# Patient Record
Sex: Male | Born: 1960 | Race: White | Hispanic: No | Marital: Married | Smoking: Never smoker
Health system: Southern US, Community
[De-identification: ages and names within clinical notes are randomized; demographics above are authoritative.]

## PROBLEM LIST (undated history)

## (undated) DIAGNOSIS — I2119 ST elevation (STEMI) myocardial infarction involving other coronary artery of inferior wall: Secondary | ICD-10-CM

## (undated) DIAGNOSIS — I2510 Atherosclerotic heart disease of native coronary artery without angina pectoris: Secondary | ICD-10-CM

## (undated) DIAGNOSIS — I10 Essential (primary) hypertension: Secondary | ICD-10-CM

## (undated) DIAGNOSIS — E785 Hyperlipidemia, unspecified: Secondary | ICD-10-CM

## (undated) DIAGNOSIS — E781 Pure hyperglyceridemia: Secondary | ICD-10-CM

## (undated) DIAGNOSIS — G510 Bell's palsy: Secondary | ICD-10-CM

## (undated) DIAGNOSIS — E118 Type 2 diabetes mellitus with unspecified complications: Secondary | ICD-10-CM

## (undated) DIAGNOSIS — Z9861 Coronary angioplasty status: Secondary | ICD-10-CM

## (undated) DIAGNOSIS — Q230 Congenital stenosis of aortic valve: Secondary | ICD-10-CM

## (undated) DIAGNOSIS — Q231 Congenital insufficiency of aortic valve: Secondary | ICD-10-CM

## (undated) HISTORY — DX: Type 2 diabetes mellitus with unspecified complications: E11.8

## (undated) HISTORY — PX: TRANSTHORACIC ECHOCARDIOGRAM: SHX275

## (undated) HISTORY — DX: Coronary angioplasty status: Z98.61

## (undated) HISTORY — DX: Atherosclerotic heart disease of native coronary artery without angina pectoris: I25.10

## (undated) HISTORY — DX: ST elevation (STEMI) myocardial infarction involving other coronary artery of inferior wall: I21.19

## (undated) HISTORY — PX: KNEE ARTHROSCOPY W/ ACL RECONSTRUCTION: SHX1858

---

## 2009-11-25 ENCOUNTER — Emergency Department (HOSPITAL_COMMUNITY): Admission: EM | Admit: 2009-11-25 | Discharge: 2009-11-25 | Payer: Self-pay | Admitting: Emergency Medicine

## 2014-04-05 ENCOUNTER — Ambulatory Visit (INDEPENDENT_AMBULATORY_CARE_PROVIDER_SITE_OTHER): Payer: Self-pay | Admitting: Internal Medicine

## 2014-04-05 DIAGNOSIS — Z9189 Other specified personal risk factors, not elsewhere classified: Secondary | ICD-10-CM

## 2014-04-05 DIAGNOSIS — A09 Infectious gastroenteritis and colitis, unspecified: Secondary | ICD-10-CM

## 2014-04-05 DIAGNOSIS — Z23 Encounter for immunization: Secondary | ICD-10-CM

## 2014-04-05 MED ORDER — ATOVAQUONE-PROGUANIL HCL 250-100 MG PO TABS: 1.0000 | ORAL_TABLET | Freq: Every day | ORAL | Status: DC

## 2014-04-05 MED ORDER — AZITHROMYCIN 500 MG PO TABS: 500.0000 mg | ORAL_TABLET | Freq: Every day | ORAL | Status: DC

## 2014-04-05 NOTE — Progress Notes (Signed)
   Subjective:    Patient ID: Dennis Bates, male    DOB: 11-Nov-1960, 53 y.o.   MRN: [id number]  HPI 53yo M who has recent diagnosis of diabetes and hypertriglyceridemia is going on a mission trip from July 31 to aug 11th to [place], in [place] building chapel and providing ? Medical care in village. Sleeping on house boat in hammock with mosquito netting.  Previous travel [place] in [place], has had hep a and hep b in the past, but no flu vaccine in the last year. Roughly 6250yrs since tetanus  Meds: metformin Pmhx:  Dm, hyper TG, heart murmur, and knee surgeries   Review of Systems     Objective:   Physical Exam Not needed for visit     Assessment & Plan:  Pre-travel vaccine =  Will give yellow fever, tetanus and typhoid vaccine  Malaria proph = gave rx for malarone #21 and instructions for mosquito repellant  Traveler's diarrhea = gave precautions and rx for azithromycin if needed

## 2014-09-13 DIAGNOSIS — Q230 Congenital stenosis of aortic valve: Secondary | ICD-10-CM

## 2014-09-13 DIAGNOSIS — I350 Nonrheumatic aortic (valve) stenosis: Secondary | ICD-10-CM

## 2014-09-13 HISTORY — DX: Congenital stenosis of aortic valve: Q23.0

## 2014-09-13 HISTORY — DX: Nonrheumatic aortic (valve) stenosis: I35.0

## 2014-09-20 ENCOUNTER — Other Ambulatory Visit: Payer: Self-pay

## 2014-09-20 ENCOUNTER — Inpatient Hospital Stay (HOSPITAL_COMMUNITY)
Admission: EM | Admit: 2014-09-20 | Discharge: 2014-09-23 | DRG: 247 | Disposition: A | Discharge status: Home / Self Care | Payer: Self-pay | Source: Ambulatory Visit | Attending: Cardiology | Admitting: Cardiology

## 2014-09-20 ENCOUNTER — Encounter (HOSPITAL_COMMUNITY): Payer: Self-pay | Admitting: Physician Assistant

## 2014-09-20 ENCOUNTER — Encounter (HOSPITAL_COMMUNITY)
Admission: EM | Disposition: A | Discharge status: Home / Self Care | Payer: MEDICAID | Source: Ambulatory Visit | Attending: Cardiology

## 2014-09-20 DIAGNOSIS — I2119 ST elevation (STEMI) myocardial infarction involving other coronary artery of inferior wall: Principal | ICD-10-CM | POA: Diagnosis present

## 2014-09-20 DIAGNOSIS — I350 Nonrheumatic aortic (valve) stenosis: Secondary | ICD-10-CM | POA: Diagnosis present

## 2014-09-20 DIAGNOSIS — E785 Hyperlipidemia, unspecified: Secondary | ICD-10-CM | POA: Diagnosis present

## 2014-09-20 DIAGNOSIS — E1165 Type 2 diabetes mellitus with hyperglycemia: Secondary | ICD-10-CM | POA: Diagnosis present

## 2014-09-20 DIAGNOSIS — E118 Type 2 diabetes mellitus with unspecified complications: Secondary | ICD-10-CM

## 2014-09-20 DIAGNOSIS — I2510 Atherosclerotic heart disease of native coronary artery without angina pectoris: Secondary | ICD-10-CM | POA: Diagnosis present

## 2014-09-20 DIAGNOSIS — I213 ST elevation (STEMI) myocardial infarction of unspecified site: Secondary | ICD-10-CM

## 2014-09-20 DIAGNOSIS — E119 Type 2 diabetes mellitus without complications: Secondary | ICD-10-CM | POA: Diagnosis present

## 2014-09-20 DIAGNOSIS — E1169 Type 2 diabetes mellitus with other specified complication: Secondary | ICD-10-CM | POA: Diagnosis present

## 2014-09-20 DIAGNOSIS — Q230 Congenital stenosis of aortic valve: Secondary | ICD-10-CM

## 2014-09-20 DIAGNOSIS — Z8679 Personal history of other diseases of the circulatory system: Secondary | ICD-10-CM

## 2014-09-20 DIAGNOSIS — I2121 ST elevation (STEMI) myocardial infarction involving left circumflex coronary artery: Secondary | ICD-10-CM

## 2014-09-20 DIAGNOSIS — I359 Nonrheumatic aortic valve disorder, unspecified: Secondary | ICD-10-CM

## 2014-09-20 DIAGNOSIS — Z9861 Coronary angioplasty status: Secondary | ICD-10-CM

## 2014-09-20 DIAGNOSIS — Q231 Congenital insufficiency of aortic valve: Secondary | ICD-10-CM

## 2014-09-20 DIAGNOSIS — E781 Pure hyperglyceridemia: Secondary | ICD-10-CM | POA: Diagnosis present

## 2014-09-20 DIAGNOSIS — Q2381 Bicuspid aortic valve: Secondary | ICD-10-CM

## 2014-09-20 HISTORY — DX: Hyperlipidemia, unspecified: E78.5

## 2014-09-20 HISTORY — PX: PERCUTANEOUS CORONARY STENT INTERVENTION (PCI-S): SHX6016

## 2014-09-20 HISTORY — PX: LEFT HEART CATHETERIZATION WITH CORONARY ANGIOGRAM: SHX5451

## 2014-09-20 HISTORY — DX: Coronary angioplasty status: Z98.61

## 2014-09-20 HISTORY — DX: Bell's palsy: G51.0

## 2014-09-20 HISTORY — DX: Atherosclerotic heart disease of native coronary artery without angina pectoris: I25.10

## 2014-09-20 HISTORY — DX: Congenital stenosis of aortic valve: Q23.0

## 2014-09-20 HISTORY — DX: Pure hyperglyceridemia: E78.1

## 2014-09-20 HISTORY — DX: ST elevation (STEMI) myocardial infarction involving other coronary artery of inferior wall: I21.19

## 2014-09-20 HISTORY — DX: Congenital insufficiency of aortic valve: Q23.1

## 2014-09-20 LAB — COMPREHENSIVE METABOLIC PANEL
ALT: 35 U/L (ref 0–53)
AST: 42 U/L — ABNORMAL HIGH (ref 0–37)
Albumin: 4.1 g/dL (ref 3.5–5.2)
Alkaline Phosphatase: 35 U/L — ABNORMAL LOW (ref 39–117)
Anion gap: 12 (ref 5–15)
BUN: 7 mg/dL (ref 6–23)
CO2: 19 mmol/L (ref 19–32)
Calcium: 9 mg/dL (ref 8.4–10.5)
Chloride: 100 mEq/L (ref 96–112)
Creatinine, Ser: 0.85 mg/dL (ref 0.50–1.35)
GFR calc Af Amer: >90 mL/min (ref >90)
GFR calc non Af Amer: >90 mL/min (ref >90)
Glucose, Bld: 258 mg/dL — ABNORMAL HIGH (ref 70–99)
Potassium: 3.3 mmol/L — ABNORMAL LOW (ref 3.5–5.1)
Sodium: 131 mmol/L — ABNORMAL LOW (ref 135–145)
Total Bilirubin: 1.4 mg/dL — ABNORMAL HIGH (ref 0.3–1.2)
Total Protein: 6.7 g/dL (ref 6.0–8.3)

## 2014-09-20 LAB — HEMOGLOBIN A1C
Hgb A1c MFr Bld: 9.1 % — ABNORMAL HIGH (ref <5.7)
Mean Plasma Glucose: 214 mg/dL — ABNORMAL HIGH (ref <117)

## 2014-09-20 LAB — LIPID PANEL
Cholesterol: 214 mg/dL — ABNORMAL HIGH (ref 0–200)
HDL: 28 mg/dL — ABNORMAL LOW (ref >39)
LDL Cholesterol: UNDETERMINED mg/dL (ref 0–99)
Total CHOL/HDL Ratio: 7.6 RATIO
Triglycerides: 879 mg/dL — ABNORMAL HIGH (ref <150)
VLDL: UNDETERMINED mg/dL (ref 0–40)

## 2014-09-20 LAB — PROTIME-INR
INR: 1.06 (ref 0.00–1.49)
Prothrombin Time: 13.9 seconds (ref 11.6–15.2)

## 2014-09-20 LAB — CBC
HCT: 39.4 % (ref 39.0–52.0)
Hemoglobin: 14.4 g/dL (ref 13.0–17.0)
MCH: 29.9 pg (ref 26.0–34.0)
MCHC: 36.5 g/dL — ABNORMAL HIGH (ref 30.0–36.0)
MCV: 81.7 fL (ref 78.0–100.0)
Platelets: 239 10*3/uL (ref 150–400)
RBC: 4.82 MIL/uL (ref 4.22–5.81)
RDW: 12.5 % (ref 11.5–15.5)
WBC: 9.7 10*3/uL (ref 4.0–10.5)

## 2014-09-20 LAB — POCT I-STAT, CHEM 8
BUN: 7 mg/dL (ref 6–23)
Calcium, Ion: 1.13 mmol/L (ref 1.12–1.23)
Chloride: 110 mEq/L (ref 96–112)
Creatinine, Ser: 0.6 mg/dL (ref 0.50–1.35)
Glucose, Bld: 279 mg/dL — ABNORMAL HIGH (ref 70–99)
HCT: 41 % (ref 39.0–52.0)
Hemoglobin: 13.9 g/dL (ref 13.0–17.0)
Potassium: 3.4 mmol/L — ABNORMAL LOW (ref 3.5–5.1)
Sodium: 130 mmol/L — ABNORMAL LOW (ref 135–145)
TCO2: 18 mmol/L (ref 0–100)

## 2014-09-20 LAB — CK TOTAL AND CKMB (NOT AT ARMC)
CK, MB: 5.1 ng/mL — ABNORMAL HIGH (ref 0.3–4.0)
Relative Index: INVALID (ref 0.0–2.5)
Total CK: 83 U/L (ref 7–232)

## 2014-09-20 LAB — GLUCOSE, CAPILLARY
Glucose-Capillary: 199 mg/dL — ABNORMAL HIGH (ref 70–99)
Glucose-Capillary: 176 mg/dL — ABNORMAL HIGH (ref 70–99)

## 2014-09-20 LAB — TROPONIN I
Troponin I: 0.08 ng/mL — ABNORMAL HIGH (ref <0.031)
Troponin I: 36.92 ng/mL (ref <0.031)
Troponin I: 29.62 ng/mL (ref <0.031)

## 2014-09-20 LAB — APTT: aPTT: 30 seconds (ref 24–37)

## 2014-09-20 LAB — MRSA PCR SCREENING: MRSA by PCR: NEGATIVE

## 2014-09-20 LAB — TSH: TSH: 3.113 u[IU]/mL (ref 0.350–4.500)

## 2014-09-20 LAB — POCT ACTIVATED CLOTTING TIME: Activated Clotting Time: 454 seconds

## 2014-09-20 SURGERY — LEFT HEART CATHETERIZATION WITH CORONARY ANGIOGRAM
Anesthesia: LOCAL

## 2014-09-20 MED ORDER — MIDAZOLAM HCL 2 MG/2ML IJ SOLN
INTRAMUSCULAR | Status: AC
  Filled 2014-09-20: qty 2

## 2014-09-20 MED ORDER — CARVEDILOL 3.125 MG PO TABS
3.1250 mg | ORAL_TABLET | Freq: Two times a day (BID) | ORAL | Status: DC
  Administered 2014-09-20 – 2014-09-23 (×6): 3.125 mg via ORAL
  Filled 2014-09-20 (×8): qty 1

## 2014-09-20 MED ORDER — PRASUGREL HCL 10 MG PO TABS
ORAL_TABLET | ORAL | Status: AC
  Filled 2014-09-20: qty 6

## 2014-09-20 MED ORDER — HEPARIN (PORCINE) IN NACL 2-0.9 UNIT/ML-% IJ SOLN
INTRAMUSCULAR | Status: AC
  Filled 2014-09-20: qty 1500

## 2014-09-20 MED ORDER — FENTANYL CITRATE 0.05 MG/ML IJ SOLN
INTRAMUSCULAR | Status: AC
  Filled 2014-09-20: qty 2

## 2014-09-20 MED ORDER — PRASUGREL HCL 10 MG PO TABS
10.0000 mg | ORAL_TABLET | Freq: Every day | ORAL | Status: DC
  Administered 2014-09-21 – 2014-09-23 (×3): 10 mg via ORAL
  Filled 2014-09-20 (×3): qty 1

## 2014-09-20 MED ORDER — INSULIN ASPART 100 UNIT/ML ~~LOC~~ SOLN: 0.0000 [IU] | Freq: Three times a day (TID) | SUBCUTANEOUS | Status: DC

## 2014-09-20 MED ORDER — HYDRALAZINE HCL 20 MG/ML IJ SOLN
10.0000 mg | INTRAMUSCULAR | Status: DC | PRN
  Administered 2014-09-20: 10 mg via INTRAVENOUS
  Filled 2014-09-20: qty 1

## 2014-09-20 MED ORDER — NITROGLYCERIN 1 MG/10 ML FOR IR/CATH LAB
INTRA_ARTERIAL | Status: AC
  Filled 2014-09-20: qty 10

## 2014-09-20 MED ORDER — ONDANSETRON HCL 4 MG/2ML IJ SOLN: 4.0000 mg | Freq: Four times a day (QID) | INTRAMUSCULAR | Status: DC | PRN

## 2014-09-20 MED ORDER — FENOFIBRATE 160 MG PO TABS
160.0000 mg | ORAL_TABLET | Freq: Every day | ORAL | Status: DC
  Filled 2014-09-20: qty 1

## 2014-09-20 MED ORDER — ASPIRIN 300 MG RE SUPP
300.0000 mg | RECTAL | Status: AC
  Filled 2014-09-20: qty 1

## 2014-09-20 MED ORDER — NITROGLYCERIN 0.4 MG SL SUBL: 0.4000 mg | SUBLINGUAL_TABLET | SUBLINGUAL | Status: DC | PRN

## 2014-09-20 MED ORDER — ASPIRIN EC 81 MG PO TBEC
81.0000 mg | DELAYED_RELEASE_TABLET | Freq: Every day | ORAL | Status: DC
  Administered 2014-09-21 – 2014-09-23 (×3): 81 mg via ORAL
  Filled 2014-09-20 (×3): qty 1

## 2014-09-20 MED ORDER — SODIUM CHLORIDE 0.9 % IV SOLN: 250.0000 mL | INTRAVENOUS | Status: DC | PRN

## 2014-09-20 MED ORDER — BIVALIRUDIN 250 MG IV SOLR
INTRAVENOUS | Status: AC
  Filled 2014-09-20: qty 250

## 2014-09-20 MED ORDER — LIDOCAINE HCL (PF) 1 % IJ SOLN
INTRAMUSCULAR | Status: AC
  Filled 2014-09-20: qty 30

## 2014-09-20 MED ORDER — INSULIN ASPART 100 UNIT/ML ~~LOC~~ SOLN
0.0000 [IU] | Freq: Three times a day (TID) | SUBCUTANEOUS | Status: DC
  Administered 2014-09-21 (×2): 5 [IU] via SUBCUTANEOUS
  Administered 2014-09-22: 3 [IU] via SUBCUTANEOUS
  Administered 2014-09-22: 4 [IU] via SUBCUTANEOUS
  Administered 2014-09-22: 5 [IU] via SUBCUTANEOUS
  Administered 2014-09-23: 3 [IU] via SUBCUTANEOUS
  Administered 2014-09-23: 5 [IU] via SUBCUTANEOUS

## 2014-09-20 MED ORDER — MORPHINE SULFATE 2 MG/ML IJ SOLN: 2.0000 mg | INTRAMUSCULAR | Status: DC | PRN

## 2014-09-20 MED ORDER — ASPIRIN 81 MG PO CHEW: 324.0000 mg | CHEWABLE_TABLET | ORAL | Status: AC

## 2014-09-20 MED ORDER — SODIUM CHLORIDE 0.9 % IJ SOLN: 3.0000 mL | INTRAMUSCULAR | Status: DC | PRN

## 2014-09-20 MED ORDER — SODIUM CHLORIDE 0.9 % IJ SOLN
3.0000 mL | Freq: Two times a day (BID) | INTRAMUSCULAR | Status: DC
  Administered 2014-09-20 – 2014-09-23 (×6): 3 mL via INTRAVENOUS

## 2014-09-20 MED ORDER — ACETAMINOPHEN 325 MG PO TABS: 650.0000 mg | ORAL_TABLET | ORAL | Status: DC | PRN

## 2014-09-20 MED ORDER — SODIUM CHLORIDE 0.9 % IV SOLN: INTRAVENOUS | Status: DC

## 2014-09-20 MED ORDER — FENOFIBRATE 160 MG PO TABS
160.0000 mg | ORAL_TABLET | Freq: Every day | ORAL | Status: DC
  Administered 2014-09-20 – 2014-09-23 (×4): 160 mg via ORAL
  Filled 2014-09-20 (×4): qty 1

## 2014-09-20 MED ORDER — ROSUVASTATIN CALCIUM 40 MG PO TABS
40.0000 mg | ORAL_TABLET | Freq: Every day | ORAL | Status: DC
  Administered 2014-09-20 – 2014-09-22 (×3): 40 mg via ORAL
  Filled 2014-09-20 (×4): qty 1

## 2014-09-20 NOTE — Brief Op Note (Addendum)
   09/20/2014  12:49 PM  PATIENT:  Dennis Bates  54 y.o. male with DM-2 & Hypertriglyceridemia & AoValve murmur with several days of feeling poorly.  This AM noted SSCP, dyspnea, & L arm numbness --> @ PCP (Dr. Ula Lingo) office this AM. --> EKG with ~2 mm STE in Inferior & Lateral Leads.  EMS EKG 1030 AM.  CODE STEMI CALLED.  1114 arrival to CATH LAB  PRE-OPERATIVE DIAGNOSIS:  Inferolateral STEMI  POST-OPERATIVE DIAGNOSIS:  Mid Cx-OM2 99% subtotatl thrombotic occlusion - s/p DES PCI after thrombectomy aspiration. - Promus DES 3.0 mm x 16 mm (3.5 mm)  PROCEDURE:  Procedure(s): LEFT HEART CATHETERIZATION WITH CORONARY ANGIOGRAM (N/A) (CATHETER PLACEMENT FOR CORONARY ANGIOGRAPHY - UNABLE TO CROSS Ao VALVE.  R RADIAL - 6 FR --> TIG 4.O (LCA), JR4 (RCA);  POST PCI - EZ RAD R RCA  Unable to cross Ao Valve with multiple catheters - Angled Pig, JR4, TIG 4.0, AL1 PCI OF midOM2 - Promus DES 3.0 mm x 16 mm (3.5 mm)  6 Fr L CLS3.5 GUIDE --> BMW wire  Pronto Aspiration Thrombecromy - 1 run, 2 tubular red clots (1149)  Pre-dil: Emerge 2.5 mm x 12 mm --> 10 Atm x 30 Sec  Stent: Promus Premier DES 3.0 mm x 16 mm --> 20 Atm x 30 Sec  Post-dil: [place] Euphora 3.5 mm x 12 mm -- 16 Atm x 45 Sec (3.5 mm)  SURGEON:  Surgeon(s) and Role:    * Leonie Man, MD - Primary  ANESTHESIA:   local and IV sedation 69m Versed, 50 mcg Fentanyl  EBL:    ~20 mL including blood sample for labs.  MEDICATIONS:   ANGIOMAX BOLUS & GTT  EFFIENT 60 MG  IC NTG 200 MCG Radial Cocktail: 5 mg Verapamil, 400 mcg NTG, 2 ml 2% Lidocaine in 10 ml NS Omnipague Contrast 235 ml  SPECIMEN:  Source of Specimen:  Arterial Access - blood for Labs & ACT  DISPOSITION OF SPECIMEN:  Lab  TOURNIQUET:  TR Band 13 mL air, 1250 hrs  DICTATION: .Note written in EPIC  PLAN OF CARE: Admit to inpatient  - fast track for d/s  DAPT  2D ECHO TO ASSESS EF & AORTIC VALVE  Needs FLP - reportedly has high TG - fenofibrate ordered  instead of Statin pending results, but would add crestor as well if possible pre-d/c/  PATIENT DISPOSITION:  ICU - extubated and stable.    HLeonie Man M.D., M.S. Interventional Cardiologist   Pager # [phone]

## 2014-09-20 NOTE — Progress Notes (Signed)
CRITICAL VALUE ALERT  Critical value received:  Troponin 36.92  Date of notification:  09/20/14  Time of notification:  1730   Critical value read back:Yes.    Nurse who received alert:  Shanti Agresti  Pt. Was code Stemi. Expected lab value.

## 2014-09-20 NOTE — CV Procedure (Signed)
CARDIAC CATHETERIZATION AND PERCUTANEOUS CORONARY INTERVENTION REPORT  NAME:  Dennis Bates   MRN: [id number] DOB:  1961/02/25   ADMIT DATE: 09/20/2014 Procedure Date: 09/20/2014  INTERVENTIONAL CARDIOLOGIST: Leonie Man, M.D., MS PRIMARY CARE PROVIDER: No primary care provider on file. PRIMARY CARDIOLOGIST: He Is New to [place].  PATIENT:  Dennis Bates is a 54 y.o. male with DM-2 & Hypertriglyceridemia & AoValve murmur with several days of feeling poorly. This AM noted SSCP, dyspnea, & L arm numbness --> @ PCP (Dr. Ula Lingo) office this AM. --> EKG with ~2 mm STE in Inferior & Lateral Leads. EMS EKG 1030 AM. CODE STEMI CALLED.  1114 arrival to CATH LAB  PRE-OPERATIVE DIAGNOSIS:    Acute Inferolateral ST Elevation MI  PROCEDURES PERFORMED:    Attempted, but but unsuccessful Left Heart Catheterization with catheter placement for Native Coronary  Angiography  via Right Radial Artery   Aspiration Thrombectomy of Mid Circumflex-OM 2  Percutaneous Coronary Intervention of Mid Circumflex-OM 2 subtotal thrombotic occlusion with placement of a 3.0 mm x 16 mm progress premier DES stent postdilated to 3.5 mm.  PROCEDURE: The patient was brought to the [facility] [place] Emergently via EMS. He was prepped and draped in the usual sterile fashion for RIGHT RADIAL artery access. A modified Allen's test was performed on the right wrist demonstrating excellent collateral flow for radial access.   Sterile technique was used including antiseptics, cap, gloves, gown, hand hygiene, mask and sheet. Skin prep: Chlorhexidine.   Consent: Risks of procedure as well as the alternatives and risks of each were explained to the (patient/caregiver). Emergency vertebral Consent for procedure obtained.   Time Out: Verified patient identification, verified procedure, site/side was marked, verified correct patient position, special equipment/implants available,  medications/allergies/relevent history reviewed, required imaging and test results available. Performed.  Access:   RIGHT RADIAL Artery: 6 Fr Sheath -  Seldinger Technique (Angiocath Micropuncture Kit)  Radial Cocktail - 10 mL;  20 ml blood sample for POC labs & sent to Lab for formal lab draw upon obtaining access.  Left Heart Catheterization: 5 Fr Catheters advanced or exchanged over a long exchange safety J-wire; TIG 4.0 catheter advanced first.  Left Coronary Artery Cineangiography: TIG 4.0 Catheter  Right Coronary Artery, SVG-RCA & SVG-OM Cineangiography: TIG 4.0 Catheter  (post PCI, an EZ Rad R guide catheter was used to better engage the RCA)  LV Hemodynamics (LV Gram): Deferred; after multiple attempts to cross the Aortic Valve using several catheters (JR4, EZ Rad R, Angled Pigtail, & AL1) , decision was made to abort this portion of the procedure and simply obtain an echocardiogram to better assess the aortic valve.  Sheath removed in the cardiac catheterization lab with TR Band placement for hemostasis.  TR Band: 1250 hours; 13 mL air  FINDINGS:  Hemodynamics:   Central Aortic Pressure: 139/90 mmHg  Left Ventriculography: not assessed  Coronary Anatomy:  Dominance: right  Left Main: large caliber vessel it bifurcates into the LAD and circumflex. Angiographically normal. LAD: moderate caliber vessel with proximal/ostial 20% stenosis. Gives rise to a very proximal septal perforator. The vessel itself was relatively free of disease as it coursed around the apex.  D1: small caliber vessel with a mid LAD. Angiographically normal.  Left Circumflex: our chart, nondominant vessel it gives rise to a proximal OM1 that is moderate in caliber. Vessel terminates as a large lateral OM2 that has a focal 99% subtotal thrombotic occlusion by a hazy filling defects  just distal to this lesion suggestive of thrombus. There was TIMI 1 flow distally beyond the thrombus..   RCA: normal  caliber, dominant vessel with mild irregularities.It Bifurcates Distally into the Right Posterior Descending Artery (RPDA) and the  Right Posterior AV Groove Branch (RPAV).  RPDA: moderate caliber vessel that reaches the apex. Mild luminal irregularities.  RPL Sysytem:The RPAV because of moderate caliber vessel that terminates as one major posterolateral branch and several smaller branches occluded AV nodal artery. Angiographically normal.  After reviewing the initial angiography, the culprit lesion was thought to be subtotal occlusion of the Mid Circumflex-OM 2.  Preparation were made to proceed with PCI on this lesion. Due to the distal thrombus burden, I decided to initially perform thrombectomy as the initial invasive procedure.  Percutaneous Coronary Intervention:   Guide: 6 Fr   CLS 3.5  Guidewire: BMW Predilation Balloon:  Emerge 2.5  mm x 12 mm;   10 Atm x 30 Sec Stent: Promus Premier DES 3.0 mm x 16 mm;   14 Atm x 30 Sec, 16 Atm x 30 Sec Post-dilation Balloon: [place] Euphora 3.5 mm x 12 mm;   16 Atm x 45 Sec, Final Diameter: 3.5 mm  Post deployment angiography in multiple views, with and without guidewire in place revealed excellent stent deployment and lesion coverage.  There was no evidence of dissection or perforation.  MEDICATIONS:  Anesthesia:  Local Lidocaine 2 ml  Sedation:  1 mg IV Versed, 50 mcg IV fentanyl ;  Omnipaque Contrast: 235 ml  Anticoagulation:  Angiomax Bolus & drip  Anti-Platelet Agent:  Efient 60 mg  Radial Cocktail: 5 mg Verapamil, 400 mcg NTG, 2 ml 2% Lidocaine in 10 ml NS  IC NTG 200 mcg  PATIENT DISPOSITION:    The patient was transferred to the PACU holding area in a hemodynamicaly stable, chest pain free condition.  The patient tolerated the procedure well, and there were no complications.  EBL:   < 20 ml  The patient was stable before, during, and after the procedure.  POST-OPERATIVE DIAGNOSIS:    Severe single-vessel disease with Mid  Circumflex-OM 2 with 95% subtotal thrombotic occlusion successfully treated with a single posterior DES stent.  Presumptive diagnosis of aortic valve disease based on the patient reporting history of murmur involving aortic valve. Unsure of the true etiology.  Patient also reports having hypertriglyceridemia with very high levels of triglycerides.  PLAN OF CARE:  Admit to PCU for step down care post uncomplicated PCI  For inferior STEMI  DAPT x minimum of 1 year  2 D Echo  Start Fenofibrate + Crestor & check FLP -   Add BB - Coreg 3.125 mg bid  Anticipate FastTrack dischage in 2-3 days post PCI.    Leonie Man, M.D., M.S. Interventional Cardiologist   Pager # [phone]

## 2014-09-20 NOTE — Care Management Note (Addendum)
    Page 1 of 1   09/23/2014     2:11:29 PM CARE MANAGEMENT NOTE 09/23/2014  Patient:  Dennis Bates   Account Number:  [id number]  Date Initiated:  09/20/2014  Documentation initiated by:  Junius CreamerWELL,DEBBIE  Subjective/Objective Assessment:   adm w mi     Action/Plan:   lives w wife, pt does have pcp he pays privately for. no ins at present   Anticipated DC Date:  09/23/2014   Anticipated DC Plan:  HOME/SELF CARE      DC Planning Services  CM consult  Medication Assistance      Choice offered to / List presented to:             Status of service:  Completed, signed off Medicare Important Message given?   (If response is "NO", the following Medicare IM given date fields will be blank) Date Medicare IM given:   Medicare IM given by:   Date Additional Medicare IM given:   Additional Medicare IM given by:    Discharge Disposition:  HOME/SELF CARE  Per UR Regulation:  Reviewed for med. necessity/level of care/duration of stay  If discussed at Long Length of Stay Meetings, dates discussed:    Comments:  09/23/14- 1230- Donn PieriniKristi Roneshia Drew RN, BSN [phone] Pt for d/c today- has [employer] card for free 30 day- pt assistance paperwork signed by PA for [employer]- and given to pt to f/u and fax when he has his tax info- also gave free 30 day voucher for [place]- pt states that he has been on this before but it was when he had insurance- advised him to f/u with pharmacy to see what out of pocket cost this might be for him and then to speak with his PCP about any pt assist that might be offered with drug company if needed. Other meds should be low cost and pt states that he should be able to afford.  1/8 1621 debbie dowell rn,bsn spoke w pt. no ins at present. gave pt 30day free effient card. wife not sure they will qualify for pt assist program but enc them to try. left pt assist form on shadow chart.

## 2014-09-20 NOTE — Progress Notes (Signed)
Angiomax completed and turned off. 

## 2014-09-20 NOTE — Progress Notes (Signed)
  Echocardiogram 2D Echocardiogram has been performed.  Genova Kiner 09/20/2014, 4:00 PM

## 2014-09-20 NOTE — H&P (Addendum)
Patient ID: Dennis Bates MRN: [id number], DOB/AGE: [id number]   Admit date: 09/20/2014   Primary Physician: No primary care provider on file. Primary Cardiologist: New  Pt. Profile:  54 yo male with h/o hyperlipidemia / hypertriglyceridemia and DM-2 came in with chest pain and overall feeling poorly for several days worsened this morning -- transfer from PCPs office for inferolateral STEMI  Problem List  Past Medical History  Diagnosis Date  . Hyperlipidemia   . Diabetes   . Aortic valve disorder   . Cardiac murmur   . Bell's palsy     Past Surgical History  Procedure Laterality Date  . Cardiac catheterization      remote h/o cath >15 yrs ago, unkown  . Doppler echocardiography       Allergies  Allergies no known allergies  HPI  Mr Dennis Bates is 54 yo male with h/o hyperlipidemia, aortic valve disorder, Bell's Palsy and DM who has noticed several days of intermittent chest pain. He never smoked and per family, he has not had any illicit drug use history. The chest pain returned this morning around 8:30am prompting the patient to seek medical attention at his PCP's office. EKG obtained in his PCP's office was concerning for ST elevation in inferolateral leads. EMS was called and [facility] emergent cardiac catheter[facility].   Although previous undiagnosed, he likely has HTN as well. Due to emergent nature of the case, FHx, surgical hx and social hx was obtained from family.  Home Medications  Prior to Admission medications   Medication Sig Start Date End Date Taking? Authorizing Provider  atovaquone-proguanil (MALARONE) 250-100 MG TABS Take 1 tablet by mouth daily. Start taking on July 29th, take daily until complete 04/05/14   Judyann Munsonynthia Snider, MD  azithromycin (ZITHROMAX) 500 MG tablet Take 1 tablet (500 mg total) by mouth daily. If needed for 3 loose stools or more,  Can stop taking if diarrhea resolves 04/05/14   Judyann Munsonynthia Snider, MD  metFORMIN  (GLUCOPHAGE) 1000 MG tablet  03/27/14   Historical Provider, MD  TRADJENTA 5 MG TABS tablet  01/24/14   Historical Provider, MD    Family History  Family History  Problem Relation Age of Onset  . CAD Neg Hx     per wife    Social History  History   Social History  . Marital Status: Married    Spouse Name: N/A    Number of Children: N/A  . Years of Education: N/A   Occupational History  . Not on file.   Social History Main Topics  . Smoking status: Never Smoker   . Smokeless tobacco: Not on file  . Alcohol Use: No  . Drug Use: No  . Sexual Activity: Not on file   Other Topics Concern  . Not on file   Social History Narrative  . No narrative on file     Review of Systems General:  No chills, fever, night sweats or weight changes.  Cardiovascular:  No dyspnea on exertion, edema, orthopnea, palpitations, paroxysmal nocturnal dyspnea. + chest pain Dermatological: No rash, lesions/masses Respiratory: No cough, dyspnea Urologic: No hematuria, dysuria Abdominal:   No nausea, vomiting, diarrhea, bright red blood per rectum, melena, or hematemesis Neurologic:  No visual changes, wkns, changes in mental status. All other systems reviewed and are otherwise negative except as noted above.  Physical Exam  There were no vitals taken for this visit.  General: Pleasant, NAD Psych: Normal affect. Neuro: Alert and oriented X  3. Moves all extremities spontaneously. HEENT: Normal  Neck: Supple without bruits or JVD. Lungs:  Resp regular and unlabored, CTA. Heart: RRR no s3, s4, 2/6 SEM at RUSB radiating to bilateral carotids. Carotid upstroke is normal bilaterally.. Abdomen: Soft, non-tender, non-distended, BS + x 4.  Extremities: No clubbing, cyanosis or edema. DP/PT/Radials 2+ and equal bilaterally.  Labs  Troponin [facility] of Care Test)  Lab Results  Component Value Date   WBC 9.7 09/20/2014   HGB 14.4 09/20/2014   HCT 39.4 09/20/2014   MCV 81.7 09/20/2014   PLT 239  09/20/2014     Radiology/Studies  No results found.  ECG  Inferolateral STEMI - 1-2 mm elevations in leads 23 aVF as well as V4 and V5   ASSESSMENT AND PLAN  1. Inferolateral MI - currently at 2 out of 10 chest pain upon arrival to the Cath Lab at 1114  - per wife, remote cath > 15 yrs ago, unkown anatomy  - for emergent cath  2. Aortic valve disorder  - obtain echocardiogram - unable to cross valve on cardiac catheterization. Murmur does not sound like valve stenosis is significant.  3. HLD - check fasting lipid panel. With history of hyperglyceridemia, will start fenofibrate. Will additionally use Crestor for better reaction. 4. DM -- we'll need to cover with sliding scale and then convert back to oral medications in 48 hours 5. H/o Bell's palsy   Further plans post PCI for dual antiplatelet therapy.  Ramond Dial, PA-C 09/20/2014, 12:38 PM

## 2014-09-21 DIAGNOSIS — I2119 ST elevation (STEMI) myocardial infarction involving other coronary artery of inferior wall: Principal | ICD-10-CM

## 2014-09-21 DIAGNOSIS — E1169 Type 2 diabetes mellitus with other specified complication: Secondary | ICD-10-CM | POA: Diagnosis present

## 2014-09-21 DIAGNOSIS — E1165 Type 2 diabetes mellitus with hyperglycemia: Secondary | ICD-10-CM

## 2014-09-21 DIAGNOSIS — E119 Type 2 diabetes mellitus without complications: Secondary | ICD-10-CM | POA: Diagnosis present

## 2014-09-21 DIAGNOSIS — E785 Hyperlipidemia, unspecified: Secondary | ICD-10-CM

## 2014-09-21 DIAGNOSIS — Q231 Congenital insufficiency of aortic valve: Secondary | ICD-10-CM

## 2014-09-21 DIAGNOSIS — I350 Nonrheumatic aortic (valve) stenosis: Secondary | ICD-10-CM

## 2014-09-21 DIAGNOSIS — Q230 Congenital stenosis of aortic valve: Secondary | ICD-10-CM

## 2014-09-21 LAB — LIPID PANEL
Cholesterol: 194 mg/dL (ref 0–200)
HDL: 27 mg/dL — ABNORMAL LOW (ref >39)
LDL Cholesterol: UNDETERMINED mg/dL (ref 0–99)
Total CHOL/HDL Ratio: 7.2 RATIO
Triglycerides: 924 mg/dL — ABNORMAL HIGH (ref <150)
VLDL: UNDETERMINED mg/dL (ref 0–40)

## 2014-09-21 LAB — GLUCOSE, CAPILLARY
Glucose-Capillary: 225 mg/dL — ABNORMAL HIGH (ref 70–99)
Glucose-Capillary: 212 mg/dL — ABNORMAL HIGH (ref 70–99)
Glucose-Capillary: 217 mg/dL — ABNORMAL HIGH (ref 70–99)
Glucose-Capillary: 225 mg/dL — ABNORMAL HIGH (ref 70–99)
Glucose-Capillary: 198 mg/dL — ABNORMAL HIGH (ref 70–99)

## 2014-09-21 LAB — BASIC METABOLIC PANEL
Anion gap: 12 (ref 5–15)
BUN: 8 mg/dL (ref 6–23)
CO2: 20 mmol/L (ref 19–32)
Calcium: 9.2 mg/dL (ref 8.4–10.5)
Chloride: 100 mEq/L (ref 96–112)
Creatinine, Ser: 0.84 mg/dL (ref 0.50–1.35)
GFR calc Af Amer: >90 mL/min (ref >90)
GFR calc non Af Amer: >90 mL/min (ref >90)
Glucose, Bld: 218 mg/dL — ABNORMAL HIGH (ref 70–99)
Potassium: 3.5 mmol/L (ref 3.5–5.1)
Sodium: 132 mmol/L — ABNORMAL LOW (ref 135–145)

## 2014-09-21 LAB — CBC
HCT: 39.3 % (ref 39.0–52.0)
Hemoglobin: 13.8 g/dL (ref 13.0–17.0)
MCH: 29.3 pg (ref 26.0–34.0)
MCHC: 35.1 g/dL (ref 30.0–36.0)
MCV: 83.4 fL (ref 78.0–100.0)
Platelets: 259 10*3/uL (ref 150–400)
RBC: 4.71 MIL/uL (ref 4.22–5.81)
RDW: 12.8 % (ref 11.5–15.5)
WBC: 9.7 10*3/uL (ref 4.0–10.5)

## 2014-09-21 LAB — LDL CHOLESTEROL, DIRECT: Direct LDL: 43 mg/dL

## 2014-09-21 LAB — TROPONIN I: Troponin I: 20.45 ng/mL (ref <0.031)

## 2014-09-21 MED ORDER — GLIPIZIDE ER 5 MG PO TB24
5.0000 mg | ORAL_TABLET | Freq: Every day | ORAL | Status: DC
  Administered 2014-09-21 – 2014-09-23 (×3): 5 mg via ORAL
  Filled 2014-09-21 (×4): qty 1

## 2014-09-21 MED ORDER — LINAGLIPTIN 5 MG PO TABS
5.0000 mg | ORAL_TABLET | Freq: Every day | ORAL | Status: DC
  Administered 2014-09-21 – 2014-09-23 (×3): 5 mg via ORAL
  Filled 2014-09-21 (×4): qty 1

## 2014-09-21 NOTE — Progress Notes (Signed)
Blood sugar 198 at 2100 after eating entire pack of peanut butter crackers. Will recheck in am.

## 2014-09-21 NOTE — Progress Notes (Signed)
Inpatient Diabetes Program Recommendations  AACE/ADA: New Consensus Statement on Inpatient Glycemic Control (2013)  Target Ranges:  Prepandial:   less than 140 mg/dL      Peak postprandial:   less than 180 mg/dL (1-2 hours)      Critically ill patients:  140 - 180 mg/dL   Reason for Assessment:  Referral received. Results for Dennis Bates, Dennis Bates (MRN [id number]) as of 09/21/2014 21:56  Ref. Range 09/21/2014 08:40 09/21/2014 12:00 09/21/2014 17:04 09/21/2014 21:09  Glucose-Capillary Latest Range: 70-99 mg/dL 782212 (H) 956217 (H) 213225 (H) 198 (H)  Results for Dennis Bates, Dennis Bates (MRN [id number]) as of 09/21/2014 21:56  Ref. Range 09/20/2014 11:30  Hgb A1c MFr Bld Latest Range: <5.7 % 9.1 (H)   Diabetes history: Type 2.  Note per MD note that patient has had an recent increase in A1C Outpatient Diabetes medications:  Metformin 500 mg bid, Januvia 100 mg daily Current orders for Inpatient glycemic control:  Novolog moderate tid with meals, Tradjenta 5 mg daily, Glipizide 5 mg daily  Note that CBG's are greater than goal.  Consider adding Lantus 15 units daily while in the hospital.  May need basal insulin at discharge also?  Needs follow-up with PCP.   Thanks, Beryl MeagerJenny Darvell Monteforte, RN, BC-ADM Inpatient Diabetes Coordinator Pager [phone]

## 2014-09-21 NOTE — Progress Notes (Signed)
CARDIAC REHAB PHASE I   PRE:  Rate/Rhythm: 91 SR  BP:  Supine:   Sitting: 126/89  Standing:    SaO2: 98% RA  MODE:  Ambulation: 700 ft   POST:  Rate/Rhythm: 94 SR  BP:  Supine:   Sitting: 108/89  Standing:    SaO2: 98% RA  [id number] Patient tolerated ambulation well without c/o, gait steady, VSS. To chair after walk. Discussed activity progression, endpoints for exercise, CP, NTG use and calling 911 with patient and patient's wife, stent card reviewed. Pt verbalizes understanding of instructions given.  Artist Paislinty M Vianca Bracher, MS, ACSM CCEP

## 2014-09-21 NOTE — Progress Notes (Signed)
DAILY PROGRESS NOTE  Subjective:  S/p LCx territory STEMI yesterday. PCI to the mid-OM2 with a promus premier DES 3.0 x 16 mm, post-dilated to 3.5 mm. Troponin elevated to 36.9 overnight, now trending down to 20.45. During cath, Dr. Ellyn Hack could not cross the aortic valve. Noted to be calcified. Echo yesterday shows likely severe aortic stenosis - mean gradient of 34 mmHg - there is concern for possible bicuspid valve.  There is marked dyslipidemia - Triglycerides are 924, HDL 27, LDL not calculated due to elevated triglycerides. HBa1c is also elevated at 9.1.   Objective:  Temp:  [98.2 F (36.8 C)-98.6 F (37 C)] 98.2 F (36.8 C) (01/09 0842) Pulse Rate:  [70-99] 99 (01/09 0842) Resp:  [10-24] 15 (01/09 0842) BP: (112-153)/(73-111) 112/73 mmHg (01/09 0842) SpO2:  [95 %-99 %] 95 % (01/09 0842) Weight:  [188 lb 7.9 oz (85.5 kg)] 188 lb 7.9 oz (85.5 kg) (01/08 1630) Weight change:   Intake/Output from previous day: 01/08 0701 - 01/09 0700 In: 318.8 [I.V.:318.8] Out: 1940 [Urine:1940]  Intake/Output from this shift:    Medications: Current Facility-Administered Medications  Medication Dose Route Frequency Provider Last Rate Last Dose  . 0.9 %  sodium chloride infusion  250 mL Intravenous PRN Leonie Man, MD 10 mL/hr at 09/20/14 1730 250 mL at 09/20/14 1730  . acetaminophen (TYLENOL) tablet 650 mg  650 mg Oral Q4H PRN Almyra Deforest, PA      . aspirin chewable tablet 324 mg  324 mg Oral NOW Almyra Deforest, PA   324 mg at 09/20/14 1530   Or  . aspirin suppository 300 mg  300 mg Rectal NOW Almyra Deforest, PA      . aspirin EC tablet 81 mg  81 mg Oral Daily Almyra Deforest, [place]      . carvedilol (COREG) tablet 3.125 mg  3.125 mg Oral BID WC Almyra Deforest, PA   3.125 mg at 09/21/14 0816  . fenofibrate tablet 160 mg  160 mg Oral Daily Leonie Man, MD   160 mg at 09/20/14 2107  . hydrALAZINE (APRESOLINE) injection 10 mg  10 mg Intravenous PRN Eileen Stanford, PA-C   10 mg at 09/20/14 2107  . insulin  aspart (novoLOG) injection 0-15 Units  0-15 Units Subcutaneous TID WC Eileen Stanford, PA-C      . morphine 2 MG/ML injection 2 mg  2 mg Intravenous Q1H PRN Leonie Man, MD      . nitroGLYCERIN (NITROSTAT) SL tablet 0.4 mg  0.4 mg Sublingual Q5 Min x 3 PRN Almyra Deforest, PA      . ondansetron [facility]) injection 4 mg  4 mg Intravenous Q6H PRN Almyra Deforest, PA      . prasugrel (EFFIENT) tablet 10 mg  10 mg Oral Daily Leonie Man, MD      . rosuvastatin (CRESTOR) tablet 40 mg  40 mg Oral q1800 Almyra Deforest, [place]   40 mg at 09/20/14 1727  . sodium chloride 0.9 % injection 3 mL  3 mL Intravenous Q12H Leonie Man, MD   3 mL at 09/20/14 2107  . sodium chloride 0.9 % injection 3 mL  3 mL Intravenous PRN Leonie Man, MD        Physical Exam: General appearance: alert and no distress Neck: no carotid bruit and no JVD Lungs: clear to auscultation bilaterally Heart: impulse: apical impulse normal, S1: normal, S2: decreased intensity and systolic murmur: mid to late systolic 3/6, crescendo  at 2nd right intercostal space Abdomen: soft, non-tender; bowel sounds normal; no masses,  no organomegaly Extremities: extremities normal, atraumatic, no cyanosis or edema Pulses: 2+ and symmetric Skin: Skin color, texture, turgor normal. No rashes or lesions Neurologic: Grossly normal Cath site - without bruit or ecchymosis  Lab Results: Results for orders placed or performed during the hospital encounter of 09/20/14 (from the past 48 hour(s))  CBC     Status: Abnormal   Collection Time: 09/20/14 11:30 AM  Result Value Ref Range   WBC 9.7 4.0 - 10.5 K/uL   RBC 4.82 4.22 - 5.81 MIL/uL   Hemoglobin 14.4 13.0 - 17.0 g/dL   HCT 39.4 39.0 - 52.0 %   MCV 81.7 78.0 - 100.0 fL   MCH 29.9 26.0 - 34.0 pg   MCHC 36.5 (H) 30.0 - 36.0 g/dL   RDW 12.5 11.5 - 15.5 %   Platelets 239 150 - 400 K/uL  CK total and CKMB (cardiac)     Status: Abnormal   Collection Time: 09/20/14 11:30 AM  Result Value Ref Range   Total  CK 83 7 - 232 U/L   CK, MB 5.1 (H) 0.3 - 4.0 ng/mL   Relative Index RELATIVE INDEX IS INVALID 0.0 - 2.5    Comment: WHEN CK < 100 U/L          Comprehensive metabolic panel     Status: Abnormal   Collection Time: 09/20/14 11:30 AM  Result Value Ref Range   Sodium 131 (L) 135 - 145 mmol/L    Comment: Please note change in reference range.   Potassium 3.3 (L) 3.5 - 5.1 mmol/L    Comment: Please note change in reference range.   Chloride 100 96 - 112 mEq/L   CO2 19 19 - 32 mmol/L   Glucose, Bld 258 (H) 70 - 99 mg/dL   BUN 7 6 - 23 mg/dL   Creatinine, Ser 0.85 0.50 - 1.35 mg/dL   Calcium 9.0 8.4 - 10.5 mg/dL   Total Protein 6.7 6.0 - 8.3 g/dL   Albumin 4.1 3.5 - 5.2 g/dL   AST 42 (H) 0 - 37 U/L   ALT 35 0 - 53 U/L   Alkaline Phosphatase 35 (L) 39 - 117 U/L   Total Bilirubin 1.4 (H) 0.3 - 1.2 mg/dL   GFR calc non Af Amer >90 >90 mL/min   GFR calc Af Amer >90 >90 mL/min    Comment: (NOTE) The eGFR has been calculated using the CKD EPI equation. This calculation has not been validated in all clinical situations. eGFR's persistently <90 mL/min signify possible Chronic Kidney Disease.    Anion gap 12 5 - 15  Hemoglobin A1c     Status: Abnormal   Collection Time: 09/20/14 11:30 AM  Result Value Ref Range   Hgb A1c MFr Bld 9.1 (H) <5.7 %    Comment: (NOTE)                                                                       According to the ADA Clinical Practice Recommendations for 2011, when HbA1c is used as a screening test:  >=6.5%   Diagnostic of Diabetes Mellitus           (  if abnormal result is confirmed) 5.7-6.4%   Increased risk of developing Diabetes Mellitus References:Diagnosis and Classification of Diabetes Mellitus,Diabetes QBHA,1937,90(WIOXB 1):S62-S69 and Standards of Medical Care in         Diabetes - 2011,Diabetes DZHG,9924,26 (Suppl 1):S11-S61.    Mean Plasma Glucose 214 (H) <117 mg/dL    Comment: Performed at [facility]  Lipid panel     Status:  Abnormal   Collection Time: 09/20/14 11:30 AM  Result Value Ref Range   Cholesterol 214 (H) 0 - 200 mg/dL   Triglycerides 879 (H) <150 mg/dL   HDL 28 (L) >39 mg/dL   Total CHOL/HDL Ratio 7.6 RATIO   VLDL UNABLE TO CALCULATE IF TRIGLYCERIDE OVER 400 mg/dL 0 - 40 mg/dL   LDL Cholesterol UNABLE TO CALCULATE IF TRIGLYCERIDE OVER 400 mg/dL 0 - 99 mg/dL    Comment:        Total Cholesterol/HDL:CHD Risk Coronary Heart Disease Risk Table                     Men   Women  1/2 Average Risk   3.4   3.3  Average Risk       5.0   4.4  2 X Average Risk   9.6   7.1  3 X Average Risk  23.4   11.0        Use the calculated Patient Ratio above and the CHD Risk Table to determine the patient's CHD Risk.        ATP III CLASSIFICATION (LDL):  <100     mg/dL   Optimal  100-129  mg/dL   Near or Above                    Optimal  130-159  mg/dL   Borderline  160-189  mg/dL   High  >190     mg/dL   Very High   Protime-INR     Status: None   Collection Time: 09/20/14 11:30 AM  Result Value Ref Range   Prothrombin Time 13.9 11.6 - 15.2 seconds   INR 1.06 0.00 - 1.49  APTT     Status: None   Collection Time: 09/20/14 11:30 AM  Result Value Ref Range   aPTT 30 24 - 37 seconds  Troponin I     Status: Abnormal   Collection Time: 09/20/14 11:30 AM  Result Value Ref Range   Troponin I 0.08 (H) <0.031 ng/mL    Comment:        PERSISTENTLY INCREASED TROPONIN VALUES IN THE RANGE OF 0.04-0.49 ng/mL CAN BE SEEN IN:       -UNSTABLE ANGINA       -CONGESTIVE HEART FAILURE       -MYOCARDITIS       -CHEST TRAUMA       -ARRYHTHMIAS       -LATE PRESENTING MYOCARDIAL INFARCTION       -COPD   CLINICAL FOLLOW-UP RECOMMENDED. Please note change in reference range.   I-STAT, chem 8     Status: Abnormal   Collection Time: 09/20/14 11:31 AM  Result Value Ref Range   Sodium 130 (L) 135 - 145 mmol/L   Potassium 3.4 (L) 3.5 - 5.1 mmol/L   Chloride 110 96 - 112 mEq/L   BUN 7 6 - 23 mg/dL   Creatinine, Ser 0.60  0.50 - 1.35 mg/dL   Glucose, Bld 279 (H) 70 - 99 mg/dL   Calcium, Ion 1.13 1.12 -  1.23 mmol/L   TCO2 18 0 - 100 mmol/L   Hemoglobin 13.9 13.0 - 17.0 g/dL   HCT 41.0 39.0 - 52.0 %  POCT Activated clotting time     Status: None   Collection Time: 09/20/14 11:47 AM  Result Value Ref Range   Activated Clotting Time 454 seconds  Glucose, capillary     Status: Abnormal   Collection Time: 09/20/14  1:23 PM  Result Value Ref Range   Glucose-Capillary 199 (H) 70 - 99 mg/dL  MRSA PCR Screening     Status: None   Collection Time: 09/20/14  3:28 PM  Result Value Ref Range   MRSA by PCR NEGATIVE NEGATIVE    Comment:        The GeneXpert MRSA Assay (FDA approved for NASAL specimens only), is one component of a comprehensive MRSA colonization surveillance program. It is not intended to diagnose MRSA infection nor to guide or monitor treatment for MRSA infections.   Glucose, capillary     Status: Abnormal   Collection Time: 09/20/14  3:55 PM  Result Value Ref Range   Glucose-Capillary 176 (H) 70 - 99 mg/dL   Comment 1 Capillary Sample   Troponin I-(serum)     Status: Abnormal   Collection Time: 09/20/14  4:20 PM  Result Value Ref Range   Troponin I 36.92 (HH) <0.031 ng/mL    Comment:        POSSIBLE MYOCARDIAL ISCHEMIA. SERIAL TESTING RECOMMENDED. Please note change in reference range. REPEATED TO VERIFY CRITICAL RESULT CALLED TO, READ BACK BY AND VERIFIED WITH: R GUNDRUM,RN 1734 09/20/14 D BRADLEY   TSH     Status: None   Collection Time: 09/20/14  4:20 PM  Result Value Ref Range   TSH 3.113 0.350 - 4.500 uIU/mL  Glucose, capillary     Status: Abnormal   Collection Time: 09/20/14  9:21 PM  Result Value Ref Range   Glucose-Capillary 225 (H) 70 - 99 mg/dL   Comment 1 Capillary Sample   Troponin I-(serum)     Status: Abnormal   Collection Time: 09/20/14 10:19 PM  Result Value Ref Range   Troponin I 29.62 (HH) <0.031 ng/mL    Comment:        POSSIBLE MYOCARDIAL ISCHEMIA.  SERIAL TESTING RECOMMENDED. Please note change in reference range. REPEATED TO VERIFY CRITICAL VALUE NOTED.  VALUE IS CONSISTENT WITH PREVIOUSLY REPORTED AND CALLED VALUE.   Troponin I-(serum)     Status: Abnormal   Collection Time: 09/21/14  2:47 AM  Result Value Ref Range   Troponin I 20.45 (HH) <0.031 ng/mL    Comment:        POSSIBLE MYOCARDIAL ISCHEMIA. SERIAL TESTING RECOMMENDED. Please note change in reference range. REPEATED TO VERIFY CRITICAL VALUE NOTED.  VALUE IS CONSISTENT WITH PREVIOUSLY REPORTED AND CALLED VALUE.   Basic metabolic panel     Status: Abnormal   Collection Time: 09/21/14  2:47 AM  Result Value Ref Range   Sodium 132 (L) 135 - 145 mmol/L    Comment: Please note change in reference range.   Potassium 3.5 3.5 - 5.1 mmol/L    Comment: Please note change in reference range.   Chloride 100 96 - 112 mEq/L   CO2 20 19 - 32 mmol/L   Glucose, Bld 218 (H) 70 - 99 mg/dL   BUN 8 6 - 23 mg/dL   Creatinine, Ser 0.84 0.50 - 1.35 mg/dL   Calcium 9.2 8.4 - 10.5 mg/dL   GFR calc non  Af Amer >90 >90 mL/min   GFR calc Af Amer >90 >90 mL/min    Comment: (NOTE) The eGFR has been calculated using the CKD EPI equation. This calculation has not been validated in all clinical situations. eGFR's persistently <90 mL/min signify possible Chronic Kidney Disease.    Anion gap 12 5 - 15  Lipid panel     Status: Abnormal   Collection Time: 09/21/14  2:47 AM  Result Value Ref Range   Cholesterol 194 0 - 200 mg/dL   Triglycerides 924 (H) <150 mg/dL   HDL 27 (L) >39 mg/dL   Total CHOL/HDL Ratio 7.2 RATIO   VLDL UNABLE TO CALCULATE IF TRIGLYCERIDE OVER 400 mg/dL 0 - 40 mg/dL   LDL Cholesterol UNABLE TO CALCULATE IF TRIGLYCERIDE OVER 400 mg/dL 0 - 99 mg/dL    Comment:        Total Cholesterol/HDL:CHD Risk Coronary Heart Disease Risk Table                     Men   Women  1/2 Average Risk   3.4   3.3  Average Risk       5.0   4.4  2 X Average Risk   9.6   7.1  3 X Average  Risk  23.4   11.0        Use the calculated Patient Ratio above and the CHD Risk Table to determine the patient's CHD Risk.        ATP III CLASSIFICATION (LDL):  <100     mg/dL   Optimal  100-129  mg/dL   Near or Above                    Optimal  130-159  mg/dL   Borderline  160-189  mg/dL   High  >190     mg/dL   Very High   CBC     Status: None   Collection Time: 09/21/14  2:47 AM  Result Value Ref Range   WBC 9.7 4.0 - 10.5 K/uL   RBC 4.71 4.22 - 5.81 MIL/uL   Hemoglobin 13.8 13.0 - 17.0 g/dL   HCT 39.3 39.0 - 52.0 %   MCV 83.4 78.0 - 100.0 fL   MCH 29.3 26.0 - 34.0 pg   MCHC 35.1 30.0 - 36.0 g/dL   RDW 12.8 11.5 - 15.5 %   Platelets 259 150 - 400 K/uL  Glucose, capillary     Status: Abnormal   Collection Time: 09/21/14  8:40 AM  Result Value Ref Range   Glucose-Capillary 212 (H) 70 - 99 mg/dL    Imaging: No results found.  Assessment:  Principal Problem:   ST elevation myocardial infarction (STEMI) of inferolateral wall Active Problems:   ST elevation MI (STEMI)   Diabetes type 2, uncontrolled   Aortic stenosis due to bicuspid aortic valve   Dyslipidemia, goal LDL below 70   Plan:  1. LCX territory STEMI - successful PCI yesterday. Chest pain has resolved and troponin is trending down. LV function is stable on echo. Continue DAPT. Will add high dose statin. 2. Dyslipidemia - primarily very high triglycerides (at one point >3000). Agree with fenofibrate and crestor 40 mg daily. Check direct LDL today (add-on lab) 3. Aortic stenosis- probably a bicuspid valve - used to be followed at [facility]. Apparently had an echo 5 years ago and told not to worry. This is at least moderate - there is an S2,  however, mean gradient is nearing 40. He will likely need an AVR in the next 6 months, once his co-morbities have improved and he has recovered from his AMI. 4. DM2 - Diagnosis about 1 year ago - A1c was apparently 6.1 about 3 months ago, but is now  9.1. Was on januvia and metformin. Hold metformin until tomorrow due to contrast dye. Restart januvia (substitute tradjenta) and add glipizide today. May need to add basal insulin at some point. Will ask diabetes educator to evaluate today.  Ambulated with cardiac rehab today. Keep in stepdown today, with possible floor transfer tomorrow. Anticipate d/c possibly Monday when better medically optimized.  Time Spent Directly with Patient:  30 minutes  Length of Stay:  LOS: 1 day   Pixie Casino, MD, [facility] Attending Cardiologist [facility]  Erendida Wrenn C 09/21/2014, 10:01 AM

## 2014-09-22 LAB — GLUCOSE, CAPILLARY
Glucose-Capillary: 193 mg/dL — ABNORMAL HIGH (ref 70–99)
Glucose-Capillary: 211 mg/dL — ABNORMAL HIGH (ref 70–99)
Glucose-Capillary: 256 mg/dL — ABNORMAL HIGH (ref 70–99)
Glucose-Capillary: 186 mg/dL — ABNORMAL HIGH (ref 70–99)
Glucose-Capillary: 245 mg/dL — ABNORMAL HIGH (ref 70–99)

## 2014-09-22 MED ORDER — INSULIN GLARGINE 100 UNIT/ML ~~LOC~~ SOLN
15.0000 [IU] | Freq: Every day | SUBCUTANEOUS | Status: DC
  Administered 2014-09-22: 15 [IU] via SUBCUTANEOUS
  Filled 2014-09-22 (×3): qty 0.15

## 2014-09-22 MED ORDER — INSULIN STARTER KIT- SYRINGES (ENGLISH)
1.0000 | Freq: Once | Status: AC
  Administered 2014-09-22: 1
  Filled 2014-09-22: qty 1

## 2014-09-22 MED ORDER — LIVING WELL WITH DIABETES BOOK
Freq: Once | Status: AC
  Administered 2014-09-22: 13:00:00
  Filled 2014-09-22: qty 1

## 2014-09-22 NOTE — Progress Notes (Signed)
Patient Name: Dennis Bates Date of Encounter: 09/22/2014  Principal Problem:   ST elevation myocardial infarction (STEMI) of inferolateral wall Active Problems:   ST elevation MI (STEMI)   Diabetes type 2, uncontrolled   Aortic stenosis due to bicuspid aortic valve   Dyslipidemia, goal LDL below 70   Length of Stay: 2  SUBJECTIVE  No angina, dyspnea or arrhythmia  CURRENT MEDS . aspirin EC  81 mg Oral Daily  . carvedilol  3.125 mg Oral BID WC  . fenofibrate  160 mg Oral Daily  . glipiZIDE  5 mg Oral Q breakfast  . insulin aspart  0-15 Units Subcutaneous TID WC  . insulin starter kit- syringes  1 kit Other Once  . linagliptin  5 mg Oral Daily  . living well with diabetes book   Does not apply Once  . prasugrel  10 mg Oral Daily  . rosuvastatin  40 mg Oral q1800  . sodium chloride  3 mL Intravenous Q12H    OBJECTIVE   Intake/Output Summary (Last 24 hours) at 09/22/14 1303 Last data filed at 09/22/14 0700  Gross per 24 hour  Intake    360 ml  Output      0 ml  Net    360 ml   Filed Weights   09/20/14 1630  Weight: 188 lb 7.9 oz (85.5 kg)    PHYSICAL EXAM Filed Vitals:   09/22/14 0324 09/22/14 0754 09/22/14 1236 09/22/14 1238  BP: 100/76 113/80  100/81  Pulse: 80 82 79   Temp: 97.8 F (36.6 C) 98 F (36.7 C) 97.4 F (36.3 C)   TempSrc: Oral Oral Oral   Resp: $Remo'16 16 17   'enKVf$ Height:      Weight:      SpO2: 96% 98%  98%   General: Alert, oriented x3, no distress Head: no evidence of trauma, PERRL, EOMI, no exophtalmos or lid lag, no myxedema, no xanthelasma; normal ears, nose and oropharynx Neck: normal jugular venous pulsations and no hepatojugular reflux; brisk carotid pulses without delay and no carotid bruits Chest: clear to auscultation, no signs of consolidation by percussion or palpation, normal fremitus, symmetrical and full respiratory excursions Cardiovascular: normal position and quality of the apical impulse, regular rhythm, normal first and second  heart sounds, no rubs or gallops, 3/6 early to mid peaking murmur Abdomen: no tenderness or distention, no masses by palpation, no abnormal pulsatility or arterial bruits, normal bowel sounds, no hepatosplenomegaly Extremities: no clubbing, cyanosis or edema; 2+ radial, ulnar and brachial pulses bilaterally; 2+ right femoral, posterior tibial and dorsalis pedis pulses; 2+ left femoral, posterior tibial and dorsalis pedis pulses; no subclavian or femoral bruits Neurological: grossly nonfocal  LABS  CBC  Recent Labs  09/20/14 1130 09/20/14 1131 09/21/14 0247  WBC 9.7  --  9.7  HGB 14.4 13.9 13.8  HCT 39.4 41.0 39.3  MCV 81.7  --  83.4  PLT 239  --  779   Basic Metabolic Panel  Recent Labs  09/20/14 1130 09/20/14 1131 09/21/14 0247  NA 131* 130* 132*  K 3.3* 3.4* 3.5  CL 100 110 100  CO2 19  --  20  GLUCOSE 258* 279* 218*  BUN $Re'7 7 8  'KfK$ CREATININE 0.85 0.60 0.84  CALCIUM 9.0  --  9.2   Liver Function Tests  Recent Labs  09/20/14 1130  AST 42*  ALT 35  ALKPHOS 35*  BILITOT 1.4*  PROT 6.7  ALBUMIN 4.1   No results for input(s): LIPASE,  AMYLASE in the last 72 hours. Cardiac Enzymes  Recent Labs  09/20/14 1130 09/20/14 1620 09/20/14 2219 09/21/14 0247  CKTOTAL 83  --   --   --   CKMB 5.1*  --   --   --   TROPONINI 0.08* 36.92* 29.62* 20.45*   BNP Invalid input(s): POCBNP D-Dimer No results for input(s): DDIMER in the last 72 hours. Hemoglobin A1C  Recent Labs  09/20/14 1130  HGBA1C 9.1*   Fasting Lipid Panel  Recent Labs  09/21/14 0247 09/21/14 1108  CHOL 194  --   HDL 27*  --   LDLCALC UNABLE TO CALCULATE IF TRIGLYCERIDE OVER 400 mg/dL  --   TRIG 924*  --   CHOLHDL 7.2  --   LDLDIRECT  --  43   Thyroid Function Tests  Recent Labs  09/20/14 1620  TSH 3.113    Radiology Studies Imaging results have been reviewed and No results found.  TELE NSR  ASSESSMENT AND PLAN  Transfer telemetry. Probably DC tomorrow. Will need AVR  probably in next 2-3 years. All non-critical surgery to be deferred for 6-12 months due to DES. Discussed critical importance of dual antiplatelet Rx as well as long term management of DM and other coronary risk factors.   Sanda Klein, MD, [facility] [phone] office [phone] pager 09/22/2014 1:03 PM

## 2014-09-22 NOTE — Progress Notes (Signed)
Spoke to patient by phone.  RN states that he is anxious about whether he needs to take insulin or not.  Briefly discussed his A1C and the effects of uncontrolled diabetes.  He understands that he needs to get his diabetes under better control prior to having cardiac surgery.  Discussed implications of insulin and how Lantus acts as a basal insulin which effects primarily fasting CBG's.  Explained that taking insulin does not mean necessarily that it is forever, however it appears that it would help blood sugar control.  He was diagnosed with diabetes last years and states that he was able to get his A1C down to 6.1%.  However he notes that he was not always taking medications consistently or following guidelines.  [place] Cath[place]  He does have a meter.  He is open to taking basal insulin.  Consider Lantus 15 units daily.  Patient does not currently have insurance.  I can give him a coupon for insulin that will pay for one Rx.  He states that he plans to sign up for insurance at open enrollment.  Will discuss with RN.  Beryl MeagerJenny Doyle Kunath, RN, BC-ADM Inpatient Diabetes Coordinator Pager [phone]

## 2014-09-22 NOTE — Progress Notes (Signed)
Pt tx 2west per MD order, pt VSS, pt tol well, pt and family verbalized understanding of tx, report called to receiving RN all questions answered

## 2014-09-23 ENCOUNTER — Encounter (HOSPITAL_COMMUNITY): Payer: Self-pay | Admitting: *Deleted

## 2014-09-23 ENCOUNTER — Telehealth: Payer: Self-pay | Admitting: Cardiology

## 2014-09-23 DIAGNOSIS — I2510 Atherosclerotic heart disease of native coronary artery without angina pectoris: Secondary | ICD-10-CM | POA: Diagnosis present

## 2014-09-23 DIAGNOSIS — Z9861 Coronary angioplasty status: Secondary | ICD-10-CM

## 2014-09-23 DIAGNOSIS — E781 Pure hyperglyceridemia: Secondary | ICD-10-CM | POA: Diagnosis present

## 2014-09-23 LAB — GLUCOSE, CAPILLARY
Glucose-Capillary: 166 mg/dL — ABNORMAL HIGH (ref 70–99)
Glucose-Capillary: 223 mg/dL — ABNORMAL HIGH (ref 70–99)

## 2014-09-23 LAB — BASIC METABOLIC PANEL
Anion gap: 7 (ref 5–15)
BUN: 11 mg/dL (ref 6–23)
CO2: 24 mmol/L (ref 19–32)
Calcium: 8.7 mg/dL (ref 8.4–10.5)
Chloride: 106 mEq/L (ref 96–112)
Creatinine, Ser: 0.82 mg/dL (ref 0.50–1.35)
GFR calc Af Amer: >90 mL/min (ref >90)
GFR calc non Af Amer: >90 mL/min (ref >90)
Glucose, Bld: 161 mg/dL — ABNORMAL HIGH (ref 70–99)
Potassium: 3.8 mmol/L (ref 3.5–5.1)
Sodium: 137 mmol/L (ref 135–145)

## 2014-09-23 MED ORDER — ASPIRIN 81 MG PO TBEC: 81.0000 mg | DELAYED_RELEASE_TABLET | Freq: Every day | ORAL | Status: DC

## 2014-09-23 MED ORDER — PRASUGREL HCL 10 MG PO TABS: 10.0000 mg | ORAL_TABLET | Freq: Every day | ORAL | Status: DC

## 2014-09-23 MED ORDER — CARVEDILOL 3.125 MG PO TABS: 3.1250 mg | ORAL_TABLET | Freq: Two times a day (BID) | ORAL | Status: DC

## 2014-09-23 MED ORDER — FENOFIBRATE 160 MG PO TABS: 160.0000 mg | ORAL_TABLET | Freq: Every day | ORAL | Status: DC

## 2014-09-23 MED ORDER — NITROGLYCERIN 0.4 MG SL SUBL: 0.4000 mg | SUBLINGUAL_TABLET | SUBLINGUAL | Status: DC | PRN

## 2014-09-23 MED ORDER — ATORVASTATIN CALCIUM 80 MG PO TABS: 80.0000 mg | ORAL_TABLET | Freq: Every day | ORAL | Status: DC

## 2014-09-23 MED ORDER — ROSUVASTATIN CALCIUM 40 MG PO TABS: 40.0000 mg | ORAL_TABLET | Freq: Every day | ORAL | Status: DC

## 2014-09-23 MED ORDER — GLIPIZIDE ER 5 MG PO TB24: 5.0000 mg | ORAL_TABLET | Freq: Every day | ORAL | Status: DC

## 2014-09-23 MED FILL — Sodium Chloride IV Soln 0.9%: INTRAVENOUS | Qty: 50 | Status: AC

## 2014-09-23 NOTE — Progress Notes (Signed)
CARDIAC REHAB PHASE I   PRE:  Rate/Rhythm: 84 SR  BP:  Supine:   Sitting: 102/70  Standing:    SaO2: 97 RA  MODE:  Ambulation: 890 ft   POST:  Rate/Rhythm: 90  BP:  Supine:   Sitting: 100/70  Standing:    SaO2: 98 RA 0925-1055 Pt tolerated ambulation well without c/o of cp or SOB. VS stable Pt back to recliner after walk with call light in reach. Completed MI and stent education with pt and wife. They voice understanding. Pt agrees to Outpt. [facility] in [place], will send referral. He has no insurance coverage. I gave him financial form to apply for assistance. He is concerned about being able to afford his medications. I will discuss with case manager. Pt is feeling overwhelmed and he is trying to "absorb" information.  Melina CopaLisa Tambi Thole RN 09/23/2014 10:54 AM

## 2014-09-23 NOTE — Telephone Encounter (Signed)
New message    tcm appt on  1/18 @ 8:00 . Per Carlean JewsKatie Thompson.

## 2014-09-23 NOTE — Progress Notes (Signed)
Inpatient Diabetes Program Recommendations  AACE/ADA: New Consensus Statement on Inpatient Glycemic Control (2013)  Target Ranges:  Prepandial:   less than 140 mg/dL      Peak postprandial:   less than 180 mg/dL (1-2 hours)      Critically ill patients:  140 - 180 mg/dL   This coordinator met with patient and wife to discuss DM management at home.  Pt reports not taking his DM meds as prescribed and not monitoring CBGs consistently.  Pt is motivated to make lifestyle changes by eating healthy and adding exercise.  Pt also states he will be diligent with monitoring his glucose and taking his meds as prescribed.  Discussed basic carb counting, S/S of hypoglycemia and how to treat.  No questions/concerns at the end of our visit. Thank you  Raoul Pitch BSN, RN,CDE Inpatient Diabetes Coordinator [phone] (team pager)

## 2014-09-23 NOTE — Discharge Instructions (Signed)

## 2014-09-23 NOTE — Discharge Summary (Signed)
Discharge Summary   Patient ID: Dennis Bates MRN: [id number], DOB/AGE: [birth year] 54 y.o. Admit date: 09/20/2014 D/C date:     09/23/2014  Primary Cardiologist: Dr. [name] ( new )  Principal Problem:   ST elevation myocardial infarction (STEMI) of inferolateral wall Active Problems:   Diabetes type 2, uncontrolled   Aortic stenosis due to bicuspid aortic valve   Dyslipidemia, goal LDL below 70   Hyperlipidemia   Hypertriglyceridemia   CAD (coronary artery disease)   Admission Dates: 09/20/14-09/23/14 Discharge Diagnosis: inferolateral STEMI s/p DES to mLCx-OM-2  HPI: Dennis Bates is a 54 y.o. male with a history of hyperlipidemia, hypertriglyceridemia, bells palsy and DM-2 who presented to [facility] on 09/20/14 with chest pain.  He never smoked and per family, he has not had any illicit drug use history. He had chest pain on the morning on 09/20/14 prompting the patient to seek medical attention at his PCP's office. EKG obtained in his PCP's office was concerning for ST elevation in inferolateral leads. EMS was called and he was transferred to [facility] for emergent cardiac catheterization.    Hospital Course  STEMI of inferolateral wall- s/p emergent LHC on 09/20/14 which revealed severe single-vessel disease with mid Circumflex-OM 2 with 95% subtotal thrombotic occlusion successfully treated with a single posterior DES stent. -- Troponin >20 -- 2D ECHO with LVEF 55-60%, moderate LVH, no regional wall motion abnormalities, diastolic dysfunction, indeterminate LV filling pressure -- Continue ASA/BB/Effient/high dose statin. -- Plans to enroll in cardiac rehab  Bicuspid AV with moderate to severe AS -Will need AVR probably in next 2-3 years. All non-critical surgery to be deferred for 6-12 months due to DES. -- 2D ECHO with heavily calcified aortic valve which may be functionally bicuspid or bicuspid, with moderate to severe aortic stenosis - AVA around 0.9 cm2.  Type II DM - per PCP - needs  aggressive treatment - f/u with PCP for elevated HbA1C (9.1) -- Safe to resume Metformin >48 hours post contrast dye exposure on 09/20/14. -- Continue home regimen. Glipizide added while inpatient. I spoke with patient about making an appointment with his PCP as soon as possible to help him get on a better regimen for his DM.   Dyslipidemia - TG >900. LDL unable to be calculated.   -- Cont high dose statin and fenofibrate. Crestor was switched to Lipitor as he does not have insurance.  -- Will need FLP and ALT in 6 weeks  The patient has had an uncomplicated hospital course and is recovering well. The radial catheter site is stable. He has been seen by Dr. Mayford Knife today and deemed ready for discharge home. All follow-up appointments have been scheduled.  Discharge medications are listed below. He has no insurance and care management worked with to get assistance on his medicines. I have filled out financial assistance forms for his effient. This may eventually have to be switched to plavix due to prohibitive cost.   Discharge Vitals: Blood pressure 99/77, pulse 80, temperature 98 F (36.7 C), temperature source Oral, resp. rate 16, height  (1.702 m), weight 188 lb 7.9 oz (85.5 kg), SpO2 97 %.  Labs: Lab Results  Component Value Date   WBC 9.7 09/21/2014   HGB 13.8 09/21/2014   HCT 39.3 09/21/2014   MCV 83.4 09/21/2014   PLT 259 09/21/2014    Recent Labs Lab 09/20/14 1130  09/23/14 0337  NA 131*  < > 137  K 3.3*  < > 3.8  CL 100  < >  106  CO2 19  < > 24  BUN 7  < > 11  CREATININE 0.85  < > 0.82  CALCIUM 9.0  < > 8.7  PROT 6.7  --   --   BILITOT 1.4*  --   --   ALKPHOS 35*  --   --   ALT 35  --   --   AST 42*  --   --   GLUCOSE 258*  < > 161*  < > = values in this interval not displayed.  Recent Labs  09/20/14 1620 09/20/14 2219 09/21/14 0247  TROPONINI 36.92* 29.62* 20.45*   Lab Results  Component Value Date   CHOL 194 09/21/2014   HDL 27* 09/21/2014   LDLCALC  UNABLE TO CALCULATE IF TRIGLYCERIDE OVER 400 mg/dL 16/06/9603   TRIG 540* 09/21/2014     Diagnostic Studies/Procedures   2D ECHO Study Date: 09/20/2014 LV EF: 55% -   60% Study Conclusions - Left ventricle: The cavity size was normal. Wall thickness was   increased in a pattern of moderate LVH. Systolic function was   normal. The estimated ejection fraction was in the range of 55%   to 60%. Wall motion was normal; there were no regional wall   motion abnormalities. Doppler parameters are consistent with   abnormal left ventricular relaxation (grade 1 diastolic   dysfunction). The E/e&' ratio is between 8-15, suggesting   indeterminate LV filling pressure. - Aortic valve: Heavily calcified and thickened aortic valve. May   be bicuspid or functionally bicuspid. Moderate to severe aortic   stenosis. Mean gradient (S): 34 mm Hg. Peak gradient (S): 59 mm   Hg. Valve area (VTI): 0.92 cm^2. Valve area (Vmean): 0.74 cm^2. - Mitral valve: Mildly thickened leaflets . There was mild   regurgitation. - Left atrium: The atrium was normal in size. - Tricuspid valve: There was mild regurgitation. - Pulmonary arteries: PA peak pressure: 32 mm Hg (S). Impressions: - LVEF 55-60%, moderate LVH, no regional wall motion abnormalities,   diastolic dysfunction, indeterminate LV filling pressure, heavily   calcified aortic valve which may be functionally bicuspid or   bicuspid, with moderate to severe aortic stenosis - AVA around   0.9 cm2.    CARDIAC CATHETERIZATION AND PERCUTANEOUS CORONARY INTERVENTION REPORT Procedure Date: 09/20/2014  FINDINGS:  Hemodynamics:   Central Aortic Pressure: 139/90 mmHg Left Ventriculography: not assessed Coronary Anatomy:  Dominance: right  Left Main: large caliber vessel it bifurcates into the LAD and circumflex. Angiographically normal. LAD: moderate caliber vessel with proximal/ostial 20% stenosis. Gives rise to a very proximal septal perforator. The  vessel itself was relatively free of disease as it coursed around the apex.  D1: small caliber vessel with a mid LAD. Angiographically normal Left Circumflex: our chart, nondominant vessel it gives rise to a proximal OM1 that is moderate in caliber. Vessel terminates as a large lateral OM2 that has a focal 99% subtotal thrombotic occlusion by a hazy filling defects just distal to this lesion suggestive of thrombus. There was TIMI 1 flow distally beyond the thrombus.  RCA: normal caliber, dominant vessel with mild irregularities.It Bifurcates Distally into the Right Posterior Descending Artery (RPDA) and the Right Posterior AV Groove Branch (RPAV).  RPDA: moderate caliber vessel that reaches the apex. Mild luminal irregularities.  RPL Sysytem:The RPAV because of moderate caliber vessel that terminates as one major posterolateral branch and several smaller branches occluded AV nodal artery. Angiographically normal. After reviewing the initial angiography, the culprit lesion was thought  to be subtotal occlusion of the Mid Circumflex-OM 2. Preparation were made to proceed with PCI on this lesion. Due to the distal thrombus burden, I decided to initially perform thrombectomy as the initial invasive procedure. POST-OPERATIVE DIAGNOSIS:   Severe single-vessel disease with Mid Circumflex-OM 2 with 95% subtotal thrombotic occlusion successfully treated with a single posterior DES stent.  Presumptive diagnosis of aortic valve disease based on the patient reporting history of murmur involving aortic valve. Unsure of the true etiology.  Patient also reports having hypertriglyceridemia with very high levels of triglycerides.   Discharge Medications     Medication List    STOP taking these medications        TRADJENTA 5 MG Tabs tablet  Generic drug:  linagliptin      TAKE these medications        aspirin 81 MG EC tablet  Take 1 tablet (81 mg total) by mouth daily.     atorvastatin 80 MG  tablet  Commonly known as:  LIPITOR  Take 1 tablet (80 mg total) by mouth daily.     carvedilol 3.125 MG tablet  Commonly known as:  COREG  Take 1 tablet (3.125 mg total) by mouth 2 (two) times daily with a meal.     fenofibrate 160 MG tablet  Take 1 tablet (160 mg total) by mouth daily.     glipiZIDE 5 MG 24 hr tablet  Commonly known as:  GLUCOTROL XL  Take 1 tablet (5 mg total) by mouth daily with breakfast.     metFORMIN 1000 MG tablet  Commonly known as:  GLUCOPHAGE  Take 500 mg by mouth 2 (two) times daily with a meal.     nitroGLYCERIN 0.4 MG SL tablet  Commonly known as:  NITROSTAT  Place 1 tablet (0.4 mg total) under the tongue every 5 (five) minutes x 3 doses as needed for chest pain.     prasugrel 10 MG Tabs tablet  Commonly known as:  EFFIENT  Take 1 tablet (10 mg total) by mouth daily.     sitaGLIPtin 100 MG tablet  Commonly known as:  JANUVIA  Take 100 mg by mouth daily.        Disposition   The patient will be discharged in stable condition to home. Discharge Instructions    Amb Referral to Cardiac Rehabilitation    Complete by:  As directed           Follow-up Information    Follow up with Tereso NewcomerScott Weaver, PA-C On 10/01/2014.   Specialty:  Physician Assistant   Why:  @ 8am   Contact information:   [address] [place] [address] [phone]       Follow up with Bufford SpikesMaynard, Jennifer S, NP.   Why:  Please call to make an appointment to work on your diabtetes managment   Contact information:   [address] [place] [address] [phone]         Duration of Discharge Encounter: Greater than 30 minutes including physician and PA time.  Byrd HesselbachSigned, THOMPSON, KATHRYN R PA-C 09/23/2014, 11:54 AM

## 2014-09-23 NOTE — Progress Notes (Addendum)
SUBJECTIVE:  No complaints  OBJECTIVE:   Vitals:   Filed Vitals:   09/22/14 1716 09/22/14 1854 09/22/14 1929 09/23/14 0537  BP: 110/76 116/75 129/83 99/77  Pulse: 83 86 82 80  Temp: 97.8 F (36.6 C) 98.1 F (36.7 C) 98.6 F (37 C) 98 F (36.7 C)  TempSrc: Oral Oral Oral Oral  Resp:   18 16  Height:      Weight:      SpO2: 97% 97% 99% 97%   I&O's:   Intake/Output Summary (Last 24 hours) at 09/23/14 1053 Last data filed at 09/23/14 0800  Gross per 24 hour  Intake    723 ml  Output      0 ml  Net    723 ml   TELEMETRY: Reviewed telemetry pt in NSR:     PHYSICAL EXAM General: Well developed, well nourished, in no acute distress Head: Eyes PERRLA, No xanthomas.   Normal cephalic and atramatic  Lungs:   Clear bilaterally to auscultation and percussion. Heart:   HRRR S1 S2 Pulses are 2+ & equal. Abdomen: Bowel sounds are positive, abdomen soft and non-tender without masses  Extremities:   No clubbing, cyanosis or edema.  DP +1 Neuro: Alert and oriented X 3. Psych:  Good affect, responds appropriately   LABS: Basic Metabolic Panel:  Recent Labs  40/98/11 0247 09/23/14 0337  NA 132* 137  K 3.5 3.8  CL 100 106  CO2 20 24  GLUCOSE 218* 161*  BUN 8 11  CREATININE 0.84 0.82  CALCIUM 9.2 8.7   Liver Function Tests:  Recent Labs  09/20/14 1130  AST 42*  ALT 35  ALKPHOS 35*  BILITOT 1.4*  PROT 6.7  ALBUMIN 4.1   No results for input(s): LIPASE, AMYLASE in the last 72 hours. CBC:  Recent Labs  09/20/14 1130 09/20/14 1131 09/21/14 0247  WBC 9.7  --  9.7  HGB 14.4 13.9 13.8  HCT 39.4 41.0 39.3  MCV 81.7  --  83.4  PLT 239  --  259   Cardiac Enzymes:  Recent Labs  09/20/14 1130 09/20/14 1620 09/20/14 2219 09/21/14 0247  CKTOTAL 83  --   --   --   CKMB 5.1*  --   --   --   TROPONINI 0.08* 36.92* 29.62* 20.45*   BNP: Invalid input(s): POCBNP D-Dimer: No results for input(s): DDIMER in the last 72 hours. Hemoglobin A1C:  Recent  Labs  09/20/14 1130  HGBA1C 9.1*   Fasting Lipid Panel:  Recent Labs  09/21/14 0247 09/21/14 1108  CHOL 194  --   HDL 27*  --   LDLCALC UNABLE TO CALCULATE IF TRIGLYCERIDE OVER 400 mg/dL  --   TRIG 914*  --   CHOLHDL 7.2  --   LDLDIRECT  --  43   Thyroid Function Tests:  Recent Labs  09/20/14 1620  TSH 3.113   Anemia Panel: No results for input(s): VITAMINB12, FOLATE, FERRITIN, TIBC, IRON, RETICCTPCT in the last 72 hours. Coag Panel:   Lab Results  Component Value Date   INR 1.06 09/20/2014    RADIOLOGY: No results found.   Principal Problem:  ST elevation myocardial infarction (STEMI) of inferolateral wall Active Problems:  ST elevation MI (STEMI)  Diabetes type 2, uncontrolled  Aortic stenosis due to bicuspid aortic valve  Dyslipidemia, goal LDL below 70  ASSESSMENT AND PLAN 1.  STEMI of inferolateral wall.  Hemodynamic and rhythm stable. Continue ASA/BB/Effient/high dose statin. 2.  Bicuspid AV with  moderate to severe AS -Will need AVR probably in next 2-3 years. All non-critical surgery to be deferred for 6-12 months due to DES. 3.  Type II DM - per PCP - needs aggressive treatment - f/u with PCP for elevated HbA1C.  Restart Metformin and continue Trajenta.   4.  Dyslipidemia - on high dose statin.  Will need FLP and ALT in 6 weeks  Stable for discharge home today  Quintella ReichertURNER,TRACI R, MD  09/23/2014  10:53 AM

## 2014-09-24 NOTE — Telephone Encounter (Signed)
lmom 

## 2014-09-25 NOTE — Telephone Encounter (Signed)
lmom 

## 2014-09-26 NOTE — Telephone Encounter (Signed)
lmom 

## 2014-09-30 NOTE — Progress Notes (Signed)
Cardiology Office Note   Date:  10/01/2014   ID:  Dennis Bates, DOB 09/11/1961, MRN [id number]  PCP:  Bufford SpikesMaynard, Jennifer S, NP  Cardiologist:  Dr. Bryan Lemmaavid Harding    Chief Complaint  Patient presents with  . Hospitalization Follow-up    Inferolateral STEMI treated with DES to the OM2      History of Present Illness: Dennis Bates is a 54 y.o. male who presents for post hospital FU.    He has a hx of hyperlipidemia, hypertriglyceridemia, bells palsy and DM-2.  He was admitted 1/8-1/11 with an inferolateral STEMI.  LHC demonstrated critical stenosis in the OM2 that was treated with a DES.  Patient was noted to have significant hypertriglyceridemia. Fenofibrate was added and then to high-dose statin. Echocardiogram demonstrated moderate to severe aortic stenosis with probable bicuspid aortic valve. It was felt that he would likely need AVR in the next 2-3 years. Since discharge, he is doing well. He denies recurrent chest discomfort. He has been increasing his walking. He recently did note some dyspnea after finishing a walk. Otherwise, he is NYHA 2. He denies orthopnea, PND or edema. He denies syncope.  Studies Reviewed: LHC 09/20/14: Proximal/ostial LAD 20%, OM2 99% >> PCI: 3 x 16 mm Promus Premier DES to the OM2. Echo 09/20/14: Moderate LVH, EF 55-60%, normal wall motion, grade 1 diastolic dysfunction, probable bicuspid aortic valve with moderate to severe aortic stenosis (mean 34 mmHg), mild MR, mild TR, PASP 32 mmHg   Past Medical History  Diagnosis Date  . Hyperlipidemia   . Diabetes   . Aortic stenosis     Probable bicuspid aortic valve >> Echo 09/20/14: Moderate LVH, EF 55-60%, normal wall motion, grade 1 diastolic dysfunction, probable bicuspid aortic valve with moderate to severe aortic stenosis (mean 34 mmHg), mild MR, mild TR, PASP 32 mmHg  . Bell's palsy   . Aortic stenosis due to bicuspid aortic valve     a. mod-sev by 2D ECHO 09/2014. AVA 0.9cm^2  . Hypertriglyceridemia     . CAD (coronary artery disease)     a. inferolat STEMI 09/20/14 >> LHC 09/20/14: Proximal/ostial LAD 20%, OM2 99% >> PCI: 3 x 16 mm Promus Premier DES to the OM2.    Past Surgical History  Procedure Laterality Date  . Cardiac catheterization      remote h/o cath >15 yrs ago, unkown  . Doppler echocardiography    . Left heart catheterization with coronary angiogram N/A 09/20/2014    Procedure: LEFT HEART CATHETERIZATION WITH CORONARY ANGIOGRAM;  Surgeon: Marykay Lexavid W Harding, MD;  [facility];  Service: Cardiovascular;  Laterality: N/A;     Current Outpatient Prescriptions  Medication Sig Dispense Refill  . aspirin EC 81 MG EC tablet Take 1 tablet (81 mg total) by mouth daily.    Marland Kitchen. atorvastatin (LIPITOR) 80 MG tablet Take 1 tablet (80 mg total) by mouth daily. 30 tablet 11  . carvedilol (COREG) 3.125 MG tablet Take 1 tablet (3.125 mg total) by mouth 2 (two) times daily with a meal. 60 tablet 11  . fenofibrate 160 MG tablet Take 1 tablet (160 mg total) by mouth daily. 30 tablet 11  . glipiZIDE (GLUCOTROL XL) 5 MG 24 hr tablet Take 1 tablet (5 mg total) by mouth daily with breakfast. 30 tablet 11  . metFORMIN (GLUCOPHAGE) 1000 MG tablet Take 500 mg by mouth 2 (two) times daily with a meal.     . nitroGLYCERIN (NITROSTAT) 0.4 MG SL tablet Place 1 tablet (  0.4 mg total) under the tongue every 5 (five) minutes x 3 doses as needed for chest pain. 25 tablet 12  . prasugrel (EFFIENT) 10 MG TABS tablet Take 1 tablet (10 mg total) by mouth daily. 30 tablet 11  . sitaGLIPtin (JANUVIA) 100 MG tablet Take 100 mg by mouth daily.     No current facility-administered medications for this visit.    Allergies:   Review of patient's allergies indicates no known allergies.    Social History:  The patient  reports that he has never smoked. He does not have any smokeless tobacco history on file. He reports that he does not drink alcohol or use illicit drugs.   Family History:  The patient's family history  is negative for CAD.    ROS:  Please see the history of present illness.   Otherwise, review of systems are positive for none.   All other systems are reviewed and negative.    PHYSICAL EXAM: VS:  BP 110/82 mmHg  Pulse 72  Ht  (1.702 m)  Wt 186 lb 1.9 oz (84.423 kg)  BMI 29.14 kg/m2 , BMI Body mass index is 29.14 kg/(m^2). GEN: Well nourished, well developed, in no acute distress HEENT: normal Neck: no JVD  Cardiac:  RRR; 2/6 systolic ejection murmur heard best at the right upper sternal border, no edema; right wrist without hematoma or mass  Respiratory:  clear to auscultation bilaterally, normal work of breathing GI: soft, nontender, nondistended, + BS MS: no deformity or atrophy Skin: warm and dry  Neuro:  Strength and sensation are intact Psych: Normal affect   EKG:  EKG is ordered today. The ekg ordered today demonstrates NSR, HR 74, inferior and lateral Q waves, inferolateral T-wave inversions, no significant changes prior tracing 09/21/14   Recent Labs: 09/20/2014: ALT 35; TSH 3.113 09/21/2014: Hemoglobin 13.8; Platelets 259 09/23/2014: BUN 11; Creatinine 0.82; Potassium 3.8; Sodium 137    Lipid Panel    Component Value Date/Time   CHOL 194 09/21/2014 0247   TRIG 924* 09/21/2014 0247   HDL 27* 09/21/2014 0247   CHOLHDL 7.2 09/21/2014 0247   VLDL UNABLE TO CALCULATE IF TRIGLYCERIDE OVER 400 mg/dL 16/06/9603 5409   LDLCALC UNABLE TO CALCULATE IF TRIGLYCERIDE OVER 400 mg/dL 81/19/1478 2956   LDLDIRECT 43 09/21/2014 1108      Wt Readings from Last 3 Encounters:  10/01/14 186 lb 1.9 oz (84.423 kg)  09/20/14 188 lb 7.9 oz (85.5 kg)      ASSESSMENT AND PLAN:  1. Coronary Artery Disease: He is doing well after recent inferolateral STEMI treated with a drug-eluting stent to the OM2.  He understands the importance of dual antiplatelet therapy.     -  Continue aspirin, Effient, statin, beta blocker.      -  Referred to cardiac rehabilitation. 2. Aortic Stenosis:  Reviewed on saw records indicates that the patient will likely need aortic valve replacement at some point in the near future. Hopefully this will be after he has completed 1 year of dual antiplatelet therapy after his recent ST elevation myocardial infarction. He is currently not having any symptoms to suggest critical aortic stenosis. He will likely need a follow-up echocardiogram in the next 6 months. 3. Hyperlipidemia: Continue fenofibrate and Lipitor.    -  Arrange lipids and LFTs in 4 weeks. 4. Diabetes Mellitus:  Blood sugars at home have been 130s to 140s. Follow-up with primary care.    Current medicines are reviewed at length with the patient  today.  The patient does not have concerns regarding medicines.  The following changes have been made:  no change  Labs/ tests ordered today include:   Orders Placed This Encounter  Procedures  . Hepatic function panel  . Lipid Profile  . EKG 12-Lead    Disposition:   FU with Dr. Bryan Lemmaavid Harding  in 6 weeks   Signed, Brynda RimScott Carless Slatten, PA-C, MHS 10/01/2014 8:13 AM    [facility] [address], [place]  [address] Phone: [phone]; Fax: [phone]

## 2014-10-01 ENCOUNTER — Ambulatory Visit (INDEPENDENT_AMBULATORY_CARE_PROVIDER_SITE_OTHER): Payer: Self-pay | Admitting: Physician Assistant

## 2014-10-01 ENCOUNTER — Encounter: Payer: Self-pay | Admitting: Physician Assistant

## 2014-10-01 VITALS — BP 110/82 | HR 72 | Ht 67.0 in | Wt 186.1 lb

## 2014-10-01 DIAGNOSIS — E119 Type 2 diabetes mellitus without complications: Secondary | ICD-10-CM

## 2014-10-01 DIAGNOSIS — E785 Hyperlipidemia, unspecified: Secondary | ICD-10-CM

## 2014-10-01 DIAGNOSIS — I350 Nonrheumatic aortic (valve) stenosis: Secondary | ICD-10-CM

## 2014-10-01 DIAGNOSIS — I2510 Atherosclerotic heart disease of native coronary artery without angina pectoris: Secondary | ICD-10-CM

## 2014-10-01 NOTE — Patient Instructions (Signed)
Your physician recommends that you schedule a follow-up appointment in: 6 WEEKS WITH DR. HARDING.  Your physician recommends that you return for lab work in: 4 WEEKS FOR FASTING LIVER LIPID PANEL.   You have been referred to CARDIAC REHAB AT [place].  Your physician recommends that you continue on your current medications as directed. Please refer to the Current Medication list given to you today.

## 2014-10-01 NOTE — Progress Notes (Addendum)
Cardiology Office Note   Date:  10/01/2014   ID:  Dennis Bates, DOB 09/11/1961, MRN [id number]  PCP:  Bufford SpikesMaynard, Jennifer S, NP  Cardiologist:  Dr. Bryan Lemmaavid Harding    Chief Complaint  Patient presents with  . Hospitalization Follow-up    Inferolateral STEMI treated with DES to the OM2      History of Present Illness: Dennis Bates is a 54 y.o. male who presents for post hospital FU.    He has a hx of hyperlipidemia, hypertriglyceridemia, bells palsy and DM-2.  He was admitted 1/8-1/11 with an inferolateral STEMI.  LHC demonstrated critical stenosis in the OM2 that was treated with a DES.  Patient was noted to have significant hypertriglyceridemia. Fenofibrate was added and then to high-dose statin. Echocardiogram demonstrated moderate to severe aortic stenosis with probable bicuspid aortic valve. It was felt that he would likely need AVR in the next 2-3 years. Since discharge, he is doing well. He denies recurrent chest discomfort. He has been increasing his walking. He recently did note some dyspnea after finishing a walk. Otherwise, he is NYHA 2. He denies orthopnea, PND or edema. He denies syncope.  Studies Reviewed: LHC 09/20/14: Proximal/ostial LAD 20%, OM2 99% >> PCI: 3 x 16 mm Promus Premier DES to the OM2. Echo 09/20/14: Moderate LVH, EF 55-60%, normal wall motion, grade 1 diastolic dysfunction, probable bicuspid aortic valve with moderate to severe aortic stenosis (mean 34 mmHg), mild MR, mild TR, PASP 32 mmHg   Past Medical History  Diagnosis Date  . Hyperlipidemia   . Diabetes   . Aortic stenosis     Probable bicuspid aortic valve >> Echo 09/20/14: Moderate LVH, EF 55-60%, normal wall motion, grade 1 diastolic dysfunction, probable bicuspid aortic valve with moderate to severe aortic stenosis (mean 34 mmHg), mild MR, mild TR, PASP 32 mmHg  . Bell's palsy   . Aortic stenosis due to bicuspid aortic valve     a. mod-sev by 2D ECHO 09/2014. AVA 0.9cm^2  . Hypertriglyceridemia     . CAD (coronary artery disease)     a. inferolat STEMI 09/20/14 >> LHC 09/20/14: Proximal/ostial LAD 20%, OM2 99% >> PCI: 3 x 16 mm Promus Premier DES to the OM2.    Past Surgical History  Procedure Laterality Date  . Cardiac catheterization      remote h/o cath >15 yrs ago, unkown  . Doppler echocardiography    . Left heart catheterization with coronary angiogram N/A 09/20/2014    Procedure: LEFT HEART CATHETERIZATION WITH CORONARY ANGIOGRAM;  Surgeon: Marykay Lexavid W Harding, MD;  [facility];  Service: Cardiovascular;  Laterality: N/A;     Current Outpatient Prescriptions  Medication Sig Dispense Refill  . aspirin EC 81 MG EC tablet Take 1 tablet (81 mg total) by mouth daily.    Marland Kitchen. atorvastatin (LIPITOR) 80 MG tablet Take 1 tablet (80 mg total) by mouth daily. 30 tablet 11  . carvedilol (COREG) 3.125 MG tablet Take 1 tablet (3.125 mg total) by mouth 2 (two) times daily with a meal. 60 tablet 11  . fenofibrate 160 MG tablet Take 1 tablet (160 mg total) by mouth daily. 30 tablet 11  . glipiZIDE (GLUCOTROL XL) 5 MG 24 hr tablet Take 1 tablet (5 mg total) by mouth daily with breakfast. 30 tablet 11  . metFORMIN (GLUCOPHAGE) 1000 MG tablet Take 500 mg by mouth 2 (two) times daily with a meal.     . nitroGLYCERIN (NITROSTAT) 0.4 MG SL tablet Place 1 tablet (  0.4 mg total) under the tongue every 5 (five) minutes x 3 doses as needed for chest pain. 25 tablet 12  . prasugrel (EFFIENT) 10 MG TABS tablet Take 1 tablet (10 mg total) by mouth daily. 30 tablet 11  . sitaGLIPtin (JANUVIA) 100 MG tablet Take 100 mg by mouth daily.     No current facility-administered medications for this visit.    Allergies:   Review of patient's allergies indicates no known allergies.    Social History:  The patient  reports that he has never smoked. He does not have any smokeless tobacco history on file. He reports that he does not drink alcohol or use illicit drugs.   Family History:  The patient's family history  is negative for CAD.    ROS:  Please see the history of present illness.   Otherwise, review of systems are positive for none.   All other systems are reviewed and negative.    PHYSICAL EXAM: VS:  BP 110/82 mmHg  Pulse 72  Ht  (1.702 m)  Wt 186 lb 1.9 oz (84.423 kg)  BMI 29.14 kg/m2 , BMI Body mass index is 29.14 kg/(m^2). GEN: Well nourished, well developed, in no acute distress HEENT: normal Neck: no JVD  Cardiac:  RRR; 2/6 systolic ejection murmur heard best at the right upper sternal border, no edema; right wrist without hematoma or mass  Respiratory:  clear to auscultation bilaterally, normal work of breathing GI: soft, nontender, nondistended, + BS MS: no deformity or atrophy Skin: warm and dry  Neuro:  Strength and sensation are intact Psych: Normal affect   EKG:  EKG is ordered today. The ekg ordered today demonstrates NSR, HR 74, inferior and lateral Q waves, inferolateral T-wave inversions, no significant changes prior tracing 09/21/14   Recent Labs: 09/20/2014: ALT 35; TSH 3.113 09/21/2014: Hemoglobin 13.8; Platelets 259 09/23/2014: BUN 11; Creatinine 0.82; Potassium 3.8; Sodium 137    Lipid Panel    Component Value Date/Time   CHOL 194 09/21/2014 0247   TRIG 924* 09/21/2014 0247   HDL 27* 09/21/2014 0247   CHOLHDL 7.2 09/21/2014 0247   VLDL UNABLE TO CALCULATE IF TRIGLYCERIDE OVER 400 mg/dL 16/06/9603 5409   LDLCALC UNABLE TO CALCULATE IF TRIGLYCERIDE OVER 400 mg/dL 81/19/1478 2956   LDLDIRECT 43 09/21/2014 1108      Wt Readings from Last 3 Encounters:  10/01/14 186 lb 1.9 oz (84.423 kg)  09/20/14 188 lb 7.9 oz (85.5 kg)      ASSESSMENT AND PLAN:  1. Coronary Artery Disease: He is doing well after recent inferolateral STEMI treated with a drug-eluting stent to the OM2.  He understands the importance of dual antiplatelet therapy.     -  Continue aspirin, Effient, statin, beta blocker.      -  Refer to cardiac rehabilitation. 2. Aortic Stenosis:  Reviewed of records indicates that the patient will likely need aortic valve replacement at some point in the near future. Hopefully this will be after he has completed 1 year of dual antiplatelet therapy after his recent ST elevation myocardial infarction. He is currently not having any symptoms to suggest critical aortic stenosis. He will likely need a follow-up echocardiogram in the next 6 months. 3. Hyperlipidemia: Continue fenofibrate and Lipitor.    -  Arrange lipids and LFTs in 4 weeks. 4. Diabetes Mellitus:  Blood sugars at home have been 130s to 140s. Follow-up with primary care.    Current medicines are reviewed at length with the patient today.  The patient does not have concerns regarding medicines.  The following changes have been made:  no change  Labs/ tests ordered today include:   Orders Placed This Encounter  Procedures  . Hepatic function panel  . Lipid Profile  . EKG 12-Lead    Disposition:   FU with Dr. Bryan Lemma  in 6 weeks   Signed, Brynda Rim, MHS 10/01/2014 9:46 AM    [facility] [address] [place], [place], [place]  [address] Phone: [phone]; Fax: [phone]

## 2014-11-05 ENCOUNTER — Telehealth: Payer: Self-pay | Admitting: *Deleted

## 2014-11-05 ENCOUNTER — Other Ambulatory Visit (INDEPENDENT_AMBULATORY_CARE_PROVIDER_SITE_OTHER): Payer: Self-pay | Admitting: *Deleted

## 2014-11-05 DIAGNOSIS — E785 Hyperlipidemia, unspecified: Secondary | ICD-10-CM

## 2014-11-05 LAB — LIPID PANEL
Cholesterol: 107 mg/dL (ref 0–200)
HDL: 28.2 mg/dL — ABNORMAL LOW (ref >39.00)
LDL Cholesterol: 42 mg/dL (ref 0–99)
NonHDL: 78.8
Total CHOL/HDL Ratio: 4
Triglycerides: 182 mg/dL — ABNORMAL HIGH (ref 0.0–149.0)
VLDL: 36.4 mg/dL (ref 0.0–40.0)

## 2014-11-05 LAB — HEPATIC FUNCTION PANEL
ALT: 21 U/L (ref 0–53)
AST: 17 U/L (ref 0–37)
Albumin: 4.7 g/dL (ref 3.5–5.2)
Alkaline Phosphatase: 28 U/L — ABNORMAL LOW (ref 39–117)
Bilirubin, Direct: 0.2 mg/dL (ref 0.0–0.3)
Total Bilirubin: 0.6 mg/dL (ref 0.2–1.2)
Total Protein: 7.1 g/dL (ref 6.0–8.3)

## 2014-11-05 NOTE — Progress Notes (Signed)
Cardiology Office Note   Date:  11/06/2014   ID:  Dennis Bates, DOB 1961/04/25, MRN [id number]  PCP:  Bufford SpikesMaynard, Jennifer S, NP  Cardiologist:  Dr. Herbie BaltimoreHarding     dizziness and sx similar to prior to recent MI 09/2014.    History of Present Illness: Dennis Bates is a 54 y.o. male with a history of HLD, hypertriglyceridemia, bells palsy, DM-2, severe AS with probable bicuspid AV, CAD s/p inflat STEMI s/p DES to OM2 (09/20/2014) who was added to the flex schedule due to "dizziness and a rush of blood feeling while sitting and talking. Pt states he felt like this before his recent MI"  He was admitted 1/8-1/11/16 with an inferolateral STEMI. LHC demonstrated critical stenosis in the OM2 that was treated with a DES. Patient was noted to have significant hypertriglyceridemia. Fenofibrate was added and high-dose statin. Echocardiogram demonstrated moderate to severe aortic stenosis with probable bicuspid aortic valve. It was felt that he would likely need AVR in the next 2-3 years.  He was seen for post hospital follow up by Tereso NewcomerScott Weaver PA-C on 09/30/14 and felt to be doing well. He was added on to my schedule today due to pre-syncopal episodes.  Since seeing Lorin PicketScott, he has been having some instances of dizziness and feeling like he was having a rush of a blood to the head. He never actually looses consciousness and the episodes resolve within a minute. These episodes occur at rest. He became worried because he had similar episodes prior to his MI. He denies recurrent chest discomfort, but maybe gets a funny feeling in his chest from time to time. The chest discomfort is nothing like the day he presented with STEMI. He sometimes feels SOB when laying down. He has been walking for exercise but this sometimes makes him feel completely wiped out and extremely fatigued. Wants to know if this is normal. No exertional CP or SOB. He wants to do cardiac rehab but he hasnt received a call from them yet.  He  denies PND or edema.    Past Medical History  Diagnosis Date  . Hyperlipidemia   . Diabetes   . Aortic stenosis     Probable bicuspid aortic valve >> Echo 09/20/14: Moderate LVH, EF 55-60%, normal wall motion, grade 1 diastolic dysfunction, probable bicuspid aortic valve with moderate to severe aortic stenosis (mean 34 mmHg), mild MR, mild TR, PASP 32 mmHg  . Bell's palsy   . Aortic stenosis due to bicuspid aortic valve     a. mod-sev by 2D ECHO 09/2014. AVA 0.9cm^2  . Hypertriglyceridemia   . CAD (coronary artery disease)     a. inferolat STEMI 09/20/14 >> LHC 09/20/14: Proximal/ostial LAD 20%, OM2 99% >> PCI: 3 x 16 mm Promus Premier DES to the OM2.    Past Surgical History  Procedure Laterality Date  . Cardiac catheterization      remote h/o cath >15 yrs ago, unkown  . Doppler echocardiography    . Left heart catheterization with coronary angiogram N/A 09/20/2014    Procedure: LEFT HEART CATHETERIZATION WITH CORONARY ANGIOGRAM;  Surgeon: Marykay Lexavid W Harding, MD;  [facility];  Service: Cardiovascular;  Laterality: N/A;     Current Outpatient Prescriptions  Medication Sig Dispense Refill  . aspirin EC 81 MG EC tablet Take 1 tablet (81 mg total) by mouth daily.    Marland Kitchen. atorvastatin (LIPITOR) 80 MG tablet Take 1 tablet (80 mg total) by mouth daily. 30 tablet 11  .  carvedilol (COREG) 3.125 MG tablet Take 1 tablet (3.125 mg total) by mouth 2 (two) times daily with a meal. 60 tablet 11  . fenofibrate 160 MG tablet Take 1 tablet (160 mg total) by mouth daily. 30 tablet 11  . glipiZIDE (GLUCOTROL XL) 5 MG 24 hr tablet Take 1 tablet (5 mg total) by mouth daily with breakfast. 30 tablet 11  . metFORMIN (GLUCOPHAGE) 1000 MG tablet Take 500 mg by mouth 2 (two) times daily with a meal.     . prasugrel (EFFIENT) 10 MG TABS tablet Take 1 tablet (10 mg total) by mouth daily. 30 tablet 11  . sitaGLIPtin (JANUVIA) 100 MG tablet Take 100 mg by mouth daily.    . nitroGLYCERIN (NITROSTAT) 0.4 MG SL  tablet Place 1 tablet (0.4 mg total) under the tongue every 5 (five) minutes x 3 doses as needed for chest pain. (Patient not taking: Reported on 11/06/2014) 25 tablet 12   No current facility-administered medications for this visit.    Allergies:   Review of patient's allergies indicates no known allergies.    Social History:  The patient  reports that he has never smoked. He does not have any smokeless tobacco history on file. He reports that he does not drink alcohol or use illicit drugs.   Family History:  The patient's family history is negative for CAD.    ROS:  Please see the history of present illness.   Otherwise, review of systems are positive for none.   All other systems are reviewed and negative.    PHYSICAL EXAM: VS:  BP 110/70 mmHg  Pulse 76  Ht  (1.702 m)  Wt 183 lb (83.008 kg)  BMI 28.66 kg/m2 , BMI Body mass index is 28.66 kg/(m^2). GEN: Well nourished, well developed, in no acute distress HEENT: normal Neck: no JVD, carotid bruits, or masses Cardiac: RRR; 2/6 systolic ejection murmur heard best at the right upper sternal border, rubs, or gallops,no edema  Respiratory:  clear to auscultation bilaterally, normal work of breathing GI: soft, nontender, nondistended, + BS MS: no deformity or atrophy Skin: warm and dry, no rash Neuro:  Strength and sensation are intact Psych: euthymic mood, full affect   EKG:  EKG is ordered today. The ekg ordered today demonstrates NSR HR 71. No acute ST or TW changes.    Recent Labs: 09/20/2014: TSH 3.113 09/21/2014: Hemoglobin 13.8; Platelets 259 09/23/2014: BUN 11; Creatinine 0.82; Potassium 3.8; Sodium 137 11/05/2014: ALT 21    Lipid Panel    Component Value Date/Time   CHOL 107 11/05/2014 0841   TRIG 182.0* 11/05/2014 0841   HDL 28.20* 11/05/2014 0841   CHOLHDL 4 11/05/2014 0841   VLDL 36.4 11/05/2014 0841   LDLCALC 42 11/05/2014 0841   LDLDIRECT 43 09/21/2014 1108      Wt Readings from Last 3 Encounters:    11/06/14 183 lb (83.008 kg)  10/01/14 186 lb 1.9 oz (84.423 kg)  09/20/14 188 lb 7.9 oz (85.5 kg)      Other studies Reviewed: LHC 09/20/14: Proximal/ostial LAD 20%, OM2 99% >> PCI: 3 x 16 mm Promus Premier DES to the OM2. Echo 09/20/14: Moderate LVH, EF 55-60%, normal wall motion, grade 1 diastolic dysfunction, probable bicuspid aortic valve with moderate to severe aortic stenosis (mean 34 mmHg), mild MR, mild TR, PASP 32 mmHg    ASSESSMENT AND PLAN: Kurtis Anastasia is a 54 y.o. male with a history of HLD, hypertriglyceridemia, bells palsy, DM-2, severe AS with probably bicuspid  AV, CAD s/p inflat STEMI s/p DES to OM2 (09/20/2014) who was added to the flex schedule due to "dizziness and a rush of blood feeling while sitting and talking. Pt states he felt like this before his recent MI"  Coronary Artery Disease: s/p DES to OM2 (09/20/2014)  -- ECG stable today. No s/s ischemia. -- Continue aspirin, Effient, statin, beta blocker.  -- Waiting for cardiac rehab to contact him. We have called cardiac rehab to make sure the follow up with him.   Aortic Stenosis: 2D ECHO 09/2014 w/ probable bicuspid aortic valve with moderate to severe aortic stenosis (mean 34 mmHg) -- Reviewed of records indicates that the patient will likely need aortic valve replacement at some point in the near future. Hopefully this will be after he has completed 1 year of DAPT after STEMI.  -- Having some dizziness. We will get a limited 2D ECHO with special attention to AV.  Dizziness- will place a 30 day event monitor to make sure these epsiodes are not arrhythmogenic. As above will proceed with limited 2D ECHO to get a closer look at aortic valve.   Hyperlipidemia: Continue fenofibrate and Lipitor. --Lipid panel on 11/05/14 w/ TC 107, LDL 42, TG 182, HDL 28. ( TG previously >900.)  LFTs WNL  Diabetes Mellitus: Follow-up with primary care. He needs help with DM meds management as januvia >400$ month. I have told him to  follow up with his PCP.   HTN- BP well controlled today. Continue coreg 3.125mg  BID.   Current medicines are reviewed at length with the patient today.  The patient does not have concerns regarding medicines.  The following changes have been made:  no change  Labs/ tests ordered today include:   Orders Placed This Encounter  Procedures  . EKG 12-Lead  . Cardiac event monitor  . 2D Echocardiogram with contrast      Disposition:   FU with Dr. [name] next week as previously scheduled.    Charlestine Massed  11/06/2014 9:14 AM    [facility] [address] [place], [place], [place]  [address] Phone: [phone]; Fax: [phone]

## 2014-11-05 NOTE — Telephone Encounter (Signed)
pt notified of lab results with verbal understanding. Pt states to me that he has on and off for the last couple of weeks dizziness and a rush of blood feeling while sitting and talking. Pt states he felt like this before his MI 09/2014. Pt on Flex sched.

## 2014-11-06 ENCOUNTER — Ambulatory Visit (INDEPENDENT_AMBULATORY_CARE_PROVIDER_SITE_OTHER): Payer: Self-pay | Admitting: Physician Assistant

## 2014-11-06 ENCOUNTER — Encounter: Payer: Self-pay | Admitting: Physician Assistant

## 2014-11-06 VITALS — BP 110/70 | HR 76 | Ht 67.0 in | Wt 183.0 lb

## 2014-11-06 DIAGNOSIS — I350 Nonrheumatic aortic (valve) stenosis: Secondary | ICD-10-CM

## 2014-11-06 DIAGNOSIS — R079 Chest pain, unspecified: Secondary | ICD-10-CM

## 2014-11-06 DIAGNOSIS — R55 Syncope and collapse: Secondary | ICD-10-CM

## 2014-11-06 NOTE — Patient Instructions (Addendum)
Your physician recommends that you continue on your current medications as directed. Please refer to the Current Medication list given to you today   Your physician has recommended that you wear an event monitor. Event monitors are medical devices that record the heart's electrical activity. Doctors most often us these monitors to diagnose arrhythmias. Arrhythmias are problems with the speed or rhythm of the heartbeat. The monitor is a small, portable device. You can wear one while you do your normal daily activities. This is usually used to diagnose what is causing palpitations/syncope (passing out).    Your physician has requested that you have an echocardiogram. Echocardiography is a painless test that uses sound waves to create images of your heart. It provides your doctor with information about the size and shape of your heart and how well your heart's chambers and valves are working. This procedure takes approximately one hour. There are no restrictions for this procedure.   KEEP FOLLOW UP APPOINTMENT THAT ALREADY SCHEDULED WITH DR Herbie BaltimoreHARDING

## 2014-11-12 ENCOUNTER — Ambulatory Visit (HOSPITAL_COMMUNITY): Payer: Self-pay | Attending: Cardiology | Admitting: Cardiology

## 2014-11-12 ENCOUNTER — Encounter: Payer: Self-pay | Admitting: *Deleted

## 2014-11-12 DIAGNOSIS — I350 Nonrheumatic aortic (valve) stenosis: Secondary | ICD-10-CM | POA: Insufficient documentation

## 2014-11-12 NOTE — Progress Notes (Signed)
Limited echo performed. 

## 2014-11-12 NOTE — Progress Notes (Signed)
Patient ID: Dennis Bates, male   DOB: Apr 26, 1961, 54 y.o.   MRN: [id number] Patient to reschedule appointment to have a 30 day cardiac event monitor applied pending verification of new insurance activation.

## 2014-11-12 NOTE — Progress Notes (Signed)
Not sure how the gradient changed that fast - unless his LV function improved with PCI.  Ok - will see him next week.    DH

## 2014-11-19 ENCOUNTER — Ambulatory Visit (INDEPENDENT_AMBULATORY_CARE_PROVIDER_SITE_OTHER): Payer: Self-pay | Admitting: Cardiology

## 2014-11-19 ENCOUNTER — Encounter: Payer: Self-pay | Admitting: Cardiology

## 2014-11-19 VITALS — BP 118/72 | HR 88 | Ht 67.0 in | Wt 189.7 lb

## 2014-11-19 DIAGNOSIS — E785 Hyperlipidemia, unspecified: Secondary | ICD-10-CM

## 2014-11-19 DIAGNOSIS — I2119 ST elevation (STEMI) myocardial infarction involving other coronary artery of inferior wall: Secondary | ICD-10-CM

## 2014-11-19 DIAGNOSIS — IMO0002 Reserved for concepts with insufficient information to code with codable children: Secondary | ICD-10-CM

## 2014-11-19 DIAGNOSIS — I350 Nonrheumatic aortic (valve) stenosis: Secondary | ICD-10-CM

## 2014-11-19 DIAGNOSIS — E1165 Type 2 diabetes mellitus with hyperglycemia: Secondary | ICD-10-CM

## 2014-11-19 DIAGNOSIS — Z79899 Other long term (current) drug therapy: Secondary | ICD-10-CM

## 2014-11-19 DIAGNOSIS — Z9861 Coronary angioplasty status: Secondary | ICD-10-CM

## 2014-11-19 DIAGNOSIS — Q230 Congenital stenosis of aortic valve: Secondary | ICD-10-CM

## 2014-11-19 DIAGNOSIS — E781 Pure hyperglyceridemia: Secondary | ICD-10-CM

## 2014-11-19 DIAGNOSIS — Q231 Congenital insufficiency of aortic valve: Secondary | ICD-10-CM

## 2014-11-19 DIAGNOSIS — I2510 Atherosclerotic heart disease of native coronary artery without angina pectoris: Secondary | ICD-10-CM

## 2014-11-19 NOTE — Progress Notes (Signed)
PCP: Bufford Spikes, NP  Clinic Note: Chief Complaint  Patient presents with  . Follow-up     results echo , no chest discomfort, sob at rest and activity  . Code STEMI  . Coronary Artery Disease  . Aortic Stenosis    bicuspid aortic valve   HPI: Dennis Bates is a 54 y.o. male with a PMH below who presents today for thirdhospital followup visit after his inferolateral STEMI -- PCI-OM 2.    He has a documented history of likely bicuspid aortic valve, but had not been evaluated for some time. His peri-infarct echo suggested moderate-severe stenosis. His prodromal symptoms to his MI angina included a near syncopal type symptoms of flushing and lightheadedness on with seeing spots over his eyes. He had a couple of episodes prior to the onset of his substernal chest pain radiating to the jaw and shoulder. He apparently had intermittent episodes of chest discomfort which is not severity. He was also noted to have significant hypertriglyceridemia and started on fenofibrate along with high-dose statin prior to discharge.  He was seen by Thomasenia Bottoms, PA-C. On January 19 for his initial followup and was doing very well. He was noticing some mild exertional dyspnea after finishing a walk with would not have any discomfort or syncope/near syncope.  He then saw Jolaine Artist, PA-C about one month later for evaluation of dizziness and near syncopal type symptoms are very similar to his prodromal symptoms from his MI. He noted at least 2 episodes of this lightheadedness and flushing with near syncope. They are all occurring at rest.  He has lost consciousness.  He was evaluated with a followup echocardiogram that has now shown his aortic stenosis to the severe (I reviewed the report and discuss findings with the bleeding physician who feels that the current study is more likely to be accurate based on the quality of the study and the extent of data provided with Doppler studies). In our chart as the  discuss the results of his echocardiogram.  He has not had any further episodes of near syncope or flushing. He just feels tired quite a bit in to go for about 4-4/2 hours without feeling like he needs to stop and maybe even take a nap. He denies any heart failure type symptoms of PND, orthopnea or edema. He's not had any recurrent anginal chest tightness or pressure with either rest or exertion. He is still trying to do exercise. He has not had any TIA or amaurosis fugax symptoms. No rapid or irregular heartbeat/palpitations.  Past Medical History  Diagnosis Date  . Hyperlipidemia   . Diabetes mellitus type 2, controlled, with complications   . Bell's palsy   . Aortic stenosis due to bicuspid aortic valve     Probable bicuspid aortic valve: a. mod-sev by 2D ECHO 09/2014. AVA 0.9cm^2; b) 10/2014 Echo:. Severe AS Mn-Pk Gradient 41-71 mmHg, AVA ~0.79 cm2, EF 60-65%  . Hypertriglyceridemia   . CAD S/P percutaneous coronary angioplasty 09/20/2014    a. inferolat STEMI ->> LHC-Angio: Proximal/ostial LAD 20%, OM2 99% -->> PCI: 3mm x 16 mm Promus Premier DES to the OM2.(~3.5 mm)  . ST elevation myocardial infarction (STEMI) of inferior wall 09/20/2014    Prior Cardiac Evaluation and Past Surgical History: Past Surgical History  Procedure Laterality Date  . Cardiac catheterization      remote h/o cath >15 yrs ago, unkown  . Doppler echocardiography  January 2016, February2016    Probable bicuspid aortic valve: a. mod-sev by  2D ECHO 09/2014. AVA 0.9cm^2; b) 10/2014 Echo:. Severe AS Mn-Pk Gradient 41-71 mmHg, AVA ~0.79 cm2, EF 60-65%  . Left heart catheterization with coronary angiogram N/A 09/20/2014    Procedure: LEFT HEART CATHETERIZATION WITH CORONARY ANGIOGRAM;  Surgeon: Marykay Lex, MD;  Location: [facility] CATH [facility];  Service: Cardiovascular;  Laterality: N/A;  . Percutaneous coronary stent intervention (pci-s)  09/20/2014    PTCI to OM 2 with Promus Premier DES 3.0 mm x 16 mm (3.5 mm)   ROS: A  comprehensive was performed. Review of Systems  Constitutional: Positive for malaise/fatigue (still gets tired at about 4:00 hours.).  HENT: Negative for nosebleeds.   Eyes: Negative for blurred vision and double vision.  Respiratory: Negative for cough, shortness of breath and wheezing.   Cardiovascular: Negative for chest pain and claudication.  Gastrointestinal: Positive for nausea. Negative for blood in stool and melena.  Genitourinary: Negative for hematuria and flank pain.  Musculoskeletal: Positive for joint pain. Negative for myalgias.  Neurological: Positive for dizziness. Negative for sensory change, speech change, focal weakness and loss of consciousness.       2 episodes of near syncopal type symptoms as indicated above.  Endo/Heme/Allergies: Does not bruise/bleed easily.  All other systems reviewed and are negative.   Current Outpatient Prescriptions on File Prior to Visit  Medication Sig Dispense Refill  . aspirin EC 81 MG EC tablet Take 1 tablet (81 mg total) by mouth daily.    Marland Kitchen atorvastatin (LIPITOR) 80 MG tablet Take 1 tablet (80 mg total) by mouth daily. 30 tablet 11  . carvedilol (COREG) 3.125 MG tablet Take 1 tablet (3.125 mg total) by mouth 2 (two) times daily with a meal. 60 tablet 11  . fenofibrate 160 MG tablet Take 1 tablet (160 mg total) by mouth daily. 30 tablet 11  . glipiZIDE (GLUCOTROL XL) 5 MG 24 hr tablet Take 1 tablet (5 mg total) by mouth daily with breakfast. 30 tablet 11  . metFORMIN (GLUCOPHAGE) 1000 MG tablet Take 500 mg by mouth 2 (two) times daily with a meal.     . nitroGLYCERIN (NITROSTAT) 0.4 MG SL tablet Place 1 tablet (0.4 mg total) under the tongue every 5 (five) minutes x 3 doses as needed for chest pain. 25 tablet 12  . prasugrel (EFFIENT) 10 MG TABS tablet Take 1 tablet (10 mg total) by mouth daily. 30 tablet 11  . sitaGLIPtin (JANUVIA) 100 MG tablet Take 100 mg by mouth daily.     No current facility-administered medications on file prior  to visit.   History  Substance Use Topics  . Smoking status: Never Smoker   . Smokeless tobacco: Not on file  . Alcohol Use: No   Family History  Problem Relation Age of Onset  . CAD Neg Hx     per wife    Wt Readings from Last 3 Encounters:  11/19/14 189 lb 11.2 oz (86.047 kg)  11/06/14 183 lb (83.008 kg)  10/01/14 186 lb 1.9 oz (84.423 kg)    PHYSICAL EXAM BP 118/72 mmHg  Pulse 88  Ht  (1.702 m)  Wt 189 lb 11.2 oz (86.047 kg)  BMI 29.70 kg/m2 General appearance: alert, cooperative, appears stated age, no distress and bordreline obese HEENT: Dixon/AT, EOMI, MMM, anicteric scleraNeck: no adenopathy, no carotid bruit and no JVD Lungs: clear to auscultation bilaterally, normal percussion bilaterally and non-labored Heart: RRR, Nomal S1, S2 is somewhat muffled by 3/6 harsh, early-peaking SEM @ RUSB (but herard throughout) --> carotids.  No R/G.; non-displaced PMI Abdomen: soft, non-tender; bowel sounds normal; no masses,  no organomegaly; no HJR Extremities: extremities normal, atraumatic, no cyanosis or edema  Pulses: 2+ and symmetric;  Skin: normal and mobility and turgor normal  Neurologic: Mental status: Alert, oriented, thought content appropriate; Normal Mood & affect; Cranial nerves: normal (II-XII grossly intact)   Adult ECG Report Not performed.  Recent Labs:  Reviewed in Epic; dramatic drop in LDL/ TG & TC Lab Results  Component Value Date   CHOL 107 11/05/2014   HDL 28.20* 11/05/2014   LDLCALC 42 11/05/2014   LDLDIRECT 43 09/21/2014   TRIG 182.0* 11/05/2014   CHOLHDL 4 11/05/2014     Chemistry      Component Value Date/Time   NA 137 09/23/2014 0337   K 3.8 09/23/2014 0337   CL 106 09/23/2014 0337   CO2 24 09/23/2014 0337   BUN 11 09/23/2014 0337   CREATININE 0.82 09/23/2014 0337      Component Value Date/Time   CALCIUM 8.7 09/23/2014 0337   ALKPHOS 28* 11/05/2014 0841   AST 17 11/05/2014 0841   ALT 21 11/05/2014 0841   BILITOT 0.6 11/05/2014  0841      ASSESSMENT / PLAN: ST elevation myocardial infarction (STEMI) of inferolateral wall No recurrent anginal symptoms on. He is concerned that his  Lightheaded episodes with almost near syncope or similar to the preceding symptoms of his MI. He is not having exertional symptoms or heart failure symptoms. He does have a third-generation DES stent placed  And is currently on aspirin plus Effient. Based on European trials, we could potentially be okay with short-term suspension of dual antiplatelet therapy for surgery if required after 3 months, however I would prefer to try to make as far out as possible. Preferably greater than 6 months.  He is on a statin, which I will reduce the dose up today. He is on a low dose of carvedilol which for now with his lightheadedness, I'm reluctant to increase. Thankfully, his EF is preserved.   Aortic stenosis due to bicuspid aortic valve Initial peri-MI EF by echo was slightly lower than currently is. The aortic valve was read as being moderate-severely stenosed, however it I discussed the findings with Dr. Rennis GoldenHilty who actually read the outpatient echo most recently and felt that the Doppler velocities on the outpatient study were far superior to the inpatient study. He felt that there is also a potential for increased/improve cardiac function having increased the cardiac output and therefore an increasing the gradient.  Regardless, the most recent echo suggested that his stenosis is now severe. With him having these mild borderline near syncopal episodes, is unclear whether these episodes are related to anxiety from MI, or addition of beta blocker leading to mild orthostatic symptoms versus related to aortic stenosis. He is not overly symptomatic, and the valve is not critically severe.  I will forward my note to Dr. [name] from cardiac surgery for review and for potential advice as to timing of when he would consider AVR.  Most likely he would prefer to wait  as far away from MI and PCI as possible.  Plan for now is to recheck echocardiogram in 6 months and monitor for symptoms in the interim. I will see him back in 3 months. We discussed concerning symptoms such as angina syncope/near syncope and heart failure symptoms which would potentially expedite the process of considering referral for valve replacement.   CAD S/P p DES PCI to the Circumflex-OM  2 Currently on dual antiplatelet therapy. No bleeding concerns. On low-dose beta blocker as well as statin. See Statements above reference likely be safe with short-term suspension of antiplatelet agent for surgery after 3-6 months   Dyslipidemia, goal LDL below 70 Currently on atorvastatin 80 mg. Also on fenofibrate.  Significant drop in LDL and total cholesterol from initial studies. Also dramatic drop in triglycerides. I think we're safe now backing down to the 40 mg of atorvastatin since we are beyond the stage of pleiotrophic benefits of high dose statin (especially in combo with fibrate).  followup labs in roughly 3 months. - With combination, we'll clearly need lipid panel and LFTs.    Hypertriglyceridemia Much improved with combination of statin and fenofibrate. Triglycerides are down from over 900 to less than 200    Orders Placed This Encounter  Procedures  . Lipid panel    Order Specific Question:  Has the patient fasted?    Answer:  Yes  . Comprehensive metabolic panel    Order Specific Question:  Has the patient fasted?    Answer:  Yes  . 2D Echocardiogram without contrast    Standing Status: Future     Number of Occurrences:      Standing Expiration Date: 11/19/2015    Order Specific Question:  Type of Echo    Answer:  Complete    Order Specific Question:  Where should this test be performed    Answer:  [facility]    Order Specific Question:  Reason for exam-Echo    Answer:  Aortic Valve Disorder 424.1 / I35.9   No orders of the defined types were placed in this  encounter.   Prolonged visit with > 50% of 45 min spent with counseling - discussing future plans of care & concerning symptoms.  Multiple patient questions were answered.  Personally reviewed echocardiogram reports and discussed findings with colleagues. I reviewed the cardiac catheterization report performed by me as well as the 2 physician extender followup clinic notes  Followup: ~3 months   Caragh Gasper, Piedad Climes, M.D., M.S. Interventional Cardiologist   Pager # [phone]

## 2014-11-19 NOTE — Patient Instructions (Addendum)
SEPT 2016--Your physician has requested that you have an echocardiogram. Echocardiography is a painless test that uses sound waves to create images of your heart. It provides your doctor with information about the size and shape of your heart and how well your heart's chambers and valves are working. This procedure takes approximately one hour. There are no restrictions for this procedure.  TAKE 1/2 TABLET OF LIPITOR  DAILY.  LABS -MAY 2016- [facility], L[facility]  ( [address])   WILL CALL YOU ONCE DR HARDING DISCUSS INFORMATION WITH DR [name].  Your physician wants you to follow-up in June/July 2016-- Dr Herbie BaltimoreHarding.30 min appt.  You will receive a reminder letter in the mail two months in advance. If you don't receive a letter, please call our office to schedule the follow-up appointment.

## 2014-11-20 ENCOUNTER — Encounter: Payer: Self-pay | Admitting: Cardiology

## 2014-11-20 NOTE — Assessment & Plan Note (Signed)
Hemoglobin A1c in the hospital was 9.1. He is now on glipizide and metformin along with Januvia.  Being closely monitored by PCP.

## 2014-11-20 NOTE — Assessment & Plan Note (Signed)
No recurrent anginal symptoms on. He is concerned that his  Lightheaded episodes with almost near syncope or similar to the preceding symptoms of his MI. He is not having exertional symptoms or heart failure symptoms. He does have a third-generation DES stent placed  And is currently on aspirin plus Effient. Based on European trials, we could potentially be okay with short-term suspension of dual antiplatelet therapy for surgery if required after 3 months, however I would prefer to try to make as far out as possible. Preferably greater than 6 months.  He is on a statin, which I will reduce the dose up today. He is on a low dose of carvedilol which for now with his lightheadedness, I'm reluctant to increase. Thankfully, his EF is preserved.

## 2014-11-20 NOTE — Assessment & Plan Note (Signed)
Initial peri-MI EF by echo was slightly lower than currently is. The aortic valve was read as being moderate-severely stenosed, however it I discussed the findings with Dr. Rennis GoldenHilty who actually read the outpatient echo most recently and felt that the Doppler velocities on the outpatient study were far superior to the inpatient study. He felt that there is also a potential for increased/improve cardiac function having increased the cardiac output and therefore an increasing the gradient.  Regardless, the most recent echo suggested that his stenosis is now severe. With him having these mild borderline near syncopal episodes, is unclear whether these episodes are related to anxiety from MI, or addition of beta blocker leading to mild orthostatic symptoms versus related to aortic stenosis. He is not overly symptomatic, and the valve is not critically severe.  I will forward my note to Dr. [name] from cardiac surgery for review and for potential advice as to timing of when he would consider AVR.  Most likely he would prefer to wait as far away from MI and PCI as possible.  Plan for now is to recheck echocardiogram in 6 months and monitor for symptoms in the interim. I will see him back in 3 months. We discussed concerning symptoms such as angina syncope/near syncope and heart failure symptoms which would potentially expedite the process of considering referral for valve replacement.

## 2014-11-20 NOTE — Assessment & Plan Note (Addendum)
Currently on atorvastatin 80 mg. Also on fenofibrate.  Significant drop in LDL and total cholesterol from initial studies. Also dramatic drop in triglycerides. I think we're safe now backing down to the 40 mg of atorvastatin since we are beyond the stage of pleiotrophic benefits of high dose statin (especially in combo with fibrate).  followup labs in roughly 3 months. - With combination, we'll clearly need lipid panel and LFTs.

## 2014-11-20 NOTE — Assessment & Plan Note (Addendum)
Currently on dual antiplatelet therapy. No bleeding concerns. On low-dose beta blocker as well as statin. See Statements above reference likely be safe with short-term suspension of antiplatelet agent for surgery after 3-6 months

## 2014-11-20 NOTE — Assessment & Plan Note (Signed)
Much improved with combination of statin and fenofibrate. Triglycerides are down from over 900 to less than 200

## 2014-12-06 ENCOUNTER — Telehealth: Payer: Self-pay | Admitting: Cardiology

## 2014-12-06 NOTE — Telephone Encounter (Signed)
I did talk with Dr. [name].  He indicated that he would like to see if we can put off surgery for at least 6 months from the MI.  We can go ahead & refer him for a visit, but can probably wait until after I see him in a few months.   DH

## 2014-12-06 NOTE — Telephone Encounter (Signed)
Pt called in and lmsg on my VM inquiring about a referral to Dr. Sharee PimpleBartle's office, he states that he has not heard anything from his office yet and would like to know what to do next. Please f/u with pt  Thanks

## 2014-12-06 NOTE — Telephone Encounter (Signed)
Spoke to patient Will have to discuss with Dr Herbie BaltimoreHarding ? Patient aware Dr Herbie BaltimoreHarding is out of the office until 12/16/14 ,will contact patient.

## 2014-12-10 NOTE — Telephone Encounter (Signed)
SPOKE TO PATIENT  HE IS AWARE - WILL AWAIT UNTIL HE SEES DR Herbie BaltimoreHARDING IN [place] Pe[place]UN[place] /JULY 2016 3 MONTH FOLLOW UP AFTER THAT WILL MAKE AN  APPOINTMENT WITH DR [name]

## 2014-12-10 NOTE — Telephone Encounter (Signed)
LEFT MESSAGE TO CALL BACK IN REGARDS  TO APPOINTMENT

## 2014-12-17 ENCOUNTER — Encounter (HOSPITAL_COMMUNITY): Payer: Self-pay | Admitting: *Deleted

## 2015-03-19 ENCOUNTER — Telehealth: Payer: Self-pay | Admitting: Cardiology

## 2015-03-19 DIAGNOSIS — Q231 Congenital insufficiency of aortic valve: Principal | ICD-10-CM

## 2015-03-19 DIAGNOSIS — Q230 Congenital stenosis of aortic valve: Secondary | ICD-10-CM

## 2015-03-19 NOTE — Telephone Encounter (Signed)
That's fine if he just wants to see Dr. [name]. He will eventually need follow-up for his CAD. But we can wait to see what Dr. [name] need, and they are going to consider surgery. He may need additional cardiac evaluation.  DH

## 2015-03-19 NOTE — Telephone Encounter (Signed)
Returned call to patient he stated he would like to see Dr.Bartle instead of coming to see Dr.Harding since he does not have insurance.Advised I will send message to Dr.Harding for advice.

## 2015-03-19 NOTE — Telephone Encounter (Signed)
Patient calling regarding referral to Dr. [name].  Explained it looks like needs appt with dr. Herbie BaltimoreHarding June or July [facility]  Pt states he does not have insurance and is trying to save money.  Can he see Bartle without appt with Dr. Herbie BaltimoreHarding?

## 2015-03-19 NOTE — Telephone Encounter (Signed)
Returned call to patient no answer.Left message on personal voice mail Dr.Harding advised ok to see Dr.Bartle.Advised will eventually need follow up with him for CAD.Advised will put in order for appointment with Dr.Bartle,their office should contact you in the next several days.

## 2015-04-02 ENCOUNTER — Institutional Professional Consult (permissible substitution) (INDEPENDENT_AMBULATORY_CARE_PROVIDER_SITE_OTHER): Payer: Self-pay | Admitting: Surgery

## 2015-04-02 ENCOUNTER — Encounter: Payer: Self-pay | Admitting: Surgery

## 2015-04-02 VITALS — BP 134/90 | HR 70 | Resp 20 | Ht 67.5 in | Wt 185.0 lb

## 2015-04-02 DIAGNOSIS — I350 Nonrheumatic aortic (valve) stenosis: Secondary | ICD-10-CM

## 2015-04-02 DIAGNOSIS — Q231 Congenital insufficiency of aortic valve: Secondary | ICD-10-CM

## 2015-04-06 ENCOUNTER — Encounter: Payer: Self-pay | Admitting: Surgery

## 2015-04-06 NOTE — Progress Notes (Signed)
Cardiothoracic Surgery Consultation   PCP is Bufford Spikes, NP Referring Provider is Marykay Lex, MD  Chief Complaint  Patient presents with  . Aortic Stenosis    Surgical eval, Cardiac Cath 09/20/14, 2D Echo 11/12/14    HPI:  The patient is a 54 year old gentleman with type 2 DM, hyperlipidemia, CAD s/p inferior STEMI and PCI/DES to a 99% OM2 09/2014, known bicuspid aortic valve stenosis. When he saw cardiology on 10/01/2014 he reported some mild exertional dyspnea with walks. About a month later he was evaluated for dizziness and near syncope that was similar to his prodromal symptoms at the time of his MI. He noted at least two episodes of dizziness and flushing with near syncope that occurred at rest. His last echo on 11/12/2014 showed severe AS with a peak velocity of 421 cm/sec corresponding to a valve area of 0.79 cm2. The ascending aorta was 46 mm. LVEF was 60-65%. Mean AV gradient was 41 mm Hg. DI was 0.2. Since then he has not had any further episodes of dizziness but continues to have exertional fatigue and shortness of breath. Since he was still on Effient post DES he was observed but now is referred for surgical evaluation. He does report occasional episodes of left chest dull aching that is not always related to exertion.   Past Medical History  Diagnosis Date  . Hyperlipidemia   . Diabetes mellitus type 2, controlled, with complications   . Bell's palsy   . Aortic stenosis due to bicuspid aortic valve     Probable bicuspid aortic valve: a. mod-sev by 2D ECHO 09/2014. AVA 0.9cm^2; b) 10/2014 Echo:. Severe AS Mn-Pk Gradient 41-71 mmHg, AVA ~0.79 cm2, EF 60-65%  . Hypertriglyceridemia   . CAD S/P percutaneous coronary angioplasty 09/20/2014    a. inferolat STEMI ->> LHC-Angio: Proximal/ostial LAD 20%, OM2 99% -->> PCI: 3mm x 16 mm Promus Premier DES to the OM2.(~3.5 mm)  . ST elevation myocardial infarction (STEMI) of inferior wall 09/20/2014    Past Surgical History    Procedure Laterality Date  . Cardiac catheterization      remote h/o cath >15 yrs ago, unkown  . Doppler echocardiography  January 2016, February2016    Probable bicuspid aortic valve: a. mod-sev by 2D ECHO 09/2014. AVA 0.9cm^2; b) 10/2014 Echo:. Severe AS Mn-Pk Gradient 41-71 mmHg, AVA ~0.79 cm2, EF 60-65%  . Left heart catheterization with coronary angiogram N/A 09/20/2014    Procedure: LEFT HEART CATHETERIZATION WITH CORONARY ANGIOGRAM;  Surgeon: Marykay Lex, MD;  Location: [facility] CATH LAB;  Service: Cardiovascular;  Laterality: N/A;  . Percutaneous coronary stent intervention (pci-s)  09/20/2014    PTCI to OM 2 with Promus Premier DES 3.0 mm x 16 mm (3.5 mm)    Family History  Problem Relation Age of Onset  . CAD Neg Hx     per wife    Social History History  Substance Use Topics  . Smoking status: Never Smoker   . Smokeless tobacco: Not on file  . Alcohol Use: No    Current Outpatient Prescriptions  Medication Sig Dispense Refill  . aspirin EC 81 MG EC tablet Take 1 tablet (81 mg total) by mouth daily.    Marland Kitchen atorvastatin (LIPITOR) 80 MG tablet Take 1 tablet (80 mg total) by mouth daily. (Patient taking differently: Take 40 mg by mouth daily. ) 30 tablet 11  . carvedilol (COREG) 3.125 MG tablet Take 1 tablet (3.125 mg total) by mouth 2 (two)  times daily with a meal. 60 tablet 11  . fenofibrate 160 MG tablet Take 1 tablet (160 mg total) by mouth daily. 30 tablet 11  . glipiZIDE (GLUCOTROL XL) 5 MG 24 hr tablet Take 1 tablet (5 mg total) by mouth daily with breakfast. 30 tablet 11  . metFORMIN (GLUCOPHAGE) 1000 MG tablet Take 500 mg by mouth 2 (two) times daily with a meal.     . nitroGLYCERIN (NITROSTAT) 0.4 MG SL tablet Place 1 tablet (0.4 mg total) under the tongue every 5 (five) minutes x 3 doses as needed for chest pain. 25 tablet 12  . prasugrel (EFFIENT) 10 MG TABS tablet Take 1 tablet (10 mg total) by mouth daily. 30 tablet 11   No current facility-administered medications  for this visit.    No Known Allergies  Review of Systems  Constitutional: Positive for fatigue.  HENT: Negative.   Eyes: Negative.   Respiratory: Positive for chest tightness and shortness of breath.   Cardiovascular: Positive for chest pain. Negative for palpitations and leg swelling.  Gastrointestinal: Negative.   Endocrine: Negative.   Genitourinary: Negative.   Musculoskeletal: Negative.   Skin: Negative.   Allergic/Immunologic: Negative.   Neurological: Positive for dizziness and light-headedness. Negative for syncope.  Hematological: Negative.   Psychiatric/Behavioral: Negative.     BP 134/90 mmHg  Pulse 70  Resp 20  Ht 5' 7.5" (1.715 m)  Wt 185 lb (83.915 kg)  BMI 28.53 kg/m2  SpO2 98% Physical Exam  Constitutional: He is oriented to person, place, and time. He appears well-developed and well-nourished. No distress.  HENT:  Head: Normocephalic and atraumatic.  Mouth/Throat: Oropharynx is clear and moist.  Eyes: EOM are normal. Pupils are equal, round, and reactive to light.  Neck: Normal range of motion. Neck supple. No JVD present. No thyromegaly present.  Cardiovascular: Normal rate, regular rhythm and intact distal pulses.   Murmur heard. 3/6 systolic murmur along RSB. No diastolic murmur.  Pulmonary/Chest: Effort normal and breath sounds normal. No respiratory distress. He has no rales.  Abdominal: Soft. Bowel sounds are normal. He exhibits no distension and no mass. There is no tenderness.  Musculoskeletal: Normal range of motion. He exhibits no edema or tenderness.  Lymphadenopathy:    He has no cervical adenopathy.  Neurological: He is alert and oriented to person, place, and time. He has normal strength. No cranial nerve deficit or sensory deficit.  Skin: Skin is warm and dry.  Psychiatric: He has a normal mood and affect.     Diagnostic Tests:            [facility]            [address]             [place], [place] [address]              [phone]  ------------------------------------------------------------------- Echocardiography  Patient:  Dennis Bates, Dennis Bates MR #:    [id number] Study Date: 11/12/2014 Gender:   M Age:    54 Height:   170.2 cm Weight:   83 kg BSA:    2 m^2 Pt. Status: Room:  ATTENDING  Donato Schultz, M.D. PERFORMING [facility], Outpatient ORDERING  Janetta Hora REFERRING  Cline Crock R  cc:  ------------------------------------------------------------------- LV EF: 60% -  65%  ------------------------------------------------------------------- Indications:   (I35.0).  ------------------------------------------------------------------- History:  PMH: Acquired from the patient and from the patient&'s chart. Dyspnea. Coronary artery disease. Aortic stenosis. PMH: Myocardial infarction. Risk factors: Hypertension.  Diabetes mellitus. Dyslipidemia.  ------------------------------------------------------------------- Study Conclusions  - Left ventricle: The cavity size was normal. There was mild focal basal hypertrophy of the septum. Systolic function was normal. The estimated ejection fraction was in the range of 60% to 65%. Wall motion was normal; there were no regional wall motion abnormalities. - Aortic valve: A bicuspid morphology cannot be excluded; severely thickened, severely calcified leaflets. Cusp separation was reduced. There was severe stenosis. Peak velocity (S): 421 cm/s. Valve area (Vmax): 0.79 cm^2. - Aorta: Ascending aortic diameter: 46 mm (S).  Impressions:  - When compared to prior, Aortic stenosis is now severe.  Echocardiography. M-mode, limited 2D, limited spectral Doppler, and color Doppler. Birthdate: Patient birthdate: [birth year]. Age: Patient is 55 yr old. Sex: Gender: male.  BMI: 28.7 kg/m^2. Blood pressure:   110/70 Patient status:  Outpatient. Study date: Study date: 11/12/2014. Study time: 07:58 AM. Location: [facility]  -------------------------------------------------------------------  ------------------------------------------------------------------- Left ventricle: The cavity size was normal. There was mild focal basal hypertrophy of the septum. Systolic function was normal. The estimated ejection fraction was in the range of 60% to 65%. Wall motion was normal; there were no regional wall motion abnormalities.  ------------------------------------------------------------------- Aortic valve:  A bicuspid morphology cannot be excluded; severely thickened, severely calcified leaflets. Cusp separation was reduced. Doppler:  There was severe stenosis.  There was no regurgitation.  VTI ratio of LVOT to aortic valve: 0.18. Valve area (VTI): 0.67 cm^2. Indexed valve area (VTI): 0.33 cm^2/m^2. Peak velocity ratio of LVOT to aortic valve: 0.21. Valve area (Vmax): 0.79 cm^2. Indexed valve area (Vmax): 0.39 cm^2/m^2. Mean velocity ratio of LVOT to aortic valve: 0.2. Valve area (Vmean): 0.75 cm^2. Indexed valve area (Vmean): 0.37 cm^2/m^2.  Mean gradient (S): 41 mm Hg. Peak gradient (S): 71 mm Hg.  ------------------------------------------------------------------- Aorta: Aortic root: The aortic root was normal in size. Ascending aorta: The ascending aorta was mildly dilated.  ------------------------------------------------------------------- Mitral valve:  Structurally normal valve.  Mobility was not restricted. Doppler: Transvalvular velocity was within the normal range. There was no evidence for stenosis. There was no regurgitation.  ------------------------------------------------------------------- Left atrium: The atrium was normal in size.  ------------------------------------------------------------------- Right ventricle: The cavity size was normal. Wall thickness  was normal. Systolic function was normal.  ------------------------------------------------------------------- Pulmonic valve:  Structurally normal valve.  Cusp separation was normal. Doppler: Transvalvular velocity was within the normal range. There was no evidence for stenosis. There was trivial regurgitation.  ------------------------------------------------------------------- Tricuspid valve:  Structurally normal valve.  Doppler: Transvalvular velocity was within the normal range. There was no regurgitation.  ------------------------------------------------------------------- Pulmonary artery:  The main pulmonary artery was normal-sized. Systolic pressure was within the normal range.  ------------------------------------------------------------------- Right atrium: The atrium was normal in size.  ------------------------------------------------------------------- Pericardium: There was no pericardial effusion.  ------------------------------------------------------------------- Systemic veins: Inferior vena cava: The vessel was normal in size.  ------------------------------------------------------------------- Measurements  Left ventricle              Value     Reference LV ID, ED, PLAX chordal          44.4 mm    43 - 52 LV ID, ES, PLAX chordal          31.1 mm    23 - 38 LV fx shortening, PLAX chordal      30  %    >=29 LV PW thickness, ED            11  mm    --------- IVS/LV PW ratio, ED        (  H)   1.33      <=1.3 Stroke volume, 2D             68  ml    --------- Stroke volume/bsa, 2D           34  ml/m^2  ---------  Ventricular septum            Value     Reference IVS thickness, ED             14.6 mm    ---------  LVOT                   Value     Reference LVOT  ID, S                22  mm    --------- LVOT area                 3.8  cm^2   --------- LVOT peak velocity, S           87.2 cm/s   --------- LVOT mean velocity, S           58.2 cm/s   --------- LVOT VTI, S                17.9 cm    ---------  Aortic valve               Value     Reference Aortic valve peak velocity, S       421  cm/s   --------- Aortic valve mean velocity, S       296  cm/s   --------- Aortic valve VTI, S            101  cm    --------- Aortic mean gradient, S          41  mm Hg  --------- Aortic peak gradient, S          71  mm Hg  --------- VTI ratio, LVOT/AV            0.18      --------- Aortic valve area, VTI          0.67 cm^2   --------- Aortic valve area/bsa, VTI        0.33 cm^2/m^2 --------- Velocity ratio, peak, LVOT/AV       0.21      --------- Aortic valve area, peak velocity     0.79 cm^2   --------- Aortic valve area/bsa, peak        0.39 cm^2/m^2 --------- velocity Velocity ratio, mean, LVOT/AV       0.2      --------- Aortic valve area, mean velocity     0.75 cm^2   --------- Aortic valve area/bsa, mean        0.37 cm^2/m^2 --------- velocity  Aorta                   Value     Reference Aortic root ID, ED            37  mm    --------- Ascending aorta ID, A-P, S        46  mm    ---------  Left atrium                Value     Reference LA ID, A-P, ES              37  mm    --------- LA ID/bsa, A-P              1.85 cm/m^2  <=2.2  Legend: (L) and (H) mark values outside specified reference  range.  ------------------------------------------------------------------- Prepared and Electronically Authenticated byDonato Schultzkains, M.D. 2016-03-01T09:44:08   Impression:  He has a bicuspid aortic valve with stage D severe symptomatic aortic stenosis and had PCI of a 99% OM2 lesion presenting with STEMI in Jan 2016. He does have some enlargement of the ascending aorta on cath. I think he should have AVR to prevent worsening symptoms of severe aortic stenosis or LV deterioration. He will need to have his aorta evaluated with CTA to decide if it needs to be replaced due to the 10% incidence of aortic aneurysm disease associated with bicuspid aortic valve disease. The current recommendation is replacement if it is 4.5 cm or greater, which his is by echo. I think he should also have another cath to reevaluate his coronary disease before heart surgery. He does have some chest discomfort that is a little vague but could be due to restenosis. I discussed this with him and his wife. 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 I think he should just wait until then to do the CTA , cath and surgery as long as his symptoms are stable.  Plan:  He will return to see me as soon as he gets insurance so that we can schedule CTA of the chest, cardiac cath, and surgery.  Alleen Borne, MD [facility] [phone]

## 2015-07-16 ENCOUNTER — Ambulatory Visit: Payer: Self-pay | Admitting: Surgery

## 2015-08-22 ENCOUNTER — Other Ambulatory Visit: Payer: Self-pay | Admitting: Surgery

## 2015-08-26 ENCOUNTER — Other Ambulatory Visit: Payer: Self-pay | Admitting: Surgery

## 2015-08-26 DIAGNOSIS — I350 Nonrheumatic aortic (valve) stenosis: Secondary | ICD-10-CM

## 2015-09-02 ENCOUNTER — Telehealth: Payer: Self-pay | Admitting: *Deleted

## 2015-09-02 NOTE — Telephone Encounter (Signed)
-----   Message from Marykay Lexavid W Harding, MD sent at 09/01/2015  3:59 PM EST ----- Sure - is this for AVR?  Does he need R&LHC?  Will forward to my RN - Jasmine DecemberSharon  -- he will need an office visit (can be with PA) for a pre-cath H&P.  Marykay LexHARDING, DAVID W, MD  ----- Message -----    From: Davina Pokeyan C Brooks, RN    Sent: 08/26/2015  12:15 PM      To: Marykay Lexavid W Harding, MD  Hey!!  This patient is going to see Dr. [name] 1/11 with a same day CTA chest.  He is wondering if you can cath him anytime b/t 1/3-1/10?  Everything is in Jan b/c of insurance.  Let me know and I will update Dr. [name].  Thanks so much,  Fiservyan

## 2015-09-02 NOTE — Telephone Encounter (Signed)
Sent to scheduler to set appointment with extender  Needs work up prior to cardiac cath

## 2015-09-14 HISTORY — PX: RIGHT/LEFT HEART CATH AND CORONARY ANGIOGRAPHY: CATH118266

## 2015-09-16 ENCOUNTER — Ambulatory Visit (INDEPENDENT_AMBULATORY_CARE_PROVIDER_SITE_OTHER): Payer: BC Managed Care – PPO | Admitting: Physician Assistant

## 2015-09-16 ENCOUNTER — Encounter: Payer: Self-pay | Admitting: Cardiology

## 2015-09-16 ENCOUNTER — Encounter: Payer: Self-pay | Admitting: Physician Assistant

## 2015-09-16 VITALS — BP 124/84 | HR 95 | Ht 68.0 in | Wt 196.0 lb

## 2015-09-16 DIAGNOSIS — R0602 Shortness of breath: Secondary | ICD-10-CM

## 2015-09-16 DIAGNOSIS — I350 Nonrheumatic aortic (valve) stenosis: Secondary | ICD-10-CM | POA: Diagnosis not present

## 2015-09-16 DIAGNOSIS — D689 Coagulation defect, unspecified: Secondary | ICD-10-CM

## 2015-09-16 DIAGNOSIS — I2510 Atherosclerotic heart disease of native coronary artery without angina pectoris: Secondary | ICD-10-CM

## 2015-09-16 DIAGNOSIS — Z79899 Other long term (current) drug therapy: Secondary | ICD-10-CM

## 2015-09-16 LAB — CBC
HCT: 40 % (ref 39.0–52.0)
Hemoglobin: 14.5 g/dL (ref 13.0–17.0)
MCH: 30.5 pg (ref 26.0–34.0)
MCHC: 36.3 g/dL — ABNORMAL HIGH (ref 30.0–36.0)
MCV: 84 fL (ref 78.0–100.0)
MPV: 10.7 fL (ref 8.6–12.4)
Platelets: 292 10*3/uL (ref 150–400)
RBC: 4.76 MIL/uL (ref 4.22–5.81)
RDW: 13.9 % (ref 11.5–15.5)
WBC: 7.1 10*3/uL (ref 4.0–10.5)

## 2015-09-16 NOTE — H&P (Signed)
Cardiology History and Physical   Date: 09/16/2015   ID: Dennis Conversehomas Mcever, DOB [birth year], MRN [id number]  PCP: Leanor RubensteinSUN,VYVYAN Y, MD Cardiologist: Dr. Lauraine RinneHarding  Barrett, Rhonda, PA-C   Chief Complaint  Patient presents with  . Follow-up    pt need to have Catherization, need prior work up, pt denied chest pain  . Shortness of Breath    History of Present Illness: Dennis Bates is a 55 y.o. male with a history of type 2 DM, hyperlipidemia, CAD s/p inferior STEMI and PCI/DES to a 99% OM2 09/2014, known bicuspid aortic valve stenosis. Echo on 11/2014 showed severe ES, peak velocity 421 cm/s, with an ascending aorta 4.6 cm, EF 60-65 percent, mean AV gradient 41 mmHg. Seen by Dr. [name] 03/2015, he was to get a CT angiogram, cardiac catheterization and then be considered for aortic valve replacement but he wished to wait until he got insurance. He is here today for preoperative evaluation.  Dennis Bates has been struggling. Despite significantly limiting his activity, he feels that he continues to go downhill. He can only walk for about 10 minutes on flat ground without having to stop due to shortness of breath. He has not been able to increase that. He cannot exert himself much above walking on flat ground without significant shortness of breath. He tires easily. He has had no chest pain, no presyncope and no syncope. His activity is very limited now to try and avoid symptoms and that generally succeeds but he is frustrated with this. He is looking forward to the surgery in order to improve.   Past Medical History  Diagnosis Date  . Hyperlipidemia   . Diabetes mellitus type 2, controlled, with complications   . Bell's palsy   . Aortic stenosis due to bicuspid aortic valve     Probable bicuspid aortic valve: a. mod-sev by 2D ECHO 09/2014. AVA 0.9cm^2; b) 10/2014 Echo:. Severe AS Mn-Pk Gradient 41-71 mmHg, AVA ~0.79 cm2, EF 60-65%  .  Hypertriglyceridemia   . CAD S/P percutaneous coronary angioplasty 09/20/2014    a. inferolat STEMI ->> LHC-Angio: Proximal/ostial LAD 20%, OM2 99% -->> PCI: 3mm x 16 mm Promus Premier DES to the OM2.(~3.5 mm)  . ST elevation myocardial infarction (STEMI) of inferior wall 09/20/2014    Past Surgical History  Procedure Laterality Date  . Cardiac catheterization      remote h/o cath >15 yrs ago, unkown  . Doppler echocardiography  January 2016, February2016    Probable bicuspid aortic valve: a. mod-sev by 2D ECHO 09/2014. AVA 0.9cm^2; b) 10/2014 Echo:. Severe AS Mn-Pk Gradient 41-71 mmHg, AVA ~0.79 cm2, EF 60-65%  . Left heart catheterization with coronary angiogram N/A 09/20/2014    Procedure: LEFT HEART CATHETERIZATION WITH CORONARY ANGIOGRAM; Surgeon: Marykay Lexavid W Harding, MD; [facility]; Service: Cardiovascular; Laterality: N/A;  . Percutaneous coronary stent intervention (pci-s)  09/20/2014    PTCI to OM 2 with Promus Premier DES 3.0 mm x 16 mm (3.5 mm)    Current Outpatient Prescriptions  Medication Sig Dispense Refill  . aspirin EC 81 MG EC tablet Take 1 tablet (81 mg total) by mouth daily.    Marland Kitchen. atorvastatin (LIPITOR) 80 MG tablet Take 1 tablet (80 mg total) by mouth daily. (Patient taking differently: Take 40 mg by mouth daily. ) 30 tablet 11  . carvedilol (COREG) 3.125 MG tablet Take 1 tablet (3.125 mg total) by mouth 2 (two) times daily with a meal. 60 tablet 11  . fenofibrate 160 MG tablet Take  1 tablet (160 mg total) by mouth daily. 30 tablet 11  . glipiZIDE (GLUCOTROL XL) 5 MG 24 hr tablet Take 1 tablet (5 mg total) by mouth daily with breakfast. 30 tablet 11  . metFORMIN (GLUCOPHAGE) 1000 MG tablet Take 500 mg by mouth 2 (two) times daily with a meal.     . nitroGLYCERIN (NITROSTAT) 0.4 MG SL tablet Place 1 tablet (0.4 mg total) under the tongue every 5 (five) minutes x 3 doses as needed for  chest pain. 25 tablet 12  . prasugrel (EFFIENT) 10 MG TABS tablet Take 1 tablet (10 mg total) by mouth daily. 30 tablet 11   No current facility-administered medications for this visit.    Allergies: Review of patient's allergies indicates no known allergies.    Social History: The patient  reports that he has never smoked. He does not have any smokeless tobacco history on file. He reports that he does not drink alcohol or use illicit drugs.   Family History: The patient's family history is negative for CAD.    ROS: Please see the history of present illness. All other systems are reviewed and negative.    PHYSICAL EXAM: VS: BP 124/84 mmHg  Pulse 95  Ht 5\' 8"  (1.727 m)  Wt 196 lb (88.905 kg)  BMI 29.81 kg/m2 , BMI Body mass index is 29.81 kg/(m^2). GEN: Well nourished, well developed, male in no acute distress  HEENT: normal for age  Neck: no JVD, no carotid bruits but his murmur radiates up into his carotids, no masses Cardiac: RRR; 3/6 murmur, S1 is noted, no distinct S2, no rubs, or gallops Respiratory: clear to auscultation bilaterally, normal work of breathing GI: soft, nontender, nondistended, + BS MS: no deformity or atrophy; no edema; distal pulses are 2+ in all 4 extremities  Skin: warm and dry, no rash Neuro: Strength and sensation are intact Psych: euthymic mood, full affect   EKG: EKG is ordered today. The ekg ordered today demonstrates Sinus rhythm, rate 95, no acute ischemic changes   Recent Labs: 09/20/2014: TSH 3.113 09/21/2014: Hemoglobin 13.8; Platelets 259 09/23/2014: BUN 11; Creatinine, Ser 0.82; Potassium 3.8; Sodium 137 11/05/2014: ALT 21    Lipid Panel  Labs (Brief)       Component Value Date/Time   CHOL 107 11/05/2014 0841   TRIG 182.0* 11/05/2014 0841   HDL 28.20* 11/05/2014 0841   CHOLHDL 4 11/05/2014 0841   VLDL 36.4 11/05/2014 0841   LDLCALC 42 11/05/2014 0841   LDLDIRECT 43 09/21/2014  1108      Wt Readings from Last 3 Encounters:  09/16/15 196 lb (88.905 kg)  04/02/15 185 lb (83.915 kg)  11/19/14 189 lb 11.2 oz (86.047 kg)     Other studies Reviewed: Additional studies/ records that were reviewed today include: Hospital records, office notes and Dr. Sharee Pimple consult note.  ASSESSMENT AND PLAN:  1. Severe aortic stenosis: he would benefit from surgery. He has been evaluated by Dr. Laneta Simmers and open heart surgery is the most appropriate procedure. He has an appointment with Dr. Laneta Simmers on January 11 and will get a CT angiogram at that time.  Prior to the CT, we will schedule a right and left cardiac catheterization to evaluate his anatomy and pressures. This is scheduled with Dr. Tresa Endo for 01/05. He is currently stable for the procedure. Risks and benefits were reviewed and the patient wishes to proceed.  2. CAD: He is not having any ischemic symptoms. He is on aspirin, statin, beta blocker and  Effient.   Current medicines are reviewed at length with the patient today. The patient does not have concerns regarding medicines.  The following changes have been made: no change  Labs/ tests ordered today include:  precath orders   Disposition: FU with Dr. [name]  Signed, Leanna Battles  09/16/2015 2:42 PM  [facility] [address] [place], [place], [place] [address] Phone: [phone]; Fax: [phone]

## 2015-09-16 NOTE — Patient Instructions (Signed)
Your physician has requested that you have aRIGHT AND LEFT HEART  cardiac catheterization THIS WEEK. Cardiac catheterization is used to diagnose and/or treat various heart conditions. Doctors may recommend this procedure for a number of different reasons. The most common reason is to evaluate chest pain. Chest pain can be a symptom of coronary artery disease (CAD), and cardiac catheterization can show whether plaque is narrowing or blocking your heart's arteries. This procedure is also used to evaluate the valves, as well as measure the blood flow and oxygen levels in different parts of your heart. For further information please visit [email or web address] Please follow instruction sheet, as given.  Your physician recommends that you return for lab work in: TODAY AT [facility] FIRST FLOOR.

## 2015-09-16 NOTE — Progress Notes (Addendum)
  Cardiology Office Note   Date:  09/16/2015   ID:  Dennis Bates, DOB 07/10/1961, MRN [id number]  PCP:  SUN,VYVYAN Y, MD  Cardiologist:  Dr. Harding  Barrett, Rhonda, PA-C   Chief Complaint  Patient presents with  . Follow-up    pt need to have Catherization, need prior work up, pt denied chest pain  . Shortness of Breath    History of Present Illness: Dennis Bates is a 54 y.o. male with a history of type 2 DM, hyperlipidemia, CAD s/p inferior STEMI and PCI/DES to a 99% OM2 09/2014, known bicuspid aortic valve stenosis. Echo on 11/2014 showed severe ES, peak velocity 421 cm/s, with an ascending aorta 4.6 cm, EF 60-65 percent, mean AV gradient 41 mmHg. Seen by Dr. Bartle 03/2015, he was to get a CT angiogram, cardiac catheterization and then be considered for aortic valve replacement but he wished to wait until he got insurance. He is here today for preoperative evaluation.  Dennis Bates has been struggling. Despite significantly limiting his activity, he feels that he continues to go downhill. He can only walk for about 10 minutes on flat ground without having to stop due to shortness of breath. He has not been able to increase that. He cannot exert himself much above walking on flat ground without significant shortness of breath. He tires easily. He has had no chest pain, no presyncope and no syncope. His activity is very limited now to try and avoid symptoms and that generally succeeds but he is frustrated with this. He is looking forward to the surgery in order to improve.   Past Medical History  Diagnosis Date  . Hyperlipidemia   . Diabetes mellitus type 2, controlled, with complications   . Bell's palsy   . Aortic stenosis due to bicuspid aortic valve     Probable bicuspid aortic valve: a. mod-sev by 2D ECHO 09/2014. AVA 0.9cm^2; b) 10/2014 Echo:. Severe AS Mn-Pk Gradient 41-71 mmHg, AVA ~0.79 cm2, EF 60-65%  . Hypertriglyceridemia   . CAD S/P percutaneous coronary angioplasty  09/20/2014    a. inferolat STEMI ->> LHC-Angio: Proximal/ostial LAD 20%, OM2 99% -->> PCI: 3mm x 16 mm Promus Premier DES to the OM2.(~3.5 mm)  . ST elevation myocardial infarction (STEMI) of inferior wall 09/20/2014    Past Surgical History  Procedure Laterality Date  . Cardiac catheterization      remote h/o cath >15 yrs ago, unkown  . Doppler echocardiography  January 2016, February2016    Probable bicuspid aortic valve: a. mod-sev by 2D ECHO 09/2014. AVA 0.9cm^2; b) 10/2014 Echo:. Severe AS Mn-Pk Gradient 41-71 mmHg, AVA ~0.79 cm2, EF 60-65%  . Left heart catheterization with coronary angiogram N/A 09/20/2014    Procedure: LEFT HEART CATHETERIZATION WITH CORONARY ANGIOGRAM;  Surgeon: David W Harding, MD;  Location: [facility];  Service: Cardiovascular;  Laterality: N/A;  . Percutaneous coronary stent intervention (pci-s)  09/20/2014    PTCI to OM 2 with Promus Premier DES 3.0 mm x 16 mm (3.5 mm)    Current Outpatient Prescriptions  Medication Sig Dispense Refill  . aspirin EC 81 MG EC tablet Take 1 tablet (81 mg total) by mouth daily.    . atorvastatin (LIPITOR) 80 MG tablet Take 1 tablet (80 mg total) by mouth daily. (Patient taking differently: Take 40 mg by mouth daily. ) 30 tablet 11  . carvedilol (COREG) 3.125 MG tablet Take 1 tablet (3.125 mg total) by mouth 2 (two) times daily with a meal. 60   tablet 11  . fenofibrate 160 MG tablet Take 1 tablet (160 mg total) by mouth daily. 30 tablet 11  . glipiZIDE (GLUCOTROL XL) 5 MG 24 hr tablet Take 1 tablet (5 mg total) by mouth daily with breakfast. 30 tablet 11  . metFORMIN (GLUCOPHAGE) 1000 MG tablet Take 500 mg by mouth 2 (two) times daily with a meal.     . nitroGLYCERIN (NITROSTAT) 0.4 MG SL tablet Place 1 tablet (0.4 mg total) under the tongue every 5 (five) minutes x 3 doses as needed for chest pain. 25 tablet 12  . prasugrel (EFFIENT) 10 MG TABS tablet Take 1 tablet (10 mg total) by mouth daily. 30 tablet 11   No current  facility-administered medications for this visit.    Allergies:   Review of patient's allergies indicates no known allergies.    Social History:  The patient  reports that he has never smoked. He does not have any smokeless tobacco history on file. He reports that he does not drink alcohol or use illicit drugs.   Family History:  The patient's family history is negative for CAD.    ROS:  Please see the history of present illness. All other systems are reviewed and negative.    PHYSICAL EXAM: VS:  BP 124/84 mmHg  Pulse 95  Ht 5' 8" (1.727 m)  Wt 196 lb (88.905 kg)  BMI 29.81 kg/m2 , BMI Body mass index is 29.81 kg/(m^2). GEN: Well nourished, well developed, male in no acute distress HEENT: normal for age  Neck: no JVD, no carotid bruits but his murmur radiates up into his carotids, no masses Cardiac: RRR; 3/6 murmur, S1 is noted, no distinct S2, no rubs, or gallops Respiratory:  clear to auscultation bilaterally, normal work of breathing GI: soft, nontender, nondistended, + BS MS: no deformity or atrophy; no edema; distal pulses are 2+ in all 4 extremities  Skin: warm and dry, no rash Neuro:  Strength and sensation are intact Psych: euthymic mood, full affect   EKG:  EKG is ordered today. The ekg ordered today demonstrates  Sinus rhythm, rate 95, no acute ischemic changes   Recent Labs: 09/20/2014: TSH 3.113 09/21/2014: Hemoglobin 13.8; Platelets 259 09/23/2014: BUN 11; Creatinine, Ser 0.82; Potassium 3.8; Sodium 137 11/05/2014: ALT 21    Lipid Panel    Component Value Date/Time   CHOL 107 11/05/2014 0841   TRIG 182.0* 11/05/2014 0841   HDL 28.20* 11/05/2014 0841   CHOLHDL 4 11/05/2014 0841   VLDL 36.4 11/05/2014 0841   LDLCALC 42 11/05/2014 0841   LDLDIRECT 43 09/21/2014 1108     Wt Readings from Last 3 Encounters:  09/16/15 196 lb (88.905 kg)  04/02/15 185 lb (83.915 kg)  11/19/14 189 lb 11.2 oz (86.047 kg)     Other studies Reviewed: Additional studies/  records that were reviewed today include:  Hospital records, office notes and Dr. Bartle's consult note.  ASSESSMENT AND PLAN:  1.   Severe aortic stenosis: he would benefit from surgery. He has been evaluated by Dr. Bartle in open heart surgeries and most appropriate procedure. He has an appointment with Dr. Bartle on January 11 and will get a CT angiogram at that time.  Prior to the CT, we will schedule a right and left cardiac catheterization to evaluate his anatomy and pressures. This is scheduled with Dr. Kelly for 01/05. He is currently stable for the procedure. Risks and benefits were reviewed and the patient wishes to proceed.   2. CAD: He   is not having any ischemic symptoms. He is on aspirin, statin, beta blocker and Effient.   Current medicines are reviewed at length with the patient today.  The patient does not have concerns regarding medicines.  The following changes have been made:  no change  Labs/ tests ordered today include:   precath orders   Disposition:   FU with  Dr. Harding  Signed, Barrett, Rhonda, PA-C  09/16/2015 2:42 PM    [facility] [address], [place], [place]  [address] Phone: [phone]; Fax: [phone]     

## 2015-09-17 LAB — BASIC METABOLIC PANEL
BUN: 11 mg/dL (ref 7–25)
CO2: 26 mmol/L (ref 20–31)
Calcium: 9.8 mg/dL (ref 8.6–10.3)
Chloride: 99 mmol/L (ref 98–110)
Creat: 0.75 mg/dL (ref 0.70–1.33)
Glucose, Bld: 244 mg/dL — ABNORMAL HIGH (ref 65–99)
Potassium: 4.2 mmol/L (ref 3.5–5.3)
Sodium: 136 mmol/L (ref 135–146)

## 2015-09-17 LAB — APTT: aPTT: 28 seconds (ref 24–37)

## 2015-09-17 LAB — PROTIME-INR
INR: 0.91 (ref <1.50)
Prothrombin Time: 12.3 seconds (ref 11.6–15.2)

## 2015-09-17 NOTE — Progress Notes (Signed)
Thanks for seeing him! 

## 2015-09-18 ENCOUNTER — Ambulatory Visit (HOSPITAL_COMMUNITY)
Admission: RE | Admit: 2015-09-18 | Discharge: 2015-09-18 | Disposition: A | Discharge status: Home / Self Care | Payer: BC Managed Care – PPO | Source: Ambulatory Visit | Attending: Cardiovascular Disease | Admitting: Cardiovascular Disease

## 2015-09-18 ENCOUNTER — Encounter (HOSPITAL_COMMUNITY)
Admission: RE | Disposition: A | Discharge status: Home / Self Care | Payer: Self-pay | Source: Ambulatory Visit | Attending: Cardiovascular Disease

## 2015-09-18 DIAGNOSIS — Z7982 Long term (current) use of aspirin: Secondary | ICD-10-CM | POA: Insufficient documentation

## 2015-09-18 DIAGNOSIS — Z955 Presence of coronary angioplasty implant and graft: Secondary | ICD-10-CM | POA: Diagnosis not present

## 2015-09-18 DIAGNOSIS — Z9861 Coronary angioplasty status: Secondary | ICD-10-CM

## 2015-09-18 DIAGNOSIS — G510 Bell's palsy: Secondary | ICD-10-CM | POA: Insufficient documentation

## 2015-09-18 DIAGNOSIS — E119 Type 2 diabetes mellitus without complications: Secondary | ICD-10-CM | POA: Diagnosis present

## 2015-09-18 DIAGNOSIS — Z7984 Long term (current) use of oral hypoglycemic drugs: Secondary | ICD-10-CM | POA: Insufficient documentation

## 2015-09-18 DIAGNOSIS — I2510 Atherosclerotic heart disease of native coronary artery without angina pectoris: Secondary | ICD-10-CM

## 2015-09-18 DIAGNOSIS — I77810 Thoracic aortic ectasia: Secondary | ICD-10-CM | POA: Diagnosis not present

## 2015-09-18 DIAGNOSIS — E785 Hyperlipidemia, unspecified: Secondary | ICD-10-CM | POA: Insufficient documentation

## 2015-09-18 DIAGNOSIS — I10 Essential (primary) hypertension: Secondary | ICD-10-CM | POA: Insufficient documentation

## 2015-09-18 DIAGNOSIS — E781 Pure hyperglyceridemia: Secondary | ICD-10-CM | POA: Diagnosis not present

## 2015-09-18 DIAGNOSIS — Q231 Congenital insufficiency of aortic valve: Secondary | ICD-10-CM

## 2015-09-18 DIAGNOSIS — I350 Nonrheumatic aortic (valve) stenosis: Secondary | ICD-10-CM | POA: Diagnosis not present

## 2015-09-18 DIAGNOSIS — I252 Old myocardial infarction: Secondary | ICD-10-CM | POA: Diagnosis not present

## 2015-09-18 DIAGNOSIS — Q230 Congenital stenosis of aortic valve: Secondary | ICD-10-CM

## 2015-09-18 LAB — POCT I-STAT 3, ART BLOOD GAS (G3+)
Acid-base deficit: 2 mmol/L (ref 0.0–2.0)
Bicarbonate: 23.9 mEq/L (ref 20.0–24.0)
O2 Saturation: 94 %
TCO2: 25 mmol/L (ref 0–100)
pCO2 arterial: 42.8 mmHg (ref 35.0–45.0)
pH, Arterial: 7.356 (ref 7.350–7.450)
pO2, Arterial: 75 mmHg — ABNORMAL LOW (ref 80.0–100.0)

## 2015-09-18 LAB — POCT I-STAT 3, VENOUS BLOOD GAS (G3P V)
Acid-Base Excess: 2 mmol/L (ref 0.0–2.0)
Bicarbonate: 27.5 mEq/L — ABNORMAL HIGH (ref 20.0–24.0)
O2 Saturation: 70 %
TCO2: 29 mmol/L (ref 0–100)
pCO2, Ven: 48.2 mmHg (ref 45.0–50.0)
pH, Ven: 7.365 — ABNORMAL HIGH (ref 7.250–7.300)
pO2, Ven: 38 mmHg (ref 30.0–45.0)

## 2015-09-18 LAB — GLUCOSE, CAPILLARY
Glucose-Capillary: 171 mg/dL — ABNORMAL HIGH (ref 65–99)
Glucose-Capillary: 139 mg/dL — ABNORMAL HIGH (ref 65–99)

## 2015-09-18 SURGERY — RIGHT HEART CATH AND CORONARY ANGIOGRAPHY

## 2015-09-18 MED ORDER — SODIUM CHLORIDE 0.9 % IJ SOLN: 3.0000 mL | Freq: Two times a day (BID) | INTRAMUSCULAR | Status: DC

## 2015-09-18 MED ORDER — ACETAMINOPHEN 325 MG PO TABS: 650.0000 mg | ORAL_TABLET | ORAL | Status: DC | PRN

## 2015-09-18 MED ORDER — SODIUM CHLORIDE 0.9 % IV SOLN: INTRAVENOUS | Status: DC

## 2015-09-18 MED ORDER — ASPIRIN 81 MG PO CHEW: 81.0000 mg | CHEWABLE_TABLET | ORAL | Status: DC

## 2015-09-18 MED ORDER — SODIUM CHLORIDE 0.9 % WEIGHT BASED INFUSION
3.0000 mL/kg/h | INTRAVENOUS | Status: AC
  Administered 2015-09-18: 3 mL/kg/h via INTRAVENOUS

## 2015-09-18 MED ORDER — LIDOCAINE HCL (PF) 1 % IJ SOLN
INTRAMUSCULAR | Status: AC
  Filled 2015-09-18: qty 30

## 2015-09-18 MED ORDER — LIDOCAINE HCL (PF) 1 % IJ SOLN
INTRAMUSCULAR | Status: DC | PRN
  Administered 2015-09-18: 25 mL via SUBCUTANEOUS

## 2015-09-18 MED ORDER — ONDANSETRON HCL 4 MG/2ML IJ SOLN: 4.0000 mg | Freq: Four times a day (QID) | INTRAMUSCULAR | Status: DC | PRN

## 2015-09-18 MED ORDER — SODIUM CHLORIDE 0.9 % WEIGHT BASED INFUSION: 1.0000 mL/kg/h | INTRAVENOUS | Status: DC

## 2015-09-18 MED ORDER — SODIUM CHLORIDE 0.9 % IJ SOLN: 3.0000 mL | INTRAMUSCULAR | Status: DC | PRN

## 2015-09-18 MED ORDER — FENTANYL CITRATE (PF) 100 MCG/2ML IJ SOLN
INTRAMUSCULAR | Status: DC | PRN
  Administered 2015-09-18 (×2): 25 ug via INTRAVENOUS

## 2015-09-18 MED ORDER — HEPARIN (PORCINE) IN NACL 2-0.9 UNIT/ML-% IJ SOLN
INTRAMUSCULAR | Status: AC
  Filled 2015-09-18: qty 2000

## 2015-09-18 MED ORDER — DIAZEPAM 5 MG PO TABS: 5.0000 mg | ORAL_TABLET | Freq: Four times a day (QID) | ORAL | Status: DC | PRN

## 2015-09-18 MED ORDER — MIDAZOLAM HCL 2 MG/2ML IJ SOLN
INTRAMUSCULAR | Status: AC
  Filled 2015-09-18: qty 2

## 2015-09-18 MED ORDER — IOHEXOL 350 MG/ML SOLN
INTRAVENOUS | Status: DC | PRN
  Administered 2015-09-18: 90 mL via INTRA_ARTERIAL

## 2015-09-18 MED ORDER — FENTANYL CITRATE (PF) 100 MCG/2ML IJ SOLN
INTRAMUSCULAR | Status: AC
  Filled 2015-09-18: qty 2

## 2015-09-18 MED ORDER — SODIUM CHLORIDE 0.9 % IV SOLN: 250.0000 mL | INTRAVENOUS | Status: DC | PRN

## 2015-09-18 MED ORDER — HEPARIN (PORCINE) IN NACL 2-0.9 UNIT/ML-% IJ SOLN
INTRAMUSCULAR | Status: DC | PRN
  Administered 2015-09-18: 12:00:00

## 2015-09-18 MED ORDER — MIDAZOLAM HCL 2 MG/2ML IJ SOLN
INTRAMUSCULAR | Status: DC | PRN
  Administered 2015-09-18: 2 mg via INTRAVENOUS
  Administered 2015-09-18: 1 mg via INTRAVENOUS

## 2015-09-18 SURGICAL SUPPLY — 16 items
CATH FELDMAN FA1 6F 100CM (CATHETERS) ×2 IMPLANT
CATH INFINITI 5 FR JL6.0 (CATHETERS) ×2 IMPLANT
CATH INFINITI 5FR JL5 (CATHETERS) ×2 IMPLANT
CATH INFINITI 5FR MULTPACK ANG (CATHETERS) ×2 IMPLANT
CATH SITESEER 5F NTR (CATHETERS) ×2 IMPLANT
CATH SWAN GANZ 7F STRAIGHT (CATHETERS) ×2 IMPLANT
KIT HEART LEFT (KITS) ×2 IMPLANT
KIT HEART RIGHT NAMIC (KITS) ×2 IMPLANT
PACK CARDIAC CATHETERIZATION (CUSTOM PROCEDURE TRAY) ×2 IMPLANT
SHEATH PINNACLE 6F 10CM (SHEATH) ×2 IMPLANT
SHEATH PINNACLE 7F 10CM (SHEATH) ×2 IMPLANT
SYR MEDRAD MARK V 150ML (SYRINGE) ×2 IMPLANT
TRANSDUCER W/STOPCOCK (MISCELLANEOUS) ×4 IMPLANT
TUBING CIL FLEX 10 FLL-RA (TUBING) ×2 IMPLANT
WIRE EMERALD 3MM-J .035X150CM (WIRE) ×2 IMPLANT
WIRE EMERALD ST .035X260CM (WIRE) ×2 IMPLANT

## 2015-09-18 NOTE — H&P (View-Only) (Signed)
Cardiology Office Note   Date:  09/16/2015   ID:  Dennis Bates, DOB 10-31-1960, MRN [id number]  PCP:  Leanor RubensteinSUN,VYVYAN Y, MD  Cardiologist:  Dr. Lauraine RinneHarding  Ruie Sendejo, PA-C   Chief Complaint  Patient presents with  . Follow-up    pt need to have Catherization, need prior work up, pt denied chest pain  . Shortness of Breath    History of Present Illness: Dennis Bates is a 55 y.o. male with a history of type 2 DM, hyperlipidemia, CAD s/p inferior STEMI and PCI/DES to a 99% OM2 09/2014, known bicuspid aortic valve stenosis. Echo on 11/2014 showed severe ES, peak velocity 421 cm/s, with an ascending aorta 4.6 cm, EF 60-65 percent, mean AV gradient 41 mmHg. Seen by Dr. [name] 03/2015, he was to get a CT angiogram, cardiac catheterization and then be considered for aortic valve replacement but he wished to wait until he got insurance. He is here today for preoperative evaluation.  Dennis Bates has been struggling. Despite significantly limiting his activity, he feels that he continues to go downhill. He can only walk for about 10 minutes on flat ground without having to stop due to shortness of breath. He has not been able to increase that. He cannot exert himself much above walking on flat ground without significant shortness of breath. He tires easily. He has had no chest pain, no presyncope and no syncope. His activity is very limited now to try and avoid symptoms and that generally succeeds but he is frustrated with this. He is looking forward to the surgery in order to improve.   Past Medical History  Diagnosis Date  . Hyperlipidemia   . Diabetes mellitus type 2, controlled, with complications   . Bell's palsy   . Aortic stenosis due to bicuspid aortic valve     Probable bicuspid aortic valve: a. mod-sev by 2D ECHO 09/2014. AVA 0.9cm^2; b) 10/2014 Echo:. Severe AS Mn-Pk Gradient 41-71 mmHg, AVA ~0.79 cm2, EF 60-65%  . Hypertriglyceridemia   . CAD S/P percutaneous coronary angioplasty  09/20/2014    a. inferolat STEMI ->> LHC-Angio: Proximal/ostial LAD 20%, OM2 99% -->> PCI: 3mm x 16 mm Promus Premier DES to the OM2.(~3.5 mm)  . ST elevation myocardial infarction (STEMI) of inferior wall 09/20/2014    Past Surgical History  Procedure Laterality Date  . Cardiac catheterization      remote h/o cath >15 yrs ago, unkown  . Doppler echocardiography  January 2016, February2016    Probable bicuspid aortic valve: a. mod-sev by 2D ECHO 09/2014. AVA 0.9cm^2; b) 10/2014 Echo:. Severe AS Mn-Pk Gradient 41-71 mmHg, AVA ~0.79 cm2, EF 60-65%  . Left heart catheterization with coronary angiogram N/A 09/20/2014    Procedure: LEFT HEART CATHETERIZATION WITH CORONARY ANGIOGRAM;  Surgeon: Marykay Lexavid W Harding, MD;  Location: [facility];  Service: Cardiovascular;  Laterality: N/A;  . Percutaneous coronary stent intervention (pci-s)  09/20/2014    PTCI to OM 2 with Promus Premier DES 3.0 mm x 16 mm (3.5 mm)    Current Outpatient Prescriptions  Medication Sig Dispense Refill  . aspirin EC 81 MG EC tablet Take 1 tablet (81 mg total) by mouth daily.    Marland Kitchen. atorvastatin (LIPITOR) 80 MG tablet Take 1 tablet (80 mg total) by mouth daily. (Patient taking differently: Take 40 mg by mouth daily. ) 30 tablet 11  . carvedilol (COREG) 3.125 MG tablet Take 1 tablet (3.125 mg total) by mouth 2 (two) times daily with a meal. 60  tablet 11  . fenofibrate 160 MG tablet Take 1 tablet (160 mg total) by mouth daily. 30 tablet 11  . glipiZIDE (GLUCOTROL XL) 5 MG 24 hr tablet Take 1 tablet (5 mg total) by mouth daily with breakfast. 30 tablet 11  . metFORMIN (GLUCOPHAGE) 1000 MG tablet Take 500 mg by mouth 2 (two) times daily with a meal.     . nitroGLYCERIN (NITROSTAT) 0.4 MG SL tablet Place 1 tablet (0.4 mg total) under the tongue every 5 (five) minutes x 3 doses as needed for chest pain. 25 tablet 12  . prasugrel (EFFIENT) 10 MG TABS tablet Take 1 tablet (10 mg total) by mouth daily. 30 tablet 11   No current  facility-administered medications for this visit.    Allergies:   Review of patient's allergies indicates no known allergies.    Social History:  The patient  reports that he has never smoked. He does not have any smokeless tobacco history on file. He reports that he does not drink alcohol or use illicit drugs.   Family History:  The patient's family history is negative for CAD.    ROS:  Please see the history of present illness. All other systems are reviewed and negative.    PHYSICAL EXAM: VS:  BP 124/84 mmHg  Pulse 95  Ht 5\' 8"  (1.727 m)  Wt 196 lb (88.905 kg)  BMI 29.81 kg/m2 , BMI Body mass index is 29.81 kg/(m^2). GEN: Well nourished, well developed, male in no acute distress HEENT: normal for age  Neck: no JVD, no carotid bruits but his murmur radiates up into his carotids, no masses Cardiac: RRR; 3/6 murmur, S1 is noted, no distinct S2, no rubs, or gallops Respiratory:  clear to auscultation bilaterally, normal work of breathing GI: soft, nontender, nondistended, + BS MS: no deformity or atrophy; no edema; distal pulses are 2+ in all 4 extremities  Skin: warm and dry, no rash Neuro:  Strength and sensation are intact Psych: euthymic mood, full affect   EKG:  EKG is ordered today. The ekg ordered today demonstrates  Sinus rhythm, rate 95, no acute ischemic changes   Recent Labs: 09/20/2014: TSH 3.113 09/21/2014: Hemoglobin 13.8; Platelets 259 09/23/2014: BUN 11; Creatinine, Ser 0.82; Potassium 3.8; Sodium 137 11/05/2014: ALT 21    Lipid Panel    Component Value Date/Time   CHOL 107 11/05/2014 0841   TRIG 182.0* 11/05/2014 0841   HDL 28.20* 11/05/2014 0841   CHOLHDL 4 11/05/2014 0841   VLDL 36.4 11/05/2014 0841   LDLCALC 42 11/05/2014 0841   LDLDIRECT 43 09/21/2014 1108     Wt Readings from Last 3 Encounters:  09/16/15 196 lb (88.905 kg)  04/02/15 185 lb (83.915 kg)  11/19/14 189 lb 11.2 oz (86.047 kg)     Other studies Reviewed: Additional studies/  records that were reviewed today include:  Hospital records, office notes and Dr. Sharee Pimple consult note.  ASSESSMENT AND PLAN:  1.   Severe aortic stenosis: he would benefit from surgery. He has been evaluated by Dr. Laneta Simmers in open heart surgeries and most appropriate procedure. He has an appointment with Dr. Laneta Simmers on January 11 and will get a CT angiogram at that time.  Prior to the CT, we will schedule a right and left cardiac catheterization to evaluate his anatomy and pressures. This is scheduled with Dr. Tresa Endo for 01/05. He is currently stable for the procedure. Risks and benefits were reviewed and the patient wishes to proceed.   2. CAD: He  is not having any ischemic symptoms. He is on aspirin, statin, beta blocker and Effient.   Current medicines are reviewed at length with the patient today.  The patient does not have concerns regarding medicines.  The following changes have been made:  no change  Labs/ tests ordered today include:   precath orders   Disposition:   FU with  Dr. [name]  Signed, Leanna Battles  09/16/2015 2:42 PM    [facility] [address] [place], [place], [place]  [address] Phone: [phone]; Fax: [phone]

## 2015-09-18 NOTE — Interval H&P Note (Signed)
Cath Lab Visit (complete for each Cath Lab visit)  Clinical Evaluation Leading to the Procedure:   ACS: No.  Non-ACS:    Anginal Classification: CCS III  Anti-ischemic medical therapy: Minimal Therapy (1 class of medications)  Non-Invasive Test Results: No non-invasive testing performed  Prior CABG: No previous CABG      History and Physical Interval Note:  09/18/2015 11:19 AM  Dennis Bates  has presented today for surgery, with the diagnosis of shortness of breath  The various methods of treatment have been discussed with the patient and family. After consideration of risks, benefits and other options for treatment, the patient has consented to  Procedure(s): Right/Left Heart Cath and Coronary Angiography (N/A) as a surgical intervention .  The patient's history has been reviewed, patient examined, no change in status, stable for surgery.  I have reviewed the patient's chart and labs.  Questions were answered to the patient's satisfaction.     KELLY,Gustavo A

## 2015-09-18 NOTE — Discharge Instructions (Addendum)
° °  HOLD METFORMIN FOR 48 HOURS. MAY RESUME SUN. MORNING ° ° °Angiogram, Care After °Refer to this sheet in the next few weeks. These instructions provide you with information about caring for yourself after your procedure. Your health care provider may also give you more specific instructions. Your treatment has been planned according to current medical practices, but problems sometimes occur. Call your health care provider if you have any problems or questions after your procedure. °WHAT TO EXPECT AFTER THE PROCEDURE °After your procedure, it is typical to have the following: °· Bruising at the catheter insertion site that usually fades within 1-2 weeks. °· Blood collecting in the tissue (hematoma) that may be painful to the touch. It should usually decrease in size and tenderness within 1-2 weeks. °HOME CARE INSTRUCTIONS °· Take medicines only as directed by your health care provider. °· You may shower 24-48 hours after the procedure or as directed by your health care provider. Remove the bandage (dressing) and gently wash the site with plain soap and water. Pat the area dry with a clean towel. Do not rub the site, because this may cause bleeding. °· Do not take baths, swim, or use a hot tub until your health care provider approves. °· Check your insertion site every day for redness, swelling, or drainage. °· Do not apply powder or lotion to the site. °· Do not lift over 10 lb (4.5 kg) for 5 days after your procedure or as directed by your health care provider. °· Ask your health care provider when it is okay to: °¨ Return to work or school. °¨ Resume usual physical activities or sports. °¨ Resume sexual activity. °· Do not drive home if you are discharged the same day as the procedure. Have someone else drive you. °· You may drive 24 hours after the procedure unless otherwise instructed by your health care provider. °· Do not operate machinery or power tools for 24 hours after the procedure or as directed by  your health care provider. °· If your procedure was done as an outpatient procedure, which means that you went home the same day as your procedure, a responsible adult should be with you for the first 24 hours after you arrive home. °· Keep all follow-up visits as directed by your health care provider. This is important. °SEEK MEDICAL CARE IF: °· You have a fever. °· You have chills. °· You have increased bleeding from the catheter insertion site. Hold pressure on the site. °SEEK IMMEDIATE MEDICAL CARE IF: °· You have unusual pain at the catheter insertion site. °· You have redness, warmth, or swelling at the catheter insertion site. °· You have drainage (other than a small amount of blood on the dressing) from the catheter insertion site. °· The catheter insertion site is bleeding, and the bleeding does not stop after 30 minutes of holding steady pressure on the site. °· The area near or just beyond the catheter insertion site becomes pale, cool, tingly, or numb. °  °This information is not intended to replace advice given to you by your health care provider. Make sure you discuss any questions you have with your health care provider. °  °Document Released: 03/18/2005 Document Revised: 09/20/2014 Document Reviewed: 01/31/2013 °Elsevier Interactive Patient Education ©2016 [employer] ° °

## 2015-09-18 NOTE — Progress Notes (Signed)
6 fr sheath removed rfa and direct pressure held 10 minutes then 337fr veinous sheath removed rfv and pressure maintained until hemostasis achieved at 1315. DSG applied and instructions given with teach back.  Distal pulses present and bed rest starts at 1315.

## 2015-09-23 ENCOUNTER — Ambulatory Visit (HOSPITAL_COMMUNITY)
Admission: RE | Admit: 2015-09-23 | Discharge: 2015-09-23 | Disposition: A | Discharge status: Home / Self Care | Payer: BC Managed Care – PPO | Source: Ambulatory Visit | Attending: Cardiovascular Disease | Admitting: Cardiovascular Disease

## 2015-09-23 ENCOUNTER — Other Ambulatory Visit: Payer: Self-pay | Admitting: Cardiovascular Disease

## 2015-09-23 DIAGNOSIS — E119 Type 2 diabetes mellitus without complications: Secondary | ICD-10-CM | POA: Insufficient documentation

## 2015-09-23 DIAGNOSIS — I517 Cardiomegaly: Secondary | ICD-10-CM | POA: Diagnosis not present

## 2015-09-23 DIAGNOSIS — I77810 Thoracic aortic ectasia: Secondary | ICD-10-CM | POA: Diagnosis not present

## 2015-09-23 DIAGNOSIS — E785 Hyperlipidemia, unspecified: Secondary | ICD-10-CM | POA: Insufficient documentation

## 2015-09-23 DIAGNOSIS — I350 Nonrheumatic aortic (valve) stenosis: Secondary | ICD-10-CM | POA: Diagnosis not present

## 2015-09-23 NOTE — Progress Notes (Signed)
*  PRELIMINARY RESULTS* Echocardiogram 2D Echocardiogram has been performed.  Dennis Bates, Dennis Bates 09/23/2015, 3:11 PM

## 2015-09-24 ENCOUNTER — Encounter: Payer: Self-pay | Admitting: Surgery

## 2015-09-24 ENCOUNTER — Ambulatory Visit
Admission: RE | Admit: 2015-09-24 | Discharge: 2015-09-24 | Disposition: A | Discharge status: Home / Self Care | Payer: BC Managed Care – PPO | Source: Ambulatory Visit | Attending: Surgery | Admitting: Surgery

## 2015-09-24 ENCOUNTER — Ambulatory Visit (INDEPENDENT_AMBULATORY_CARE_PROVIDER_SITE_OTHER): Payer: BC Managed Care – PPO | Admitting: Surgery

## 2015-09-24 VITALS — BP 127/85 | HR 100 | Resp 20 | Ht 67.5 in | Wt 196.0 lb

## 2015-09-24 DIAGNOSIS — Q231 Congenital insufficiency of aortic valve: Secondary | ICD-10-CM

## 2015-09-24 DIAGNOSIS — I350 Nonrheumatic aortic (valve) stenosis: Secondary | ICD-10-CM

## 2015-09-24 DIAGNOSIS — Q2381 Bicuspid aortic valve: Secondary | ICD-10-CM

## 2015-09-24 MED ORDER — IOPAMIDOL (ISOVUE-370) INJECTION 76%
100.0000 mL | Freq: Once | INTRAVENOUS | Status: AC | PRN
  Administered 2015-09-24: 100 mL via INTRAVENOUS

## 2015-09-25 ENCOUNTER — Encounter: Payer: Self-pay | Admitting: Surgery

## 2015-09-25 ENCOUNTER — Other Ambulatory Visit: Payer: Self-pay | Admitting: *Deleted

## 2015-09-25 DIAGNOSIS — I712 Thoracic aortic aneurysm, without rupture: Secondary | ICD-10-CM

## 2015-09-25 DIAGNOSIS — I350 Nonrheumatic aortic (valve) stenosis: Secondary | ICD-10-CM

## 2015-09-25 DIAGNOSIS — I7121 Aneurysm of the ascending aorta, without rupture: Secondary | ICD-10-CM

## 2015-09-25 NOTE — Progress Notes (Signed)
HPI:  The patient returns today for further evaluation and discussion of of treatment options for his bicuspid aortic valve stenosis. I saw him for initial consultation on 04/06/2015. He is a 55 year old gentleman with type 2 DM, hyperlipidemia, CAD s/p inferior STEMI and PCI/DES to a 99% OM2 09/2014, known bicuspid aortic valve stenosis. When he saw cardiology on 10/01/2014 he reported some mild exertional dyspnea with walks. About a month later he was evaluated for dizziness and near syncope that was similar to his prodromal symptoms at the time of his MI. He noted at least two episodes of dizziness and flushing with near syncope that occurred at rest. His last echo on 11/12/2014 showed severe AS with a peak velocity of 421 cm/sec corresponding to a valve area of 0.79 cm2. The ascending aorta was 46 mm. LVEF was 60-65%. Mean AV gradient was 41 mm Hg. DI was 0.2. Since then he has not had any further episodes of dizziness but continues to have exertional fatigue and shortness of breath. Since he was still on Effient post DES he was observed but was referred for surgical evaluation in July. He did have some enlargement of his aorta on cath and I felt that he would need AVR and probably replacement of his ascending aorta. He wanted to wait until after November so that he could get health insurance prior to surgery. He underwent R/L heart cath on 09/18/2015 which showed a widely patent LCx stent and mild luminal irregularities in the LAD and RCA. A follow up echo on 09/23/2015 showed a bicuspid aortic valve that was severely thickened and calcified with restricted leaflet mobility and a mean gradient of 42 mm Hg. The aortic root was measured at 42 mm. A CT angio of the chest on 09/24/2015 showed a fusiform ascending aortic aneurysm with a maximum diameter of 4.6 cm. The descending aorta is 2.4 cm.   Current Outpatient Prescriptions  Medication Sig Dispense Refill  . aspirin EC 81 MG EC tablet Take 1 tablet (81 mg  total) by mouth daily.    Marland Kitchen atorvastatin (LIPITOR) 80 MG tablet Take 1 tablet (80 mg total) by mouth daily. (Patient taking differently: Take 40 mg by mouth daily. ) 30 tablet 11  . carvedilol (COREG) 3.125 MG tablet Take 1 tablet (3.125 mg total) by mouth 2 (two) times daily with a meal. 60 tablet 11  . fenofibrate 160 MG tablet Take 1 tablet (160 mg total) by mouth daily. 30 tablet 11  . glipiZIDE (GLUCOTROL XL) 5 MG 24 hr tablet Take 1 tablet (5 mg total) by mouth daily with breakfast. 30 tablet 11  . metFORMIN (GLUCOPHAGE) 1000 MG tablet Take 1,000 mg by mouth 2 (two) times daily with a meal.     . nitroGLYCERIN (NITROSTAT) 0.4 MG SL tablet Place 1 tablet (0.4 mg total) under the tongue every 5 (five) minutes x 3 doses as needed for chest pain. 25 tablet 12  . prasugrel (EFFIENT) 10 MG TABS tablet Take 1 tablet (10 mg total) by mouth daily. 30 tablet 11   No current facility-administered medications for this visit.     Physical Exam:  BP 127/85 mmHg  Pulse 100  Resp 20  Ht 5' 7.5" (1.715 m)  Wt 196 lb (88.905 kg)  BMI 30.23 kg/m2  SpO2 98% He looks well Cardiac exam shows a regular rate and rhythm with a 3/6 harsh systolic murmur along the RSB. Lungs are clear  Diagnostic Tests:  Dennis Bihari, MD (Primary)      Procedures    Right Heart Cath and Coronary Angiography    Conclusion     Prox RCA lesion, 25% stenosed.  Mid LAD lesion, 20% stenosed.  Previously documented severe aortic valve stenosis by echocardiography 10 months ago.  Supravalvular aortography demonstrating significant aortic root dilatation and a calcified aortic valve with markedly reduced excursion without significant aortic insufficiency.  Mild nonobstructive CAD with smooth 20% proximal LAD stenosis; widely patent left circumflex stent; and mild luminal irregularity of 25% in the proximal RCA.  RECOMMENDATION: The patient already has a surgical evaluation to see Dr. Laneta Bates on 09/24/2015.  A 2-D echo Doppler study will be scheduled to be done prior to that office visit. He is also scheduled to undergo CT angiography for sizing of his aortic root dilatation and potential need for aortic root replacement combined with aortic valve replacement.     Indications    CAD in native artery [I25.10 (ICD-10-CM)]   Aortic stenosis [I35.0 (ICD-10-CM)]   Aortic root dilatation (HCC) [U13.244 (ICD-10-CM)]    Technique and Indications    Mr. Dennis Bates is a 55 year old white male who is a patient of Dr. Herbie Bates. Has history of diabetes mellitus, hypertension, and in January 2016 underwent stenting of a 99% circumflex marginal stenosis in the setting of an MI. March 2016. An echo Doppler study suggested severe aortic stenosis with a valve area of 0.79 and a mean aortic gradient of 41. The patient has a history of a dilated aortic root. He had deferred further evaluation due to insurance issues. He recently was seen for preoperative cardiology assessment. Prior to planning to see Dr. Laneta Bates on 09/24/2015 for evaluation. He is now referred for right and left heart catheterization.  The patient was brought to the second floor  Cardiac cath lab in the fasting state. Versed 2 mg and fentanyl 50 mcg were administered for conscious sedation. The right groin was prepped and draped in sterile fashion and a 5 [place] arterial sheath and 7 French venous sheath were inserted without difficulty. A Swan-Ganz catheter was advanced into the venous sheath and pressures were obtained in the right atrium, right ventricle, pulmonary artery, and pulmonary capillary wedge position. Cardiac outputs were obtained by the thermodilution and assumed Fick methods. Oxygen saturation was obtained in the pulmonary artery and aorta. A pigtail catheter was inserted and simultaneous AO/PA pressures were recorded. The pigtail catheter was unable to be advanced into the left ventricle. Supravalvular aortography was  performed to document aortic valve excursion the potential for aortic insufficiency as well as to further evaluate the aortic root dilatation. The pigtail catheter was then removed and diagnostic catheterization to delineate the coronary anatomy was performed utilizing 5 French Judkins 4 left and right diagnostic catheters. A short trial using the RCA catheter and a Gibson Ramp catheter was done with a straight wire in attempt to see if the valve would able to be crossed easily. As the valve was not crossed easily. The decision was made not to attempt further crossing and an echo Doppler study will be performed to reconfirm the severity of the patient's aortic valve stenosis. All catheters were removed and the patient. Hemostasis was obtained by direct manual pressure. The patient tolerated the procedure well and returned to his room in satisfactory condition.  Estimated blood loss <50 mL. There were no immediate complications during the procedure.    Coronary Findings    Dominance: Right   Left Anterior Descending   .  Mid LAD lesion, 20% stenosed.     Ramus Intermedius  . Vessel is small.     Left Circumflex   . Dist Cx lesion, 0% stenosed. Previously placed Dist Cx stent (unknown type) is patent.     Right Coronary Artery   . Prox RCA lesion, 25% stenosed.       Right Heart Pressures Hemodynamic findings consistent with aortic stenosis. RA: a 6; v 5 mean 4 RV: 30/7 PA: 28/10 mean 19 PW: 13  AO: 110/69 PA: 24/7   Cardiac output by the thermodilution method 6.3, and by the Fick method 5.7 L/m. Cardiac index 3.1 and 2.8 L/m/m, respectively.    Coronary Diagrams    Diagnostic Diagram            Implants    Name ID Temporary Type Supply   No information to display    PACS Images    Show images for Cardiac catheterization     Link to Procedure Log    Procedure Log      Hemo Data       Most Recent Value   Fick Cardiac Output  5.7 L/min   Fick  Cardiac Output Index  2.81 (L/min)/BSA   Thermal Cardiac Output  6.28 L/min   Thermal Cardiac Output Index  3.09 (L/min)/BSA   RA A Wave  6 mmHg   RA V Wave  5 mmHg   RA Mean  4 mmHg   RV Systolic Pressure  30 mmHg   RV Diastolic Pressure  1 mmHg   RV EDP  7 mmHg   PA Systolic Pressure  24 mmHg   PA Diastolic Pressure  7 mmHg   PA Mean  18 mmHg   PW A Wave  13 mmHg   PW V Wave  13 mmHg   PW Mean  9 mmHg   AO Systolic Pressure  111 mmHg   AO Diastolic Pressure  70 mmHg   AO Mean  88 mmHg   TPVR Index  6.14 HRUI   TSVR Index  28.13 HRUI   PVR SVR Ratio  0.12   TPVR/TSVR Ratio  0.22       CLINICAL DATA: 55 year old male with history of aortic stenosis. Shortness of breath for several months.  EXAM: CT ANGIOGRAPHY CHEST WITH CONTRAST  TECHNIQUE: Multidetector CT imaging of the chest was performed using the standard protocol during bolus administration of intravenous contrast. Multiplanar CT image reconstructions and MIPs were obtained to evaluate the vascular anatomy.  CONTRAST: 100 mL of Isovue 370.  COMPARISON: No priors.  FINDINGS: Creatinine was obtained on site at [facility] at [address]  Results: Creatinine 0.7 mg/dL.  Mediastinum/Lymph Nodes: Heart size is normal. There is no significant pericardial fluid, thickening or pericardial calcification. There is atherosclerosis of the thoracic aorta, the great vessels of the mediastinum and the coronary arteries, including calcified atherosclerotic plaque in the left circumflex coronary artery. Severe thickening and calcification of the aortic valve. Mild aneurysmal dilatation of the ascending thoracic aorta which measures up to 4.6 cm in diameter. The arch is normal in caliber measuring 2.9 cm in diameter just distal to the origin of the left subclavian artery. Descending thoracic aorta is also normal in caliber measuring 2.4 cm in diameter. No evidence of  thoracic aortic dissection. Normal three-vessel right-sided aortic arch. No pathologically enlarged mediastinal or hilar lymph nodes. Esophagus is unremarkable in appearance. No axillary lymphadenopathy.  Lungs/Pleura: No suspicious appearing pulmonary nodules or masses. No acute  consolidative airspace disease. No pleural effusions. No pneumothorax.  Upper Abdomen: Diffuse low attenuation throughout the hepatic parenchyma, compatible with severe hepatic steatosis. Intermediate to high attenuation material layering dependently in the gallbladder, compatible with biliary sludge and/or noncalcified gallstones. No current findings to suggest an acute cholecystitis at this time.  Musculoskeletal/Soft Tissues: There are no aggressive appearing lytic or blastic lesions noted in the visualized portions of the skeleton.  Review of the MIP images confirms the above findings.  IMPRESSION: 1. Severely thickened and heavily calcified aortic valve, compatible with the reported clinical history of aortic stenosis. This is associated with mild aneurysmal dilatation of the ascending thoracic aorta which currently measures 4.6 cm in diameter. 2. Normal right sided 3 vessel aortic arch. 3. Atherosclerosis, including left circumflex coronary artery disease. Please note that although the presence of coronary artery calcium documents the presence of coronary artery disease, the severity of this disease and any potential stenosis cannot be assessed on this non-gated CT examination. Assessment for potential risk factor modification, dietary therapy or pharmacologic therapy may be warranted, if clinically indicated. 4. Severe hepatic steatosis. 5. Biliary sludge and/or noncalcified gallstones lying dependently in the gallbladder. No findings to suggest an acute cholecystitis at this time.   Electronically Signed  By: Trudie Reed M.D.  On: 09/24/2015 14:47     *[facility]*          *[facility]*            [address]            [place], [place] [address]              [phone]  ------------------------------------------------------------------- Transthoracic Echocardiography  Patient:  Delvon, Chipps MR #:    [id number] Study Date: 09/23/2015 Gender:   M Age:    42 Height:   172.7 cm Weight:   88.9 kg BSA:    2.09 m^2 Pt. Status: Room:  ATTENDING  Nicki Guadalajara, M.D. ORDERING   Nicki Guadalajara, M.D. REFERRING  Nicki Guadalajara, M.D. SONOGRAPHER [facility] PERFORMING  [facility], Outpatient  cc:  ------------------------------------------------------------------- LV EF: 55% -  60%  ------------------------------------------------------------------- Indications:   Aortic stenosis 424.1.  ------------------------------------------------------------------- History:  PMH: Aortic Root Dilitation. Coronary artery disease. PMH:  Myocardial infarction. Risk factors: Diabetes mellitus. Dyslipidemia.  ------------------------------------------------------------------- Study Conclusions  - Left ventricle: The cavity size was normal. There was moderate concentric hypertrophy. Systolic function was normal. The estimated ejection fraction was in the range of 55% to 60%. Wall motion was normal; there were no regional wall motion abnormalities. Features are consistent with a pseudonormal left ventricular filling pattern, with concomitant abnormal relaxation and increased filling pressure (grade 2 diastolic dysfunction). - Aortic valve: Possibly bicuspid; severely thickened, severely calcified leaflets. Valve mobility was restricted. There was severe stenosis. Peak velocity (S): 419 cm/s. Mean gradient (S): 42 mm Hg. Valve area (VTI): 0.66 cm^2. Valve area (Vmax): 0.63 cm^2. Valve area (Vmean): 0.66 cm^2. - Aorta: Aortic root  dimension: 42 mm (ED). - Aortic root: The aortic root was mildly dilated.  Impressions:  - No significant change from prior echocardiogram. Prior aortic root size was 45mm. Aortic stenosis remains severe.  Transthoracic echocardiography. M-mode, complete 2D, spectral Doppler, and color Doppler. Birthdate: Patient birthdate: [birth year]. Age: Patient is 55 yr old. Sex: Gender: male. BMI: 29.8 kg/m^2. Blood pressure:   124/90 Patient status: Inpatient. Study date: Study date: 09/23/2015. Study time: 02:16 PM. Location: Bedside.  -------------------------------------------------------------------  ------------------------------------------------------------------- Left ventricle: The cavity size was normal.  There was moderate concentric hypertrophy. Systolic function was normal. The estimated ejection fraction was in the range of 55% to 60%. Wall motion was normal; there were no regional wall motion abnormalities. Features are consistent with a pseudonormal left ventricular filling pattern, with concomitant abnormal relaxation and increased filling pressure (grade 2 diastolic dysfunction).  ------------------------------------------------------------------- Aortic valve: Poorly visualized. Possibly bicuspid; severely thickened, severely calcified leaflets. Valve mobility was restricted. Doppler:  There was severe stenosis.  There was no regurgitation.  VTI ratio of LVOT to aortic valve: 0.21. Valve area (VTI): 0.66 cm^2. Indexed valve area (VTI): 0.32 cm^2/m^2. Peak velocity ratio of LVOT to aortic valve: 0.2. Valve area (Vmax): 0.63 cm^2. Indexed valve area (Vmax): 0.3 cm^2/m^2. Mean velocity ratio of LVOT to aortic valve: 0.21. Valve area (Vmean): 0.66 cm^2. Indexed valve area (Vmean): 0.32 cm^2/m^2.  Mean gradient (S): 42 mm Hg. Peak gradient (S): 70 mm Hg.  ------------------------------------------------------------------- Aorta: Aortic root: The  aortic root was mildly dilated.  ------------------------------------------------------------------- Mitral valve:  Structurally normal valve.  Mobility was not restricted. Doppler: Transvalvular velocity was within the normal range. There was no evidence for stenosis. There was trivial regurgitation.  Peak gradient (D): 3 mm Hg.  ------------------------------------------------------------------- Left atrium: The atrium was normal in size.  ------------------------------------------------------------------- Right ventricle: The cavity size was normal. Wall thickness was normal. Systolic function was normal.  ------------------------------------------------------------------- Pulmonic valve:  Poorly visualized. Structurally normal valve. Cusp separation was normal. Doppler: Transvalvular velocity was within the normal range. There was no evidence for stenosis. There was no regurgitation.  ------------------------------------------------------------------- Tricuspid valve:  Structurally normal valve.  Doppler: Transvalvular velocity was within the normal range. There was no regurgitation.  ------------------------------------------------------------------- Pulmonary artery:  The main pulmonary artery was normal-sized. Systolic pressure was within the normal range.  ------------------------------------------------------------------- Right atrium: The atrium was normal in size.  ------------------------------------------------------------------- Pericardium: There was no pericardial effusion.  ------------------------------------------------------------------- Systemic veins: Inferior vena cava: The vessel was normal in size.  ------------------------------------------------------------------- Measurements  Left ventricle              Value     Reference LV ID, ED, PLAX chordal      (L)   39.8 mm    43 - 52 LV ID, ES, PLAX  chordal          25.9 mm    23 - 38 LV fx shortening, PLAX chordal      35  %    >=29 LV PW thickness, ED            16.2 mm    --------- IVS/LV PW ratio, ED            0.91      <=1.3 Stroke volume, 2D             62  ml    --------- Stroke volume/bsa, 2D           30  ml/m^2  --------- LV e&', lateral              6.2  cm/s   --------- LV E/e&', lateral             15.02     --------- LV e&', medial               4.57 cm/s   --------- LV E/e&', medial              20.37     --------- LV e&', average  5.39 cm/s   --------- LV E/e&', average             17.29     ---------  Ventricular septum            Value     Reference IVS thickness, ED             14.8 mm    ---------  LVOT                   Value     Reference LVOT ID, S                20  mm    --------- LVOT area                 3.14 cm^2   --------- LVOT peak velocity, S           84.1 cm/s   --------- LVOT mean velocity, S           61.8 cm/s   --------- LVOT VTI, S                19.6 cm    ---------  Aortic valve               Value     Reference Aortic valve peak velocity, S       419  cm/s   --------- Aortic valve mean velocity, S       294  cm/s   --------- Aortic valve VTI, S            93.2 cm    --------- Aortic mean gradient, S          42  mm Hg  --------- Aortic peak gradient, S          70  mm Hg  --------- VTI ratio, LVOT/AV            0.21      --------- Aortic valve area, VTI          0.66 cm^2   --------- Aortic valve area/bsa, VTI         0.32 cm^2/m^2 --------- Velocity ratio, peak, LVOT/AV       0.2      --------- Aortic valve area, peak velocity     0.63 cm^2   --------- Aortic valve area/bsa, peak        0.3  cm^2/m^2 --------- velocity Velocity ratio, mean, LVOT/AV       0.21      --------- Aortic valve area, mean velocity     0.66 cm^2   --------- Aortic valve area/bsa, mean        0.32 cm^2/m^2 --------- velocity Aortic regurg pressure half-time     401  ms    ---------  Aorta                   Value     Reference Aortic root ID, ED            42  mm    ---------  Left atrium                Value     Reference LA ID, A-P, ES              33  mm    --------- LA ID/bsa, A-P              1.58 cm/m^2  <=2.2 LA volume, ES,  1-p A4C          31.2 ml    --------- LA volume/bsa, ES, 1-p A4C        14.9 ml/m^2  --------- LA volume, ES, 1-p A2C          42.9 ml    --------- LA volume/bsa, ES, 1-p A2C        20.5 ml/m^2  ---------  Mitral valve               Value     Reference Mitral E-wave peak velocity        93.1 cm/s   --------- Mitral A-wave peak velocity        75.2 cm/s   --------- Mitral deceleration time         176  ms    150 - 230 Mitral peak gradient, D          3   mm Hg  --------- Mitral E/A ratio, peak          1.2      ---------  Systemic veins              Value     Reference Estimated CVP               3   mm Hg  ---------  Right ventricle              Value     Reference TAPSE                   18  mm    ---------  Legend: (L) and (H) mark values outside specified reference  range.  ------------------------------------------------------------------- Prepared and Electronically Authenticated by  Donato SchultzMark Skains, M.D. 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 His LCX stent is widely patent and there is no other significant coronary stenosis. I think the best treatment is a Bentall procedure to replace his valve and aorta using a mechanical valved graft. I discussed the benefits and risks of mechanical and tissue valves with the patient and his wife. He is only 54 and a tissue valve would have a limited lifespan for him and may only last less than 10 years. He has no contraindication to being on Coumadin and is a very compliant patient. He and his wife are in agreement with a mechanical valve and understand the need for lifelong anticoagulation with Coumadin with this valve. I discussed the operative procedure with the patient and his wife including alternatives, benefits and risks; including but not limited to bleeding, blood transfusion, infection, stroke, myocardial infarction, graft failure, heart block requiring a permanent pacemaker, organ dysfunction, and death.  Dennis Conversehomas Koker understands and agrees to proceed.  We will schedule surgery for Thursday 10/08/2014. He will stop his Effient now since he has been on it for a year and will continue his aspirin. He will hold his Metformin for 48 hrs preop.  Plan:  Bentall Procedure using a mechanical valved graft on 10/08/2014.   Alleen BorneBryan K Bartle, MD [facility] [phone]

## 2015-10-07 ENCOUNTER — Encounter (HOSPITAL_COMMUNITY)
Admission: RE | Admit: 2015-10-07 | Discharge: 2015-10-07 | Disposition: A | Discharge status: Home / Self Care | Payer: BC Managed Care – PPO | Source: Ambulatory Visit | Attending: Surgery | Admitting: Surgery

## 2015-10-07 ENCOUNTER — Ambulatory Visit (HOSPITAL_COMMUNITY)
Admission: RE | Admit: 2015-10-07 | Discharge: 2015-10-07 | Disposition: A | Discharge status: Home / Self Care | Payer: BC Managed Care – PPO | Source: Ambulatory Visit | Attending: Surgery | Admitting: Surgery

## 2015-10-07 ENCOUNTER — Encounter (HOSPITAL_COMMUNITY): Payer: Self-pay

## 2015-10-07 ENCOUNTER — Ambulatory Visit (HOSPITAL_BASED_OUTPATIENT_CLINIC_OR_DEPARTMENT_OTHER)
Admission: RE | Admit: 2015-10-07 | Discharge: 2015-10-07 | Disposition: A | Discharge status: Home / Self Care | Payer: BC Managed Care – PPO | Source: Ambulatory Visit | Attending: Surgery | Admitting: Surgery

## 2015-10-07 VITALS — BP 122/93 | HR 70 | Temp 97.7°F | Resp 20 | Ht 67.5 in | Wt 190.4 lb

## 2015-10-07 DIAGNOSIS — I712 Thoracic aortic aneurysm, without rupture: Secondary | ICD-10-CM

## 2015-10-07 DIAGNOSIS — Z01812 Encounter for preprocedural laboratory examination: Secondary | ICD-10-CM | POA: Insufficient documentation

## 2015-10-07 DIAGNOSIS — I2510 Atherosclerotic heart disease of native coronary artery without angina pectoris: Secondary | ICD-10-CM | POA: Diagnosis not present

## 2015-10-07 DIAGNOSIS — I350 Nonrheumatic aortic (valve) stenosis: Secondary | ICD-10-CM | POA: Diagnosis not present

## 2015-10-07 DIAGNOSIS — Z01818 Encounter for other preprocedural examination: Secondary | ICD-10-CM | POA: Diagnosis present

## 2015-10-07 DIAGNOSIS — Z0183 Encounter for blood typing: Secondary | ICD-10-CM | POA: Insufficient documentation

## 2015-10-07 DIAGNOSIS — E119 Type 2 diabetes mellitus without complications: Secondary | ICD-10-CM | POA: Diagnosis not present

## 2015-10-07 DIAGNOSIS — E785 Hyperlipidemia, unspecified: Secondary | ICD-10-CM | POA: Diagnosis not present

## 2015-10-07 DIAGNOSIS — I7121 Aneurysm of the ascending aorta, without rupture: Secondary | ICD-10-CM

## 2015-10-07 HISTORY — DX: Essential (primary) hypertension: I10

## 2015-10-07 LAB — COMPREHENSIVE METABOLIC PANEL
ALT: 34 U/L (ref 17–63)
AST: 23 U/L (ref 15–41)
Albumin: 4.6 g/dL (ref 3.5–5.0)
Alkaline Phosphatase: 33 U/L — ABNORMAL LOW (ref 38–126)
Anion gap: 11 (ref 5–15)
BUN: 12 mg/dL (ref 6–20)
CO2: 21 mmol/L — ABNORMAL LOW (ref 22–32)
Calcium: 10 mg/dL (ref 8.9–10.3)
Chloride: 107 mmol/L (ref 101–111)
Creatinine, Ser: 0.76 mg/dL (ref 0.61–1.24)
GFR calc Af Amer: >60 mL/min (ref >60)
GFR calc non Af Amer: >60 mL/min (ref >60)
Glucose, Bld: 159 mg/dL — ABNORMAL HIGH (ref 65–99)
Potassium: 4.4 mmol/L (ref 3.5–5.1)
Sodium: 139 mmol/L (ref 135–145)
Total Bilirubin: 0.9 mg/dL (ref 0.3–1.2)
Total Protein: 7 g/dL (ref 6.5–8.1)

## 2015-10-07 LAB — PULMONARY FUNCTION TEST
DL/VA % pred: 97 %
DL/VA: 4.4 ml/min/mmHg/L
DLCO cor % pred: 98 %
DLCO cor: 29.24 ml/min/mmHg
DLCO unc % pred: 100 %
DLCO unc: 29.73 ml/min/mmHg
FEF 25-75 Post: 3.57 L/sec
FEF 25-75 Pre: 3.97 L/sec
FEF2575-%Change-Post: -10 %
FEF2575-%Pred-Post: 115 %
FEF2575-%Pred-Pre: 128 %
FEV1-%Change-Post: -2 %
FEV1-%Pred-Post: 105 %
FEV1-%Pred-Pre: 108 %
FEV1-Post: 3.76 L
FEV1-Pre: 3.84 L
FEV1FVC-%Change-Post: -1 %
FEV1FVC-%Pred-Pre: 106 %
FEV6-%Change-Post: -1 %
FEV6-%Pred-Post: 103 %
FEV6-%Pred-Pre: 105 %
FEV6-Post: 4.6 L
FEV6-Pre: 4.66 L
FEV6FVC-%Pred-Post: 104 %
FEV6FVC-%Pred-Pre: 104 %
FVC-%Change-Post: 0 %
FVC-%Pred-Post: 100 %
FVC-%Pred-Pre: 100 %
FVC-Post: 4.63 L
FVC-Pre: 4.66 L
Post FEV1/FVC ratio: 81 %
Post FEV6/FVC ratio: 100 %
Pre FEV1/FVC ratio: 82 %
Pre FEV6/FVC Ratio: 100 %
RV % pred: 91 %
RV: 1.84 L
TLC % pred: 100 %
TLC: 6.62 L

## 2015-10-07 LAB — PROTIME-INR
INR: 1.01 (ref 0.00–1.49)
Prothrombin Time: 13.5 seconds (ref 11.6–15.2)

## 2015-10-07 LAB — URINALYSIS, ROUTINE W REFLEX MICROSCOPIC
Bilirubin Urine: NEGATIVE
Glucose, UA: 250 mg/dL — AB
Hgb urine dipstick: NEGATIVE
Ketones, ur: NEGATIVE mg/dL
Leukocytes, UA: NEGATIVE
Nitrite: NEGATIVE
Protein, ur: NEGATIVE mg/dL
Specific Gravity, Urine: 1.023 (ref 1.005–1.030)
pH: 5 (ref 5.0–8.0)

## 2015-10-07 LAB — GLUCOSE, CAPILLARY: Glucose-Capillary: 141 mg/dL — ABNORMAL HIGH (ref 65–99)

## 2015-10-07 LAB — SURGICAL PCR SCREEN
MRSA, PCR: NEGATIVE
Staphylococcus aureus: POSITIVE — AB

## 2015-10-07 LAB — ABO/RH: ABO/RH(D): AB POS

## 2015-10-07 LAB — CBC
HCT: 42.4 % (ref 39.0–52.0)
Hemoglobin: 15.2 g/dL (ref 13.0–17.0)
MCH: 30.2 pg (ref 26.0–34.0)
MCHC: 35.8 g/dL (ref 30.0–36.0)
MCV: 84.3 fL (ref 78.0–100.0)
Platelets: 245 10*3/uL (ref 150–400)
RBC: 5.03 MIL/uL (ref 4.22–5.81)
RDW: 12.6 % (ref 11.5–15.5)
WBC: 6.8 10*3/uL (ref 4.0–10.5)

## 2015-10-07 LAB — BLOOD GAS, ARTERIAL
Acid-base deficit: 1.1 mmol/L (ref 0.0–2.0)
Bicarbonate: 23.2 mEq/L (ref 20.0–24.0)
Drawn by: 206361
FIO2: 0.21
O2 Saturation: 95.5 %
Patient temperature: 98.6
TCO2: 24.4 mmol/L (ref 0–100)
pCO2 arterial: 38.9 mmHg (ref 35.0–45.0)
pH, Arterial: 7.392 (ref 7.350–7.450)
pO2, Arterial: 81.3 mmHg (ref 80.0–100.0)

## 2015-10-07 LAB — APTT: aPTT: 31 seconds (ref 24–37)

## 2015-10-07 MED ORDER — ALBUTEROL SULFATE (2.5 MG/3ML) 0.083% IN NEBU
2.5000 mg | INHALATION_SOLUTION | Freq: Once | RESPIRATORY_TRACT | Status: AC
  Administered 2015-10-07: 2.5 mg via RESPIRATORY_TRACT

## 2015-10-07 NOTE — Progress Notes (Addendum)
VASCULAR LAB PRELIMINARY  PRELIMINARY  PRELIMINARY  PRELIMINARY  Carotid Duplex has been completed. Vertebral artery flow is antegrade. No evidence of ICA stenosis or plaque bilaterally. VASCULAR LAB PRELIMINARY RESULTS  Upper Extremity Arterial completed    RIGHT   LEFT    PRESSURE WAVEFORM  PRESSURE WAVEFORM  SUBCLAVIAN NA triphasic SUBCLAVIAN NA triphasic  BRACHIAL 125 triphasic BRACHIAL 111 triphasic  RADIAL  triphasic RADIAL  triphasic  ULNAR  triphasic ULNAR  triphasic    RIGHT LEFT  PALMAR ARCH Normal in both radial artery and ulnar artery with compression. Decreased >50% in Left Radial artery with compression.   Findings: Doppler waveforms indicate a decreased >50 left radial artery with compression.  Doppler Waveforms remain normal in right arm with both radial and ulnar compression. Left ulnar artery waveforms remain normal with compression.  Latamara Melder, RVT, RDMS 10/07/2015, 10:57 AM

## 2015-10-07 NOTE — Pre-Procedure Instructions (Addendum)
Dennis Bates  10/07/2015      [facility] - [place] - [address] [place] [address] Phone: [phone] Fax: [phone]    Your procedure is scheduled on Thursday, October 09, 2015 at 7:30 AM.   Report to [facility] Entrance "A" Admitting Office at 5:30 AM.   Call this number if you have problems the morning of surgery: [phone]   Any questions prior to day of surgery, please call [phone] between 8 & 4 PM.   Remember:  Do not eat food or drink liquids after midnight Wednesday, 10/08/15.  Take these medicines the morning of surgery with A SIP OF WATER: Carvedilol (Coreg)  Follow Dr. Sharee Pimple instructions on holding Metformin  How to Manage Your Diabetes Before Surgery   Why is it important to control my blood sugar before and after surgery?   Improving blood sugar levels before and after surgery helps healing and can limit problems.  A way of improving blood sugar control is eating a healthy diet by:  - Eating less sugar and carbohydrates  - Increasing activity/exercise  - Talk with your doctor about reaching your blood sugar goals  High blood sugars (greater than 180 mg/dL) can raise your risk of infections and slow down your recovery so you will need to focus on controlling your diabetes during the weeks before surgery.  Make sure that the doctor who takes care of your diabetes knows about your planned surgery including the date and location.  How do I manage my blood sugars before surgery?   Check your blood sugar at least 4 times a day, 2 days before surgery to make sure that they are not too high or low.  Check your blood sugar the morning of your surgery when you wake up and every 2 hours until you get to the Short-Stay unit.  Treat a low blood sugar (less than 70 mg/dL) with 1/2 cup of clear juice (cranberry or apple), 4 glucose tablets, OR glucose gel.  Recheck blood sugar in 15  minutes after treatment (to make sure it is greater than 70 mg/dL).  If blood sugar is not greater than 70 mg/dL on re-check, call [phone] for further instructions.   Report your blood sugar to the Short-Stay nurse when you get to Short-Stay.  References:  [facility], 2007 "How to Manage your Diabetes Before and After Surgery".  What do I do about my diabetes medications?   Do not take oral diabetes medicines (pills) the morning of surgery.(glipizide)  Stop metformin 48 hours prior to surgery per dr(10/07/15)   Do not wear jewelry.  Do not wear lotions, powders, or cologne.  You may NOT wear deodorant.  Men may shave face and neck.  Do not bring valuables to the hospital.  [facility] is not responsible for any belongings or valuables.  Contacts, dentures or bridgework may not be worn into surgery.  Leave your suitcase in the car.  After surgery it may be brought to your room.  For patients admitted to the hospital, discharge time will be determined by your treatment team.  Special instructions:   Special Instructions: [facility] - Preparing for Surgery  Before surgery, you can play an important role.  Because skin is not sterile, your skin needs to be as free of germs as possible.  You can reduce the number of germs on you skin by washing with CHG (chlorahexidine gluconate) soap before surgery.  CHG is an antiseptic cleaner which kills germs and bonds with the skin to continue killing germs even after washing.  Please DO NOT use if you have an allergy to CHG or antibacterial soaps.  If your skin becomes reddened/irritated stop using the CHG and inform your nurse when you arrive at Short Stay.  Do not shave (including legs and underarms) for at least 48 hours prior to the first CHG shower.  You may shave your face.  Please follow these instructions carefully:   1.  Shower with CHG Soap the night before surgery and the morning of Surgery.  2.  If you  choose to wash your hair, wash your hair first as usual with your normal shampoo.  3.  After you shampoo, rinse your hair and body thoroughly to remove the Shampoo.  4.  Use CHG as you would any other liquid soap.  You can apply chg directly  to the skin and wash gently with scrungie or a clean washcloth.  5.  Apply the CHG Soap to your body ONLY FROM THE NECK DOWN.  Do not use on open wounds or open sores.  Avoid contact with your eyes ears, mouth and genitals (private parts).  Wash genitals (private parts)       with your normal soap.  6.  Wash thoroughly, paying special attention to the area where your surgery will be performed.  7.  Thoroughly rinse your body with warm water from the neck down.  8.  DO NOT shower/wash with your normal soap after using and rinsing off the CHG Soap.  9.  Pat yourself dry with a clean towel.            10.  Wear clean pajamas.            11.  Place clean sheets on your bed the night of your first shower and do not sleep with pets.  Day of Surgery  Do not apply any lotions/deodorants the morning of surgery.  Please wear clean clothes to the hospital/surgery center.  Please read over the following fact sheets that you were given. Pain Booklet, Coughing and Deep Breathing, Blood Transfusion Information, MRSA Information and Surgical Site Infection Prevention

## 2015-10-08 LAB — HEMOGLOBIN A1C
Hgb A1c MFr Bld: 8.9 % — ABNORMAL HIGH (ref 4.8–5.6)
Mean Plasma Glucose: 209 mg/dL

## 2015-10-08 MED ORDER — METOPROLOL TARTRATE 12.5 MG HALF TABLET: 12.5000 mg | ORAL_TABLET | Freq: Once | ORAL | Status: DC

## 2015-10-08 MED ORDER — POTASSIUM CHLORIDE 2 MEQ/ML IV SOLN
80.0000 meq | INTRAVENOUS | Status: DC
  Filled 2015-10-08: qty 40

## 2015-10-08 MED ORDER — NITROGLYCERIN IN D5W 200-5 MCG/ML-% IV SOLN
2.0000 ug/min | INTRAVENOUS | Status: DC
  Filled 2015-10-08: qty 250

## 2015-10-08 MED ORDER — CHLORHEXIDINE GLUCONATE 4 % EX LIQD: 30.0000 mL | CUTANEOUS | Status: DC

## 2015-10-08 MED ORDER — PLASMA-LYTE 148 IV SOLN
INTRAVENOUS | Status: AC
  Administered 2015-10-09: 500 mL
  Filled 2015-10-08: qty 2.5

## 2015-10-08 MED ORDER — MAGNESIUM SULFATE 50 % IJ SOLN
40.0000 meq | INTRAMUSCULAR | Status: DC
  Filled 2015-10-08: qty 10

## 2015-10-08 MED ORDER — SODIUM CHLORIDE 0.9 % IV SOLN
INTRAVENOUS | Status: AC
  Administered 2015-10-09: 1 [IU]/h via INTRAVENOUS
  Administered 2015-10-09: 2.2 [IU]/h via INTRAVENOUS
  Filled 2015-10-08: qty 2.5

## 2015-10-08 MED ORDER — PHENYLEPHRINE HCL 10 MG/ML IJ SOLN
30.0000 ug/min | INTRAVENOUS | Status: DC
  Filled 2015-10-08: qty 2

## 2015-10-08 MED ORDER — SODIUM CHLORIDE 0.9 % IV SOLN
INTRAVENOUS | Status: DC
  Filled 2015-10-08: qty 30

## 2015-10-08 MED ORDER — CHLORHEXIDINE GLUCONATE 0.12 % MT SOLN
15.0000 mL | Freq: Once | OROMUCOSAL | Status: AC
  Administered 2015-10-09: 15 mL via OROMUCOSAL
  Filled 2015-10-08: qty 15

## 2015-10-08 MED ORDER — EPINEPHRINE HCL 1 MG/ML IJ SOLN
0.0000 ug/min | INTRAVENOUS | Status: DC
  Filled 2015-10-08: qty 4

## 2015-10-08 MED ORDER — DOPAMINE-DEXTROSE 3.2-5 MG/ML-% IV SOLN
0.0000 ug/kg/min | INTRAVENOUS | Status: DC
  Filled 2015-10-08 (×2): qty 250

## 2015-10-08 MED ORDER — VANCOMYCIN HCL 10 G IV SOLR
1500.0000 mg | INTRAVENOUS | Status: AC
  Administered 2015-10-09: 1500 mg via INTRAVENOUS
  Filled 2015-10-08: qty 1500

## 2015-10-08 MED ORDER — SODIUM CHLORIDE 0.9 % IV SOLN
INTRAVENOUS | Status: AC
  Administered 2015-10-09: 70 mL/h via INTRAVENOUS
  Filled 2015-10-08: qty 40

## 2015-10-08 MED ORDER — DEXTROSE 5 % IV SOLN
750.0000 mg | INTRAVENOUS | Status: DC
  Filled 2015-10-08: qty 750

## 2015-10-08 MED ORDER — DEXMEDETOMIDINE HCL IN NACL 400 MCG/100ML IV SOLN
0.1000 ug/kg/h | INTRAVENOUS | Status: AC
  Administered 2015-10-09: .3 ug/kg/h via INTRAVENOUS
  Filled 2015-10-08: qty 100

## 2015-10-08 MED ORDER — DEXTROSE 5 % IV SOLN
1.5000 g | INTRAVENOUS | Status: AC
  Administered 2015-10-09: 1.5 g via INTRAVENOUS
  Administered 2015-10-09: .75 g via INTRAVENOUS
  Filled 2015-10-08 (×2): qty 1.5

## 2015-10-08 NOTE — Anesthesia Preprocedure Evaluation (Addendum)
Anesthesia Evaluation  Patient identified by MRN, date of birth, ID band Patient awake    Reviewed: Allergy & Precautions, NPO status , Patient's Chart, lab work & pertinent test results  Airway Mallampati: II  TM Distance: >3 FB Neck ROM: Full    Dental no notable dental hx.    Pulmonary shortness of breath,    Pulmonary exam normal breath sounds clear to auscultation       Cardiovascular hypertension, Pt. on medications + CAD, + Past MI, + Cardiac Stents and + Peripheral Vascular Disease  Normal cardiovascular exam+ Valvular Problems/Murmurs AS  Rhythm:Regular Rate:Normal  Echo 1//2017 - Left ventricle: The cavity size was normal. There was moderateconcentric hypertrophy. Systolic function was normal. Theestimated ejection fraction was in the range of 55% to 60%. Wallmotion was normal; there were no regional wall motionabnormalities. Features are consistent with a pseudonormal leftventricular filling pattern, with concomitant abnormal relaxationand increased filling pressure (grade 2 diastolic dysfunction). - Aortic valve: Possibly bicuspid; severely thickened, severelycalcified leaflets. Valve mobility was restricted. There wassevere stenosis. Peak velocity (S): 419 cm/s. Mean gradient (S):42 mm Hg. Valve area (VTI): 0.66 cm^2. Valve area (Vmax): 0.63 cm^2. Valve area (Vmean): 0.66 cm^2. - Aorta: Aortic root dimension: 42 mm (ED). - Aortic root: The aortic root was mildly dilated.  Impressions: - No significant change from prior echocardiogram. Prior aorticroot size was 45mm. Aortic stenosis remains severe.   Neuro/Psych negative neurological ROS  negative psych ROS   GI/Hepatic negative GI ROS, Neg liver ROS,   Endo/Other  diabetes, Type 2  Renal/GU negative Renal ROS     Musculoskeletal negative musculoskeletal ROS (+)   Abdominal   Peds  Hematology negative hematology ROS (+)   Anesthesia Other  Findings   Reproductive/Obstetrics                            Anesthesia Physical Anesthesia Plan  ASA: IV  Anesthesia Plan: General   Post-op Pain Management:    Induction: Intravenous  Airway Management Planned: Oral ETT  Additional Equipment: Arterial line, PA Cath, TEE, CVP and Ultrasound Guidance Line Placement  Intra-op Plan:   Post-operative Plan: Post-operative intubation/ventilation  Informed Consent: I have reviewed the patients History and Physical, chart, labs and discussed the procedure including the risks, benefits and alternatives for the proposed anesthesia with the patient or authorized representative who has indicated his/her understanding and acceptance.   Dental advisory given  Plan Discussed with: CRNA  Anesthesia Plan Comments:         Anesthesia Quick Evaluation

## 2015-10-09 ENCOUNTER — Encounter (HOSPITAL_COMMUNITY)
Admission: AD | Disposition: A | Discharge status: Home / Self Care | Payer: BC Managed Care – PPO | Source: Ambulatory Visit | Attending: Surgery

## 2015-10-09 ENCOUNTER — Inpatient Hospital Stay (HOSPITAL_COMMUNITY): Payer: BC Managed Care – PPO

## 2015-10-09 ENCOUNTER — Encounter (HOSPITAL_COMMUNITY): Payer: Self-pay | Admitting: *Deleted

## 2015-10-09 ENCOUNTER — Inpatient Hospital Stay (HOSPITAL_COMMUNITY): Payer: BC Managed Care – PPO | Admitting: Anesthesiology

## 2015-10-09 ENCOUNTER — Inpatient Hospital Stay (HOSPITAL_COMMUNITY)
Admission: AD | Admit: 2015-10-09 | Discharge: 2015-10-17 | DRG: 220 | Disposition: A | Discharge status: Home / Self Care | Payer: BC Managed Care – PPO | Source: Ambulatory Visit | Attending: Surgery | Admitting: Surgery

## 2015-10-09 ENCOUNTER — Inpatient Hospital Stay (HOSPITAL_COMMUNITY)
Admission: RE | Admit: 2015-10-09 | Discharge: 2015-10-09 | Disposition: A | Discharge status: Home / Self Care | Payer: BC Managed Care – PPO | Source: Ambulatory Visit | Attending: Surgery | Admitting: Surgery

## 2015-10-09 DIAGNOSIS — Z7984 Long term (current) use of oral hypoglycemic drugs: Secondary | ICD-10-CM | POA: Diagnosis not present

## 2015-10-09 DIAGNOSIS — I712 Thoracic aortic aneurysm, without rupture: Secondary | ICD-10-CM | POA: Diagnosis present

## 2015-10-09 DIAGNOSIS — I252 Old myocardial infarction: Secondary | ICD-10-CM

## 2015-10-09 DIAGNOSIS — Z952 Presence of prosthetic heart valve: Secondary | ICD-10-CM

## 2015-10-09 DIAGNOSIS — D62 Acute posthemorrhagic anemia: Secondary | ICD-10-CM | POA: Diagnosis not present

## 2015-10-09 DIAGNOSIS — E8770 Fluid overload, unspecified: Secondary | ICD-10-CM | POA: Diagnosis not present

## 2015-10-09 DIAGNOSIS — E781 Pure hyperglyceridemia: Secondary | ICD-10-CM | POA: Diagnosis present

## 2015-10-09 DIAGNOSIS — I10 Essential (primary) hypertension: Secondary | ICD-10-CM | POA: Diagnosis present

## 2015-10-09 DIAGNOSIS — K5900 Constipation, unspecified: Secondary | ICD-10-CM | POA: Diagnosis not present

## 2015-10-09 DIAGNOSIS — E119 Type 2 diabetes mellitus without complications: Secondary | ICD-10-CM | POA: Diagnosis present

## 2015-10-09 DIAGNOSIS — Z955 Presence of coronary angioplasty implant and graft: Secondary | ICD-10-CM

## 2015-10-09 DIAGNOSIS — I350 Nonrheumatic aortic (valve) stenosis: Secondary | ICD-10-CM | POA: Diagnosis not present

## 2015-10-09 DIAGNOSIS — K760 Fatty (change of) liver, not elsewhere classified: Secondary | ICD-10-CM | POA: Diagnosis present

## 2015-10-09 DIAGNOSIS — J939 Pneumothorax, unspecified: Secondary | ICD-10-CM

## 2015-10-09 DIAGNOSIS — R000 Tachycardia, unspecified: Secondary | ICD-10-CM | POA: Diagnosis not present

## 2015-10-09 DIAGNOSIS — Z79899 Other long term (current) drug therapy: Secondary | ICD-10-CM | POA: Diagnosis not present

## 2015-10-09 DIAGNOSIS — I2510 Atherosclerotic heart disease of native coronary artery without angina pectoris: Secondary | ICD-10-CM | POA: Diagnosis present

## 2015-10-09 DIAGNOSIS — I714 Abdominal aortic aneurysm, without rupture: Secondary | ICD-10-CM | POA: Diagnosis not present

## 2015-10-09 DIAGNOSIS — G510 Bell's palsy: Secondary | ICD-10-CM | POA: Diagnosis present

## 2015-10-09 DIAGNOSIS — I7121 Aneurysm of the ascending aorta, without rupture: Secondary | ICD-10-CM

## 2015-10-09 DIAGNOSIS — Q230 Congenital stenosis of aortic valve: Secondary | ICD-10-CM

## 2015-10-09 DIAGNOSIS — Z7982 Long term (current) use of aspirin: Secondary | ICD-10-CM

## 2015-10-09 DIAGNOSIS — Q231 Congenital insufficiency of aortic valve: Principal | ICD-10-CM

## 2015-10-09 HISTORY — PX: TEE WITHOUT CARDIOVERSION: SHX5443

## 2015-10-09 HISTORY — PX: BENTALL PROCEDURE: SHX5058

## 2015-10-09 HISTORY — DX: Presence of prosthetic heart valve: Z95.2

## 2015-10-09 LAB — POCT I-STAT 3, ART BLOOD GAS (G3+)
Acid-Base Excess: 3 mmol/L — ABNORMAL HIGH (ref 0.0–2.0)
Acid-Base Excess: 6 mmol/L — ABNORMAL HIGH (ref 0.0–2.0)
Acid-Base Excess: 4 mmol/L — ABNORMAL HIGH (ref 0.0–2.0)
Acid-base deficit: 3 mmol/L — ABNORMAL HIGH (ref 0.0–2.0)
Acid-base deficit: 5 mmol/L — ABNORMAL HIGH (ref 0.0–2.0)
Acid-base deficit: 3 mmol/L — ABNORMAL HIGH (ref 0.0–2.0)
Bicarbonate: 28.4 mEq/L — ABNORMAL HIGH (ref 20.0–24.0)
Bicarbonate: 30.7 mEq/L — ABNORMAL HIGH (ref 20.0–24.0)
Bicarbonate: 30.2 mEq/L — ABNORMAL HIGH (ref 20.0–24.0)
Bicarbonate: 22.5 mEq/L (ref 20.0–24.0)
Bicarbonate: 22.8 mEq/L (ref 20.0–24.0)
Bicarbonate: 21 mEq/L (ref 20.0–24.0)
Bicarbonate: 23.4 mEq/L (ref 20.0–24.0)
O2 Saturation: 100 %
O2 Saturation: 100 %
O2 Saturation: 100 %
O2 Saturation: 100 %
O2 Saturation: 94 %
O2 Saturation: 98 %
O2 Saturation: 98 %
Patient temperature: 36.7
Patient temperature: 97.6
Patient temperature: 36.3
TCO2: 30 mmol/L (ref 0–100)
TCO2: 32 mmol/L (ref 0–100)
TCO2: 32 mmol/L (ref 0–100)
TCO2: 23 mmol/L (ref 0–100)
TCO2: 24 mmol/L (ref 0–100)
TCO2: 22 mmol/L (ref 0–100)
TCO2: 25 mmol/L (ref 0–100)
pCO2 arterial: 46.6 mmHg — ABNORMAL HIGH (ref 35.0–45.0)
pCO2 arterial: 47.7 mmHg — ABNORMAL HIGH (ref 35.0–45.0)
pCO2 arterial: 52.3 mmHg — ABNORMAL HIGH (ref 35.0–45.0)
pCO2 arterial: 30.3 mmHg — ABNORMAL LOW (ref 35.0–45.0)
pCO2 arterial: 43.8 mmHg (ref 35.0–45.0)
pCO2 arterial: 40.9 mmHg (ref 35.0–45.0)
pCO2 arterial: 43.1 mmHg (ref 35.0–45.0)
pH, Arterial: 7.393 (ref 7.350–7.450)
pH, Arterial: 7.416 (ref 7.350–7.450)
pH, Arterial: 7.37 (ref 7.350–7.450)
pH, Arterial: 7.48 — ABNORMAL HIGH (ref 7.350–7.450)
pH, Arterial: 7.324 — ABNORMAL LOW (ref 7.350–7.450)
pH, Arterial: 7.316 — ABNORMAL LOW (ref 7.350–7.450)
pH, Arterial: 7.339 — ABNORMAL LOW (ref 7.350–7.450)
pO2, Arterial: 410 mmHg — ABNORMAL HIGH (ref 80.0–100.0)
pO2, Arterial: 664 mmHg — ABNORMAL HIGH (ref 80.0–100.0)
pO2, Arterial: 618 mmHg — ABNORMAL HIGH (ref 80.0–100.0)
pO2, Arterial: 258 mmHg — ABNORMAL HIGH (ref 80.0–100.0)
pO2, Arterial: 78 mmHg — ABNORMAL LOW (ref 80.0–100.0)
pO2, Arterial: 112 mmHg — ABNORMAL HIGH (ref 80.0–100.0)
pO2, Arterial: 104 mmHg — ABNORMAL HIGH (ref 80.0–100.0)

## 2015-10-09 LAB — POCT I-STAT, CHEM 8
BUN: 13 mg/dL (ref 6–20)
BUN: 13 mg/dL (ref 6–20)
BUN: 10 mg/dL (ref 6–20)
BUN: 12 mg/dL (ref 6–20)
BUN: 11 mg/dL (ref 6–20)
BUN: 11 mg/dL (ref 6–20)
BUN: 11 mg/dL (ref 6–20)
BUN: 10 mg/dL (ref 6–20)
Calcium, Ion: 1.32 mmol/L — ABNORMAL HIGH (ref 1.12–1.23)
Calcium, Ion: 1.23 mmol/L (ref 1.12–1.23)
Calcium, Ion: 0.89 mmol/L — ABNORMAL LOW (ref 1.12–1.23)
Calcium, Ion: 0.98 mmol/L — ABNORMAL LOW (ref 1.12–1.23)
Calcium, Ion: 0.93 mmol/L — ABNORMAL LOW (ref 1.12–1.23)
Calcium, Ion: 0.86 mmol/L — ABNORMAL LOW (ref 1.12–1.23)
Calcium, Ion: 0.94 mmol/L — ABNORMAL LOW (ref 1.12–1.23)
Calcium, Ion: 1.25 mmol/L — ABNORMAL HIGH (ref 1.12–1.23)
Chloride: 103 mmol/L (ref 101–111)
Chloride: 102 mmol/L (ref 101–111)
Chloride: 96 mmol/L — ABNORMAL LOW (ref 101–111)
Chloride: 102 mmol/L (ref 101–111)
Chloride: 105 mmol/L (ref 101–111)
Chloride: 104 mmol/L (ref 101–111)
Chloride: 111 mmol/L (ref 101–111)
Chloride: 111 mmol/L (ref 101–111)
Creatinine, Ser: 0.5 mg/dL — ABNORMAL LOW (ref 0.61–1.24)
Creatinine, Ser: 0.5 mg/dL — ABNORMAL LOW (ref 0.61–1.24)
Creatinine, Ser: 0.4 mg/dL — ABNORMAL LOW (ref 0.61–1.24)
Creatinine, Ser: 0.6 mg/dL — ABNORMAL LOW (ref 0.61–1.24)
Creatinine, Ser: 0.4 mg/dL — ABNORMAL LOW (ref 0.61–1.24)
Creatinine, Ser: 0.4 mg/dL — ABNORMAL LOW (ref 0.61–1.24)
Creatinine, Ser: 0.5 mg/dL — ABNORMAL LOW (ref 0.61–1.24)
Creatinine, Ser: 0.6 mg/dL — ABNORMAL LOW (ref 0.61–1.24)
Glucose, Bld: 159 mg/dL — ABNORMAL HIGH (ref 65–99)
Glucose, Bld: 169 mg/dL — ABNORMAL HIGH (ref 65–99)
Glucose, Bld: 179 mg/dL — ABNORMAL HIGH (ref 65–99)
Glucose, Bld: 164 mg/dL — ABNORMAL HIGH (ref 65–99)
Glucose, Bld: 176 mg/dL — ABNORMAL HIGH (ref 65–99)
Glucose, Bld: 189 mg/dL — ABNORMAL HIGH (ref 65–99)
Glucose, Bld: 192 mg/dL — ABNORMAL HIGH (ref 65–99)
Glucose, Bld: 160 mg/dL — ABNORMAL HIGH (ref 65–99)
HCT: 35 % — ABNORMAL LOW (ref 39.0–52.0)
HCT: 34 % — ABNORMAL LOW (ref 39.0–52.0)
HCT: 26 % — ABNORMAL LOW (ref 39.0–52.0)
HCT: 23 % — ABNORMAL LOW (ref 39.0–52.0)
HCT: 24 % — ABNORMAL LOW (ref 39.0–52.0)
HCT: 27 % — ABNORMAL LOW (ref 39.0–52.0)
HCT: 30 % — ABNORMAL LOW (ref 39.0–52.0)
HCT: 28 % — ABNORMAL LOW (ref 39.0–52.0)
Hemoglobin: 11.9 g/dL — ABNORMAL LOW (ref 13.0–17.0)
Hemoglobin: 11.6 g/dL — ABNORMAL LOW (ref 13.0–17.0)
Hemoglobin: 8.8 g/dL — ABNORMAL LOW (ref 13.0–17.0)
Hemoglobin: 7.8 g/dL — ABNORMAL LOW (ref 13.0–17.0)
Hemoglobin: 8.2 g/dL — ABNORMAL LOW (ref 13.0–17.0)
Hemoglobin: 10.2 g/dL — ABNORMAL LOW (ref 13.0–17.0)
Hemoglobin: 9.2 g/dL — ABNORMAL LOW (ref 13.0–17.0)
Hemoglobin: 9.5 g/dL — ABNORMAL LOW (ref 13.0–17.0)
Potassium: 3.5 mmol/L (ref 3.5–5.1)
Potassium: 3.7 mmol/L (ref 3.5–5.1)
Potassium: 3.6 mmol/L (ref 3.5–5.1)
Potassium: 4.4 mmol/L (ref 3.5–5.1)
Potassium: 4 mmol/L (ref 3.5–5.1)
Potassium: 3.8 mmol/L (ref 3.5–5.1)
Potassium: 4.9 mmol/L (ref 3.5–5.1)
Potassium: 4.1 mmol/L (ref 3.5–5.1)
Sodium: 141 mmol/L (ref 135–145)
Sodium: 140 mmol/L (ref 135–145)
Sodium: 142 mmol/L (ref 135–145)
Sodium: 136 mmol/L (ref 135–145)
Sodium: 136 mmol/L (ref 135–145)
Sodium: 133 mmol/L — ABNORMAL LOW (ref 135–145)
Sodium: 140 mmol/L (ref 135–145)
Sodium: 139 mmol/L (ref 135–145)
TCO2: 28 mmol/L (ref 0–100)
TCO2: 26 mmol/L (ref 0–100)
TCO2: 32 mmol/L (ref 0–100)
TCO2: 32 mmol/L (ref 0–100)
TCO2: 31 mmol/L (ref 0–100)
TCO2: 25 mmol/L (ref 0–100)
TCO2: 29 mmol/L (ref 0–100)
TCO2: 23 mmol/L (ref 0–100)

## 2015-10-09 LAB — CBC
HCT: 35.2 % — ABNORMAL LOW (ref 39.0–52.0)
HCT: 31.2 % — ABNORMAL LOW (ref 39.0–52.0)
Hemoglobin: 12.3 g/dL — ABNORMAL LOW (ref 13.0–17.0)
Hemoglobin: 10.9 g/dL — ABNORMAL LOW (ref 13.0–17.0)
MCH: 29.6 pg (ref 26.0–34.0)
MCH: 29.6 pg (ref 26.0–34.0)
MCHC: 34.9 g/dL (ref 30.0–36.0)
MCHC: 34.9 g/dL (ref 30.0–36.0)
MCV: 84.8 fL (ref 78.0–100.0)
MCV: 84.8 fL (ref 78.0–100.0)
Platelets: 145 10*3/uL — ABNORMAL LOW (ref 150–400)
Platelets: 139 10*3/uL — ABNORMAL LOW (ref 150–400)
RBC: 4.15 MIL/uL — ABNORMAL LOW (ref 4.22–5.81)
RBC: 3.68 MIL/uL — ABNORMAL LOW (ref 4.22–5.81)
RDW: 12.8 % (ref 11.5–15.5)
RDW: 12.9 % (ref 11.5–15.5)
WBC: 13.2 10*3/uL — ABNORMAL HIGH (ref 4.0–10.5)
WBC: 13.7 10*3/uL — ABNORMAL HIGH (ref 4.0–10.5)

## 2015-10-09 LAB — HEMOGLOBIN AND HEMATOCRIT, BLOOD
HCT: 30.4 % — ABNORMAL LOW (ref 39.0–52.0)
Hemoglobin: 11 g/dL — ABNORMAL LOW (ref 13.0–17.0)

## 2015-10-09 LAB — POCT I-STAT 4, (NA,K, GLUC, HGB,HCT)
Glucose, Bld: 137 mg/dL — ABNORMAL HIGH (ref 65–99)
HCT: 33 % — ABNORMAL LOW (ref 39.0–52.0)
Hemoglobin: 11.2 g/dL — ABNORMAL LOW (ref 13.0–17.0)
Potassium: 3.3 mmol/L — ABNORMAL LOW (ref 3.5–5.1)
Sodium: 144 mmol/L (ref 135–145)

## 2015-10-09 LAB — GLUCOSE, CAPILLARY
Glucose-Capillary: 172 mg/dL — ABNORMAL HIGH (ref 65–99)
Glucose-Capillary: 107 mg/dL — ABNORMAL HIGH (ref 65–99)
Glucose-Capillary: 114 mg/dL — ABNORMAL HIGH (ref 65–99)
Glucose-Capillary: 106 mg/dL — ABNORMAL HIGH (ref 65–99)
Glucose-Capillary: 117 mg/dL — ABNORMAL HIGH (ref 65–99)
Glucose-Capillary: 115 mg/dL — ABNORMAL HIGH (ref 65–99)
Glucose-Capillary: 148 mg/dL — ABNORMAL HIGH (ref 65–99)

## 2015-10-09 LAB — PROTIME-INR
INR: 1.55 — ABNORMAL HIGH (ref 0.00–1.49)
Prothrombin Time: 18.6 seconds — ABNORMAL HIGH (ref 11.6–15.2)

## 2015-10-09 LAB — CREATININE, SERUM
Creatinine, Ser: 0.82 mg/dL (ref 0.61–1.24)
GFR calc Af Amer: >60 mL/min (ref >60)
GFR calc non Af Amer: >60 mL/min (ref >60)

## 2015-10-09 LAB — PREPARE RBC (CROSSMATCH)

## 2015-10-09 LAB — MAGNESIUM: Magnesium: 2.6 mg/dL — ABNORMAL HIGH (ref 1.7–2.4)

## 2015-10-09 LAB — APTT: aPTT: 34 seconds (ref 24–37)

## 2015-10-09 LAB — PLATELET COUNT: Platelets: 131 10*3/uL — ABNORMAL LOW (ref 150–400)

## 2015-10-09 SURGERY — BENTALL PROCEDURE
Anesthesia: General | Site: Chest

## 2015-10-09 MED ORDER — PANTOPRAZOLE SODIUM 40 MG PO TBEC
40.0000 mg | DELAYED_RELEASE_TABLET | Freq: Every day | ORAL | Status: DC
  Administered 2015-10-11: 40 mg via ORAL
  Filled 2015-10-09: qty 1

## 2015-10-09 MED ORDER — LACTATED RINGERS IV SOLN
INTRAVENOUS | Status: DC
  Administered 2015-10-09: 15:00:00 via INTRAVENOUS

## 2015-10-09 MED ORDER — ASPIRIN 81 MG PO CHEW: 324.0000 mg | CHEWABLE_TABLET | Freq: Every day | ORAL | Status: DC

## 2015-10-09 MED ORDER — SODIUM CHLORIDE 0.45 % IV SOLN
INTRAVENOUS | Status: DC | PRN
  Administered 2015-10-09: 15:00:00 via INTRAVENOUS

## 2015-10-09 MED ORDER — NOREPINEPHRINE BITARTRATE 1 MG/ML IV SOLN
0.0000 ug/min | INTRAVENOUS | Status: DC
  Filled 2015-10-09: qty 4

## 2015-10-09 MED ORDER — VECURONIUM BROMIDE 10 MG IV SOLR
INTRAVENOUS | Status: DC | PRN
  Administered 2015-10-09: 10 mg via INTRAVENOUS
  Administered 2015-10-09: 5 mg via INTRAVENOUS
  Administered 2015-10-09: 10 mg via INTRAVENOUS
  Administered 2015-10-09 (×3): 5 mg via INTRAVENOUS

## 2015-10-09 MED ORDER — ANTISEPTIC ORAL RINSE SOLUTION (CORINZ)
7.0000 mL | Freq: Four times a day (QID) | OROMUCOSAL | Status: DC
  Administered 2015-10-09 – 2015-10-10 (×4): 7 mL via OROMUCOSAL

## 2015-10-09 MED ORDER — SODIUM CHLORIDE 0.9 % IJ SOLN
INTRAMUSCULAR | Status: AC
  Filled 2015-10-09: qty 10

## 2015-10-09 MED ORDER — ETOMIDATE 2 MG/ML IV SOLN
INTRAVENOUS | Status: DC | PRN
  Administered 2015-10-09: 20 mg via INTRAVENOUS

## 2015-10-09 MED ORDER — ACETAMINOPHEN 160 MG/5ML PO SOLN: 650.0000 mg | Freq: Once | ORAL | Status: AC

## 2015-10-09 MED ORDER — PROPOFOL 10 MG/ML IV BOLUS
INTRAVENOUS | Status: DC | PRN
  Administered 2015-10-09: 50 mg via INTRAVENOUS

## 2015-10-09 MED ORDER — DOCUSATE SODIUM 100 MG PO CAPS
200.0000 mg | ORAL_CAPSULE | Freq: Every day | ORAL | Status: DC
  Administered 2015-10-10 – 2015-10-11 (×2): 200 mg via ORAL
  Filled 2015-10-09 (×2): qty 2

## 2015-10-09 MED ORDER — CHLORHEXIDINE GLUCONATE 0.12% ORAL RINSE (MEDLINE KIT)
15.0000 mL | Freq: Two times a day (BID) | OROMUCOSAL | Status: DC
  Administered 2015-10-09 – 2015-10-10 (×2): 15 mL via OROMUCOSAL

## 2015-10-09 MED ORDER — PROTAMINE SULFATE 10 MG/ML IV SOLN
INTRAVENOUS | Status: DC | PRN
  Administered 2015-10-09: 270 mg via INTRAVENOUS

## 2015-10-09 MED ORDER — ATORVASTATIN CALCIUM 80 MG PO TABS
80.0000 mg | ORAL_TABLET | Freq: Every day | ORAL | Status: DC
  Administered 2015-10-10 – 2015-10-16 (×7): 80 mg via ORAL
  Filled 2015-10-09 (×7): qty 1

## 2015-10-09 MED ORDER — THROMBIN 20000 UNITS EX SOLR
OROMUCOSAL | Status: DC | PRN
  Administered 2015-10-09 (×3): 4 mL via TOPICAL

## 2015-10-09 MED ORDER — DEXTROSE 5 % IV SOLN
1.5000 g | Freq: Two times a day (BID) | INTRAVENOUS | Status: AC
  Administered 2015-10-09 – 2015-10-11 (×4): 1.5 g via INTRAVENOUS
  Filled 2015-10-09 (×4): qty 1.5

## 2015-10-09 MED ORDER — ALBUTEROL SULFATE HFA 108 (90 BASE) MCG/ACT IN AERS
INHALATION_SPRAY | RESPIRATORY_TRACT | Status: AC
  Filled 2015-10-09: qty 6.7

## 2015-10-09 MED ORDER — ALBUMIN HUMAN 5 % IV SOLN
250.0000 mL | INTRAVENOUS | Status: AC | PRN
  Administered 2015-10-09 (×4): 250 mL via INTRAVENOUS
  Filled 2015-10-09: qty 250

## 2015-10-09 MED ORDER — METHYLPREDNISOLONE SODIUM SUCC 125 MG IJ SOLR
INTRAMUSCULAR | Status: AC
  Filled 2015-10-09: qty 2

## 2015-10-09 MED ORDER — CHLORHEXIDINE GLUCONATE CLOTH 2 % EX PADS
6.0000 | MEDICATED_PAD | Freq: Every day | CUTANEOUS | Status: AC
  Administered 2015-10-09 – 2015-10-12 (×4): 6 via TOPICAL

## 2015-10-09 MED ORDER — LACTATED RINGERS IV SOLN: 500.0000 mL | Freq: Once | INTRAVENOUS | Status: DC | PRN

## 2015-10-09 MED ORDER — METOPROLOL TARTRATE 1 MG/ML IV SOLN: 2.5000 mg | INTRAVENOUS | Status: DC | PRN

## 2015-10-09 MED ORDER — MIDAZOLAM HCL 2 MG/2ML IJ SOLN: 2.0000 mg | INTRAMUSCULAR | Status: DC | PRN

## 2015-10-09 MED ORDER — FENTANYL CITRATE (PF) 250 MCG/5ML IJ SOLN
INTRAMUSCULAR | Status: AC
  Filled 2015-10-09: qty 30

## 2015-10-09 MED ORDER — ACETAMINOPHEN 160 MG/5ML PO SOLN: 1000.0000 mg | Freq: Four times a day (QID) | ORAL | Status: DC

## 2015-10-09 MED ORDER — LIDOCAINE HCL (CARDIAC) 20 MG/ML IV SOLN
INTRAVENOUS | Status: AC
  Filled 2015-10-09: qty 5

## 2015-10-09 MED ORDER — SODIUM BICARBONATE 8.4 % IV SOLN
50.0000 meq | Freq: Once | INTRAVENOUS | Status: AC
  Administered 2015-10-09: 50 meq via INTRAVENOUS

## 2015-10-09 MED ORDER — ROCURONIUM BROMIDE 100 MG/10ML IV SOLN
INTRAVENOUS | Status: DC | PRN
  Administered 2015-10-09: 50 mg via INTRAVENOUS

## 2015-10-09 MED ORDER — TRAMADOL HCL 50 MG PO TABS: 50.0000 mg | ORAL_TABLET | ORAL | Status: DC | PRN

## 2015-10-09 MED ORDER — HEPARIN SODIUM (PORCINE) 1000 UNIT/ML IJ SOLN
INTRAMUSCULAR | Status: DC | PRN
  Administered 2015-10-09: 27000 [IU] via INTRAVENOUS

## 2015-10-09 MED ORDER — ROCURONIUM BROMIDE 50 MG/5ML IV SOLN
INTRAVENOUS | Status: AC
  Filled 2015-10-09: qty 1

## 2015-10-09 MED ORDER — SODIUM CHLORIDE 0.9 % IV SOLN: INTRAVENOUS | Status: DC

## 2015-10-09 MED ORDER — FENTANYL CITRATE (PF) 100 MCG/2ML IJ SOLN
INTRAMUSCULAR | Status: DC | PRN
  Administered 2015-10-09: 250 ug via INTRAVENOUS
  Administered 2015-10-09: 200 ug via INTRAVENOUS
  Administered 2015-10-09: 50 ug via INTRAVENOUS
  Administered 2015-10-09: 250 ug via INTRAVENOUS
  Administered 2015-10-09: 100 ug via INTRAVENOUS
  Administered 2015-10-09: 250 ug via INTRAVENOUS
  Administered 2015-10-09: 150 ug via INTRAVENOUS
  Administered 2015-10-09 (×2): 500 ug via INTRAVENOUS

## 2015-10-09 MED ORDER — VECURONIUM BROMIDE 10 MG IV SOLR
INTRAVENOUS | Status: AC
  Filled 2015-10-09: qty 30

## 2015-10-09 MED ORDER — NITROGLYCERIN IN D5W 200-5 MCG/ML-% IV SOLN: 0.0000 ug/min | INTRAVENOUS | Status: DC

## 2015-10-09 MED ORDER — METOPROLOL TARTRATE 12.5 MG HALF TABLET: 12.5000 mg | ORAL_TABLET | Freq: Two times a day (BID) | ORAL | Status: DC

## 2015-10-09 MED ORDER — CHLORHEXIDINE GLUCONATE 0.12 % MT SOLN
15.0000 mL | OROMUCOSAL | Status: AC
  Administered 2015-10-09: 15 mL via OROMUCOSAL

## 2015-10-09 MED ORDER — MIDAZOLAM HCL 5 MG/5ML IJ SOLN
INTRAMUSCULAR | Status: DC | PRN
  Administered 2015-10-09: 3 mg via INTRAVENOUS
  Administered 2015-10-09: 2 mg via INTRAVENOUS
  Administered 2015-10-09: 3 mg via INTRAVENOUS
  Administered 2015-10-09 (×2): 5 mg via INTRAVENOUS
  Administered 2015-10-09: 2 mg via INTRAVENOUS

## 2015-10-09 MED ORDER — DEXMEDETOMIDINE HCL IN NACL 400 MCG/100ML IV SOLN
0.4000 ug/kg/h | INTRAVENOUS | Status: DC
  Filled 2015-10-09: qty 100

## 2015-10-09 MED ORDER — SODIUM CHLORIDE 0.9% FLUSH
3.0000 mL | Freq: Two times a day (BID) | INTRAVENOUS | Status: DC
  Administered 2015-10-10 – 2015-10-11 (×2): 3 mL via INTRAVENOUS

## 2015-10-09 MED ORDER — BISACODYL 5 MG PO TBEC
10.0000 mg | DELAYED_RELEASE_TABLET | Freq: Every day | ORAL | Status: DC
  Administered 2015-10-10: 10 mg via ORAL
  Filled 2015-10-09: qty 2

## 2015-10-09 MED ORDER — MUPIROCIN 2 % EX OINT
1.0000 "application " | TOPICAL_OINTMENT | Freq: Two times a day (BID) | CUTANEOUS | Status: AC
  Administered 2015-10-09 – 2015-10-13 (×10): 1 via NASAL
  Filled 2015-10-09: qty 22

## 2015-10-09 MED ORDER — ACETAMINOPHEN 500 MG PO TABS
1000.0000 mg | ORAL_TABLET | Freq: Four times a day (QID) | ORAL | Status: DC
  Administered 2015-10-09 – 2015-10-11 (×7): 1000 mg via ORAL
  Filled 2015-10-09 (×7): qty 2

## 2015-10-09 MED ORDER — SODIUM CHLORIDE 0.9 % IV SOLN: 250.0000 mL | INTRAVENOUS | Status: DC

## 2015-10-09 MED ORDER — METHYLPREDNISOLONE SODIUM SUCC 125 MG IJ SOLR
INTRAMUSCULAR | Status: DC | PRN
  Administered 2015-10-09: 125 mg via INTRAVENOUS

## 2015-10-09 MED ORDER — ACETAMINOPHEN 650 MG RE SUPP
650.0000 mg | Freq: Once | RECTAL | Status: AC
  Administered 2015-10-09: 650 mg via RECTAL

## 2015-10-09 MED ORDER — VANCOMYCIN HCL IN DEXTROSE 1-5 GM/200ML-% IV SOLN
1000.0000 mg | Freq: Once | INTRAVENOUS | Status: AC
  Administered 2015-10-09: 1000 mg via INTRAVENOUS
  Filled 2015-10-09: qty 200

## 2015-10-09 MED ORDER — SODIUM CHLORIDE 0.9% FLUSH
3.0000 mL | INTRAVENOUS | Status: DC | PRN
  Administered 2015-10-10 (×2): 3 mL via INTRAVENOUS
  Filled 2015-10-09 (×2): qty 3

## 2015-10-09 MED ORDER — PROPOFOL 10 MG/ML IV BOLUS
INTRAVENOUS | Status: AC
  Filled 2015-10-09: qty 20

## 2015-10-09 MED ORDER — SODIUM CHLORIDE 0.9 % IV SOLN
INTRAVENOUS | Status: DC | PRN
  Administered 2015-10-09: 08:00:00 via INTRAVENOUS

## 2015-10-09 MED ORDER — ASPIRIN EC 325 MG PO TBEC
325.0000 mg | DELAYED_RELEASE_TABLET | Freq: Every day | ORAL | Status: DC
  Administered 2015-10-10: 325 mg via ORAL
  Filled 2015-10-09: qty 1

## 2015-10-09 MED ORDER — HEMOSTATIC AGENTS (NO CHARGE) OPTIME
TOPICAL | Status: DC | PRN
  Administered 2015-10-09 (×2): 1 via TOPICAL

## 2015-10-09 MED ORDER — THROMBIN 20000 UNITS EX SOLR
CUTANEOUS | Status: DC | PRN
  Administered 2015-10-09: 20000 [IU] via TOPICAL

## 2015-10-09 MED ORDER — ONDANSETRON HCL 4 MG/2ML IJ SOLN
4.0000 mg | Freq: Four times a day (QID) | INTRAMUSCULAR | Status: DC | PRN
  Administered 2015-10-09: 4 mg via INTRAVENOUS
  Filled 2015-10-09: qty 2

## 2015-10-09 MED ORDER — FENTANYL CITRATE (PF) 250 MCG/5ML IJ SOLN
INTRAMUSCULAR | Status: AC
  Filled 2015-10-09: qty 15

## 2015-10-09 MED ORDER — MIDAZOLAM HCL 10 MG/2ML IJ SOLN
INTRAMUSCULAR | Status: AC
  Filled 2015-10-09: qty 2

## 2015-10-09 MED ORDER — HEPARIN SODIUM (PORCINE) 1000 UNIT/ML IJ SOLN
INTRAMUSCULAR | Status: AC
  Filled 2015-10-09: qty 1

## 2015-10-09 MED ORDER — BISACODYL 10 MG RE SUPP: 10.0000 mg | Freq: Every day | RECTAL | Status: DC

## 2015-10-09 MED ORDER — POTASSIUM CHLORIDE 10 MEQ/50ML IV SOLN
10.0000 meq | INTRAVENOUS | Status: AC
  Administered 2015-10-09 (×2): 10 meq via INTRAVENOUS

## 2015-10-09 MED ORDER — MORPHINE SULFATE (PF) 2 MG/ML IV SOLN
2.0000 mg | INTRAVENOUS | Status: DC | PRN
  Administered 2015-10-10 – 2015-10-11 (×6): 2 mg via INTRAVENOUS
  Filled 2015-10-09: qty 2
  Filled 2015-10-09 (×5): qty 1

## 2015-10-09 MED ORDER — MAGNESIUM SULFATE 4 GM/100ML IV SOLN
4.0000 g | Freq: Once | INTRAVENOUS | Status: AC
  Administered 2015-10-09: 4 g via INTRAVENOUS
  Filled 2015-10-09: qty 100

## 2015-10-09 MED ORDER — ARTIFICIAL TEARS OP OINT
TOPICAL_OINTMENT | OPHTHALMIC | Status: AC
  Filled 2015-10-09: qty 3.5

## 2015-10-09 MED ORDER — INSULIN REGULAR BOLUS VIA INFUSION
0.0000 [IU] | Freq: Three times a day (TID) | INTRAVENOUS | Status: DC
  Administered 2015-10-10: 3.9 [IU] via INTRAVENOUS
  Filled 2015-10-09: qty 10

## 2015-10-09 MED ORDER — OXYCODONE HCL 5 MG PO TABS
5.0000 mg | ORAL_TABLET | ORAL | Status: DC | PRN
  Administered 2015-10-10 – 2015-10-11 (×5): 10 mg via ORAL
  Filled 2015-10-09 (×5): qty 2

## 2015-10-09 MED ORDER — STERILE WATER FOR INJECTION IJ SOLN
INTRAMUSCULAR | Status: AC
  Filled 2015-10-09: qty 20

## 2015-10-09 MED ORDER — PHENYLEPHRINE HCL 10 MG/ML IJ SOLN
INTRAMUSCULAR | Status: DC | PRN
  Administered 2015-10-09 (×2): 40 ug via INTRAVENOUS

## 2015-10-09 MED ORDER — DEXMEDETOMIDINE HCL IN NACL 200 MCG/50ML IV SOLN: 0.0000 ug/kg/h | INTRAVENOUS | Status: DC

## 2015-10-09 MED ORDER — 0.9 % SODIUM CHLORIDE (POUR BTL) OPTIME
TOPICAL | Status: DC | PRN
  Administered 2015-10-09: 5000 mL

## 2015-10-09 MED ORDER — METOPROLOL TARTRATE 25 MG/10 ML ORAL SUSPENSION: 12.5000 mg | Freq: Two times a day (BID) | ORAL | Status: DC

## 2015-10-09 MED ORDER — FENOFIBRATE 160 MG PO TABS
160.0000 mg | ORAL_TABLET | Freq: Every day | ORAL | Status: DC
  Administered 2015-10-10 – 2015-10-17 (×8): 160 mg via ORAL
  Filled 2015-10-09 (×8): qty 1

## 2015-10-09 MED ORDER — SODIUM CHLORIDE 0.9 % IV SOLN
INTRAVENOUS | Status: DC
  Administered 2015-10-09: 5 [IU]/h via INTRAVENOUS
  Filled 2015-10-09: qty 2.5

## 2015-10-09 MED ORDER — ARTIFICIAL TEARS OP OINT
TOPICAL_OINTMENT | OPHTHALMIC | Status: DC | PRN
  Administered 2015-10-09: 1 via OPHTHALMIC

## 2015-10-09 MED ORDER — LACTATED RINGERS IV SOLN
INTRAVENOUS | Status: DC | PRN
  Administered 2015-10-09 (×2): via INTRAVENOUS

## 2015-10-09 MED ORDER — FAMOTIDINE IN NACL 20-0.9 MG/50ML-% IV SOLN
20.0000 mg | Freq: Two times a day (BID) | INTRAVENOUS | Status: AC
  Administered 2015-10-09: 20 mg via INTRAVENOUS

## 2015-10-09 MED ORDER — ALBUMIN HUMAN 5 % IV SOLN
INTRAVENOUS | Status: DC | PRN
  Administered 2015-10-09 (×2): via INTRAVENOUS

## 2015-10-09 MED ORDER — MORPHINE SULFATE (PF) 2 MG/ML IV SOLN: 1.0000 mg | INTRAVENOUS | Status: AC | PRN

## 2015-10-09 MED ORDER — ETOMIDATE 2 MG/ML IV SOLN
INTRAVENOUS | Status: AC
  Filled 2015-10-09: qty 10

## 2015-10-09 MED ORDER — PHENYLEPHRINE HCL 10 MG/ML IJ SOLN
0.0000 ug/min | INTRAVENOUS | Status: DC
  Administered 2015-10-09: 5 ug/min via INTRAVENOUS
  Filled 2015-10-09: qty 2

## 2015-10-09 MED ORDER — POTASSIUM CHLORIDE 10 MEQ/50ML IV SOLN
10.0000 meq | INTRAVENOUS | Status: AC
  Administered 2015-10-09 (×3): 10 meq via INTRAVENOUS

## 2015-10-09 MED FILL — Magnesium Sulfate Inj 50%: INTRAMUSCULAR | Qty: 10 | Status: AC

## 2015-10-09 MED FILL — Potassium Chloride Inj 2 mEq/ML: INTRAVENOUS | Qty: 40 | Status: AC

## 2015-10-09 MED FILL — Heparin Sodium (Porcine) Inj 1000 Unit/ML: INTRAMUSCULAR | Qty: 30 | Status: AC

## 2015-10-09 SURGICAL SUPPLY — 85 items
ADAPTER CARDIO PERF ANTE/RETRO (ADAPTER) ×6 IMPLANT
ADAPTER DLP PERFUSION .25INX2I (MISCELLANEOUS) ×3 IMPLANT
APPLICATOR TIP COSEAL (VASCULAR PRODUCTS) ×12 IMPLANT
ATTRACTOMAT 16X20 MAGNETIC DRP (DRAPES) ×3 IMPLANT
BAG DECANTER FOR FLEXI CONT (MISCELLANEOUS) ×3 IMPLANT
BLADE STERNUM SYSTEM 6 (BLADE) ×3 IMPLANT
BLADE SURG 15 STRL LF DISP TIS (BLADE) ×4 IMPLANT
BLADE SURG 15 STRL SS (BLADE) ×2
CANISTER SUCTION 2500CC (MISCELLANEOUS) ×3 IMPLANT
CANNULA GUNDRY RCSP 15FR (MISCELLANEOUS) ×6 IMPLANT
CATH HEART VENT LEFT (CATHETERS) ×2 IMPLANT
CATH ROBINSON RED A/P 18FR (CATHETERS) ×9 IMPLANT
CATH THORACIC 36FR (CATHETERS) ×3 IMPLANT
CATH THORACIC 36FR RT ANG (CATHETERS) ×3 IMPLANT
CAUTERY HIGH TEMP VAS (MISCELLANEOUS) ×3 IMPLANT
CONT SPEC STER OR (MISCELLANEOUS) ×3 IMPLANT
CONT SPECI 4OZ STER CLIK (MISCELLANEOUS) ×6 IMPLANT
COVER SURGICAL LIGHT HANDLE (MISCELLANEOUS) ×6 IMPLANT
CRADLE DONUT ADULT HEAD (MISCELLANEOUS) ×3 IMPLANT
DRAPE SLUSH/WARMER DISC (DRAPES) ×3 IMPLANT
DRSG COVADERM 4X14 (GAUZE/BANDAGES/DRESSINGS) ×3 IMPLANT
ELECT BLADE 4.0 EZ CLEAN MEGAD (MISCELLANEOUS) ×3
ELECT CAUTERY BLADE 6.4 (BLADE) ×3 IMPLANT
ELECT REM PT RETURN 9FT ADLT (ELECTROSURGICAL) ×6
ELECTRODE BLDE 4.0 EZ CLN MEGD (MISCELLANEOUS) ×2 IMPLANT
ELECTRODE REM PT RTRN 9FT ADLT (ELECTROSURGICAL) ×4 IMPLANT
GAUZE SPONGE 4X4 12PLY STRL (GAUZE/BANDAGES/DRESSINGS) ×3 IMPLANT
GLOVE BIO SURGEON STRL SZ 6.5 (GLOVE) ×24 IMPLANT
GLOVE BIO SURGEON STRL SZ7 (GLOVE) ×12 IMPLANT
GLOVE BIOGEL PI IND STRL 6.5 (GLOVE) ×12 IMPLANT
GLOVE BIOGEL PI IND STRL 7.0 (GLOVE) ×4 IMPLANT
GLOVE BIOGEL PI INDICATOR 6.5 (GLOVE) ×6
GLOVE BIOGEL PI INDICATOR 7.0 (GLOVE) ×2
GLOVE EUDERMIC 7 POWDERFREE (GLOVE) ×6 IMPLANT
GOWN STRL REUS W/ TWL LRG LVL3 (GOWN DISPOSABLE) ×20 IMPLANT
GOWN STRL REUS W/ TWL XL LVL3 (GOWN DISPOSABLE) ×2 IMPLANT
GOWN STRL REUS W/TWL LRG LVL3 (GOWN DISPOSABLE) ×10
GOWN STRL REUS W/TWL XL LVL3 (GOWN DISPOSABLE) ×1
GRAFT HEMASHIELD 30X10 (Vascular Products) ×3 IMPLANT
HEART VENT LT CURVED (MISCELLANEOUS) ×3 IMPLANT
HEMOSTAT POWDER SURGIFOAM 1G (HEMOSTASIS) ×9 IMPLANT
HEMOSTAT SURGICEL 2X14 (HEMOSTASIS) ×3 IMPLANT
INSERT FOGARTY XLG (MISCELLANEOUS) ×3 IMPLANT
KIT BASIN OR (CUSTOM PROCEDURE TRAY) ×3 IMPLANT
KIT CATH CPB BARTLE (MISCELLANEOUS) ×3 IMPLANT
KIT ROOM TURNOVER OR (KITS) ×3 IMPLANT
KIT SUCTION CATH 14FR (SUCTIONS) ×3 IMPLANT
LINE VENT (MISCELLANEOUS) ×3 IMPLANT
LOOP VESSEL SUPERMAXI WHITE (MISCELLANEOUS) ×3 IMPLANT
NS IRRIG 1000ML POUR BTL (IV SOLUTION) ×15 IMPLANT
PACK OPEN HEART (CUSTOM PROCEDURE TRAY) ×3 IMPLANT
PAD ARMBOARD 7.5X6 YLW CONV (MISCELLANEOUS) ×6 IMPLANT
SEALANT SURG COSEAL 8ML (VASCULAR PRODUCTS) ×6 IMPLANT
SET CARDIOPLEGIA MPS 5001102 (MISCELLANEOUS) ×3 IMPLANT
SET VEIN GRAFT PERF (SET/KITS/TRAYS/PACK) ×3 IMPLANT
SPONGE GAUZE 4X4 12PLY STER LF (GAUZE/BANDAGES/DRESSINGS) ×3 IMPLANT
SPONGE LAP 18X18 X RAY DECT (DISPOSABLE) ×15 IMPLANT
SPONGE LAP 4X18 X RAY DECT (DISPOSABLE) ×6 IMPLANT
SUT BONE WAX W31G (SUTURE) ×3 IMPLANT
SUT ETHIBON 2 0 V 52N 30 (SUTURE) ×6 IMPLANT
SUT PROLENE 3 0 SH 1 (SUTURE) ×3 IMPLANT
SUT PROLENE 3 0 SH 48 (SUTURE) ×12 IMPLANT
SUT PROLENE 3 0 SH DA (SUTURE) ×3 IMPLANT
SUT PROLENE 4 0 RB 1 (SUTURE) ×9
SUT PROLENE 4-0 RB1 .5 CRCL 36 (SUTURE) ×18 IMPLANT
SUT PROLENE 5 0 RB 2 (SUTURE) ×6 IMPLANT
SUT SILK 2 0 SH CR/8 (SUTURE) ×6 IMPLANT
SUT STEEL SZ 6 DBL 3X14 BALL (SUTURE) ×9 IMPLANT
SUT VIC AB 1 CTX 36 (SUTURE) ×3
SUT VIC AB 1 CTX36XBRD ANBCTR (SUTURE) ×6 IMPLANT
SUT VIC AB 3-0 X1 27 (SUTURE) ×6 IMPLANT
SUTURE E-PAK OPEN HEART (SUTURE) ×3 IMPLANT
SYSTEM SAHARA CHEST DRAIN ATS (WOUND CARE) ×3 IMPLANT
TAPE CLOTH SURG 4X10 WHT LF (GAUZE/BANDAGES/DRESSINGS) ×3 IMPLANT
TAPE PAPER 3X10 WHT MICROPORE (GAUZE/BANDAGES/DRESSINGS) ×3 IMPLANT
TOWEL OR 17X24 6PK STRL BLUE (TOWEL DISPOSABLE) ×3 IMPLANT
TOWEL OR 17X26 10 PK STRL BLUE (TOWEL DISPOSABLE) ×3 IMPLANT
TRAY FOLEY IC TEMP SENS 14FR (CATHETERS) ×3 IMPLANT
UNDERPAD 30X30 INCONTINENT (UNDERPADS AND DIAPERS) ×3 IMPLANT
VALVE AOR 12X23MECH LO POR (Prosthesis & Implant Heart) ×2 IMPLANT
VALVE AORTIC COND (Prosthesis & Implant Heart) ×1 IMPLANT
VALVE ST JUDE AORTIC (Prosthesis & Implant Heart) IMPLANT
VALVE ST JUDE AORTIC 23 (Prosthesis & Implant Heart) IMPLANT
VENT LEFT HEART 12002 (CATHETERS) ×3
WATER STERILE IRR 1000ML POUR (IV SOLUTION) ×6 IMPLANT

## 2015-10-09 NOTE — Progress Notes (Addendum)
[facility]ENING ROUNDS NOTE :     [address]       [place] [address]             [phone]                 Day of Surgery Procedure(s) (LRB): BENTALL PROCEDURE (using a St Jude mechanical valve, size 23) (N/A) TRANSESOPHAGEAL ECHOCARDIOGRAM (TEE) (N/A)  Total Length of Stay:  LOS: 0 days  BP 99/72 mmHg  Pulse 80  Temp(Src) 96.6 F (35.9 C) (Oral)  Resp 19  Ht  (1.702 m)  Wt 190 lb (86.183 kg)  BMI 29.75 kg/m2  SpO2 99%  .Intake/Output      01/26 0701 - 01/27 0700   I.V. (mL/kg) 3208.2 (37.2)   Blood 312   IV Piggyback 2000   Total Intake(mL/kg) 5520.2 (64.1)   Urine (mL/kg/hr) 3870 (3.2)   Blood 600 (0.5)   Chest Tube 220 (0.2)   Total Output 4690   Net +830.2         . sodium chloride 20 mL/hr at 10/09/15 1900  . [START ON 10/10/2015] sodium chloride    . sodium chloride    . dexmedetomidine Stopped (10/09/15 1630)  . insulin (NOVOLIN-R) infusion 5 Units/hr (10/09/15 2055)  . lactated ringers Stopped (10/09/15 1900)  . lactated ringers 20 mL/hr at 10/09/15 1900  . nitroGLYCERIN    . phenylephrine (NEO-SYNEPHRINE) Adult infusion 5 mcg/min (10/09/15 2055)     Lab Results  Component Value Date   WBC 13.7* 10/09/2015   HGB 9.5* 10/09/2015   HCT 28.0* 10/09/2015   PLT 139* 10/09/2015   GLUCOSE 160* 10/09/2015   CHOL 107 11/05/2014   TRIG 182.0* 11/05/2014   HDL 28.20* 11/05/2014   LDLDIRECT 43 09/21/2014   LDLCALC 42 11/05/2014   ALT 34 10/07/2015   AST 23 10/07/2015   NA 139 10/09/2015   K 4.1 10/09/2015   CL 111 10/09/2015   CREATININE 0.60* 10/09/2015   BUN 10 10/09/2015   CO2 21* 10/07/2015   TSH 3.113 09/20/2014   INR 1.55* 10/09/2015   HGBA1C 8.9* 10/07/2015    Extubated uneventfully, denies pain Hemodynamically stable on Neo at 1mcg/min Maintaining NSR, not requiring back up pacing Minimal Chest Tube output  Rebacca Votaw PA-C

## 2015-10-09 NOTE — Anesthesia Procedure Notes (Signed)
Procedure Name: Intubation Date/Time: 10/09/2015 8:15 AM Performed by: Wray Kearns A Pre-anesthesia Checklist: Patient identified, Timeout performed, Emergency Drugs available, Suction available and Patient being monitored Patient Re-evaluated:Patient Re-evaluated prior to inductionOxygen Delivery Method: Circle system utilized Preoxygenation: Pre-oxygenation with 100% oxygen Intubation Type: IV induction and Cricoid Pressure applied Ventilation: Mask ventilation without difficulty and Oral airway inserted - appropriate to patient size Laryngoscope Size: Mac and 4 Grade View: Grade I Tube type: Oral Tube size: 8.0 mm Number of attempts: 1 Airway Equipment and Method: Stylet Placement Confirmation: ETT inserted through vocal cords under direct vision,  breath sounds checked- equal and bilateral and positive ETCO2 Secured at: 23 cm Tube secured with: Tape Dental Injury: Teeth and Oropharynx as per pre-operative assessment

## 2015-10-09 NOTE — Progress Notes (Signed)
Pt extubated at 2015 with RN and RT in the room, placed on 5 L [place], 500 on IS, VSS. Will continue to monitor.

## 2015-10-09 NOTE — Anesthesia Postprocedure Evaluation (Signed)
Anesthesia Post Note  Patient: Kerwin Augustus  Procedure(s) Performed: Procedure(s) (LRB): BENTALL PROCEDURE (using a St Jude mechanical valve, size 23) (N/A) TRANSESOPHAGEAL ECHOCARDIOGRAM (TEE) (N/A)  Patient location during evaluation: SICU Anesthesia Type: General Level of consciousness: sedated, patient cooperative and patient remains intubated per anesthesia plan Pain management: pain level controlled Vital Signs Assessment: post-procedure vital signs reviewed and stable Respiratory status: patient remains intubated per anesthesia plan Cardiovascular status: stable Anesthetic complications: no    Last Vitals:  Filed Vitals:   10/09/15 0604 10/09/15 1430  BP: 109/78 111/60  Pulse: 70 86  Temp: 36.3 C   Resp: 20 12    Last Pain: There were no vitals filed for this visit.               Lewie Loron

## 2015-10-09 NOTE — H&P (Signed)
[address]       [place] [address]             [phone]      Cardiothoracic Surgery Admission History and Physical    PCP is Bufford Spikes, NP Referring Provider is Marykay Lex, MD  Chief Complaint  Patient presents with  . Aortic Stenosis    Surgical eval, Cardiac Cath 09/20/14, 2D Echo 11/12/14    HPI:  The patient is a 55 year old gentleman with type 2 DM, hyperlipidemia, CAD s/p inferior STEMI and PCI/DES to a 99% OM2 09/2014, known bicuspid aortic valve stenosis. When he saw cardiology on 10/01/2014 he reported some mild exertional dyspnea with walks. About a month later he was evaluated for dizziness and near syncope that was similar to his prodromal symptoms at the time of his MI. He noted at least two episodes of dizziness and flushing with near syncope that occurred at rest. His last echo on 11/12/2014 showed severe AS with a peak velocity of 421 cm/sec corresponding to a valve area of 0.79 cm2. The ascending aorta was 46 mm. LVEF was 60-65%. Mean AV gradient was 41 mm Hg. DI was 0.2. Since then he has not had any further episodes of dizziness but continues to have exertional fatigue and shortness of breath. Since he was still on Effient post DES he was observed but was referred for surgical evaluation in July.  He does report occasional episodes of left chest dull aching that is not always related to exertion. He did have some enlargement of his aorta on cath and I felt that he would need AVR and probably replacement of his ascending aorta. He wanted to wait until after November so that he could get health insurance prior to surgery. He underwent R/L heart cath on 09/18/2015 which showed a widely patent LCx stent and mild luminal irregularities in the LAD and RCA. A follow up echo on 09/23/2015 showed a bicuspid aortic valve that was severely thickened and calcified with restricted leaflet mobility and a mean gradient of 42 mm Hg. The aortic  root was measured at 42 mm. A CT angio of the chest on 09/24/2015 showed a fusiform ascending aortic aneurysm with a maximum diameter of 4.6 cm. The descending aorta is 2.4 cm.    Past Medical History  Diagnosis Date  . Hyperlipidemia   . Diabetes mellitus type 2, controlled, with complications   . Bell's palsy   . Aortic stenosis due to bicuspid aortic valve     Probable bicuspid aortic valve: a. mod-sev by 2D ECHO 09/2014. AVA 0.9cm^2; b) 10/2014 Echo:. Severe AS Mn-Pk Gradient 41-71 mmHg, AVA ~0.79 cm2, EF 60-65%  . Hypertriglyceridemia   . CAD S/P percutaneous coronary angioplasty 09/20/2014    a. inferolat STEMI ->> LHC-Angio: Proximal/ostial LAD 20%, OM2 99% -->> PCI: 3mm x 16 mm Promus Premier DES to the OM2.(~3.5 mm)  . ST elevation myocardial infarction (STEMI) of inferior wall 09/20/2014    Past Surgical History  Procedure Laterality Date  . Cardiac catheterization      remote h/o cath >15 yrs ago, unkown  . Doppler echocardiography  January 2016, February2016    Probable bicuspid aortic valve: a. mod-sev by 2D ECHO 09/2014. AVA 0.9cm^2; b) 10/2014 Echo:. Severe AS Mn-Pk Gradient 41-71 mmHg, AVA ~0.79 cm2, EF 60-65%  . Left heart catheterization with coronary angiogram N/A 09/20/2014    Procedure: LEFT HEART CATHETERIZATION WITH CORONARY ANGIOGRAM; Surgeon:  Marykay Lex, MD; Location: [facility] CATH LAB; Service: Cardiovascular; Laterality: N/A;  . Percutaneous coronary stent intervention (pci-s)  09/20/2014    PTCI to OM 2 with Promus Premier DES 3.0 mm x 16 mm (3.5 mm)    Family History  Problem Relation Age of Onset  . CAD Neg Hx     per wife    Social History History  Substance Use Topics  . Smoking status: Never Smoker   . Smokeless tobacco: Not on file  . Alcohol Use: No    Current Outpatient Prescriptions  Medication Sig Dispense Refill  . aspirin EC 81 MG EC tablet  Take 1 tablet (81 mg total) by mouth daily.    Marland Kitchen atorvastatin (LIPITOR) 80 MG tablet Take 1 tablet (80 mg total) by mouth daily. (Patient taking differently: Take 40 mg by mouth daily. ) 30 tablet 11  . carvedilol (COREG) 3.125 MG tablet Take 1 tablet (3.125 mg total) by mouth 2 (two) times daily with a meal. 60 tablet 11  . fenofibrate 160 MG tablet Take 1 tablet (160 mg total) by mouth daily. 30 tablet 11  . glipiZIDE (GLUCOTROL XL) 5 MG 24 hr tablet Take 1 tablet (5 mg total) by mouth daily with breakfast. 30 tablet 11  . metFORMIN (GLUCOPHAGE) 1000 MG tablet Take 500 mg by mouth 2 (two) times daily with a meal.     . nitroGLYCERIN (NITROSTAT) 0.4 MG SL tablet Place 1 tablet (0.4 mg total) under the tongue every 5 (five) minutes x 3 doses as needed for chest pain. 25 tablet 12  . prasugrel (EFFIENT) 10 MG TABS tablet Take 1 tablet (10 mg total) by mouth daily. 30 tablet 11   No current facility-administered medications for this visit.    No Known Allergies  Review of Systems  Constitutional: Positive for fatigue.  HENT: Negative.  Eyes: Negative.  Respiratory: Positive for chest tightness and shortness of breath.  Cardiovascular: Positive for chest pain. Negative for palpitations and leg swelling.  Gastrointestinal: Negative.  Endocrine: Negative.  Genitourinary: Negative.  Musculoskeletal: Negative.  Skin: Negative.  Allergic/Immunologic: Negative.  Neurological: Positive for dizziness and light-headedness. Negative for syncope.  Hematological: Negative.  Psychiatric/Behavioral: Negative.     Physical Exam  BP 127/85 mmHg  Pulse 100  Resp 20  Ht 5' 7.5" (1.715 m)  Wt 196 lb (88.905 kg)  BMI 30.23 kg/m2  SpO2 98% Constitutional: He is oriented to person, place, and time. He appears well-developed and well-nourished. No distress.  HENT:  Head: Normocephalic and atraumatic.  Mouth/Throat: Oropharynx is clear and moist.    Eyes: EOM are normal. Pupils are equal, round, and reactive to light.  Neck: Normal range of motion. Neck supple. No JVD present. No thyromegaly present.  Cardiovascular: Normal rate, regular rhythm and intact distal pulses.  Murmur heard. 3/6 systolic murmur along RSB. No diastolic murmur.  Pulmonary/Chest: Effort normal and breath sounds normal. No respiratory distress. He has no rales.  Abdominal: Soft. Bowel sounds are normal. He exhibits no distension and no mass. There is no tenderness.  Musculoskeletal: Normal range of motion. He exhibits no edema or tenderness.  Lymphadenopathy:   He has no cervical adenopathy.  Neurological: He is alert and oriented to person, place, and time. He has normal strength. No cranial nerve deficit or sensory deficit.  Skin: Skin is warm and dry.  Psychiatric: He has a normal mood and affect.     Diagnostic Tests:            *  [facility]*            [address]            [place], [place] [address]              [phone]  ------------------------------------------------------------------- Echocardiography  Patient:  Dennis Bates, Dennis Bates MR #:    [id number] Study Date: 11/12/2014 Gender:   M Age:    54 Height:   170.2 cm Weight:   83 kg BSA:    2 m^2 Pt. Status: Room:  ATTENDING  Donato Schultz, M.D. PERFORMING [facility] Outpatient ORDERING  Janetta Hora REFERRING  Cline Crock R  cc:  ------------------------------------------------------------------- LV EF: 60% -  65%  ------------------------------------------------------------------- Indications:   (I35.0).  ------------------------------------------------------------------- History:  PMH: Acquired from the patient and from the patient&'s chart. Dyspnea. Coronary artery disease. Aortic stenosis. PMH: Myocardial infarction. Risk factors: Hypertension. Diabetes mellitus.  Dyslipidemia.  ------------------------------------------------------------------- Study Conclusions  - Left ventricle: The cavity size was normal. There was mild focal basal hypertrophy of the septum. Systolic function was normal. The estimated ejection fraction was in the range of 60% to 65%. Wall motion was normal; there were no regional wall motion abnormalities. - Aortic valve: A bicuspid morphology cannot be excluded; severely thickened, severely calcified leaflets. Cusp separation was reduced. There was severe stenosis. Peak velocity (S): 421 cm/s. Valve area (Vmax): 0.79 cm^2. - Aorta: Ascending aortic diameter: 46 mm (S).  Impressions:  - When compared to prior, Aortic stenosis is now severe.  Echocardiography. M-mode, limited 2D, limited spectral Doppler, and color Doppler. Birthdate: Patient birthdate: [birth year]. Age: Patient is 55 yr old. Sex: Gender: male.  BMI: 28.7 kg/m^2. Blood pressure:   110/70 Patient status: Outpatient. Study date: Study date: 11/12/2014. Study time: 07:58 AM. Location: [facility]  -------------------------------------------------------------------  ------------------------------------------------------------------- Left ventricle: The cavity size was normal. There was mild focal basal hypertrophy of the septum. Systolic function was normal. The estimated ejection fraction was in the range of 60% to 65%. Wall motion was normal; there were no regional wall motion abnormalities.  ------------------------------------------------------------------- Aortic valve:  A bicuspid morphology cannot be excluded; severely thickened, severely calcified leaflets. Cusp separation was reduced. Doppler:  There was severe stenosis.  There was no regurgitation.  VTI ratio of LVOT to aortic valve: 0.18. Valve area (VTI): 0.67 cm^2. Indexed valve area (VTI): 0.33 cm^2/m^2. Peak velocity ratio of LVOT to aortic valve:  0.21. Valve area (Vmax): 0.79 cm^2. Indexed valve area (Vmax): 0.39 cm^2/m^2. Mean velocity ratio of LVOT to aortic valve: 0.2. Valve area (Vmean): 0.75 cm^2. Indexed valve area (Vmean): 0.37 cm^2/m^2.  Mean gradient (S): 41 mm Hg. Peak gradient (S): 71 mm Hg.  ------------------------------------------------------------------- Aorta: Aortic root: The aortic root was normal in size. Ascending aorta: The ascending aorta was mildly dilated.  ------------------------------------------------------------------- Mitral valve:  Structurally normal valve.  Mobility was not restricted. Doppler: Transvalvular velocity was within the normal range. There was no evidence for stenosis. There was no regurgitation.  ------------------------------------------------------------------- Left atrium: The atrium was normal in size.  ------------------------------------------------------------------- Right ventricle: The cavity size was normal. Wall thickness was normal. Systolic function was normal.  ------------------------------------------------------------------- Pulmonic valve:  Structurally normal valve.  Cusp separation was normal. Doppler: Transvalvular velocity was within the normal range. There was no evidence for stenosis. There was trivial regurgitation.  ------------------------------------------------------------------- Tricuspid valve:  Structurally normal valve.  Doppler: Transvalvular velocity was within the normal range. There was no regurgitation.  ------------------------------------------------------------------- Pulmonary artery:  The main pulmonary artery  was normal-sized. Systolic pressure was within the normal range.  ------------------------------------------------------------------- Right atrium: The atrium was normal in size.  ------------------------------------------------------------------- Pericardium: There was no pericardial  effusion.  ------------------------------------------------------------------- Systemic veins: Inferior vena cava: The vessel was normal in size.  ------------------------------------------------------------------- Measurements  Left ventricle              Value     Reference LV ID, ED, PLAX chordal          44.4 mm    43 - 52 LV ID, ES, PLAX chordal          31.1 mm    23 - 38 LV fx shortening, PLAX chordal      30  %    >=29 LV PW thickness, ED            11  mm    --------- IVS/LV PW ratio, ED        (H)   1.33      <=1.3 Stroke volume, 2D             68  ml    --------- Stroke volume/bsa, 2D           34  ml/m^2  ---------  Ventricular septum            Value     Reference IVS thickness, ED             14.6 mm    ---------  LVOT                   Value     Reference LVOT ID, S                22  mm    --------- LVOT area                 3.8  cm^2   --------- LVOT peak velocity, S           87.2 cm/s   --------- LVOT mean velocity, S           58.2 cm/s   --------- LVOT VTI, S                17.9 cm    ---------  Aortic valve               Value     Reference Aortic valve peak velocity, S       421  cm/s   --------- Aortic valve mean velocity, S       296  cm/s   --------- Aortic valve VTI, S            101  cm    --------- Aortic mean gradient, S          41  mm Hg  --------- Aortic peak gradient, S          71  mm Hg  --------- VTI ratio, LVOT/AV            0.18      --------- Aortic valve area, VTI          0.67 cm^2   --------- Aortic valve area/bsa, VTI        0.33 cm^2/m^2  --------- Velocity ratio, peak, LVOT/AV       0.21      --------- Aortic valve area, peak velocity     0.79 cm^2   --------- Aortic valve area/bsa, peak  0.39 cm^2/m^2 --------- velocity Velocity ratio, mean, LVOT/AV       0.2      --------- Aortic valve area, mean velocity     0.75 cm^2   --------- Aortic valve area/bsa, mean        0.37 cm^2/m^2 --------- velocity  Aorta                   Value     Reference Aortic root ID, ED            37  mm    --------- Ascending aorta ID, A-P, S        46  mm    ---------  Left atrium                Value     Reference LA ID, A-P, ES              37  mm    --------- LA ID/bsa, A-P              1.85 cm/m^2  <=2.2  Legend: (L) and (H) mark values outside specified reference range.  ------------------------------------------------------------------- Prepared and Electronically Authenticated by  Donato Schultz, M.D. 2016-03-01T09:44:08     Lennette Bihari, MD (Primary)       Procedures    Right Heart Cath and Coronary Angiography    Conclusion     Prox RCA lesion, 25% stenosed.  Mid LAD lesion, 20% stenosed.  Previously documented severe aortic valve stenosis by echocardiography 10 months ago.  Supravalvular aortography demonstrating significant aortic root dilatation and a calcified aortic valve with markedly reduced excursion without significant aortic insufficiency.  Mild nonobstructive CAD with smooth 20% proximal LAD stenosis; widely patent left circumflex stent; and mild luminal irregularity of 25% in the proximal RCA.  RECOMMENDATION: The patient already has a surgical evaluation to see Dr. Laneta Simmers on 09/24/2015. A 2-D echo Doppler study will be scheduled to be done prior to that office visit. He is also scheduled to undergo CT  angiography for sizing of his aortic root dilatation and potential need for aortic root replacement combined with aortic valve replacement.     Indications    CAD in native artery [I25.10 (ICD-10-CM)]   Aortic stenosis [I35.0 (ICD-10-CM)]   Aortic root dilatation (HCC) [Z61.096 (ICD-10-CM)]    Technique and Indications    Mr. Foster Frericks is a 55 year old white male who is a patient of Dr. [name]. Has history of diabetes mellitus, hypertension, and in January 2016 underwent stenting of a 99% circumflex marginal stenosis in the setting of an MI. March 2016. An echo Doppler study suggested severe aortic stenosis with a valve area of 0.79 and a mean aortic gradient of 41. The patient has a history of a dilated aortic root. He had deferred further evaluation due to insurance issues. He recently was seen for preoperative cardiology assessment. Prior to planning to see Dr. Laneta Simmers on 09/24/2015 for evaluation. He is now referred for right and left heart catheterization.  The patient was brought to the second floor [facility] cath lab in the fasting state. Versed 2 mg and fentanyl 50 mcg were administered for conscious sedation. The right groin was prepped and draped in sterile fashion and a 5 [place] arterial sheath and 7 French venous sheath were inserted without difficulty. A Swan-Ganz catheter was advanced into the venous sheath and pressures were obtained in the right atrium, right ventricle, pulmonary artery, and pulmonary capillary wedge position. Cardiac outputs were  obtained by the thermodilution and assumed Fick methods. Oxygen saturation was obtained in the pulmonary artery and aorta. A pigtail catheter was inserted and simultaneous AO/PA pressures were recorded. The pigtail catheter was unable to be advanced into the left ventricle. Supravalvular aortography was performed to document aortic valve excursion the potential for aortic insufficiency as well as to further  evaluate the aortic root dilatation. The pigtail catheter was then removed and diagnostic catheterization to delineate the coronary anatomy was performed utilizing 5 French Judkins 4 left and right diagnostic catheters. A short trial using the RCA catheter and a Gibson Ramp catheter was done with a straight wire in attempt to see if the valve would able to be crossed easily. As the valve was not crossed easily. The decision was made not to attempt further crossing and an echo Doppler study will be performed to reconfirm the severity of the patient's aortic valve stenosis. All catheters were removed and the patient. Hemostasis was obtained by direct manual pressure. The patient tolerated the procedure well and returned to his room in satisfactory condition.  Estimated blood loss <50 mL. There were no immediate complications during the procedure.    Coronary Findings    Dominance: Right   Left Anterior Descending   . Mid LAD lesion, 20% stenosed.     Ramus Intermedius  . Vessel is small.     Left Circumflex   . Dist Cx lesion, 0% stenosed. Previously placed Dist Cx stent (unknown type) is patent.     Right Coronary Artery   . Prox RCA lesion, 25% stenosed.       Right Heart Pressures Hemodynamic findings consistent with aortic stenosis. RA: a 6; v 5 mean 4 RV: 30/7 PA: 28/10 mean 19 PW: 13  AO: 110/69 PA: 24/7   Cardiac output by the thermodilution method 6.3, and by the Fick method 5.7 L/m. Cardiac index 3.1 and 2.8 L/m/m, respectively.    Coronary Diagrams    Diagnostic Diagram            Implants    Name ID Temporary Type Supply   No information to display    PACS Images    Show images for Cardiac catheterization     Link to Procedure Log    Procedure Log      Hemo Data       Most Recent Value   Fick Cardiac Output  5.7 L/min   Fick Cardiac Output Index  2.81 (L/min)/BSA    Thermal Cardiac Output  6.28 L/min   Thermal Cardiac Output Index  3.09 (L/min)/BSA   RA A Wave  6 mmHg   RA V Wave  5 mmHg   RA Mean  4 mmHg   RV Systolic Pressure  30 mmHg   RV Diastolic Pressure  1 mmHg   RV EDP  7 mmHg   PA Systolic Pressure  24 mmHg   PA Diastolic Pressure  7 mmHg   PA Mean  18 mmHg   PW A Wave  13 mmHg   PW V Wave  13 mmHg   PW Mean  9 mmHg   AO Systolic Pressure  111 mmHg   AO Diastolic Pressure  70 mmHg   AO Mean  88 mmHg   TPVR Index  6.14 HRUI   TSVR Index  28.13 HRUI   PVR SVR Ratio  0.12   TPVR/TSVR Ratio  0.22       CLINICAL DATA: 55 year old male with history of aortic stenosis. Shortness of  breath for several months.  EXAM: CT ANGIOGRAPHY CHEST WITH CONTRAST  TECHNIQUE: Multidetector CT imaging of the chest was performed using the standard protocol during bolus administration of intravenous contrast. Multiplanar CT image reconstructions and MIPs were obtained to evaluate the vascular anatomy.  CONTRAST: 100 mL of Isovue 370.  COMPARISON: No priors.  FINDINGS: Creatinine was obtained on site at [facility] at [address]  Results: Creatinine 0.7 mg/dL.  Mediastinum/Lymph Nodes: Heart size is normal. There is no significant pericardial fluid, thickening or pericardial calcification. There is atherosclerosis of the thoracic aorta, the great vessels of the mediastinum and the coronary arteries, including calcified atherosclerotic plaque in the left circumflex coronary artery. Severe thickening and calcification of the aortic valve. Mild aneurysmal dilatation of the ascending thoracic aorta which measures up to 4.6 cm in diameter. The arch is normal in caliber measuring 2.9 cm in diameter just distal to the origin of the left subclavian artery. Descending thoracic aorta is also normal in caliber  measuring 2.4 cm in diameter. No evidence of thoracic aortic dissection. Normal three-vessel right-sided aortic arch. No pathologically enlarged mediastinal or hilar lymph nodes. Esophagus is unremarkable in appearance. No axillary lymphadenopathy.  Lungs/Pleura: No suspicious appearing pulmonary nodules or masses. No acute consolidative airspace disease. No pleural effusions. No pneumothorax.  Upper Abdomen: Diffuse low attenuation throughout the hepatic parenchyma, compatible with severe hepatic steatosis. Intermediate to high attenuation material layering dependently in the gallbladder, compatible with biliary sludge and/or noncalcified gallstones. No current findings to suggest an acute cholecystitis at this time.  Musculoskeletal/Soft Tissues: There are no aggressive appearing lytic or blastic lesions noted in the visualized portions of the skeleton.  Review of the MIP images confirms the above findings.  IMPRESSION: 1. Severely thickened and heavily calcified aortic valve, compatible with the reported clinical history of aortic stenosis. This is associated with mild aneurysmal dilatation of the ascending thoracic aorta which currently measures 4.6 cm in diameter. 2. Normal right sided 3 vessel aortic arch. 3. Atherosclerosis, including left circumflex coronary artery disease. Please note that although the presence of coronary artery calcium documents the presence of coronary artery disease, the severity of this disease and any potential stenosis cannot be assessed on this non-gated CT examination. Assessment for potential risk factor modification, dietary therapy or pharmacologic therapy may be warranted, if clinically indicated. 4. Severe hepatic steatosis. 5. Biliary sludge and/or noncalcified gallstones lying dependently in the gallbladder. No findings to suggest an acute cholecystitis at this time.   Electronically Signed  By: Trudie Reed M.D.  On:  09/24/2015 14:47     *[place]*         *[facility]*            [address]            [place], [place] [address]              [phone]  ------------------------------------------------------------------- Transthoracic Echocardiography  Patient:  Yigit, Norkus MR #:    [id number] Study Date: 09/23/2015 Gender:   M Age:    64 Height:   172.7 cm Weight:   88.9 kg BSA:    2.09 m^2 Pt. Status: Room:  ATTENDING  Nicki Guadalajara, M.D. ORDERING   Nicki Guadalajara, M.D. REFERRING  Nicki Guadalajara, M.D. SONOGRAPHER [facility] PERFORMING  [facility], Outpatient  cc:  ------------------------------------------------------------------- LV EF: 55% -  60%  ------------------------------------------------------------------- Indications:   Aortic stenosis 424.1.  ------------------------------------------------------------------- History:  PMH: Aortic Root Dilitation. Coronary artery  disease. PMH:  Myocardial infarction. Risk factors: Diabetes mellitus. Dyslipidemia.  ------------------------------------------------------------------- Study Conclusions  - Left ventricle: The cavity size was normal. There was moderate concentric hypertrophy. Systolic function was normal. The estimated ejection fraction was in the range of 55% to 60%. Wall motion was normal; there were no regional wall motion abnormalities. Features are consistent with a pseudonormal left ventricular filling pattern, with concomitant abnormal relaxation and increased filling pressure (grade 2 diastolic dysfunction). - Aortic valve: Possibly bicuspid; severely thickened, severely calcified leaflets. Valve mobility was restricted. There was severe stenosis. Peak velocity (S): 419 cm/s. Mean gradient (S): 42 mm Hg. Valve area (VTI): 0.66 cm^2. Valve area (Vmax): 0.63 cm^2. Valve area  (Vmean): 0.66 cm^2. - Aorta: Aortic root dimension: 42 mm (ED). - Aortic root: The aortic root was mildly dilated.  Impressions:  - No significant change from prior echocardiogram. Prior aortic root size was 45mm. Aortic stenosis remains severe.  Transthoracic echocardiography. M-mode, complete 2D, spectral Doppler, and color Doppler. Birthdate: Patient birthdate: 1960/11/12. Age: Patient is 54 yr old. Sex: Gender: male. BMI: 29.8 kg/m^2. Blood pressure:   124/90 Patient status: Inpatient. Study date: Study date: 09/23/2015. Study time: 02:16 PM. Location: Bedside.  -------------------------------------------------------------------  ------------------------------------------------------------------- Left ventricle: The cavity size was normal. There was moderate concentric hypertrophy. Systolic function was normal. The estimated ejection fraction was in the range of 55% to 60%. Wall motion was normal; there were no regional wall motion abnormalities. Features are consistent with a pseudonormal left ventricular filling pattern, with concomitant abnormal relaxation and increased filling pressure (grade 2 diastolic dysfunction).  ------------------------------------------------------------------- Aortic valve: Poorly visualized. Possibly bicuspid; severely thickened, severely calcified leaflets. Valve mobility was restricted. Doppler:  There was severe stenosis.  There was no regurgitation.  VTI ratio of LVOT to aortic valve: 0.21. Valve area (VTI): 0.66 cm^2. Indexed valve area (VTI): 0.32 cm^2/m^2. Peak velocity ratio of LVOT to aortic valve: 0.2. Valve area (Vmax): 0.63 cm^2. Indexed valve area (Vmax): 0.3 cm^2/m^2. Mean velocity ratio of LVOT to aortic valve: 0.21. Valve area (Vmean): 0.66 cm^2. Indexed valve area (Vmean): 0.32 cm^2/m^2.  Mean gradient (S): 42 mm Hg. Peak gradient (S): 70 mm  Hg.  ------------------------------------------------------------------- Aorta: Aortic root: The aortic root was mildly dilated.  ------------------------------------------------------------------- Mitral valve:  Structurally normal valve.  Mobility was not restricted. Doppler: Transvalvular velocity was within the normal range. There was no evidence for stenosis. There was trivial regurgitation.  Peak gradient (D): 3 mm Hg.  ------------------------------------------------------------------- Left atrium: The atrium was normal in size.  ------------------------------------------------------------------- Right ventricle: The cavity size was normal. Wall thickness was normal. Systolic function was normal.  ------------------------------------------------------------------- Pulmonic valve:  Poorly visualized. Structurally normal valve. Cusp separation was normal. Doppler: Transvalvular velocity was within the normal range. There was no evidence for stenosis. There was no regurgitation.  ------------------------------------------------------------------- Tricuspid valve:  Structurally normal valve.  Doppler: Transvalvular velocity was within the normal range. There was no regurgitation.  ------------------------------------------------------------------- Pulmonary artery:  The main pulmonary artery was normal-sized. Systolic pressure was within the normal range.  ------------------------------------------------------------------- Right atrium: The atrium was normal in size.  ------------------------------------------------------------------- Pericardium: There was no pericardial effusion.  ------------------------------------------------------------------- Systemic veins: Inferior vena cava: The vessel was normal in size.  ------------------------------------------------------------------- Measurements  Left ventricle              Value      Reference LV ID, ED, PLAX chordal      (L)   39.8 mm    43 - 52 LV ID, ES,  PLAX chordal          25.9 mm    23 - 38 LV fx shortening, PLAX chordal      35  %    >=29 LV PW thickness, ED            16.2 mm    --------- IVS/LV PW ratio, ED            0.91      <=1.3 Stroke volume, 2D             62  ml    --------- Stroke volume/bsa, 2D           30  ml/m^2  --------- LV e&', lateral              6.2  cm/s   --------- LV E/e&', lateral             15.02     --------- LV e&', medial               4.57 cm/s   --------- LV E/e&', medial              20.37     --------- LV e&', average              5.39 cm/s   --------- LV E/e&', average             17.29     ---------  Ventricular septum            Value     Reference IVS thickness, ED             14.8 mm    ---------  LVOT                   Value     Reference LVOT ID, S                20  mm    --------- LVOT area                 3.14 cm^2   --------- LVOT peak velocity, S           84.1 cm/s   --------- LVOT mean velocity, S           61.8 cm/s   --------- LVOT VTI, S                19.6 cm    ---------  Aortic valve               Value     Reference Aortic valve peak velocity, S       419  cm/s   --------- Aortic valve mean velocity, S       294  cm/s   --------- Aortic valve VTI, S            93.2 cm    --------- Aortic mean gradient, S          42  mm Hg  --------- Aortic peak gradient, S          70  mm Hg  --------- VTI ratio, LVOT/AV            0.21       --------- Aortic valve area, VTI          0.66 cm^2   --------- Aortic valve area/bsa, VTI        0.32 cm^2/m^2 --------- Velocity ratio, peak, LVOT/AV  0.2      --------- Aortic valve area, peak velocity     0.63 cm^2   --------- Aortic valve area/bsa, peak        0.3  cm^2/m^2 --------- velocity Velocity ratio, mean, LVOT/AV       0.21      --------- Aortic valve area, mean velocity     0.66 cm^2   --------- Aortic valve area/bsa, mean        0.32 cm^2/m^2 --------- velocity Aortic regurg pressure half-time     401  ms    ---------  Aorta                   Value     Reference Aortic root ID, ED            42  mm    ---------  Left atrium                Value     Reference LA ID, A-P, ES              33  mm    --------- LA ID/bsa, A-P              1.58 cm/m^2  <=2.2 LA volume, ES, 1-p A4C          31.2 ml    --------- LA volume/bsa, ES, 1-p A4C        14.9 ml/m^2  --------- LA volume, ES, 1-p A2C          42.9 ml    --------- LA volume/bsa, ES, 1-p A2C        20.5 ml/m^2  ---------  Mitral valve               Value     Reference Mitral E-wave peak velocity        93.1 cm/s   --------- Mitral A-wave peak velocity        75.2 cm/s   --------- Mitral deceleration time         176  ms    150 - 230 Mitral peak gradient, D          3   mm Hg  --------- Mitral E/A ratio, peak          1.2      ---------  Systemic veins              Value     Reference Estimated CVP               3   mm Hg  ---------  Right ventricle              Value     Reference TAPSE                    18  mm    ---------  Legend: (L) and (H) mark values outside specified reference range.  ------------------------------------------------------------------- Prepared and Electronically Authenticated by  Donato Schultz, M.D. 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 His LCX stent is widely patent and there is no other significant coronary stenosis. I think the best treatment is a Bentall procedure to replace his valve and aorta using a mechanical valved graft.  I discussed the benefits and risks of mechanical and tissue valves with the patient and his wife. He is only 54 and a tissue valve would have a limited lifespan for him and may only last less than 10 years. He has no contraindication to being on Coumadin and is a very compliant patient. He and his wife are in agreement with a mechanical valve and understand the need for lifelong anticoagulation with Coumadin with this valve. I discussed the operative procedure with the patient and his wife including alternatives, benefits and risks; including but not limited to bleeding, blood transfusion, infection, stroke, myocardial infarction, graft failure, heart block requiring a permanent pacemaker, organ dysfunction, and death. Dennis Bates understands and agrees to proceed. We will schedule surgery for Thursday 10/08/2014. He will stop his Effient now since he has been on it for a year and will continue his aspirin. He will hold his Metformin for 48 hrs preop.  Plan:  Bentall Procedure using a mechanical valved graft on 10/08/2014.       Alleen Borne, MD [facility] [phone]

## 2015-10-09 NOTE — Transfer of Care (Signed)
Immediate Anesthesia Transfer of Care Note  Patient: Dennis Bates  Procedure(s) Performed: Procedure(s) with comments: BENTALL PROCEDURE (using a St Jude mechanical valve, size 23) (N/A) - CIRC ARREST  NEEDS RIGHT RADIAL A-LINE TRANSESOPHAGEAL ECHOCARDIOGRAM (TEE) (N/A)  Patient Location: SICU  Anesthesia Type:General  Level of Consciousness: Patient remains intubated per anesthesia plan  Airway & Oxygen Therapy: Patient remains intubated per anesthesia plan and Patient placed on Ventilator (see vital sign flow sheet for setting)  Post-op Assessment: Report given to RN and Post -op Vital signs reviewed and stable  Post vital signs: Reviewed and stable  Last Vitals:  Filed Vitals:   10/09/15 0604  BP: 109/78  Pulse: 70  Temp: 36.3 C  Resp: 20    Complications: No apparent anesthesia complications

## 2015-10-09 NOTE — Progress Notes (Signed)
Initiated Open Heart Rapid Wean protocol per policy. RT will continue to monitor and wean as tolerated, per policy

## 2015-10-09 NOTE — Progress Notes (Signed)
Utilization review completed.  

## 2015-10-09 NOTE — Procedures (Signed)
Extubation Procedure Note  Patient Details:   Name: Dennis Bates DOB: [birth year] MRN: [id number]   Airway Documentation:  Airway 8 mm (Active)  Secured at (cm) 23 cm 10/09/2015  8:06 PM  Measured From Lips 10/09/2015  8:06 PM  Secured Location Right 10/09/2015  8:06 PM  Secured By Pink Tape 10/09/2015  8:06 PM  Cuff Pressure (cm H2O) 28 cm H2O 10/09/2015  3:53 PM  Site Condition Dry 10/09/2015  8:06 PM    Evaluation  O2 sats: stable throughout Complications: No apparent complications Patient did tolerate procedure well. Bilateral Breath Sounds: Clear Suctioning: Oral, Airway Yes  Pt passed rapid wean protocol qualification for extubation.  Extubated to nasal canula without incident.  Pt able to phonate and exhibits strong cough.    Ronda Fairly Rene Sizelove 10/09/2015, 8:18 PM

## 2015-10-09 NOTE — Progress Notes (Signed)
NIF -20, VC 1.1L, ABG within normal limits. Pt meets criteria for extubation. RT will continue to monitor

## 2015-10-09 NOTE — Interval H&P Note (Signed)
History and Physical Interval Note:  10/09/2015 5:47 AM  Dennis Bates  has presented today for surgery, with the diagnosis of SEVERE AS TAA  The various methods of treatment have been discussed with the patient and family. After consideration of risks, benefits and other options for treatment, the patient has consented to  Procedure(s) with comments: BENTALL PROCEDURE (N/A) - CIRC ARREST  NEEDS RIGHT RADIAL A-LINE TRANSESOPHAGEAL ECHOCARDIOGRAM (TEE) (N/A) as a surgical intervention .  The patient's history has been reviewed, patient examined, no change in status, stable for surgery.  I have reviewed the patient's chart and labs.  Questions were answered to the patient's satisfaction.     Alleen Borne

## 2015-10-09 NOTE — OR Nursing (Signed)
1st call made to 2S (spoke with Wynona Canes) , providing a patient update 2nd call made to 2S (spoke with Wynona Canes) , providing a patient update 3rd call made to 2S (spoke with Tyler Aas) , confirming plans for tranfer to 2S Room 10 postoperatively. 4th call made to 2S, "on the way"

## 2015-10-09 NOTE — Op Note (Signed)
CARDIOVASCULAR SURGERY OPERATIVE NOTE  10/09/2015  Surgeon:  Alleen Borne, MD  First Assistant: Doree Fudge,  PA-C   Preoperative Diagnosis:  Bicuspid aortic valve with severe aortic stenosis and ascending aortic aneurysm   Postoperative Diagnosis:  Same   Procedure:  1. Median Sternotomy 2. Extracorporeal circulation 3.   Replacement of ascending aortic aneurysm using a 30 mm Hemashield graft under deep hypothermic circulatory arrest. 4.   Bentall procedure using a 23 mm St. Jude [employer] information [employer] Series valved graft.  Anesthesia:  General Endotracheal   Clinical History/Surgical Indication:   The patient is a 55 year old gentleman with type 2 DM, hyperlipidemia, CAD s/p inferior STEMI and PCI/DES to a 99% OM2 09/2014, known bicuspid aortic valve stenosis. When he saw cardiology on 10/01/2014 he reported some mild exertional dyspnea with walks. About a month later he was evaluated for dizziness and near syncope that was similar to his prodromal symptoms at the time of his MI. He noted at least two episodes of dizziness and flushing with near syncope that occurred at rest. His last echo on 11/12/2014 showed severe AS with a peak velocity of 421 cm/sec corresponding to a valve area of 0.79 cm2. The ascending aorta was 46 mm. LVEF was 60-65%. Mean AV gradient was 41 mm Hg. DI was 0.2. Since then he has not had any further episodes of dizziness but continues to have exertional fatigue and shortness of breath. Since he was still on Effient post DES he was observed but was referred for surgical evaluation in July. He does report occasional episodes of left chest dull aching that is not always related to exertion. He did have some enlargement of his aorta on cath and I felt that he would need AVR and probably replacement of his ascending aorta. He wanted to wait until after November so that he could get health insurance prior to surgery. He underwent R/L heart cath on 09/18/2015 which  showed a widely patent LCx stent and mild luminal irregularities in the LAD and RCA. A follow up echo on 09/23/2015 showed a bicuspid aortic valve that was severely thickened and calcified with restricted leaflet mobility and a mean gradient of 42 mm Hg. The aortic root was measured at 42 mm. A CT angio of the chest on 09/24/2015 showed a fusiform ascending aortic aneurysm with a maximum diameter of 4.6 cm. The descending aorta is 2.4 cm.   He has a bicuspid aortic valve with stage D severe symptomatic aortic stenosis and had PCI of a 99% OM2 lesion presenting with STEMI in Jan 2016. 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 His LCX stent is widely patent and there is no other significant coronary stenosis. I think the best treatment is a Bentall procedure to replace his valve and aorta using a mechanical valved graft. I discussed the benefits and risks of mechanical and tissue valves with the patient and his wife. He is only 55 and a tissue valve would have a limited lifespan for him and may only last less than 10 years. He has no contraindication to being on Coumadin and is a very compliant patient. He and his wife are in agreement with a mechanical valve and understand the need for lifelong anticoagulation with Coumadin with this valve. I discussed the operative procedure with the patient and his wife including alternatives, benefits and risks; including but not limited to bleeding, blood transfusion, infection, stroke, myocardial infarction, graft failure,  heart block requiring a permanent pacemaker, organ dysfunction, and death. Rohil Lesch understands and agrees to proceed.  Preparation:  The patient was seen in the preoperative holding area and the correct patient, correct operation were confirmed with the patient after reviewing the medical record and catheterization. The consent was signed by me. Preoperative antibiotics were given. A  pulmonary arterial line and radial arterial line were placed by the anesthesia team. The patient was taken back to the operating room and positioned supine on the operating room table. After being placed under general endotracheal anesthesia by the anesthesia team a foley catheter was placed. The neck, chest, abdomen, and both legs were prepped with betadine soap and solution and draped in the usual sterile manner. A surgical time-out was taken and the correct patient and operative procedure were confirmed with the nursing and anesthesia staff.  TEE: performed by Dr. Lewie Loron  This showed a bicuspid aortic valve that was heavily calcified, thickened and poorly mobile with severe aortic stenosis, normal LVEF, dilation of the aortic root.   Cardiopulmonary Bypass:  A median sternotomy was performed. The pericardium was opened in the midline. Right ventricular function appeared normal. The ascending aorta was aneurysmal but had no palpable plaque. There were no contraindications to aortic cannulation. The patient was fully systemically heparinized and the ACT was maintained > 400 sec. The distal ascending aorta was cannulated with a 20 F aortic cannula for arterial inflow. Venous cannulation was performed via the right atrial appendage using a two-staged venous cannula. A temperature probe was inserted into the interventricular septum and an insulating pad was placed in the pericardium. CO2 was insufflated into the pericardium throughout the case to minimize intracardiac air.   Resection and grafting of ascending aortic aneurysm:  The patient was placed on cardiopulmonary bypass and a left ventricular vent was placed via the right superior pulmonary vein. Systemic cooling was begun with a goal temperature of 18 degrees centigrade by bladder and rectal temperature probes. A retrograde cardioplegia cannula was placed through the right atrium into the coronary sinus without difficulty. A retrograde  cerebral perfusion cannula was placed into the SVC through a pursestring suture and the SVC was encircled with a silastic tape. After 30 minutes of cooling the target temperature of 18 degrees centigrade was reached. Cerebral oximetry was 70% bilaterally. BIS was zero. The patient was given 150 mg of Etomidate and 125 mg of Solumedrol. The head was packed in ice. The bed was placed in steep trendelenburg. Circulatory arrest was begun and the blood volume emptied into the venous reservoir. Continuous retrograde cerebral perfusion was begun and the SVC occluded with the silastic tape. Cold blood retrograde cardioplegia was given and myocardial temperature dropped to 10 degrees centigrade. Additional doses were given at approximately 20 minute intervals throughout the period of circulatory arrest and cross-clamping. Complete diastolic arrest was maintained. The aortic cannula was removed. The aorta was transected just proximal to the innominate artery beveling the resection out along the undersurface of the aortic arch (Hemiarch replacement). The aortic diameter was measured at 30 mm here. A 30 x 10 mm Hemasheild Platinum vascular graft was prepared. ( Catalog # [id number] [place], [place] [id number]). It was anastomosed to the aortic arch in an end to end manner using 3-0 prolene continuous suture with a felt strip to reinforce the anastomisis. A light coating of CoSeal was applied to seal needle holes. The arterial end of the bypass circuit was then connected to the 10mm side arm graft and circulation  was slowly resumed. The tape was removed from the SVC. The aortic graft was cross-clamped proximal to the side arm graft and full CPB support was resumed. Circulatory arrest time was 22 minutes. Retrograde cerebral perfusion time was 19 min.   Bentall Procedure:   The ascending aorta was mobilized from the right pulmonary artery and main PA. It was opened longitudinally and the valve inspected. It was a bicuspid valve with two  commissures and coronary arteries almost 180 degrees from eachother. The valve was severely calcified and stenotic. There was moderate annular calcium and a large exophytic growth of friable calcium on the left leaflet. The right and left coronary arteries were removed from the aortic root with a button of aortic wall around the ostia. They were retracted carefully out of the way with stay sutures to prevent rotation. The native valve was excised taking care to remove all particulate debri. The annulus was decalcified with rongeurs. The annulus was sized and a 23 mm St. Jude Mechanical Valved Graft was chosen. ( Ref # [id number], Serial # [id number]) A series of pledgetted 2-0 Ethibond horizontal mattress sutures were placed around the annulus with the pledgets in a sub-annular position. The sutures were placed through a strip of autologous pericardium to reinforce the annulus and then the valve sewing ring. The valve was lowered into place and the sutures at the hinge posts tied first followed by the remaining sutures. The valve seated nicely. The discs moved normally. Small openings were made in the graft for the coronary anastomoses using a thermal cautery. Then the left and right coronary buttons were anastomosed to the graft in an end to side manner using continuous 5-0 prolene suture. A light coating of CoSeal was applied to each anastomosis for hemostasis. The two grafts were then cut to the appropriate length and anastomosed end to end using continuous 3-0 prolene suture. CoSeal was applied to seal the needle holes in the grafts. A vent cannula was placed into the graft to remove any air. Deairing maneuvers were performed and the bed placed in trendelenburg position.   Completion:  The patient was rewarmed to 37 degrees Centigrade. The crossclamp was removed with a time of 134 minutes. There was spontaneous return of sinus rhythm. The position of the grafts was satisfactory. The vascular anastomoses all  appeared hemostatic. Two temporary epicardial pacing wires were placed on the right atrium and two on the right ventricle. The patient was weaned from CPB without difficulty on no inotropes. CPB time was 211 minutes. Cardiac output was 5 LPM. Heparin was fully reversed with protamine and the  venous cannula removed. The aortic sidearm graft was ligated with a heavy silk tie and suture ligated with a 3-0 prolene pledgetted suture. Hemostasis was achieved. Mediastinal and drainage tubes were placed. The sternum was closed with double #6 stainless steel wires. The fascia was closed with continuous # 1 vicryl suture. The subcutaneous tissue was closed with 2-0 vicryl continuous suture. The skin was closed with 3-0 vicryl subcuticular suture. All sponge, needle, and instrument counts were reported correct at the end of the case. Dry sterile dressings were placed over the incisions and around the chest tubes which were connected to pleurevac suction. The patient was then transported to the surgical intensive care unit in critical but stable condition.

## 2015-10-09 NOTE — Brief Op Note (Signed)
10/09/2015  12:48 PM  PATIENT:  Dennis Bates  55 y.o. male  PRE-OPERATIVE DIAGNOSIS:  1. SEVERE AS 2. THORACIC AORTIC ANEURYSM   POST-OPERATIVE DIAGNOSIS:  1. SEVERE AS 2. THORACIC AORTIC ANEURYSM 3.BICUSPID VALVE  PROCEDURE:  TRANSESOPHAGEAL ECHOCARDIOGRAM (TEE),BENTALL PROCEDURE  - CIRC ARREST (using a St Jude mechanical valve, size 23)  SURGEON:  Surgeon(s) and Role:    * Alleen Borne, MD - Primary  PHYSICIAN ASSISTANT: Doree Fudge PA-C  ANESTHESIA:   general  EBL:  Total I/O In: 1750 [I.V.:1750] Out: 1200 [Urine:1200]  BLOOD ADMINISTERED:Two CC PRBC  DRAINS: Chest tubes placed in the mediastinal and pleural spaces   SPECIMEN:  Source of Specimen:  Ascending thoracic aortic aneursym  DISPOSITION OF SPECIMEN:  PATHOLOGY  COUNTS CORRECT:  YES  DICTATION: .Dragon Dictation  PLAN OF CARE: Admit to inpatient   PATIENT DISPOSITION:  ICU - intubated and hemodynamically stable.   Delay start of Pharmacological VTE agent (>24hrs) due to surgical blood loss or risk of bleeding: yes  BASELINE WEIGHT: 86 kg  Aortic Valve Etiology   Aortic Insufficiency:  Trivial/Trace  Aortic Valve Disease:  Yes.  Aortic Stenosis:  Yes. Smallest Aortic Valve Area: 0.63cm2; Highest Mean Gradient: .  Etiology (Choose at least one and up to  5 etiologies):  Bicuspid valve disease and Degenerative - Calcified   Aortic Valve  Procedure Performed:  Replacement: Yes.  Mechanical Valve. Implant Model Number:[id number], Size:23, Unique Device Identifier:[id number]  Repair/Reconstruction: Yes.    Aortic Annular Enlargement: Yes.

## 2015-10-09 NOTE — Progress Notes (Signed)
*  PRELIMINARY RESULTS* Echocardiogram Echocardiogram Transesophageal has been performed.  Jeryl Columbia 10/09/2015, 9:07 AM

## 2015-10-10 ENCOUNTER — Encounter (HOSPITAL_COMMUNITY): Payer: Self-pay | Admitting: Surgery

## 2015-10-10 ENCOUNTER — Inpatient Hospital Stay (HOSPITAL_COMMUNITY): Payer: BC Managed Care – PPO

## 2015-10-10 LAB — TYPE AND SCREEN
ABO/RH(D): AB POS
Antibody Screen: NEGATIVE
Unit division: 0
Unit division: 0
Unit division: 0
Unit division: 0

## 2015-10-10 LAB — CBC
HCT: 30 % — ABNORMAL LOW (ref 39.0–52.0)
HCT: 31.1 % — ABNORMAL LOW (ref 39.0–52.0)
Hemoglobin: 10.9 g/dL — ABNORMAL LOW (ref 13.0–17.0)
Hemoglobin: 10.6 g/dL — ABNORMAL LOW (ref 13.0–17.0)
MCH: 30.8 pg (ref 26.0–34.0)
MCH: 29.5 pg (ref 26.0–34.0)
MCHC: 36.3 g/dL — ABNORMAL HIGH (ref 30.0–36.0)
MCHC: 34.1 g/dL (ref 30.0–36.0)
MCV: 84.7 fL (ref 78.0–100.0)
MCV: 86.6 fL (ref 78.0–100.0)
Platelets: 155 10*3/uL (ref 150–400)
Platelets: 140 10*3/uL — ABNORMAL LOW (ref 150–400)
RBC: 3.54 MIL/uL — ABNORMAL LOW (ref 4.22–5.81)
RBC: 3.59 MIL/uL — ABNORMAL LOW (ref 4.22–5.81)
RDW: 13.1 % (ref 11.5–15.5)
RDW: 13.2 % (ref 11.5–15.5)
WBC: 13.5 10*3/uL — ABNORMAL HIGH (ref 4.0–10.5)
WBC: 12 10*3/uL — ABNORMAL HIGH (ref 4.0–10.5)

## 2015-10-10 LAB — GLUCOSE, CAPILLARY
Glucose-Capillary: 101 mg/dL — ABNORMAL HIGH (ref 65–99)
Glucose-Capillary: 102 mg/dL — ABNORMAL HIGH (ref 65–99)
Glucose-Capillary: 102 mg/dL — ABNORMAL HIGH (ref 65–99)
Glucose-Capillary: 105 mg/dL — ABNORMAL HIGH (ref 65–99)
Glucose-Capillary: 106 mg/dL — ABNORMAL HIGH (ref 65–99)
Glucose-Capillary: 112 mg/dL — ABNORMAL HIGH (ref 65–99)
Glucose-Capillary: 115 mg/dL — ABNORMAL HIGH (ref 65–99)
Glucose-Capillary: 116 mg/dL — ABNORMAL HIGH (ref 65–99)
Glucose-Capillary: 118 mg/dL — ABNORMAL HIGH (ref 65–99)
Glucose-Capillary: 125 mg/dL — ABNORMAL HIGH (ref 65–99)
Glucose-Capillary: 129 mg/dL — ABNORMAL HIGH (ref 65–99)
Glucose-Capillary: 132 mg/dL — ABNORMAL HIGH (ref 65–99)
Glucose-Capillary: 164 mg/dL — ABNORMAL HIGH (ref 65–99)
Glucose-Capillary: 170 mg/dL — ABNORMAL HIGH (ref 65–99)
Glucose-Capillary: 224 mg/dL — ABNORMAL HIGH (ref 65–99)
Glucose-Capillary: 241 mg/dL — ABNORMAL HIGH (ref 65–99)
Glucose-Capillary: 87 mg/dL (ref 65–99)

## 2015-10-10 LAB — POCT I-STAT, CHEM 8
BUN: 9 mg/dL (ref 6–20)
Calcium, Ion: 1.13 mmol/L (ref 1.12–1.23)
Chloride: 102 mmol/L (ref 101–111)
Creatinine, Ser: 0.7 mg/dL (ref 0.61–1.24)
Glucose, Bld: 232 mg/dL — ABNORMAL HIGH (ref 65–99)
HCT: 37 % — ABNORMAL LOW (ref 39.0–52.0)
Hemoglobin: 12.6 g/dL — ABNORMAL LOW (ref 13.0–17.0)
Potassium: 4.2 mmol/L (ref 3.5–5.1)
Sodium: 136 mmol/L (ref 135–145)
TCO2: 21 mmol/L (ref 0–100)

## 2015-10-10 LAB — BASIC METABOLIC PANEL
Anion gap: 6 (ref 5–15)
BUN: 9 mg/dL (ref 6–20)
CO2: 24 mmol/L (ref 22–32)
Calcium: 7.6 mg/dL — ABNORMAL LOW (ref 8.9–10.3)
Chloride: 112 mmol/L — ABNORMAL HIGH (ref 101–111)
Creatinine, Ser: 0.7 mg/dL (ref 0.61–1.24)
GFR calc Af Amer: >60 mL/min (ref >60)
GFR calc non Af Amer: >60 mL/min (ref >60)
Glucose, Bld: 115 mg/dL — ABNORMAL HIGH (ref 65–99)
Potassium: 4.1 mmol/L (ref 3.5–5.1)
Sodium: 142 mmol/L (ref 135–145)

## 2015-10-10 LAB — MAGNESIUM
Magnesium: 2.5 mg/dL — ABNORMAL HIGH (ref 1.7–2.4)
Magnesium: 2.2 mg/dL (ref 1.7–2.4)

## 2015-10-10 LAB — CREATININE, SERUM
Creatinine, Ser: 0.86 mg/dL (ref 0.61–1.24)
GFR calc Af Amer: >60 mL/min (ref >60)
GFR calc non Af Amer: >60 mL/min (ref >60)

## 2015-10-10 MED ORDER — CARVEDILOL 3.125 MG PO TABS
3.1250 mg | ORAL_TABLET | Freq: Two times a day (BID) | ORAL | Status: DC
  Administered 2015-10-10 – 2015-10-14 (×9): 3.125 mg via ORAL
  Filled 2015-10-10 (×9): qty 1

## 2015-10-10 MED ORDER — POTASSIUM CHLORIDE 10 MEQ/50ML IV SOLN
10.0000 meq | INTRAVENOUS | Status: AC
Start: 2015-10-10 — End: 2015-10-10
  Administered 2015-10-10 (×3): 10 meq via INTRAVENOUS
  Filled 2015-10-10 (×2): qty 50

## 2015-10-10 MED ORDER — INSULIN DETEMIR 100 UNIT/ML ~~LOC~~ SOLN
20.0000 [IU] | Freq: Every day | SUBCUTANEOUS | Status: DC
  Administered 2015-10-10: 20 [IU] via SUBCUTANEOUS
  Filled 2015-10-10 (×2): qty 0.2

## 2015-10-10 MED ORDER — INSULIN DETEMIR 100 UNIT/ML ~~LOC~~ SOLN
20.0000 [IU] | Freq: Every day | SUBCUTANEOUS | Status: DC
  Administered 2015-10-11 – 2015-10-12 (×2): 20 [IU] via SUBCUTANEOUS
  Filled 2015-10-10 (×3): qty 0.2

## 2015-10-10 MED ORDER — INSULIN ASPART 100 UNIT/ML ~~LOC~~ SOLN
0.0000 [IU] | SUBCUTANEOUS | Status: DC
  Administered 2015-10-10 (×2): 8 [IU] via SUBCUTANEOUS
  Administered 2015-10-10: 2 [IU] via SUBCUTANEOUS
  Administered 2015-10-11: 4 [IU] via SUBCUTANEOUS
  Administered 2015-10-11: 2 [IU] via SUBCUTANEOUS

## 2015-10-10 MED ORDER — FUROSEMIDE 10 MG/ML IJ SOLN
40.0000 mg | Freq: Two times a day (BID) | INTRAMUSCULAR | Status: AC
  Administered 2015-10-10 (×2): 40 mg via INTRAVENOUS
  Filled 2015-10-10: qty 4

## 2015-10-10 MED ORDER — POTASSIUM CHLORIDE 10 MEQ/50ML IV SOLN
10.0000 meq | INTRAVENOUS | Status: AC
  Administered 2015-10-10 (×3): 10 meq via INTRAVENOUS
  Filled 2015-10-10: qty 50

## 2015-10-10 MED ORDER — METOCLOPRAMIDE HCL 5 MG/ML IJ SOLN
10.0000 mg | Freq: Four times a day (QID) | INTRAMUSCULAR | Status: AC
  Administered 2015-10-10 (×4): 10 mg via INTRAVENOUS
  Filled 2015-10-10 (×4): qty 2

## 2015-10-10 MED ORDER — CETYLPYRIDINIUM CHLORIDE 0.05 % MT LIQD
7.0000 mL | Freq: Two times a day (BID) | OROMUCOSAL | Status: DC
  Administered 2015-10-10 – 2015-10-17 (×9): 7 mL via OROMUCOSAL

## 2015-10-10 MED FILL — Lidocaine HCl IV Inj 20 MG/ML: INTRAVENOUS | Qty: 5 | Status: AC

## 2015-10-10 MED FILL — Heparin Sodium (Porcine) Inj 1000 Unit/ML: INTRAMUSCULAR | Qty: 10 | Status: AC

## 2015-10-10 MED FILL — Electrolyte-R (PH 7.4) Solution: INTRAVENOUS | Qty: 7000 | Status: AC

## 2015-10-10 MED FILL — Sodium Chloride IV Soln 0.9%: INTRAVENOUS | Qty: 2000 | Status: AC

## 2015-10-10 MED FILL — Mannitol IV Soln 20%: INTRAVENOUS | Qty: 500 | Status: AC

## 2015-10-10 MED FILL — Sodium Bicarbonate IV Soln 8.4%: INTRAVENOUS | Qty: 50 | Status: AC

## 2015-10-10 NOTE — Progress Notes (Signed)
1 Day Post-Op Procedure(s) (LRB): BENTALL PROCEDURE (using a St Jude mechanical valve, size 23) (N/A) TRANSESOPHAGEAL ECHOCARDIOGRAM (TEE) (N/A) Subjective:  No complaints  Objective: Vital signs in last 24 hours: Temp:  [96.3 F (35.7 C)-98.1 F (36.7 C)] 97.7 F (36.5 C) (01/27 0730) Pulse Rate:  [75-87] 79 (01/27 0730) Cardiac Rhythm:  [-] Normal sinus rhythm (01/27 0700) Resp:  [6-21] 11 (01/27 0730) BP: (90-111)/(56-86) 104/74 mmHg (01/27 0730) SpO2:  [92 %-100 %] 99 % (01/27 0730) Arterial Line BP: (88-165)/(49-86) 125/58 mmHg (01/27 0730) FiO2 (%):  [40 %-50 %] 40 % (01/26 2006) Weight:  [93.532 kg (206 lb 3.2 oz)] 93.532 kg (206 lb 3.2 oz) (01/27 0500)  Hemodynamic parameters for last 24 hours: PAP: (23-38)/(11-21) 28/18 mmHg CO:  [3.8 L/min-6.5 L/min] 4.9 L/min CI:  [1.9 L/min/m2-3.3 L/min/m2] 2.5 L/min/m2  Intake/Output from previous day: 01/26 0701 - 01/27 0700 In: 6156.5 [P.O.:30; I.V.:3764.5; Blood:312; IV Piggyback:2050] Out: 5610 [Urine:4610; Blood:600; Chest Tube:400] Intake/Output this shift:    General appearance: alert and cooperative Neurologic: intact Heart: regular rate and rhythm and crisp mechanical valve click Lungs: clear to auscultation bilaterally Extremities: edema mild Wound: dressing dry  Lab Results:  Recent Labs  10/09/15 2050 10/09/15 2052 10/10/15 0415  WBC 13.7*  --  13.5*  HGB 10.9* 9.5* 10.9*  HCT 31.2* 28.0* 30.0*  PLT 139*  --  155   BMET:  Recent Labs  10/07/15 1130  10/09/15 2052 10/10/15 0415  NA 139  < > 139 142  K 4.4  < > 4.1 4.1  CL 107  < > 111 112*  CO2 21*  --   --  24  GLUCOSE 159*  < > 160* 115*  BUN 12  < > 10 9  CREATININE 0.76  < > 0.60* 0.70  CALCIUM 10.0  --   --  7.6*  < > = values in this interval not displayed.  PT/INR:  Recent Labs  10/09/15 1420  LABPROT 18.6*  INR 1.55*   ABG    Component Value Date/Time   PHART 7.339* 10/09/2015 2133   HCO3 23.4 10/09/2015 2133   TCO2 25  10/09/2015 2133   ACIDBASEDEF 3.0* 10/09/2015 2133   O2SAT 98.0 10/09/2015 2133   CBG (last 3)   Recent Labs  10/10/15 0523 10/10/15 0628 10/10/15 0748  GLUCAP 101* 102* 102*   CXR ok  Assessment/Plan: S/P Procedure(s) (LRB): BENTALL PROCEDURE (using a St Jude mechanical valve, size 23) (N/A) TRANSESOPHAGEAL ECHOCARDIOGRAM (TEE) (N/A)  He is hemodynamically stable in sinus rhythm off pressors. Resume Coreg. Mobilize Diuresis Diabetes control: start Levemir and SSI. Stop insulin drip. d/c tubes/lines Continue foley due to diuresing patient and patient in ICU See progression orders  Will plan to start coumadin tomorrow.   LOS: 1 day    Alleen Borne 10/10/2015

## 2015-10-10 NOTE — Progress Notes (Signed)
Patient ID: Dennis Bates, male   DOB: [birth year], 55 y.o.   MRN: [id number]  SICU Evening Rounds:  Hemodynamically stable  Diuresed well today.  BMET    Component Value Date/Time   NA 136 10/10/2015 1531   K 4.2 10/10/2015 1531   CL 102 10/10/2015 1531   CO2 24 10/10/2015 0415   GLUCOSE 232* 10/10/2015 1531   BUN 9 10/10/2015 1531   CREATININE 0.70 10/10/2015 1531   CREATININE 0.75 09/16/2015 1519   CALCIUM 7.6* 10/10/2015 0415   GFRNONAA >60 10/10/2015 1520   GFRAA >60 10/10/2015 1520    CBC    Component Value Date/Time   WBC 12.0* 10/10/2015 1520   RBC 3.59* 10/10/2015 1520   HGB 12.6* 10/10/2015 1531   HCT 37.0* 10/10/2015 1531   PLT 140* 10/10/2015 1520   MCV 86.6 10/10/2015 1520   MCH 29.5 10/10/2015 1520   MCHC 34.1 10/10/2015 1520   RDW 13.2 10/10/2015 1520    Doing well

## 2015-10-11 ENCOUNTER — Inpatient Hospital Stay (HOSPITAL_COMMUNITY): Payer: BC Managed Care – PPO

## 2015-10-11 LAB — CBC
HCT: 30.5 % — ABNORMAL LOW (ref 39.0–52.0)
Hemoglobin: 10.8 g/dL — ABNORMAL LOW (ref 13.0–17.0)
MCH: 30.9 pg (ref 26.0–34.0)
MCHC: 35.4 g/dL (ref 30.0–36.0)
MCV: 87.1 fL (ref 78.0–100.0)
Platelets: 142 10*3/uL — ABNORMAL LOW (ref 150–400)
RBC: 3.5 MIL/uL — ABNORMAL LOW (ref 4.22–5.81)
RDW: 13.4 % (ref 11.5–15.5)
WBC: 13.3 10*3/uL — ABNORMAL HIGH (ref 4.0–10.5)

## 2015-10-11 LAB — BASIC METABOLIC PANEL
Anion gap: 4 — ABNORMAL LOW (ref 5–15)
BUN: 9 mg/dL (ref 6–20)
CO2: 28 mmol/L (ref 22–32)
Calcium: 8.1 mg/dL — ABNORMAL LOW (ref 8.9–10.3)
Chloride: 105 mmol/L (ref 101–111)
Creatinine, Ser: 0.77 mg/dL (ref 0.61–1.24)
GFR calc Af Amer: >60 mL/min (ref >60)
GFR calc non Af Amer: >60 mL/min (ref >60)
Glucose, Bld: 135 mg/dL — ABNORMAL HIGH (ref 65–99)
Potassium: 4.2 mmol/L (ref 3.5–5.1)
Sodium: 137 mmol/L (ref 135–145)

## 2015-10-11 LAB — GLUCOSE, CAPILLARY
Glucose-Capillary: 111 mg/dL — ABNORMAL HIGH (ref 65–99)
Glucose-Capillary: 142 mg/dL — ABNORMAL HIGH (ref 65–99)
Glucose-Capillary: 150 mg/dL — ABNORMAL HIGH (ref 65–99)
Glucose-Capillary: 177 mg/dL — ABNORMAL HIGH (ref 65–99)
Glucose-Capillary: 193 mg/dL — ABNORMAL HIGH (ref 65–99)
Glucose-Capillary: 198 mg/dL — ABNORMAL HIGH (ref 65–99)

## 2015-10-11 LAB — PROTIME-INR
INR: 1.3 (ref 0.00–1.49)
Prothrombin Time: 16.3 seconds — ABNORMAL HIGH (ref 11.6–15.2)

## 2015-10-11 MED ORDER — ASPIRIN EC 81 MG PO TBEC
81.0000 mg | DELAYED_RELEASE_TABLET | Freq: Every day | ORAL | Status: DC
  Administered 2015-10-11: 81 mg via ORAL
  Filled 2015-10-11: qty 1

## 2015-10-11 MED ORDER — WARFARIN SODIUM 5 MG PO TABS
5.0000 mg | ORAL_TABLET | Freq: Once | ORAL | Status: AC
  Administered 2015-10-11: 5 mg via ORAL
  Filled 2015-10-11: qty 1

## 2015-10-11 MED ORDER — BISACODYL 10 MG RE SUPP: 10.0000 mg | Freq: Every day | RECTAL | Status: DC | PRN

## 2015-10-11 MED ORDER — SODIUM CHLORIDE 0.9 % IV SOLN: 250.0000 mL | INTRAVENOUS | Status: DC | PRN

## 2015-10-11 MED ORDER — FUROSEMIDE 40 MG PO TABS
40.0000 mg | ORAL_TABLET | Freq: Every day | ORAL | Status: AC
  Administered 2015-10-11 – 2015-10-13 (×3): 40 mg via ORAL
  Filled 2015-10-11 (×3): qty 1

## 2015-10-11 MED ORDER — SODIUM CHLORIDE 0.9% FLUSH: 3.0000 mL | INTRAVENOUS | Status: DC | PRN

## 2015-10-11 MED ORDER — MOVING RIGHT ALONG BOOK
Freq: Once | Status: AC
  Administered 2015-10-11: 15:00:00
  Filled 2015-10-11: qty 1

## 2015-10-11 MED ORDER — INSULIN ASPART 100 UNIT/ML ~~LOC~~ SOLN
0.0000 [IU] | Freq: Three times a day (TID) | SUBCUTANEOUS | Status: DC
  Administered 2015-10-11 (×2): 4 [IU] via SUBCUTANEOUS
  Administered 2015-10-12: 8 [IU] via SUBCUTANEOUS
  Administered 2015-10-12: 4 [IU] via SUBCUTANEOUS
  Administered 2015-10-12: 2 [IU] via SUBCUTANEOUS
  Administered 2015-10-12 – 2015-10-13 (×2): 4 [IU] via SUBCUTANEOUS
  Administered 2015-10-13: 8 [IU] via SUBCUTANEOUS
  Administered 2015-10-13: 2 [IU] via SUBCUTANEOUS
  Administered 2015-10-14: 4 [IU] via SUBCUTANEOUS
  Administered 2015-10-14 (×2): 2 [IU] via SUBCUTANEOUS
  Administered 2015-10-15: 4 [IU] via SUBCUTANEOUS
  Administered 2015-10-15: 2 [IU] via SUBCUTANEOUS

## 2015-10-11 MED ORDER — TRAMADOL HCL 50 MG PO TABS: 50.0000 mg | ORAL_TABLET | ORAL | Status: DC | PRN

## 2015-10-11 MED ORDER — POTASSIUM CHLORIDE CRYS ER 20 MEQ PO TBCR
20.0000 meq | EXTENDED_RELEASE_TABLET | Freq: Two times a day (BID) | ORAL | Status: AC
Start: 2015-10-11 — End: 2015-10-13
  Administered 2015-10-11 – 2015-10-13 (×6): 20 meq via ORAL
  Filled 2015-10-11 (×6): qty 1

## 2015-10-11 MED ORDER — DOCUSATE SODIUM 100 MG PO CAPS
200.0000 mg | ORAL_CAPSULE | Freq: Every day | ORAL | Status: DC
  Administered 2015-10-13 – 2015-10-15 (×3): 200 mg via ORAL
  Filled 2015-10-11 (×5): qty 2

## 2015-10-11 MED ORDER — ASPIRIN 81 MG PO CHEW: 81.0000 mg | CHEWABLE_TABLET | Freq: Every day | ORAL | Status: DC

## 2015-10-11 MED ORDER — ASPIRIN EC 81 MG PO TBEC
81.0000 mg | DELAYED_RELEASE_TABLET | Freq: Every day | ORAL | Status: DC
  Administered 2015-10-12 – 2015-10-17 (×6): 81 mg via ORAL
  Filled 2015-10-11 (×6): qty 1

## 2015-10-11 MED ORDER — BISACODYL 5 MG PO TBEC: 10.0000 mg | DELAYED_RELEASE_TABLET | Freq: Every day | ORAL | Status: DC | PRN

## 2015-10-11 MED ORDER — SODIUM CHLORIDE 0.9% FLUSH
3.0000 mL | Freq: Two times a day (BID) | INTRAVENOUS | Status: DC
  Administered 2015-10-12 – 2015-10-16 (×6): 3 mL via INTRAVENOUS

## 2015-10-11 MED ORDER — FAMOTIDINE 20 MG PO TABS
20.0000 mg | ORAL_TABLET | Freq: Two times a day (BID) | ORAL | Status: DC
  Administered 2015-10-11 – 2015-10-16 (×8): 20 mg via ORAL
  Filled 2015-10-11 (×10): qty 1

## 2015-10-11 MED ORDER — OXYCODONE HCL 5 MG PO TABS
5.0000 mg | ORAL_TABLET | ORAL | Status: DC | PRN
  Administered 2015-10-11 – 2015-10-12 (×4): 10 mg via ORAL
  Administered 2015-10-13 – 2015-10-14 (×5): 5 mg via ORAL
  Filled 2015-10-11: qty 2
  Filled 2015-10-11 (×2): qty 1
  Filled 2015-10-11: qty 2
  Filled 2015-10-11: qty 1
  Filled 2015-10-11 (×3): qty 2
  Filled 2015-10-11: qty 1

## 2015-10-11 MED ORDER — ONDANSETRON HCL 4 MG/2ML IJ SOLN: 4.0000 mg | Freq: Four times a day (QID) | INTRAMUSCULAR | Status: DC | PRN

## 2015-10-11 MED ORDER — WARFARIN - PHYSICIAN DOSING INPATIENT
Freq: Every day | Status: DC
  Administered 2015-10-13 – 2015-10-16 (×3)

## 2015-10-11 MED ORDER — ACETAMINOPHEN 325 MG PO TABS: 650.0000 mg | ORAL_TABLET | Freq: Four times a day (QID) | ORAL | Status: DC | PRN

## 2015-10-11 MED ORDER — ONDANSETRON HCL 4 MG PO TABS: 4.0000 mg | ORAL_TABLET | Freq: Four times a day (QID) | ORAL | Status: DC | PRN

## 2015-10-11 NOTE — Progress Notes (Signed)
2 Days Post-Op Procedure(s) (LRB): BENTALL PROCEDURE (using a St Jude mechanical valve, size 23) (N/A) TRANSESOPHAGEAL ECHOCARDIOGRAM (TEE) (N/A) Subjective: No complaints, slept fairly well. No pain.  Objective: Vital signs in last 24 hours: Temp:  [97.5 F (36.4 C)-98.8 F (37.1 C)] 97.9 F (36.6 C) (01/28 0802) Pulse Rate:  [78-93] 93 (01/28 0754) Cardiac Rhythm:  [-] Normal sinus rhythm (01/28 0400) Resp:  [4-24] 12 (01/28 0600) BP: (98-130)/(63-85) 116/81 mmHg (01/28 0754) SpO2:  [89 %-100 %] 90 % (01/28 0600) Arterial Line BP: (84-146)/(60-128) 90/67 mmHg (01/27 1500) Weight:  [88.5 kg (195 lb 1.7 oz)] 88.5 kg (195 lb 1.7 oz) (01/28 0500)  Hemodynamic parameters for last 24 hours: PAP: (27-38)/(12-19) 36/16 mmHg  Intake/Output from previous day: 01/27 0701 - 01/28 0700 In: 2747.7 [P.O.:2120; I.V.:227.7; IV Piggyback:400] Out: 1610 [RUEAV:4098; Chest Tube:170] Intake/Output this shift:    General appearance: alert and cooperative Neurologic: intact Heart: regular rate and rhythm and crisp mechanical valve click Lungs: clear to auscultation bilaterally Extremities: edema mild Wound: dressing dry  Lab Results:  Recent Labs  10/10/15 1520 10/10/15 1531 10/11/15 0415  WBC 12.0*  --  13.3*  HGB 10.6* 12.6* 10.8*  HCT 31.1* 37.0* 30.5*  PLT 140*  --  142*   BMET:  Recent Labs  10/10/15 0415  10/10/15 1531 10/11/15 0415  NA 142  --  136 137  K 4.1  --  4.2 4.2  CL 112*  --  102 105  CO2 24  --   --  28  GLUCOSE 115*  --  232* 135*  BUN 9  --  9 9  CREATININE 0.70  < > 0.70 0.77  CALCIUM 7.6*  --   --  8.1*  < > = values in this interval not displayed.  PT/INR:  Recent Labs  10/11/15 0415  LABPROT 16.3*  INR 1.30   ABG    Component Value Date/Time   PHART 7.339* 10/09/2015 2133   HCO3 23.4 10/09/2015 2133   TCO2 21 10/10/2015 1531   ACIDBASEDEF 3.0* 10/09/2015 2133   O2SAT 98.0 10/09/2015 2133   CBG (last 3)   Recent Labs  10/10/15 1952  10/10/15 2351 10/11/15 0352  GLUCAP 224* 150* 111*   CLINICAL DATA: Status post aortic valve replacement  EXAM: PORTABLE CHEST 1 VIEW  COMPARISON: 10/10/2015  FINDINGS: Cardiac shadow is mildly enlarged but stable. There is been interval removal of the Swan-Ganz catheter and mediastinal drains. Bibasilar atelectatic changes are noted and slightly increased from the prior exam. No pneumothorax is same. A right jugular sheath remains in place.  IMPRESSION: Interval removal of tubes and lines as described.  Slight increase in bibasilar atelectatic changes.   Electronically Signed  By: Alcide Clever M.D.  On: 10/11/2015 07:34  Assessment/Plan: S/P Procedure(s) (LRB): BENTALL PROCEDURE (using a St Jude mechanical valve, size 23) (N/A) TRANSESOPHAGEAL ECHOCARDIOGRAM (TEE) (N/A) Mobilize, IS Diuresis: diuresed well yesterday. Weight is 5 lbs over preop. Diabetes control: continue Levemir and SSI. Transition to oral meds once eating well. Start coumadin today Plan for transfer to 2W see transfer orders   LOS: 2 days    Alleen Borne 10/11/2015

## 2015-10-11 NOTE — Progress Notes (Signed)
Pt oriented to unit and equipment. Initial vital signs taken and stable. Pt comfortable, denies pain, call light within reach.  Leonidas Romberg, RN

## 2015-10-12 LAB — PROTIME-INR
INR: 1.19 (ref 0.00–1.49)
Prothrombin Time: 15.3 seconds — ABNORMAL HIGH (ref 11.6–15.2)

## 2015-10-12 LAB — GLUCOSE, CAPILLARY
Glucose-Capillary: 137 mg/dL — ABNORMAL HIGH (ref 65–99)
Glucose-Capillary: 191 mg/dL — ABNORMAL HIGH (ref 65–99)
Glucose-Capillary: 176 mg/dL — ABNORMAL HIGH (ref 65–99)
Glucose-Capillary: 208 mg/dL — ABNORMAL HIGH (ref 65–99)

## 2015-10-12 MED ORDER — COUMADIN BOOK
Freq: Once | Status: AC
  Administered 2015-10-12: 13:00:00
  Filled 2015-10-12: qty 1

## 2015-10-12 MED ORDER — WARFARIN VIDEO
Freq: Once | Status: AC
  Administered 2015-10-12: 13:00:00

## 2015-10-12 MED ORDER — WARFARIN SODIUM 5 MG PO TABS
5.0000 mg | ORAL_TABLET | Freq: Once | ORAL | Status: AC
  Administered 2015-10-12: 5 mg via ORAL
  Filled 2015-10-12: qty 1

## 2015-10-12 NOTE — Progress Notes (Addendum)
[address]       [place] [address]             [phone]      3 Days Post-Op Procedure(s) (LRB): BENTALL PROCEDURE (using a St Jude mechanical valve, size 23) (N/A) TRANSESOPHAGEAL ECHOCARDIOGRAM (TEE) (N/A) Subjective: Feels well  Objective: Vital signs in last 24 hours: Temp:  [97.7 F (36.5 C)-98.1 F (36.7 C)] 97.8 F (36.6 C) (01/29 0538) Pulse Rate:  [95-101] 98 (01/29 0538) Cardiac Rhythm:  [-] Normal sinus rhythm (01/29 0715) Resp:  [9-25] 18 (01/29 0538) BP: (106-117)/(68-80) 115/69 mmHg (01/29 0538) SpO2:  [93 %-96 %] 95 % (01/29 0538) Weight:  [198 lb 3.2 oz (89.903 kg)] 198 lb 3.2 oz (89.903 kg) (01/29 0538)  Hemodynamic parameters for last 24 hours:    Intake/Output from previous day: 01/28 0701 - 01/29 0700 In: 480 [P.O.:480] Out: 185 [Urine:185] Intake/Output this shift:    General appearance: alert, cooperative and no distress Heart: regular rate and rhythm, S1, S2 normal and no murmur or rub Lungs: min dim in bases Abdomen: benign Extremities: minor LE eddema Wound: dressing CDI  Lab Results:  Recent Labs  10/10/15 1520 10/10/15 1531 10/11/15 0415  WBC 12.0*  --  13.3*  HGB 10.6* 12.6* 10.8*  HCT 31.1* 37.0* 30.5*  PLT 140*  --  142*   BMET:  Recent Labs  10/10/15 0415  10/10/15 1531 10/11/15 0415  NA 142  --  136 137  K 4.1  --  4.2 4.2  CL 112*  --  102 105  CO2 24  --   --  28  GLUCOSE 115*  --  232* 135*  BUN 9  --  9 9  CREATININE 0.70  < > 0.70 0.77  CALCIUM 7.6*  --   --  8.1*  < > = values in this interval not displayed.  PT/INR:  Recent Labs  10/12/15 0358  LABPROT 15.3*  INR 1.19   ABG    Component Value Date/Time   PHART 7.339* 10/09/2015 2133   HCO3 23.4 10/09/2015 2133   TCO2 21 10/10/2015 1531   ACIDBASEDEF 3.0* 10/09/2015 2133   O2SAT 98.0 10/09/2015 2133   CBG (last 3)   Recent Labs  10/11/15 1616 10/11/15 2116 10/12/15 0559  GLUCAP 177* 198* 137*     Meds Scheduled Meds: . antiseptic oral rinse  7 mL Mouth Rinse BID  . aspirin EC  81 mg Oral Daily  . atorvastatin  80 mg Oral q1800  . carvedilol  3.125 mg Oral BID WC  . Chlorhexidine Gluconate Cloth  6 each Topical Daily  . docusate sodium  200 mg Oral Daily  . famotidine  20 mg Oral BID  . fenofibrate  160 mg Oral Daily  . furosemide  40 mg Oral Daily  . insulin aspart  0-24 Units Subcutaneous TID AC & HS  . insulin detemir  20 Units Subcutaneous Daily  . mupirocin ointment  1 application Nasal BID  . potassium chloride  20 mEq Oral BID  . sodium chloride flush  3 mL Intravenous Q12H  . Warfarin - Physician Dosing Inpatient   Does not apply q1800   Continuous Infusions:  PRN Meds:.sodium chloride, acetaminophen, bisacodyl **OR** bisacodyl, ondansetron **OR** ondansetron (ZOFRAN) IV, oxyCODONE, sodium chloride flush, traMADol  Xrays Dg Chest Port 1 View  10/11/2015  CLINICAL DATA:  Status post aortic valve replacement EXAM: PORTABLE CHEST 1 VIEW COMPARISON:  10/10/2015 FINDINGS: Cardiac  shadow is mildly enlarged but stable. There is been interval removal of the Swan-Ganz catheter and mediastinal drains. Bibasilar atelectatic changes are noted and slightly increased from the prior exam. No pneumothorax is same. A right jugular sheath remains in place. IMPRESSION: Interval removal of tubes and lines as described. Slight increase in bibasilar atelectatic changes. Electronically Signed   By: Alcide Clever M.D.   On: 10/11/2015 07:34    Assessment/Plan: S/P Procedure(s) (LRB): BENTALL PROCEDURE (using a St Jude mechanical valve, size 23) (N/A) TRANSESOPHAGEAL ECHOCARDIOGRAM (TEE) (N/A)  1 conts with excellent progress 2 started on coumadin yesterday, cont 5 mg today 3 cont insulin - HGB A1C 8.9- will need to transition back to glucophage/glucotrol home meds vs insulin at home  4 hemodyn stable in sinus rhythm 5 cont diuresis for volume overload-mild 6 repeat labs in am  LOS: 3  days    GOLD,WAYNE E 10/12/2015   Chart reviewed, patient examined, agree with above. He looks great. Home when INR 2

## 2015-10-12 NOTE — Discharge Instructions (Addendum)
Information on my medicine - Coumadin   (Warfarin)  This medication education was reviewed with me or my healthcare representative as part of my discharge preparation.  The pharmacist that spoke with me during my hospital stay was:  Deboraha Sprang, [facility]  Why was Coumadin prescribed for you? Coumadin was prescribed for you because you have a blood clot or a medical condition that can cause an increased risk of forming blood clots. Blood clots can cause serious health problems by blocking the flow of blood to the heart, lung, or brain. Coumadin can prevent harmful blood clots from forming. As a reminder your indication for Coumadin is:   Blood Clot Prevention After Heart Valve Surgery  What test will check on my response to Coumadin? While on Coumadin (warfarin) you will need to have an INR test regularly to ensure that your dose is keeping you in the desired range. The INR (international normalized ratio) number is calculated from the result of the laboratory test called prothrombin time (PT).  If an INR APPOINTMENT HAS NOT ALREADY BEEN MADE FOR YOU please schedule an appointment to have this lab work done by your health care provider within 7 days. Your INR goal is usually a number between:  2 to 3 or your provider may give you a more narrow range like 2-2.5.  Ask your health care provider during an office visit what your goal INR is.  What  do you need to  know  About  COUMADIN? Take Coumadin (warfarin) exactly as prescribed by your healthcare provider about the same time each day.  DO NOT stop taking without talking to the doctor who prescribed the medication.  Stopping without other blood clot prevention medication to take the place of Coumadin may increase your risk of developing a new clot or stroke.  Get refills before you run out.  What do you do if you miss a dose? If you miss a dose, take it as soon as you remember on the same day then continue your regularly scheduled regimen the  next day.  Do not take two doses of Coumadin at the same time.  Important Safety Information A possible side effect of Coumadin (Warfarin) is an increased risk of bleeding. You should call your healthcare provider right away if you experience any of the following: ? Bleeding from an injury or your nose that does not stop. ? Unusual colored urine (red or dark brown) or unusual colored stools (red or black). ? Unusual bruising for unknown reasons. ? A serious fall or if you hit your head (even if there is no bleeding).  Some foods or medicines interact with Coumadin (warfarin) and might alter your response to warfarin. To help avoid this: ? Eat a balanced diet, maintaining a consistent amount of Vitamin K. ? Notify your provider about major diet changes you plan to make. ? Avoid alcohol or limit your intake to 1 drink for women and 2 drinks for men per day. (1 drink is 5 oz. wine, 12 oz. beer, or 1.5 oz. liquor.)  Make sure that ANY health care provider who prescribes medication for you knows that you are taking Coumadin (warfarin).  Also make sure the healthcare provider who is monitoring your Coumadin knows when you have started a new medication including herbals and non-prescription products.  Coumadin (Warfarin)  Major Drug Interactions  Increased Warfarin Effect Decreased Warfarin Effect  Alcohol (large quantities) Antibiotics (esp. Septra/Bactrim, Flagyl, Cipro) Amiodarone (Cordarone) Aspirin (ASA) Cimetidine (Tagamet) Megestrol (Megace) NSAIDs (  ibuprofen, naproxen, etc.) Piroxicam (Feldene) Propafenone (Rythmol SR) Propranolol (Inderal) Isoniazid (INH) Posaconazole (Noxafil) Barbiturates (Phenobarbital) Carbamazepine (Tegretol) Chlordiazepoxide (Librium) Cholestyramine (Questran) Griseofulvin Oral Contraceptives Rifampin Sucralfate (Carafate) Vitamin K   Coumadin (Warfarin) Major Herbal Interactions  Increased Warfarin Effect Decreased Warfarin Effect   Garlic Ginseng Ginkgo biloba Coenzyme Q10 Green tea St. Johns wort    Coumadin (Warfarin) FOOD Interactions  Eat a consistent number of servings per week of foods HIGH in Vitamin K (1 serving =  cup)  Collards (cooked, or boiled & drained) Kale (cooked, or boiled & drained) Mustard greens (cooked, or boiled & drained) Parsley *serving size only =  cup Spinach (cooked, or boiled & drained) Swiss chard (cooked, or boiled & drained) Turnip greens (cooked, or boiled & drained)  Eat a consistent number of servings per week of foods MEDIUM-HIGH in Vitamin K (1 serving = 1 cup)  Asparagus (cooked, or boiled & drained) Broccoli (cooked, boiled & drained, or raw & chopped) Brussel sprouts (cooked, or boiled & drained) *serving size only =  cup Lettuce, raw (green leaf, endive, romaine) Spinach, raw Turnip greens, raw & chopped   These websites have more information on Coumadin (warfarin):  FailFactory.se; VeganReport.com.au;  Aortic Valve Replacement, Care After Refer to this sheet in the next few weeks. These instructions provide you with information on caring for yourself after your procedure. Your health care provider may also give you specific instructions. Your treatment has been planned according to current medical practices, but problems sometimes occur. Call your health care provider if you have any problems or questions after your procedure. HOME CARE INSTRUCTIONS   Take medicines only as directed by your health care provider.  If your health care provider has prescribed elastic stockings, wear them as directed.  Take frequent naps or rest often throughout the day.  Avoid lifting over 10 lbs (4.5 kg) or pushing or pulling things with your arms for 6-8 weeks or as directed by your health care provider.  Avoid driving or airplane travel for 4-6 weeks after surgery or as directed by your health care provider. If you are riding in a car for an extended  period, stop every 1-2 hours to stretch your legs. Keep a record of your medicines and medical history with you when traveling.  Do not drive or operate heavy machinery while taking pain medicine. (narcotics).  Do not cross your legs.  Do not use any tobacco products including cigarettes, chewing tobacco, or electronic cigarettes. If you need help quitting, ask your health care provider.  Do not take baths, swim, or use a hot tub until your health care provider approves. Take showers once your health care provider approves. Pat incisions dry. Do not rub incisions with a washcloth or towel.  Avoid climbing stairs and using the handrail to pull yourself up for the first 2-3 weeks after surgery.  Return to work as directed by your health care provider.  Drink enough fluid to keep your urine clear or pale yellow.  Do not strain to have a bowel movement. Eat high-fiber foods if you become constipated. You may also take a medicine to help you have a bowel movement (laxative) as directed by your health care provider.  Resume sexual activity as directed by your health care provider. Men should not use medicines for erectile dysfunction until their doctor says it isokay.  If you had a certain type of heart condition in the past, you may need to take antibiotic medicine before having dental work or  surgery. Let your dentist and health care providers know if you had one or more of the following:  Previous endocarditis.  An artificial (prosthetic) heart valve.  Congenital heart disease. SEEK MEDICAL CARE IF:  You develop a skin rash.   You experience sudden changes in your weight.  You have a fever. SEEK IMMEDIATE MEDICAL CARE IF:   You develop chest pain that is not coming from your incision.  You have drainage (pus), redness, swelling, or pain at your incision site.   You develop shortness of breath or have difficulty breathing.   You have increased bleeding from your incision  site.   You develop light-headedness.  MAKE SURE YOU:   Understand these directions.  Will watch your condition.  Will get help right away if you are not doing well or get worse.   This information is not intended to replace advice given to you by your health care provider. Make sure you discuss any questions you have with your health care provider.   Document Released: 03/18/2005 Document Revised: 09/20/2014 Document Reviewed: 06/13/2012 Elsevier Interactive Patient Education [employer].

## 2015-10-13 LAB — BASIC METABOLIC PANEL
Anion gap: 7 (ref 5–15)
BUN: 12 mg/dL (ref 6–20)
CO2: 29 mmol/L (ref 22–32)
Calcium: 9.1 mg/dL (ref 8.9–10.3)
Chloride: 99 mmol/L — ABNORMAL LOW (ref 101–111)
Creatinine, Ser: 0.93 mg/dL (ref 0.61–1.24)
GFR calc Af Amer: >60 mL/min (ref >60)
GFR calc non Af Amer: >60 mL/min (ref >60)
Glucose, Bld: 156 mg/dL — ABNORMAL HIGH (ref 65–99)
Potassium: 4.3 mmol/L (ref 3.5–5.1)
Sodium: 135 mmol/L (ref 135–145)

## 2015-10-13 LAB — GLUCOSE, CAPILLARY
Glucose-Capillary: 142 mg/dL — ABNORMAL HIGH (ref 65–99)
Glucose-Capillary: 215 mg/dL — ABNORMAL HIGH (ref 65–99)
Glucose-Capillary: 181 mg/dL — ABNORMAL HIGH (ref 65–99)
Glucose-Capillary: 107 mg/dL — ABNORMAL HIGH (ref 65–99)

## 2015-10-13 LAB — CBC
HCT: 34.4 % — ABNORMAL LOW (ref 39.0–52.0)
Hemoglobin: 12 g/dL — ABNORMAL LOW (ref 13.0–17.0)
MCH: 30.2 pg (ref 26.0–34.0)
MCHC: 34.9 g/dL (ref 30.0–36.0)
MCV: 86.6 fL (ref 78.0–100.0)
Platelets: 264 10*3/uL (ref 150–400)
RBC: 3.97 MIL/uL — ABNORMAL LOW (ref 4.22–5.81)
RDW: 12.9 % (ref 11.5–15.5)
WBC: 10.9 10*3/uL — ABNORMAL HIGH (ref 4.0–10.5)

## 2015-10-13 LAB — PROTIME-INR
INR: 1.12 (ref 0.00–1.49)
Prothrombin Time: 14.6 seconds (ref 11.6–15.2)

## 2015-10-13 MED ORDER — LACTULOSE 10 GM/15ML PO SOLN
20.0000 g | Freq: Once | ORAL | Status: AC
  Administered 2015-10-13: 20 g via ORAL
  Filled 2015-10-13: qty 30

## 2015-10-13 MED ORDER — METFORMIN HCL 500 MG PO TABS
1000.0000 mg | ORAL_TABLET | Freq: Two times a day (BID) | ORAL | Status: DC
  Administered 2015-10-13 – 2015-10-17 (×9): 1000 mg via ORAL
  Filled 2015-10-13 (×9): qty 2

## 2015-10-13 MED ORDER — WARFARIN SODIUM 7.5 MG PO TABS
7.5000 mg | ORAL_TABLET | Freq: Every day | ORAL | Status: DC
  Administered 2015-10-13: 7.5 mg via ORAL
  Filled 2015-10-13: qty 1

## 2015-10-13 MED ORDER — GLIPIZIDE ER 5 MG PO TB24
5.0000 mg | ORAL_TABLET | Freq: Every day | ORAL | Status: DC
Start: 2015-10-13 — End: 2015-10-17
  Administered 2015-10-13 – 2015-10-17 (×5): 5 mg via ORAL
  Filled 2015-10-13 (×6): qty 1

## 2015-10-13 NOTE — Progress Notes (Signed)
10/13/2015 4:49 PM Pt has walked around unit with rolling walker on RA a couple times a day.  Wife walked with him.  Pt tolerated well. Kathryne Hitch

## 2015-10-13 NOTE — Progress Notes (Signed)
10/13/2015 8:44 AM EPW D/C'd per order and per protocol.  Ends intact. Pt. Tolerated well.  Advised bedrest x1hr.  Call bell in reach.  Vital signs collected per protocol. Kathryne Hitch

## 2015-10-13 NOTE — Progress Notes (Addendum)
      [address]       [place] [address]             [phone]        4 Days Post-Op Procedure(s) (LRB): BENTALL PROCEDURE (using a St Jude mechanical valve, size 23) (N/A) TRANSESOPHAGEAL ECHOCARDIOGRAM (TEE) (N/A)  Subjective: Patient without bowel movement.  Objective: Vital signs in last 24 hours: Temp:  [98 F (36.7 C)-98.7 F (37.1 C)] 98 F (36.7 C) (01/30 0437) Pulse Rate:  [96-101] 96 (01/30 0437) Cardiac Rhythm:  [-] Normal sinus rhythm (01/29 1900) Resp:  [16-18] 16 (01/30 0437) BP: (107-123)/(65-74) 107/71 mmHg (01/30 0437) SpO2:  [96 %-98 %] 98 % (01/30 0437) Weight:  [192 lb 12.8 oz (87.454 kg)] 192 lb 12.8 oz (87.454 kg) (01/30 0437)  Pre op weight 86 kg Current Weight  10/13/15 192 lb 12.8 oz (87.454 kg)       Intake/Output from previous day: 01/29 0701 - 01/30 0700 In: 600 [P.O.:600] Out: -    Physical Exam:  Cardiovascular: RRR, no murmur, sharp valve click Pulmonary: Slightly diminished at bases bilaterally; no rales, wheezes, or rhonchi. Abdomen: Soft, non tender, bowel sounds present. Extremities: Mild bilateral lower extremity edema. Wounds: Clean and dry.  No erythema or signs of infection.  Lab Results: CBC: Recent Labs  10/11/15 0415 10/13/15 0449  WBC 13.3* 10.9*  HGB 10.8* 12.0*  HCT 30.5* 34.4*  PLT 142* 264   BMET:  Recent Labs  10/11/15 0415 10/13/15 0449  NA 137 135  K 4.2 4.3  CL 105 99*  CO2 28 29  GLUCOSE 135* 156*  BUN 9 12  CREATININE 0.77 0.93  CALCIUM 8.1* 9.1    PT/INR:  Lab Results  Component Value Date   INR 1.12 10/13/2015   INR 1.19 10/12/2015   INR 1.30 10/11/2015   ABG:  INR: Will add last result for INR, ABG once components are confirmed Will add last 4 CBG results once components are confirmed  Assessment/Plan:  1. CV - SR in the 90's. On Coreg 3.125 mg bid and Coumadin. INR decreased from 1.19 to 1.12 and has been given 2 doses of 5 mg. Will increase Coumadin  to 7.5 mg tonight as need INR at least 2 as has mechanical valve. 2.  Pulmonary - On room air. Encourage incentive spirometer. 3. Volume Overload - On Lasix 40 mg daily 4.  Acute blood loss anemia - H and H stable at 12 and 34.4 5. DM-CBGs 176/208/142. On Insulin. Will restart Glipizide XL 5 mg and Metformin as taken pre op. Stop scheduled Insulin. Pre op HGA1C 8.9. He will need to follow up with his medical doctor after discharge. 6. Remove EPW 7. LOC constipation  ZIMMERMAN,DONIELLE MPA-C 10/13/2015,8:05 AM   Chart reviewed, patient examined, agree with above. If he does not bump tomorrow I would give him 10 mg.

## 2015-10-13 NOTE — Care Management Note (Signed)
Case Management Note  Patient Details  Name: Dennis Bates MRN: [id number] Date of Birth: 08/01/1961  Subjective/Objective:     Pt is 4 days status post Bentall procedure               Action/Plan:  Pt is independent from home with wife.  Wife will take time off of work at discharge to be with pt.  CM will continue to monitor for disposition needs   Expected Discharge Date:                  Expected Discharge Plan:  Home/Self Care  In-House Referral:     Discharge planning Services  CM Consult  Post Acute Care Choice:    Choice offered to:     DME Arranged:    DME Agency:     HH Arranged:    HH Agency:     Status of Service:  In process, will continue to follow  Medicare Important Message Given:    Date Medicare IM Given:    Medicare IM give by:    Date Additional Medicare IM Given:    Additional Medicare Important Message give by:     If discussed at Long Length of Stay Meetings, dates discussed:    Additional Comments:  Cherylann Parr, RN 10/13/2015, 12:20 PM

## 2015-10-13 NOTE — Progress Notes (Signed)
CARDIAC REHAB PHASE I   PRE:  Rate/Rhythm: 107 ST  BP:  Supine:   Sitting: 104/70  Standing:    SaO2: 98%RA  MODE:  Ambulation: 550 ft   POST:  Rate/Rhythm: 104  BP:  Supine:   Sitting: 120/78  Standing:    SaO2: 98%RA 1040-1105 Pt up at sink shaving. Willing to walk. Walked 550 ft on RA with rolling walker with steady gait. Tolerated well. Very motivated.   Luetta Nutting, RN BSN  10/13/2015 11:02 AM

## 2015-10-14 LAB — PROTIME-INR
INR: 1.15 (ref 0.00–1.49)
Prothrombin Time: 14.9 seconds (ref 11.6–15.2)

## 2015-10-14 LAB — GLUCOSE, CAPILLARY
Glucose-Capillary: 135 mg/dL — ABNORMAL HIGH (ref 65–99)
Glucose-Capillary: 180 mg/dL — ABNORMAL HIGH (ref 65–99)
Glucose-Capillary: 144 mg/dL — ABNORMAL HIGH (ref 65–99)
Glucose-Capillary: 131 mg/dL — ABNORMAL HIGH (ref 65–99)

## 2015-10-14 MED ORDER — CARVEDILOL 6.25 MG PO TABS
6.2500 mg | ORAL_TABLET | Freq: Two times a day (BID) | ORAL | Status: DC
  Administered 2015-10-14 – 2015-10-15 (×2): 6.25 mg via ORAL
  Filled 2015-10-14 (×2): qty 1

## 2015-10-14 MED ORDER — WARFARIN SODIUM 10 MG PO TABS
10.0000 mg | ORAL_TABLET | Freq: Every day | ORAL | Status: DC
  Administered 2015-10-14 – 2015-10-16 (×3): 10 mg via ORAL
  Filled 2015-10-14 (×3): qty 1

## 2015-10-14 NOTE — Progress Notes (Signed)
UR Completed. Skylynne Schlechter, RN, BSN.  [phone] 

## 2015-10-14 NOTE — Progress Notes (Signed)
CARDIAC REHAB PHASE I   PRE:  Rate/Rhythm:     BP: sitting     SaO2:   MODE:  Ambulation: 1050 ft   POST:  Rate/Rhythm: 106 ST    BP: sitting 110/70     SaO2: 96 RA  Pt up walking with his wife numerous walks but shorter. I joined him, d/c'd his RW (steady), and increased distance. Tolerated well. Does not need to use RW. HR controlled at 106 ST. Pt admits to drinking caffeinated coffee this am. Encouraged decaf while in hospital. Discussed DM and watching videos today and reading guidelines. Also discussed CRPII and will send referral to [place]. Pt can walk independently. Will f/u tomorrow for the rest of ed. [phone]   Elissa Lovett Cidra CES, ACSM 10/14/2015 11:09 AM

## 2015-10-14 NOTE — Progress Notes (Addendum)
      [address]       [place] [address]             [phone]        5 Days Post-Op Procedure(s) (LRB): BENTALL PROCEDURE (using a St Jude mechanical valve, size 23) (N/A) TRANSESOPHAGEAL ECHOCARDIOGRAM (TEE) (N/A)  Subjective: Patient without bowel movement.  Objective: Vital signs in last 24 hours: Temp:  [97.5 F (36.4 C)-98 F (36.7 C)] 98 F (36.7 C) (01/31 0145) Pulse Rate:  [95-104] 95 (01/31 0145) Cardiac Rhythm:  [-] Sinus tachycardia (01/30 1900) Resp:  [20] 20 (01/31 0145) BP: (106-129)/(68-79) 129/73 mmHg (01/31 0145) SpO2:  [93 %-100 %] 98 % (01/31 0145) Weight:  [189 lb 9.5 oz (86 kg)] 189 lb 9.5 oz (86 kg) (01/31 0141)  Pre op weight 86 kg Current Weight  10/14/15 189 lb 9.5 oz (86 kg)       Intake/Output from previous day: 01/30 0701 - 01/31 0700 In: 483 [P.O.:480; I.V.:3] Out: 3 [Urine:1; Stool:2]   Physical Exam:  Cardiovascular: RRR, no murmur, sharp valve click Pulmonary: Slightly diminished at bases bilaterally; no rales, wheezes, or rhonchi. Abdomen: Soft, non tender, bowel sounds present. Extremities: Mild bilateral lower extremity edema. Wounds: Clean and dry.  No erythema or signs of infection.  Lab Results: CBC:  Recent Labs  10/13/15 0449  WBC 10.9*  HGB 12.0*  HCT 34.4*  PLT 264   BMET:   Recent Labs  10/13/15 0449  NA 135  K 4.3  CL 99*  CO2 29  GLUCOSE 156*  BUN 12  CREATININE 0.93  CALCIUM 9.1    PT/INR:  Lab Results  Component Value Date   INR 1.15 10/14/2015   INR 1.12 10/13/2015   INR 1.19 10/12/2015   ABG:  INR: Will add last result for INR, ABG once components are confirmed Will add last 4 CBG results once components are confirmed  Assessment/Plan:  1. CV - Tachycardic in the low 100's. On Coreg 3.125 mg bid and Coumadin. Will increase Coreg to 6.25 mg bid for better HR control.INR slightly increased from 1.12 to 1.15. Will continue with Coumadin to 7.5 mg tonight as  need INR at least 2 as has mechanical valve..  2.  Pulmonary - On room air. Encourage incentive spirometer. 3. Volume Overload - On Lasix 40 mg daily 4.  Acute blood loss anemia - H and H stable at 12 and 34.4 5. DM-CBGs 181/107/135. Will restart Glipizide XL 5 mg and continue Metformin as taken pre op.  Pre op HGA1C 8.9. He will need to follow up with his medical doctor after discharge. 6. Home when INR closer to therapeutic  ZIMMERMAN,DONIELLE MPA-C 10/14/2015,7:42 AM    Chart reviewed, patient examined, agree with above. His INR has not changed significantly after 5,5,7.5. Will give 10 mg tonight.

## 2015-10-14 NOTE — Discharge Summary (Signed)
Physician Discharge Summary       [address]       [place] [address]             [phone]    Patient ID: Dennis Bates MRN: [id number] DOB/AGE: 01-11-1961 55 y.o.  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 Hyperlipidemia 2. DM, type 2 3. Bell's palsy 4. CAD (s/p STEMI, s/p percutaneous coronary angioplasty and Promus Premier DES to the OM2 09/20/2014) 5. ABL anemia  Procedure (s):  1. Median Sternotomy 2. Extracorporeal circulation 3. Replacement of ascending aortic aneurysm using a 30 mm Hemashield graft under deep hypothermic circulatory arrest. 4. Bentall procedure using a 23 mm St. Jude Mechanical Master Series valved graft by Dr. Laneta Simmers on 10/09/2015.  History of Presenting Illness: The patient is a 55 year old gentleman with type 2 DM, hyperlipidemia, CAD s/p inferior STEMI and PCI/DES to a 99% OM2 09/2014, known bicuspid aortic valve stenosis. When he saw cardiology on 10/01/2014 he reported some mild exertional dyspnea with walks. About a month later he was evaluated for dizziness and near syncope that was similar to his prodromal symptoms at the time of his MI. He noted at least two episodes of dizziness and flushing with near syncope that occurred at rest. His last echo on 11/12/2014 showed severe AS with a peak velocity of 421 cm/sec corresponding to a valve area of 0.79 cm2. The ascending aorta was 46 mm. LVEF was 60-65%. Mean AV gradient was 41 mm Hg. DI was 0.2. Since then he has not had any further episodes of dizziness but continues to have exertional fatigue and shortness of breath. Since he was still on Effient post DES he was observed but was referred for surgical evaluation in July. He does report occasional episodes of left chest dull aching that is not always related to exertion. He did have some enlargement of his aorta on cath and I felt  that he would need AVR and probably replacement of his ascending aorta. He wanted to wait until after November so that he could get health insurance prior to surgery. He underwent R/L heart cath on 09/18/2015 which showed a widely patent LCx stent and mild luminal irregularities in the LAD and RCA. A follow up echo on 09/23/2015 showed a bicuspid aortic valve that was severely thickened and calcified with restricted leaflet mobility and a mean gradient of 42 mm Hg. The aortic root was measured at 42 mm. A CT angio of the chest on 09/24/2015 showed a fusiform ascending aortic aneurysm with a maximum diameter of 4.6 cm. The descending aorta is 2.4 cm. He has a bicuspid aortic valve with stage D severe symptomatic aortic stenosis and had PCI of a 99% OM2 lesion presenting with STEMI in Jan 2016. 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 His LCX stent is widely patent and there is no other significant coronary stenosis. Dr. Laneta Simmers discussed the  best treatment is a Bentall procedure to replace his valve and aorta using a mechanical valved graft. Potential risks, benefits, and complications were discussed with the patient and he agreed to proceed with surgery. Pre operative carotid duplex US showed no significant internal carotid artery stenosis bilaterally. He underwent a Bentall on 10/09/2015.  Brief Hospital Course:  The patient was extubated the evening of surgery without difficulty. He remained afebrile and hemodynamically stable. He  was weaned off of Neo Synephrine drip. Theone Murdoch, a line, chest tubes, and foley were removed early in the post operative course. Coreg was started and titrated Accordingly;however, it later had to be stopped secondary to labile blood pressure. He was started on Coumadin for his mechanical aortic valve. His last INR was up to 1.63 and he he will be continued on Coumadin 10 mg daily. Per Dr. Lavinia Sharps, will give Coumadin 12.5 mg  before discharge. He was volume over loaded and diuresed. He had ABL anemia. He did not require a post op transfusion. His last H and H was up to 12 and 34.4. He was weaned off the insulin drip.The patient's HGA1C pre op was 8.9. Once he was tolerating a diet, home diabetic medicines of Glipizide XL 5 mg and Metformin 1000 mg bid were restarted .  The patient's glucose remained well controlled. The patient was felt surgically stable for transfer from the ICU to [facility] for further convalescence on 10/12/2015. He continues to progress with cardiac rehab. He was ambulating on room air. He has been tolerating a diet and has had a bowel movement. Epicardial pacing wires were removed prior to discharge. Blood pressure has improved and he has been restarted on Coreg 3.125 mg bid. Chest tube sutures will be removed today. The patient is felt surgically stable for discharge today.  Latest Vital Signs: Blood pressure 113/70, pulse 95, temperature 98.1 F (36.7 C), temperature source Oral, resp. rate 18, height 5\' 7"  (1.702 m), weight 180 lb 11.2 oz (81.965 kg), SpO2 97 %.  Physical Exam: Cardiovascular: RRR, no murmur, sharp valve click Pulmonary: Slightly diminished at bases bilaterally; no rales, wheezes, or rhonchi. Abdomen: Soft, non tender, bowel sounds present. Extremities: Mild bilateral lower extremity edema. Wounds: Clean and dry. No erythema or signs of infection.  Discharge Condition:Stable and discharged to home  Recent laboratory studies:  Lab Results  Component Value Date   WBC 10.9* 10/13/2015   HGB 12.0* 10/13/2015   HCT 34.4* 10/13/2015   MCV 86.6 10/13/2015   PLT 264 10/13/2015   Lab Results  Component Value Date   NA 135 10/13/2015   K 4.3 10/13/2015   CL 99* 10/13/2015   CO2 29 10/13/2015   CREATININE 0.93 10/13/2015   GLUCOSE 156* 10/13/2015    Diagnostic Studies:  Ct Angio Chest W/cm &/or Wo Cm  09/24/2015  CLINICAL DATA:  55 year old male with history of aortic  stenosis. Shortness of breath for several months. EXAM: CT ANGIOGRAPHY CHEST WITH CONTRAST TECHNIQUE: Multidetector CT imaging of the chest was performed using the standard protocol during bolus administration of intravenous contrast. Multiplanar CT image reconstructions and MIPs were obtained to evaluate the vascular anatomy. CONTRAST:  100 mL of Isovue 370. COMPARISON:  No priors. FINDINGS: Creatinine was obtained on site at [facility] at [address] Results: Creatinine 0.7 mg/dL. Mediastinum/Lymph Nodes: Heart size is normal. There is no significant pericardial fluid, thickening or pericardial calcification. There is atherosclerosis of the thoracic aorta, the great vessels of the mediastinum and the coronary arteries, including calcified atherosclerotic plaque in the left circumflex coronary artery. Severe thickening and calcification of the aortic valve. Mild aneurysmal dilatation of the ascending thoracic aorta which measures up to 4.6 cm in diameter. The arch is normal in caliber measuring 2.9 cm in diameter just distal to the origin of the left subclavian artery. Descending thoracic aorta is also normal in caliber measuring 2.4 cm in diameter. No evidence of thoracic aortic dissection. Normal  three-vessel right-sided aortic arch. No pathologically enlarged mediastinal or hilar lymph nodes. Esophagus is unremarkable in appearance. No axillary lymphadenopathy. Lungs/Pleura: No suspicious appearing pulmonary nodules or masses. No acute consolidative airspace disease. No pleural effusions. No pneumothorax. Upper Abdomen: Diffuse low attenuation throughout the hepatic parenchyma, compatible with severe hepatic steatosis. Intermediate to high attenuation material layering dependently in the gallbladder, compatible with biliary sludge and/or noncalcified gallstones. No current findings to suggest an acute cholecystitis at this time. Musculoskeletal/Soft Tissues: There are no aggressive appearing  lytic or blastic lesions noted in the visualized portions of the skeleton. Review of the MIP images confirms the above findings. IMPRESSION: 1. Severely thickened and heavily calcified aortic valve, compatible with the reported clinical history of aortic stenosis. This is associated with mild aneurysmal dilatation of the ascending thoracic aorta which currently measures 4.6 cm in diameter. 2. Normal right sided 3 vessel aortic arch. 3. Atherosclerosis, including left circumflex coronary artery disease. Please note that although the presence of coronary artery calcium documents the presence of coronary artery disease, the severity of this disease and any potential stenosis cannot be assessed on this non-gated CT examination. Assessment for potential risk factor modification, dietary therapy or pharmacologic therapy may be warranted, if clinically indicated. 4. Severe hepatic steatosis. 5. Biliary sludge and/or noncalcified gallstones lying dependently in the gallbladder. No findings to suggest an acute cholecystitis at this time. Electronically Signed   By: Trudie Reed M.D.   On: 09/24/2015 14:47   Dg Chest Port 1 View  10/11/2015  CLINICAL DATA:  Status post aortic valve replacement EXAM: PORTABLE CHEST 1 VIEW COMPARISON:  10/10/2015 FINDINGS: Cardiac shadow is mildly enlarged but stable. There is been interval removal of the Swan-Ganz catheter and mediastinal drains. Bibasilar atelectatic changes are noted and slightly increased from the prior exam. No pneumothorax is same. A right jugular sheath remains in place. IMPRESSION: Interval removal of tubes and lines as described. Slight increase in bibasilar atelectatic changes. Electronically Signed   By: Alcide Clever M.D.    Discharge Medications:   Medication List    STOP taking these medications        nitroGLYCERIN 0.4 MG SL tablet  Commonly known as:  NITROSTAT     prasugrel 10 MG Tabs tablet  Commonly known as:  EFFIENT      TAKE these  medications        acetaminophen 325 MG tablet  Commonly known as:  TYLENOL  Take 2 tablets (650 mg total) by mouth every 6 (six) hours as needed for mild pain.     aspirin 81 MG EC tablet  Take 1 tablet (81 mg total) by mouth daily.     atorvastatin 80 MG tablet  Commonly known as:  LIPITOR  Take 1 tablet (80 mg total) by mouth daily.     carvedilol 3.125 MG tablet  Commonly known as:  COREG  Take 1 tablet (3.125 mg total) by mouth 2 (two) times daily with a meal.     fenofibrate 160 MG tablet  Take 1 tablet (160 mg total) by mouth daily.     glipiZIDE 5 MG 24 hr tablet  Commonly known as:  GLUCOTROL XL  Take 1 tablet (5 mg total) by mouth daily with breakfast.     metFORMIN 1000 MG tablet  Commonly known as:  GLUCOPHAGE  Take 1,000 mg by mouth 2 (two) times daily with a meal.     oxyCODONE 5 MG immediate release tablet  Commonly known as:  Oxy IR/ROXICODONE  Take 1-2 tablets (5-10 mg total) by mouth every 3 (three) hours as needed for severe pain.     warfarin 10 MG tablet  Commonly known as:  COUMADIN  Take 1 tablet (10 mg total) by mouth daily at 6 PM. Or as directed        Follow Up Appointments: Follow-up Information    Follow up with Alleen Borne, MD On 11/12/2015.   Specialty:  Cardiothoracic Surgery   Why:  PA/LAT CXR to be taken (at [facility] which is in the same building as Dr. Sharee Pimple office) on 11/12/2015 at 10:45 am;Appointment time is at 11:30 am   Contact information:   [address] [place] [address] [phone]       Follow up with [facility] [place] [facility] On 10/20/2015.   Specialty:  Cardiology   Why:  Call for an appointment to have PT and INR (as is on Coumadin for mechanical valve) drawn on Monday  10/20/2015   Contact information:   [address], [address] [place] [address] [phone]      Follow up with Theodore Demark On 10/27/2015.   Why:  Appointment time is at 2:30 pm    Contact information:   [address] [place] [address] [phone]      Follow up with Leanor Rubenstein, MD.   Specialty:  Family Medicine   Why:  Call for a follow up appointment for further diabetes management and surveillance of HGA1C 8.9   Contact information:   [address] [place] [address] [phone]       Signed: Doree Fudge MPA-C 10/17/2015, 9:08 AM

## 2015-10-15 LAB — GLUCOSE, CAPILLARY
Glucose-Capillary: 141 mg/dL — ABNORMAL HIGH (ref 65–99)
Glucose-Capillary: 167 mg/dL — ABNORMAL HIGH (ref 65–99)
Glucose-Capillary: 148 mg/dL — ABNORMAL HIGH (ref 65–99)

## 2015-10-15 LAB — PROTIME-INR
INR: 1.4 (ref 0.00–1.49)
Prothrombin Time: 17.3 seconds — ABNORMAL HIGH (ref 11.6–15.2)

## 2015-10-15 NOTE — Progress Notes (Signed)
CARDIAC REHAB PHASE I   Pt continues to ambulate independently with no complaints. Cardiac surgery discharge education completed with pt and wife at bedside. Reviewed IS, sternal precautions, activity progression, exercise guidelines, diet including heart healthy, carb counting, and sodium restrictions, daily weights and phase 2 cardiac rehab. Pt and wife verbalized understanding, asked many questions, receptive to education. Phase 2 cardiac rehab referral sent to [facility]. Pt up ad lib in room, wife at bedside.   [id number] Joylene Grapes, RN, BSN 10/15/2015 11:21 AM

## 2015-10-15 NOTE — Progress Notes (Signed)
Patient given support and encouragement with managing stress. Asked patient to let wife possibly put general post on facebook to relieve stress of well doers causing issues.  Patient comforted and feels better about situation. Pt resting with call bell within reach.  Will continue to monitor. Lochlann Hoff, RN

## 2015-10-15 NOTE — Progress Notes (Addendum)
      [address]       [place] [address]             [phone]        6 Days Post-Op Procedure(s) (LRB): BENTALL PROCEDURE (using a St Jude mechanical valve, size 23) (N/A) TRANSESOPHAGEAL ECHOCARDIOGRAM (TEE) (N/A)  Subjective: Patient   Objective: Vital signs in last 24 hours: Temp:  [98 F (36.7 C)-98.7 F (37.1 C)] 98 F (36.7 C) (02/01 0450) Pulse Rate:  [92-103] 92 (02/01 0450) Cardiac Rhythm:  [-] Normal sinus rhythm (01/31 1900) Resp:  [18-20] 18 (02/01 0450) BP: (100-109)/(59-69) 100/68 mmHg (02/01 0450) SpO2:  [97 %-98 %] 98 % (02/01 0450) Weight:  [187 lb 1.6 oz (84.868 kg)] 187 lb 1.6 oz (84.868 kg) (02/01 0450)  Pre op weight 86 kg Current Weight  10/15/15 187 lb 1.6 oz (84.868 kg)       Intake/Output from previous day: 01/31 0701 - 02/01 0700 In: 483 [P.O.:480; I.V.:3] Out: 3 [Urine:2; Stool:1]   Physical Exam:  Cardiovascular: RRR, no murmur, sharp valve click Pulmonary: Slightly diminished at bases bilaterally; no rales, wheezes, or rhonchi. Abdomen: Soft, non tender, bowel sounds present. Extremities: Mild bilateral lower extremity edema. Wounds: Clean and dry.  No erythema or signs of infection.  Lab Results: CBC:  Recent Labs  10/13/15 0449  WBC 10.9*  HGB 12.0*  HCT 34.4*  PLT 264   BMET:   Recent Labs  10/13/15 0449  NA 135  K 4.3  CL 99*  CO2 29  GLUCOSE 156*  BUN 12  CREATININE 0.93  CALCIUM 9.1    PT/INR:  Lab Results  Component Value Date   INR 1.40 10/15/2015   INR 1.15 10/14/2015   INR 1.12 10/13/2015   ABG:  INR: Will add last result for INR, ABG once components are confirmed Will add last 4 CBG results once components are confirmed  Assessment/Plan:  1. CV - Tachycardic in the low 100's. On Coreg 6.25 mg bid and Coumadin. INR increased from 1.15 to 1.4 with Coumadin 7.5 mg. He was given 10 mg last evening and will continue for tonight as INR needs to be at least 2 as has  mechanical valve.  2.  Pulmonary - On room air. Encourage incentive spirometer. 3. Volume Overload - On Lasix 40 mg daily. Will stop after this am dose as no LE edema and below pre op weight. 4.  Acute blood loss anemia - H and H stable at 12 and 34.4 5. DM-CBGs 144/131/141. Will restart Glipizide XL 5 mg and continue Metformin as taken pre op.  Pre op HGA1C 8.9. He will need to follow up with his medical doctor after discharge. 6. Home when INR closer to therapeutic  ZIMMERMAN,DONIELLE MPA-C 10/15/2015,7:49 AM    Chart reviewed, patient examined, agree with above. INR starting to rise. Will given 10 mg Coumadin tonight.  His Coreg has been held because his BP is in the 90's. His BP will probably rise over the next few weeks and the Coreg can be restarted. He was only on 3.125 bid.

## 2015-10-16 ENCOUNTER — Encounter (HOSPITAL_COMMUNITY): Payer: Self-pay | Admitting: Student

## 2015-10-16 LAB — PROTIME-INR
INR: 1.59 — ABNORMAL HIGH (ref 0.00–1.49)
Prothrombin Time: 19 s — ABNORMAL HIGH (ref 11.6–15.2)

## 2015-10-16 MED ORDER — ZOLPIDEM TARTRATE 5 MG PO TABS
5.0000 mg | ORAL_TABLET | Freq: Every day | ORAL | Status: DC
  Administered 2015-10-16: 5 mg via ORAL
  Filled 2015-10-16: qty 1

## 2015-10-16 NOTE — Progress Notes (Signed)
1155 Pt walking independently and education completed. We will sign off. Referring to [place] CRP 2. Luetta Nutting RN BSN 10/16/2015 11:52 AM

## 2015-10-16 NOTE — Progress Notes (Addendum)
      [address]       [place] [address]             [phone]        7 Days Post-Op Procedure(s) (LRB): BENTALL PROCEDURE (using a St Jude mechanical valve, size 23) (N/A) TRANSESOPHAGEAL ECHOCARDIOGRAM (TEE) (N/A)  Subjective: Patient's only complaint is not being able to sleep  Objective: Vital signs in last 24 hours: Temp:  [98.2 F (36.8 C)-98.4 F (36.9 C)] 98.4 F (36.9 C) (02/02 0422) Pulse Rate:  [88-99] 91 (02/02 0422) Cardiac Rhythm:  [-] Normal sinus rhythm;Bundle branch block (02/01 1900) Resp:  [18] 18 (02/02 0422) BP: (89-107)/(57-71) 107/66 mmHg (02/02 0422) SpO2:  [97 %-99 %] 97 % (02/02 0422) Weight:  [184 lb 3.2 oz (83.553 kg)] 184 lb 3.2 oz (83.553 kg) (02/02 0312)  Pre op weight 86 kg Current Weight  10/16/15 184 lb 3.2 oz (83.553 kg)      Intake/Output from previous day: 02/01 0701 - 02/02 0700 In: 840 [P.O.:840] Out: -    Physical Exam:  Cardiovascular: RRR, no murmur, sharp valve click Pulmonary: Mostly clear; no rales, wheezes, or rhonchi. Abdomen: Soft, non tender, bowel sounds present. Extremities: No lower extremity edema. Wounds: Clean and dry.  No erythema or signs of infection.  Lab Results: CBC: No results for input(s): WBC, HGB, HCT, PLT in the last 72 hours. BMET:  No results for input(s): NA, K, CL, CO2, GLUCOSE, BUN, CREATININE, CALCIUM in the last 72 hours.  PT/INR:  Lab Results  Component Value Date   INR 1.59* 10/16/2015   INR 1.40 10/15/2015   INR 1.15 10/14/2015   ABG:  INR: Will add last result for INR, ABG once components are confirmed Will add last 4 CBG results once components are confirmed  Assessment/Plan:  1. CV - SR in the 90's. Coreg stopped because of labile BP. On Coumadin. INR increased from 1.4 to 1.59. He was given 10 mg last 2 evenings so will continue. 2.  Pulmonary - On room air. Encourage incentive spirometer. 3.  Acute blood loss anemia - H and H stable at 12 and  34.4 4. DM-CBGs 141/167/148. Will restart Glipizide XL 5 mg and continue Metformin as taken pre op.  Pre op HGA1C 8.9. He will need to follow up with his medical doctor after discharge. 5. Ambien for sleep at patient's request 6. Home when INR closer to therapeutic  ZIMMERMAN,DONIELLE MPA-C 10/16/2015,7:24 AM   Chart reviewed, patient examined, agree with above. I think he should be able to go home tomorrow with INR follow up Monday. We will need to determine what dose of coumadin to send him home on.

## 2015-10-17 LAB — PROTIME-INR
INR: 1.63 — ABNORMAL HIGH (ref 0.00–1.49)
Prothrombin Time: 19.3 seconds — ABNORMAL HIGH (ref 11.6–15.2)

## 2015-10-17 MED ORDER — WARFARIN SODIUM 7.5 MG PO TABS: 15.0000 mg | ORAL_TABLET | Freq: Every day | ORAL | Status: DC

## 2015-10-17 MED ORDER — ACETAMINOPHEN 325 MG PO TABS: 650.0000 mg | ORAL_TABLET | Freq: Four times a day (QID) | ORAL | Status: AC | PRN

## 2015-10-17 MED ORDER — WARFARIN SODIUM 10 MG PO TABS
10.0000 mg | ORAL_TABLET | Freq: Every day | ORAL | Status: DC
Start: 2015-10-17 — End: 2015-10-17

## 2015-10-17 MED ORDER — OXYCODONE HCL 5 MG PO TABS: 5.0000 mg | ORAL_TABLET | ORAL | Status: DC | PRN

## 2015-10-17 MED ORDER — ZOLPIDEM TARTRATE 5 MG PO TABS: 2.5000 mg | ORAL_TABLET | Freq: Every evening | ORAL | Status: DC | PRN

## 2015-10-17 MED ORDER — WARFARIN SODIUM 10 MG PO TABS
10.0000 mg | ORAL_TABLET | Freq: Every day | ORAL | Status: DC
Start: 2015-10-17 — End: 2015-11-28

## 2015-10-17 MED ORDER — CARVEDILOL 3.125 MG PO TABS: 3.1250 mg | ORAL_TABLET | Freq: Two times a day (BID) | ORAL | Status: DC

## 2015-10-17 MED ORDER — WARFARIN SODIUM 2.5 MG PO TABS
12.5000 mg | ORAL_TABLET | Freq: Once | ORAL | Status: AC
  Administered 2015-10-17: 12.5 mg via ORAL
  Filled 2015-10-17: qty 1

## 2015-10-17 NOTE — Progress Notes (Signed)
Utilization review completed.  

## 2015-10-17 NOTE — Progress Notes (Addendum)
      [address]       [place] [address]             [phone]        8 Days Post-Op Procedure(s) (LRB): BENTALL PROCEDURE (using a St Jude mechanical valve, size 23) (N/A) TRANSESOPHAGEAL ECHOCARDIOGRAM (TEE) (N/A)  Subjective: Patient slept better with Ambien and says he still "feels it this am"  Objective: Vital signs in last 24 hours: Temp:  [97.7 F (36.5 C)-98.1 F (36.7 C)] 98.1 F (36.7 C) (02/03 0454) Pulse Rate:  [92-101] 95 (02/03 0454) Cardiac Rhythm:  [-] Normal sinus rhythm (02/02 1900) Resp:  [16-18] 18 (02/03 0454) BP: (113-125)/(65-73) 113/70 mmHg (02/03 0454) SpO2:  [97 %-100 %] 97 % (02/03 0454) Weight:  [180 lb 11.2 oz (81.965 kg)] 180 lb 11.2 oz (81.965 kg) (02/03 0454)  Pre op weight 86 kg Current Weight  10/17/15 180 lb 11.2 oz (81.965 kg)      Intake/Output from previous day: 02/02 0701 - 02/03 0700 In: 600 [P.O.:600] Out: -    Physical Exam:  Cardiovascular: RRR, no murmur, sharp valve click Pulmonary: Mostly clear; no rales, wheezes, or rhonchi. Abdomen: Soft, non tender, bowel sounds present. Extremities: No lower extremity edema. Wounds: Clean and dry.  No erythema or signs of infection.  Lab Results: CBC: No results for input(s): WBC, HGB, HCT, PLT in the last 72 hours. BMET:  No results for input(s): NA, K, CL, CO2, GLUCOSE, BUN, CREATININE, CALCIUM in the last 72 hours.  PT/INR:  Lab Results  Component Value Date   INR 1.63* 10/17/2015   INR 1.59* 10/16/2015   INR 1.40 10/15/2015   ABG:  INR: Will add last result for INR, ABG once components are confirmed Will add last 4 CBG results once components are confirmed  Assessment/Plan:  1. CV - Slightly tachy in the low 100's. Coreg stopped because of labile BP, but now improved. Will restart low dose Coreg. On Coumadin for mechanical valve. INR slightly increased from 1.59 to 1.63. He has been given 10 mg last 3 evenings with some increase. Per Dr.  Laneta Simmers, continue Coumadin 10 mg daily for now, but will give 12.5 mg prior to discharge. 2.  Pulmonary - On room air. Encourage incentive spirometer. 3.  Acute blood loss anemia - H and H stable at 12 and 34.4 4. DM-CBGs 141/167/148. Will restart Glipizide XL 5 mg and continue Metformin as taken pre op.  Pre op HGA1C 8.9. He will need to follow up with his medical doctor after discharge. 5. Per Dr. Laneta Simmers, discharge home  ZIMMERMAN,DONIELLE MPA-C 10/17/2015,7:22 AM    Chart reviewed, patient examined, agree with above. His INR is 1.63, slowly rising on 10 mg coumadin for the past three days. We will give him 12.5 mg today and continue 10 mg daily starting tomorrow. He will need INR follow up Monday. He is doing well otherwise.

## 2015-10-17 NOTE — Care Management Note (Signed)
Case Management Note Previous CM note initiated by Raynald Blend RN, CM  Patient Details  Name: Dennis Bates MRN: [id number] Date of Birth: 1961/02/12  Subjective/Objective:     Pt is 4 days status post Bentall procedure               Action/Plan:  Pt is independent from home with wife.  Wife will take time off of work at discharge to be with pt.  CM will continue to monitor for disposition needs   Expected Discharge Date:    10/17/15              Expected Discharge Plan:  Home/Self Care  In-House Referral:     Discharge planning Services  CM Consult  Post Acute Care Choice:  NA Choice offered to:  NA  DME Arranged:    DME Agency:     HH Arranged:    HH Agency:     Status of Service:  Completed, signed off  Medicare Important Message Given:    Date Medicare IM Given:    Medicare IM give by:    Date Additional Medicare IM Given:    Additional Medicare Important Message give by:     If discussed at Long Length of Stay Meetings, dates discussed:    Discharge Disposition: Home/Self care   Additional Comments:  10/17/15- 1000- [name] RN, BSN - pt for d/c home today with wife- no CM needs noted  Zenda Alpers Lenn Sink, RN 10/17/2015, 10:04 AM

## 2015-10-17 NOTE — Progress Notes (Signed)
Nursing note Patient and family given discharge instructions medication list and paper prescriptions given to patient. All questions answered. CT sutures removed as ordered steri strips applied.

## 2015-10-20 ENCOUNTER — Encounter: Payer: BC Managed Care – PPO | Admitting: Pharmacist Clinician (PhC)/ Clinical Pharmacy Specialist

## 2015-10-20 ENCOUNTER — Encounter (HOSPITAL_COMMUNITY): Payer: Self-pay | Admitting: Cardiology

## 2015-10-20 ENCOUNTER — Emergency Department (HOSPITAL_COMMUNITY): Payer: BC Managed Care – PPO

## 2015-10-20 ENCOUNTER — Observation Stay (HOSPITAL_COMMUNITY)
Admission: EM | Admit: 2015-10-20 | Discharge: 2015-10-23 | DRG: 872 | Disposition: A | Discharge status: Home / Self Care | Payer: BC Managed Care – PPO | Attending: Internal Medicine | Admitting: Internal Medicine

## 2015-10-20 ENCOUNTER — Telehealth: Payer: Self-pay | Admitting: *Deleted

## 2015-10-20 DIAGNOSIS — R509 Fever, unspecified: Secondary | ICD-10-CM | POA: Diagnosis present

## 2015-10-20 DIAGNOSIS — Z79899 Other long term (current) drug therapy: Secondary | ICD-10-CM | POA: Diagnosis not present

## 2015-10-20 DIAGNOSIS — E1165 Type 2 diabetes mellitus with hyperglycemia: Secondary | ICD-10-CM

## 2015-10-20 DIAGNOSIS — I959 Hypotension, unspecified: Secondary | ICD-10-CM | POA: Diagnosis present

## 2015-10-20 DIAGNOSIS — E119 Type 2 diabetes mellitus without complications: Secondary | ICD-10-CM | POA: Diagnosis present

## 2015-10-20 DIAGNOSIS — Z955 Presence of coronary angioplasty implant and graft: Secondary | ICD-10-CM

## 2015-10-20 DIAGNOSIS — I252 Old myocardial infarction: Secondary | ICD-10-CM | POA: Diagnosis not present

## 2015-10-20 DIAGNOSIS — Z7982 Long term (current) use of aspirin: Secondary | ICD-10-CM

## 2015-10-20 DIAGNOSIS — I2510 Atherosclerotic heart disease of native coronary artery without angina pectoris: Secondary | ICD-10-CM | POA: Diagnosis present

## 2015-10-20 DIAGNOSIS — E118 Type 2 diabetes mellitus with unspecified complications: Secondary | ICD-10-CM | POA: Diagnosis not present

## 2015-10-20 DIAGNOSIS — Z952 Presence of prosthetic heart valve: Secondary | ICD-10-CM | POA: Diagnosis not present

## 2015-10-20 DIAGNOSIS — Z8249 Family history of ischemic heart disease and other diseases of the circulatory system: Secondary | ICD-10-CM | POA: Diagnosis not present

## 2015-10-20 DIAGNOSIS — J069 Acute upper respiratory infection, unspecified: Secondary | ICD-10-CM | POA: Diagnosis not present

## 2015-10-20 DIAGNOSIS — I10 Essential (primary) hypertension: Secondary | ICD-10-CM | POA: Diagnosis not present

## 2015-10-20 DIAGNOSIS — A419 Sepsis, unspecified organism: Secondary | ICD-10-CM | POA: Diagnosis not present

## 2015-10-20 DIAGNOSIS — Z7901 Long term (current) use of anticoagulants: Secondary | ICD-10-CM

## 2015-10-20 DIAGNOSIS — E876 Hypokalemia: Secondary | ICD-10-CM | POA: Diagnosis present

## 2015-10-20 DIAGNOSIS — Q230 Congenital stenosis of aortic valve: Secondary | ICD-10-CM

## 2015-10-20 DIAGNOSIS — D72829 Elevated white blood cell count, unspecified: Secondary | ICD-10-CM | POA: Diagnosis not present

## 2015-10-20 DIAGNOSIS — D62 Acute posthemorrhagic anemia: Secondary | ICD-10-CM | POA: Diagnosis present

## 2015-10-20 DIAGNOSIS — Z7984 Long term (current) use of oral hypoglycemic drugs: Secondary | ICD-10-CM | POA: Diagnosis not present

## 2015-10-20 DIAGNOSIS — D649 Anemia, unspecified: Secondary | ICD-10-CM | POA: Diagnosis present

## 2015-10-20 DIAGNOSIS — Q231 Congenital insufficiency of aortic valve: Secondary | ICD-10-CM | POA: Diagnosis not present

## 2015-10-20 DIAGNOSIS — I350 Nonrheumatic aortic (valve) stenosis: Secondary | ICD-10-CM

## 2015-10-20 LAB — CBC WITH DIFFERENTIAL/PLATELET
Basophils Absolute: 0 10*3/uL (ref 0.0–0.1)
Basophils Relative: 0 %
Eosinophils Absolute: 0 10*3/uL (ref 0.0–0.7)
Eosinophils Relative: 0 %
HCT: 35.3 % — ABNORMAL LOW (ref 39.0–52.0)
Hemoglobin: 12.2 g/dL — ABNORMAL LOW (ref 13.0–17.0)
Lymphocytes Relative: 2 %
Lymphs Abs: 0.3 10*3/uL — ABNORMAL LOW (ref 0.7–4.0)
MCH: 29.3 pg (ref 26.0–34.0)
MCHC: 34.6 g/dL (ref 30.0–36.0)
MCV: 84.7 fL (ref 78.0–100.0)
Monocytes Absolute: 0.7 10*3/uL (ref 0.1–1.0)
Monocytes Relative: 4 %
Neutro Abs: 16.2 10*3/uL — ABNORMAL HIGH (ref 1.7–7.7)
Neutrophils Relative %: 94 %
Platelets: 533 10*3/uL — ABNORMAL HIGH (ref 150–400)
RBC: 4.17 MIL/uL — ABNORMAL LOW (ref 4.22–5.81)
RDW: 12.7 % (ref 11.5–15.5)
WBC: 17.3 10*3/uL — ABNORMAL HIGH (ref 4.0–10.5)

## 2015-10-20 LAB — COMPREHENSIVE METABOLIC PANEL
ALT: 37 U/L (ref 17–63)
AST: 54 U/L — ABNORMAL HIGH (ref 15–41)
Albumin: 3.8 g/dL (ref 3.5–5.0)
Alkaline Phosphatase: 109 U/L (ref 38–126)
Anion gap: 15 (ref 5–15)
BUN: 10 mg/dL (ref 6–20)
CO2: 19 mmol/L — ABNORMAL LOW (ref 22–32)
Calcium: 9.8 mg/dL (ref 8.9–10.3)
Chloride: 100 mmol/L — ABNORMAL LOW (ref 101–111)
Creatinine, Ser: 0.93 mg/dL (ref 0.61–1.24)
GFR calc Af Amer: >60 mL/min (ref >60)
GFR calc non Af Amer: >60 mL/min (ref >60)
Glucose, Bld: 172 mg/dL — ABNORMAL HIGH (ref 65–99)
Potassium: 4.6 mmol/L (ref 3.5–5.1)
Sodium: 134 mmol/L — ABNORMAL LOW (ref 135–145)
Total Bilirubin: 1.2 mg/dL (ref 0.3–1.2)
Total Protein: 7.1 g/dL (ref 6.5–8.1)

## 2015-10-20 LAB — URINALYSIS, ROUTINE W REFLEX MICROSCOPIC
Glucose, UA: 250 mg/dL — AB
Hgb urine dipstick: NEGATIVE
Ketones, ur: 15 mg/dL — AB
Leukocytes, UA: NEGATIVE
Nitrite: NEGATIVE
Protein, ur: NEGATIVE mg/dL
Specific Gravity, Urine: 1.024 (ref 1.005–1.030)
pH: 5 (ref 5.0–8.0)

## 2015-10-20 LAB — PROTIME-INR
INR: 2.4 — ABNORMAL HIGH (ref 0.00–1.49)
Prothrombin Time: 25.9 seconds — ABNORMAL HIGH (ref 11.6–15.2)

## 2015-10-20 LAB — INFLUENZA PANEL BY PCR (TYPE A & B)
H1N1 flu by pcr: NOT DETECTED
Influenza A By PCR: NEGATIVE
Influenza B By PCR: NEGATIVE

## 2015-10-20 LAB — I-STAT CG4 LACTIC ACID, ED
Lactic Acid, Venous: 2.03 mmol/L (ref 0.5–2.0)
Lactic Acid, Venous: 1.57 mmol/L (ref 0.5–2.0)
Lactic Acid, Venous: 2.41 mmol/L (ref 0.5–2.0)

## 2015-10-20 LAB — LACTIC ACID, PLASMA: Lactic Acid, Venous: 1.5 mmol/L (ref 0.5–2.0)

## 2015-10-20 MED ORDER — OXYCODONE HCL 5 MG PO TABS: 5.0000 mg | ORAL_TABLET | ORAL | Status: DC | PRN

## 2015-10-20 MED ORDER — VANCOMYCIN HCL 10 G IV SOLR
1500.0000 mg | Freq: Once | INTRAVENOUS | Status: AC
  Administered 2015-10-20: 1500 mg via INTRAVENOUS
  Filled 2015-10-20: qty 1500

## 2015-10-20 MED ORDER — ACETAMINOPHEN 500 MG PO TABS
1000.0000 mg | ORAL_TABLET | Freq: Once | ORAL | Status: AC
  Administered 2015-10-20: 1000 mg via ORAL
  Filled 2015-10-20: qty 2

## 2015-10-20 MED ORDER — ASPIRIN EC 81 MG PO TBEC
81.0000 mg | DELAYED_RELEASE_TABLET | Freq: Every morning | ORAL | Status: DC
  Administered 2015-10-21 – 2015-10-23 (×3): 81 mg via ORAL
  Filled 2015-10-20 (×3): qty 1

## 2015-10-20 MED ORDER — INSULIN ASPART 100 UNIT/ML ~~LOC~~ SOLN
0.0000 [IU] | Freq: Three times a day (TID) | SUBCUTANEOUS | Status: DC
  Administered 2015-10-21: 3 [IU] via SUBCUTANEOUS
  Administered 2015-10-21: 5 [IU] via SUBCUTANEOUS
  Administered 2015-10-21 – 2015-10-22 (×3): 3 [IU] via SUBCUTANEOUS
  Administered 2015-10-22: 2 [IU] via SUBCUTANEOUS
  Administered 2015-10-23: 3 [IU] via SUBCUTANEOUS

## 2015-10-20 MED ORDER — PIPERACILLIN-TAZOBACTAM 3.375 G IVPB
3.3750 g | Freq: Three times a day (TID) | INTRAVENOUS | Status: DC
  Filled 2015-10-20 (×2): qty 50

## 2015-10-20 MED ORDER — CARVEDILOL 3.125 MG PO TABS: 3.1250 mg | ORAL_TABLET | Freq: Two times a day (BID) | ORAL | Status: DC

## 2015-10-20 MED ORDER — VANCOMYCIN HCL IN DEXTROSE 1-5 GM/200ML-% IV SOLN
1000.0000 mg | Freq: Three times a day (TID) | INTRAVENOUS | Status: DC
  Administered 2015-10-20 – 2015-10-23 (×8): 1000 mg via INTRAVENOUS
  Filled 2015-10-20 (×10): qty 200

## 2015-10-20 MED ORDER — ATORVASTATIN CALCIUM 40 MG PO TABS
40.0000 mg | ORAL_TABLET | Freq: Every day | ORAL | Status: DC
  Administered 2015-10-20 – 2015-10-22 (×3): 40 mg via ORAL
  Filled 2015-10-20 (×3): qty 1

## 2015-10-20 MED ORDER — SODIUM CHLORIDE 0.9 % IV BOLUS (SEPSIS)
500.0000 mL | Freq: Once | INTRAVENOUS | Status: AC
  Administered 2015-10-20: 500 mL via INTRAVENOUS

## 2015-10-20 MED ORDER — WARFARIN SODIUM 5 MG PO TABS
10.0000 mg | ORAL_TABLET | Freq: Once | ORAL | Status: AC
  Administered 2015-10-20: 10 mg via ORAL
  Filled 2015-10-20: qty 2

## 2015-10-20 MED ORDER — SODIUM CHLORIDE 0.9 % IV BOLUS (SEPSIS)
250.0000 mL | Freq: Once | INTRAVENOUS | Status: AC
  Administered 2015-10-20: 250 mL via INTRAVENOUS

## 2015-10-20 MED ORDER — ENOXAPARIN SODIUM 40 MG/0.4ML ~~LOC~~ SOLN: 40.0000 mg | SUBCUTANEOUS | Status: DC

## 2015-10-20 MED ORDER — PIPERACILLIN-TAZOBACTAM 3.375 G IVPB 30 MIN
3.3750 g | Freq: Once | INTRAVENOUS | Status: AC
  Administered 2015-10-20: 3.375 g via INTRAVENOUS
  Filled 2015-10-20: qty 50

## 2015-10-20 MED ORDER — CARVEDILOL 3.125 MG PO TABS
3.1250 mg | ORAL_TABLET | Freq: Two times a day (BID) | ORAL | Status: DC
  Administered 2015-10-21: 3.125 mg via ORAL
  Filled 2015-10-20 (×2): qty 1

## 2015-10-20 MED ORDER — GLIPIZIDE ER 5 MG PO TB24
5.0000 mg | ORAL_TABLET | Freq: Every day | ORAL | Status: DC
  Filled 2015-10-20 (×2): qty 1

## 2015-10-20 MED ORDER — WARFARIN - PHARMACIST DOSING INPATIENT: Freq: Every day | Status: DC

## 2015-10-20 MED ORDER — SODIUM CHLORIDE 0.9 % IV BOLUS (SEPSIS)
1000.0000 mL | Freq: Once | INTRAVENOUS | Status: AC
  Administered 2015-10-20: 1000 mL via INTRAVENOUS

## 2015-10-20 MED ORDER — ACETAMINOPHEN 325 MG PO TABS
650.0000 mg | ORAL_TABLET | Freq: Four times a day (QID) | ORAL | Status: DC | PRN
Start: 2015-10-20 — End: 2015-10-23
  Filled 2015-10-20: qty 2

## 2015-10-20 MED ORDER — FENOFIBRATE 160 MG PO TABS
160.0000 mg | ORAL_TABLET | Freq: Every morning | ORAL | Status: DC
  Administered 2015-10-21 – 2015-10-23 (×3): 160 mg via ORAL
  Filled 2015-10-20 (×3): qty 1

## 2015-10-20 MED ORDER — INSULIN ASPART 100 UNIT/ML ~~LOC~~ SOLN: 0.0000 [IU] | Freq: Every day | SUBCUTANEOUS | Status: DC

## 2015-10-20 MED ORDER — ACETAMINOPHEN 650 MG RE SUPP: 650.0000 mg | Freq: Four times a day (QID) | RECTAL | Status: DC | PRN

## 2015-10-20 MED ORDER — SODIUM CHLORIDE 0.9% FLUSH
3.0000 mL | Freq: Two times a day (BID) | INTRAVENOUS | Status: DC
  Administered 2015-10-20 – 2015-10-23 (×6): 3 mL via INTRAVENOUS

## 2015-10-20 MED ORDER — VANCOMYCIN HCL IN DEXTROSE 1-5 GM/200ML-% IV SOLN
1000.0000 mg | Freq: Once | INTRAVENOUS | Status: DC
  Filled 2015-10-20: qty 200

## 2015-10-20 NOTE — Progress Notes (Addendum)
Pharmacy Antibiotic Note  Dennis Bates is a 55 y.o. male admitted on 10/20/2015 with sepsis.  Pharmacy has been consulted for vanc/zosyn dosing.   Pt presented with fever up to 103. Tmax 103.2 in ED. Pt is s/p aortic valve replacement on 10/09/15.  Plan: Vanc 1500 mg IV x1 in ED, followed by 1000 mg IV q8h Zosyn 3.375 g IV q8h Monitor renal function, VT prn, LOT, cultures, clinical progression  Weight: 180 lb (81.647 kg)  Temp (24hrs), Avg:101.6 F (38.7 C), Min:99.9 F (37.7 C), Max:103.2 F (39.6 C)   Recent Labs Lab 10/20/15 1242 10/20/15 1243 10/20/15 1357  WBC 17.3*  --   --   CREATININE 0.93  --   --   LATICACIDVEN  --  2.03* 1.57    Estimated Creatinine Clearance: 92.9 mL/min (by C-G formula based on Cr of 0.93).    No Known Allergies  Antimicrobials this admission: Vanc 2/6 >>  Zosyn 2/6 >>   Dose adjustments this admission: N/a  Microbiology results: 2/6 BCx: sent 2/6 UCx: sent   Thank you for allowing pharmacy to be a part of this patient's care.  Greggory Stallion, PharmD Clinical Pharmacy Resident Pager # [phone] 10/20/2015 2:28 PM    Pharmacy Code Sepsis Protocol  Time of code sepsis page: 1330  Antibiotics delivered at 1400  Zosyn pulled and administered prior to vanc delivery.  Were antibiotics ordered at the time of the code sepsis page? Yes Was it required to contact the physician?  Physician not contacted  Physician contacted to order antibiotics for code sepsis  Physician contacted to recommend changing antibiotics  Pharmacy consulted for: vanc/zosyn  Anti-infectives    Start     Dose/Rate Route Frequency Ordered Stop   10/20/15 1345  vancomycin (VANCOCIN) 1,500 mg in sodium chloride 0.9 % 500 mL IVPB     1,500 mg 250 mL/hr over 120 Minutes Intravenous  Once 10/20/15 1332     10/20/15 1330  piperacillin-tazobactam (ZOSYN) IVPB 3.375 g     3.375 g 100 mL/hr over 30 Minutes Intravenous  Once 10/20/15 1321 10/20/15 1408   10/20/15 1330  vancomycin (VANCOCIN) IVPB 1000 mg/200 mL premix  Status:  Discontinued     1,000 mg 200 mL/hr over 60 Minutes Intravenous  Once 10/20/15 1321 10/20/15 1332        Nurse education provided:  Minutes left to administer antibiotics to achieve 1 hour goal  Correct order of antibiotic administration  Antibiotic Y-site compatibilities     Greggory Stallion, PharmD Clinical Pharmacy Resident Pager # [phone] 10/20/2015 2:19 PM

## 2015-10-20 NOTE — ED Notes (Signed)
Pt reports he had heart valve and aorta surgery back on the 26th. Released on Friday and developed a fever of 103 this morning. Pt also reports no appetite. Called the cardiology office and was told to come here.

## 2015-10-20 NOTE — Progress Notes (Signed)
ANTICOAGULATION CONSULT NOTE - Initial Consult  Pharmacy Consult for warfarin Indication: mechanical valve  No Known Allergies  Patient Measurements: Weight: 180 lb (81.647 kg) Heparin Dosing Weight: 81.6 kg  Vital Signs: Temp: 100.5 F (38.1 C) (02/06 1806) Temp Source: Oral (02/06 1520) BP: 106/79 mmHg (02/06 1830) Pulse Rate: 103 (02/06 1830)  Labs:  Recent Labs  10/20/15 1242 10/20/15 1340  HGB 12.2*  --   HCT 35.3*  --   PLT 533*  --   LABPROT  --  25.9*  INR  --  2.40*  CREATININE 0.93  --     Estimated Creatinine Clearance: 92.9 mL/min (by C-G formula based on Cr of 0.93).  Assessment: Dennis Bates is a 55 y.o. male admitted on 10/20/2015 with sepsis.  Pharmacy has been consulted for vanc/zosyn dosing.  Recent valve replacement 1/26  AC: warfarin for mechanical valve. INR 2.4  Home warfarin 10 mg daily  Heme: H&H 12.2/35.3, Plt 533  Goal of Therapy:  INR 2-3 Monitor platelets by anticoagulation protocol: Yes   Plan:  Warfarin 10 mg x 1  Daily INR, CBC Monitor for s/sx of bleeding  Isaac Bliss, PharmD, BCPS, [facility] Clinical Pharmacist Pager [phone] 10/20/2015 7:57 PM

## 2015-10-20 NOTE — H&P (Signed)
[facility] History and Physical  Dennis Bates ZOX:[id number] DOB: 1961-06-01 DOA: 10/20/2015  Referring physician: er PCP: Leanor Rubenstein, MD   Chief Complaint: fever/fatigue  HPI: Dennis Bates is a 55 y.o. male  with a hx of aortic valve replacement, presents to the Emergency Department today due to a fever. Recent Bentall Procedure on 10-09-15 and had a mechanical valve placed due to severe aortic stenosis. He was discharged 2/3 from the Hospital without any complications. Last PM patient developed nausea. This AM he had severe chills.  His wife took his temperature and it was found to be 103. They spoke with the CT surgeon who recommended they go to the hospital for further evaluation.  No CP/ABD pain. No dysuria. No headache/vision chagnes. No diarrhea. Nausea resolved.  Incidentally, patient was around in-laws recently who both had the flu before visiting patient. No calf tenderness.  In the ER, urine did not show signs of infection, x ray showed some atelectasis and pleural effusion.  Wound is healing nicely on chest.  No drainage.  WBC count elevated and BP persistanly low despite fluid boluses in the ER.  Lactic acid elevated but decreased with IVF.      Review of Systems:  All systems reviewed, negative unless stated above    Past Medical History  Diagnosis Date  . Hyperlipidemia   . Bell's palsy   . Aortic stenosis due to bicuspid aortic valve     Probable bicuspid aortic valve: a. mod-sev by 2D ECHO 09/2014. AVA 0.9cm^2; b) 10/2014 Echo:. Severe AS Mn-Pk Gradient 41-71 mmHg, AVA ~0.79 cm2, EF 60-65%  . Hypertriglyceridemia   . CAD S/P percutaneous coronary angioplasty 09/20/2014    a. inferolat STEMI ->> LHC-Angio: Proximal/ostial LAD 20%, OM2 99% -->> PCI: 3mm x 16 mm Promus Premier DES to the OM2.(~3.5 mm)  . ST elevation myocardial infarction (STEMI) of inferior wall (HCC) 09/20/2014  . Hypertension   . Heart murmur   . Shortness of breath dyspnea   . Diabetes  mellitus type 2, controlled, with complications [facility])    Past Surgical History  Procedure Laterality Date  . Cardiac catheterization      remote h/o cath >15 yrs ago, unkown  . Doppler echocardiography  January 2016, February2016    Probable bicuspid aortic valve: a. mod-sev by 2D ECHO 09/2014. AVA 0.9cm^2; b) 10/2014 Echo:. Severe AS Mn-Pk Gradient 41-71 mmHg, AVA ~0.79 cm2, EF 60-65%  . Left heart catheterization with coronary angiogram N/A 09/20/2014    Procedure: LEFT HEART CATHETERIZATION WITH CORONARY ANGIOGRAM;  Surgeon: Marykay Lex, MD;  Location: [facility] CATH LAB;  Service: Cardiovascular;  Laterality: N/A;  . Percutaneous coronary stent intervention (pci-s)  09/20/2014    PTCI to OM 2 with Promus Premier DES 3.0 mm x 16 mm (3.5 mm)  . Knee arthroscopy w/ acl reconstruction Right     90's  . Bentall procedure N/A 10/09/2015    Procedure: BENTALL PROCEDURE (using a St Jude mechanical valve, size 23);  Surgeon: Alleen Borne, MD;  Location: [facility] OR;  Service: Open Heart Surgery;  Laterality: N/A;  CIRC ARREST  NEEDS RIGHT RADIAL A-LINE  . Tee without cardioversion N/A 10/09/2015    Procedure: TRANSESOPHAGEAL ECHOCARDIOGRAM (TEE);  Surgeon: Alleen Borne, MD;  Location: [facility] OR;  Service: Open Heart Surgery;  Laterality: N/A;   Social History:  reports that he has never smoked. He does not have any smokeless tobacco history on file. He reports that he does not drink alcohol or use  illicit drugs.  No Known Allergies  Family History  Problem Relation Age of Onset  . CAD Neg Hx     per wife    Prior to Admission medications   Medication Sig Start Date End Date Taking? Authorizing Provider  acetaminophen (TYLENOL) 325 MG tablet Take 2 tablets (650 mg total) by mouth every 6 (six) hours as needed for mild pain. 10/17/15  Yes Donielle Margaretann Loveless, PA-C  aspirin EC 81 MG EC tablet Take 1 tablet (81 mg total) by mouth daily. Patient taking differently: Take 81 mg by mouth every morning.  09/23/14   Yes Janetta Hora, PA-C  atorvastatin (LIPITOR) 80 MG tablet Take 1 tablet (80 mg total) by mouth daily. Patient taking differently: Take 40 mg by mouth at bedtime.  09/23/14  Yes Janetta Hora, PA-C  carvedilol (COREG) 3.125 MG tablet Take 1 tablet (3.125 mg total) by mouth 2 (two) times daily with a meal. 09/23/14  Yes Janetta Hora, PA-C  fenofibrate 160 MG tablet Take 1 tablet (160 mg total) by mouth daily. Patient taking differently: Take 160 mg by mouth every morning.  09/23/14  Yes Janetta Hora, PA-C  glipiZIDE (GLUCOTROL XL) 5 MG 24 hr tablet Take 1 tablet (5 mg total) by mouth daily with breakfast. 09/23/14  Yes Janetta Hora, PA-C  metFORMIN (GLUCOPHAGE) 1000 MG tablet Take 1,000 mg by mouth 2 (two) times daily with a meal.  03/27/14  Yes Historical Provider, MD  oxyCODONE (OXY IR/ROXICODONE) 5 MG immediate release tablet Take 1-2 tablets (5-10 mg total) by mouth every 3 (three) hours as needed for severe pain. 10/17/15  Yes Donielle Margaretann Loveless, PA-C  warfarin (COUMADIN) 10 MG tablet Take 1 tablet (10 mg total) by mouth daily at 6 PM. Or as directed 10/17/15  Yes Ardelle Balls, PA-C   Physical Exam: Filed Vitals:   10/20/15 1445 10/20/15 1515 10/20/15 1520 10/20/15 1545  BP: 108/69 83/50  97/59  Pulse: 110 104  100  Temp:   99 F (37.2 C)   TempSrc:   Oral   Resp: Weight:      SpO2: 95% 98%  98%    Wt Readings from Last 3 Encounters:  10/20/15 81.647 kg (180 lb)  10/17/15 81.965 kg (180 lb 11.2 oz)  10/07/15 86.365 kg (190 lb 6.4 oz)    General:  Appears calm and comfortable Eyes: PERRL, normal lids, irises & conjunctiva ENT: grossly normal hearing, lips & tongue Neck: no LAD, masses or thyromegaly Cardiovascular: RRR, no m/r/g. No LE edema. Telemetry: SR, no arrhythmias  Respiratory: CTA bilaterally, no w/r/r. Normal respiratory effort. Abdomen: soft, ntnd Skin: would on chest healing nicely, no drainage Musculoskeletal: grossly  normal tone BUE/BLE Psychiatric: grossly normal mood and affect, speech fluent and appropriate Neurologic: grossly non-focal.          Labs on Admission:  Basic Metabolic Panel:  Recent Labs Lab 10/20/15 1242  NA 134*  K 4.6  CL 100*  CO2 19*  GLUCOSE 172*  BUN 10  CREATININE 0.93  CALCIUM 9.8   Liver Function Tests:  Recent Labs Lab 10/20/15 1242  AST 54*  ALT 37  ALKPHOS 109  BILITOT 1.2  PROT 7.1  ALBUMIN 3.8   No results for input(s): LIPASE, AMYLASE in the last 168 hours. No results for input(s): AMMONIA in the last 168 hours. CBC:  Recent Labs Lab 10/20/15 1242  WBC 17.3*  NEUTROABS 16.2*  HGB 12.2*  HCT 35.3*  MCV 84.7  PLT 533*   Cardiac Enzymes: No results for input(s): CKTOTAL, CKMB, CKMBINDEX, TROPONINI in the last 168 hours.  BNP (last 3 results) No results for input(s): BNP in the last 8760 hours.  ProBNP (last 3 results) No results for input(s): PROBNP in the last 8760 hours.  CBG:  Recent Labs Lab 10/14/15 1630 10/14/15 2109 10/15/15 0615 10/15/15 1126 10/15/15 1631  GLUCAP 144* 131* 141* 167* 148*    Radiological Exams on Admission: Dg Chest 2 View  10/20/2015  CLINICAL DATA:  Status post recent heart valve and aorta surgery. Newly developed fever. EXAM: CHEST  2 VIEW COMPARISON:  Chest x-rays dated 10/11/2015 and 10/07/2015. FINDINGS: Persistent streaky opacities are seen within the lower lobes bilaterally, not significantly changed, most suggestive of atelectasis. Small left pleural effusion again noted. No new lung findings. No pneumothorax. Cardiomediastinal silhouette is stable in size and configuration. Median sternotomy wires appear intact and stable alignment. Valvular hardware is stable in position. No acute osseous abnormality. Soft tissue structures about the chest are unremarkable. IMPRESSION: Streaky/linear opacities at each lung base, most likely atelectasis. Also persistent small left pleural effusion. Postsurgical  changes as above. No convincing evidence of a developing pneumonia. Electronically Signed   By: Bary Richard M.D.   On: 10/20/2015 13:00    EKG: Independently reviewed.sinus tachy  Assessment/Plan Active Problems:   Diabetes type 2, uncontrolled (HCC)   Aortic stenosis due to bicuspid aortic valve   Fever   Leukocytosis   Fever/leukcytosis -blood cultures pending -flu pending -U/A and x ray ok -?etiology Encourage incentive spirometry  Hypotension -IVF Admit to SDU -BP at home 120s per wife  DM -SSI -hold metformin  Recent bentall procedure -CV Surgary consult    Code Status: DVT Prophylaxis: Family Communication:  Disposition Plan:   Time spent: 75 min  Ollis Daudelin U [facility] Pager [phone]

## 2015-10-20 NOTE — ED Notes (Signed)
Attempted report to 3S. 

## 2015-10-20 NOTE — ED Provider Notes (Signed)
CSN: [id number]     Arrival date & time 10/20/15  1205 History   First MD Initiated Contact with Patient 10/20/15 1314     Chief Complaint  Patient presents with  . Fever   (Consider location/radiation/quality/duration/timing/severity/associated sxs/prior Treatment) HPI 55 y.o. male with a hx of aortic valve replacement, presents to the Emergency Department today due to a fever. The patient had a recent Bentall Procedure on 10-09-15 and had a mechanical valve placed due to severe aortic stenosis. He was discharged this past Friday from the Hospital without any complications. This morning, the patient felt weak and was diaphoretic around 11am. His wife took his temperature and it was found to be 103F by mouth. They called they CT surgeon who recommended they go to the hospital for further evaluation. Currently the patient reports dyspnea and nausea. No CP/ABD pain. No dysuria. No headache/vision chagnes. No diarrhea. No other symptoms noted.   CT Surgeon- Dr. Laneta Simmers   Past Medical History  Diagnosis Date  . Hyperlipidemia   . Bell's palsy   . Aortic stenosis due to bicuspid aortic valve     Probable bicuspid aortic valve: a. mod-sev by 2D ECHO 09/2014. AVA 0.9cm^2; b) 10/2014 Echo:. Severe AS Mn-Pk Gradient 41-71 mmHg, AVA ~0.79 cm2, EF 60-65%  . Hypertriglyceridemia   . CAD S/P percutaneous coronary angioplasty 09/20/2014    a. inferolat STEMI ->> LHC-Angio: Proximal/ostial LAD 20%, OM2 99% -->> PCI: 3mm x 16 mm Promus Premier DES to the OM2.(~3.5 mm)  . ST elevation myocardial infarction (STEMI) of inferior wall (HCC) 09/20/2014  . Hypertension   . Heart murmur   . Shortness of breath dyspnea   . Diabetes mellitus type 2, controlled, with complications [facility])    Past Surgical History  Procedure Laterality Date  . Cardiac catheterization      remote h/o cath >15 yrs ago, unkown  . Doppler echocardiography  January 2016, February2016    Probable bicuspid aortic valve: a. mod-sev by 2D ECHO  09/2014. AVA 0.9cm^2; b) 10/2014 Echo:. Severe AS Mn-Pk Gradient 41-71 mmHg, AVA ~0.79 cm2, EF 60-65%  . Left heart catheterization with coronary angiogram N/A 09/20/2014    Procedure: LEFT HEART CATHETERIZATION WITH CORONARY ANGIOGRAM;  Surgeon: Marykay Lex, MD;  Location: [facility] CATH LAB;  Service: Cardiovascular;  Laterality: N/A;  . Percutaneous coronary stent intervention (pci-s)  09/20/2014    PTCI to OM 2 with Promus Premier DES 3.0 mm x 16 mm (3.5 mm)  . Knee arthroscopy w/ acl reconstruction Right     90's  . Bentall procedure N/A 10/09/2015    Procedure: BENTALL PROCEDURE (using a St Jude mechanical valve, size 23);  Surgeon: Alleen Borne, MD;  Location: [facility] OR;  Service: Open Heart Surgery;  Laterality: N/A;  CIRC ARREST  NEEDS RIGHT RADIAL A-LINE  . Tee without cardioversion N/A 10/09/2015    Procedure: TRANSESOPHAGEAL ECHOCARDIOGRAM (TEE);  Surgeon: Alleen Borne, MD;  Location: [facility] OR;  Service: Open Heart Surgery;  Laterality: N/A;   Family History  Problem Relation Age of Onset  . CAD Neg Hx     per wife   Social History  Substance Use Topics  . Smoking status: Never Smoker   . Smokeless tobacco: None  . Alcohol Use: No    Review of Systems ROS reviewed and all are negative for acute change except as noted in the HPI.  Allergies  Review of patient's allergies indicates no known allergies.  Home Medications   Prior to Admission  medications   Medication Sig Start Date End Date Taking? Authorizing Provider  acetaminophen (TYLENOL) 325 MG tablet Take 2 tablets (650 mg total) by mouth every 6 (six) hours as needed for mild pain. 10/17/15   Donielle Margaretann Loveless, PA-C  aspirin EC 81 MG EC tablet Take 1 tablet (81 mg total) by mouth daily. 09/23/14   Janetta Hora, PA-C  atorvastatin (LIPITOR) 80 MG tablet Take 1 tablet (80 mg total) by mouth daily. Patient taking differently: Take 40 mg by mouth daily.  09/23/14   Janetta Hora, PA-C  carvedilol (COREG) 3.125 MG tablet  Take 1 tablet (3.125 mg total) by mouth 2 (two) times daily with a meal. 09/23/14   Janetta Hora, PA-C  fenofibrate 160 MG tablet Take 1 tablet (160 mg total) by mouth daily. 09/23/14   Janetta Hora, PA-C  glipiZIDE (GLUCOTROL XL) 5 MG 24 hr tablet Take 1 tablet (5 mg total) by mouth daily with breakfast. 09/23/14   Janetta Hora, PA-C  metFORMIN (GLUCOPHAGE) 1000 MG tablet Take 1,000 mg by mouth 2 (two) times daily with a meal.  03/27/14   Historical Provider, MD  oxyCODONE (OXY IR/ROXICODONE) 5 MG immediate release tablet Take 1-2 tablets (5-10 mg total) by mouth every 3 (three) hours as needed for severe pain. 10/17/15   Donielle Margaretann Loveless, PA-C  warfarin (COUMADIN) 10 MG tablet Take 1 tablet (10 mg total) by mouth daily at 6 PM. Or as directed 10/17/15   Donielle M Joycelyn Man, PA-C   BP 115/72 mmHg  Pulse 119  Temp(Src) 103.2 F (39.6 C) (Oral)  Resp 18  Wt 81.647 kg  SpO2 95%   Physical Exam  Constitutional: He is oriented to person, place, and time. Vital signs are normal. He appears well-developed and well-nourished.  HENT:  Head: Normocephalic and atraumatic.  Right Ear: Tympanic membrane, external ear and ear canal normal.  Left Ear: Tympanic membrane, external ear and ear canal normal.  Nose: Nose normal.  Mouth/Throat: Uvula is midline, oropharynx is clear and moist and mucous membranes are normal.  Eyes: EOM are normal.  Neck: Trachea normal and normal range of motion. Neck supple.  Cardiovascular: Normal rate, regular rhythm, S1 normal, S2 normal, normal heart sounds, intact distal pulses and normal pulses.   Pulmonary/Chest: Effort normal and breath sounds normal. He exhibits no tenderness.  Surgical incision noted on sternum. No erythema or drainage noted. Surgical site is well healed.   Abdominal: Soft. Normal appearance and bowel sounds are normal. There is no tenderness.  Musculoskeletal: Normal range of motion.  Neurological: He is alert and oriented to  person, place, and time.  Skin: Skin is warm and dry.  Psychiatric: He has a normal mood and affect. His behavior is normal. Thought content normal.  Nursing note and vitals reviewed.   ED Course  Procedures (including critical care time) Labs Review Labs Reviewed  COMPREHENSIVE METABOLIC PANEL - Abnormal; Notable for the following:    Sodium 134 (*)    Chloride 100 (*)    CO2 19 (*)    Glucose, Bld 172 (*)    AST 54 (*)    All other components within normal limits  URINALYSIS, ROUTINE W REFLEX MICROSCOPIC (NOT AT [facility]) - Abnormal; Notable for the following:    Glucose, UA 250 (*)    Bilirubin Urine SMALL (*)    Ketones, ur 15 (*)    All other components within normal limits  CBC WITH DIFFERENTIAL/PLATELET - Abnormal; Notable for  the following:    WBC 17.3 (*)    RBC 4.17 (*)    Hemoglobin 12.2 (*)    HCT 35.3 (*)    Platelets 533 (*)    Neutro Abs 16.2 (*)    Lymphs Abs 0.3 (*)    All other components within normal limits  PROTIME-INR - Abnormal; Notable for the following:    Prothrombin Time 25.9 (*)    INR 2.40 (*)    All other components within normal limits  I-STAT CG4 LACTIC ACID, ED - Abnormal; Notable for the following:    Lactic Acid, Venous 2.03 (*)    All other components within normal limits  CULTURE, BLOOD (ROUTINE X 2)  CULTURE, BLOOD (ROUTINE X 2)  URINE CULTURE  I-STAT CG4 LACTIC ACID, ED  I-STAT CG4 LACTIC ACID, ED    Imaging Review Dg Chest 2 View  10/20/2015  CLINICAL DATA:  Status post recent heart valve and aorta surgery. Newly developed fever. EXAM: CHEST  2 VIEW COMPARISON:  Chest x-rays dated 10/11/2015 and 10/07/2015. FINDINGS: Persistent streaky opacities are seen within the lower lobes bilaterally, not significantly changed, most suggestive of atelectasis. Small left pleural effusion again noted. No new lung findings. No pneumothorax. Cardiomediastinal silhouette is stable in size and configuration. Median sternotomy wires appear intact and  stable alignment. Valvular hardware is stable in position. No acute osseous abnormality. Soft tissue structures about the chest are unremarkable. IMPRESSION: Streaky/linear opacities at each lung base, most likely atelectasis. Also persistent small left pleural effusion. Postsurgical changes as above. No convincing evidence of a developing pneumonia. Electronically Signed   By: Bary Richard M.D.   On: 10/20/2015 13:00   I have personally reviewed and evaluated these images and lab results as part of my medical decision-making.   EKG Interpretation   Date/Time:  Monday October 20 2015 12:17:03 EST Ventricular Rate:  118 PR Interval:  160 QRS Duration: 104 QT Interval:  322 QTC Calculation: 451 R Axis:   -146 Text Interpretation:  Sinus tachycardia Possible Lateral infarct , age  undetermined Inferior infarct , age undetermined Abnormal ECG No  significant change since last tracing Confirmed by [facility]  MD, MARTHA  [phone]) on 10/20/2015 3:12:32 PM      MDM  I have reviewed relevant laboratory values.I have reviewed relevant imaging studies.I personally interpreted the relevant EKG.I have reviewed the relevant previous healthcare records.I obtained HPI from historian. Patient discussed with supervising physician  ED Course:  Assessment: 6y M with recent aortic valve replacement on 10-09-15 presents with fever of unknown origin. Initial Lactate 2.03 and Oral Temp 103.61F with Leukocystosis (17.3). Given NS Bolus . On Vanc/Zosyn. Blood Cx drawn. Pt well appearing. In NAD. Tachycardic, but normotensive. Lactate dropped to 1.57 with fluids. INR stable at 2.4 on Coumadin. UA unremarkable for UTI. CXR showed no developing pneumonia. Will plan to admit to Medicine with Dr. Laneta Simmers to follow in AM due to fever of unknown origin.        Disposition/Plan:  Admit Pt acknowledges and agrees with plan  Supervising Physician Jerelyn Scott, MD   Final diagnoses:  Fever, unspecified fever cause      Audry Pili, PA-C 10/20/15 1600  Jerelyn Scott, MD 10/20/15 [phone]

## 2015-10-20 NOTE — Telephone Encounter (Signed)
Dennis Bates had a Bentall Procedure on 10/09/15 and was discharged on 10/17/15.  He is experiencing chills today and has a temperature of 103.  I advised them to go to the ED and she agreed.

## 2015-10-21 DIAGNOSIS — R509 Fever, unspecified: Secondary | ICD-10-CM | POA: Insufficient documentation

## 2015-10-21 DIAGNOSIS — E1165 Type 2 diabetes mellitus with hyperglycemia: Secondary | ICD-10-CM

## 2015-10-21 DIAGNOSIS — R5082 Postprocedural fever: Secondary | ICD-10-CM

## 2015-10-21 DIAGNOSIS — A419 Sepsis, unspecified organism: Secondary | ICD-10-CM

## 2015-10-21 DIAGNOSIS — Q231 Congenital insufficiency of aortic valve: Secondary | ICD-10-CM | POA: Diagnosis not present

## 2015-10-21 LAB — CBC
HCT: 24.7 % — ABNORMAL LOW (ref 39.0–52.0)
Hemoglobin: 8.7 g/dL — ABNORMAL LOW (ref 13.0–17.0)
MCH: 29.8 pg (ref 26.0–34.0)
MCHC: 35.2 g/dL (ref 30.0–36.0)
MCV: 84.6 fL (ref 78.0–100.0)
Platelets: 329 10*3/uL (ref 150–400)
RBC: 2.92 MIL/uL — ABNORMAL LOW (ref 4.22–5.81)
RDW: 12.8 % (ref 11.5–15.5)
WBC: 7.5 10*3/uL (ref 4.0–10.5)

## 2015-10-21 LAB — BASIC METABOLIC PANEL
Anion gap: 9 (ref 5–15)
BUN: 8 mg/dL (ref 6–20)
CO2: 21 mmol/L — ABNORMAL LOW (ref 22–32)
Calcium: 8.3 mg/dL — ABNORMAL LOW (ref 8.9–10.3)
Chloride: 104 mmol/L (ref 101–111)
Creatinine, Ser: 0.81 mg/dL (ref 0.61–1.24)
GFR calc Af Amer: >60 mL/min (ref >60)
GFR calc non Af Amer: >60 mL/min (ref >60)
Glucose, Bld: 149 mg/dL — ABNORMAL HIGH (ref 65–99)
Potassium: 3.4 mmol/L — ABNORMAL LOW (ref 3.5–5.1)
Sodium: 134 mmol/L — ABNORMAL LOW (ref 135–145)

## 2015-10-21 LAB — GLUCOSE, CAPILLARY
Glucose-Capillary: 163 mg/dL — ABNORMAL HIGH (ref 65–99)
Glucose-Capillary: 124 mg/dL — ABNORMAL HIGH (ref 65–99)
Glucose-Capillary: 153 mg/dL — ABNORMAL HIGH (ref 65–99)
Glucose-Capillary: 159 mg/dL — ABNORMAL HIGH (ref 65–99)
Glucose-Capillary: 206 mg/dL — ABNORMAL HIGH (ref 65–99)
Glucose-Capillary: 109 mg/dL — ABNORMAL HIGH (ref 65–99)

## 2015-10-21 LAB — URINE CULTURE: Culture: 9000

## 2015-10-21 LAB — PROTIME-INR
INR: 2.76 — ABNORMAL HIGH (ref 0.00–1.49)
Prothrombin Time: 28.8 seconds — ABNORMAL HIGH (ref 11.6–15.2)

## 2015-10-21 MED ORDER — PIPERACILLIN-TAZOBACTAM 3.375 G IVPB 30 MIN
3.3750 g | Freq: Three times a day (TID) | INTRAVENOUS | Status: DC
  Administered 2015-10-21 – 2015-10-23 (×7): 3.375 g via INTRAVENOUS
  Filled 2015-10-21 (×10): qty 50

## 2015-10-21 MED ORDER — WARFARIN SODIUM 5 MG PO TABS
10.0000 mg | ORAL_TABLET | Freq: Once | ORAL | Status: AC
  Administered 2015-10-21: 10 mg via ORAL
  Filled 2015-10-21: qty 2

## 2015-10-21 NOTE — Progress Notes (Signed)
ANTICOAGULATION CONSULT NOTE - Initial Consult  Pharmacy Consult for warfarin Indication: mechanical valve  No Known Allergies  Patient Measurements: Height:  (170.2 cm) Weight: 183 lb 6.8 oz (83.2 kg) IBW/kg (Calculated) : 66.1 Heparin Dosing Weight: 81.6 kg  Vital Signs: Temp: 98.1 F (36.7 C) (02/07 0735) Temp Source: Oral (02/07 0735) BP: 103/68 mmHg (02/07 0715) Pulse Rate: 90 (02/07 0715)  Labs:  Recent Labs  10/20/15 1242 10/20/15 1340 10/21/15 0701  HGB 12.2*  --  8.7*  HCT 35.3*  --  24.7*  PLT 533*  --  329  LABPROT  --  25.9* 28.8*  INR  --  2.40* 2.76*  CREATININE 0.93  --  0.81    Estimated Creatinine Clearance: 107.5 mL/min (by C-G formula based on Cr of 0.81).  Assessment: Dennis Bates is a 55 y.o. male admitted on 10/20/2015 with sepsis.  Pharmacy has been consulted for warfarin dosing.  Recent valve replacement 1/26  AC: warfarin for mechanical valve. INR 2.4>>2.76  Home warfarin 10 mg daily  Goal of Therapy:  INR 2-3 Monitor platelets by anticoagulation protocol: Yes   Plan:  Warfarin  tonight x1  Daily INR, CBC Monitor for s/sx of bleeding  Arlean Hopping. Newman Pies, PharmD, BCPS Clinical Pharmacist Pager [phone]  10/21/2015 10:53 AM

## 2015-10-21 NOTE — Progress Notes (Addendum)
[address]       [place] [address]             [phone]             Subjective: Patient feels better this am  Objective: Vital signs in last 24 hours: Temp:  [98 F (36.7 C)-103.2 F (39.6 C)] 98.1 F (36.7 C) (02/07 0735) Pulse Rate:  [90-119] 90 (02/07 0715) Cardiac Rhythm:  [-] Normal sinus rhythm (02/07 0715) Resp:  [16-27] 16 (02/07 0715) BP: (80-117)/(50-79) 103/68 mmHg (02/07 0715) SpO2:  [95 %-100 %] 98 % (02/07 0715) Weight:  [180 lb (81.647 kg)-183 lb 6.8 oz (83.2 kg)] 183 lb 6.8 oz (83.2 kg) (02/06 2050)   Current Weight  10/20/15 183 lb 6.8 oz (83.2 kg)       Intake/Output from previous day: 02/06 0701 - 02/07 0700 In: 640 [P.O.:240; IV Piggyback:400] Out: -    Physical Exam:  Cardiovascular: RRR, no murmur Pulmonary: Clear to auscultation bilaterally; no rales, wheezes, or rhonchi. Abdomen: Soft, non tender, bowel sounds present. Extremities: No lower extremity edema. Wounds: Clean and dry.  No erythema or signs of infection.  Lab Results: CBC: Recent Labs  10/20/15 1242 10/21/15 0701  WBC 17.3* 7.5  HGB 12.2* 8.7*  HCT 35.3* 24.7*  PLT 533* 329   BMET:  Recent Labs  10/20/15 1242 10/21/15 0701  NA 134* 134*  K 4.6 3.4*  CL 100* 104  CO2 19* 21*  GLUCOSE 172* 149*  BUN 10 8  CREATININE 0.93 0.81  CALCIUM 9.8 8.3*    PT/INR:  Lab Results  Component Value Date   INR 2.76* 10/21/2015   INR 2.40* 10/20/2015   INR 1.63* 10/17/2015   ABG:  INR: Will add last result for INR, ABG once components are confirmed Will add last 4 CBG results once components are confirmed  Assessment/Plan:  1. CV - SR. 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 INR 2.76. Dr. Laneta Simmers to evaluate. 2.  Pulmonary - CXR done yesterday showed no pneumothorax, small left pleural effusion, atelectasis but no evidence of PNA. 3.ID-fever to 103.2. On Vanco and Zosyn. UA negative, UC and BC pending. Questionable  etiology of fever. 4.  Acute blood loss anemia - H and H 8.7 and 24.7 5. Supplement potassium 6. DM-CBGs 163/124. On Insulin   ZIMMERMAN,DONIELLE MPA-C 10/21/2015,8:36 AM   Chart reviewed, patient examined, agree with above. Patient is well-known to me after [facility] Procedure with a mechanical valved graft on 10/09/2015. He had a very smooth postop course and was here a couple extra days waiting for his INR to bump. He went home Friday and felt fine Saturday but Sunday evening developed nausea, chills and did not feel well. Monday morning he had fever to 103 and came to the ER. He had a fever of 103 in the ER with a WBC ct of 17K, elevated lactic acid and some hypotension and admitted with diagnosis of sepsis. BC, UC sent and pending. UA normal. CXR ok, incision ok. He was started on empiric vanc and Zosyn and WBC ct this am is 7K. No further fevers and feels better. He sounds like he has a URI. Influenza A, B, H1N1 negative. The etiology is undetermined but I would continue empiric antibiotic for now with new valved graft in place. This could be a viral illness and if [facility] remain negative with no other source then we will have to assume it is  viral.

## 2015-10-21 NOTE — Progress Notes (Signed)
Pt transferred to new room(6E23) per MD order via wheelchair. Report called to Adaku/RN, all questions answered, vitals stable at time of transfer. Pt family at bedside and aware of transfer. CCMD also notified of new room assignment. All pt belongings sent with pt

## 2015-10-21 NOTE — Progress Notes (Signed)
PROGRESS NOTE  Dennis Bates ZOX:[id number] DOB: 07-31-1961 DOA: 10/20/2015 PCP: Leanor Rubenstein, MD Brief History 55 year old male with a history of diabetes mellitus type 2, hypertension hyperlipidemia,CAD s/p inferior STEMI and PCI/DES to a 99% OM2 09/2014, known bicuspid aortic valve stenosis presented with one-day history of fever up to 103.51F at home. The patient was recently discharged from the hospital on 10/17/2015 after he underwent aortic valve replacement with a St. Jude mechanical valve on 10/09/2015. Evaluation revealed fever 100.35F, hypotension with systolic blood pressure in the 80s, WBC 17.3, lactic acid 2.41. The patient was started on vancomycin and Zosyn after blood cultures were obtained.  Patient denies fevers, chills, headache, chest pain, dyspnea, nausea, vomiting, diarrhea, abdominal pain, dysuria, hematuria, rashes, synovitis.  Assessment/Plan: Sepsis -Source unclear presently, workup in progress -Continue empiric vancomycin and Zosyn pending blood culture data -Urinalysis negative for pyuria -Chest x-ray without clear infiltrate -Lactic acid 2.40 -Continue intravenous fluids Severe aortic stenosis status post St. Jude mechanical valve -Continue Coumadin -Appreciate TCTS  followup -INR 2.40 on the day of admission Coronary artery disease with history of STEMI -No angina presently -Continue aspirin -restart carvedilol as blood pressure allows--held presently in setting of hypotension and soft BPs -Continue statin -EKG without concerning ischemic changes Diabetes mellitus type 2 -Discontinue metformin for now -NovoLog sliding scale -10/07/15 hemoglobin A1c 8.9 Hyperlipidemia -Continue statin Hypertension -Blood pressure is soft -Restart carvedilol as blood pressure allows Hypokalemia -Replete -Check magnesium   Family Communication:   Wife updated at beside Disposition Plan:   Home when medically stable       Procedures/Studies: Dg Chest  2 View  10/20/2015  CLINICAL DATA:  Status post recent heart valve and aorta surgery. Newly developed fever. EXAM: CHEST  2 VIEW COMPARISON:  Chest x-rays dated 10/11/2015 and 10/07/2015. FINDINGS: Persistent streaky opacities are seen within the lower lobes bilaterally, not significantly changed, most suggestive of atelectasis. Small left pleural effusion again noted. No new lung findings. No pneumothorax. Cardiomediastinal silhouette is stable in size and configuration. Median sternotomy wires appear intact and stable alignment. Valvular hardware is stable in position. No acute osseous abnormality. Soft tissue structures about the chest are unremarkable. IMPRESSION: Streaky/linear opacities at each lung base, most likely atelectasis. Also persistent small left pleural effusion. Postsurgical changes as above. No convincing evidence of a developing pneumonia. Electronically Signed   By: Bary Richard M.D.   On: 10/20/2015 13:00   Dg Chest 2 View  10/07/2015  CLINICAL DATA:  Preoperative exam prior to Bentall procedure. , known Coronary artery disease, aortic stenosis, diabetes, and hyperlipidemia ; also aortic root dilation. EXAM: CHEST  2 VIEW COMPARISON:  CT scan of the chest of January 11th 2017. FINDINGS: The lungs are adequately inflated and clear. The heart and pulmonary vascularity are normal. There is mild tortuosity of the ascending thoracic aorta. There is no pleural effusion. The bony thorax is unremarkable. IMPRESSION: There is no active cardiopulmonary disease. There is known mild prominence of the ascending aorta. Electronically Signed   By: Naim Murtha  [place] M.D.   On: 10/07/2015 12:22   Ct Angio Chest W/cm &/or Wo Cm  09/24/2015  CLINICAL DATA:  55 year old male with history of aortic stenosis. Shortness of breath for several months. EXAM: CT ANGIOGRAPHY CHEST WITH CONTRAST TECHNIQUE: Multidetector CT imaging of the chest was performed using the standard protocol during bolus administration of  intravenous contrast. Multiplanar CT image reconstructions and MIPs were obtained to evaluate the vascular  anatomy. CONTRAST:  100 mL of Isovue 370. COMPARISON:  No priors. FINDINGS: Creatinine was obtained on site at [facility] at [address] Results: Creatinine 0.7 mg/dL. Mediastinum/Lymph Nodes: Heart size is normal. There is no significant pericardial fluid, thickening or pericardial calcification. There is atherosclerosis of the thoracic aorta, the great vessels of the mediastinum and the coronary arteries, including calcified atherosclerotic plaque in the left circumflex coronary artery. Severe thickening and calcification of the aortic valve. Mild aneurysmal dilatation of the ascending thoracic aorta which measures up to 4.6 cm in diameter. The arch is normal in caliber measuring 2.9 cm in diameter just distal to the origin of the left subclavian artery. Descending thoracic aorta is also normal in caliber measuring 2.4 cm in diameter. No evidence of thoracic aortic dissection. Normal three-vessel right-sided aortic arch. No pathologically enlarged mediastinal or hilar lymph nodes. Esophagus is unremarkable in appearance. No axillary lymphadenopathy. Lungs/Pleura: No suspicious appearing pulmonary nodules or masses. No acute consolidative airspace disease. No pleural effusions. No pneumothorax. Upper Abdomen: Diffuse low attenuation throughout the hepatic parenchyma, compatible with severe hepatic steatosis. Intermediate to high attenuation material layering dependently in the gallbladder, compatible with biliary sludge and/or noncalcified gallstones. No current findings to suggest an acute cholecystitis at this time. Musculoskeletal/Soft Tissues: There are no aggressive appearing lytic or blastic lesions noted in the visualized portions of the skeleton. Review of the MIP images confirms the above findings. IMPRESSION: 1. Severely thickened and heavily calcified aortic valve, compatible with  the reported clinical history of aortic stenosis. This is associated with mild aneurysmal dilatation of the ascending thoracic aorta which currently measures 4.6 cm in diameter. 2. Normal right sided 3 vessel aortic arch. 3. Atherosclerosis, including left circumflex coronary artery disease. Please note that although the presence of coronary artery calcium documents the presence of coronary artery disease, the severity of this disease and any potential stenosis cannot be assessed on this non-gated CT examination. Assessment for potential risk factor modification, dietary therapy or pharmacologic therapy may be warranted, if clinically indicated. 4. Severe hepatic steatosis. 5. Biliary sludge and/or noncalcified gallstones lying dependently in the gallbladder. No findings to suggest an acute cholecystitis at this time. Electronically Signed   By: Trudie Reed M.D.   On: 09/24/2015 14:47   Dg Chest Port 1 View  10/11/2015  CLINICAL DATA:  Status post aortic valve replacement EXAM: PORTABLE CHEST 1 VIEW COMPARISON:  10/10/2015 FINDINGS: Cardiac shadow is mildly enlarged but stable. There is been interval removal of the Swan-Ganz catheter and mediastinal drains. Bibasilar atelectatic changes are noted and slightly increased from the prior exam. No pneumothorax is same. A right jugular sheath remains in place. IMPRESSION: Interval removal of tubes and lines as described. Slight increase in bibasilar atelectatic changes. Electronically Signed   By: Alcide Clever M.D.   On: 10/11/2015 07:34   Dg Chest Port 1 View  10/10/2015  CLINICAL DATA:  Status post CABG on 26 January EXAM: PORTABLE CHEST 1 VIEW COMPARISON:  Portable chest x-ray of 09 October 2015. FINDINGS: The trachea and esophagus have been extubated. The lungs are mildly hypoinflated. There is mild bibasilar subsegmental atelectasis. There is no significant pleural effusion and there is no pneumothorax. The heart is mildly enlarged. A prosthetic aortic  valve ring is visible. The pulmonary vascularity is not engorged. The pulmonary interstitial edema has resolved. The Swan-Ganz catheter tip projects in the proximal right main pulmonary artery. A mediastinal drain and a left lower medial chest tube remain. All  7 sternal wires are intact. IMPRESSION: Interval resolution of interstitial edema. Persistent mild bibasilar subsegmental atelectasis. No significant pleural effusion. The remaining support tubes are in reasonable position. Electronically Signed   By: Liza Czerwinski  [place] M.D.   On: 10/10/2015 07:53   Dg Chest Port 1 View  10/09/2015  CLINICAL DATA:  Immediate postop Bentall procedure (aortic valve replacement) using a Saint Jude mechanical valve. EXAM: PORTABLE CHEST 1 VIEW COMPARISON:  10/07/2015 and earlier. FINDINGS: Endotracheal tube tip in satisfactory position projecting approximately 4 cm above the carina. Right jugular Swan-Ganz catheter tip projects at the proximal right main pulmonary artery. Nasogastric tube courses below the diaphragm into the stomach. Mediastinal drainage tube. Sternotomy for aortic valve replacement. Suboptimal inspiration with dense left lower lobe atelectasis and moderate atelectasis at the right lung base. Linear atelectasis in the right upper lobe and lingula. Pulmonary vascularity normal without evidence pulmonary edema. Small bilateral pleural effusions suspected. IMPRESSION: 1. Support apparatus satisfactory. 2. Suboptimal inspiration. Dense left lower lobe atelectasis. Moderate atelectasis at the right lung base and linear atelectasis in the right upper lobe and lingula. 3. Probable small bilateral pleural effusions. Electronically Signed   By: Hulan Saas M.D.   On: 10/09/2015 14:46         Subjective: Patient denies fevers, chills, headache, chest pain, dyspnea, nausea, vomiting, diarrhea, abdominal pain, dysuria, hematuria   Objective: Filed Vitals:   10/21/15 0400 10/21/15 0425 10/21/15 0715 10/21/15  0735  BP: 117/77  103/68   Pulse: 98  90   Temp:  98 F (36.7 C)  98.1 F (36.7 C)  TempSrc:  Oral  Oral  Resp: 24  16   Height:      Weight:      SpO2: 98%  98%     Intake/Output Summary (Last 24 hours) at 10/21/15 0814 Last data filed at 10/21/15 1610  Gross per 24 hour  Intake    640 ml  Output      0 ml  Net    640 ml   Weight change:  Exam:   General:  Pt is alert, follows commands appropriately, not in acute distress  HEENT: No icterus, No thrush, No neck mass, [place]/AT  Cardiovascular: RRR, S1/S2, no rubs, no gallops  Respiratory: Left basilar crackles, R-CTA; no wheeze  Abdomen: Soft/+BS, non tender, non distended, no guarding;no hepatosplenomegaly  Extremities: No edema, No lymphangitis, No petechiae, No rashes, no synovitis; no clubbing or cyanosis  Data Reviewed: Basic Metabolic Panel:  Recent Labs Lab 10/20/15 1242 10/21/15 0701  NA 134* 134*  K 4.6 3.4*  CL 100* 104  CO2 19* 21*  GLUCOSE 172* 149*  BUN 10 8  CREATININE 0.93 0.81  CALCIUM 9.8 8.3*   Liver Function Tests:  Recent Labs Lab 10/20/15 1242  AST 54*  ALT 37  ALKPHOS 109  BILITOT 1.2  PROT 7.1  ALBUMIN 3.8   No results for input(s): LIPASE, AMYLASE in the last 168 hours. No results for input(s): AMMONIA in the last 168 hours. CBC:  Recent Labs Lab 10/20/15 1242 10/21/15 0701  WBC 17.3* 7.5  NEUTROABS 16.2*  --   HGB 12.2* 8.7*  HCT 35.3* 24.7*  MCV 84.7 84.6  PLT 533* 329   Cardiac Enzymes: No results for input(s): CKTOTAL, CKMB, CKMBINDEX, TROPONINI in the last 168 hours. BNP: Invalid input(s): POCBNP CBG:  Recent Labs Lab 10/15/15 0615 10/15/15 1126 10/15/15 1631 10/20/15 2259 10/21/15 0427  GLUCAP 141* 167* 148* 163* 124*    No  results found for this or any previous visit (from the past 240 hour(s)).   Scheduled Meds: . aspirin EC  81 mg Oral q morning - 10a  . atorvastatin  40 mg Oral QHS  . carvedilol  3.125 mg Oral BID WC  . fenofibrate  160  mg Oral q morning - 10a  . insulin aspart  0-15 Units Subcutaneous TID WC  . insulin aspart  0-5 Units Subcutaneous QHS  . piperacillin-tazobactam  3.375 g Intravenous 3 times per day  . sodium chloride flush  3 mL Intravenous Q12H  . vancomycin  1,000 mg Intravenous Q8H  . Warfarin - Pharmacist Dosing Inpatient   Does not apply q1800   Continuous Infusions:    Ayelen Sciortino, DO  [facility] Pager [phone]  If 7PM-7AM, please contact night-coverage [email or web address] Password [email or web address] 10/21/2015, 8:14 AM   LOS: 1 day

## 2015-10-22 DIAGNOSIS — A419 Sepsis, unspecified organism: Secondary | ICD-10-CM | POA: Diagnosis not present

## 2015-10-22 DIAGNOSIS — B349 Viral infection, unspecified: Secondary | ICD-10-CM | POA: Diagnosis not present

## 2015-10-22 DIAGNOSIS — R5082 Postprocedural fever: Secondary | ICD-10-CM | POA: Diagnosis not present

## 2015-10-22 DIAGNOSIS — E1165 Type 2 diabetes mellitus with hyperglycemia: Secondary | ICD-10-CM | POA: Diagnosis not present

## 2015-10-22 LAB — GLUCOSE, CAPILLARY
Glucose-Capillary: 131 mg/dL — ABNORMAL HIGH (ref 65–99)
Glucose-Capillary: 156 mg/dL — ABNORMAL HIGH (ref 65–99)
Glucose-Capillary: 148 mg/dL — ABNORMAL HIGH (ref 65–99)
Glucose-Capillary: 188 mg/dL — ABNORMAL HIGH (ref 65–99)

## 2015-10-22 LAB — RESPIRATORY VIRUS PANEL
Adenovirus: NEGATIVE
Influenza A: NEGATIVE
Influenza B: NEGATIVE
Metapneumovirus: NEGATIVE
Parainfluenza 1: NEGATIVE
Parainfluenza 2: NEGATIVE
Parainfluenza 3: NEGATIVE
Respiratory Syncytial Virus A: NEGATIVE
Respiratory Syncytial Virus B: NEGATIVE
Rhinovirus: NEGATIVE

## 2015-10-22 LAB — COMPREHENSIVE METABOLIC PANEL
ALT: 59 U/L (ref 17–63)
AST: 27 U/L (ref 15–41)
Albumin: 3 g/dL — ABNORMAL LOW (ref 3.5–5.0)
Alkaline Phosphatase: 65 U/L (ref 38–126)
Anion gap: 9 (ref 5–15)
BUN: 8 mg/dL (ref 6–20)
CO2: 24 mmol/L (ref 22–32)
Calcium: 8.9 mg/dL (ref 8.9–10.3)
Chloride: 105 mmol/L (ref 101–111)
Creatinine, Ser: 0.91 mg/dL (ref 0.61–1.24)
GFR calc Af Amer: >60 mL/min (ref >60)
GFR calc non Af Amer: >60 mL/min (ref >60)
Glucose, Bld: 159 mg/dL — ABNORMAL HIGH (ref 65–99)
Potassium: 3.8 mmol/L (ref 3.5–5.1)
Sodium: 138 mmol/L (ref 135–145)
Total Bilirubin: 0.7 mg/dL (ref 0.3–1.2)
Total Protein: 5.8 g/dL — ABNORMAL LOW (ref 6.5–8.1)

## 2015-10-22 LAB — CBC
HCT: 27.7 % — ABNORMAL LOW (ref 39.0–52.0)
Hemoglobin: 9.6 g/dL — ABNORMAL LOW (ref 13.0–17.0)
MCH: 29.5 pg (ref 26.0–34.0)
MCHC: 34.7 g/dL (ref 30.0–36.0)
MCV: 85.2 fL (ref 78.0–100.0)
Platelets: 365 10*3/uL (ref 150–400)
RBC: 3.25 MIL/uL — ABNORMAL LOW (ref 4.22–5.81)
RDW: 12.7 % (ref 11.5–15.5)
WBC: 5.7 10*3/uL (ref 4.0–10.5)

## 2015-10-22 LAB — PROTIME-INR
INR: 2.3 — ABNORMAL HIGH (ref 0.00–1.49)
Prothrombin Time: 25.1 seconds — ABNORMAL HIGH (ref 11.6–15.2)

## 2015-10-22 MED ORDER — WARFARIN SODIUM 10 MG PO TABS
10.0000 mg | ORAL_TABLET | Freq: Once | ORAL | Status: AC
  Administered 2015-10-22: 10 mg via ORAL
  Filled 2015-10-22: qty 1

## 2015-10-22 NOTE — Progress Notes (Signed)
PROGRESS NOTE    Dennis Bates ZOX:[id number] DOB: Jul 25, 1961 DOA: 10/20/2015 PCP: Leanor Rubenstein, MD  HPI/Brief narrative 55 year old male with a history of diabetes mellitus type 2, hypertension hyperlipidemia,CAD s/p inferior STEMI and PCI/DES to a 99% OM2 09/2014, known bicuspid aortic valve stenosis presented with one-day history of fever up to 103.41F at home. The patient was recently discharged from the hospital on 10/17/2015 after he underwent aortic valve replacement with a St. Jude mechanical valve on 10/09/2015. Evaluation revealed fever 100.36F, hypotension with systolic blood pressure in the 80s, WBC 17.3, lactic acid 2.41. The patient was started on vancomycin and Zosyn after blood cultures were obtained.   Assessment/Plan:   1. Sepsis: Present on admission. Unclear source. Initially felt to be due to viral URI. One of 2 blood cultures 10/20/15: Gram-positive cocci in clusters. Second blood culture: Negative to date. MRSA PCR negative. Urine culture: Insignificant colonies. RSV panel: Pending. Lactate has normalized. Flu panel PCR: Negative. Follow final blood culture results. Continue IV vancomycin and Zosyn. Sepsis physiology resolved. 2. Severe AS, status post St. Jude's mechanical valve: Sternotomy site has healed well without evidence of infection. INR 2.3. Continue Coumadin per pharmacy. Cardiothoracic surgery follow-up appreciated. 3. Hypokalemia: Replaced. 4. Anemia: Hemoglobin on 1/30:12. Admitted 2/6 with hemoglobin 12.2. Hemoglobin stable. Etiology? Acute blood loss versus acute illness. 5. Essential hypertension: Controlled. 6. DM 2: A1c 8.9. Mildly uncontrolled and fluctuating CBGs. Continue SSI. 7. HLD: Statins 8. CAD: No reported chest pain. Continue aspirin, statin, fenofibrate and resume carvedilol as blood pressure tolerates.   DVT prophylaxis: Anticoagulated on Coumadin Code Status: Full Family Communication: None at bedside Disposition Plan: DC home when  medically stable   Consultants:  Cardiothoracic surgery  Procedures:  None  Antimicrobials:  IV Zosyn 2/6 >  IV vancomycin 2/6 >   Subjective: Denies complaints. States that he feels much better. Some sinus stuffiness but denies cough, chest pain or dyspnea. No nausea, vomiting or diarrhea. No dysuria or urinary frequency.  Objective: Filed Vitals:   10/21/15 1910 10/21/15 2058 10/22/15 0521 10/22/15 0837  BP: 111/75 100/71 116/72 107/72  Pulse: 93 88 85 85  Temp:  100 F (37.8 C) 98.6 F (37 C) 98.2 F (36.8 C)  TempSrc:  Oral Oral Oral  Resp: 21 20 20 20   Height:      Weight:      SpO2: 96% 94% 99% 99%    Intake/Output Summary (Last 24 hours) at 10/22/15 1719 Last data filed at 10/22/15 0900  Gross per 24 hour  Intake    490 ml  Output      0 ml  Net    490 ml   Filed Weights   10/20/15 1216 10/20/15 2050  Weight: 81.647 kg (180 lb) 83.2 kg (183 lb 6.8 oz)    Exam:  General exam: Pleasant middle-aged male sitting up comfortably in chair this morning. Respiratory system: Clear. No increased work of breathing. Healing midline sternotomy scar. Cardiovascular system: S1 & S2 heard, RRR. No JVD, murmurs, gallops, clicks or pedal edema. Telemetry: Sinus rhythm. Gastrointestinal system: Abdomen is nondistended, soft and nontender. Normal bowel sounds heard. Central nervous system: Alert and oriented. No focal neurological deficits. Extremities: Symmetric 5 x 5 power.   Data Reviewed: Basic Metabolic Panel:  Recent Labs Lab 10/20/15 1242 10/21/15 0701 10/22/15 0636  NA 134* 134* 138  K 4.6 3.4* 3.8  CL 100* 104 105  CO2 19* 21* 24  GLUCOSE 172* 149* 159*  BUN 10 8 8  CREATININE 0.93 0.81 0.91  CALCIUM 9.8 8.3* 8.9   Liver Function Tests:  Recent Labs Lab 10/20/15 1242 10/22/15 0636  AST 54* 27  ALT 37 59  ALKPHOS 109 65  BILITOT 1.2 0.7  PROT 7.1 5.8*  ALBUMIN 3.8 3.0*   No results for input(s): LIPASE, AMYLASE in the last 168  hours. No results for input(s): AMMONIA in the last 168 hours. CBC:  Recent Labs Lab 10/20/15 1242 10/21/15 0701 10/22/15 0636  WBC 17.3* 7.5 5.7  NEUTROABS 16.2*  --   --   HGB 12.2* 8.7* 9.6*  HCT 35.3* 24.7* 27.7*  MCV 84.7 84.6 85.2  PLT 533* 329 365   Cardiac Enzymes: No results for input(s): CKTOTAL, CKMB, CKMBINDEX, TROPONINI in the last 168 hours. BNP (last 3 results) No results for input(s): PROBNP in the last 8760 hours. CBG:  Recent Labs Lab 10/21/15 1527 10/21/15 2141 10/22/15 0756 10/22/15 1146 10/22/15 1633  GLUCAP 206* 109* 131* 156* 148*    Recent Results (from the past 240 hour(s))  Culture, blood (routine x 2)     Status: None (Preliminary result)   Collection Time: 10/20/15 12:30 PM  Result Value Ref Range Status   Specimen Description BLOOD RIGHT ANTECUBITAL  Final   Special Requests   Final    BOTTLES DRAWN AEROBIC AND ANAEROBIC 5CC AER 8CC ANA   Culture  Setup Time   Final    GRAM POSITIVE COCCI IN CLUSTERS AEROBIC BOTTLE ONLY CRITICAL RESULT CALLED TO, READ BACK BY AND VERIFIED WITH: Cathe Mons ON 6E  10/22/15 MKELLY    Culture CULTURE REINCUBATED FOR BETTER GROWTH  Final   Report Status PENDING  Incomplete  Urine culture     Status: None   Collection Time: 10/20/15  1:19 PM  Result Value Ref Range Status   Specimen Description URINE, CLEAN CATCH  Final   Special Requests NONE  Final   Culture 9,000 COLONIES/mL INSIGNIFICANT GROWTH  Final   Report Status 10/21/2015 FINAL  Final  Culture, blood (routine x 2)     Status: None (Preliminary result)   Collection Time: 10/20/15  1:50 PM  Result Value Ref Range Status   Specimen Description BLOOD RIGHT WRIST  Final   Special Requests BOTTLES DRAWN AEROBIC AND ANAEROBIC 5CC  Final   Culture NO GROWTH 2 DAYS  Final   Report Status PENDING  Incomplete         Studies: No results found.      Scheduled Meds: . aspirin EC  81 mg Oral q morning - 10a  . atorvastatin  40 mg Oral  QHS  . fenofibrate  160 mg Oral q morning - 10a  . insulin aspart  0-15 Units Subcutaneous TID WC  . insulin aspart  0-5 Units Subcutaneous QHS  . piperacillin-tazobactam  3.375 g Intravenous 3 times per day  . sodium chloride flush  3 mL Intravenous Q12H  . vancomycin  1,000 mg Intravenous Q8H  . warfarin  10 mg Oral ONCE-1800  . Warfarin - Pharmacist Dosing Inpatient   Does not apply q1800   Continuous Infusions:   Active Problems:   Diabetes type 2, uncontrolled (HCC)   Aortic stenosis due to bicuspid aortic valve   Fever   Leukocytosis   Type 2 diabetes mellitus with hyperglycemia (HCC)   Sepsis (HCC)   Pyrexia    Time spent: 25 minutes.    Marcellus Scott, MD, FACP, FHM. [facility] Pager [phone]  If 7PM-7AM, please contact night-coverage [email or web address] Password  [email or web address] 10/22/2015, 5:19 PM    LOS: 2 days

## 2015-10-22 NOTE — Progress Notes (Addendum)
      [address]       [place] [address]             [phone]             Subjective: Patient continues to feel better.  Objective: Vital signs in last 24 hours: Temp:  [98.1 F (36.7 C)-100 F (37.8 C)] 98.6 F (37 C) (02/08 0521) Pulse Rate:  [85-93] 85 (02/08 0521) Cardiac Rhythm:  [-] Normal sinus rhythm (02/07 2016) Resp:  [15-26] 20 (02/08 0521) BP: (100-116)/(62-75) 116/72 mmHg (02/08 0521) SpO2:  [94 %-100 %] 99 % (02/08 0521)   Current Weight  10/20/15 183 lb 6.8 oz (83.2 kg)       Intake/Output from previous day: 02/07 0701 - 02/08 0700 In: 1150 [P.O.:600; IV Piggyback:550] Out: 0    Physical Exam:  Cardiovascular: RRR, no murmur Pulmonary: Clear to auscultation bilaterally; no rales, wheezes, or rhonchi. Abdomen: Soft, non tender, bowel sounds present. Extremities: No lower extremity edema. Wounds: Clean and dry.  No erythema or signs of infection.  Lab Results: CBC:  Recent Labs  10/21/15 0701 10/22/15 0636  WBC 7.5 5.7  HGB 8.7* 9.6*  HCT 24.7* 27.7*  PLT 329 365   BMET:   Recent Labs  10/20/15 1242 10/21/15 0701  NA 134* 134*  K 4.6 3.4*  CL 100* 104  CO2 19* 21*  GLUCOSE 172* 149*  BUN 10 8  CREATININE 0.93 0.81  CALCIUM 9.8 8.3*    PT/INR:  Lab Results  Component Value Date   INR 2.76* 10/21/2015   INR 2.40* 10/20/2015   INR 1.63* 10/17/2015   ABG:  INR: Will add last result for INR, ABG once components are confirmed Will add last 4 CBG results once components are confirmed  Assessment/Plan:  1. CV - SR. 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 INR 2.76. Dr. Laneta Simmers to evaluate. 2.  Pulmonary - CXR done yesterday showed no pneumothorax, small left pleural effusion, atelectasis but no evidence of PNA. 3.ID-fever to 100 last evening. On empiric Vanco and Zosyn. UA negative, UC showed 9,000 insignificant growth. Preliminary BC showed no growth. RSV panel pending. Etiology may be  a viral syndrome.  4.  Acute blood loss anemia - H and H stable at 9.6 and 27.7 6. DM-CBGs 159/206/109. On Insulin   ZIMMERMAN,DONIELLE MPA-C 10/22/2015,8:13 AM    Chart reviewed, patient examined, agree with above. One out of two blood cultures drawn on admission grew G+ cocci in clusters. The significance of this is unclear. He remains afebrile and WBC dropped further to 5.7 today. I think it would be worthwhile having ID see him to make recommendations concerning need for continued antibiotics after discharge. I don't know how he would have gotten a G+ bacteremia unless it was from an IV site in the hospital just prior to discharge. If this is a real bacteremia I think it is a good sign that his fever and leukocytosis resolved so quickly on antibiotics.

## 2015-10-22 NOTE — Progress Notes (Signed)
Late Entry:  Pt. Has gram + cocci in clusters growing in the aerobic bottle. NP on call notified.

## 2015-10-22 NOTE — Progress Notes (Signed)
ANTICOAGULATION CONSULT NOTE - Follow Up Consult  Pharmacy Consult for Warfarin Indication: St. Jude AVR  No Known Allergies  Patient Measurements: Height:  (170.2 cm) Weight: 183 lb 6.8 oz (83.2 kg) IBW/kg (Calculated) : 66.1  Vital Signs: Temp: 98.2 F (36.8 C) (02/08 0837) Temp Source: Oral (02/08 0837) BP: 107/72 mmHg (02/08 0837) Pulse Rate: 85 (02/08 0837)  Labs:  Recent Labs  10/20/15 1242 10/20/15 1340 10/21/15 0701 10/22/15 0636  HGB 12.2*  --  8.7* 9.6*  HCT 35.3*  --  24.7* 27.7*  PLT 533*  --  329 365  LABPROT  --  25.9* 28.8* 25.1*  INR  --  2.40* 2.76* 2.30*  CREATININE 0.93  --  0.81 0.91    Estimated Creatinine Clearance: 95.7 mL/min (by C-G formula based on Cr of 0.91).   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 Pharmacy asked to resume warfarin dosing this admission.   INR this morning remains therapeutic (INR 2.3, goal of 2-3 with mech AVR with no additional risk factors). CBC stable - no overt s/sx of bleeding noted.   Goal of Therapy:  INR 2-3   Plan:  1. Warfarin 10 mg x 1 dose at 1800 today 2. Will continue to monitor for any signs/symptoms of bleeding and will follow up with PT/INR in the a.m.   Georgina Pillion, PharmD, BCPS Clinical Pharmacist Pager: [phone] 10/22/2015 11:49 AM

## 2015-10-22 NOTE — Care Management Note (Signed)
Case Management Note  Patient Details  Name: Dennis Bates MRN: [id number] Date of Birth: [birth year]  Subjective/Objective:   Patient from home with spouse, NCM will cont to follow for dc needs.                 Action/Plan:   Expected Discharge Date:                  Expected Discharge Plan:  Home/Self Care  In-House Referral:     Discharge planning Services  CM Consult  Post Acute Care Choice:    Choice offered to:     DME Arranged:    DME Agency:     HH Arranged:    HH Agency:     Status of Service:  In process, will continue to follow  Medicare Important Message Given:    Date Medicare IM Given:    Medicare IM give by:    Date Additional Medicare IM Given:    Additional Medicare Important Message give by:     If discussed at Long Length of Stay Meetings, dates discussed:    Additional Comments:  Leone Haven, RN 10/22/2015, 12:56 PM

## 2015-10-23 ENCOUNTER — Other Ambulatory Visit: Payer: Self-pay | Admitting: Internal Medicine

## 2015-10-23 DIAGNOSIS — E1165 Type 2 diabetes mellitus with hyperglycemia: Secondary | ICD-10-CM

## 2015-10-23 DIAGNOSIS — R509 Fever, unspecified: Secondary | ICD-10-CM

## 2015-10-23 DIAGNOSIS — A419 Sepsis, unspecified organism: Secondary | ICD-10-CM | POA: Diagnosis not present

## 2015-10-23 DIAGNOSIS — R5082 Postprocedural fever: Secondary | ICD-10-CM | POA: Diagnosis not present

## 2015-10-23 DIAGNOSIS — B349 Viral infection, unspecified: Secondary | ICD-10-CM

## 2015-10-23 LAB — CULTURE, BLOOD (ROUTINE X 2)

## 2015-10-23 LAB — PROTIME-INR
INR: 2.46 — ABNORMAL HIGH (ref 0.00–1.49)
Prothrombin Time: 26.3 seconds — ABNORMAL HIGH (ref 11.6–15.2)

## 2015-10-23 LAB — GLUCOSE, CAPILLARY
Glucose-Capillary: 156 mg/dL — ABNORMAL HIGH (ref 65–99)
Glucose-Capillary: 147 mg/dL — ABNORMAL HIGH (ref 65–99)

## 2015-10-23 MED ORDER — CEFAZOLIN SODIUM-DEXTROSE 2-3 GM-% IV SOLR
2.0000 g | Freq: Three times a day (TID) | INTRAVENOUS | Status: DC
  Filled 2015-10-23 (×2): qty 50

## 2015-10-23 MED ORDER — ATORVASTATIN CALCIUM 80 MG PO TABS: 40.0000 mg | ORAL_TABLET | Freq: Every day | ORAL | Status: DC

## 2015-10-23 NOTE — Progress Notes (Addendum)
      [address]       [place] [address]             [phone]             Subjective: Patient without complaints.  Objective: Vital signs in last 24 hours: Temp:  [97.8 F (36.6 C)-98.6 F (37 C)] 97.8 F (36.6 C) (02/09 0743) Pulse Rate:  [79-86] 83 (02/09 0743) Cardiac Rhythm:  [-]  Resp:  [17-20] 18 (02/09 0743) BP: (107-128)/(74-84) 107/75 mmHg (02/09 0743) SpO2:  [97 %-99 %] 98 % (02/09 0743)   Current Weight  10/20/15 183 lb 6.8 oz (83.2 kg)       Intake/Output from previous day: 02/08 0701 - 02/09 0700 In: 730 [P.O.:480; IV Piggyback:250] Out: 0    Physical Exam:  Cardiovascular: RRR, no murmur Pulmonary: Clear to auscultation bilaterally; no rales, wheezes, or rhonchi. Abdomen: Soft, non tender, bowel sounds present. Extremities: No lower extremity edema. Wounds: Clean and dry.  No erythema or signs of infection.  Lab Results: CBC:  Recent Labs  10/21/15 0701 10/22/15 0636  WBC 7.5 5.7  HGB 8.7* 9.6*  HCT 24.7* 27.7*  PLT 329 365   BMET:   Recent Labs  10/21/15 0701 10/22/15 0636  NA 134* 138  K 3.4* 3.8  CL 104 105  CO2 21* 24  GLUCOSE 149* 159*  BUN 8 8  CREATININE 0.81 0.91  CALCIUM 8.3* 8.9    PT/INR:  Lab Results  Component Value Date   INR 2.46* 10/23/2015   INR 2.30* 10/22/2015   INR 2.76* 10/21/2015   ABG:  INR: Will add last result for INR, ABG once components are confirmed Will add last 4 CBG results once components are confirmed  Assessment/Plan:  1. CV - SR. 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 INR 2.46. Dr. Laneta Simmers to evaluate. 2.  Pulmonary - On room air 3.ID-afebrile last 24 hours. On empiric Vanco and Zosyn. Virus panel negative but one blood cultures showed gram positive cocci in clusters-questionable from IV? 4.  Acute blood loss anemia - H and H stable at 9.6 and 27.7 6. DM-CBGs 159/206/109. On Insulin  7. ID consult  ZIMMERMAN,DONIELLE  MPA-C 10/23/2015,9:24 AM     Chart reviewed, patient examined, agree with above. He has only had one out of four blood culture bottles positive and it is coag neg staph so I would have to think that this is probably contamination. He has had no further fevers. I discussed the case with Dr. Drue Second and I think a TEE would not be helpful at this point early postop especially with a mechanical valve and given the rapid resolution of his fever and leukocytosis. We have decided to stop his antibiotics and send him home with follow up on Monday with Dr. Drue Second in clinic with surveillance cultures at that time. I don't think a longer course of antibiotics is warranted with the information that we have. If he develops recurrent fever, chills, feeling poorly or leukocytosis then he will have to be readmitted for antibiotics and TEE. I discussed the plan with him and he is in agreement. He feels well and is anxious to go home. I will see him in the office in a week.

## 2015-10-23 NOTE — Discharge Summary (Signed)
Physician Discharge Summary  Dennis Bates VHQ:[id number] DOB: [birth year] DOA: 10/20/2015  PCP: Leanor Rubenstein, MD  Admit date: 10/20/2015 Discharge date: 10/23/2015  Time spent: Less than 30 minutes  Recommendations for Outpatient Follow-up:  1. [facility] on 10/27/15-patient advised to go for surveillance blood culture draw. Please follow final blood culture results that were sent from the hospital. 2. Dr. Judyann Munson, Infectious Disease: Follow-up regarding surveillance blood cultures and further workup or evaluation as deemed necessary. 3. Coumadin clinic: Patient advised to call for an appointment to be seen early next week for lab draws (PT, INR & CBC) and Coumadin dose adjustment as needed. Patient states that he had an appointment on 2/6 which he missed because of current hospitalization and has contact details at home. 4. Recommend repeating chest x-ray in 3-4 weeks to ensure resolution of atelectasis and pleural effusion.  Discharge Diagnoses:  Active Problems:   Diabetes type 2, uncontrolled (HCC)   Aortic stenosis due to bicuspid aortic valve   Fever   Leukocytosis   Type 2 diabetes mellitus with hyperglycemia (HCC)   Sepsis (HCC)   Pyrexia   Discharge Condition: Improved & Stable  Diet recommendation: Heart healthy and diabetic diet.  Filed Weights   10/20/15 1216 10/20/15 2050  Weight: 81.647 kg (180 lb) 83.2 kg (183 lb 6.8 oz)    History of present illness:  55 year old male with a history of diabetes mellitus type 2, hypertension hyperlipidemia,CAD s/p inferior STEMI and PCI/DES to a 99% OM2 09/2014, known bicuspid aortic valve stenosis, underwent AVR with [employer]. Jude's mechanical valve on 10/09/15, presented with one-day history of fever up to 103.39F and possible nasal congestion at home. The patient was recently discharged from the hospital on 10/17/2015 after heart valve replacement. Evaluation revealed fever 100.36F, hypotension with  systolic blood pressure in the 80s, WBC 17.3, lactic acid 2.41.  Hospital Course:   1. Sepsis: Present on admission. Unclear source but felt to be viral from URTI. Cultures were drawn and patient was empirically started on broad-spectrum IV antibiotics including vancomycin and Zosyn. He quickly improved with resolution of his symptoms, defervesced and leukocytosis resolved. MRSA PCR negative. Urine culture: Insignificant colonies. RSV panel: Negative. Lactate has normalized. Flu panel PCR: Negative. One of 2 blood cultures shows Coagulase negative staph which may have been a contaminant. However given his recent valve replacement surgery, there was concern regarding true bacteremia. Thereby as per thoracic surgery recommendations, infectious disease was consulted. TEE was being considered. However, Dr. Laneta Simmers, TCTS and Dr. Drue Second, ID have discussed the case and felt that TEE would not be useful soon after his valve surgery. There recommendation at this time is to stop all antibiotics, discharge patient home and follow-up on Monday of next week for surveillance blood cultures. Patient has been advised to seek immediate medical attention if he has any recurrence of symptoms and he verbalizes understanding. Sepsis physiology resolved. 2. Severe AS, status post St. Jude's mechanical valve: Sternotomy site has healed well without evidence of infection. INR 2.46. Continue Coumadin. Cardiothoracic surgery follow-up appreciated. 3. Hypokalemia: Replaced. 4. Anemia: Hemoglobin on 1/30:12. Admitted 2/6 with hemoglobin 12.2. Hemoglobin stable. Etiology? Acute blood loss versus acute illness. Follow CBC as outpatient. 5. Essential hypertension: Controlled. Resume carvedilol at discharge. 6. DM 2: A1c 8.9. Mildly uncontrolled and fluctuating CBGs. Continue home oral hypoglycemics at discharge. 7. HLD: Statins 8. CAD: No reported chest pain. Continue aspirin, statin, fenofibrate and resume  carvedilol.    Consultants:  Cardiothoracic surgery  Infectious disease  Procedures:  None  Discharge Exam:  Complaints: Denies complaints.  Filed Vitals:   10/22/15 1705 10/22/15 2126 10/23/15 0410 10/23/15 0743  BP: 107/74 112/74 128/84 107/75  Pulse: 86 85 79 83  Temp: 98.6 F (37 C) 98.1 F (36.7 C) 98.2 F (36.8 C) 97.8 F (36.6 C)  TempSrc: Oral Oral Oral Oral  Resp: Height:      Weight:      SpO2: 97% 99% 99% 98%    General exam: Pleasant middle-aged male sitting up comfortably in chair this morning. Respiratory system: Clear. No increased work of breathing. Healing midline sternotomy scar. Cardiovascular system: S1 & S2 heard, RRR. No JVD, murmurs, gallops or pedal edema. Crisp click of mechanical aortic valve heard. Gastrointestinal system: Abdomen is nondistended, soft and nontender. Normal bowel sounds heard. Central nervous system: Alert and oriented. No focal neurological deficits. Extremities: Symmetric 5 x 5 power.  Discharge Instructions      Discharge Instructions    Call MD for:  difficulty breathing, headache or visual disturbances    Complete by:  As directed      Call MD for:  extreme fatigue    Complete by:  As directed      Call MD for:  persistant dizziness or light-headedness    Complete by:  As directed      Call MD for:  persistant nausea and vomiting    Complete by:  As directed      Call MD for:  redness, tenderness, or signs of infection (pain, swelling, redness, odor or green/yellow discharge around incision site)    Complete by:  As directed      Call MD for:  severe uncontrolled pain    Complete by:  As directed      Call MD for:  temperature >100.4    Complete by:  As directed      Diet - low sodium heart healthy    Complete by:  As directed      Diet Carb Modified    Complete by:  As directed      Increase activity slowly    Complete by:  As directed             Medication List    TAKE these  medications        acetaminophen 325 MG tablet  Commonly known as:  TYLENOL  Take 2 tablets (650 mg total) by mouth every 6 (six) hours as needed for mild pain.     aspirin 81 MG EC tablet  Take 1 tablet (81 mg total) by mouth daily.     atorvastatin 80 MG tablet  Commonly known as:  LIPITOR  Take 0.5 tablets (40 mg total) by mouth at bedtime.     carvedilol 3.125 MG tablet  Commonly known as:  COREG  Take 1 tablet (3.125 mg total) by mouth 2 (two) times daily with a meal.     fenofibrate 160 MG tablet  Take 1 tablet (160 mg total) by mouth daily.     glipiZIDE 5 MG 24 hr tablet  Commonly known as:  GLUCOTROL XL  Take 1 tablet (5 mg total) by mouth daily with breakfast.     metFORMIN 1000 MG tablet  Commonly known as:  GLUCOPHAGE  Take 1,000 mg by mouth 2 (two) times daily with a meal.     oxyCODONE 5 MG immediate release tablet  Commonly known as:  Oxy IR/ROXICODONE  Take  1-2 tablets (5-10 mg total) by mouth every 3 (three) hours as needed for severe pain.     warfarin 10 MG tablet  Commonly known as:  COUMADIN  Take 1 tablet (10 mg total) by mouth daily at 6 PM. Or as directed       Follow-up Information    Follow up with Judyann Munson, MD On 10/27/2015.   Specialty:  Infectious Diseases   Why:  Go to office for surveillance blood cultures to be drawn.   [address] [place] [address] [phone]       Schedule an appointment as soon as possible for a visit with Judyann Munson, MD.   Specialty:  Infectious Diseases   Contact information:   [address] [place] [address] [phone]       Schedule an appointment as soon as possible for a visit with Coumadin clinic.   Why:  For an appointment early next week for lab draws (PT, INR & CBC) and Coumadin dose adjustment.       The results of significant diagnostics from this hospitalization (including imaging, microbiology, ancillary and  laboratory) are listed below for reference.    Significant Diagnostic Studies: Dg Chest 2 View  10/20/2015  CLINICAL DATA:  Status post recent heart valve and aorta surgery. Newly developed fever. EXAM: CHEST  2 VIEW COMPARISON:  Chest x-rays dated 10/11/2015 and 10/07/2015. FINDINGS: Persistent streaky opacities are seen within the lower lobes bilaterally, not significantly changed, most suggestive of atelectasis. Small left pleural effusion again noted. No new lung findings. No pneumothorax. Cardiomediastinal silhouette is stable in size and configuration. Median sternotomy wires appear intact and stable alignment. Valvular hardware is stable in position. No acute osseous abnormality. Soft tissue structures about the chest are unremarkable. IMPRESSION: Streaky/linear opacities at each lung base, most likely atelectasis. Also persistent small left pleural effusion. Postsurgical changes as above. No convincing evidence of a developing pneumonia. Electronically Signed   By: Bary Richard M.D.   On: 10/20/2015 13:00   Dg Chest 2 View  10/07/2015  CLINICAL DATA:  Preoperative exam prior to Bentall procedure. , known Coronary artery disease, aortic stenosis, diabetes, and hyperlipidemia ; also aortic root dilation. EXAM: CHEST  2 VIEW COMPARISON:  CT scan of the chest of January 11th 2017. FINDINGS: The lungs are adequately inflated and clear. The heart and pulmonary vascularity are normal. There is mild tortuosity of the ascending thoracic aorta. There is no pleural effusion. The bony thorax is unremarkable. IMPRESSION: There is no active cardiopulmonary disease. There is known mild prominence of the ascending aorta. Electronically Signed   By: David  [place] M.D.   On: 10/07/2015 12:22   Ct Angio Chest W/cm &/or Wo Cm  09/24/2015  CLINICAL DATA:  55 year old male with history of aortic stenosis. Shortness of breath for several months. EXAM: CT ANGIOGRAPHY CHEST WITH CONTRAST TECHNIQUE: Multidetector CT imaging  of the chest was performed using the standard protocol during bolus administration of intravenous contrast. Multiplanar CT image reconstructions and MIPs were obtained to evaluate the vascular anatomy. CONTRAST:  100 mL of Isovue 370. COMPARISON:  No priors. FINDINGS: Creatinine was obtained on site at [facility] at [address] Results: Creatinine 0.7 mg/dL. Mediastinum/Lymph Nodes: Heart size is normal. There is no significant pericardial fluid, thickening or pericardial calcification. There is atherosclerosis of the thoracic aorta, the great vessels of the mediastinum and the coronary arteries, including calcified atherosclerotic plaque  in the left circumflex coronary artery. Severe thickening and calcification of the aortic valve. Mild aneurysmal dilatation of the ascending thoracic aorta which measures up to 4.6 cm in diameter. The arch is normal in caliber measuring 2.9 cm in diameter just distal to the origin of the left subclavian artery. Descending thoracic aorta is also normal in caliber measuring 2.4 cm in diameter. No evidence of thoracic aortic dissection. Normal three-vessel right-sided aortic arch. No pathologically enlarged mediastinal or hilar lymph nodes. Esophagus is unremarkable in appearance. No axillary lymphadenopathy. Lungs/Pleura: No suspicious appearing pulmonary nodules or masses. No acute consolidative airspace disease. No pleural effusions. No pneumothorax. Upper Abdomen: Diffuse low attenuation throughout the hepatic parenchyma, compatible with severe hepatic steatosis. Intermediate to high attenuation material layering dependently in the gallbladder, compatible with biliary sludge and/or noncalcified gallstones. No current findings to suggest an acute cholecystitis at this time. Musculoskeletal/Soft Tissues: There are no aggressive appearing lytic or blastic lesions noted in the visualized portions of the skeleton. Review of the MIP images confirms the above findings.  IMPRESSION: 1. Severely thickened and heavily calcified aortic valve, compatible with the reported clinical history of aortic stenosis. This is associated with mild aneurysmal dilatation of the ascending thoracic aorta which currently measures 4.6 cm in diameter. 2. Normal right sided 3 vessel aortic arch. 3. Atherosclerosis, including left circumflex coronary artery disease. Please note that although the presence of coronary artery calcium documents the presence of coronary artery disease, the severity of this disease and any potential stenosis cannot be assessed on this non-gated CT examination. Assessment for potential risk factor modification, dietary therapy or pharmacologic therapy may be warranted, if clinically indicated. 4. Severe hepatic steatosis. 5. Biliary sludge and/or noncalcified gallstones lying dependently in the gallbladder. No findings to suggest an acute cholecystitis at this time. Electronically Signed   By: Trudie Reed M.D.   On: 09/24/2015 14:47   Dg Chest Port 1 View  10/11/2015  CLINICAL DATA:  Status post aortic valve replacement EXAM: PORTABLE CHEST 1 VIEW COMPARISON:  10/10/2015 FINDINGS: Cardiac shadow is mildly enlarged but stable. There is been interval removal of the Swan-Ganz catheter and mediastinal drains. Bibasilar atelectatic changes are noted and slightly increased from the prior exam. No pneumothorax is same. A right jugular sheath remains in place. IMPRESSION: Interval removal of tubes and lines as described. Slight increase in bibasilar atelectatic changes. Electronically Signed   By: Alcide Clever M.D.   On: 10/11/2015 07:34   Dg Chest Port 1 View  10/10/2015  CLINICAL DATA:  Status post CABG on 26 January EXAM: PORTABLE CHEST 1 VIEW COMPARISON:  Portable chest x-ray of 09 October 2015. FINDINGS: The trachea and esophagus have been extubated. The lungs are mildly hypoinflated. There is mild bibasilar subsegmental atelectasis. There is no significant pleural  effusion and there is no pneumothorax. The heart is mildly enlarged. A prosthetic aortic valve ring is visible. The pulmonary vascularity is not engorged. The pulmonary interstitial edema has resolved. The Swan-Ganz catheter tip projects in the proximal right main pulmonary artery. A mediastinal drain and a left lower medial chest tube remain. All 7 sternal wires are intact. IMPRESSION: Interval resolution of interstitial edema. Persistent mild bibasilar subsegmental atelectasis. No significant pleural effusion. The remaining support tubes are in reasonable position. Electronically Signed   By: David  [place] M.D.   On: 10/10/2015 07:53   Dg Chest Port 1 View  10/09/2015  CLINICAL DATA:  Immediate postop Bentall procedure (aortic valve replacement) using a Development worker, international aid  valve. EXAM: PORTABLE CHEST 1 VIEW COMPARISON:  10/07/2015 and earlier. FINDINGS: Endotracheal tube tip in satisfactory position projecting approximately 4 cm above the carina. Right jugular Swan-Ganz catheter tip projects at the proximal right main pulmonary artery. Nasogastric tube courses below the diaphragm into the stomach. Mediastinal drainage tube. Sternotomy for aortic valve replacement. Suboptimal inspiration with dense left lower lobe atelectasis and moderate atelectasis at the right lung base. Linear atelectasis in the right upper lobe and lingula. Pulmonary vascularity normal without evidence pulmonary edema. Small bilateral pleural effusions suspected. IMPRESSION: 1. Support apparatus satisfactory. 2. Suboptimal inspiration. Dense left lower lobe atelectasis. Moderate atelectasis at the right lung base and linear atelectasis in the right upper lobe and lingula. 3. Probable small bilateral pleural effusions. Electronically Signed   By: Hulan Saas M.D.   On: 10/09/2015 14:46    Microbiology: Recent Results (from the past 240 hour(s))  Culture, blood (routine x 2)     Status: None   Collection Time: 10/20/15 12:30 PM   Result Value Ref Range Status   Specimen Description BLOOD RIGHT ANTECUBITAL  Final   Special Requests   Final    BOTTLES DRAWN AEROBIC AND ANAEROBIC 5CC AER 8CC ANA   Culture  Setup Time   Final    GRAM POSITIVE COCCI IN CLUSTERS AEROBIC BOTTLE ONLY CRITICAL RESULT CALLED TO, READ BACK BY AND VERIFIED WITH: Cathe Mons ON 6E @0103  10/22/15 MKELLY    Culture   Final    STAPHYLOCOCCUS SPECIES (COAGULASE NEGATIVE) THE SIGNIFICANCE OF ISOLATING THIS ORGANISM FROM A SINGLE SET OF BLOOD CULTURES WHEN MULTIPLE SETS ARE DRAWN IS UNCERTAIN. PLEASE NOTIFY THE MICROBIOLOGY DEPARTMENT WITHIN ONE WEEK IF SPECIATION AND SENSITIVITIES ARE REQUIRED.    Report Status 10/23/2015 FINAL  Final  Urine culture     Status: None   Collection Time: 10/20/15  1:19 PM  Result Value Ref Range Status   Specimen Description URINE, CLEAN CATCH  Final   Special Requests NONE  Final   Culture 9,000 COLONIES/mL INSIGNIFICANT GROWTH  Final   Report Status 10/21/2015 FINAL  Final  Culture, blood (routine x 2)     Status: None (Preliminary result)   Collection Time: 10/20/15  1:50 PM  Result Value Ref Range Status   Specimen Description BLOOD RIGHT WRIST  Final   Special Requests BOTTLES DRAWN AEROBIC AND ANAEROBIC 5CC  Final   Culture NO GROWTH 2 DAYS  Final   Report Status PENDING  Incomplete  Respiratory virus panel     Status: None   Collection Time: 10/21/15  7:05 PM  Result Value Ref Range Status   Respiratory Syncytial Virus A Negative Negative Final   Respiratory Syncytial Virus B Negative Negative Final   Influenza A Negative Negative Final   Influenza B Negative Negative Final   Parainfluenza 1 Negative Negative Final   Parainfluenza 2 Negative Negative Final   Parainfluenza 3 Negative Negative Final   Metapneumovirus Negative Negative Final   Rhinovirus Negative Negative Final   Adenovirus Negative Negative Final    Comment: (NOTE) Performed At: [facility] [address] [place], [place]  [id number] Mila Homer MD WG:[id number]      Labs: Basic Metabolic Panel:  Recent Labs Lab 10/20/15 1242 10/21/15 0701 10/22/15 0636  NA 134* 134* 138  K 4.6 3.4* 3.8  CL 100* 104 105  CO2 19* 21* 24  GLUCOSE 172* 149* 159*  BUN 10 8 8   CREATININE 0.93 0.81 0.91  CALCIUM 9.8 8.3* 8.9   Liver  Function Tests:  Recent Labs Lab 10/20/15 1242 10/22/15 0636  AST 54* 27  ALT 37 59  ALKPHOS 109 65  BILITOT 1.2 0.7  PROT 7.1 5.8*  ALBUMIN 3.8 3.0*   No results for input(s): LIPASE, AMYLASE in the last 168 hours. No results for input(s): AMMONIA in the last 168 hours. CBC:  Recent Labs Lab 10/20/15 1242 10/21/15 0701 10/22/15 0636  WBC 17.3* 7.5 5.7  NEUTROABS 16.2*  --   --   HGB 12.2* 8.7* 9.6*  HCT 35.3* 24.7* 27.7*  MCV 84.7 84.6 85.2  PLT 533* 329 365   Cardiac Enzymes: No results for input(s): CKTOTAL, CKMB, CKMBINDEX, TROPONINI in the last 168 hours. BNP: BNP (last 3 results) No results for input(s): BNP in the last 8760 hours.  ProBNP (last 3 results) No results for input(s): PROBNP in the last 8760 hours.  CBG:  Recent Labs Lab 10/22/15 1146 10/22/15 1633 10/22/15 2122 10/23/15 0741 10/23/15 1146  GLUCAP 156* 148* 188* 156* 147*       Signed:  Marcellus Scott, MD, FACP, FHM. [facility] Pager [phone]  If 7PM-7AM, please contact night-coverage [email or web address] Password [facility] 10/23/2015, 12:23 PM

## 2015-10-23 NOTE — Discharge Instructions (Signed)
Fever, Adult A fever is an increase in the body's temperature. It is usually defined as a temperature of 100F (38C) or higher. Brief mild or moderate fevers generally have no long-term effects, and they often do not require treatment. Moderate or high fevers may make you feel uncomfortable and can sometimes be a sign of a serious illness or disease. The sweating that may occur with repeated or prolonged fever may also cause dehydration. Fever is confirmed by taking a temperature with a thermometer. A measured temperature can vary with:  Age.  Time of day.  Location of the thermometer:  Mouth (oral).  Rectum (rectal).  Ear (tympanic).  Underarm (axillary).  Forehead (temporal). HOME CARE INSTRUCTIONS Pay attention to any changes in your symptoms. Take these actions to help with your condition:  Take over-the counter and prescription medicines only as told by your health care provider. Follow the dosing instructions carefully.  If you were prescribed an antibiotic medicine, take it as told by your health care provider. Do not stop taking the antibiotic even if you start to feel better.  Rest as needed.  Drink enough fluid to keep your urine clear or pale yellow. This helps to prevent dehydration.  Sponge yourself or bathe with room-temperature water to help reduce your body temperature as needed. Do not use ice water.  Do not overbundle yourself in blankets or heavy clothes. SEEK MEDICAL CARE IF:  You vomit.  You cannot eat or drink without vomiting.  You have diarrhea.  You have pain when you urinate.  Your symptoms do not improve with treatment.  You develop new symptoms.  You develop excessive weakness. SEEK IMMEDIATE MEDICAL CARE IF:  You have shortness of breath or have trouble breathing.  You are dizzy or you faint.  You are disoriented or confused.  You develop signs of dehydration, such as a dry mouth, decreased urination, or paleness.  You develop  severe pain in your abdomen.  You have persistent vomiting or diarrhea.  You develop a skin rash.  Your symptoms suddenly get worse.   This information is not intended to replace advice given to you by your health care provider. Make sure you discuss any questions you have with your health care provider.   Document Released: 02/23/2001 Document Revised: 05/21/2015 Document Reviewed: 10/24/2014 Elsevier Interactive Patient Education 2016 [employer]  

## 2015-10-23 NOTE — Progress Notes (Signed)
Discharge instructions and medications discussed with patient.  All questions answered.  

## 2015-10-23 NOTE — Consult Note (Signed)
[facility]  Total days of antibiotics 4        Day 4 vanco        Day 4 piptazo               Reason for Consult: febrile illness    Referring Physician: [email or web address]  Active Problems:   Diabetes type 2, uncontrolled ([place])   Aortic stenosis due to bicuspid aortic valve   Fever   Leukocytosis   Type 2 diabetes mellitus with hyperglycemia (HCC)   Sepsis (HCC)   Pyrexia    HPI: [name] is a 55 y.o. male with PMHX of diabetes mellitus type 2, hypertension hyperlipidemia,CAD s/p inferior STEMI and PCI/DES to a 99% OM2 09/2014, hx of bicuspid aortic valve stenosis underwent AVR with st. Jude mechanical valve on 10/09/2015. He presented to the ED on 2/6 with one-day history of fever up to 103.57F at home, roughly 3 days since being discharged.  In the Ed, he had fever of 103F, hypotension with systolic blood pressure in the 80s, WBC 17.3, lactic acid 2.41. The patient was started on vancomycin and Zosyn empircally to treat sepsis after blood cultures were obtained. Patient denies headache, chest pain, dyspnea, nausea, vomiting, diarrhea, abdominal pain, dysuria, hematuria, rashes, synovitis. Infectious work up ruled out urinary source, ua and urine cx are negative. cxr negative. Flu ruled out. RVP negative. His blood cx did grow 1 of 4 bottles GPCC, identified as CoNS. Patient's leukocytosis quickly recovered the following day. No further fevers after initial day.   Past Medical History  Diagnosis Date  . Hyperlipidemia   . Bell's palsy   . Aortic stenosis due to bicuspid aortic valve     Probable bicuspid aortic valve: a. mod-sev by 2D ECHO 09/2014. AVA 0.9cm^2; b) 10/2014 Echo:. Severe AS Mn-Pk Gradient 41-71 mmHg, AVA ~0.79 cm2, EF 60-65%  . Hypertriglyceridemia   . CAD S/P percutaneous coronary angioplasty 09/20/2014    a. inferolat STEMI ->> LHC-Angio: Proximal/ostial LAD 20%, OM2 99% -->> PCI: 56m x 16 mm Promus Premier DES to the OM2.(~3.5 mm)  . [place]  [place]  . Hypertension   . Heart murmur   . Shortness of breath dyspnea   . Diabetes mellitus type 2, controlled, with complications ([place]     Allergies: No Known Allergies  MEDICATIONS: . aspirin EC  81 mg Oral q morning - 10a  . atorvastatin  40 mg Oral QHS  . fenofibrate  160 mg Oral q morning - 10a  . insulin aspart  0-15 Units Subcutaneous TID WC  . insulin aspart  0-5 Units Subcutaneous QHS  . piperacillin-tazobactam  3.375 g Intravenous 3 times per day  . sodium chloride flush  3 mL Intravenous Q12H  . vancomycin  1,000 mg Intravenous Q8H  . Warfarin - Pharmacist Dosing Inpatient   Does not apply q1800    Social History  Substance Use Topics  . Smoking status: Never Smoker   . Smokeless tobacco: None  . Alcohol Use: No    Family History  Problem Relation Age of Onset  . CAD Neg Hx     per wife   Review of Systems  Did not see patient    OBJECTIVE: Temp:  [97.8 F (36.6 C)-98.6 F (37 C)] 97.8 F (36.6 C) (02/09 0743) Pulse Rate:  [79-86] 83 (02/09 0743) Resp:  [17-20] 18 (02/09 0743) BP: (107-128)/(74-84) 107/75 mmHg (02/09 0743) SpO2:  [97 %-  99 %] 98 % (02/09 0743) Patient discharged prior to examining him.  LABS: Results for orders placed or performed during the hospital encounter of 10/20/15 (from the past 48 hour(s))  Glucose, capillary     Status: Abnormal   Collection Time: 10/21/15 11:22 AM  Result Value Ref Range   Glucose-Capillary 159 (H) 65 - 99 mg/dL   Comment 1 Notify RN    Comment 2 Document in Chart   Glucose, capillary     Status: Abnormal   Collection Time: 10/21/15  3:27 PM  Result Value Ref Range   Glucose-Capillary 206 (H) 65 - 99 mg/dL  Respiratory virus panel     Status: None   Collection Time: 10/21/15  7:05 PM  Result Value Ref Range   Respiratory Syncytial Virus A Negative Negative   Respiratory Syncytial Virus B Negative Negative   Influenza A Negative  Negative   Influenza B Negative Negative   Parainfluenza 1 Negative Negative   Parainfluenza 2 Negative Negative   Parainfluenza 3 Negative Negative   Metapneumovirus Negative Negative   Rhinovirus Negative Negative   Adenovirus Negative Negative    Comment: (NOTE) Performed At: [facility] [place], [place] [id number] Lindon Romp MD JX:[id number]   Glucose, capillary     Status: Abnormal   Collection Time: 10/21/15  9:41 PM  Result Value Ref Range   Glucose-Capillary 109 (H) 65 - 99 mg/dL  Protime-INR     Status: Abnormal   Collection Time: 10/22/15  6:36 AM  Result Value Ref Range   Prothrombin Time 25.1 (H) 11.6 - 15.2 seconds   INR 2.30 (H) 0.00 - 1.49  Comprehensive metabolic panel     Status: Abnormal   Collection Time: 10/22/15  6:36 AM  Result Value Ref Range   Sodium 138 135 - 145 mmol/L   Potassium 3.8 3.5 - 5.1 mmol/L   Chloride 105 101 - 111 mmol/L   CO2 24 22 - 32 mmol/L   Glucose, Bld 159 (H) 65 - 99 mg/dL   BUN 8 6 - 20 mg/dL   Creatinine, Ser 0.91 0.61 - 1.24 mg/dL   Calcium 8.9 8.9 - 10.3 mg/dL   Total Protein 5.8 (L) 6.5 - 8.1 g/dL   Albumin 3.0 (L) 3.5 - 5.0 g/dL   AST 27 15 - 41 U/L   ALT 59 17 - 63 U/L   Alkaline Phosphatase 65 38 - 126 U/L   Total Bilirubin 0.7 0.3 - 1.2 mg/dL   GFR calc non Af Amer >60 >60 mL/min   GFR calc Af Amer >60 >60 mL/min    Comment: (NOTE) The eGFR has been calculated using the CKD EPI equation. This calculation has not been validated in all clinical situations. eGFR's persistently <60 mL/min signify possible Chronic Kidney Disease.    Anion gap 9 5 - 15  CBC     Status: Abnormal   Collection Time: 10/22/15  6:36 AM  Result Value Ref Range   WBC 5.7 4.0 - 10.5 K/uL   RBC 3.25 (L) 4.22 - 5.81 MIL/uL   Hemoglobin 9.6 (L) 13.0 - 17.0 g/dL   HCT 27.7 (L) 39.0 - 52.0 %   MCV 85.2 78.0 - 100.0 fL   MCH 29.5 26.0 - 34.0 pg   MCHC 34.7 30.0 - 36.0 g/dL   RDW 12.7 11.5 - 15.5 %   Platelets  365 150 - 400 K/uL  Glucose, capillary     Status: Abnormal   Collection Time: 10/22/15  7:56 AM  Result Value Ref Range   Glucose-Capillary 131 (H) 65 - 99 mg/dL   Comment 1 Document in Chart   Glucose, capillary     Status: Abnormal   Collection Time: 10/22/15 11:46 AM  Result Value Ref Range   Glucose-Capillary 156 (H) 65 - 99 mg/dL   Comment 1 Document in Chart   Glucose, capillary     Status: Abnormal   Collection Time: 10/22/15  4:33 PM  Result Value Ref Range   Glucose-Capillary 148 (H) 65 - 99 mg/dL   Comment 1 Document in Chart   Glucose, capillary     Status: Abnormal   Collection Time: 10/22/15  9:22 PM  Result Value Ref Range   Glucose-Capillary 188 (H) 65 - 99 mg/dL   Comment 1 Notify RN    Comment 2 Document in Chart   Glucose, capillary     Status: Abnormal   Collection Time: 10/23/15  7:41 AM  Result Value Ref Range   Glucose-Capillary 156 (H) 65 - 99 mg/dL  Protime-INR     Status: Abnormal   Collection Time: 10/23/15  7:55 AM  Result Value Ref Range   Prothrombin Time 26.3 (H) 11.6 - 15.2 seconds   INR 2.46 (H) 0.00 - 1.49    MICRO: 2/6 blood cx 1 of 4 bottle Cons IMAGING: cxr did not show any infiltrate per my read  Assessment/Plan:  55yo M who was POD#11 when presenting with high fever, possible nasal congestion, leukocytosis which resolved. Infectious work up negative except for 1 of 4 bottles positive for CONS - possibly could be contaminant though he is symptomatic from presumed viral infection. He was started on abtx immediately so further blood cx are likely to be NGTD. -  Spoke to Dr. Cyndia Bent and felt that TEE to evaluate his new valve and rule out vegetations would not be useful so soon after his valve surgery -  We will plan to do surveillance blood cx off of antibiotics to see if need to commit to IV abtx for bacteremia or other invasive disease - recommend to keep temperature diary - will coordinate repeat blood cx next week

## 2015-10-24 SURGERY — ECHOCARDIOGRAM, TRANSESOPHAGEAL
Anesthesia: Moderate Sedation

## 2015-10-25 LAB — CULTURE, BLOOD (ROUTINE X 2): Culture: NO GROWTH

## 2015-10-27 ENCOUNTER — Ambulatory Visit (INDEPENDENT_AMBULATORY_CARE_PROVIDER_SITE_OTHER): Payer: BC Managed Care – PPO | Admitting: Pharmacist Clinician (PhC)/ Clinical Pharmacy Specialist

## 2015-10-27 ENCOUNTER — Ambulatory Visit (INDEPENDENT_AMBULATORY_CARE_PROVIDER_SITE_OTHER): Payer: BC Managed Care – PPO | Admitting: Physician Assistant

## 2015-10-27 ENCOUNTER — Other Ambulatory Visit: Payer: BC Managed Care – PPO

## 2015-10-27 ENCOUNTER — Encounter: Payer: Self-pay | Admitting: Physician Assistant

## 2015-10-27 VITALS — BP 100/70 | HR 80 | Ht 67.0 in | Wt 183.0 lb

## 2015-10-27 DIAGNOSIS — I2510 Atherosclerotic heart disease of native coronary artery without angina pectoris: Secondary | ICD-10-CM

## 2015-10-27 DIAGNOSIS — Z7901 Long term (current) use of anticoagulants: Secondary | ICD-10-CM

## 2015-10-27 DIAGNOSIS — Z954 Presence of other heart-valve replacement: Secondary | ICD-10-CM

## 2015-10-27 DIAGNOSIS — Z952 Presence of prosthetic heart valve: Secondary | ICD-10-CM

## 2015-10-27 DIAGNOSIS — R509 Fever, unspecified: Secondary | ICD-10-CM

## 2015-10-27 LAB — POCT INR: INR: 3.5

## 2015-10-27 NOTE — Addendum Note (Signed)
Addended by: Darrol Jump on: 10/27/2015 06:14 PM   Modules accepted: Level of Service

## 2015-10-27 NOTE — Progress Notes (Signed)
Cardiology Office Note   Date:  10/27/2015   ID:  [facility], DOB [birth year], MRN [id number]  PCP:  Leanor Rubenstein, MD  Cardiologist:  Dr Lauraine Rinne, PA-C   Chief Complaint  Patient presents with  . Follow-up    no chest pain, no swelling, no shortness of breath, no cramping, no dizziness or lightheadedness    History of Present Illness: [facility] Bates is a 55 y.o. male with a history of bicuspid aortic valve and CAD with drug-eluting stent to the OM 2 and 2016 for single vessel disease, hypertension and diabetes  D/C 10/17/2015 after mech AoVR  D/c 10/23/2015 after admit for fever, symptoms improved and he was discharged after 3 days of antibiotics.  [facility] presents for post hospital follow-up.  D[facility] Bates had follow-up blood cultures drawn today. He isn't his wife are greatly hoping they will be negative.  Since discharge from the hospital the second time, he has done very well. He is weak but is gradually improving his strength and endurance. He is walking and trying to increase his distance. He can hear his heartbeat all the time, and wonders if that is normal. He has not having chest pain, shortness of breath, lower extremity edema, orthopnea or PND. His incisions are healing well. He has not had any more fevers.   Past Medical History  Diagnosis Date  . Hyperlipidemia   . Bell's palsy   . Aortic stenosis due to bicuspid aortic valve     Probable bicuspid aortic valve: a. mod-sev by 2D ECHO 09/2014. AVA 0.9cm^2; b) 10/2014 Echo:. Severe AS Mn-Pk Gradient 41-71 mmHg, AVA ~0.79 cm2, EF 60-65%  . Hypertriglyceridemia   . CAD S/P percutaneous coronary angioplasty 09/20/2014    a. inferolat STEMI ->> LHC-Angio: Proximal/ostial LAD 20%, OM2 99% -->> PCI: 3mm x 16 mm Promus Premier DES to the OM2.(~3.5 mm)  . ST elevation myocardial infarction (STEMI) of inferior wall (HCC) 09/20/2014  . Hypertension   . Heart murmur   . Shortness of breath dyspnea   .  Diabetes mellitus type 2, controlled, with complications [facility])     Past Surgical History  Procedure Laterality Date  . Cardiac catheterization      remote h/o cath >15 yrs ago, unkown  . Doppler echocardiography  January 2016, February2016    Probable bicuspid aortic valve: a. mod-sev by 2D ECHO 09/2014. AVA 0.9cm^2; b) 10/2014 Echo:. Severe AS Mn-Pk Gradient 41-71 mmHg, AVA ~0.79 cm2, EF 60-65%  . Left heart catheterization with coronary angiogram N/A 09/20/2014    Procedure: LEFT HEART CATHETERIZATION WITH CORONARY ANGIOGRAM;  Surgeon: Marykay Lex, MD;  Location: [facility] C[facility] LAB;  Service: Cardiovascular;  Laterality: N/A;  . Percutaneous coronary stent intervention (pci-s)  09/20/2014    PTCI to OM 2 with Promus Premier DES 3.0 mm x 16 mm (3.5 mm)  . Knee arthroscopy w/ acl reconstruction Right     90's  . Bentall procedure N/A 10/09/2015    Procedure: BENTALL PROCEDURE (using a St Jude mechanical valve, size 23);  Surgeon: Alleen Borne, MD;  Location: [facility] OR;  Service: Open Heart Surgery;  Laterality: N/A;  CIRC ARREST  NEEDS RIGHT RADIAL A-LINE  . Tee without cardioversion N/A 10/09/2015    Procedure: TRANSESOPHAGEAL ECHOCARDIOGRAM (TEE);  Surgeon: Alleen Borne, MD;  Location: [facility] OR;  Service: Open Heart Surgery;  Laterality: N/A;    Current Outpatient Prescriptions  Medication Sig Dispense Refill  . acetaminophen (TYLENOL) 325 MG tablet  Take 2 tablets (650 mg total) by mouth every 6 (six) hours as needed for mild pain.    Marland Kitchen aspirin EC 81 MG EC tablet Take 1 tablet (81 mg total) by mouth daily. (Patient taking differently: Take 81 mg by mouth every morning. )    . atorvastatin (LIPITOR) 80 MG tablet Take 0.5 tablets (40 mg total) by mouth at bedtime.    . carvedilol (COREG) 3.125 MG tablet Take 1 tablet (3.125 mg total) by mouth 2 (two) times daily with a meal. 60 tablet 11  . fenofibrate 160 MG tablet Take 1 tablet (160 mg total) by mouth daily. (Patient taking differently: Take 160 mg  by mouth every morning. ) 30 tablet 11  . glipiZIDE (GLUCOTROL XL) 5 MG 24 hr tablet Take 1 tablet (5 mg total) by mouth daily with breakfast. 30 tablet 11  . metFORMIN (GLUCOPHAGE) 1000 MG tablet Take 1,000 mg by mouth 2 (two) times daily with a meal.     . oxyCODONE (OXY IR/ROXICODONE) 5 MG immediate release tablet Take 1-2 tablets (5-10 mg total) by mouth every 3 (three) hours as needed for severe pain. 30 tablet 0  . warfarin (COUMADIN) 10 MG tablet Take 1 tablet (10 mg total) by mouth daily at 6 PM. Or as directed 30 tablet 1   No current facility-administered medications for this visit.    Allergies:   Review of patient's allergies indicates no known allergies.    Social History:  The patient  reports that he has never smoked. He does not have any smokeless tobacco history on file. He reports that he does not drink alcohol or use illicit drugs.   Family History:  The patient's family history is negative for CAD.    ROS:  Please see the history of present illness. All other systems are reviewed and negative.    PHYSICAL EXAM: VS:  BP 100/70 mmHg  Pulse 80  Ht  (1.702 m)  Wt 183 lb (83.008 kg)  BMI 28.66 kg/m2 , BMI Body mass index is 28.66 kg/(m^2). GEN: Well nourished, well developed, male in no acute distress HEENT: normal for age  Neck: no JVD, no carotid bruit, no masses Cardiac: RRR; crisp valve click and soft murmur, no rubs, or gallops Respiratory:  clear to auscultation bilaterally, normal work of breathing GI: soft, nontender, nondistended, + BS MS: no deformity or atrophy; no edema; distal pulses are 2+ in all 4 extremities  Skin: warm and dry, no rash; all incisions are healing well with no signs of infection Neuro:  Strength and sensation are intact Psych: euthymic mood, full affect   EKG:  EKG is not ordered today.  Recent Labs: 10/10/2015: Magnesium 2.2 10/22/2015: ALT 59; BUN 8; Creatinine, Ser 0.91; Hemoglobin 9.6*; Platelets 365; Potassium 3.8; Sodium  138    Lipid Panel    Component Value Date/Time   CHOL 107 11/05/2014 0841   TRIG 182.0* 11/05/2014 0841   HDL 28.20* 11/05/2014 0841   CHOLHDL 4 11/05/2014 0841   VLDL 36.4 11/05/2014 0841   LDLCALC 42 11/05/2014 0841   LDLDIRECT 43 09/21/2014 1108     Wt Readings from Last 3 Encounters:  10/27/15 183 lb (83.008 kg)  10/20/15 183 lb 6.8 oz (83.2 kg)  10/17/15 180 lb 11.2 oz (81.965 kg)     Other studies Reviewed: Additional studies/ records that were reviewed today include: Hospital records and testing.  ASSESSMENT AND PLAN:  1.  S/p AVR: He has a crisp valve click and  a minimal murmur. He has no volume overload by symptoms or exam. He is encouraged to continue to increase his activity. His activity is to be increased gradually, starting with walking. Following up with cardiac rehabilitation is encouraged.   2. CAD: His blood pressure is too low to increase his carvedilol. He is not on an ACE inhibitor because his blood pressures not high enough. Continue aspirin, beta blocker and statin.   3. Chronic anticoagulation: He is tolerating the Coumadin well. Baxter Hire was in to do a Coumadin check and manage his Coumadin.   Current medicines are reviewed at length with the patient today.  The patient does not have concerns regarding medicines.  The following changes have been made:  no change  Labs/ tests ordered today include:  No orders of the defined types were placed in this encounter.     Disposition:  Follow up with Dr. [name]  Signed, Leanna Battles  10/27/2015 2:37 PM    [facility] [address] [place], [place], [place]  [address] Phone: [phone]; Fax: [phone]

## 2015-10-27 NOTE — Patient Instructions (Signed)
Your physician recommends that you schedule a follow-up appointment in: 3 months with Dr [name]. No changes have been made today in your therapy.

## 2015-11-02 LAB — CULTURE, BLOOD (SINGLE)
Organism ID, Bacteria: NO GROWTH
Organism ID, Bacteria: NO GROWTH

## 2015-11-03 ENCOUNTER — Ambulatory Visit (INDEPENDENT_AMBULATORY_CARE_PROVIDER_SITE_OTHER): Payer: Self-pay | Admitting: Surgery

## 2015-11-03 ENCOUNTER — Ambulatory Visit (INDEPENDENT_AMBULATORY_CARE_PROVIDER_SITE_OTHER): Payer: BC Managed Care – PPO | Admitting: Pharmacist Clinician (PhC)/ Clinical Pharmacy Specialist

## 2015-11-03 ENCOUNTER — Encounter: Payer: Self-pay | Admitting: Surgery

## 2015-11-03 VITALS — BP 115/79 | HR 92 | Resp 16 | Ht 67.0 in | Wt 178.0 lb

## 2015-11-03 DIAGNOSIS — Z952 Presence of prosthetic heart valve: Secondary | ICD-10-CM

## 2015-11-03 DIAGNOSIS — Z7901 Long term (current) use of anticoagulants: Secondary | ICD-10-CM | POA: Diagnosis not present

## 2015-11-03 DIAGNOSIS — I350 Nonrheumatic aortic (valve) stenosis: Secondary | ICD-10-CM

## 2015-11-03 DIAGNOSIS — Q231 Congenital insufficiency of aortic valve: Secondary | ICD-10-CM

## 2015-11-03 DIAGNOSIS — Z954 Presence of other heart-valve replacement: Secondary | ICD-10-CM | POA: Diagnosis not present

## 2015-11-03 LAB — POCT INR: INR: 2.5

## 2015-11-03 NOTE — Progress Notes (Signed)
HPI: Patient returns for routine postoperative follow-up having undergone Bentall procedure and replacement  Of an ascending aortic aneurysm using a St. Jude mechanical valved graft on 10/09/2015. The patient's early postoperative recovery while in the hospital was notable for an uncomplicated postop course. He was in the hospital a little longer than usual due to taking more time to get him into the therapeutic range on Coumadin. He was then readmitted a few days after discharge with fever to 103, leukocytosis, tachycardia and an elevated lactate level. He had blood cultures drawn and was started on broad spectrum antibiotics and IV fluid with immediate resolution of his fever and resolution of his leukocytosis by the next day. One out of four blood cultures grew coag negative staph and I thought that this was likely a contaminate. He was negative for Influenza A, B and H1N1. Respiratory virus panel was negative. He felt well and after consulting with ID we decided to stop his antibiotics let him go home with follow up in a few days.  Since hospital discharge the patient returned to see Dr. Drue Second last Monday and had blood cultures done which were negative. He says that he has had no fever or chills and feels well. He has been walking for 20 minutes and get some shortness of breath by the time he is done but it resolves quickly with rest. His wife is currently ill with a URI and is with him today.    Current Outpatient Prescriptions  Medication Sig Dispense Refill  . acetaminophen (TYLENOL) 325 MG tablet Take 2 tablets (650 mg total) by mouth every 6 (six) hours as needed for mild pain.    Marland Kitchen aspirin EC 81 MG EC tablet Take 1 tablet (81 mg total) by mouth daily. (Patient taking differently: Take 81 mg by mouth every morning. )    . atorvastatin (LIPITOR) 80 MG tablet Take 0.5 tablets (40 mg total) by mouth at bedtime.    . carvedilol (COREG) 3.125 MG tablet Take 1 tablet (3.125 mg total) by mouth  2 (two) times daily with a meal. 60 tablet 11  . fenofibrate 160 MG tablet Take 1 tablet (160 mg total) by mouth daily. (Patient taking differently: Take 160 mg by mouth every morning. ) 30 tablet 11  . glipiZIDE (GLUCOTROL XL) 5 MG 24 hr tablet Take 1 tablet (5 mg total) by mouth daily with breakfast. 30 tablet 11  . metFORMIN (GLUCOPHAGE) 1000 MG tablet Take 1,000 mg by mouth 2 (two) times daily with a meal.     . oxyCODONE (OXY IR/ROXICODONE) 5 MG immediate release tablet Take 1-2 tablets (5-10 mg total) by mouth every 3 (three) hours as needed for severe pain. 30 tablet 0  . warfarin (COUMADIN) 10 MG tablet Take 1 tablet (10 mg total) by mouth daily at 6 PM. Or as directed 30 tablet 1   No current facility-administered medications for this visit.    Physical Exam: BP 115/79 mmHg  Pulse 92  Resp 16  Ht  (1.702 m)  Wt 178 lb (80.74 kg)  BMI 27.87 kg/m2  SpO2 98% He looks well. Lung exam is clear. Cardiac exam shows a regular rate and rhythm with normal mechanical heart sounds. Chest incision is healing well and sternum is stable. There is no peripheral edema.  Diagnostic Tests:  INR was 2.5 today. His current Coumadin dose is 10 mg daily except Monday and Friday when he takes 5 mg.   Impression:  Overall  I think he is doing well. I have to suspect that his fever and leukocytosis were due to a virus. I told him that he can return to driving and can start cardiac rehab. I asked him not to lift anything heavier than 10 lbs for 3 months postop.   Plan:  I will see him back next week with a CXR for his regularly scheduled postop visit. He will continue to follow up with Dr. [name] as scheduled and his INR will be followed in the anticoagulation clinic.   Alleen Borne, MD [facility] [phone]

## 2015-11-05 ENCOUNTER — Telehealth: Payer: Self-pay | Admitting: Cardiology

## 2015-11-05 NOTE — Telephone Encounter (Signed)
Spoke to patient  Informed patient , Cardiac rehab order have been signed and faxed to [facility] rehab PATIENT VERBALIZED UNDERSTANDING.

## 2015-11-05 NOTE — Telephone Encounter (Signed)
New message       Pt had valve surgery recently.  He said Dr Laneta Simmers said he can go to cardiac rehab.  He want to know if Dr [name] will refer him to [place] cardiac rehab?

## 2015-11-06 ENCOUNTER — Telehealth (HOSPITAL_COMMUNITY): Payer: Self-pay | Admitting: *Deleted

## 2015-11-06 NOTE — Telephone Encounter (Signed)
Received signed MD order. Pt has upcoming appt with surgeon on 3/1. Message left for pt to please return call. Contact information provided. Alanson Aly, BSN

## 2015-11-11 ENCOUNTER — Other Ambulatory Visit: Payer: Self-pay | Admitting: Surgery

## 2015-11-11 DIAGNOSIS — Z952 Presence of prosthetic heart valve: Secondary | ICD-10-CM

## 2015-11-12 ENCOUNTER — Ambulatory Visit
Admission: RE | Admit: 2015-11-12 | Discharge: 2015-11-12 | Disposition: A | Discharge status: Home / Self Care | Payer: BC Managed Care – PPO | Source: Ambulatory Visit | Attending: Surgery | Admitting: Surgery

## 2015-11-12 ENCOUNTER — Encounter (HOSPITAL_COMMUNITY): Payer: Self-pay | Admitting: *Deleted

## 2015-11-12 ENCOUNTER — Ambulatory Visit (INDEPENDENT_AMBULATORY_CARE_PROVIDER_SITE_OTHER): Payer: Self-pay | Admitting: Surgery

## 2015-11-12 ENCOUNTER — Ambulatory Visit: Payer: BC Managed Care – PPO | Admitting: Surgery

## 2015-11-12 ENCOUNTER — Ambulatory Visit (INDEPENDENT_AMBULATORY_CARE_PROVIDER_SITE_OTHER): Payer: BC Managed Care – PPO | Admitting: Pharmacist Clinician (PhC)/ Clinical Pharmacy Specialist

## 2015-11-12 ENCOUNTER — Encounter: Payer: Self-pay | Admitting: Surgery

## 2015-11-12 VITALS — BP 110/80 | HR 84 | Resp 20 | Ht 67.0 in | Wt 178.0 lb

## 2015-11-12 DIAGNOSIS — Z7901 Long term (current) use of anticoagulants: Secondary | ICD-10-CM | POA: Diagnosis not present

## 2015-11-12 DIAGNOSIS — I350 Nonrheumatic aortic (valve) stenosis: Secondary | ICD-10-CM

## 2015-11-12 DIAGNOSIS — Z954 Presence of other heart-valve replacement: Secondary | ICD-10-CM | POA: Diagnosis not present

## 2015-11-12 DIAGNOSIS — Z952 Presence of prosthetic heart valve: Secondary | ICD-10-CM

## 2015-11-12 DIAGNOSIS — Q231 Congenital insufficiency of aortic valve: Secondary | ICD-10-CM

## 2015-11-12 LAB — POCT INR: INR: 2.5

## 2015-11-12 NOTE — Progress Notes (Signed)
HPI:  Patient returns for routine postoperative follow-up having undergone Bentall procedure and replacement Of an ascending aortic aneurysm using a St. Jude mechanical valved graft on 10/09/2015.  The patient's early postoperative recovery while in the hospital was notable for an uncomplicated postop course. He was in the hospital a little longer than usual due to taking more time to get him into the therapeutic range on Coumadin. He was then readmitted a few days after discharge with fever to 103, leukocytosis, tachycardia and an elevated lactate level. He had blood cultures drawn and was started on broad spectrum antibiotics and IV fluid with immediate resolution of his fever and resolution of his leukocytosis by the next day. One out of four blood cultures grew coag negative staph and I thought that this was likely a contaminate. He was negative for Influenza A, B and H1N1. Respiratory virus panel was negative. He felt well and after consulting with ID we decided to stop his antibiotics let him go home with follow up in a few days.  Since hospital discharge the patient returned to see Dr. Drue Second and had blood cultures done which were negative. I saw him last week and he felt well. He returns today for reevaluation and a CXR. He says that he has had no fever or chills and feels well. He is going for cardiac rehab orientation tomorrow.  Current Outpatient Prescriptions  Medication Sig Dispense Refill  . acetaminophen (TYLENOL) 325 MG tablet Take 2 tablets (650 mg total) by mouth every 6 (six) hours as needed for mild pain.    Marland Kitchen aspirin EC 81 MG EC tablet Take 1 tablet (81 mg total) by mouth daily. (Patient taking differently: Take 81 mg by mouth every morning. )    . atorvastatin (LIPITOR) 80 MG tablet Take 0.5 tablets (40 mg total) by mouth at bedtime.    . carvedilol (COREG) 3.125 MG tablet Take 1 tablet (3.125 mg total) by mouth 2 (two) times daily with a meal. 60 tablet 11  . fenofibrate  160 MG tablet Take 1 tablet (160 mg total) by mouth daily. (Patient taking differently: Take 160 mg by mouth every morning. ) 30 tablet 11  . glipiZIDE (GLUCOTROL XL) 5 MG 24 hr tablet Take 1 tablet (5 mg total) by mouth daily with breakfast. 30 tablet 11  . metFORMIN (GLUCOPHAGE) 1000 MG tablet Take 1,000 mg by mouth 2 (two) times daily with a meal.     . oxyCODONE (OXY IR/ROXICODONE) 5 MG immediate release tablet Take 1-2 tablets (5-10 mg total) by mouth every 3 (three) hours as needed for severe pain. 30 tablet 0  . warfarin (COUMADIN) 10 MG tablet Take 1 tablet (10 mg total) by mouth daily at 6 PM. Or as directed 30 tablet 1   No current facility-administered medications for this visit.     Physical Exam: BP 110/80 mmHg  Pulse 84  Resp 20  Ht  (1.702 m)  Wt 178 lb (80.74 kg)  BMI 27.87 kg/m2  SpO2 98% He looks well. Lung exam is clear. Cardiac exam shows a regular rate and rhythm with normal mechanical heart sounds. Chest incision is healing well and sternum is stable. There is no peripheral edema.  Diagnostic Tests:  CLINICAL DATA: Aortic valve replacement.  EXAM: CHEST 2 VIEW  COMPARISON: 10/20/2015.  FINDINGS: Prior aortic valve replacement. Borderline cardiomegaly with normal pulmonary vascularity. Low lung volumes with mild basilar atelectasis. Small left pleural effusion, improved from prior exam.  IMPRESSION:  1. Aortic valve replacement. Borderline cardiomegaly, no evidence of congestive heart failure.  2. Mild bibasilar subsegmental atelectasis. Small left pleural effusion. Findings have improved from prior exam .   Electronically Signed  By: Maisie Fus Register  On: 11/12/2015 08:46   Impression:  Overall I think he is doing well. I have to suspect that his fever and leukocytosis were due to a virus.  I asked him not to lift anything heavier than 10 lbs for 3 months postop.    Plan:  He will go to cardiac rehab and will follow up  with Dr. [name]. INR will be followed in coumadin clinic.   Alleen Borne, MD [facility] [phone]

## 2015-11-13 ENCOUNTER — Encounter (HOSPITAL_COMMUNITY)
Admission: RE | Admit: 2015-11-13 | Discharge: 2015-11-13 | Disposition: A | Discharge status: Home / Self Care | Payer: BC Managed Care – PPO | Source: Ambulatory Visit | Attending: Cardiology | Admitting: Cardiology

## 2015-11-13 VITALS — BP 118/68 | HR 89 | Ht 67.75 in | Wt 180.8 lb

## 2015-11-13 DIAGNOSIS — Z954 Presence of other heart-valve replacement: Secondary | ICD-10-CM | POA: Insufficient documentation

## 2015-11-13 DIAGNOSIS — Z952 Presence of prosthetic heart valve: Secondary | ICD-10-CM

## 2015-11-13 NOTE — Progress Notes (Signed)
Cardiac Rehab Medication Review by a Pharmacist  Does the patient  feel that his/her medications are working for him/her?  yes  Has the patient been experiencing any side effects to the medications prescribed?  yes  Does the patient measure his/her own blood pressure or blood glucose at home?  yes - once a day in the morning  Does the patient have any problems obtaining medications due to transportation or finances?   no  Understanding of regimen: excellent Understanding of indications: excellent Potential of compliance: excellent  Pharmacist comments: Dennis Bates has great understanding of his medications. He is able to remember his doses and when he takes them every day.   Greggory Stallion, PharmD Clinical Pharmacy Resident Pager # [phone] 11/13/2015 8:25 AM

## 2015-11-13 NOTE — Progress Notes (Signed)
Cardiac Individual Treatment Plan  Patient Details  Name: Dennis Bates MRN: [id number] Date of Birth: 1961-02-07 Referring Provider:  Marykay Lex, MD  Initial Encounter Date:       CARDIAC REHAB PHASE II ORIENTATION from 11/13/2015 in [facility] CARDIAC REHAB   Date  11/13/15      Visit Diagnosis: S/P AVR  Patient's Home Medications on Admission:  Current outpatient prescriptions:  .  acetaminophen (TYLENOL) 325 MG tablet, Take 2 tablets (650 mg total) by mouth every 6 (six) hours as needed for mild pain., Disp: , Rfl:  .  aspirin EC 81 MG EC tablet, Take 1 tablet (81 mg total) by mouth daily. (Patient taking differently: Take 81 mg by mouth every morning. ), Disp: , Rfl:  .  atorvastatin (LIPITOR) 80 MG tablet, Take 0.5 tablets (40 mg total) by mouth at bedtime., Disp: , Rfl:  .  carvedilol (COREG) 3.125 MG tablet, Take 1 tablet (3.125 mg total) by mouth 2 (two) times daily with a meal., Disp: 60 tablet, Rfl: 11 .  fenofibrate 160 MG tablet, Take 1 tablet (160 mg total) by mouth daily. (Patient taking differently: Take 160 mg by mouth every morning. ), Disp: 30 tablet, Rfl: 11 .  glipiZIDE (GLUCOTROL XL) 5 MG 24 hr tablet, Take 1 tablet (5 mg total) by mouth daily with breakfast., Disp: 30 tablet, Rfl: 11 .  metFORMIN (GLUCOPHAGE) 1000 MG tablet, Take 1,000 mg by mouth 2 (two) times daily with a meal. , Disp: , Rfl:  .  oxyCODONE (OXY IR/ROXICODONE) 5 MG immediate release tablet, Take 1-2 tablets (5-10 mg total) by mouth every 3 (three) hours as needed for severe pain., Disp: 30 tablet, Rfl: 0 .  warfarin (COUMADIN) 10 MG tablet, Take 1 tablet (10 mg total) by mouth daily at 6 PM. Or as directed, Disp: 30 tablet, Rfl: 1  Past Medical History: Past Medical History  Diagnosis Date  . Hyperlipidemia   . Bell's palsy   . Aortic stenosis due to bicuspid aortic valve     Probable bicuspid aortic valve: a. mod-sev by 2D ECHO 09/2014. AVA 0.9cm^2; b) 10/2014 Echo:.  Severe AS Mn-Pk Gradient 41-71 mmHg, AVA ~0.79 cm2, EF 60-65%  . Hypertriglyceridemia   . CAD S/P percutaneous coronary angioplasty 09/20/2014    a. inferolat STEMI ->> LHC-Angio: Proximal/ostial LAD 20%, OM2 99% -->> PCI: 3mm x 16 mm Promus Premier DES to the OM2.(~3.5 mm)  . ST elevation myocardial infarction (STEMI) of inferior wall (HCC) 09/20/2014  . Hypertension   . Heart murmur   . Shortness of breath dyspnea   . Diabetes mellitus type 2, controlled, with complications (HCC)     Tobacco Use: History  Smoking status  . Never Smoker   Smokeless tobacco  . Not on file    Labs: Recent Review Flowsheet Data    Labs for ITP Cardiac and Pulmonary Rehab Latest Ref Rng 10/09/2015 10/09/2015 10/09/2015 10/09/2015 10/10/2015   PHART 7.350 - 7.450 7.324(L) 7.316(L) - 7.339(L) -   PCO2ART 35.0 - 45.0 mmHg 43.8 40.9 - 43.1 -   HCO3 20.0 - 24.0 mEq/L 22.8 21.0 - 23.4 -   TCO2 0 - 100 mmol/L 24 22 23 25 21    ACIDBASEDEF 0.0 - 2.0 mmol/L 3.0(H) 5.0(H) - 3.0(H) -   O2SAT - 94.0 98.0 - 98.0 -      Capillary Blood Glucose: Lab Results  Component Value Date   GLUCAP 147* 10/23/2015   GLUCAP 156* 10/23/2015   GLUCAP  188* 10/22/2015   GLUCAP 148* 10/22/2015   GLUCAP 156* 10/22/2015     Exercise Target Goals: Date: 11/13/15  Exercise Program Goal: Individual exercise prescription set with THRR, safety & activity barriers. Participant demonstrates ability to understand and report RPE using BORG scale, to self-measure pulse accurately, and to acknowledge the importance of the exercise prescription.  Exercise Prescription Goal: Starting with aerobic activity 30 plus minutes a day, 3 days per week for initial exercise prescription. Provide home exercise prescription and guidelines that participant acknowledges understanding prior to discharge.  Activity Barriers & Risk Stratification:     Activity Barriers & Cardiac Risk Stratification - 11/13/15 0839    Activity Barriers & Cardiac Risk  Stratification   Activity Barriers Muscular Weakness;Incisional Pain   Comments knee pain surgery x4   Cardiac Risk Stratification High      6 Minute Walk:     6 Minute Walk      11/13/15 1041       6 Minute Walk   Phase Initial     Distance 1800 feet     Walk Time 6 minutes     # of Rest Breaks 0     MPH 3.41     METS 4.73     RPE 11     VO2 Peak 16.54     Symptoms No     Resting HR 89 bpm     Resting BP 118/68 mmHg     Max Ex. HR 106 bpm     Max Ex. BP 120/68 mmHg        Initial Exercise Prescription:     Initial Exercise Prescription - 11/13/15 1000    Date of Initial Exercise Prescription   Date 11/13/15   Treadmill   MPH 3.2   Grade 2   Minutes 10   METs 4.33   Bike   Level 1.6   Minutes 10   METs 4.7   NuStep   Level 3   Minutes 10   METs 2   Prescription Details   Frequency (times per week) 3   Duration Progress to 30 minutes of continuous aerobic without signs/symptoms of physical distress   Intensity   THRR 40-80% of Max Heartrate 67-134   Ratings of Perceived Exertion 11-13   Progression   Progression Continue to progress workloads to maintain intensity without signs/symptoms of physical distress.   Resistance Training   Training Prescription Yes   Weight 2lbs   Reps 10-12      Perform Capillary Blood Glucose checks as needed.  Exercise Prescription Changes:   Discharge Exercise Prescription (Final Exercise Prescription Changes):   Nutrition:  Target Goals: Understanding of nutrition guidelines, daily intake of sodium 1500mg , cholesterol 200mg , calories 30% from fat and 7% or less from saturated fats, daily to have 5 or more servings of fruits and vegetables.  Biometrics:     Pre Biometrics - 11/13/15 1144    Pre Biometrics   Height 5' 7.75" (1.721 m)   Weight 180 lb 12.4 oz (82 kg)   Waist Circumference 37 inches   Hip Circumference 37 inches   Waist to Hip Ratio 1 %   BMI (Calculated) 27.7   Triceps Skinfold 6 mm    % Body Fat 22 %   Grip Strength 6 kg   Flexibility 7.5 in   Single Leg Stand 30 seconds       Nutrition Therapy Plan and Nutrition Goals:     Nutrition Therapy & Goals -  11/13/15 1008    Nutrition Therapy   Diet Diabetic, Therapeutic Lifestyle Changes   Drug/Food Interactions Coumadin/Vit K   Personal Nutrition Goals   Personal Goal #1 Wt loss of 8 lb to a goal wt of 172 lb at graduation from Cardiac Rehab   Intervention Plan   Intervention Prescribe, educate and counsel regarding individualized specific dietary modifications aiming towards targeted core components such as weight, hypertension, lipid management, diabetes, heart failure and other comorbidities.   Expected Outcomes Short Term Goal: Understand basic principles of dietary content, such as calories, fat, sodium, cholesterol and nutrients.;Long Term Goal: Adherence to prescribed nutrition plan.      Nutrition Discharge: Rate Your Plate Scores:   Nutrition Goals Re-Evaluation:   Psychosocial: Target Goals: Acknowledge presence or absence of depression, maximize coping skills, provide positive support system. Participant is able to verbalize types and ability to use techniques and skills needed for reducing stress and depression.  Initial Review & Psychosocial Screening:   Quality of Life Scores:     Quality of Life - 11/13/15 1158    Quality of Life Scores   Health/Function Pre 24.67 %   Socioeconomic Pre 25.21 %   Psych/Spiritual Pre 28.29 %   Family Pre 27.6 %   GLOBAL Pre 25.96 %      PHQ-9:     Recent Review Flowsheet Data    There is no flowsheet data to display.      Psychosocial Evaluation and Intervention:   Psychosocial Re-Evaluation:   Vocational Rehabilitation: Provide vocational rehab assistance to qualifying candidates.   Vocational Rehab Evaluation & Intervention:     Vocational Rehab - 11/13/15 1102    Initial Vocational Rehab Evaluation & Intervention   Assessment shows  need for Vocational Rehabilitation No      Education: Education Goals: Education classes will be provided on a weekly basis, covering required topics. Participant will state understanding/return demonstration of topics presented.  Learning Barriers/Preferences:     Learning Barriers/Preferences - 11/12/15 1531    Learning Barriers/Preferences   Learning Barriers Sight   Learning Preferences Written Material;Audio;Verbal Instruction;Individual Instruction      Education Topics: Count Your Pulse:  -Group instruction provided by verbal instruction, demonstration, patient participation and written materials to support subject.  Instructors address importance of being able to find your pulse and how to count your pulse when at home without a heart monitor.  Patients get hands on experience counting their pulse with staff help and individually.   Heart Attack, Angina, and Risk Factor Modification:  -Group instruction provided by verbal instruction, video, and written materials to support subject.  Instructors address signs and symptoms of angina and heart attacks.    Also discuss risk factors for heart disease and how to make changes to improve heart health risk factors.   Functional Fitness:  -Group instruction provided by verbal instruction, demonstration, patient participation, and written materials to support subject.  Instructors address safety measures for doing things around the house.  Discuss how to get up and down off the floor, how to pick things up properly, how to safely get out of a chair without assistance, and balance training.   Meditation and Mindfulness:  -Group instruction provided by verbal instruction, patient participation, and written materials to support subject.  Instructor addresses importance of mindfulness and meditation practice to help reduce stress and improve awareness.  Instructor also leads participants through a meditation exercise.    Stretching for  Flexibility and Mobility:  -Group instruction provided by  verbal instruction, patient participation, and written materials to support subject.  Instructors lead participants through series of stretches that are designed to increase flexibility thus improving mobility.  These stretches are additional exercise for major muscle groups that are typically performed during regular warm up and cool down.   Hands Only CPR Anytime:  -Group instruction provided by verbal instruction, video, patient participation and written materials to support subject.  Instructors co-teach with AHA video for hands only CPR.  Participants get hands on experience with mannequins.   Nutrition I class: Heart Healthy Eating:  -Group instruction provided by PowerPoint slides, verbal discussion, and written materials to support subject matter. The instructor gives an explanation and review of the Therapeutic Lifestyle Changes diet recommendations, which includes a discussion on lipid goals, dietary fat, sodium, fiber, plant stanol/sterol esters, sugar, and the components of a well-balanced, healthy diet.   Nutrition II class: Lifestyle Skills:  -Group instruction provided by PowerPoint slides, verbal discussion, and written materials to support subject matter. The instructor gives an explanation and review of label reading, grocery shopping for heart health, heart healthy recipe modifications, and ways to make healthier choices when eating out.   Diabetes Question & Answer:  -Group instruction provided by PowerPoint slides, verbal discussion, and written materials to support subject matter. The instructor gives an explanation and review of diabetes co-morbidities, pre- and post-prandial blood glucose goals, pre-exercise blood glucose goals, signs, symptoms, and treatment of hypoglycemia and hyperglycemia, and foot care basics.   Diabetes Blitz:  -Group instruction provided by PowerPoint slides, verbal discussion, and written  materials to support subject matter. The instructor gives an explanation and review of the physiology behind type 1 and type 2 diabetes, diabetes medications and rational behind using different medications, pre- and post-prandial blood glucose recommendations and Hemoglobin A1c goals, diabetes diet, and exercise including blood glucose guidelines for exercising safely.    Portion Distortion:  -Group instruction provided by PowerPoint slides, verbal discussion, written materials, and food models to support subject matter. The instructor gives an explanation of serving size versus portion size, changes in portions sizes over the last 20 years, and what consists of a serving from each food group.   Stress Management:  -Group instruction provided by verbal instruction, video, and written materials to support subject matter.  Instructors review role of stress in heart disease and how to cope with stress positively.     Exercising on Your Own:  -Group instruction provided by verbal instruction, power point, and written materials to support subject.  Instructors discuss benefits of exercise, components of exercise, frequency and intensity of exercise, and end points for exercise.  Also discuss use of nitroglycerin and activating EMS.  Review options of places to exercise outside of rehab.  Review guidelines for sex with heart disease.   Cardiac Drugs I:  -Group instruction provided by verbal instruction and written materials to support subject.  Instructor reviews cardiac drug classes: antiplatelets, anticoagulants, beta blockers, and statins.  Instructor discusses reasons, side effects, and lifestyle considerations for each drug class.   Cardiac Drugs II:  -Group instruction provided by verbal instruction and written materials to support subject.  Instructor reviews cardiac drug classes: angiotensin converting enzyme inhibitors (ACE-I), angiotensin II receptor blockers (ARBs), nitrates, and calcium  channel blockers.  Instructor discusses reasons, side effects, and lifestyle considerations for each drug class.   Anatomy and Physiology of the Circulatory System:  -Group instruction provided by verbal instruction, video, and written materials to support subject.  Reviews functional  anatomy of heart, how it relates to various diagnoses, and what role the heart plays in the overall system.   Knowledge Questionnaire Score:     Knowledge Questionnaire Score - 11/13/15 1057    Knowledge Questionnaire Score   Pre Score 22/24      Personal Goals and Risk Factors at Admission:     Personal Goals and Risk Factors at Admission - 11/13/15 0840    Core Components/Risk Factors/Patient Goals on Admission    Weight Management Yes   Intervention Weight Management: Develop a combined nutrition and exercise program designed to reach desired caloric intake, while maintaining appropriate intake of nutrient and fiber, sodium and fats, and appropriate energy expenditure required for the weight goal.;Weight Management: Provide education and appropriate resources to help participant work on and attain dietary goals.;Weight Management/Obesity: Establish reasonable short term and long term weight goals.   Admit Weight 180 lb 12.4 oz (82 kg)   Goal Weight: Short Term 175 lb (79.379 kg)   Goal Weight: Long Term 172 lb (78.019 kg)   Expected Outcomes Short Term: Continue to assess and modify interventions until short term weight is achieved.;Long Term: Adherence to nutrition and physical activity/exercise program aimed toward attainment of established weight goal.   Sedentary --  Improve stamina overall   Intervention Provide advice, education, support and counseling about physical activity/exercise needs.;Develop an individualized exercise prescription for aerobic and resistive training based on initial evaluation findings, risk stratification, comorbidities and participant's personal goals.   Expected Outcomes  Achievement of increased cardiorespiratory fitness and enhanced flexibility, muscular endurance and strength shown through measurements of functional capaciy and personal statement of participant.   Diabetes Yes   Intervention Provide education about signs/symptoms and action to take for hypo/hyperglycemia.;Provide education about proper nutrition, including hydration, and aerobic/resistive exercise prescription along with prescribed medications to achieve blood glucose in normal ranges: Fasting glucose 65-99 mg/dL   Expected Outcomes Short Term: Participant verbalizes understanding of the signs/symptoms and immediate care of hyper/hypoglycemia, proper foot care and importance of medication, aerobic/resistive exercise and nutrition plan for blood glucose control.;Long Term: Attainment of HbA1C < 7%.   Hypertension Yes   Intervention Provide education on lifestyle modifcations including regular physical activity/exercise, weight management, moderate sodium restriction and increased consumption of fresh fruit, vegetables, and low fat dairy, alcohol moderation, and smoking cessation.;Monitor prescription use compliance.   Expected Outcomes Short Term: Continued assessment and intervention until BP is < 140/85mm HG in hypertensive participants. < 130/14mm HG in hypertensive participants with diabetes, heart failure or chronic kidney disease.;Long Term: Maintenance of blood pressure at goal levels.   Lipids Yes   Intervention Provide education and support for participant on nutrition & aerobic/resistive exercise along with prescribed medications to achieve LDL 70mg , HDL >40mg .   Expected Outcomes Short Term: Participant states understanding of desired cholesterol values and is compliant with medications prescribed. Participant is following exercise prescription and nutrition guidelines.;Long Term: Cholesterol controlled with medications as prescribed, with individualized exercise RX and with personalized  nutrition plan. Value goals: LDL < 70mg , HDL > 40 mg.   Stress Yes   Intervention Offer individual and/or small group education and counseling on adjustment to heart disease, stress management and health-related lifestyle change. Teach and support self-help strategies.   Expected Outcomes Short Term: Participant demonstrates changes in health-related behavior, relaxation and other stress management skills, ability to obtain effective social support, and compliance with psychotropic medications if prescribed.;Long Term: Emotional wellbeing is indicated by absence of clinically significant psychosocial distress or  social isolation.   Personal Goal Other Yes   Personal Goal Learn limits for exercise   Intervention Adhere to exercise prescription for program   Expected Outcomes Know how to exercise independently      Personal Goals and Risk Factors Review:   Personal Goals Discharge (Final Personal Goals and Risk Factors Review):    ITP Comments:     ITP Comments      11/13/15 0806 11/13/15 1040         ITP Comments Medical Director is Dr. Armanda Magic, MD Medical Director is Dr. Armanda Magic, MD         Comments: Pt attended orientation.  Intitial ITP and exercise prescription.

## 2015-11-17 ENCOUNTER — Encounter (HOSPITAL_COMMUNITY)
Admission: RE | Admit: 2015-11-17 | Discharge: 2015-11-17 | Disposition: A | Discharge status: Home / Self Care | Payer: BC Managed Care – PPO | Source: Ambulatory Visit | Attending: Cardiology | Admitting: Cardiology

## 2015-11-17 DIAGNOSIS — Z954 Presence of other heart-valve replacement: Secondary | ICD-10-CM | POA: Diagnosis not present

## 2015-11-17 DIAGNOSIS — Z952 Presence of prosthetic heart valve: Secondary | ICD-10-CM

## 2015-11-17 LAB — GLUCOSE, CAPILLARY
Glucose-Capillary: 243 mg/dL — ABNORMAL HIGH (ref 65–99)
Glucose-Capillary: 181 mg/dL — ABNORMAL HIGH (ref 65–99)

## 2015-11-17 NOTE — Progress Notes (Signed)
Daily Session Note  Patient Details  Name: Dennis Bates MRN: [id number] Date of Birth: [birth year] Referring Provider:  Donald Prose, MD  Encounter Date: 11/17/2015  Check In:   Capillary Blood Glucose: Results for orders placed or performed during the hospital encounter of 11/17/15 (from the past 24 hour(s))  Glucose, capillary     Status: Abnormal   Collection Time: 11/17/15  9:49 AM  Result Value Ref Range   Glucose-Capillary 243 (H) 65 - 99 mg/dL     Goals Met:  Exercise tolerated well  Goals Unmet:  Not Applicable  Comments: Pt started cardiac rehab today.  Pt tolerated light exercise without difficulty. VSS, telemetry-Sinus Rhythm, asymptomatic.  Medication list reconciled. Pt denies barriers to medicaiton compliance.  PSYCHOSOCIAL ASSESSMENT:  PHQ-0. Pt exhibits positive coping skills, hopeful outlook with supportive family. No psychosocial needs identified at this time, no psychosocial interventions necessary.      Pt goals for cardiac rehab short term: to learn limits, decrease fatigue and increase stamina. Long term goals, less muscle and incisional soreness.  Pt encouraged to participate in exercising on your own  to have more success at achieving these goals.   Pt oriented to exercise equipment and routine.    Understanding verbalized.   Dr. Fransico Him is Medical Director for Cardiac Rehab at [facility].

## 2015-11-19 ENCOUNTER — Encounter (HOSPITAL_COMMUNITY)
Admission: RE | Admit: 2015-11-19 | Discharge: 2015-11-19 | Disposition: A | Discharge status: Home / Self Care | Payer: BC Managed Care – PPO | Source: Ambulatory Visit | Attending: Cardiology | Admitting: Cardiology

## 2015-11-19 DIAGNOSIS — Z954 Presence of other heart-valve replacement: Secondary | ICD-10-CM | POA: Diagnosis not present

## 2015-11-19 DIAGNOSIS — Z952 Presence of prosthetic heart valve: Secondary | ICD-10-CM

## 2015-11-19 LAB — GLUCOSE, CAPILLARY
Glucose-Capillary: 143 mg/dL — ABNORMAL HIGH (ref 65–99)
Glucose-Capillary: 109 mg/dL — ABNORMAL HIGH (ref 65–99)

## 2015-11-19 NOTE — Progress Notes (Signed)
Patient reported feeling lightheaded after getting off the nustep. Telemetry rhythm Sinus. Sitting blood pressure 118/60. Standing blood pressure 122/60. [name] asked for some lemonade. Patient reported feeling better. CBG 109. No further symptoms noted for the remainder of today's exercise  Session. Dennis Bates was also given a banana. Will continue to monitor the patient throughout  the program.

## 2015-11-21 ENCOUNTER — Encounter (HOSPITAL_COMMUNITY)
Admission: RE | Admit: 2015-11-21 | Discharge: 2015-11-21 | Disposition: A | Discharge status: Home / Self Care | Payer: BC Managed Care – PPO | Source: Ambulatory Visit | Attending: Cardiology | Admitting: Cardiology

## 2015-11-21 DIAGNOSIS — Z954 Presence of other heart-valve replacement: Secondary | ICD-10-CM | POA: Diagnosis not present

## 2015-11-21 DIAGNOSIS — Z952 Presence of prosthetic heart valve: Secondary | ICD-10-CM

## 2015-11-21 LAB — GLUCOSE, CAPILLARY
Glucose-Capillary: 156 mg/dL — ABNORMAL HIGH (ref 65–99)
Glucose-Capillary: 125 mg/dL — ABNORMAL HIGH (ref 65–99)

## 2015-11-24 ENCOUNTER — Encounter (HOSPITAL_COMMUNITY)
Admission: RE | Admit: 2015-11-24 | Discharge: 2015-11-24 | Disposition: A | Discharge status: Home / Self Care | Payer: BC Managed Care – PPO | Source: Ambulatory Visit | Attending: Cardiology | Admitting: Cardiology

## 2015-11-24 DIAGNOSIS — Z954 Presence of other heart-valve replacement: Secondary | ICD-10-CM | POA: Diagnosis not present

## 2015-11-24 DIAGNOSIS — Z952 Presence of prosthetic heart valve: Secondary | ICD-10-CM

## 2015-11-24 LAB — GLUCOSE, CAPILLARY
Glucose-Capillary: 180 mg/dL — ABNORMAL HIGH (ref 65–99)
Glucose-Capillary: 121 mg/dL — ABNORMAL HIGH (ref 65–99)

## 2015-11-26 ENCOUNTER — Encounter (HOSPITAL_COMMUNITY)
Admission: RE | Admit: 2015-11-26 | Discharge: 2015-11-26 | Disposition: A | Discharge status: Home / Self Care | Payer: BC Managed Care – PPO | Source: Ambulatory Visit | Attending: Cardiology | Admitting: Cardiology

## 2015-11-26 DIAGNOSIS — Z954 Presence of other heart-valve replacement: Secondary | ICD-10-CM | POA: Diagnosis not present

## 2015-11-26 DIAGNOSIS — Z952 Presence of prosthetic heart valve: Secondary | ICD-10-CM

## 2015-11-26 LAB — GLUCOSE, CAPILLARY: Glucose-Capillary: 218 mg/dL — ABNORMAL HIGH (ref 65–99)

## 2015-11-26 NOTE — Progress Notes (Signed)
Reviewed home exercise with pt today.  Pt plans to walk and ride bike at home for exercise.  Reviewed THR, pulse, RPE, sign and symptoms, NTG use, and when to call 911 or MD.  Also discussed weather considerations and indoor options.  Pt voiced understanding. Dennis PierceJessica Marialena Wollen, MA, ACSM RCEP

## 2015-11-28 ENCOUNTER — Encounter (HOSPITAL_COMMUNITY)
Admission: RE | Admit: 2015-11-28 | Discharge: 2015-11-28 | Disposition: A | Discharge status: Home / Self Care | Payer: BC Managed Care – PPO | Source: Ambulatory Visit | Attending: Cardiology | Admitting: Cardiology

## 2015-11-28 ENCOUNTER — Ambulatory Visit (INDEPENDENT_AMBULATORY_CARE_PROVIDER_SITE_OTHER): Payer: BC Managed Care – PPO | Admitting: Pharmacist Clinician (PhC)/ Clinical Pharmacy Specialist

## 2015-11-28 DIAGNOSIS — Z952 Presence of prosthetic heart valve: Secondary | ICD-10-CM

## 2015-11-28 DIAGNOSIS — Z954 Presence of other heart-valve replacement: Secondary | ICD-10-CM

## 2015-11-28 DIAGNOSIS — Z7901 Long term (current) use of anticoagulants: Secondary | ICD-10-CM | POA: Diagnosis not present

## 2015-11-28 LAB — GLUCOSE, CAPILLARY: Glucose-Capillary: 220 mg/dL — ABNORMAL HIGH (ref 65–99)

## 2015-11-28 LAB — POCT INR: INR: 2.7

## 2015-11-28 MED ORDER — WARFARIN SODIUM 5 MG PO TABS: ORAL_TABLET | ORAL | Status: DC

## 2015-12-01 ENCOUNTER — Encounter (HOSPITAL_COMMUNITY)
Admission: RE | Admit: 2015-12-01 | Discharge: 2015-12-01 | Disposition: A | Discharge status: Home / Self Care | Payer: BC Managed Care – PPO | Source: Ambulatory Visit | Attending: Cardiology | Admitting: Cardiology

## 2015-12-01 DIAGNOSIS — Z954 Presence of other heart-valve replacement: Secondary | ICD-10-CM | POA: Diagnosis not present

## 2015-12-01 DIAGNOSIS — Z952 Presence of prosthetic heart valve: Secondary | ICD-10-CM

## 2015-12-03 ENCOUNTER — Encounter (HOSPITAL_COMMUNITY)
Admission: RE | Admit: 2015-12-03 | Discharge: 2015-12-03 | Disposition: A | Discharge status: Home / Self Care | Payer: BC Managed Care – PPO | Source: Ambulatory Visit | Attending: Cardiology | Admitting: Cardiology

## 2015-12-03 DIAGNOSIS — Z952 Presence of prosthetic heart valve: Secondary | ICD-10-CM

## 2015-12-03 DIAGNOSIS — Z954 Presence of other heart-valve replacement: Secondary | ICD-10-CM | POA: Diagnosis not present

## 2015-12-05 ENCOUNTER — Encounter (HOSPITAL_COMMUNITY)
Admission: RE | Admit: 2015-12-05 | Discharge: 2015-12-05 | Disposition: A | Discharge status: Home / Self Care | Payer: BC Managed Care – PPO | Source: Ambulatory Visit

## 2015-12-05 ENCOUNTER — Telehealth: Payer: Self-pay | Admitting: *Deleted

## 2015-12-05 ENCOUNTER — Ambulatory Visit (HOSPITAL_COMMUNITY)
Admission: RE | Admit: 2015-12-05 | Discharge: 2015-12-05 | Disposition: A | Discharge status: Home / Self Care | Payer: BC Managed Care – PPO | Source: Ambulatory Visit | Attending: Cardiology | Admitting: Cardiology

## 2015-12-05 DIAGNOSIS — R079 Chest pain, unspecified: Secondary | ICD-10-CM

## 2015-12-05 DIAGNOSIS — Z952 Presence of prosthetic heart valve: Secondary | ICD-10-CM

## 2015-12-05 DIAGNOSIS — Z954 Presence of other heart-valve replacement: Secondary | ICD-10-CM | POA: Diagnosis not present

## 2015-12-05 LAB — GLUCOSE, CAPILLARY: Glucose-Capillary: 182 mg/dL — ABNORMAL HIGH (ref 65–99)

## 2015-12-05 NOTE — Progress Notes (Signed)
Dennis Bates reported that he had left anterior chest discomfort yesterday morning that lasted about 20 minutes. Dennis Bates reported that the pain was a 5 on a 1-10 scale. Dennis Bates said he took a sublingual nitroglycerin and that it relieved his pain. Dennis Bates at Dennis Bates's office called and notified. Dennis Bates denies having any chest pain today. Telemetry rhythm Sinus. Vital signs stable. 12 lead ECG obtained. Dennis Bates reviewed 12 lead and said Dennis Bates may continue exercise at cardiac rehab but does want the patient to be evaluated in the office in the near future. Dennis Bates spoke with Dennis Bates over the phone and made an appointment for office follow up on Wednesday after exercise at cardiac rehab. Dennis Bates exercised in the 11:15 exercise class without complaints. Will continue to monitor the patient throughout  the program.

## 2015-12-05 NOTE — Telephone Encounter (Signed)
[facility] Patient there for rehab and he reported episode of chest pain yesterday Pain was off and on for about 20 minutes, described as sharp, left anterior chest. Pain  5 out of 10 Blood pressure was 128/80 at rehab, during chest pains yesterday systolic 116 Took NTG and pain resolved after 1 Has had episodes of these pains with less intensity prior, unsure how long but has never used NTG  12 lead EKG ordered and reviewed by Dr Jens Somrenshaw Per Jens Somrenshaw based on EKG and no current chest pains patient ok to proceed with Rehab, advised Byrd HesselbachMaria Did schedule follow up ov for patient next week, patient aware of date and time

## 2015-12-05 NOTE — Progress Notes (Signed)
Dennis Bates 55 y.o. male Nutrition Note Spoke with pt. Nutrition Plan and Nutrition Survey goals reviewed with pt. Pt is following Step 1 of the Therapeutic Lifestyle Changes diet. Pt wants to lose wt. Wt loss tips reviewed. Pt is diabetic. Last A1c indicates blood glucose not optimally controlled. This Clinical research associatewriter went over Diabetes Education test results. Pt checks CBG's 1 time daily. Fasting CBG's reportedly "around 115 mg/dL." Pt states "my doctor wants my fasting blood sugar to be < 100." Per discussion, pt CBG's were elevated due to cost of Januvia (previous DM med) and Effient so pt stopped taking Januvia. Pt does not have a PCP at this time. "I have to find one that is in network." Pt plans on finding a PCP "in the next week." Pt reports CBG's drop significantly with exercise despite eating a "good breakfast." Pt expressed understanding of the information reviewed. Pt aware of nutrition education classes offered.  Lab Results  Component Value Date   HGBA1C 8.9* 10/07/2015   Wt Readings from Last 3 Encounters:  11/13/15 180 lb 12.4 oz (82 kg)  11/12/15 178 lb (80.74 kg)  11/03/15 178 lb (80.74 kg)   Nutrition Diagnosis ? Food-and nutrition-related knowledge deficit related to lack of exposure to information as related to diagnosis of: ? CVD ? DM ? Overweight related to excessive energy intake as evidenced by a BMI of 27.7  Nutrition RX/ Estimated Daily Nutrition Needs for: wt loss 1450-1950 Kcal, 40-50 gm fat, 9-13 gm sat fat, 1.4-2.0 gm trans-fat, <1500 mg sodium, 175-250 gm CHO   Nutrition Intervention ? Pt's individual nutrition plan reviewed with pt. ? Benefits of adopting Therapeutic Lifestyle Changes discussed when Medficts reviewed. ? Pt to attend the Portion Distortion class ? Pt to attend the Diabetes Q & A class ? Pt to attend the   ? Nutrition I class                  ? Nutrition II class     ? Diabetes Blitz class ? Pt is going to try taking Glipizide before lunch on  exercise days only to see if CBG's are more stable during rehab.  ? Continue client-centered nutrition education by RD, as part of interdisciplinary care. Goal(s) ? Pt to identify and limit food sources of saturated fat, trans fat, and sodium ? Pt to identify food quantities necessary to achieve weight loss of 6-24 lb (2.7-10.9 kg) at graduation from cardiac rehab. Pt's personal goal wt is 172 lb. ? CBG concentrations in the normal range or as close to normal as is safely possible. Monitor and Evaluate progress toward nutrition goal with team. Mickle PlumbEdna Laraine Samet, M.Ed, RD, LDN, CDE 12/05/2015 10:20 AM

## 2015-12-08 ENCOUNTER — Encounter (HOSPITAL_COMMUNITY)
Admission: RE | Admit: 2015-12-08 | Discharge: 2015-12-08 | Disposition: A | Discharge status: Home / Self Care | Payer: BC Managed Care – PPO | Source: Ambulatory Visit | Attending: Cardiology | Admitting: Cardiology

## 2015-12-08 DIAGNOSIS — Z954 Presence of other heart-valve replacement: Secondary | ICD-10-CM | POA: Diagnosis not present

## 2015-12-08 DIAGNOSIS — Z952 Presence of prosthetic heart valve: Secondary | ICD-10-CM

## 2015-12-09 ENCOUNTER — Other Ambulatory Visit: Payer: Self-pay | Admitting: *Deleted

## 2015-12-09 MED ORDER — CARVEDILOL 3.125 MG PO TABS: 3.1250 mg | ORAL_TABLET | Freq: Two times a day (BID) | ORAL | Status: DC

## 2015-12-09 MED ORDER — FENOFIBRATE 160 MG PO TABS: 160.0000 mg | ORAL_TABLET | Freq: Every day | ORAL | Status: DC

## 2015-12-09 MED ORDER — GLIPIZIDE ER 5 MG PO TB24: 5.0000 mg | ORAL_TABLET | Freq: Every day | ORAL | Status: DC

## 2015-12-10 ENCOUNTER — Ambulatory Visit (INDEPENDENT_AMBULATORY_CARE_PROVIDER_SITE_OTHER): Payer: BC Managed Care – PPO | Admitting: Physician Assistant

## 2015-12-10 ENCOUNTER — Encounter: Payer: Self-pay | Admitting: Physician Assistant

## 2015-12-10 ENCOUNTER — Encounter (HOSPITAL_COMMUNITY)
Admission: RE | Admit: 2015-12-10 | Discharge: 2015-12-10 | Disposition: A | Discharge status: Home / Self Care | Payer: BC Managed Care – PPO | Source: Ambulatory Visit | Attending: Cardiology | Admitting: Cardiology

## 2015-12-10 VITALS — BP 140/70 | HR 84 | Ht 67.0 in | Wt 183.0 lb

## 2015-12-10 DIAGNOSIS — Z9861 Coronary angioplasty status: Secondary | ICD-10-CM

## 2015-12-10 DIAGNOSIS — R0789 Other chest pain: Secondary | ICD-10-CM

## 2015-12-10 DIAGNOSIS — Z7901 Long term (current) use of anticoagulants: Secondary | ICD-10-CM | POA: Diagnosis not present

## 2015-12-10 DIAGNOSIS — I2510 Atherosclerotic heart disease of native coronary artery without angina pectoris: Secondary | ICD-10-CM | POA: Diagnosis not present

## 2015-12-10 DIAGNOSIS — Z952 Presence of prosthetic heart valve: Secondary | ICD-10-CM

## 2015-12-10 DIAGNOSIS — R079 Chest pain, unspecified: Secondary | ICD-10-CM | POA: Insufficient documentation

## 2015-12-10 DIAGNOSIS — Z954 Presence of other heart-valve replacement: Secondary | ICD-10-CM | POA: Diagnosis not present

## 2015-12-10 MED ORDER — ATORVASTATIN CALCIUM 80 MG PO TABS: 40.0000 mg | ORAL_TABLET | Freq: Every day | ORAL | Status: DC

## 2015-12-10 NOTE — Progress Notes (Signed)
Patient ID: Dennis Bates, male   DOB: 1961/06/23, 55 y.o.   MRN: [id number]    Date:  12/10/2015   ID:  Dennis Bates, DOB 1961/06/23, MRN [id number]  PCP:  Leanor RubensteinSUN,VYVYAN Y, MD  Primary Cardiologist:  Herbie BaltimoreHarding  Chief Complaint  Patient presents with  . Follow-up    some chest pain last week     History of Present Illness: Dennis Bates is a 55 y.o. male with a history of bicuspid aortic valve and CAD with drug-eluting stent to the OM 2 and 2016 for single vessel disease, hypertension and diabetes  D/C 10/17/2015 after mech AoVR  D/c 10/23/2015 after admit for fever, symptoms improved and he was discharged after 3 days of antibiotics.  Patient presents today for evaluation of chest pain. Occurred last Thursday left chest under his clavicle. The pain was sharp and lasted 3-5 seconds and would come and go. He was at rest when it was occurring. He did try at night sublingual nitroglycerin and the pain eventually eased off later in the evening he had another occurrence and then he's had nothing since. He exercised the following day cardiac rehabilitation and then again today and Monday without any problems.  There was no associated shortness of breath nausea vomiting diaphoresis.   Today he feels really good. The patient currently denies fever,  orthopnea, dizziness, PND, cough, congestion, abdominal pain, hematochezia, melena, lower extremity edema, claudication.  Wt Readings from Last 3 Encounters:  12/10/15 183 lb (83.008 kg)  11/13/15 180 lb 12.4 oz (82 kg)  11/12/15 178 lb (80.74 kg)    Lipid Panel     Component Value Date/Time   CHOL 107 11/05/2014 0841   TRIG 182.0* 11/05/2014 0841   HDL 28.20* 11/05/2014 0841   CHOLHDL 4 11/05/2014 0841   VLDL 36.4 11/05/2014 0841   LDLCALC 42 11/05/2014 0841   LDLDIRECT 43 09/21/2014 1108     Past Medical History  Diagnosis Date  . Hyperlipidemia   . Bell's palsy   . Aortic stenosis due to bicuspid aortic valve     Probable bicuspid aortic  valve: a. mod-sev by 2D ECHO 09/2014. AVA 0.9cm^2; b) 10/2014 Echo:. Severe AS Mn-Pk Gradient 41-71 mmHg, AVA ~0.79 cm2, EF 60-65%  . Hypertriglyceridemia   . CAD S/P percutaneous coronary angioplasty 09/20/2014    a. inferolat STEMI ->> LHC-Angio: Proximal/ostial LAD 20%, OM2 99% -->> PCI: 3mm x 16 mm Promus Premier DES to the OM2.(~3.5 mm)  . ST elevation myocardial infarction (STEMI) of inferior wall (HCC) 09/20/2014  . Hypertension   . Heart murmur   . Shortness of breath dyspnea   . Diabetes mellitus type 2, controlled, with complications [facility](HCC)     Current Outpatient Prescriptions  Medication Sig Dispense Refill  . acetaminophen (TYLENOL) 325 MG tablet Take 2 tablets (650 mg total) by mouth every 6 (six) hours as needed for mild pain.    Marland Kitchen. aspirin EC 81 MG EC tablet Take 1 tablet (81 mg total) by mouth daily. (Patient taking differently: Take 81 mg by mouth every morning. )    . atorvastatin (LIPITOR) 80 MG tablet Take 0.5 tablets (40 mg total) by mouth at bedtime.    . carvedilol (COREG) 3.125 MG tablet Take 1 tablet (3.125 mg total) by mouth 2 (two) times daily with a meal. 60 tablet 1  . fenofibrate 160 MG tablet Take 1 tablet (160 mg total) by mouth daily. 30 tablet 1  . glipiZIDE (GLUCOTROL XL) 5 MG 24 hr tablet Take 1  tablet (5 mg total) by mouth daily with breakfast. 30 tablet 1  . metFORMIN (GLUCOPHAGE) 1000 MG tablet Take 1,000 mg by mouth 2 (two) times daily with a meal.     . warfarin (COUMADIN) 5 MG tablet Take 1.5 to 2 tablets by mouth daily or as directed by coumadin clinic 180 tablet 1   No current facility-administered medications for this visit.    Allergies:   No Known Allergies  Social History:  The patient  reports that he has never smoked. He does not have any smokeless tobacco history on file. He reports that he drinks alcohol. He reports that he does not use illicit drugs.   Family history:   Family History  Problem Relation Age of Onset  . CAD Neg Hx     per  wife  . Heart disease Mother     ROS:  Please see the history of present illness.  All other systems reviewed and negative.   PHYSICAL EXAM: VS:  BP 140/70 mmHg  Pulse 84  Ht  (1.702 m)  Wt 183 lb (83.008 kg)  BMI 28.66 kg/m2 Well nourished, well developed, in no acute distress HEENT: Pupils are equal round react to light accommodation extraocular movements are intact.  Neck: no JVDNo cervical lymphadenopathy. Cardiac: Regular rate and rhythm without murmurs rubs or gallops.Mechanical valve clicks noted. Lungs:  clear to auscultation bilaterally, no wheezing, rhonchi or rales Abd: soft, nontender, positive bowel sounds all quadrants, no hepatosplenomegaly Ext: no lower extremity edema.  2+ radial and dorsalis pedis pulses. Skin: warm and dry Neuro:  Grossly normal    ASSESSMENT AND PLAN:  Problem List Items Addressed This Visit    Long term (current) use of anticoagulants [Z79.01]   Chest pain - Primary   CAD S/P p DES PCI to the Circumflex-OM 2 (Chronic)   Aortic valve replaced     Mr. Debold's pain in his chest up under the left clavicle was not related to his heart. He just had a left heart catheterization prior to her aortic valve surgery which showed a patent circumflex stent, 25% proximal RCA and 20% proximal LAD after the first diagonal. Note this is significant enough to cause chest pain. It is likely referred pain from his incision may be nerve irritation or inflammation. He is exercising without any problems at cardiac rehabilitation. Blood pressure rehabilitation was a partially 120/60. He doesn't have any changes to his medications at this time. Follow-up as scheduled with Dr. [name].

## 2015-12-10 NOTE — Patient Instructions (Signed)
Continue same medications   Keep appointment with Dr.Harding Thursday 02/05/16 at 10:30 am.

## 2015-12-10 NOTE — Progress Notes (Signed)
Cardiac Individual Treatment Plan  Patient Details  Name: Dennis Bates MRN: [id number] Date of Birth: [birth year] Referring Provider:  Deatra James, MD  Initial Encounter Date:       CARDIAC REHAB PHASE II ORIENTATION from 11/13/2015 in [facility] CARDIAC REHAB   Date  11/13/15      Visit Diagnosis: S/P AVR  Patient's Home Medications on Admission:  Current outpatient prescriptions:  .  acetaminophen (TYLENOL) 325 MG tablet, Take 2 tablets (650 mg total) by mouth every 6 (six) hours as needed for mild pain., Disp: , Rfl:  .  aspirin EC 81 MG EC tablet, Take 1 tablet (81 mg total) by mouth daily. (Patient taking differently: Take 81 mg by mouth every morning. ), Disp: , Rfl:  .  atorvastatin (LIPITOR) 80 MG tablet, Take 0.5 tablets (40 mg total) by mouth at bedtime., Disp: 30 tablet, Rfl: 6 .  carvedilol (COREG) 3.125 MG tablet, Take 1 tablet (3.125 mg total) by mouth 2 (two) times daily with a meal., Disp: 60 tablet, Rfl: 1 .  fenofibrate 160 MG tablet, Take 1 tablet (160 mg total) by mouth daily., Disp: 30 tablet, Rfl: 1 .  glipiZIDE (GLUCOTROL XL) 5 MG 24 hr tablet, Take 1 tablet (5 mg total) by mouth daily with breakfast., Disp: 30 tablet, Rfl: 1 .  metFORMIN (GLUCOPHAGE) 1000 MG tablet, Take 1,000 mg by mouth 2 (two) times daily with a meal. , Disp: , Rfl:  .  warfarin (COUMADIN) 5 MG tablet, Take 1.5 to 2 tablets by mouth daily or as directed by coumadin clinic, Disp: 180 tablet, Rfl: 1  Past Medical History: Past Medical History  Diagnosis Date  . Hyperlipidemia   . Bell's palsy   . Aortic stenosis due to bicuspid aortic valve     Probable bicuspid aortic valve: a. mod-sev by 2D ECHO 09/2014. AVA 0.9cm^2; b) 10/2014 Echo:. Severe AS Mn-Pk Gradient 41-71 mmHg, AVA ~0.79 cm2, EF 60-65%  . Hypertriglyceridemia   . CAD S/P percutaneous coronary angioplasty 09/20/2014    a. inferolat STEMI ->> LHC-Angio: Proximal/ostial LAD 20%, OM2 99% -->> PCI: 3mm x 16 mm Promus  Premier DES to the OM2.(~3.5 mm)  . ST elevation myocardial infarction (STEMI) of inferior wall (HCC) 09/20/2014  . Hypertension   . Heart murmur   . Shortness of breath dyspnea   . Diabetes mellitus type 2, controlled, with complications (HCC)     Tobacco Use: History  Smoking status  . Never Smoker   Smokeless tobacco  . Not on file    Labs:     Recent Review Flowsheet Data    Labs for ITP Cardiac and Pulmonary Rehab Latest Ref Rng 10/09/2015 10/09/2015 10/09/2015 10/09/2015 10/10/2015   PHART 7.350 - 7.450 7.324(L) 7.316(L) - 7.339(L) -   PCO2ART 35.0 - 45.0 mmHg 43.8 40.9 - 43.1 -   HCO3 20.0 - 24.0 mEq/L 22.8 21.0 - 23.4 -   TCO2 0 - 100 mmol/L 24 22 23 25 21    ACIDBASEDEF 0.0 - 2.0 mmol/L 3.0(H) 5.0(H) - 3.0(H) -   O2SAT - 94.0 98.0 - 98.0 -      Capillary Blood Glucose: Lab Results  Component Value Date   GLUCAP 182* 12/05/2015   GLUCAP 220* 11/28/2015   GLUCAP 218* 11/26/2015   GLUCAP 121* 11/24/2015   GLUCAP 180* 11/24/2015     Exercise Target Goals:    Exercise Program Goal: Individual exercise prescription set with THRR, safety & activity barriers. Participant demonstrates ability to understand  and report RPE using BORG scale, to self-measure pulse accurately, and to acknowledge the importance of the exercise prescription.  Exercise Prescription Goal: Starting with aerobic activity 30 plus minutes a day, 3 days per week for initial exercise prescription. Provide home exercise prescription and guidelines that participant acknowledges understanding prior to discharge.  Activity Barriers & Risk Stratification:     Activity Barriers & Cardiac Risk Stratification - 11/13/15 0839    Activity Barriers & Cardiac Risk Stratification   Activity Barriers Muscular Weakness;Incisional Pain   Comments knee pain surgery x4   Cardiac Risk Stratification High      6 Minute Walk:     6 Minute Walk      11/13/15 1041       6 Minute Walk   Phase Initial      Distance 1800 feet     Walk Time 6 minutes     # of Rest Breaks 0     MPH 3.41     METS 4.73     RPE 11     VO2 Peak 16.54     Symptoms No     Resting HR 89 bpm     Resting BP 118/68 mmHg     Max Ex. HR 106 bpm     Max Ex. BP 120/68 mmHg        Initial Exercise Prescription:     Initial Exercise Prescription - 11/13/15 1000    Date of Initial Exercise Prescription   Date 11/13/15   Treadmill   MPH 3.2   Grade 2   Minutes 10   METs 4.33   Bike   Level 1.6   Minutes 10   METs 4.7   NuStep   Level 3   Minutes 10   METs 2   Prescription Details   Frequency (times per week) 3   Duration Progress to 30 minutes of continuous aerobic without signs/symptoms of physical distress   Intensity   THRR 40-80% of Max Heartrate 67-134   Ratings of Perceived Exertion 11-13   Progression   Progression Continue to progress workloads to maintain intensity without signs/symptoms of physical distress.   Resistance Training   Training Prescription Yes   Weight 2lbs   Reps 10-12      Perform Capillary Blood Glucose checks as needed.  Exercise Prescription Changes:      Exercise Prescription Changes      11/26/15 1000 12/02/15 1200 12/09/15 1300       Exercise Review   Progression  Yes Yes     Response to Exercise   Blood Pressure (Admit)  136/78 mmHg 124/84 mmHg     Blood Pressure (Exercise)  150/82 mmHg 178/80 mmHg     Blood Pressure (Exit)  124/60 mmHg 126/70 mmHg     Heart Rate (Admit)  84 bpm 80 bpm     Heart Rate (Exercise)  148 bpm 140 bpm     Heart Rate (Exit)  88 bpm 90 bpm     Rating of Perceived Exertion (Exercise)  12 13     Symptoms  none none     Comments  Home Exercise reviewed o 11/26/15 Home Exercise reviewed o 11/26/15     Duration  Progress to 30 minutes of continuous aerobic without signs/symptoms of physical distress Progress to 30 minutes of continuous aerobic without signs/symptoms of physical distress     Intensity  THRR unchanged THRR unchanged      Progression   Progression  Continue to  progress workloads to maintain intensity without signs/symptoms of physical distress. Continue to progress workloads to maintain intensity without signs/symptoms of physical distress.     Average METs  4.4 4.8     Resistance Training   Training Prescription  Yes Yes     Weight  5 lbs 5 lbs     Reps  10-12 10-12     Interval Training   Interval Training  No No     Treadmill   MPH  3.2 3.2     Grade  3 4     Minutes  10 10     METs  4.77 5.21     Bike   Level  1.6 1.6     Minutes  10 10     METs  4.66 4.66     NuStep   Level  5 5     Minutes  10 10     METs  3.9 4.4     Home Exercise Plan   Plans to continue exercise at Home  walking and biking Home  walking and biking Home  walking and biking     Frequency Add 3 additional days to program exercise sessions. Add 3 additional days to program exercise sessions. Add 3 additional days to program exercise sessions.        Exercise Comments:      Exercise Comments      12/04/15 1432           Exercise Comments Doing well with exercise tolerance, will continue to follow exericse progression          Discharge Exercise Prescription (Final Exercise Prescription Changes):     Exercise Prescription Changes - 12/09/15 1300    Exercise Review   Progression Yes   Response to Exercise   Blood Pressure (Admit) 124/84 mmHg   Blood Pressure (Exercise) 178/80 mmHg   Blood Pressure (Exit) 126/70 mmHg   Heart Rate (Admit) 80 bpm   Heart Rate (Exercise) 140 bpm   Heart Rate (Exit) 90 bpm   Rating of Perceived Exertion (Exercise) 13   Symptoms none   Comments Home Exercise reviewed o 11/26/15   Duration Progress to 30 minutes of continuous aerobic without signs/symptoms of physical distress   Intensity THRR unchanged   Progression   Progression Continue to progress workloads to maintain intensity without signs/symptoms of physical distress.   Average METs 4.8   Resistance Training    Training Prescription Yes   Weight 5 lbs   Reps 10-12   Interval Training   Interval Training No   Treadmill   MPH 3.2   Grade 4   Minutes 10   METs 5.21   Bike   Level 1.6   Minutes 10   METs 4.66   NuStep   Level 5   Minutes 10   METs 4.4   Home Exercise Plan   Plans to continue exercise at Home  walking and biking   Frequency Add 3 additional days to program exercise sessions.      Nutrition:  Target Goals: Understanding of nutrition guidelines, daily intake of sodium 1500mg , cholesterol 200mg , calories 30% from fat and 7% or less from saturated fats, daily to have 5 or more servings of fruits and vegetables.  Biometrics:     Pre Biometrics - 11/13/15 1144    Pre Biometrics   Height 5' 7.75" (1.721 m)   Weight 180 lb 12.4 oz (82 kg)   Waist Circumference 37 inches  Hip Circumference 37 inches   Waist to Hip Ratio 1 %   BMI (Calculated) 27.7   Triceps Skinfold 6 mm   % Body Fat 22 %   Grip Strength 6 kg   Flexibility 7.5 in   Single Leg Stand 30 seconds       Nutrition Therapy Plan and Nutrition Goals:     Nutrition Therapy & Goals - 11/20/15 1446    Nutrition Therapy   Diet Diabetic, Therapeutic Lifestyle Changes   Drug/Food Interactions Coumadin/Vit K   Personal Nutrition Goals   Personal Goal #1 Wt loss of 8 lb to a goal wt of 172 lb at graduation from Cardiac Rehab   Personal Goal #2  Work to achieve tighter blood glucose control as evidenced by a decrease in Hemoglobin A1c toward a goal of less than 7  Last A1c 8.9   Intervention Plan   Intervention Prescribe, educate and counsel regarding individualized specific dietary modifications aiming towards targeted core components such as weight, hypertension, lipid management, diabetes, heart failure and other comorbidities.   Expected Outcomes Short Term Goal: Understand basic principles of dietary content, such as calories, fat, sodium, cholesterol and nutrients.;Long Term Goal: Adherence to  prescribed nutrition plan.      Nutrition Discharge: Nutrition Scores:     Nutrition Assessments - 12/05/15 1019    MEDFICTS Scores   Pre Score 51  revised score after nutr discussion      Nutrition Goals Re-Evaluation:   Psychosocial: Target Goals: Acknowledge presence or absence of depression, maximize coping skills, provide positive support system. Participant is able to verbalize types and ability to use techniques and skills needed for reducing stress and depression.  Initial Review & Psychosocial Screening:     Initial Psych Review & Screening - 11/24/15 1012    Family Dynamics   Good Support System? Yes   Barriers   Psychosocial barriers to participate in program There are no identifiable barriers or psychosocial needs.   Screening Interventions   Interventions Encouraged to exercise      Quality of Life Scores:     Quality of Life - 11/13/15 1158    Quality of Life Scores   Health/Function Pre 24.67 %   Socioeconomic Pre 25.21 %   Psych/Spiritual Pre 28.29 %   Family Pre 27.6 %   GLOBAL Pre 25.96 %      PHQ-9:     Recent Review Flowsheet Data    Depression screen [facility] 2/9 11/18/2015   Decreased Interest 0   Down, Depressed, Hopeless 0   PHQ - 2 Score 0      Psychosocial Evaluation and Intervention:   Psychosocial Re-Evaluation:     Psychosocial Re-Evaluation      12/10/15 1444           Psychosocial Re-Evaluation   Interventions Encouraged to attend Cardiac Rehabilitation for the exercise       Continued Psychosocial Services Needed No          Vocational Rehabilitation: Provide vocational rehab assistance to qualifying candidates.   Vocational Rehab Evaluation & Intervention:     Vocational Rehab - 11/13/15 1102    Initial Vocational Rehab Evaluation & Intervention   Assessment shows need for Vocational Rehabilitation No      Education: Education Goals: Education classes will be provided on a weekly basis, covering required  topics. Participant will state understanding/return demonstration of topics presented.  Learning Barriers/Preferences:     Learning Barriers/Preferences - 11/12/15 1531    Learning  Barriers/Preferences   Learning Barriers Sight   Learning Preferences Written Material;Audio;Verbal Instruction;Individual Instruction      Education Topics: Count Your Pulse:  -Group instruction provided by verbal instruction, demonstration, patient participation and written materials to support subject.  Instructors address importance of being able to find your pulse and how to count your pulse when at home without a heart monitor.  Patients get hands on experience counting their pulse with staff help and individually.   Heart Attack, Angina, and Risk Factor Modification:  -Group instruction provided by verbal instruction, video, and written materials to support subject.  Instructors address signs and symptoms of angina and heart attacks.    Also discuss risk factors for heart disease and how to make changes to improve heart health risk factors.      CARDIAC REHAB PHASE II EXERCISE from 12/05/2015 in [facility] CARDIAC REHAB   Date  11/26/15   Instruction Review Code  2- meets goals/outcomes      Functional Fitness:  -Group instruction provided by verbal instruction, demonstration, patient participation, and written materials to support subject.  Instructors address safety measures for doing things around the house.  Discuss how to get up and down off the floor, how to pick things up properly, how to safely get out of a chair without assistance, and balance training.          CARDIAC REHAB PHASE II EXERCISE from 12/05/2015 in [facility] CARDIAC REHAB   Date  12/05/15   Educator  Fabio Pierce, MA  ACSM RCEP   Instruction Review Code  2- meets goals/outcomes      Meditation and Mindfulness:  -Group instruction provided by verbal instruction, patient participation,  and written materials to support subject.  Instructor addresses importance of mindfulness and meditation practice to help reduce stress and improve awareness.  Instructor also leads participants through a meditation exercise.    Stretching for Flexibility and Mobility:  -Group instruction provided by verbal instruction, patient participation, and written materials to support subject.  Instructors lead participants through series of stretches that are designed to increase flexibility thus improving mobility.  These stretches are additional exercise for major muscle groups that are typically performed during regular warm up and cool down.   Hands Only CPR Anytime:  -Group instruction provided by verbal instruction, video, patient participation and written materials to support subject.  Instructors co-teach with AHA video for hands only CPR.  Participants get hands on experience with mannequins.   Nutrition I class: Heart Healthy Eating:  -Group instruction provided by PowerPoint slides, verbal discussion, and written materials to support subject matter. The instructor gives an explanation and review of the Therapeutic Lifestyle Changes diet recommendations, which includes a discussion on lipid goals, dietary fat, sodium, fiber, plant stanol/sterol esters, sugar, and the components of a well-balanced, healthy diet.   Nutrition II class: Lifestyle Skills:  -Group instruction provided by PowerPoint slides, verbal discussion, and written materials to support subject matter. The instructor gives an explanation and review of label reading, grocery shopping for heart health, heart healthy recipe modifications, and ways to make healthier choices when eating out.   Diabetes Question & Answer:  -Group instruction provided by PowerPoint slides, verbal discussion, and written materials to support subject matter. The instructor gives an explanation and review of diabetes co-morbidities, pre- and post-prandial  blood glucose goals, pre-exercise blood glucose goals, signs, symptoms, and treatment of hypoglycemia and hyperglycemia, and foot care basics.   Diabetes Blitz:  -Group  instruction provided by PowerPoint slides, verbal discussion, and written materials to support subject matter. The instructor gives an explanation and review of the physiology behind type 1 and type 2 diabetes, diabetes medications and rational behind using different medications, pre- and post-prandial blood glucose recommendations and Hemoglobin A1c goals, diabetes diet, and exercise including blood glucose guidelines for exercising safely.    Portion Distortion:  -Group instruction provided by PowerPoint slides, verbal discussion, written materials, and food models to support subject matter. The instructor gives an explanation of serving size versus portion size, changes in portions sizes over the last 20 years, and what consists of a serving from each food group.   Stress Management:  -Group instruction provided by verbal instruction, video, and written materials to support subject matter.  Instructors review role of stress in heart disease and how to cope with stress positively.     Exercising on Your Own:  -Group instruction provided by verbal instruction, power point, and written materials to support subject.  Instructors discuss benefits of exercise, components of exercise, frequency and intensity of exercise, and end points for exercise.  Also discuss use of nitroglycerin and activating EMS.  Review options of places to exercise outside of rehab.  Review guidelines for sex with heart disease.   Cardiac Drugs I:  -Group instruction provided by verbal instruction and written materials to support subject.  Instructor reviews cardiac drug classes: antiplatelets, anticoagulants, beta blockers, and statins.  Instructor discusses reasons, side effects, and lifestyle considerations for each drug class.   Cardiac Drugs II:   -Group instruction provided by verbal instruction and written materials to support subject.  Instructor reviews cardiac drug classes: angiotensin converting enzyme inhibitors (ACE-I), angiotensin II receptor blockers (ARBs), nitrates, and calcium channel blockers.  Instructor discusses reasons, side effects, and lifestyle considerations for each drug class.   Anatomy and Physiology of the Circulatory System:  -Group instruction provided by verbal instruction, video, and written materials to support subject.  Reviews functional anatomy of heart, how it relates to various diagnoses, and what role the heart plays in the overall system.   Knowledge Questionnaire Score:     Knowledge Questionnaire Score - 11/19/15 1132    Knowledge Questionnaire Score   Pre Score 22/24          DM 12/15      Core Components/Risk Factors/Patient Goals at Admission:     Personal Goals and Risk Factors at Admission - 11/13/15 0840    Core Components/Risk Factors/Patient Goals on Admission    Weight Management Yes   Intervention Weight Management: Develop a combined nutrition and exercise program designed to reach desired caloric intake, while maintaining appropriate intake of nutrient and fiber, sodium and fats, and appropriate energy expenditure required for the weight goal.;Weight Management: Provide education and appropriate resources to help participant work on and attain dietary goals.;Weight Management/Obesity: Establish reasonable short term and long term weight goals.   Admit Weight 180 lb 12.4 oz (82 kg)   Goal Weight: Short Term 175 lb (79.379 kg)   Goal Weight: Long Term 172 lb (78.019 kg)   Expected Outcomes Short Term: Continue to assess and modify interventions until short term weight is achieved.;Long Term: Adherence to nutrition and physical activity/exercise program aimed toward attainment of established weight goal.   Sedentary --  Improve stamina overall   Intervention Provide advice,  education, support and counseling about physical activity/exercise needs.;Develop an individualized exercise prescription for aerobic and resistive training based on initial evaluation findings, risk stratification, comorbidities  and participant's personal goals.   Expected Outcomes Achievement of increased cardiorespiratory fitness and enhanced flexibility, muscular endurance and strength shown through measurements of functional capaciy and personal statement of participant.   Diabetes Yes   Intervention Provide education about signs/symptoms and action to take for hypo/hyperglycemia.;Provide education about proper nutrition, including hydration, and aerobic/resistive exercise prescription along with prescribed medications to achieve blood glucose in normal ranges: Fasting glucose 65-99 mg/dL   Expected Outcomes Short Term: Participant verbalizes understanding of the signs/symptoms and immediate care of hyper/hypoglycemia, proper foot care and importance of medication, aerobic/resistive exercise and nutrition plan for blood glucose control.;Long Term: Attainment of HbA1C < 7%.   Hypertension Yes   Intervention Provide education on lifestyle modifcations including regular physical activity/exercise, weight management, moderate sodium restriction and increased consumption of fresh fruit, vegetables, and low fat dairy, alcohol moderation, and smoking cessation.;Monitor prescription use compliance.   Expected Outcomes Short Term: Continued assessment and intervention until BP is < 140/54mm HG in hypertensive participants. < 130/73mm HG in hypertensive participants with diabetes, heart failure or chronic kidney disease.;Long Term: Maintenance of blood pressure at goal levels.   Lipids Yes   Intervention Provide education and support for participant on nutrition & aerobic/resistive exercise along with prescribed medications to achieve LDL 70mg , HDL >40mg .   Expected Outcomes Short Term: Participant states  understanding of desired cholesterol values and is compliant with medications prescribed. Participant is following exercise prescription and nutrition guidelines.;Long Term: Cholesterol controlled with medications as prescribed, with individualized exercise RX and with personalized nutrition plan. Value goals: LDL < , HDL > 40 mg.   Stress Yes   Intervention Offer individual and/or small group education and counseling on adjustment to heart disease, stress management and health-related lifestyle change. Teach and support self-help strategies.   Expected Outcomes Short Term: Participant demonstrates changes in health-related behavior, relaxation and other stress management skills, ability to obtain effective social support, and compliance with psychotropic medications if prescribed.;Long Term: Emotional wellbeing is indicated by absence of clinically significant psychosocial distress or social isolation.   Personal Goal Other Yes   Personal Goal Learn limits for exercise   Intervention Adhere to exercise prescription for program   Expected Outcomes Know how to exercise independently      Core Components/Risk Factors/Patient Goals Review:      Goals and Risk Factor Review      12/03/15 1014           Core Components/Risk Factors/Patient Goals Review   Personal Goals Review Weight Management/Obesity;Sedentary;Diabetes;Hypertension;Lipids;Other       Review Pt's weight is down about a kilogram from start.  He is coming to rehab 3x week and plans to start his home exercise this week.  His blood sugars have stabilized and blood pressures are better overall.  He is not having any problems with his statins.  Since he has been in the program, he is learning what his limits are for exericse.       Expected Outcomes Continue to improve across the board.          Core Components/Risk Factors/Patient Goals at Discharge (Final Review):      Goals and Risk Factor Review - 12/03/15 1014    Core  Components/Risk Factors/Patient Goals Review   Personal Goals Review Weight Management/Obesity;Sedentary;Diabetes;Hypertension;Lipids;Other   Review Pt's weight is down about a kilogram from start.  He is coming to rehab 3x week and plans to start his home exercise this week.  His blood sugars have stabilized and  blood pressures are better overall.  He is not having any problems with his statins.  Since he has been in the program, he is learning what his limits are for exericse.   Expected Outcomes Continue to improve across the board.      ITP Comments:     ITP Comments      11/13/15 0806 11/13/15 1040 11/17/15 1041       ITP Comments Medical Director is Dr. Armanda Magic, MD Medical Director is Dr. Armanda Magic, MD Medical Director is Dr. Armanda Magic, MD        Comments: 60 DAY ITP REVIEW Pt is making expected progress toward personal goals after completing 11sessions.   Recommend continued exercise and life style modification education including  stress management and relaxation techniques to decrease cardiac risk profile.

## 2015-12-12 ENCOUNTER — Encounter: Payer: BC Managed Care – PPO | Admitting: Pharmacist Clinician (PhC)/ Clinical Pharmacy Specialist

## 2015-12-12 ENCOUNTER — Encounter (HOSPITAL_COMMUNITY)
Admission: RE | Admit: 2015-12-12 | Discharge: 2015-12-12 | Disposition: A | Discharge status: Home / Self Care | Payer: BC Managed Care – PPO | Source: Ambulatory Visit | Attending: Cardiology | Admitting: Cardiology

## 2015-12-12 ENCOUNTER — Ambulatory Visit (INDEPENDENT_AMBULATORY_CARE_PROVIDER_SITE_OTHER): Payer: BC Managed Care – PPO | Admitting: Pharmacist Clinician (PhC)/ Clinical Pharmacy Specialist

## 2015-12-12 DIAGNOSIS — Z952 Presence of prosthetic heart valve: Secondary | ICD-10-CM

## 2015-12-12 DIAGNOSIS — Z7901 Long term (current) use of anticoagulants: Secondary | ICD-10-CM | POA: Diagnosis not present

## 2015-12-12 DIAGNOSIS — Z954 Presence of other heart-valve replacement: Secondary | ICD-10-CM | POA: Diagnosis not present

## 2015-12-12 LAB — POCT INR: INR: 1.9

## 2015-12-15 ENCOUNTER — Encounter (HOSPITAL_COMMUNITY): Payer: BC Managed Care – PPO

## 2015-12-17 ENCOUNTER — Encounter (HOSPITAL_COMMUNITY): Payer: BC Managed Care – PPO

## 2015-12-19 ENCOUNTER — Encounter (HOSPITAL_COMMUNITY)
Admission: RE | Admit: 2015-12-19 | Discharge: 2015-12-19 | Disposition: A | Discharge status: Home / Self Care | Payer: BC Managed Care – PPO | Source: Ambulatory Visit | Attending: Cardiology | Admitting: Cardiology

## 2015-12-19 ENCOUNTER — Telehealth (HOSPITAL_COMMUNITY): Payer: Self-pay | Admitting: *Deleted

## 2015-12-19 DIAGNOSIS — Z952 Presence of prosthetic heart valve: Secondary | ICD-10-CM

## 2015-12-19 DIAGNOSIS — Z954 Presence of other heart-valve replacement: Secondary | ICD-10-CM | POA: Insufficient documentation

## 2015-12-22 ENCOUNTER — Encounter (HOSPITAL_COMMUNITY): Payer: BC Managed Care – PPO

## 2015-12-24 ENCOUNTER — Encounter (HOSPITAL_COMMUNITY): Payer: BC Managed Care – PPO

## 2015-12-26 ENCOUNTER — Encounter (HOSPITAL_COMMUNITY): Payer: BC Managed Care – PPO

## 2015-12-26 ENCOUNTER — Ambulatory Visit (INDEPENDENT_AMBULATORY_CARE_PROVIDER_SITE_OTHER): Payer: BC Managed Care – PPO | Admitting: Pharmacist Clinician (PhC)/ Clinical Pharmacy Specialist

## 2015-12-26 DIAGNOSIS — Z954 Presence of other heart-valve replacement: Secondary | ICD-10-CM

## 2015-12-26 DIAGNOSIS — Z7901 Long term (current) use of anticoagulants: Secondary | ICD-10-CM | POA: Diagnosis not present

## 2015-12-26 DIAGNOSIS — Z952 Presence of prosthetic heart valve: Secondary | ICD-10-CM

## 2015-12-26 LAB — POCT INR: INR: 3.6

## 2015-12-29 ENCOUNTER — Encounter (HOSPITAL_COMMUNITY)
Admission: RE | Admit: 2015-12-29 | Discharge: 2015-12-29 | Disposition: A | Discharge status: Home / Self Care | Payer: BC Managed Care – PPO | Source: Ambulatory Visit | Attending: Cardiology | Admitting: Cardiology

## 2015-12-29 DIAGNOSIS — Z952 Presence of prosthetic heart valve: Secondary | ICD-10-CM

## 2015-12-29 DIAGNOSIS — Z954 Presence of other heart-valve replacement: Secondary | ICD-10-CM | POA: Diagnosis not present

## 2015-12-31 ENCOUNTER — Encounter (HOSPITAL_COMMUNITY)
Admission: RE | Admit: 2015-12-31 | Discharge: 2015-12-31 | Disposition: A | Discharge status: Home / Self Care | Payer: BC Managed Care – PPO | Source: Ambulatory Visit | Attending: Cardiology | Admitting: Cardiology

## 2015-12-31 DIAGNOSIS — Z952 Presence of prosthetic heart valve: Secondary | ICD-10-CM

## 2015-12-31 DIAGNOSIS — Z954 Presence of other heart-valve replacement: Secondary | ICD-10-CM | POA: Diagnosis not present

## 2016-01-02 ENCOUNTER — Encounter (HOSPITAL_COMMUNITY)
Admission: RE | Admit: 2016-01-02 | Discharge: 2016-01-02 | Disposition: A | Discharge status: Home / Self Care | Payer: BC Managed Care – PPO | Source: Ambulatory Visit | Attending: Cardiology | Admitting: Cardiology

## 2016-01-02 DIAGNOSIS — Z954 Presence of other heart-valve replacement: Secondary | ICD-10-CM | POA: Diagnosis not present

## 2016-01-02 DIAGNOSIS — Z952 Presence of prosthetic heart valve: Secondary | ICD-10-CM

## 2016-01-02 NOTE — Progress Notes (Signed)
Cardiac Individual Treatment Plan  Patient Details  Name: Dennis Bates MRN: [id number] Date of Birth: 1960-09-18 Referring Provider:        CARDIAC REHAB PHASE II EXERCISE from 12/10/2015 in [facility] CARDIAC [facility]   Referring Provider  Bryan Lemma MD      Initial Encounter Date:       CARDIAC REHAB PHASE II EXERCISE from 12/10/2015 in [facility] CARDIAC REHAB   Date  11/13/15   Referring Provider  Bryan Lemma MD      Visit Diagnosis: S/P AVR  Patient's Home Medications on Admission:  Current outpatient prescriptions:  .  acetaminophen (TYLENOL) 325 MG tablet, Take 2 tablets (650 mg total) by mouth every 6 (six) hours as needed for mild pain., Disp: , Rfl:  .  aspirin EC 81 MG EC tablet, Take 1 tablet (81 mg total) by mouth daily. (Patient taking differently: Take 81 mg by mouth every morning. ), Disp: , Rfl:  .  atorvastatin (LIPITOR) 80 MG tablet, Take 0.5 tablets (40 mg total) by mouth at bedtime., Disp: 30 tablet, Rfl: 6 .  carvedilol (COREG) 3.125 MG tablet, Take 1 tablet (3.125 mg total) by mouth 2 (two) times daily with a meal., Disp: 60 tablet, Rfl: 1 .  fenofibrate 160 MG tablet, Take 1 tablet (160 mg total) by mouth daily., Disp: 30 tablet, Rfl: 1 .  glipiZIDE (GLUCOTROL XL) 5 MG 24 hr tablet, Take 1 tablet (5 mg total) by mouth daily with breakfast., Disp: 30 tablet, Rfl: 1 .  metFORMIN (GLUCOPHAGE) 1000 MG tablet, Take 1,000 mg by mouth 2 (two) times daily with a meal. , Disp: , Rfl:  .  warfarin (COUMADIN) 5 MG tablet, Take 1.5 to 2 tablets by mouth daily or as directed by coumadin clinic, Disp: 180 tablet, Rfl: 1  Past Medical History: Past Medical History  Diagnosis Date  . Hyperlipidemia   . Bell's palsy   . Aortic stenosis due to bicuspid aortic valve     Probable bicuspid aortic valve: a. mod-sev by 2D ECHO 09/2014. AVA 0.9cm^2; b) 10/2014 Echo:. Severe AS Mn-Pk Gradient 41-71 mmHg, AVA ~0.79 cm2, EF 60-65%  .  Hypertriglyceridemia   . CAD S/P percutaneous coronary angioplasty 09/20/2014    a. inferolat STEMI ->> LHC-Angio: Proximal/ostial LAD 20%, OM2 99% -->> PCI: 3mm x 16 mm Promus Premier DES to the OM2.(~3.5 mm)  . ST elevation myocardial infarction (STEMI) of inferior wall (HCC) 09/20/2014  . Hypertension   . Heart murmur   . Shortness of breath dyspnea   . Diabetes mellitus type 2, controlled, with complications (HCC)     Tobacco Use: History  Smoking status  . Never Smoker   Smokeless tobacco  . Not on file    Labs:     Recent Review Flowsheet Data    Labs for ITP Cardiac and Pulmonary Rehab Latest Ref Rng 10/09/2015 10/09/2015 10/09/2015 10/09/2015 10/10/2015   PHART 7.350 - 7.450 7.324(L) 7.316(L) - 7.339(L) -   PCO2ART 35.0 - 45.0 mmHg 43.8 40.9 - 43.1 -   HCO3 20.0 - 24.0 mEq/L 22.8 21.0 - 23.4 -   TCO2 0 - 100 mmol/L 24 22 23 25 21    ACIDBASEDEF 0.0 - 2.0 mmol/L 3.0(H) 5.0(H) - 3.0(H) -   O2SAT - 94.0 98.0 - 98.0 -      Capillary Blood Glucose: Lab Results  Component Value Date   GLUCAP 182* 12/05/2015   GLUCAP 220* 11/28/2015   GLUCAP 218* 11/26/2015  GLUCAP 121* 11/24/2015   GLUCAP 180* 11/24/2015     Exercise Target Goals:    Exercise Program Goal: Individual exercise prescription set with THRR, safety & activity barriers. Participant demonstrates ability to understand and report RPE using BORG scale, to self-measure pulse accurately, and to acknowledge the importance of the exercise prescription.  Exercise Prescription Goal: Starting with aerobic activity 30 plus minutes a day, 3 days per week for initial exercise prescription. Provide home exercise prescription and guidelines that participant acknowledges understanding prior to discharge.  Activity Barriers & Risk Stratification:     Activity Barriers & Cardiac Risk Stratification - 11/13/15 0839    Activity Barriers & Cardiac Risk Stratification   Activity Barriers Muscular Weakness;Incisional Pain    Comments knee pain surgery x4   Cardiac Risk Stratification High      6 Minute Walk:     6 Minute Walk      11/13/15 1041       6 Minute Walk   Phase Initial     Distance 1800 feet     Walk Time 6 minutes     # of Rest Breaks 0     MPH 3.41     METS 4.73     RPE 11     VO2 Peak 16.54     Symptoms No     Resting HR 89 bpm     Resting BP 118/68 mmHg     Max Ex. HR 106 bpm     Max Ex. BP 120/68 mmHg        Initial Exercise Prescription:     Initial Exercise Prescription - 12/25/15 1400    Date of Initial Exercise RX and Referring Provider   Date 11/13/15   Referring Provider Bryan Lemma MD   Treadmill   MPH 3.2   Grade 4   Minutes 10   METs 5.21   Bike   Level 1.6   Minutes 10   METs 4.63   NuStep   Level 5   Minutes 10   METs 4.8   Resistance Training   Training Prescription Yes   Weight 6 lbs   Reps 10-12      Perform Capillary Blood Glucose checks as needed.  Exercise Prescription Changes:      Exercise Prescription Changes      11/26/15 1000 12/02/15 1200 12/09/15 1300 12/15/15 1600 12/30/15 1000   Exercise Review   Progression  Yes Yes Yes Yes   Response to Exercise   Blood Pressure (Admit)  136/78 mmHg 124/84 mmHg 134/74 mmHg 122/64 mmHg   Blood Pressure (Exercise)  150/82 mmHg 178/80 mmHg 158/78 mmHg 136/74 mmHg   Blood Pressure (Exit)  124/60 mmHg 126/70 mmHg 118/60 mmHg 108/80 mmHg   Heart Rate (Admit)  84 bpm 80 bpm 89 bpm 88 bpm   Heart Rate (Exercise)  148 bpm 140 bpm 140 bpm 139 bpm   Heart Rate (Exit)  88 bpm 90 bpm 94 bpm 92 bpm   Rating of Perceived Exertion (Exercise)  Symptoms  none none none none   Comments  Home Exercise reviewed o 11/26/15 Home Exercise reviewed o 11/26/15 Home Exercise reviewed o 11/26/15 Home Exercise reviewed o 11/26/15   Duration  Progress to 30 minutes of continuous aerobic without signs/symptoms of physical distress Progress to 30 minutes of continuous aerobic without signs/symptoms of  physical distress Progress to 30 minutes of continuous aerobic without signs/symptoms of physical distress Progress to 30 minutes  of continuous aerobic without signs/symptoms of physical distress   Intensity  THRR unchanged THRR unchanged THRR unchanged THRR unchanged   Progression   Progression  Continue to progress workloads to maintain intensity without signs/symptoms of physical distress. Continue to progress workloads to maintain intensity without signs/symptoms of physical distress. Continue to progress workloads to maintain intensity without signs/symptoms of physical distress. Continue to progress workloads to maintain intensity without signs/symptoms of physical distress.   Average METs  4.4 4.8 4.9 4.9   Resistance Training   Training Prescription  Yes Yes Yes Yes   Weight  5 lbs 5 lbs 6 lbs 6 lbs   Reps  10-12 10-12 10-12 10-12   Interval Training   Interval Training  No No No No   Treadmill   MPH  3.2 3.2 3.2 3.2   Grade  3 4 4 4    Minutes  10 10 10 10    METs  4.77 5.21 5.21 5.21   Bike   Level  1.6 1.6 1.6 1.6   Minutes  10 10 10 10    METs  4.66 4.66 4.63 4.63   NuStep   Level  5 5 5 5    Minutes  10 10 10 10    METs  3.9 4.4 4.8 4.8   Home Exercise Plan   Plans to continue exercise at Home  walking and biking Home  walking and biking Home  walking and biking Home  walking and biking Home  walking and biking   Frequency Add 3 additional days to program exercise sessions. Add 3 additional days to program exercise sessions. Add 3 additional days to program exercise sessions. Add 3 additional days to program exercise sessions. Add 3 additional days to program exercise sessions.      Exercise Comments:      Exercise Comments      12/04/15 1432 12/30/15 1040 12/31/15 1040       Exercise Comments Doing well with exercise tolerance, will continue to follow exericse progression Pt is tolerating exercise well and continues to make progress. Doing well with exercise  tolerance, will continue to follow exericse progression.  He continues to have some issue with exertional blood pressures, once improved start HIIT.        Discharge Exercise Prescription (Final Exercise Prescription Changes):     Exercise Prescription Changes - 12/30/15 1000    Exercise Review   Progression Yes   Response to Exercise   Blood Pressure (Admit) 122/64 mmHg   Blood Pressure (Exercise) 136/74 mmHg   Blood Pressure (Exit) 108/80 mmHg   Heart Rate (Admit) 88 bpm   Heart Rate (Exercise) 139 bpm   Heart Rate (Exit) 92 bpm   Rating of Perceived Exertion (Exercise) 12   Symptoms none   Comments Home Exercise reviewed o 11/26/15   Duration Progress to 30 minutes of continuous aerobic without signs/symptoms of physical distress   Intensity THRR unchanged   Progression   Progression Continue to progress workloads to maintain intensity without signs/symptoms of physical distress.   Average METs 4.9   Resistance Training   Training Prescription Yes   Weight 6 lbs   Reps 10-12   Interval Training   Interval Training No   Treadmill   MPH 3.2   Grade 4   Minutes 10   METs 5.21   Bike   Level 1.6   Minutes 10   METs 4.63   NuStep   Level 5   Minutes 10   METs 4.8  Home Exercise Plan   Plans to continue exercise at Home  walking and biking   Frequency Add 3 additional days to program exercise sessions.      Nutrition:  Target Goals: Understanding of nutrition guidelines, daily intake of sodium 1500mg , cholesterol 200mg , calories 30% from fat and 7% or less from saturated fats, daily to have 5 or more servings of fruits and vegetables.  Biometrics:     Pre Biometrics - 11/13/15 1144    Pre Biometrics   Height 5' 7.75" (1.721 m)   Weight 180 lb 12.4 oz (82 kg)   Waist Circumference 37 inches   Hip Circumference 37 inches   Waist to Hip Ratio 1 %   BMI (Calculated) 27.7   Triceps Skinfold 6 mm   % Body Fat 22 %   Grip Strength 6 kg   Flexibility 7.5 in    Single Leg Stand 30 seconds       Nutrition Therapy Plan and Nutrition Goals:     Nutrition Therapy & Goals - 11/20/15 1446    Nutrition Therapy   Diet Diabetic, Therapeutic Lifestyle Changes   Drug/Food Interactions Coumadin/Vit K   Personal Nutrition Goals   Personal Goal #1 Wt loss of 8 lb to a goal wt of 172 lb at graduation from Cardiac Rehab   Personal Goal #2  Work to achieve tighter blood glucose control as evidenced by a decrease in Hemoglobin A1c toward a goal of less than 7  Last A1c 8.9   Intervention Plan   Intervention Prescribe, educate and counsel regarding individualized specific dietary modifications aiming towards targeted core components such as weight, hypertension, lipid management, diabetes, heart failure and other comorbidities.   Expected Outcomes Short Term Goal: Understand basic principles of dietary content, such as calories, fat, sodium, cholesterol and nutrients.;Long Term Goal: Adherence to prescribed nutrition plan.      Nutrition Discharge: Nutrition Scores:     Nutrition Assessments - 12/05/15 1019    MEDFICTS Scores   Pre Score 51  revised score after nutr discussion      Nutrition Goals Re-Evaluation:   Psychosocial: Target Goals: Acknowledge presence or absence of depression, maximize coping skills, provide positive support system. Participant is able to verbalize types and ability to use techniques and skills needed for reducing stress and depression.  Initial Review & Psychosocial Screening:     Initial Psych Review & Screening - 11/24/15 1012    Family Dynamics   Good Support System? Yes   Barriers   Psychosocial barriers to participate in program There are no identifiable barriers or psychosocial needs.   Screening Interventions   Interventions Encouraged to exercise      Quality of Life Scores:     Quality of Life - 11/13/15 1158    Quality of Life Scores   Health/Function Pre 24.67 %   Socioeconomic Pre 25.21 %    Psych/Spiritual Pre 28.29 %   Family Pre 27.6 %   GLOBAL Pre 25.96 %      PHQ-9:     Recent Review Flowsheet Data    Depression screen [facility] 2/9 11/18/2015   Decreased Interest 0   Down, Depressed, Hopeless 0   PHQ - 2 Score 0      Psychosocial Evaluation and Intervention:   Psychosocial Re-Evaluation:     Psychosocial Re-Evaluation      12/10/15 1444 01/02/16 1040         Psychosocial Re-Evaluation   Interventions Encouraged to attend Cardiac Rehabilitation for the  exercise Encouraged to attend Cardiac Rehabilitation for the exercise      Continued Psychosocial Services Needed No No         Vocational Rehabilitation: Provide vocational rehab assistance to qualifying candidates.   Vocational Rehab Evaluation & Intervention:     Vocational Rehab - 11/13/15 1102    Initial Vocational Rehab Evaluation & Intervention   Assessment shows need for Vocational Rehabilitation No      Education: Education Goals: Education classes will be provided on a weekly basis, covering required topics. Participant will state understanding/return demonstration of topics presented.  Learning Barriers/Preferences:     Learning Barriers/Preferences - 11/12/15 1531    Learning Barriers/Preferences   Learning Barriers Sight   Learning Preferences Written Material;Audio;Verbal Instruction;Individual Instruction      Education Topics: Count Your Pulse:  -Group instruction provided by verbal instruction, demonstration, patient participation and written materials to support subject.  Instructors address importance of being able to find your pulse and how to count your pulse when at home without a heart monitor.  Patients get hands on experience counting their pulse with staff help and individually.      CARDIAC REHAB PHASE [facility]   Date  12/19/15   Instruction Review Code  2- meets goals/outcomes      Heart Attack,  Angina, and Risk Factor Modification:  -Group instruction provided by verbal instruction, video, and written materials to support subject.  Instructors address signs and symptoms of angina and heart attacks.    Also discuss risk factors for heart disease and how to make changes to improve heart health risk factors.      [facility]ARD[facility] RE[facility]AB [facility]   Date  11/26/15   Instruction Review Code  2- meets goals/outcomes      Functional Fitness:  -Group instruction provided by verbal instruction, demonstration, patient participation, and written materials to support subject.  Instructors address safety measures for doing things around the house.  Discuss how to get up and down off the floor, how to pick things up properly, how to safely get out of a chair without assistance, and balance training.      CARDIAC REHAB PHASE [facility] EXERC[facility]   Date  12/05/15   Educator  Fabio PierceJessica Hawkins, MA  ACSM RCEP   Instruction Review Code  2- meets goals/outcomes      Meditation and Mindfulness:  -Group instruction provided by verbal instruction, patient participation, and written materials to support subject.  Instructor addresses importance of mindfulness and meditation practice to help reduce stress and improve awareness.  Instructor also leads participants through a meditation exercise.    Stretching for Flexibility and Mobility:  -Group instruction provided by verbal instruction, patient participation, and written materials to support subject.  Instructors lead participants through series of stretches that are designed to increase flexibility thus improving mobility.  These stretches are additional exercise for major muscle groups that are typically performed during regular warm up and cool down.   Hands Only CPR Anytime:  -Group instruction provided by verbal instruction, video, patient  participation and written materials to support subject.  Instructors co-teach with AHA video for hands only CPR.  Participants get hands on experience with mannequins.   Nutrition I class: Heart Healthy Eating:  -Group instruction provided by PowerPoint slides, verbal discussion, and written materials to support subject matter. The  instructor gives an explanation and review of the Therapeutic Lifestyle Changes diet recommendations, which includes a discussion on lipid goals, dietary fat, sodium, fiber, plant stanol/sterol esters, sugar, and the components of a well-balanced, healthy diet.   Nutrition II class: Lifestyle Skills:  -Group instruction provided by PowerPoint slides, verbal discussion, and written materials to support subject matter. The instructor gives an explanation and review of label reading, grocery shopping for heart health, heart healthy recipe modifications, and ways to make healthier choices when eating out.   Diabetes Question & Answer:  -Group instruction provided by PowerPoint slides, verbal discussion, and written materials to support subject matter. The instructor gives an explanation and review of diabetes co-morbidities, pre- and post-prandial blood glucose goals, pre-exercise blood glucose goals, signs, symptoms, and treatment of hypoglycemia and hyperglycemia, and foot care basics.   Diabetes Blitz:  -Group instruction provided by PowerPoint slides, verbal discussion, and written materials to support subject matter. The instructor gives an explanation and review of the physiology behind type 1 and type 2 diabetes, diabetes medications and rational behind using different medications, pre- and post-prandial blood glucose recommendations and Hemoglobin A1c goals, diabetes diet, and exercise including blood glucose guidelines for exercising safely.    Portion Distortion:  -Group instruction provided by PowerPoint slides, verbal discussion, written materials, and food  models to support subject matter. The instructor gives an explanation of serving size versus portion size, changes in portions sizes over the last 20 years, and what consists of a serving from each food group.   Stress Management:  -Group instruction provided by verbal instruction, video, and written materials to support subject matter.  Instructors review role of stress in heart disease and how to cope with stress positively.            CARDIAC REHAB PHASE II EXERCISE from 12/31/[facility] in [facility] CARDIAC REHAB   Date  12/31/15   Instruction Review Code  2- meets goals/outcomes      Exercising on Your Own:  -Group instruction provided by verbal instruction, power point, and written materials to support subject.  Instructors discuss benefits of exercise, components of exercise, frequency and intensity of exercise, and end points for exercise.  Also discuss use of nitroglycerin and activating EMS.  Review options of places to exercise outside of rehab.  Review guidelines for sex with heart disease.   Cardiac Drugs I:  -Group instruction provided by verbal instruction and written materials to support subject.  Instructor reviews cardiac drug classes: antiplatelets, anticoagulants, beta blockers, and statins.  Instructor discusses reasons, side effects, and lifestyle considerations for each drug class.   Cardiac Drugs II:  -Group instruction provided by verbal instruction and written materials to support subject.  Instructor reviews cardiac drug classes: angiotensin converting enzyme inhibitors (ACE-I), angiotensin II receptor blockers (ARBs), nitrates, and calcium channel blockers.  Instructor discusses reasons, side effects, and lifestyle considerations for each drug class.   Anatomy and Physiology of the Circulatory System:  -Group instruction provided by verbal instruction, video, and written materials to support subject.  Reviews functional anatomy of heart, how it  relates to various diagnoses, and what role the heart plays in the overall system.   Knowledge Questionnaire Score:     Knowledge Questionnaire Score - 11/19/15 1132    Knowledge Questionnaire Score   Pre Score 22/24          DM 12/15      Core Components/Risk Factors/Patient Goals at Admission:     Personal Goals  and Risk Factors at Admission - 11/13/15 0840    Core Components/Risk Factors/Patient Goals on Admission    Weight Management Yes   Intervention Weight Management: Develop a combined nutrition and exercise program designed to reach desired caloric intake, while maintaining appropriate intake of nutrient and fiber, sodium and fats, and appropriate energy expenditure required for the weight goal.;Weight Management: Provide education and appropriate resources to help participant work on and attain dietary goals.;Weight Management/Obesity: Establish reasonable short term and long term weight goals.   Admit Weight 180 lb 12.4 oz (82 kg)   Goal Weight: Short Term 175 lb (79.379 kg)   Goal Weight: Long Term 172 lb (78.019 kg)   Expected Outcomes Short Term: Continue to assess and modify interventions until short term weight is achieved.;Long Term: Adherence to nutrition and physical activity/exercise program aimed toward attainment of established weight goal.   Sedentary --  Improve stamina overall   Intervention Provide advice, education, support and counseling about physical activity/exercise needs.;Develop an individualized exercise prescription for aerobic and resistive training based on initial evaluation findings, risk stratification, comorbidities and participant's personal goals.   Expected Outcomes Achievement of increased cardiorespiratory fitness and enhanced flexibility, muscular endurance and strength shown through measurements of functional capaciy and personal statement of participant.   Diabetes Yes   Intervention Provide education about signs/symptoms and action to  take for hypo/hyperglycemia.;Provide education about proper nutrition, including hydration, and aerobic/resistive exercise prescription along with prescribed medications to achieve blood glucose in normal ranges: Fasting glucose 65-99 mg/dL   Expected Outcomes Short Term: Participant verbalizes understanding of the signs/symptoms and immediate care of hyper/hypoglycemia, proper foot care and importance of medication, aerobic/resistive exercise and nutrition plan for blood glucose control.;Long Term: Attainment of HbA1C < 7%.   Hypertension Yes   Intervention Provide education on lifestyle modifcations including regular physical activity/exercise, weight management, moderate sodium restriction and increased consumption of fresh fruit, vegetables, and low fat dairy, alcohol moderation, and smoking cessation.;Monitor prescription use compliance.   Expected Outcomes Short Term: Continued assessment and intervention until BP is < 140/63mm HG in hypertensive participants. < 130/59mm HG in hypertensive participants with diabetes, heart failure or chronic kidney disease.;Long Term: Maintenance of blood pressure at goal levels.   Lipids Yes   Intervention Provide education and support for participant on nutrition & aerobic/resistive exercise along with prescribed medications to achieve LDL 70mg , HDL >40mg .   Expected Outcomes Short Term: Participant states understanding of desired cholesterol values and is compliant with medications prescribed. Participant is following exercise prescription and nutrition guidelines.;Long Term: Cholesterol controlled with medications as prescribed, with individualized exercise RX and with personalized nutrition plan. Value goals: LDL < 70mg , HDL > 40 mg.   Stress Yes   Intervention Offer individual and/or small group education and counseling on adjustment to heart disease, stress management and health-related lifestyle change. Teach and support self-help strategies.   Expected  Outcomes Short Term: Participant demonstrates changes in health-related behavior, relaxation and other stress management skills, ability to obtain effective social support, and compliance with psychotropic medications if prescribed.;Long Term: Emotional wellbeing is indicated by absence of clinically significant psychosocial distress or social isolation.   Personal Goal Other Yes   Personal Goal Learn limits for exercise   Intervention Adhere to exercise prescription for program   Expected Outcomes Know how to exercise independently      Core Components/Risk Factors/Patient Goals Review:      Goals and Risk Factor Review      12/03/15 1014 12/19/15 1003  12/31/15 1037       Core Components/Risk Factors/Patient Goals Review   Personal Goals Review Weight Management/Obesity;Sedentary;Diabetes;Hypertension;Lipids;Other Weight Management/Obesity;Sedentary;Diabetes;Other Weight Management/Obesity;Increase Strength and Stamina;Sedentary;Hypertension;Stress;Other     Review Pt's weight is down about a kilogram from start.  He is coming to rehab 3x week and plans to start his home exercise this week.  His blood sugars have stabilized and blood pressures are better overall.  He is not having any problems with his statins.  Since he has been in the program, he is learning what his limits are for exericse. Weight has gone up again, not been eating as well.  Has started to exercise one day a week in addition to program.  Blood sugars have improved. Pt knows his limits and stamina has improved greatly!  He is walking at home on trails at park.  His weight was up after holiday weekend.  He continues to have some elevated blood pressures.  He is planning to attend Stress education today.     Expected Outcomes Continue to improve across the board. Will start to exercise more anad watch diet Continue to be able to exercise and lose more weight.        Core Components/Risk Factors/Patient Goals at Discharge  (Final Review):      Goals and Risk Factor Review - 12/31/15 1037    Core Components/Risk Factors/Patient Goals Review   Personal Goals Review Weight Management/Obesity;Increase Strength and Stamina;Sedentary;Hypertension;Stress;Other   Review Pt knows his limits and stamina has improved greatly!  He is walking at home on trails at park.  His weight was up after holiday weekend.  He continues to have some elevated blood pressures.  He is planning to attend Stress education today.   Expected Outcomes Continue to be able to exercise and lose more weight.      ITP Comments:     ITP Comments      11/13/15 0806 11/13/15 1040 11/17/15 1041       ITP Comments Medical Director is Dr. Armanda Magic, MD Medical Director is Dr. Armanda Magic, MD Medical Director is Dr. Armanda Magic, MD        Comments: Pt is making expected progress toward personal goals after completing 7 sessions. Recommend continued exercise and life style modification education including  stress management and relaxation techniques to decrease cardiac risk profile. [name] has been doing well with exercise but has been noted to have some intermittent Blood pressure resting blood pressures have been within normal limits. Will fax exercise flow sheets to Dr. Elissa Hefty office for review.

## 2016-01-05 ENCOUNTER — Encounter (HOSPITAL_COMMUNITY)
Admission: RE | Admit: 2016-01-05 | Discharge: 2016-01-05 | Disposition: A | Discharge status: Home / Self Care | Payer: BC Managed Care – PPO | Source: Ambulatory Visit | Attending: Cardiology | Admitting: Cardiology

## 2016-01-05 DIAGNOSIS — Z952 Presence of prosthetic heart valve: Secondary | ICD-10-CM

## 2016-01-05 DIAGNOSIS — Z954 Presence of other heart-valve replacement: Secondary | ICD-10-CM | POA: Diagnosis not present

## 2016-01-07 ENCOUNTER — Encounter (HOSPITAL_COMMUNITY)
Admission: RE | Admit: 2016-01-07 | Discharge: 2016-01-07 | Disposition: A | Discharge status: Home / Self Care | Payer: BC Managed Care – PPO | Source: Ambulatory Visit | Attending: Cardiology | Admitting: Cardiology

## 2016-01-07 DIAGNOSIS — Z952 Presence of prosthetic heart valve: Secondary | ICD-10-CM

## 2016-01-07 DIAGNOSIS — Z954 Presence of other heart-valve replacement: Secondary | ICD-10-CM | POA: Diagnosis not present

## 2016-01-09 ENCOUNTER — Telehealth (HOSPITAL_COMMUNITY): Payer: Self-pay | Admitting: *Deleted

## 2016-01-09 ENCOUNTER — Encounter (HOSPITAL_COMMUNITY)
Admission: RE | Admit: 2016-01-09 | Discharge: 2016-01-09 | Disposition: A | Discharge status: Home / Self Care | Payer: BC Managed Care – PPO | Source: Ambulatory Visit | Attending: Cardiology | Admitting: Cardiology

## 2016-01-09 ENCOUNTER — Ambulatory Visit (INDEPENDENT_AMBULATORY_CARE_PROVIDER_SITE_OTHER): Payer: BC Managed Care – PPO | Admitting: Pharmacist

## 2016-01-09 DIAGNOSIS — Z954 Presence of other heart-valve replacement: Secondary | ICD-10-CM | POA: Diagnosis not present

## 2016-01-09 DIAGNOSIS — Z7901 Long term (current) use of anticoagulants: Secondary | ICD-10-CM

## 2016-01-09 DIAGNOSIS — Z952 Presence of prosthetic heart valve: Secondary | ICD-10-CM

## 2016-01-09 LAB — POCT INR: INR: 2.6

## 2016-01-09 NOTE — Telephone Encounter (Signed)
-----   Message from Marykay Lexavid W Harding, MD sent at 01/09/2016  3:23 PM EDT ----- Regarding: RE: Change in Exercise Prescription That sounds fine.  DH ----- Message -----    From: Hazle NordmannJessica N Shakiara Lukic    Sent: 01/09/2016  10:29 AM      To: Marykay Lexavid W Harding, MD Subject: Change in Exercise Prescription                Dr. Herbie BaltimoreHarding,  Pt is interested in doing high intensity interval training (HIIT) in Cardiac Rehab. They have been in program for 6 weeks and have been doing great. We would like to change their exercise prescription to include HIIT. Their current THR is 67-134 (50-80%) and we would like to increase it to 158 max (95 %) for HIIT. Their RPE levels for the HIIT would reach up to 16-17 and active rest would be 11-13. They would start at 2 min of active rest and 30 sec of high intensity and progress as tolerated.  Blood pressures have improved some. Still elevated on bike at times.  We could use HIIT on treadmill and stepper.  If you are agreeable to this change in exercise prescription please let us know.   Thanks so much for your help!   Fabio PierceJessica Aldonia Keeven, MA, ACSM RCEP

## 2016-01-12 ENCOUNTER — Encounter (HOSPITAL_COMMUNITY)
Admission: RE | Admit: 2016-01-12 | Discharge: 2016-01-12 | Disposition: A | Discharge status: Home / Self Care | Payer: BC Managed Care – PPO | Source: Ambulatory Visit | Attending: Cardiology | Admitting: Cardiology

## 2016-01-12 DIAGNOSIS — Z954 Presence of other heart-valve replacement: Secondary | ICD-10-CM | POA: Insufficient documentation

## 2016-01-12 DIAGNOSIS — Z952 Presence of prosthetic heart valve: Secondary | ICD-10-CM

## 2016-01-14 ENCOUNTER — Encounter (HOSPITAL_COMMUNITY)
Admission: RE | Admit: 2016-01-14 | Discharge: 2016-01-14 | Disposition: A | Discharge status: Home / Self Care | Payer: BC Managed Care – PPO | Source: Ambulatory Visit | Attending: Cardiology | Admitting: Cardiology

## 2016-01-14 DIAGNOSIS — Z952 Presence of prosthetic heart valve: Secondary | ICD-10-CM

## 2016-01-14 DIAGNOSIS — Z954 Presence of other heart-valve replacement: Secondary | ICD-10-CM | POA: Diagnosis not present

## 2016-01-16 ENCOUNTER — Encounter (HOSPITAL_COMMUNITY)
Admission: RE | Admit: 2016-01-16 | Discharge: 2016-01-16 | Disposition: A | Discharge status: Home / Self Care | Payer: BC Managed Care – PPO | Source: Ambulatory Visit | Attending: Cardiology | Admitting: Cardiology

## 2016-01-16 DIAGNOSIS — Z954 Presence of other heart-valve replacement: Secondary | ICD-10-CM | POA: Diagnosis not present

## 2016-01-16 DIAGNOSIS — Z952 Presence of prosthetic heart valve: Secondary | ICD-10-CM

## 2016-01-16 NOTE — Progress Notes (Signed)
Dennis Bates 55 y.o. male Nutrition Note Spoke with pt. Nutrition goals reviewed with pt. Pt wants to lose wt. Pt wt is up 5 lb since admission. Pt feels wt gain due to "eating too much ham over Easter." Pt states he goes longer than 5 hours without eating and tends to snack "at night around 8 pm." Pt is trying to take a bowl of popcorn to snack on rather than the bag of popcorn. Wt loss tips reviewed. Pt is diabetic. No updated A1c noted. Pt has not gotten a PCP yet. Pt is aware of the need to obtain a PCP. Barrier to obtaining a PCP is "none of the MD's on the [employer] plan list are in [place] or they are retired." Pt comes to [place] for work and plans on looking for a PCP in [place]. Pt checks CBG's 1 time daily. Fasting CBG's reportedly "around 120 mg/dL." Pt expressed understanding of the information reviewed. Pt aware of nutrition education classes offered.  Nutrition Diagnosis ? Food-and nutrition-related knowledge deficit related to lack of exposure to information as related to diagnosis of: ? CVD ? DM ? Overweight related to excessive energy intake as evidenced by a BMI of 27.7  Nutrition RX/ Estimated Daily Nutrition Needs for: wt loss 1450-1950 Kcal, 40-50 gm fat, 9-13 gm sat fat, 1.4-2.0 gm trans-fat, <1500 mg sodium, 175-250 gm CHO   Nutrition Intervention ? Pt's individual nutrition plan reviewed with pt. ? Pt to attend the Portion Distortion class ? Pt to attend the Diabetes Q & A class ? Pt to attend the   ? Nutrition I class                  ? Nutrition II class     ? Diabetes Blitz class ? Continue client-centered nutrition education by RD, as part of interdisciplinary care. Goal(s) ? Pt to identify and limit food sources of saturated fat, trans fat, and sodium ? Pt to identify food quantities necessary to achieve weight loss of 6-24 lb (2.7-10.9 kg) at graduation from cardiac rehab. Pt's personal goal wt is 172 lb. ? CBG concentrations in the normal range or  as close to normal as is safely possible. Monitor and Evaluate progress toward nutrition goal with team. Mickle PlumbEdna Shakai Dolley, M.Ed, RD, LDN, CDE 01/16/2016 10:50 AM

## 2016-01-19 ENCOUNTER — Encounter (HOSPITAL_COMMUNITY)
Admission: RE | Admit: 2016-01-19 | Discharge: 2016-01-19 | Disposition: A | Discharge status: Home / Self Care | Payer: BC Managed Care – PPO | Source: Ambulatory Visit | Attending: Cardiology | Admitting: Cardiology

## 2016-01-19 DIAGNOSIS — Z952 Presence of prosthetic heart valve: Secondary | ICD-10-CM

## 2016-01-19 DIAGNOSIS — Z954 Presence of other heart-valve replacement: Secondary | ICD-10-CM | POA: Diagnosis not present

## 2016-01-21 ENCOUNTER — Encounter (HOSPITAL_COMMUNITY)
Admission: RE | Admit: 2016-01-21 | Discharge: 2016-01-21 | Disposition: A | Discharge status: Home / Self Care | Payer: BC Managed Care – PPO | Source: Ambulatory Visit | Attending: Cardiology | Admitting: Cardiology

## 2016-01-21 DIAGNOSIS — Z954 Presence of other heart-valve replacement: Secondary | ICD-10-CM | POA: Diagnosis not present

## 2016-01-21 DIAGNOSIS — Z952 Presence of prosthetic heart valve: Secondary | ICD-10-CM

## 2016-01-23 ENCOUNTER — Encounter (HOSPITAL_COMMUNITY)
Admission: RE | Admit: 2016-01-23 | Discharge: 2016-01-23 | Disposition: A | Discharge status: Home / Self Care | Payer: BC Managed Care – PPO | Source: Ambulatory Visit | Attending: Cardiology | Admitting: Cardiology

## 2016-01-23 ENCOUNTER — Encounter (HOSPITAL_COMMUNITY): Payer: BC Managed Care – PPO

## 2016-01-23 DIAGNOSIS — Z954 Presence of other heart-valve replacement: Secondary | ICD-10-CM | POA: Diagnosis not present

## 2016-01-26 ENCOUNTER — Encounter (HOSPITAL_COMMUNITY)
Admission: RE | Admit: 2016-01-26 | Discharge: 2016-01-26 | Disposition: A | Discharge status: Home / Self Care | Payer: BC Managed Care – PPO | Source: Ambulatory Visit | Attending: Cardiology | Admitting: Cardiology

## 2016-01-26 DIAGNOSIS — Z952 Presence of prosthetic heart valve: Secondary | ICD-10-CM

## 2016-01-26 DIAGNOSIS — Z954 Presence of other heart-valve replacement: Secondary | ICD-10-CM | POA: Diagnosis not present

## 2016-01-26 LAB — GLUCOSE, CAPILLARY: Glucose-Capillary: 202 mg/dL — ABNORMAL HIGH (ref 65–99)

## 2016-01-26 NOTE — Progress Notes (Signed)
Daily Session Note  Patient Details  Name: Dennis Bates MRN: [id number] Date of Birth: 1960-12-19 Referring Provider:        CARDIAC REHAB PHASE II EXERCISE from 12/10/2015 in [facility]   Referring Provider  Glenetta Hew MD      Encounter Date: 01/26/2016  Check In:     Session Check In - 01/26/16 0724    Check-In   Location [facility]   Staff Present Maurice Small, RN, BSN;Amber Fair, MS, ACSM RCEP, Exercise Physiologist;Portia Rollene Rotunda, RN, BSN;Molly diVincenzo, MS, ACSM RCEP, Exercise Physiologist   Supervising physician immediately available to respond to emergencies [facility] immediately available   Physician(s) Dr. Marily Memos   Medication changes reported     No   Fall or balance concerns reported    No   Warm-up and Cool-down Performed as group-led instruction   Resistance Training Performed Yes   VAD Patient? No   Pain Assessment   Currently in Pain? No/denies   Multiple Pain Sites No      Capillary Blood Glucose: No results found for this or any previous visit (from the past 24 hour(s)).   Goals Met:  Independence with exercise equipment  Goals Unmet:  Not Applicable  Comments:  Pt in this morning for the 6:45 exercise class due to work related meeting later this morning.  Pt complained of "tummy" trouble that started this morning when he awoke.  Pt excused himself to vomit in the bathroom.  Blood glucose checked 202, bp 124/80.  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  Pt had several visitors in for the mother day cookout and thinks he may have picked up a "bug" from someone.  Pt given gingerale to drink.  Pt felt better and ok to drive himself home.  Pt will be absent on Wednesday and will return on Friday if feeling better. Maurice Small RN, BSN    Dr. Fransico Him is Medical Director for Cardiac Rehab at [facility].

## 2016-01-28 ENCOUNTER — Encounter (HOSPITAL_COMMUNITY): Payer: BC Managed Care – PPO

## 2016-01-30 ENCOUNTER — Ambulatory Visit (INDEPENDENT_AMBULATORY_CARE_PROVIDER_SITE_OTHER): Payer: BC Managed Care – PPO | Admitting: Pharmacist

## 2016-01-30 ENCOUNTER — Encounter (HOSPITAL_COMMUNITY)
Admission: RE | Admit: 2016-01-30 | Discharge: 2016-01-30 | Disposition: A | Discharge status: Home / Self Care | Payer: BC Managed Care – PPO | Source: Ambulatory Visit | Attending: Cardiology | Admitting: Cardiology

## 2016-01-30 DIAGNOSIS — Z952 Presence of prosthetic heart valve: Secondary | ICD-10-CM

## 2016-01-30 DIAGNOSIS — Z7901 Long term (current) use of anticoagulants: Secondary | ICD-10-CM

## 2016-01-30 DIAGNOSIS — Z954 Presence of other heart-valve replacement: Secondary | ICD-10-CM

## 2016-01-30 LAB — POCT INR: INR: 3.3

## 2016-02-02 ENCOUNTER — Other Ambulatory Visit: Payer: Self-pay

## 2016-02-02 ENCOUNTER — Encounter (HOSPITAL_COMMUNITY): Payer: BC Managed Care – PPO

## 2016-02-02 MED ORDER — CARVEDILOL 3.125 MG PO TABS: 3.1250 mg | ORAL_TABLET | Freq: Two times a day (BID) | ORAL | Status: DC

## 2016-02-02 MED ORDER — GLIPIZIDE ER 5 MG PO TB24: 5.0000 mg | ORAL_TABLET | Freq: Every day | ORAL | Status: DC

## 2016-02-02 MED ORDER — FENOFIBRATE 160 MG PO TABS: 160.0000 mg | ORAL_TABLET | Freq: Every day | ORAL | Status: DC

## 2016-02-04 ENCOUNTER — Encounter (HOSPITAL_COMMUNITY)
Admission: RE | Admit: 2016-02-04 | Discharge: 2016-02-04 | Disposition: A | Discharge status: Home / Self Care | Payer: BC Managed Care – PPO | Source: Ambulatory Visit | Attending: Cardiology | Admitting: Cardiology

## 2016-02-04 DIAGNOSIS — Z954 Presence of other heart-valve replacement: Secondary | ICD-10-CM | POA: Diagnosis not present

## 2016-02-04 DIAGNOSIS — Z952 Presence of prosthetic heart valve: Secondary | ICD-10-CM

## 2016-02-04 NOTE — Progress Notes (Signed)
Cardiac Individual Treatment Plan  Patient Details  Name: Dennis Bates MRN: [id number] Date of Birth: 1961/05/14 Referring Provider:        [facility]ARD[facility]   Referring Provider  Bryan LemmaHarding, David MD      Initial Encounter Date:       CARDIAC REHAB PHASE II EXERC[facility]10[facility]   Date  11/13/15   Referring Provider  Bryan LemmaHarding, David MD      Visit Diagnosis: S/P AVR  Patient's Home Medications on Admission:  Current outpatient prescriptions:  .  acetaminophen (TYLENOL) 325 MG tablet, Take 2 tablets (650 mg total) by mouth every 6 (six) hours as needed for mild pain., Disp: , Rfl:  .  aspirin EC 81 MG EC tablet, Take 1 tablet (81 mg total) by mouth daily. (Patient taking differently: Take 81 mg by mouth every morning. ), Disp: , Rfl:  .  atorvastatin (LIPITOR) 80 MG tablet, Take 0.5 tablets (40 mg total) by mouth at bedtime., Disp: 30 tablet, Rfl: 6 .  carvedilol (COREG) 3.125 MG tablet, Take 1 tablet (3.125 mg total) by mouth 2 (two) times daily with a meal., Disp: 60 tablet, Rfl: 3 .  fenofibrate 160 MG tablet, Take 1 tablet (160 mg total) by mouth daily., Disp: 30 tablet, Rfl: 9 .  glipiZIDE (GLUCOTROL XL) 5 MG 24 hr tablet, Take 1 tablet (5 mg total) by mouth daily with breakfast., Disp: 30 tablet, Rfl: 3 .  metFORMIN (GLUCOPHAGE) 1000 MG tablet, Take 1,000 mg by mouth 2 (two) times daily with a meal. , Disp: , Rfl:  .  warfarin (COUMADIN) 5 MG tablet, Take 1.5 to 2 tablets by mouth daily or as directed by coumadin clinic, Disp: 180 tablet, Rfl: 1  Past Medical History: Past Medical History  Diagnosis Date  . Hyperlipidemia   . Bell's palsy   . Aortic stenosis due to bicuspid aortic valve     Probable bicuspid aortic valve: a. mod-sev by 2D ECHO 09/2014. AVA 0.9cm^2; b) 10/2014 Echo:. Severe AS Mn-Pk Gradient 41-71 mmHg, AVA ~0.79 cm2, EF 60-65%  .  Hypertriglyceridemia   . CAD S/P percutaneous coronary angioplasty 09/20/2014    a. inferolat STEMI ->> LHC-Angio: Proximal/ostial LAD 20%, OM2 99% -->> PCI: 3mm x 16 mm Promus Premier DES to the OM2.(~3.5 mm)  . ST elevation myocardial infarction (STEMI) of inferior wall (HCC) 09/20/2014  . Hypertension   . Heart murmur   . Shortness of breath dyspnea   . Diabetes mellitus type 2, controlled, with complications (HCC)     Tobacco Use: History  Smoking status  . Never Smoker   Smokeless tobacco  . Not on file    Labs:     Recent Review Flowsheet Data    Labs for ITP Cardiac and Pulmonary Rehab Latest Ref Rng 10/09/2015 10/09/2015 10/09/2015 10/09/2015 10/10/2015   PHART 7.350 - 7.450 7.324(L) 7.316(L) - 7.339(L) -   PCO2ART 35.0 - 45.0 mmHg 43.8 40.9 - 43.1 -   HCO3 20.0 - 24.0 mEq/L 22.8 21.0 - 23.4 -   TCO2 0 - 100 mmol/L 24 22 23 25 21    ACIDBASEDEF 0.0 - 2.0 mmol/L 3.0(H) 5.0(H) - 3.0(H) -   O2SAT - 94.0 98.0 - 98.0 -      Capillary Blood Glucose: Lab Results  Component Value Date   GLUCAP 202* 01/26/2016   GLUCAP 182* 12/05/2015   GLUCAP 220* 11/28/2015  GLUCAP 218* 11/26/2015   GLUCAP 121* 11/24/2015     Exercise Target Goals:    Exercise Program Goal: Individual exercise prescription set with THRR, safety & activity barriers. Participant demonstrates ability to understand and report RPE using BORG scale, to self-measure pulse accurately, and to acknowledge the importance of the exercise prescription.  Exercise Prescription Goal: Starting with aerobic activity 30 plus minutes a day, 3 days per week for initial exercise prescription. Provide home exercise prescription and guidelines that participant acknowledges understanding prior to discharge.  Activity Barriers & Risk Stratification:     Activity Barriers & Cardiac Risk Stratification - 11/13/15 0839    Activity Barriers & Cardiac Risk Stratification   Activity Barriers Muscular Weakness;Incisional Pain    Comments knee pain surgery x4   Cardiac Risk Stratification High      6 Minute Walk:     6 Minute Walk      11/13/15 1041       6 Minute Walk   Phase Initial     Distance 1800 feet     Walk Time 6 minutes     # of Rest Breaks 0     MPH 3.41     METS 4.73     RPE 11     VO2 Peak 16.54     Symptoms No     Resting HR 89 bpm     Resting BP 118/68 mmHg     Max Ex. HR 106 bpm     Max Ex. BP 120/68 mmHg        Initial Exercise Prescription:     Initial Exercise Prescription - 12/25/15 1400    Date of Initial Exercise RX and Referring Provider   Date 11/13/15   Referring Provider Bryan Lemma MD   Treadmill   MPH 3.2   Grade 4   Minutes 10   METs 5.21   Bike   Level 1.6   Minutes 10   METs 4.63   NuStep   Level 5   Minutes 10   METs 4.8   Resistance Training   Training Prescription Yes   Weight 6 lbs   Reps 10-12      Perform Capillary Blood Glucose checks as needed.  Exercise Prescription Changes:      Exercise Prescription Changes      11/26/15 1000 12/02/15 1200 12/09/15 1300 12/15/15 1600 12/30/15 1000   Exercise Review   Progression  Yes Yes Yes Yes   Response to Exercise   Blood Pressure (Admit)  136/78 mmHg 124/84 mmHg 134/74 mmHg 122/64 mmHg   Blood Pressure (Exercise)  150/82 mmHg 178/80 mmHg 158/78 mmHg 136/74 mmHg   Blood Pressure (Exit)  124/60 mmHg 126/70 mmHg 118/60 mmHg 108/80 mmHg   Heart Rate (Admit)  84 bpm 80 bpm 89 bpm 88 bpm   Heart Rate (Exercise)  148 bpm 140 bpm 140 bpm 139 bpm   Heart Rate (Exit)  88 bpm 90 bpm 94 bpm 92 bpm   Rating of Perceived Exertion (Exercise)  12 13 12 12    Symptoms  none none none none   Comments  Home Exercise reviewed o 11/26/15 Home Exercise reviewed o 11/26/15 Home Exercise reviewed o 11/26/15 Home Exercise reviewed o 11/26/15   Duration  Progress to 30 minutes of continuous aerobic without signs/symptoms of physical distress Progress to 30 minutes of continuous aerobic without signs/symptoms of  physical distress Progress to 30 minutes of continuous aerobic without signs/symptoms of physical distress Progress to 30 minutes  of continuous aerobic without signs/symptoms of physical distress   Intensity  THRR unchanged THRR unchanged THRR unchanged THRR unchanged   Progression   Progression  Continue to progress workloads to maintain intensity without signs/symptoms of physical distress. Continue to progress workloads to maintain intensity without signs/symptoms of physical distress. Continue to progress workloads to maintain intensity without signs/symptoms of physical distress. Continue to progress workloads to maintain intensity without signs/symptoms of physical distress.   Average METs  4.4 4.8 4.9 4.9   Resistance Training   Training Prescription  Yes Yes Yes Yes   Weight  5 lbs 5 lbs 6 lbs 6 lbs   Reps  10-12 10-12 10-12 10-12   Interval Training   Interval Training  No No No No   Treadmill   MPH  3.2 3.2 3.2 3.2   Grade  3 4 4 4    Minutes  10 10 10 10    METs  4.77 5.21 5.21 5.21   Bike   Level  1.6 1.6 1.6 1.6   Minutes  10 10 10 10    METs  4.66 4.66 4.63 4.63   NuStep   Level  5 5 5 5    Minutes  10 10 10 10    METs  3.9 4.4 4.8 4.8   Home Exercise Plan   Plans to continue exercise at Home  walking and biking Home  walking and biking Home  walking and biking Home  walking and biking Home  walking and biking   Frequency Add 3 additional days to program exercise sessions. Add 3 additional days to program exercise sessions. Add 3 additional days to program exercise sessions. Add 3 additional days to program exercise sessions. Add 3 additional days to program exercise sessions.     01/13/16 0900 01/30/16 1400         Exercise Review   Progression Yes Yes      Response to Exercise   Blood Pressure (Admit) 122/70 mmHg 120/80 mmHg      Blood Pressure (Exercise) 150/80 mmHg 140/80 mmHg      Blood Pressure (Exit) 116/74 mmHg 106/70 mmHg      Heart Rate (Admit) 80 bpm 73  bpm      Heart Rate (Exercise) 134 bpm 134 bpm      Heart Rate (Exit) 73 bpm 72 bpm      Rating of Perceived Exertion (Exercise) 13 14      Symptoms none none      Comments Home Exercise reviewed o 11/26/15 Home Exercise reviewed o 11/26/15      Duration Progress to 30 minutes of continuous aerobic without signs/symptoms of physical distress Progress to 30 minutes of continuous aerobic without signs/symptoms of physical distress      Intensity THRR unchanged THRR unchanged      Progression   Progression Continue to progress workloads to maintain intensity without signs/symptoms of physical distress. Continue to progress workloads to maintain intensity without signs/symptoms of physical distress.      Average METs 4.5 6.3      Resistance Training   Training Prescription Yes Yes      Weight 8 lbs 10 lbs      Reps 10-12 10-12      Interval Training   Interval Training No No      Treadmill   MPH 3.2 3.2      Grade 5 10      Minutes 10 10      METs 5.66 7.86  Bike   Level 1.6 1.6      Minutes 10 10      METs 4.68 4.6      NuStep   Level 6 9      Minutes 10 10      METs 4.5 5.9      Home Exercise Plan   Plans to continue exercise at Home  walking and biking Home  walking and biking      Frequency Add 3 additional days to program exercise sessions. Add 3 additional days to program exercise sessions.         Exercise Comments:      Exercise Comments      12/04/15 1432 12/30/15 1040 12/31/15 1040 01/13/16 0919 01/30/16 1421   Exercise Comments Doing well with exercise tolerance, will continue to follow exericse progression Pt is tolerating exercise well and continues to make progress. Doing well with exercise tolerance, will continue to follow exericse progression.  He continues to have some issue with exertional blood pressures, once improved start HIIT. Pt's blood pressures have improved.  He was still very interested in HIIT.  He was just approved to begin and will start on  5/3. Pt is doing well with HITT. making excellent progress overall with exercise at [facility] and is walking approx. 1 hour at least once per week additionally.      Discharge Exercise Prescription (Final Exercise Prescription Changes):     Exercise Prescription Changes - 01/30/16 1400    Exercise Review   Progression Yes   Response to Exercise   Blood Pressure (Admit) 120/80 mmHg   Blood Pressure (Exercise) 140/80 mmHg   Blood Pressure (Exit) 106/70 mmHg   Heart Rate (Admit) 73 bpm   Heart Rate (Exercise) 134 bpm   Heart Rate (Exit) 72 bpm   Rating of Perceived Exertion (Exercise) 14   Symptoms none   Comments Home Exercise reviewed o 11/26/15   Duration Progress to 30 minutes of continuous aerobic without signs/symptoms of physical distress   Intensity THRR unchanged   Progression   Progression Continue to progress workloads to maintain intensity without signs/symptoms of physical distress.   Average METs 6.3   Resistance Training   Training Prescription Yes   Weight 10 lbs   Reps 10-12   Interval Training   Interval Training No   Treadmill   MPH 3.2   Grade 10   Minutes 10   METs 7.86   Bike   Level 1.6   Minutes 10   METs 4.6   NuStep   Level 9   Minutes 10   METs 5.9   Home Exercise Plan   Plans to continue exercise at Home  walking and biking   Frequency Add 3 additional days to program exercise sessions.      Nutrition:  Target Goals: Understanding of nutrition guidelines, daily intake of sodium 1500mg , cholesterol 200mg , calories 30% from fat and 7% or less from saturated fats, daily to have 5 or more servings of fruits and vegetables.  Biometrics:     Pre Biometrics - 11/13/15 1144    Pre Biometrics   Height 5' 7.75" (1.721 m)   Weight 180 lb 12.4 oz (82 kg)   Waist Circumference 37 inches   Hip Circumference 37 inches   Waist to Hip Ratio 1 %   BMI (Calculated) 27.7   Triceps Skinfold 6 mm   % Body Fat 22 %   Grip Strength 6 kg   Flexibility 7.5  in  Single Leg Stand 30 seconds       Nutrition Therapy Plan and Nutrition Goals:     Nutrition Therapy & Goals - 11/20/15 1446    Nutrition Therapy   Diet Diabetic, Therapeutic Lifestyle Changes   Drug/Food Interactions Coumadin/Vit K   Personal Nutrition Goals   Personal Goal #1 Wt loss of 8 lb to a goal wt of 172 lb at graduation from Cardiac Rehab   Personal Goal #2  Work to achieve tighter blood glucose control as evidenced by a decrease in Hemoglobin A1c toward a goal of less than 7  Last A1c 8.9   Intervention Plan   Intervention Prescribe, educate and counsel regarding individualized specific dietary modifications aiming towards targeted core components such as weight, hypertension, lipid management, diabetes, heart failure and other comorbidities.   Expected Outcomes Short Term Goal: Understand basic principles of dietary content, such as calories, fat, sodium, cholesterol and nutrients.;Long Term Goal: Adherence to prescribed nutrition plan.      Nutrition Discharge: Nutrition Scores:     Nutrition Assessments - 12/05/15 1019    MEDFICTS Scores   Pre Score 51  revised score after nutr discussion      Nutrition Goals Re-Evaluation:     Nutrition Goals Re-Evaluation      01/16/16 1105           Personal Goal #1 Re-Evaluation   Personal Goal #1 Wt loss of 8 lb to a goal wt of 172 lb at graduation from Cardiac Rehab       Goal Progress Seen No       Comments Pt wt is up 5 lb. See 01/16/16 RD note       Personal Goal #2 Re-Evaluation   Personal Goal #2  Work to achieve tighter blood glucose control as evidenced by a decrease in Hemoglobin A1c toward a goal of less than 7       Comments No updated A1c available       Intervention Plan   Intervention Continue to educate, counsel and set short/long term goals regarding individualized specific personal dietary modifications.          Psychosocial: Target Goals: Acknowledge presence or absence of depression,  maximize coping skills, provide positive support system. Participant is able to verbalize types and ability to use techniques and skills needed for reducing stress and depression.  Initial Review & Psychosocial Screening:     Initial Psych Review & Screening - 11/24/15 1012    Family Dynamics   Good Support System? Yes   Barriers   Psychosocial barriers to participate in program There are no identifiable barriers or psychosocial needs.   Screening Interventions   Interventions Encouraged to exercise      Quality of Life Scores:     Quality of Life - 11/13/15 1158    Quality of Life Scores   Health/Function Pre 24.67 %   Socioeconomic Pre 25.21 %   Psych/Spiritual Pre 28.29 %   Family Pre 27.6 %   GLOBAL Pre 25.96 %      PHQ-9:     Recent Review Flowsheet Data    Depression screen [facility] 2/9 11/18/2015   Decreased Interest 0   Down, Depressed, Hopeless 0   PHQ - 2 Score 0      Psychosocial Evaluation and Intervention:   Psychosocial Re-Evaluation:     Psychosocial Re-Evaluation      12/10/15 1444 01/02/16 1040 02/04/16 1210       Psychosocial Re-Evaluation   Interventions  Encouraged to attend Cardiac Rehabilitation for the exercise Encouraged to attend Cardiac Rehabilitation for the exercise Encouraged to attend Cardiac Rehabilitation for the exercise     Continued Psychosocial Services Needed No No No        Vocational Rehabilitation: Provide vocational rehab assistance to qualifying candidates.   Vocational Rehab Evaluation & Intervention:     Vocational Rehab - 11/13/15 1102    Initial Vocational Rehab Evaluation & Intervention   Assessment shows need for Vocational Rehabilitation No      Education: Education Goals: Education classes will be provided on a weekly basis, covering required topics. Participant will state understanding/return demonstration of topics presented.  Learning Barriers/Preferences:   Education Topics: Count Your Pulse:   -Group instruction provided by verbal instruction, demonstration, patient participation and written materials to support subject.  Instructors address importance of being able to find your pulse and how to count your pulse when at home without a heart monitor.  Patients get hands on experience counting their pulse with staff help and individually.      CARDIAC REHAB PHASE II EXERCISE from 01/21/2016 in [facility] CARDIAC REHAB   Date  12/19/15   Instruction Review Code  2- meets goals/outcomes      Heart Attack, Angina, and Risk Factor Modification:  -Group instruction provided by verbal instruction, video, and written materials to support subject.  Instructors address signs and symptoms of angina and heart attacks.    Also discuss risk factors for heart disease and how to make changes to improve heart health risk factors.      CARDIAC REHAB PHASE II EXERCISE from 01/21/2016 in [facility] CARDIAC REHAB   Date  11/26/15   Instruction Review Code  2- meets goals/outcomes      Functional Fitness:  -Group instruction provided by verbal instruction, demonstration, patient participation, and written materials to support subject.  Instructors address safety measures for doing things around the house.  Discuss how to get up and down off the floor, how to pick things up properly, how to safely get out of a chair without assistance, and balance training.      CARDIAC REHAB PHASE II EXERCISE from 01/21/2016 in [facility] CARDIAC REHAB   Date  12/05/15   Educator  Fabio Pierce, MA  ACSM RCEP   Instruction Review Code  2- meets goals/outcomes      Meditation and Mindfulness:  -Group instruction provided by verbal instruction, patient participation, and written materials to support subject.  Instructor addresses importance of mindfulness and meditation practice to help reduce stress and improve awareness.  Instructor also leads participants through a  meditation exercise.    Stretching for Flexibility and Mobility:  -Group instruction provided by verbal instruction, patient participation, and written materials to support subject.  Instructors lead participants through series of stretches that are designed to increase flexibility thus improving mobility.  These stretches are additional exercise for major muscle groups that are typically performed during regular warm up and cool down.   Hands Only CPR Anytime:  -Group instruction provided by verbal instruction, video, patient participation and written materials to support subject.  Instructors co-teach with AHA video for hands only CPR.  Participants get hands on experience with mannequins.   Nutrition I class: Heart Healthy Eating:  -Group instruction provided by PowerPoint slides, verbal discussion, and written materials to support subject matter. The instructor gives an explanation and review of the Therapeutic Lifestyle Changes diet recommendations, which includes a  discussion on lipid goals, dietary fat, sodium, fiber, plant stanol/sterol esters, sugar, and the components of a well-balanced, healthy diet.   Nutrition II class: Lifestyle Skills:  -Group instruction provided by PowerPoint slides, verbal discussion, and written materials to support subject matter. The instructor gives an explanation and review of label reading, grocery shopping for heart health, heart healthy recipe modifications, and ways to make healthier choices when eating out.      CARDIAC REHAB PHASE II EXERCISE from 01/21/2016 in [facility] CARDIAC REHAB   Date  01/20/16   Educator  RD   Instruction Review Code  2- meets goals/outcomes      Diabetes Question & Answer:  -Group instruction provided by PowerPoint slides, verbal discussion, and written materials to support subject matter. The instructor gives an explanation and review of diabetes co-morbidities, pre- and post-prandial blood glucose  goals, pre-exercise blood glucose goals, signs, symptoms, and treatment of hypoglycemia and hyperglycemia, and foot care basics.   Diabetes Blitz:  -Group instruction provided by PowerPoint slides, verbal discussion, and written materials to support subject matter. The instructor gives an explanation and review of the physiology behind type 1 and type 2 diabetes, diabetes medications and rational behind using different medications, pre- and post-prandial blood glucose recommendations and Hemoglobin A1c goals, diabetes diet, and exercise including blood glucose guidelines for exercising safely.    Portion Distortion:  -Group instruction provided by PowerPoint slides, verbal discussion, written materials, and food models to support subject matter. The instructor gives an explanation of serving size versus portion size, changes in portions sizes over the last 20 years, and what consists of a serving from each food group.   Stress Management:  -Group instruction provided by verbal instruction, video, and written materials to support subject matter.  Instructors review role of stress in heart disease and how to cope with stress positively.            CARDIAC REHAB PHASE II EXERCISE from 01/21/2016 in [facility] CARDIAC REHAB   Date  12/31/15   Instruction Review Code  2- meets goals/outcomes      Exercising on Your Own:  -Group instruction provided by verbal instruction, power point, and written materials to support subject.  Instructors discuss benefits of exercise, components of exercise, frequency and intensity of exercise, and end points for exercise.  Also discuss use of nitroglycerin and activating EMS.  Review options of places to exercise outside of rehab.  Review guidelines for sex with heart disease.   Cardiac Drugs I:  -Group instruction provided by verbal instruction and written materials to support subject.  Instructor reviews cardiac drug classes: antiplatelets,  anticoagulants, beta blockers, and statins.  Instructor discusses reasons, side effects, and lifestyle considerations for each drug class.   Cardiac Drugs II:  -Group instruction provided by verbal instruction and written materials to support subject.  Instructor reviews cardiac drug classes: angiotensin converting enzyme inhibitors (ACE-I), angiotensin II receptor blockers (ARBs), nitrates, and calcium channel blockers.  Instructor discusses reasons, side effects, and lifestyle considerations for each drug class.   Anatomy and Physiology of the Circulatory System:  -Group instruction provided by verbal instruction, video, and written materials to support subject.  Reviews functional anatomy of heart, how it relates to various diagnoses, and what role the heart plays in the overall system.   Knowledge Questionnaire Score:     Knowledge Questionnaire Score - 11/19/15 1132    Knowledge Questionnaire Score   Pre Score 22/24  DM 12/15      Core Components/Risk Factors/Patient Goals at Admission:     Personal Goals and Risk Factors at Admission - 11/13/15 0840    Core Components/Risk Factors/Patient Goals on Admission    Weight Management Yes   Intervention Weight Management: Develop a combined nutrition and exercise program designed to reach desired caloric intake, while maintaining appropriate intake of nutrient and fiber, sodium and fats, and appropriate energy expenditure required for the weight goal.;Weight Management: Provide education and appropriate resources to help participant work on and attain dietary goals.;Weight Management/Obesity: Establish reasonable short term and long term weight goals.   Admit Weight 180 lb 12.4 oz (82 kg)   Goal Weight: Short Term 175 lb (79.379 kg)   Goal Weight: Long Term 172 lb (78.019 kg)   Expected Outcomes Short Term: Continue to assess and modify interventions until short term weight is achieved.;Long Term: Adherence to nutrition and  physical activity/exercise program aimed toward attainment of established weight goal.   Sedentary --  Improve stamina overall   Intervention Provide advice, education, support and counseling about physical activity/exercise needs.;Develop an individualized exercise prescription for aerobic and resistive training based on initial evaluation findings, risk stratification, comorbidities and participant's personal goals.   Expected Outcomes Achievement of increased cardiorespiratory fitness and enhanced flexibility, muscular endurance and strength shown through measurements of functional capaciy and personal statement of participant.   Diabetes Yes   Intervention Provide education about signs/symptoms and action to take for hypo/hyperglycemia.;Provide education about proper nutrition, including hydration, and aerobic/resistive exercise prescription along with prescribed medications to achieve blood glucose in normal ranges: Fasting glucose 65-99 mg/dL   Expected Outcomes Short Term: Participant verbalizes understanding of the signs/symptoms and immediate care of hyper/hypoglycemia, proper foot care and importance of medication, aerobic/resistive exercise and nutrition plan for blood glucose control.;Long Term: Attainment of HbA1C < 7%.   Hypertension Yes   Intervention Provide education on lifestyle modifcations including regular physical activity/exercise, weight management, moderate sodium restriction and increased consumption of fresh fruit, vegetables, and low fat dairy, alcohol moderation, and smoking cessation.;Monitor prescription use compliance.   Expected Outcomes Short Term: Continued assessment and intervention until BP is < 140/78mm HG in hypertensive participants. < 130/76mm HG in hypertensive participants with diabetes, heart failure or chronic kidney disease.;Long Term: Maintenance of blood pressure at goal levels.   Lipids Yes   Intervention Provide education and support for participant on  nutrition & aerobic/resistive exercise along with prescribed medications to achieve LDL 70mg , HDL >40mg .   Expected Outcomes Short Term: Participant states understanding of desired cholesterol values and is compliant with medications prescribed. Participant is following exercise prescription and nutrition guidelines.;Long Term: Cholesterol controlled with medications as prescribed, with individualized exercise RX and with personalized nutrition plan. Value goals: LDL < 70mg , HDL > 40 mg.   Stress Yes   Intervention Offer individual and/or small group education and counseling on adjustment to heart disease, stress management and health-related lifestyle change. Teach and support self-help strategies.   Expected Outcomes Short Term: Participant demonstrates changes in health-related behavior, relaxation and other stress management skills, ability to obtain effective social support, and compliance with psychotropic medications if prescribed.;Long Term: Emotional wellbeing is indicated by absence of clinically significant psychosocial distress or social isolation.   Personal Goal Other Yes   Personal Goal Learn limits for exercise   Intervention Adhere to exercise prescription for program   Expected Outcomes Know how to exercise independently      Core Components/Risk Factors/Patient Goals Review:  Goals and Risk Factor Review      12/03/15 1014 12/19/15 1003 12/31/15 1037 01/16/16 1047 01/30/16 1424   Core Components/Risk Factors/Patient Goals Review   Personal Goals Review Weight Management/Obesity;Sedentary;Diabetes;Hypertension;Lipids;Other Weight Management/Obesity;Sedentary;Diabetes;Other Weight Management/Obesity;Increase Strength and Stamina;Sedentary;Hypertension;Stress;Other Weight Management/Obesity;Diabetes Sedentary;Increase Strength and Stamina;Other;Diabetes   Review Pt's weight is down about a kilogram from start.  He is coming to rehab 3x week and plans to start his home exercise  this week.  His blood sugars have stabilized and blood pressures are better overall.  He is not having any problems with his statins.  Since he has been in the program, he is learning what his limits are for exericse. Weight has gone up again, not been eating as well.  Has started to exercise one day a week in addition to program.  Blood sugars have improved. Pt knows his limits and stamina has improved greatly!  He is walking at home on trails at park.  His weight was up after holiday weekend.  He continues to have some elevated blood pressures.  He is planning to attend Stress education today. Pt wt today 84.1 kg (185 lb), which is up 5 lb from admission. Pt reports he is exercising an additional 2 days/week outside of rehab. See 01/16/16 RD note. Pt feels that blood sugar has been better since he began exercise. Pt is walking at least one other day outside of CR and feels his stamina is better.   Expected Outcomes Continue to improve across the board. Will start to exercise more anad watch diet Continue to be able to exercise and lose more weight. Continue to be able to exercise and lose more weight. Continue exercise program at home and an additional day to current regimine. Do hand weight exercises at home in addition to at CR.      Core Components/Risk Factors/Patient Goals at Discharge (Final Review):      Goals and Risk Factor Review - 01/30/16 1424    Core Components/Risk Factors/Patient Goals Review   Personal Goals Review Sedentary;Increase Strength and Stamina;Other;Diabetes   Review Pt feels that blood sugar has been better since he began exercise. Pt is walking at least one other day outside of CR and feels his stamina is better.   Expected Outcomes Continue exercise program at home and an additional day to current regimine. Do hand weight exercises at home in addition to at CR.      ITP Comments:     ITP Comments      11/13/15 0806 11/13/15 1040 11/17/15 1041       ITP Comments  Medical Director is Dr. Armanda Magic, MD Medical Director is Dr. Armanda Magic, MD Medical Director is Dr. Armanda Magic, MD        Comments: *Pt is making expected progress toward personal goals after completing 28 sessions. Recommend continued exercise and life style modification education including  stress management and relaxation techniques to decrease cardiac risk profile. 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 Gladstone Lighter, RN,BSN 02/05/2016 9:54 AM

## 2016-02-05 ENCOUNTER — Ambulatory Visit (INDEPENDENT_AMBULATORY_CARE_PROVIDER_SITE_OTHER): Payer: BC Managed Care – PPO | Admitting: Cardiology

## 2016-02-05 ENCOUNTER — Encounter: Payer: Self-pay | Admitting: Cardiology

## 2016-02-05 VITALS — BP 122/78 | HR 62 | Ht 67.0 in | Wt 188.0 lb

## 2016-02-05 DIAGNOSIS — Z952 Presence of prosthetic heart valve: Secondary | ICD-10-CM

## 2016-02-05 DIAGNOSIS — Z9861 Coronary angioplasty status: Secondary | ICD-10-CM

## 2016-02-05 DIAGNOSIS — Z7901 Long term (current) use of anticoagulants: Secondary | ICD-10-CM

## 2016-02-05 DIAGNOSIS — I2119 ST elevation (STEMI) myocardial infarction involving other coronary artery of inferior wall: Secondary | ICD-10-CM | POA: Diagnosis not present

## 2016-02-05 DIAGNOSIS — I2510 Atherosclerotic heart disease of native coronary artery without angina pectoris: Secondary | ICD-10-CM

## 2016-02-05 DIAGNOSIS — Z954 Presence of other heart-valve replacement: Secondary | ICD-10-CM

## 2016-02-05 DIAGNOSIS — Q231 Congenital insufficiency of aortic valve: Secondary | ICD-10-CM | POA: Diagnosis not present

## 2016-02-05 DIAGNOSIS — E1165 Type 2 diabetes mellitus with hyperglycemia: Secondary | ICD-10-CM

## 2016-02-05 DIAGNOSIS — Q230 Congenital stenosis of aortic valve: Secondary | ICD-10-CM

## 2016-02-05 DIAGNOSIS — Z8679 Personal history of other diseases of the circulatory system: Secondary | ICD-10-CM

## 2016-02-05 DIAGNOSIS — I77810 Thoracic aortic ectasia: Secondary | ICD-10-CM

## 2016-02-05 DIAGNOSIS — E785 Hyperlipidemia, unspecified: Secondary | ICD-10-CM

## 2016-02-05 DIAGNOSIS — I350 Nonrheumatic aortic (valve) stenosis: Secondary | ICD-10-CM

## 2016-02-05 DIAGNOSIS — Z9889 Other specified postprocedural states: Secondary | ICD-10-CM

## 2016-02-05 LAB — HEMOGLOBIN A1C
Hgb A1c MFr Bld: 6.6 % — ABNORMAL HIGH (ref <5.7)
Mean Plasma Glucose: 143 mg/dL

## 2016-02-05 LAB — CBC
HCT: 40.5 % (ref 38.5–50.0)
Hemoglobin: 13.5 g/dL (ref 13.2–17.1)
MCH: 27.2 pg (ref 27.0–33.0)
MCHC: 33.3 g/dL (ref 32.0–36.0)
MCV: 81.5 fL (ref 80.0–100.0)
MPV: 10.7 fL (ref 7.5–12.5)
Platelets: 336 10*3/uL (ref 140–400)
RBC: 4.97 MIL/uL (ref 4.20–5.80)
RDW: 15 % (ref 11.0–15.0)
WBC: 5.9 10*3/uL (ref 3.8–10.8)

## 2016-02-05 LAB — LIPID PANEL
Cholesterol: 110 mg/dL — ABNORMAL LOW (ref 125–200)
HDL: 30 mg/dL — ABNORMAL LOW (ref >=40)
LDL Cholesterol: 46 mg/dL (ref <130)
Total CHOL/HDL Ratio: 3.7 Ratio (ref <=5.0)
Triglycerides: 172 mg/dL — ABNORMAL HIGH (ref <150)
VLDL: 34 mg/dL — ABNORMAL HIGH (ref <30)

## 2016-02-05 LAB — COMPREHENSIVE METABOLIC PANEL
ALT: 18 U/L (ref 9–46)
AST: 18 U/L (ref 10–35)
Albumin: 4.4 g/dL (ref 3.6–5.1)
Alkaline Phosphatase: 31 U/L — ABNORMAL LOW (ref 40–115)
BUN: 10 mg/dL (ref 7–25)
CO2: 24 mmol/L (ref 20–31)
Calcium: 9.5 mg/dL (ref 8.6–10.3)
Chloride: 106 mmol/L (ref 98–110)
Creat: 0.88 mg/dL (ref 0.70–1.33)
Glucose, Bld: 84 mg/dL (ref 65–99)
Potassium: 4.7 mmol/L (ref 3.5–5.3)
Sodium: 139 mmol/L (ref 135–146)
Total Bilirubin: 0.6 mg/dL (ref 0.2–1.2)
Total Protein: 6.6 g/dL (ref 6.1–8.1)

## 2016-02-05 NOTE — Progress Notes (Signed)
PCP: Leanor Rubenstein, MD  Clinic Note: Chief Complaint  Patient presents with  . Follow-up  . Cardiac Valve Problem    Status post aVR  . Coronary Artery Disease    History of inferior STEMI    HPI: Dennis Bates is a 55 y.o. male with a PMH below who presents today for ~2 month f/u for CAD & s/p AVR (Bentall procedure for ST Jude Mechanical AVR & replacement of Ascending Aorta for aneurysm) for Bicuspid Aortic Valve - Oct 09, 2015.He was originally seen as an inferior STEMI in January 2016. He had an occluded Circumflex treated with a DES stent, was also noted to have significant aortic gradient on aortic valve pullback gradient. Echocardiogram confirmed severe aortic stenosis with bicuspid aortic valve. After completing his full year of antiplatelet agent, he has now undergone his Bentall procedure with AVR.  Dennis Bates was last seen on Oct 27 2015 by Theodore Demark, PA for post-op f/u. - was readmitted post-op 2/2 leukocytosis & tachycardia.  1/4 Bld Cx Coag Neg Staph = contaminate. Repeat Cx were negative.  I last saw him in March 2016. Saw Wilburt Finlay in March 2017 for CP evaluation - likely not cardiac issue. The pain was under his left clavicle sharp. This is thought to be musculoskeletal or potentially from incision related nerve irritation. He has since been back to cardiac rehabilitation and doing well.  Recent Hospitalizations: None  Studies Reviewed:   Preop Cardiac Cath 09/18/2015: Widely patent circumflex stent with 20-25% lesions in the RCA & LAD.    Interval History: Dennis Bates presents today really with no complaints. No further episodes of chest discomfort. No anginal symptoms with rest or exertion. He is doing well with current rehabilitation. His blood pressures been looking great. He denies any sensation of rapid irregular heartbeats palpitations. No issues with his sternal site. Recurrence of fevers chills or sweats. No PND, orthopnea or edema. No lightheadedness,  dizziness, weakness or syncope/near syncope. No TIA/amaurosis fugax symptoms. No melena, hematochezia, hematuria, or epstaxis. No claudication.  ROS: A comprehensive was performed. Review of Systems  Constitutional: Negative for fever and chills.  HENT: Negative for nosebleeds.   Respiratory: Negative for cough, shortness of breath and wheezing.   Cardiovascular: Negative for claudication.  Gastrointestinal: Negative for heartburn.  Musculoskeletal: Negative for myalgias, back pain, joint pain and falls.  Neurological: Negative for dizziness and loss of consciousness.  Psychiatric/Behavioral: Negative for depression and memory loss. The patient is not nervous/anxious and does not have insomnia.   All other systems reviewed and are negative.    Past Medical History  Diagnosis Date  . Hyperlipidemia   . Bell's palsy   . Aortic stenosis due to bicuspid aortic valve     Probable bicuspid aortic valve: a. mod-sev by 2D ECHO 09/2014. AVA 0.9cm^2; b) 10/2014 Echo:. Severe AS Mn-Pk Gradient 41-71 mmHg, AVA ~0.79 cm2, EF 60-65%  . Hypertriglyceridemia   . CAD S/P percutaneous coronary angioplasty 09/20/2014    a. inferolat STEMI ->> LHC-Angio: Proximal/ostial LAD 20%, OM2 99% -->> PCI: 3mm x 16 mm Promus Premier DES to the OM2.(~3.5 mm)  . ST elevation myocardial infarction (STEMI) of inferior wall (HCC) 09/20/2014  . Hypertension   . Heart murmur   . Shortness of breath dyspnea   . Diabetes mellitus type 2, controlled, with complications [facility])     Past Surgical History  Procedure Laterality Date  . Cardiac catheterization      remote h/o cath >15 yrs ago, unkown  .  Doppler echocardiography  January 2016, February2016    Probable bicuspid aortic valve: a. mod-sev by 2D ECHO 09/2014. AVA 0.9cm^2; b) 10/2014 Echo:. Severe AS Mn-Pk Gradient 41-71 mmHg, AVA ~0.79 cm2, EF 60-65%  . Left heart catheterization with coronary angiogram N/A 09/20/2014    Procedure: LEFT HEART CATHETERIZATION WITH  CORONARY ANGIOGRAM;  Surgeon: Marykay Lex, MD;  Location: [facility] CATH LAB;  Service: Cardiovascular;  Laterality: N/A;  . Percutaneous coronary stent intervention (pci-s)  09/20/2014    PTCI to OM 2 with Promus Premier DES 3.0 mm x 16 mm (3.5 mm)  . Knee arthroscopy w/ acl reconstruction Right     90's  . Bentall procedure N/A 10/09/2015    Procedure: BENTALL PROCEDURE (using a St Jude mechanical valve, size 23);  Surgeon: Alleen Borne, MD;  Location: [facility] OR;  Service: Open Heart Surgery;  Laterality: N/A;  CIRC ARREST  NEEDS RIGHT RADIAL A-LINE  . Tee without cardioversion N/A 10/09/2015    Procedure: TRANSESOPHAGEAL ECHOCARDIOGRAM (TEE);  Surgeon: Alleen Borne, MD;  Location: [facility] OR;  Service: Open Heart Surgery;  Laterality: N/A;    Prior to Admission medications   Medication Sig Start Date End Date Taking? Authorizing Provider  acetaminophen (TYLENOL) 325 MG tablet Take 2 tablets (650 mg total) by mouth every 6 (six) hours as needed for mild pain. 10/17/15  Yes Donielle Margaretann Loveless, PA-C  aspirin EC 81 MG EC tablet Take 1 tablet (81 mg total) by mouth daily. Patient taking differently: Take 81 mg by mouth every morning.  09/23/14  Yes Janetta Hora, PA-C  atorvastatin (LIPITOR) 80 MG tablet Take 0.5 tablets (40 mg total) by mouth at bedtime. 12/10/15  Yes Dwana Melena, PA-C  carvedilol (COREG) 3.125 MG tablet Take 1 tablet (3.125 mg total) by mouth 2 (two) times daily with a meal. 02/02/16  Yes Marykay Lex, MD  fenofibrate 160 MG tablet Take 1 tablet (160 mg total) by mouth daily. 02/02/16  Yes Marykay Lex, MD  glipiZIDE (GLUCOTROL XL) 5 MG 24 hr tablet Take 1 tablet (5 mg total) by mouth daily with breakfast. 02/02/16  Yes Marykay Lex, MD  metFORMIN (GLUCOPHAGE) 1000 MG tablet Take 1,000 mg by mouth 2 (two) times daily with a meal.  03/27/14  Yes Historical Provider, MD  warfarin (COUMADIN) 5 MG tablet Take 1.5 to 2 tablets by mouth daily or as directed by coumadin clinic 11/28/15   Yes Marykay Lex, MD     No Known Allergies   Social History   Social History  . Marital Status: Married    Spouse Name: N/A  . Number of Children: N/A  . Years of Education: N/A   Social History Main Topics  . Smoking status: Never Smoker   . Smokeless tobacco: None  . Alcohol Use: 0.0 oz/week    0 Standard drinks or equivalent per week     Comment: socially  . Drug Use: No  . Sexual Activity: Not Asked   Other Topics Concern  . None   Social History Narrative    Family History  Problem Relation Age of Onset  . CAD Neg Hx     per wife  . Heart disease Mother     Wt Readings from Last 3 Encounters:  02/05/16 188 lb (85.276 kg)  12/10/15 183 lb (83.008 kg)  11/13/15 180 lb 12.4 oz (82 kg)    PHYSICAL EXAM BP 122/78 mmHg  Pulse 62  Ht  (1.702  m)  Wt 188 lb (85.276 kg)  BMI 29.44 kg/m2 General appearance: alert, cooperative, appears stated age, no distress and Borderline obese Neck: no adenopathy, no carotid bruit and no JVD HEENT: [place]/AT, EOMI, MMM, anicteric sclera (he has a slight mouth droop at baseline Lungs: clear to auscultation bilaterally, normal percussion bilaterally and non-labored Heart: regular rate and rhythm, S1 normal, crisp metallic S2 with soft outflow murmur. No click, rub or gallop; non-displaced PMI. Abdomen: soft, non-tender; bowel sounds normal; no masses,  no organomegaly; No HJR Extremities: extremities normal, atraumatic, no cyanosis, or edema Pulses: 2+ and symmetric; Skin: normal and mobility and turgor normal  Neurologic: Mental status: Alert, oriented, thought content appropriate Cranial nerves: normal (II-XII grossly intact)    Adult ECG Report Not checked  Other studies Reviewed: Additional studies/ records that were reviewed today include:  Recent Labs:  Has not had lipids in over one year.   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 EF was stable. No longer on dual antiplatelet agents - as he is still on Coumadin for his valve. He is on aspirin. On beta blocker and statin.  At present, no plans to further titrate carvedilol. However his blood pressure is higher in follow-up, may want to consider adding ACE inhibitor or ARB -- Because of his diabetes.      Relevant Orders   ECHOCARDIOGRAM COMPLETE   CBC (Completed)   Lipid panel (Completed)   Comprehensive metabolic panel (Completed)   Hemoglobin A1c (Completed)   Long term (current) use of anticoagulants [Z79.01]    On warfarin. No bleeding issues, we will check a CBC to exclude occult bleeding issues.      Dyslipidemia, goal LDL below 70 (Chronic)    Remains on fenofibrate and atorvastatin at high-dose. He had a pretty good drop during last visit. He is due for check now. He has not eaten today so we will go ahead and order lipid panel and LFTs with full chemistry panel today. Pending results, we can consider backing off atorvastatin.      Relevant Orders   ECHOCARDIOGRAM COMPLETE   CBC (Completed)   Lipid panel (Completed)   Comprehensive metabolic panel (Completed)   Hemoglobin A1c (Completed)   CAD S/P p DES PCI to the Circumflex-OM 2 (Chronic)    Patent stent on exam with otherwise stable mild disease throughout. Continue current medication regimen.      Aortic valve replaced (Chronic)    Status post mechanical aVR. Has not had a postop follow-up echocardiogram. We will go ahead and check another 2-D echocardiogram for baseline follow-up. Sounds like it's working well on exam.      Aortic stenosis due to bicuspid aortic valve (Chronic)   Relevant Orders    ECHOCARDIOGRAM COMPLETE   CBC (Completed)   Lipid panel (Completed)   Comprehensive metabolic panel (Completed)   Hemoglobin A1c (Completed)   Aortic root dilatation (HCC)    Status post aortic root replacement with Bentall procedure. Per Dr. Laneta Simmers, no plans to follow-up with CT scan.       Other Visit Diagnoses    H/O aortic valve repair  Relevant Orders    ECHOCARDIOGRAM COMPLETE       Current medicines are reviewed at length with the patient today. (+/- concerns) None The following changes have been made: None Studies Ordered:   Orders Placed This Encounter  Procedures  . CBC  . Lipid panel  . Comprehensive metabolic panel  . Hemoglobin A1c  . ECHOCARDIOGRAM COMPLETE    ROV - Jan 2018    Bryan Lemmaavid Harding, M.D., M.S. Interventional Cardiologist   Pager # [phone] Phone # [phone] [address] [place], [place] [id number]

## 2016-02-05 NOTE — Patient Instructions (Signed)
Labs - cmp,cbc,lipid, hgb a1c   Your physician has requested that you have an echocardiogram at [facility]. Echocardiography is a painless test that uses sound waves to create images of your heart. It provides your doctor with information about the size and shape of your heart and how well your heart's chambers and valves are working. This procedure takes approximately one hour. There are no restrictions for this procedure.   Your physician wants you to follow-up in JAN 2018 WITH DR HARDING. You will receive a reminder letter in the mail two months in advance. If you don't receive a letter, please call our office to schedule the follow-up appointment.  If you need a refill on your cardiac medications before your next appointment, please call your pharmacy.

## 2016-02-06 ENCOUNTER — Encounter (HOSPITAL_COMMUNITY)
Admission: RE | Admit: 2016-02-06 | Discharge: 2016-02-06 | Disposition: A | Discharge status: Home / Self Care | Payer: BC Managed Care – PPO | Source: Ambulatory Visit | Attending: Cardiology | Admitting: Cardiology

## 2016-02-06 DIAGNOSIS — Z954 Presence of other heart-valve replacement: Secondary | ICD-10-CM | POA: Diagnosis not present

## 2016-02-06 DIAGNOSIS — Z952 Presence of prosthetic heart valve: Secondary | ICD-10-CM

## 2016-02-07 ENCOUNTER — Encounter: Payer: Self-pay | Admitting: Cardiology

## 2016-02-07 NOTE — Assessment & Plan Note (Addendum)
Remains on fenofibrate and atorvastatin at high-dose. He had a pretty good drop during last visit. He is due for check now. He has not eaten today so we will go ahead and order lipid panel and LFTs with full chemistry panel today. Pending results, we can consider backing off atorvastatin.

## 2016-02-07 NOTE — Assessment & Plan Note (Signed)
Status post mechanical aVR. Has not had a postop follow-up echocardiogram. We will go ahead and check another 2-D echocardiogram for baseline follow-up. Sounds like it's working well on exam.

## 2016-02-07 NOTE — Assessment & Plan Note (Signed)
Patent stent on exam with otherwise stable mild disease throughout. Continue current medication regimen.

## 2016-02-07 NOTE — Assessment & Plan Note (Signed)
On warfarin. No bleeding issues, we will check a CBC to exclude occult bleeding issues.

## 2016-02-07 NOTE — Assessment & Plan Note (Signed)
Status post aortic root replacement with Bentall procedure. Per Dr. [name], no plans to follow-up with CT scan.

## 2016-02-07 NOTE — Assessment & Plan Note (Signed)
No recurrent anginal symptoms. EF was stable. No longer on dual antiplatelet agents - as he is still on Coumadin for his valve. He is on aspirin. On beta blocker and statin.  At present, no plans to further titrate carvedilol. However his blood pressure is higher in follow-up, may want to consider adding ACE inhibitor or ARB -- Because of his diabetes.

## 2016-02-07 NOTE — Assessment & Plan Note (Signed)
He has not had an A1c checked in a while. He is in the process of changing his primary physician. We will go ahead and check an A1c while we check his lipid panel.

## 2016-02-09 ENCOUNTER — Encounter (HOSPITAL_COMMUNITY): Payer: BC Managed Care – PPO

## 2016-02-10 ENCOUNTER — Telehealth: Payer: Self-pay | Admitting: *Deleted

## 2016-02-10 NOTE — Telephone Encounter (Signed)
LEFT MESSAGE TO CALL BACK

## 2016-02-10 NOTE — Telephone Encounter (Signed)
-----   Message from Marykay Lexavid W Harding, MD sent at 02/06/2016  7:06 AM EDT ----- Randie HeinzGreat news. Cholesterol still looks good. Triglycerides are just a little bit elevated as compared to the significantly elevated in the past. At this point I think we can probably safely reduce atorvastatin to 40 mg. Recheck labs in 6 months.  Chemistry panel with kidney function, and liver function both look fine. Hemoglobin A1c continues to improve being 6.6 now. This indicates pretty good control. Blood count stable. Improved from postoperative anemia  Bryan Lemmaavid Harding, MD

## 2016-02-11 ENCOUNTER — Encounter (HOSPITAL_COMMUNITY)
Admission: RE | Admit: 2016-02-11 | Discharge: 2016-02-11 | Disposition: A | Discharge status: Home / Self Care | Payer: BC Managed Care – PPO | Source: Ambulatory Visit | Attending: Cardiology | Admitting: Cardiology

## 2016-02-11 VITALS — BP 116/72 | HR 87 | Ht 67.75 in | Wt 185.2 lb

## 2016-02-11 DIAGNOSIS — Z952 Presence of prosthetic heart valve: Secondary | ICD-10-CM

## 2016-02-11 DIAGNOSIS — Z954 Presence of other heart-valve replacement: Secondary | ICD-10-CM | POA: Diagnosis not present

## 2016-02-11 LAB — GLUCOSE, CAPILLARY: Glucose-Capillary: 181 mg/dL — ABNORMAL HIGH (ref 65–99)

## 2016-02-11 NOTE — Progress Notes (Signed)
Discharge Summary  Patient Details  Name: Dennis Bates MRN: [id number] Date of Birth: 04-12-1961 Referring Provider:            CARDIAC REHAB PHASE II EXERCISE from 12/10/2015 in [place]   Referring Provider  Glenetta Hew MD       Number of Visits: 31  Reason for Discharge:  Early Exit:  due to vacation and work obligations.  Smoking History:  History  Smoking status  . Never Smoker   Smokeless tobacco  . Not on file    Diagnosis:  S/P AVR  ADL UCSD:   Initial Exercise Prescription:     Initial Exercise Prescription - 12/25/15 1400    Date of Initial Exercise RX and Referring Provider   Date 11/13/15   Referring Provider Glenetta Hew MD   Treadmill   MPH 3.2   Grade 4   Minutes 10   METs 5.21   Bike   Level 1.6   Minutes 10   METs 4.63   NuStep   Level 5   Minutes 10   METs 4.8   Resistance Training   Training Prescription Yes   Weight 6 lbs   Reps 10-12      Discharge Exercise Prescription (Final Exercise Prescription Changes):     Exercise Prescription Changes - 02/11/16 1700    Exercise Review   Progression Yes   Response to Exercise   Blood Pressure (Admit) 116/72 mmHg   Blood Pressure (Exercise) 146/78 mmHg   Blood Pressure (Exit) 108/70 mmHg   Heart Rate (Admit) 87 bpm   Heart Rate (Exercise) 140 bpm   Heart Rate (Exit) 83 bpm   Rating of Perceived Exertion (Exercise) 14   Symptoms none   Comments Home Exercise reviewed o 11/26/15   Duration Progress to 30 minutes of continuous aerobic without signs/symptoms of physical distress   Intensity THRR unchanged   Progression   Progression Continue to progress workloads to maintain intensity without signs/symptoms of physical distress.   Average METs 6.3   Resistance Training   Training Prescription Yes   Weight 10 lbs   Reps 10-12   Interval Training   Interval Training No   Treadmill   MPH 3.2   Grade 10   Minutes 10   METs 7.86   Bike    Level 1.6   Minutes 10   METs 4.6   NuStep   Level 9   Minutes 10   METs 6.4   Home Exercise Plan   Plans to continue exercise at Home  walking and biking   Frequency Add 3 additional days to program exercise sessions.      Functional Capacity:     6 Minute Walk      11/13/15 1041 02/11/16 1100     6 Minute Walk   Phase Initial Discharge    Distance 1800 feet 2418 feet    Distance % Change  34.33 %    Walk Time 6 minutes 6 minutes    # of Rest Breaks 0 0    MPH 3.41 4.58    METS 4.73 6.23    RPE 11 12    VO2 Peak 16.54 21.8    Symptoms No     Resting HR 89 bpm 83 bpm    Resting BP 118/68 mmHg 108/70 mmHg    Max Ex. HR 106 bpm 140 bpm    Max Ex. BP 120/68 mmHg 146/78 mmHg  Psychological, QOL, Others - Outcomes: PHQ 2/9: Depression screen [facility] 2/9 02/11/2016 11/18/2015  Decreased Interest 0 0  Down, Depressed, Hopeless 0 0  PHQ - 2 Score 0 0    Quality of Life:     Quality of Life - 11/13/15 1158    Quality of Life Scores   Health/Function Pre 24.67 %   Socioeconomic Pre 25.21 %   Psych/Spiritual Pre 28.29 %   Family Pre 27.6 %   GLOBAL Pre 25.96 %      Personal Goals: Goals established at orientation with interventions provided to work toward goal.     Personal Goals and Risk Factors at Admission - 11/13/15 0840    Core Components/Risk Factors/Patient Goals on Admission    Weight Management Yes   Intervention Weight Management: Develop a combined nutrition and exercise program designed to reach desired caloric intake, while maintaining appropriate intake of nutrient and fiber, sodium and fats, and appropriate energy expenditure required for the weight goal.;Weight Management: Provide education and appropriate resources to help participant work on and attain dietary goals.;Weight Management/Obesity: Establish reasonable short term and long term weight goals.   Admit Weight 180 lb 12.4 oz (82 kg)   Goal Weight: Short Term 175 lb (79.379 kg)   Goal  Weight: Long Term 172 lb (78.019 kg)   Expected Outcomes Short Term: Continue to assess and modify interventions until short term weight is achieved.;Long Term: Adherence to nutrition and physical activity/exercise program aimed toward attainment of established weight goal.   Sedentary --  Improve stamina overall   Intervention Provide advice, education, support and counseling about physical activity/exercise needs.;Develop an individualized exercise prescription for aerobic and resistive training based on initial evaluation findings, risk stratification, comorbidities and participant's personal goals.   Expected Outcomes Achievement of increased cardiorespiratory fitness and enhanced flexibility, muscular endurance and strength shown through measurements of functional capaciy and personal statement of participant.   Diabetes Yes   Intervention Provide education about signs/symptoms and action to take for hypo/hyperglycemia.;Provide education about proper nutrition, including hydration, and aerobic/resistive exercise prescription along with prescribed medications to achieve blood glucose in normal ranges: Fasting glucose 65-99 mg/dL   Expected Outcomes Short Term: Participant verbalizes understanding of the signs/symptoms and immediate care of hyper/hypoglycemia, proper foot care and importance of medication, aerobic/resistive exercise and nutrition plan for blood glucose control.;Long Term: Attainment of HbA1C < 7%.   Hypertension Yes   Intervention Provide education on lifestyle modifcations including regular physical activity/exercise, weight management, moderate sodium restriction and increased consumption of fresh fruit, vegetables, and low fat dairy, alcohol moderation, and smoking cessation.;Monitor prescription use compliance.   Expected Outcomes Short Term: Continued assessment and intervention until BP is < 140/69m HG in hypertensive participants. < 130/834mHG in hypertensive participants with  diabetes, heart failure or chronic kidney disease.;Long Term: Maintenance of blood pressure at goal levels.   Lipids Yes   Intervention Provide education and support for participant on nutrition & aerobic/resistive exercise along with prescribed medications to achieve LDL '70mg'$ , HDL >'40mg'$ .   Expected Outcomes Short Term: Participant states understanding of desired cholesterol values and is compliant with medications prescribed. Participant is following exercise prescription and nutrition guidelines.;Long Term: Cholesterol controlled with medications as prescribed, with individualized exercise RX and with personalized nutrition plan. Value goals: LDL < '70mg'$ , HDL > 40 mg.   Stress Yes   Intervention Offer individual and/or small group education and counseling on adjustment to heart disease, stress management and health-related lifestyle change. Teach and support self-help  strategies.   Expected Outcomes Short Term: Participant demonstrates changes in health-related behavior, relaxation and other stress management skills, ability to obtain effective social support, and compliance with psychotropic medications if prescribed.;Long Term: Emotional wellbeing is indicated by absence of clinically significant psychosocial distress or social isolation.   Personal Goal Other Yes   Personal Goal Learn limits for exercise   Intervention Adhere to exercise prescription for program   Expected Outcomes Know how to exercise independently       Personal Goals Discharge:     Goals and Risk Factor Review      12/03/15 1014 12/19/15 1003 12/31/15 1037 01/16/16 1047 01/30/16 1424   Core Components/Risk Factors/Patient Goals Review   Personal Goals Review Weight Management/Obesity;Sedentary;Diabetes;Hypertension;Lipids;Other Weight Management/Obesity;Sedentary;Diabetes;Other Weight Management/Obesity;Increase Strength and Stamina;Sedentary;Hypertension;Stress;Other Weight Management/Obesity;Diabetes Sedentary;Increase  Strength and Stamina;Other;Diabetes   Review Pt's weight is down about a kilogram from start.  He is coming to rehab 3x week and plans to start his home exercise this week.  His blood sugars have stabilized and blood pressures are better overall.  He is not having any problems with his statins.  Since he has been in the program, he is learning what his limits are for exericse. Weight has gone up again, not been eating as well.  Has started to exercise one day a week in addition to program.  Blood sugars have improved. Pt knows his limits and stamina has improved greatly!  He is walking at home on trails at park.  His weight was up after holiday weekend.  He continues to have some elevated blood pressures.  He is planning to attend Stress education today. Pt wt today 84.1 kg (185 lb), which is up 5 lb from admission. Pt reports he is exercising an additional 2 days/week outside of rehab. See 01/16/16 RD note. Pt feels that blood sugar has been better since he began exercise. Pt is walking at least one other day outside of CR and feels his stamina is better.   Expected Outcomes Continue to improve across the board. Will start to exercise more anad watch diet Continue to be able to exercise and lose more weight. Continue to be able to exercise and lose more weight. Continue exercise program at home and an additional day to current regimine. Do hand weight exercises at home in addition to at CR.      Nutrition & Weight - Outcomes:     Pre Biometrics - 11/13/15 1144    Pre Biometrics   Height 5' 7.75" (1.721 m)   Weight 180 lb 12.4 oz (82 kg)   Waist Circumference 37 inches   Hip Circumference 37 inches   Waist to Hip Ratio 1 %   BMI (Calculated) 27.7   Triceps Skinfold 6 mm   % Body Fat 22 %   Grip Strength 6 kg   Flexibility 7.5 in   Single Leg Stand 30 seconds       Nutrition:     Nutrition Therapy & Goals - 11/20/15 1446    Nutrition Therapy   Diet Diabetic, Therapeutic Lifestyle Changes    Drug/Food Interactions Coumadin/Vit K   Personal Nutrition Goals   Personal Goal #1 Wt loss of 8 lb to a goal wt of 172 lb at graduation from [place] Goal #2  Work to achieve tighter blood glucose control as evidenced by a decrease in Hemoglobin A1c toward a goal of less than 7  Last A1c 8.9   Intervention Plan   Intervention  Prescribe, educate and counsel regarding individualized specific dietary modifications aiming towards targeted core components such as weight, hypertension, lipid management, diabetes, heart failure and other comorbidities.   Expected Outcomes Short Term Goal: Understand basic principles of dietary content, such as calories, fat, sodium, cholesterol and nutrients.;Long Term Goal: Adherence to prescribed nutrition plan.      Nutrition Discharge:     Nutrition Assessments - 02/13/16 1337    MEDFICTS Scores   Pre Score 51   Post Score --  not available at this time      Education Questionnaire Score:     Knowledge Questionnaire Score - 11/19/15 1132    Knowledge Questionnaire Score   Pre Score 22/24          DM 12/15      Goals reviewed with patient; copy given to patient.Pt graduated from cardiac rehab program today with completion of 31 exercise sessions in Phase II. Pt maintained good attendance and progressed nicely during his participation in rehab as evidenced by increased MET level.   Medication list reconciled. Repeat  PHQ score- 0 .  Pt has made significant lifestyle changes and should be commended for his success. Pt feels he has achieved his goals during cardiac rehab.   Pt plans to continue exercise on his own at home. Gershon Mussel has a treadmill and bike. We are proud of [name]'s progress.Barnet Pall, RN,BSN 02/17/2016 10:29 AM

## 2016-02-12 HISTORY — PX: TRANSTHORACIC ECHOCARDIOGRAM: SHX275

## 2016-02-13 ENCOUNTER — Encounter (HOSPITAL_COMMUNITY): Payer: BC Managed Care – PPO

## 2016-02-16 ENCOUNTER — Encounter (HOSPITAL_COMMUNITY): Payer: BC Managed Care – PPO

## 2016-02-18 ENCOUNTER — Encounter (HOSPITAL_COMMUNITY): Payer: BC Managed Care – PPO

## 2016-02-20 ENCOUNTER — Encounter (HOSPITAL_COMMUNITY): Payer: BC Managed Care – PPO

## 2016-02-20 ENCOUNTER — Encounter: Payer: Self-pay | Admitting: Cardiology

## 2016-02-24 ENCOUNTER — Other Ambulatory Visit: Payer: Self-pay

## 2016-02-24 ENCOUNTER — Ambulatory Visit (INDEPENDENT_AMBULATORY_CARE_PROVIDER_SITE_OTHER): Payer: BC Managed Care – PPO | Admitting: Pharmacist Clinician (PhC)/ Clinical Pharmacy Specialist

## 2016-02-24 ENCOUNTER — Ambulatory Visit (HOSPITAL_COMMUNITY): Payer: BC Managed Care – PPO | Attending: Internal Medicine

## 2016-02-24 DIAGNOSIS — Z9889 Other specified postprocedural states: Secondary | ICD-10-CM | POA: Diagnosis not present

## 2016-02-24 DIAGNOSIS — Q230 Congenital stenosis of aortic valve: Secondary | ICD-10-CM

## 2016-02-24 DIAGNOSIS — Z8249 Family history of ischemic heart disease and other diseases of the circulatory system: Secondary | ICD-10-CM | POA: Diagnosis not present

## 2016-02-24 DIAGNOSIS — I359 Nonrheumatic aortic valve disorder, unspecified: Secondary | ICD-10-CM | POA: Diagnosis present

## 2016-02-24 DIAGNOSIS — Z7901 Long term (current) use of anticoagulants: Secondary | ICD-10-CM | POA: Diagnosis not present

## 2016-02-24 DIAGNOSIS — I071 Rheumatic tricuspid insufficiency: Secondary | ICD-10-CM | POA: Diagnosis not present

## 2016-02-24 DIAGNOSIS — Z952 Presence of prosthetic heart valve: Secondary | ICD-10-CM

## 2016-02-24 DIAGNOSIS — E1165 Type 2 diabetes mellitus with hyperglycemia: Secondary | ICD-10-CM | POA: Insufficient documentation

## 2016-02-24 DIAGNOSIS — E785 Hyperlipidemia, unspecified: Secondary | ICD-10-CM | POA: Insufficient documentation

## 2016-02-24 DIAGNOSIS — Z954 Presence of other heart-valve replacement: Secondary | ICD-10-CM | POA: Diagnosis not present

## 2016-02-24 DIAGNOSIS — I2119 ST elevation (STEMI) myocardial infarction involving other coronary artery of inferior wall: Secondary | ICD-10-CM | POA: Insufficient documentation

## 2016-02-24 DIAGNOSIS — I340 Nonrheumatic mitral (valve) insufficiency: Secondary | ICD-10-CM | POA: Diagnosis not present

## 2016-02-24 DIAGNOSIS — I350 Nonrheumatic aortic (valve) stenosis: Secondary | ICD-10-CM | POA: Diagnosis not present

## 2016-02-24 DIAGNOSIS — Q231 Congenital insufficiency of aortic valve: Secondary | ICD-10-CM | POA: Diagnosis not present

## 2016-02-24 DIAGNOSIS — I119 Hypertensive heart disease without heart failure: Secondary | ICD-10-CM | POA: Insufficient documentation

## 2016-02-24 DIAGNOSIS — Z8679 Personal history of other diseases of the circulatory system: Secondary | ICD-10-CM

## 2016-02-24 LAB — ECHOCARDIOGRAM COMPLETE
AO mean calculated velocity dopler: 185 cm/s
AV Area VTI index: 1.22 cm2/m2
AV Area VTI: 2.19 cm2
AV Area mean vel: 2.29 cm2
AV Mean grad: 16 mmHg
AV Peak grad: 26 mmHg
AV VEL mean LVOT/AV: 0.43
AV area mean vel ind: 1.16 cm2/m2
AV peak Index: 1.11
AV pk vel: 257 cm/s
AV vel: 2.4
Ao pk vel: 0.41 m/s
Ao-asc: 27 cm
E decel time: 201 msec
E/e' ratio: 10
FS: 35 % (ref 28–44)
IVS/LV PW RATIO, ED: 1.17
LA ID, A-P, ES: 30 mm
LA diam end sys: 30 mm
LA diam index: 1.52 cm/m2
LA vol A4C: 28.5 ml
LA vol index: 17.4 mL/m2
LA vol: 34.3 mL
LV E/e' medial: 10
LV E/e'average: 10
LV PW d: 12.7 mm — AB (ref 0.6–1.1)
LV e' LATERAL: 10.6 cm/s
LVOT SV: 121 mL
LVOT VTI: 22.7 cm
LVOT area: 5.31 cm2
LVOT diameter: 26 mm
LVOT peak VTI: 0.45 cm
LVOT peak vel: 106 cm/s
MV Dec: 201
MV Peak grad: 4 mmHg
MV pk A vel: 84.2 m/s
MV pk E vel: 106 m/s
Reg peak vel: 221 cm/s
TAPSE: 18.5 mm
TDI e' lateral: 10.6
TDI e' medial: 8.38
TR max vel: 221 cm/s
VTI: 50.3 cm
Valve area index: 1.22
Valve area: 2.4 cm2

## 2016-02-24 LAB — POCT INR: INR: 2.6

## 2016-02-25 ENCOUNTER — Telehealth: Payer: Self-pay | Admitting: Cardiology

## 2016-02-25 NOTE — Telephone Encounter (Signed)
-----   Message from Marykay Lexavid W Harding, MD sent at 02/25/2016 10:09 AM EDT ----- Echo results: Good news:  Essentially normal echocardiogram and normal pump function and well-seated, normally functioning mechanical aortic valve.  No regional wall motion abnormalities = No signs to suggest heart attack.  Bryan Lemmaavid Harding, MD

## 2016-02-25 NOTE — Telephone Encounter (Signed)
Left message to call back in regards echo report Left message [facility]

## 2016-02-25 NOTE — Telephone Encounter (Signed)
Spoke to patient .  Echo, labs Results given. Verbalized understanding  Patient states he has been taking 40 mg of atorvastatin since surgery RN informed patient will discuss with Dr Herbie BaltimoreHarding concerning the change in medication and contact patient with response Patient verbalized understanding.

## 2016-02-25 NOTE — Telephone Encounter (Signed)
Follow-up     The pt is returning [place] call.

## 2016-02-25 NOTE — Progress Notes (Signed)
Quick Note:  Echo results: Good news:  Essentially normal echocardiogram and normal pump function and well-seated, normally functioning mechanical aortic valve.  No regional wall motion abnormalities = No signs to suggest heart attack.  Bryan Lemmaavid Harding, MD      ______

## 2016-02-25 NOTE — Telephone Encounter (Signed)
See other note Spoke to patient.  Echo , labs Result given . Verbalized understanding

## 2016-03-02 MED ORDER — ATORVASTATIN CALCIUM 20 MG PO TABS: 20.0000 mg | ORAL_TABLET | Freq: Every day | ORAL | Status: DC

## 2016-03-02 NOTE — Telephone Encounter (Signed)
SPOKE TO DR HARDING , PER DR HARDING,DECREASE TO 20 MG ATORVASTATIN  LEFT MESSAGE FOR PATIENT TO CALL BACK

## 2016-03-02 NOTE — Telephone Encounter (Signed)
INFORMATION GIVEN TO PATIENT VERBALIZED UNDERSTANDING NEW PRESCRIPTION E-SENT TO PHARMACY PATIENT AWARE.

## 2016-03-23 ENCOUNTER — Ambulatory Visit (INDEPENDENT_AMBULATORY_CARE_PROVIDER_SITE_OTHER): Payer: BC Managed Care – PPO | Admitting: Pharmacist Clinician (PhC)/ Clinical Pharmacy Specialist

## 2016-03-23 DIAGNOSIS — Z954 Presence of other heart-valve replacement: Secondary | ICD-10-CM

## 2016-03-23 DIAGNOSIS — Z7901 Long term (current) use of anticoagulants: Secondary | ICD-10-CM

## 2016-03-23 DIAGNOSIS — Z952 Presence of prosthetic heart valve: Secondary | ICD-10-CM

## 2016-03-23 LAB — POCT INR: INR: 2.8

## 2016-04-20 ENCOUNTER — Ambulatory Visit (INDEPENDENT_AMBULATORY_CARE_PROVIDER_SITE_OTHER): Payer: BC Managed Care – PPO | Admitting: Pharmacist Clinician (PhC)/ Clinical Pharmacy Specialist

## 2016-04-20 DIAGNOSIS — Z952 Presence of prosthetic heart valve: Secondary | ICD-10-CM

## 2016-04-20 DIAGNOSIS — Z954 Presence of other heart-valve replacement: Secondary | ICD-10-CM

## 2016-04-20 DIAGNOSIS — Z7901 Long term (current) use of anticoagulants: Secondary | ICD-10-CM

## 2016-04-20 LAB — POCT INR: INR: 1.8

## 2016-05-18 ENCOUNTER — Ambulatory Visit (INDEPENDENT_AMBULATORY_CARE_PROVIDER_SITE_OTHER): Payer: BC Managed Care – PPO | Admitting: Pharmacist Clinician (PhC)/ Clinical Pharmacy Specialist

## 2016-05-18 DIAGNOSIS — Z7901 Long term (current) use of anticoagulants: Secondary | ICD-10-CM

## 2016-05-18 DIAGNOSIS — Z952 Presence of prosthetic heart valve: Secondary | ICD-10-CM

## 2016-05-18 DIAGNOSIS — Z954 Presence of other heart-valve replacement: Secondary | ICD-10-CM

## 2016-05-18 LAB — POCT INR: INR: 2.5

## 2016-05-26 ENCOUNTER — Telehealth: Payer: Self-pay | Admitting: Pharmacist

## 2016-05-26 NOTE — Telephone Encounter (Signed)
Returned call to pt about dayquil and Coumadin.  LM message ok per DPR - avoid medications with alcohol and use for shortest length as possible. Instructed that if he needs for more than a day or two to call back.

## 2016-05-31 ENCOUNTER — Other Ambulatory Visit: Payer: Self-pay

## 2016-05-31 MED ORDER — CARVEDILOL 3.125 MG PO TABS: 3.1250 mg | ORAL_TABLET | Freq: Two times a day (BID) | ORAL | 3 refills | Status: DC

## 2016-06-08 ENCOUNTER — Other Ambulatory Visit: Payer: Self-pay | Admitting: Cardiology

## 2016-06-15 ENCOUNTER — Ambulatory Visit (INDEPENDENT_AMBULATORY_CARE_PROVIDER_SITE_OTHER): Payer: BC Managed Care – PPO | Admitting: Pharmacist Clinician (PhC)/ Clinical Pharmacy Specialist

## 2016-06-15 DIAGNOSIS — Z7901 Long term (current) use of anticoagulants: Secondary | ICD-10-CM

## 2016-06-15 DIAGNOSIS — Z952 Presence of prosthetic heart valve: Secondary | ICD-10-CM | POA: Diagnosis not present

## 2016-06-15 LAB — POCT INR: INR: 2.2

## 2016-07-14 ENCOUNTER — Telehealth: Payer: Self-pay | Admitting: *Deleted

## 2016-07-14 DIAGNOSIS — E785 Hyperlipidemia, unspecified: Secondary | ICD-10-CM

## 2016-07-14 DIAGNOSIS — Z5189 Encounter for other specified aftercare: Secondary | ICD-10-CM

## 2016-07-14 DIAGNOSIS — E781 Pure hyperglyceridemia: Secondary | ICD-10-CM

## 2016-07-14 NOTE — Telephone Encounter (Signed)
Mailed letter and labslip to patient 

## 2016-07-14 NOTE — Telephone Encounter (Signed)
-----   Message from Tobin ChadSharon V Dominyck Reser, RN sent at 02/16/2016  3:47 PM EDT ----- NEED LABS SENT FOR 6 MONTH F/U  DECREASE ATORVASTATIN 01/2016 LABS DUE IN NOV  25,2017  CMP,LIPID

## 2016-07-16 ENCOUNTER — Ambulatory Visit (INDEPENDENT_AMBULATORY_CARE_PROVIDER_SITE_OTHER): Payer: BC Managed Care – PPO | Admitting: Pharmacist

## 2016-07-16 DIAGNOSIS — Z952 Presence of prosthetic heart valve: Secondary | ICD-10-CM

## 2016-07-16 DIAGNOSIS — Z7901 Long term (current) use of anticoagulants: Secondary | ICD-10-CM

## 2016-07-16 LAB — POCT INR: INR: 2.4

## 2016-08-09 ENCOUNTER — Other Ambulatory Visit: Payer: Self-pay | Admitting: *Deleted

## 2016-08-10 MED ORDER — WARFARIN SODIUM 5 MG PO TABS: ORAL_TABLET | ORAL | 1 refills | Status: DC

## 2016-08-27 ENCOUNTER — Ambulatory Visit (INDEPENDENT_AMBULATORY_CARE_PROVIDER_SITE_OTHER): Payer: BC Managed Care – PPO | Admitting: Pharmacist

## 2016-08-27 DIAGNOSIS — Z952 Presence of prosthetic heart valve: Secondary | ICD-10-CM

## 2016-08-27 DIAGNOSIS — Z7901 Long term (current) use of anticoagulants: Secondary | ICD-10-CM | POA: Diagnosis not present

## 2016-08-27 LAB — POCT INR: INR: 2.6

## 2016-10-05 ENCOUNTER — Other Ambulatory Visit: Payer: Self-pay | Admitting: Cardiology

## 2016-10-05 NOTE — Telephone Encounter (Signed)
New Message    *STAT* If patient is at the pharmacy, call can be transferred to refill team.   1. Which medications need to be refilled? (please list name of each medication and dose if known) warfarin (COUMADIN) 5 MG tablet  2. Which pharmacy/location (including street and city if local pharmacy) is medication to be sent to? [facility] [place]  3. Do they need a 30 day or 90 day supply? 30 supply

## 2016-10-06 MED ORDER — WARFARIN SODIUM 5 MG PO TABS: ORAL_TABLET | ORAL | 1 refills | Status: DC

## 2016-10-07 ENCOUNTER — Other Ambulatory Visit: Payer: Self-pay

## 2016-10-07 MED ORDER — CARVEDILOL 3.125 MG PO TABS: 3.1250 mg | ORAL_TABLET | Freq: Two times a day (BID) | ORAL | 3 refills | Status: DC

## 2016-12-20 IMAGING — CR DG CHEST 1V PORT
1 series · 1 of 1 positions shown · non-contrast
Comparison: Portable chest x-ray 09 October, 2015.

CLINICAL DATA: Status post CABG on [DATE]

EXAM:
PORTABLE CHEST 1 VIEW

[AP]
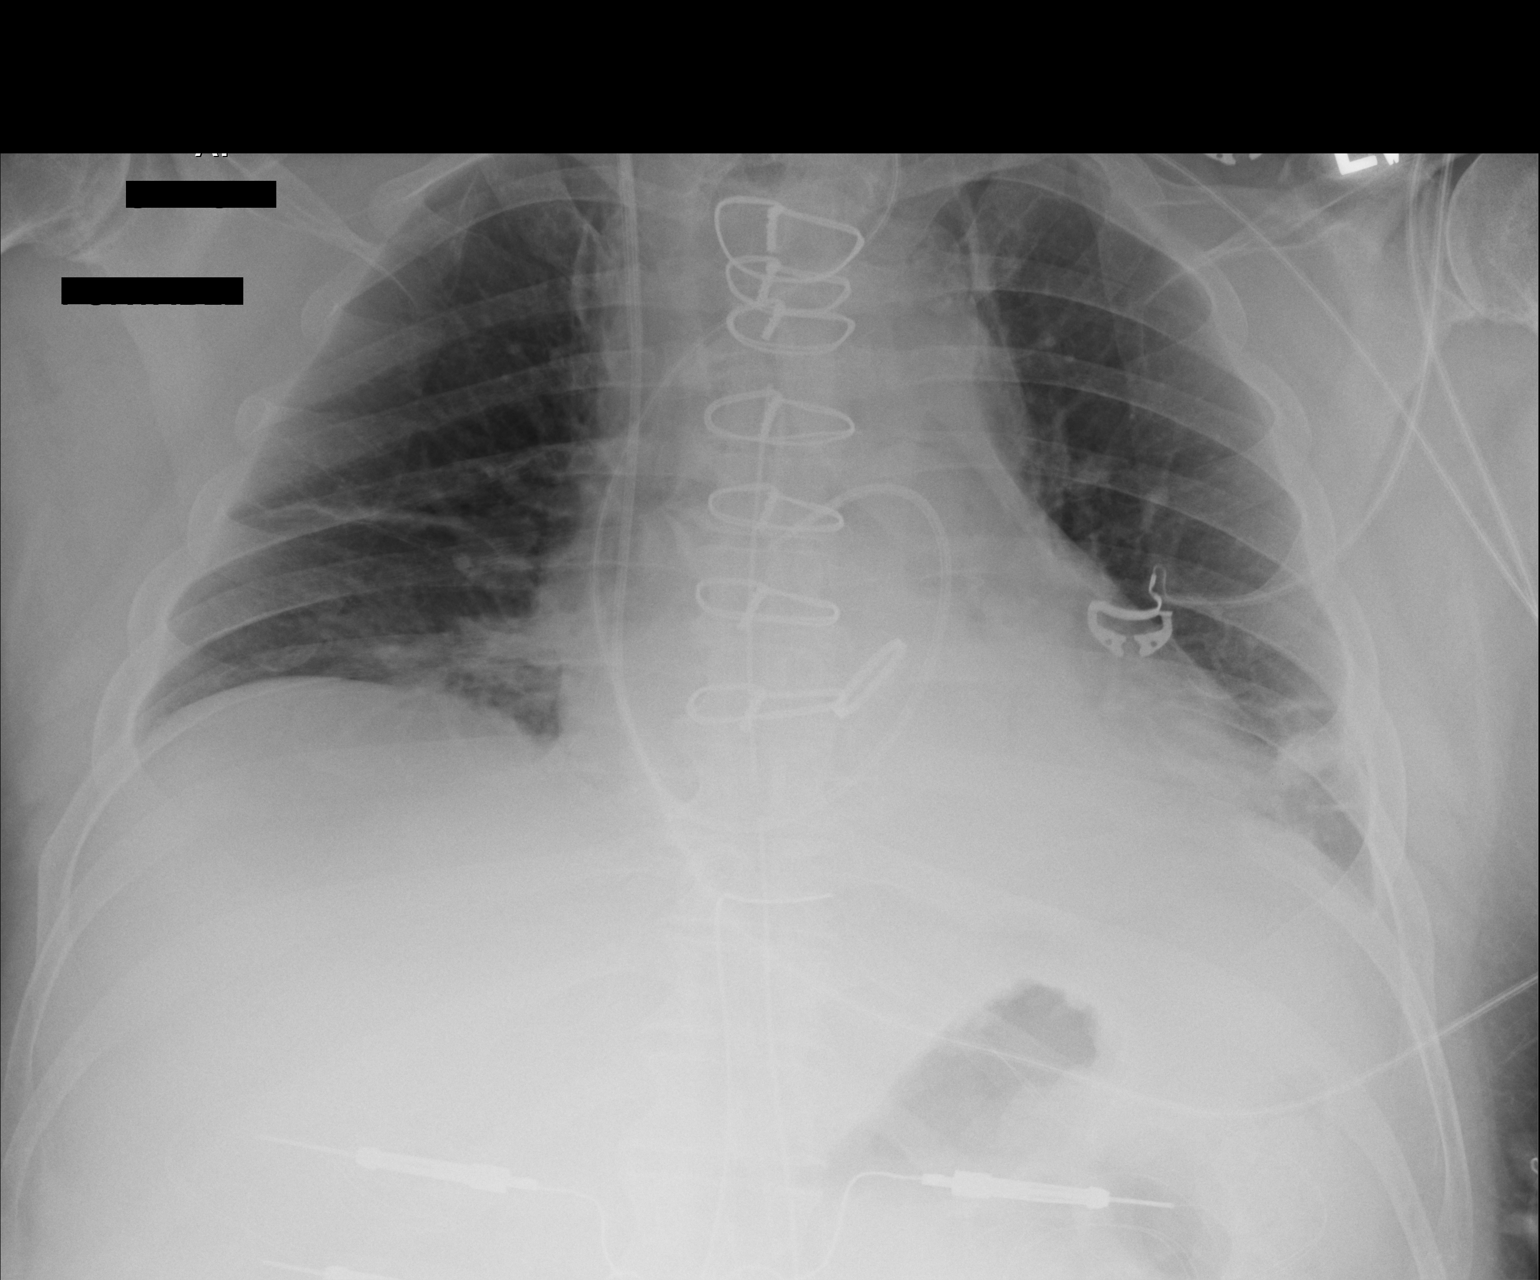

[1 of 1 positions shown; findings below may reference images not displayed]

FINDINGS: The trachea and esophagus have been extubated. The lungs are mildly
hypoinflated. There is mild bibasilar subsegmental atelectasis.
There is no significant pleural effusion and there is no
pneumothorax. The heart is mildly enlarged. A prosthetic aortic
valve ring is visible. The pulmonary vascularity is not engorged.
The pulmonary interstitial edema has resolved. The Swan-Ganz
catheter tip projects in the proximal right main pulmonary artery. A
mediastinal drain and a left lower medial chest tube remain. All 7
sternal wires are intact.
IMPRESSION: Interval resolution of interstitial edema. Persistent mild bibasilar
subsegmental atelectasis. No significant pleural effusion. The
remaining support tubes are in reasonable position.

## 2016-12-30 IMAGING — DX DG CHEST 2V
2 series · 2 of 2 positions shown · non-contrast
Comparison: Chest x-rays dated 10/11/2015 and 10/07/2015.

CLINICAL DATA: Status post recent heart valve and aorta surgery.
Newly developed fever.

EXAM:
CHEST  2 VIEW

[chest pa]
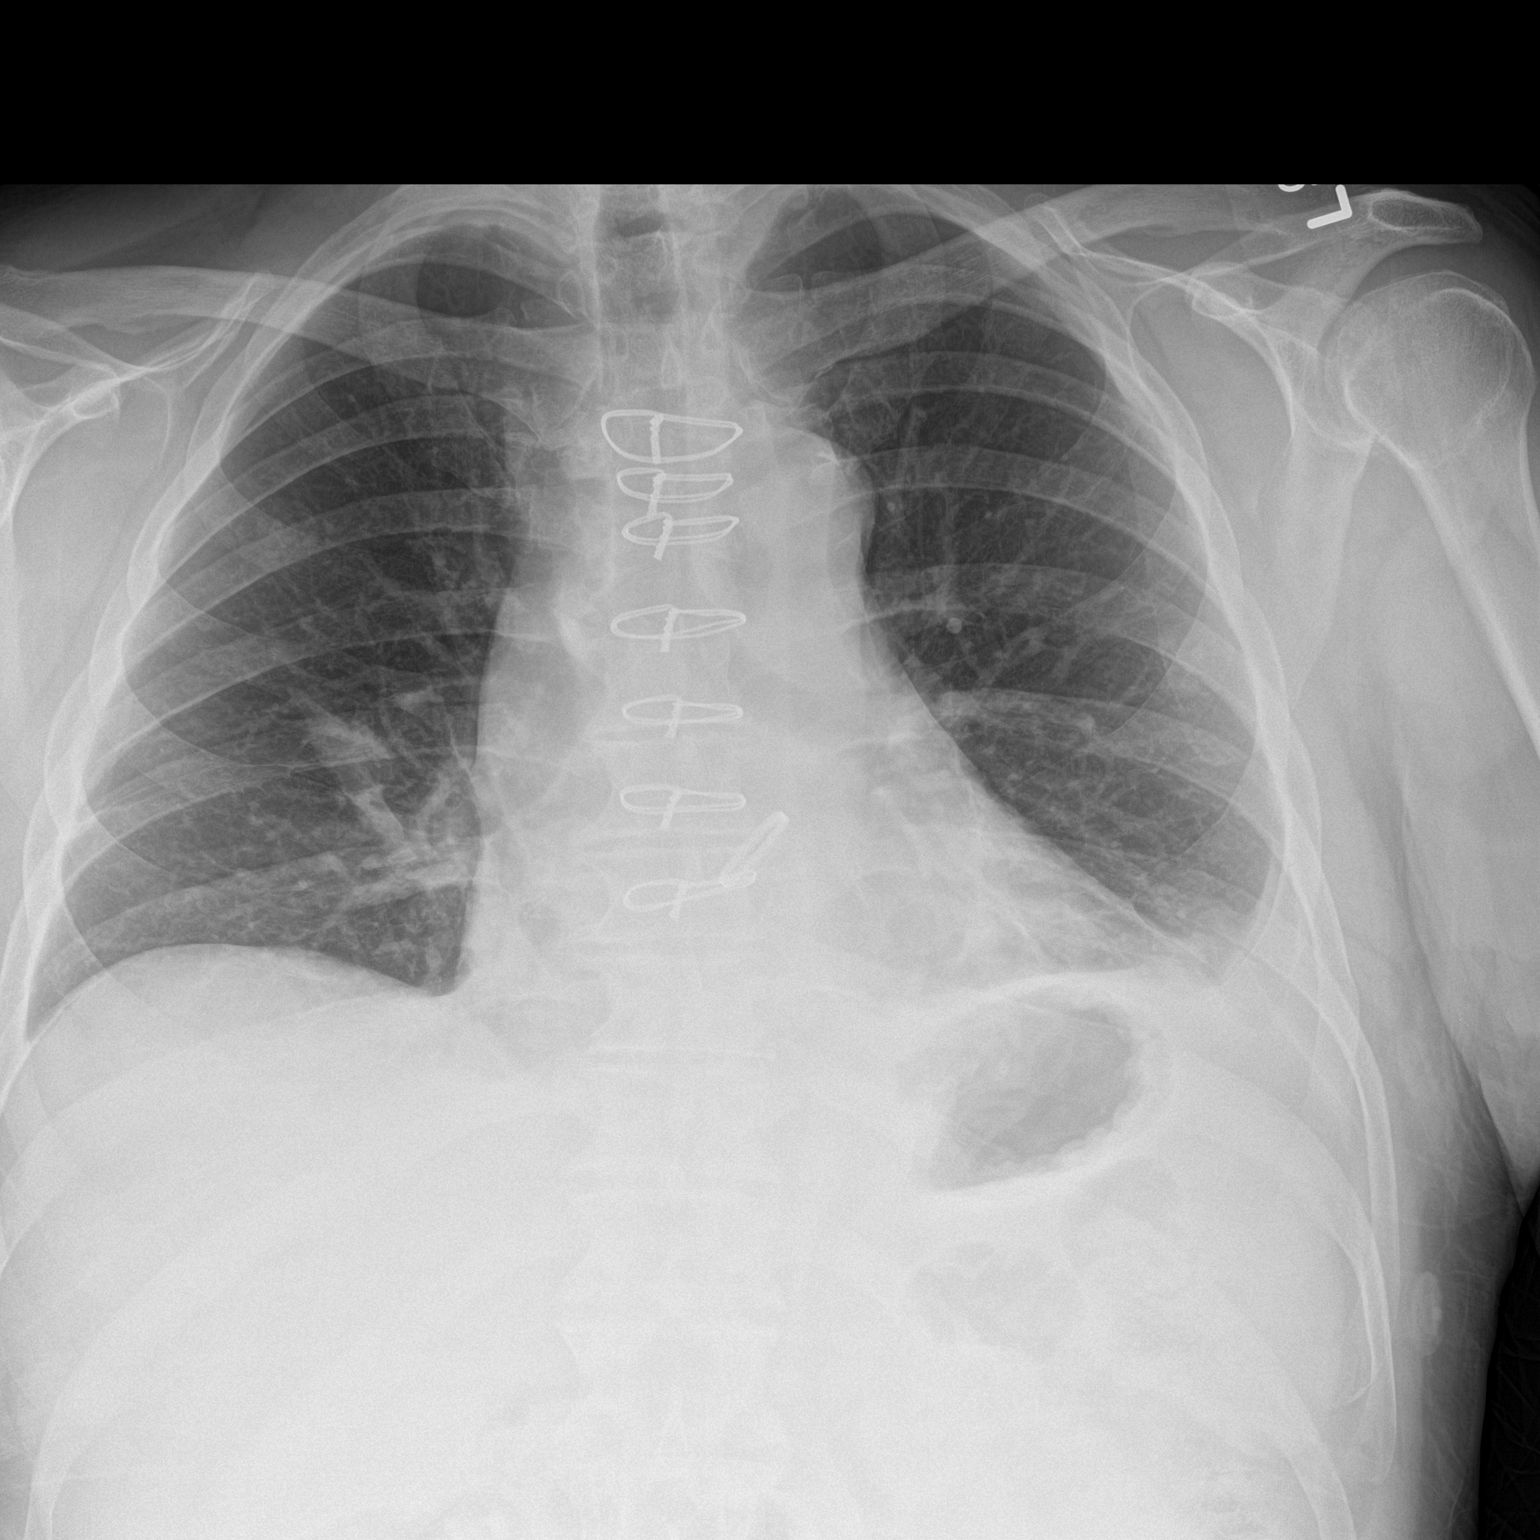

[chest lat]
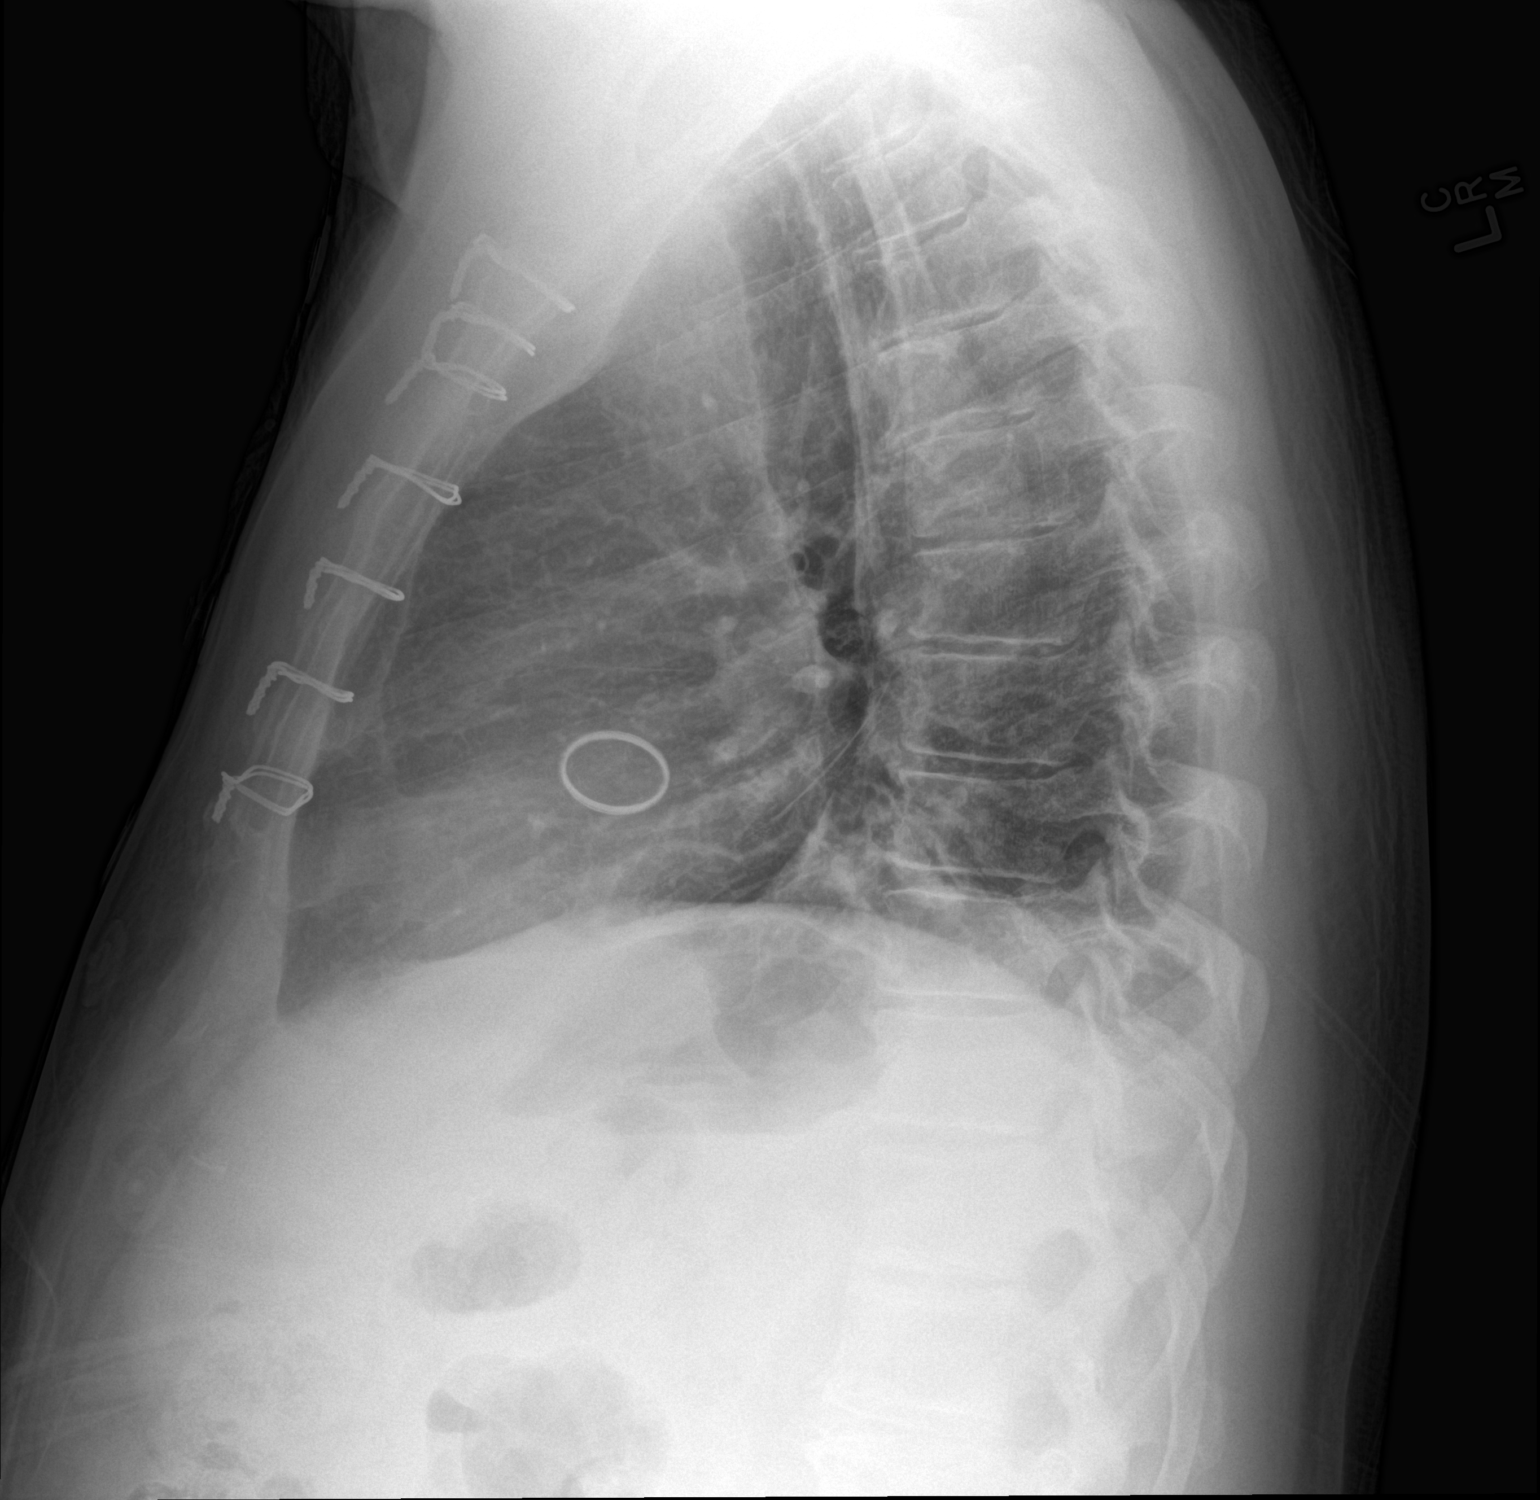

[2 of 2 positions shown; findings below may reference images not displayed]

FINDINGS: Persistent streaky opacities are seen within the lower lobes
bilaterally, not significantly changed, most suggestive of
atelectasis. Small left pleural effusion again noted. No new lung
findings. No pneumothorax.

Cardiomediastinal silhouette is stable in size and configuration.
Median sternotomy wires appear intact and stable alignment. Valvular
hardware is stable in position. No acute osseous abnormality. Soft
tissue structures about the chest are unremarkable.
IMPRESSION: Streaky/linear opacities at each lung base, most likely atelectasis.
Also persistent small left pleural effusion.

Postsurgical changes as above.

No convincing evidence of a developing pneumonia.

## 2017-01-22 IMAGING — CR DG CHEST 2V
2 series · 2 of 2 positions shown · non-contrast
Comparison: 10/20/2015.

CLINICAL DATA: Aortic valve replacement.

EXAM:
CHEST  2 VIEW

[w chest pa]
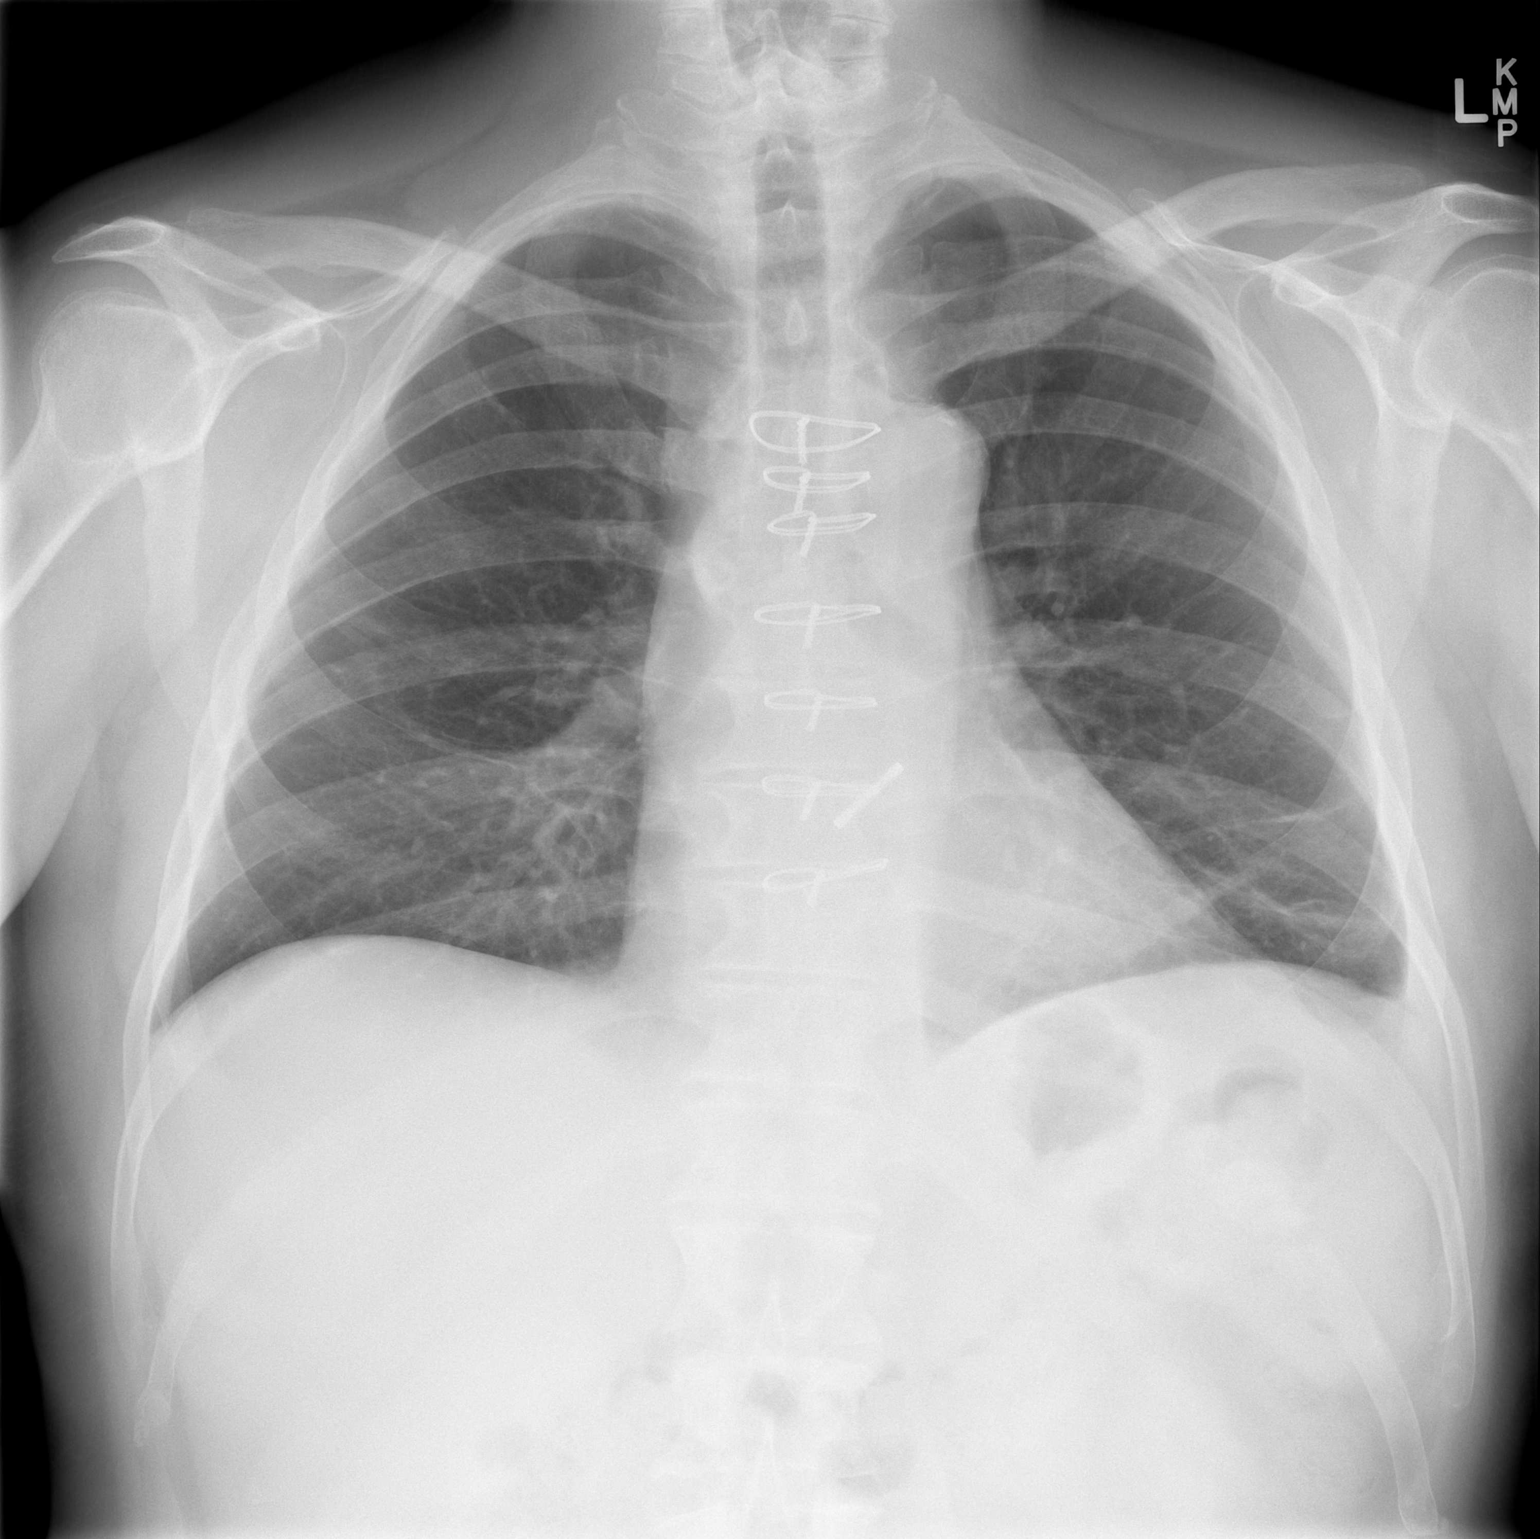

[w chest lat]
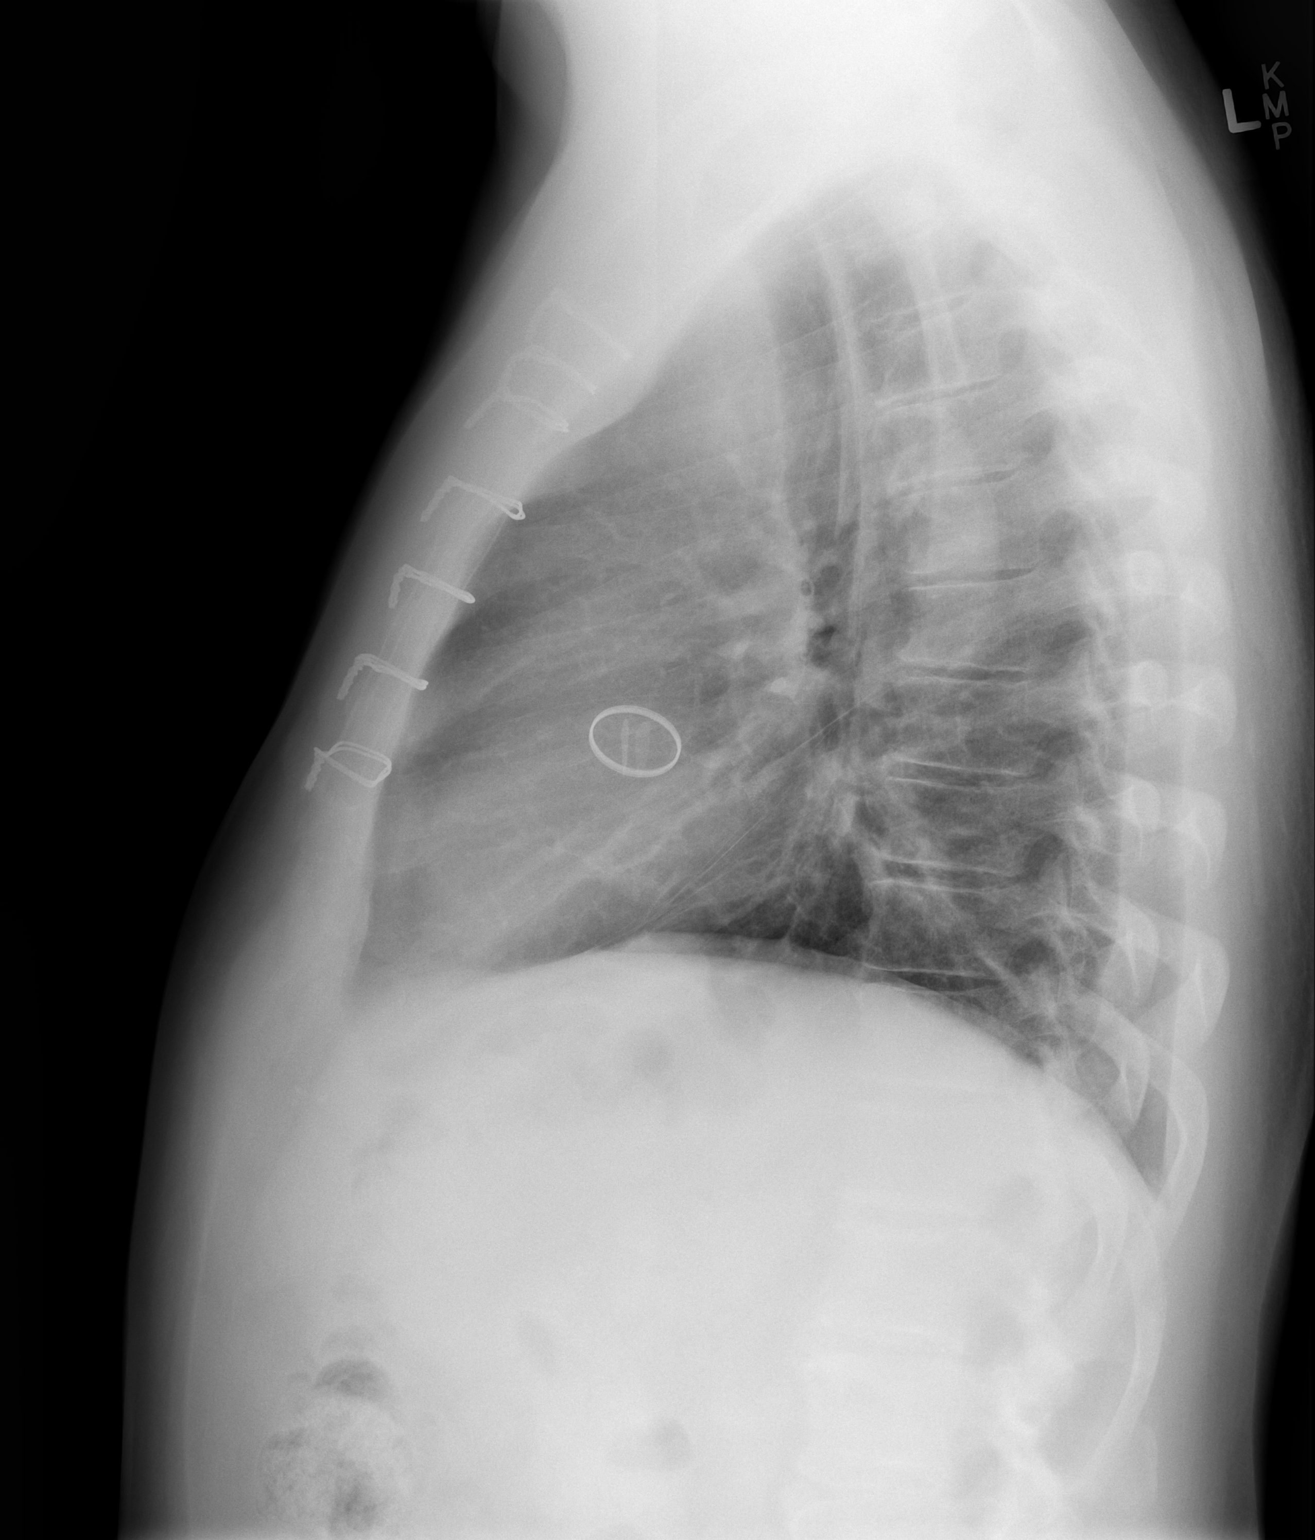

[2 of 2 positions shown; findings below may reference images not displayed]

FINDINGS: Prior aortic valve replacement. Borderline cardiomegaly with normal
pulmonary vascularity. Low lung volumes with mild basilar
atelectasis. Small left pleural effusion, improved from prior exam.
IMPRESSION: 1. Aortic valve replacement. Borderline cardiomegaly, no evidence of
congestive heart failure.

2. Mild bibasilar subsegmental atelectasis. Small left pleural
effusion. Findings have improved from prior exam .

## 2017-02-14 ENCOUNTER — Other Ambulatory Visit: Payer: Self-pay | Admitting: *Deleted

## 2017-02-14 MED ORDER — CARVEDILOL 3.125 MG PO TABS: 3.1250 mg | ORAL_TABLET | Freq: Two times a day (BID) | ORAL | 0 refills | Status: DC

## 2017-02-15 ENCOUNTER — Telehealth: Payer: Self-pay | Admitting: Pharmacist Clinician (PhC)/ Clinical Pharmacy Specialist

## 2017-02-15 MED ORDER — WARFARIN SODIUM 5 MG PO TABS: ORAL_TABLET | ORAL | 0 refills | Status: DC

## 2017-02-15 NOTE — Telephone Encounter (Signed)
Pt still determining if PCP is actually monitoring, will clarify tomorrow.

## 2017-03-23 ENCOUNTER — Other Ambulatory Visit: Payer: Self-pay

## 2017-03-23 MED ORDER — CARVEDILOL 3.125 MG PO TABS: 3.1250 mg | ORAL_TABLET | Freq: Two times a day (BID) | ORAL | 0 refills | Status: DC

## 2017-04-13 ENCOUNTER — Telehealth: Payer: Self-pay | Admitting: Pharmacist Clinician (PhC)/ Clinical Pharmacy Specialist

## 2017-04-13 NOTE — Telephone Encounter (Signed)
LMOM - who is monitoring INR

## 2017-04-14 ENCOUNTER — Other Ambulatory Visit: Payer: Self-pay

## 2017-04-18 ENCOUNTER — Other Ambulatory Visit: Payer: Self-pay | Admitting: *Deleted

## 2017-04-18 MED ORDER — CARVEDILOL 3.125 MG PO TABS: 3.1250 mg | ORAL_TABLET | Freq: Two times a day (BID) | ORAL | 0 refills | Status: DC

## 2017-04-19 ENCOUNTER — Ambulatory Visit: Payer: Self-pay | Admitting: Pharmacist Clinician (PhC)/ Clinical Pharmacy Specialist

## 2017-04-19 DIAGNOSIS — Z952 Presence of prosthetic heart valve: Secondary | ICD-10-CM

## 2017-04-19 DIAGNOSIS — Z7901 Long term (current) use of anticoagulants: Secondary | ICD-10-CM

## 2017-04-19 NOTE — Telephone Encounter (Signed)
LMOM x3

## 2017-05-18 ENCOUNTER — Other Ambulatory Visit: Payer: Self-pay

## 2017-05-18 MED ORDER — CARVEDILOL 3.125 MG PO TABS: 3.1250 mg | ORAL_TABLET | Freq: Two times a day (BID) | ORAL | 0 refills | Status: DC

## 2017-08-18 ENCOUNTER — Telehealth: Payer: Self-pay | Admitting: *Deleted

## 2017-08-18 NOTE — Telephone Encounter (Signed)
   Primary Cardiologist:No primary care provider on file.  Chart reviewed as part of pre-operative protocol coverage. Because of Dennis Bates's past medical history and time since last visit, he/she will require a follow-up visit in order to better assess preoperative cardiovascular risk. Not seen since 2017.  Pre-op covering staff: - Please schedule appointment and call patient to inform them. - Please contact requesting surgeon's office via preferred method (i.e, phone, fax) to inform them of need for appointment prior to surgery.  [name] [name], PA-C  08/18/2017, 5:11 PM

## 2017-08-18 NOTE — Telephone Encounter (Signed)
   [facility] Pre-operative Risk Assessment    Request for surgical clearance:  1. What type of surgery is being performed? RIGHT TOTAL KNEE ARTHROPLASTY   2. When is this surgery scheduled? UNDETERMINED   3. Are there any medications that need to be held prior to surgery and how long?WARFARIN=THEY ARE ASKING FOR DIRECTION   4. Practice name and name of physician performing surgery? [facility]   5. What is your office phone and fax number? PH=[phone]  FAX=[phone]   6. Anesthesia type (None, local, MAC, general) ? UNKNOWN   Fredia Beets 08/18/2017, 4:16 PM  _________________________________________________________________   (provider comments below)

## 2017-08-24 NOTE — Telephone Encounter (Signed)
Leave message for pt to call back 

## 2017-08-24 NOTE — Telephone Encounter (Signed)
F/u Message  Pt call to f/u on surgical clearance. Please call back to discuss

## 2017-08-25 NOTE — Telephone Encounter (Signed)
Follow up    Patient is returning a call to the nurse

## 2017-08-25 NOTE — Telephone Encounter (Signed)
Returned call to pt pre-op appt scheduled 09-01-17 pt informed to be here for appt @ 1130'ish for appt for the check-in prcess

## 2017-09-01 ENCOUNTER — Encounter: Payer: Self-pay | Admitting: Cardiology

## 2017-09-01 ENCOUNTER — Ambulatory Visit: Payer: BC Managed Care – PPO | Admitting: Cardiology

## 2017-09-01 VITALS — BP 116/78 | HR 76 | Ht 67.0 in | Wt 187.4 lb

## 2017-09-01 DIAGNOSIS — Z952 Presence of prosthetic heart valve: Secondary | ICD-10-CM

## 2017-09-01 DIAGNOSIS — Z01818 Encounter for other preprocedural examination: Secondary | ICD-10-CM

## 2017-09-01 DIAGNOSIS — I2119 ST elevation (STEMI) myocardial infarction involving other coronary artery of inferior wall: Secondary | ICD-10-CM

## 2017-09-01 DIAGNOSIS — I2510 Atherosclerotic heart disease of native coronary artery without angina pectoris: Secondary | ICD-10-CM

## 2017-09-01 DIAGNOSIS — Z9861 Coronary angioplasty status: Secondary | ICD-10-CM

## 2017-09-01 DIAGNOSIS — Q231 Congenital insufficiency of aortic valve: Secondary | ICD-10-CM

## 2017-09-01 DIAGNOSIS — Q230 Congenital stenosis of aortic valve: Secondary | ICD-10-CM

## 2017-09-01 DIAGNOSIS — E785 Hyperlipidemia, unspecified: Secondary | ICD-10-CM | POA: Diagnosis not present

## 2017-09-01 MED ORDER — CARVEDILOL 3.125 MG PO TABS: 3.1250 mg | ORAL_TABLET | Freq: Two times a day (BID) | ORAL | 3 refills | Status: DC

## 2017-09-01 NOTE — Progress Notes (Signed)
PCP: Rhetta MuraAlthisar, Henry, PA-C  Dr. Luiz BlareGraves ([facility])  Clinic Note: Chief Complaint  Patient presents with  . Medical Clearance  . Follow-up    2271-month  . Coronary Artery Disease    Inferior STEMI-PCI circumflex  . Cardiac Valve Problem    Status post mechanical AVR    HPI: Dennis Bates is a 56 y.o. male with a PMH below who presents today for ~2 month f/u for CAD & s/p AVR (Bentall procedure for ST Jude Mechanical AVR & replacement of Ascending Aorta for aneurysm) for Bicuspid Aortic Valve - Oct 09, 2015.  Cardiac History:  Inferior STEMI Jan 2016 --> 100% Cx --> PCI with DES; his diagnostic cath showed significant aortic valve gradient on pullback and severe aortic stenosis was confirmed by echo --severe bicuspid aortic valve stenosis  Following his 1 year of antiplatelets he was brought back for AVR via Bentall procedure by Dr. [name].  Preop cath showed widely patent stent and no other significant disease.  Dennis Conversehomas Spegal was last seen on Feb 05, 2016 he was doing very well at that time no complaints.  We ordered a follow-up echocardiogram.  I kept him on low-dose carvedilol, but his blood pressure did not allow for ACE inhibitor or ARB.  He continued on warfarin (INR followed by PCPs office along with lipids and diabetes).  Recent Hospitalizations: None  Studies Reviewed:   Echo 02/2016: mechanical: AVR without obstuction. LVEF improved to 65-70%.  Interval History: Dennis Bates presents today almost a year after he was supposed to have returned.  The main reason he is here is to get cardiac clearance for what is probably would be right knee arthroplasty.  He has not been seen in over 18 months, and has not had any cardiac issues.  His anticoagulation is managed by his PCP.  He has not had any chest tightness pressure with rest or exertion.  No PND, orthopnea or edema.  The only swelling he has is in his right knee. He has occasional flip flops but no significant rapid  irregular heartbeats or palpitations.  No lightheadedness or dizziness acute syncope/near degree, no TIA or amaurosis fugax. No claudication.  He indicates, that his PCP said it was okay for him to stop his beta-blocker several months ago, and so he is not been taking that at all.  He is now simply on aspirin and statin along with warfarin.  ROS: A comprehensive was performed. Review of Systems  Constitutional: Negative for chills, fever and malaise/fatigue.  HENT: Negative for nosebleeds.   Respiratory: Negative for cough, shortness of breath and wheezing.   Cardiovascular: Negative for claudication.  Gastrointestinal: Negative for blood in stool and heartburn.  Genitourinary: Negative for hematuria.  Musculoskeletal: Positive for joint pain (Significant right knee pain.  He has had injections, but now probably needs TKA). Negative for back pain, falls and myalgias.  Neurological: Negative for dizziness, loss of consciousness and weakness.  Psychiatric/Behavioral: Negative for depression and memory loss. The patient is not nervous/anxious and does not have insomnia.   All other systems reviewed and are negative.   Past Medical History:  Diagnosis Date  . Aortic stenosis due to bicuspid aortic valve    Severe aortic stenosis of bicuspid valve - s/pAVR with Bentall ( 09/2015)  . Bell's palsy   . CAD S/P percutaneous coronary angioplasty 09/20/2014   a. inferolat STEMI ->> LHC-Angio: Proximal/ostial LAD 20%, OM2 99% -->> PCI: 3mm x 16 mm Promus Premier DES to the OM2.(~3.5 mm)  .  Diabetes mellitus type 2, controlled, with complications (HCC)   . Hyperlipidemia   . Hypertension   . Hypertriglyceridemia   . Shortness of breath dyspnea   . ST elevation myocardial infarction (STEMI) of inferior wall (HCC) 09/20/2014   99 % occluded Cx-OM2 Promus DES 3.0 mm x 16 mm (3.5 mm)    Past Surgical History:  Procedure Laterality Date  . BENTALL PROCEDURE N/A 10/09/2015   Procedure: BENTALL  PROCEDURE (using a St Jude mechanical valve, size 23);  Surgeon: Alleen BorneBryan K Bartle, MD;  [facility];  Service: Open Heart Surgery;  Laterality: N/A;  CIRC ARREST  NEEDS RIGHT RADIAL A-LINE  . KNEE ARTHROSCOPY W/ ACL RECONSTRUCTION Right    90's  . LEFT HEART CATHETERIZATION WITH CORONARY ANGIOGRAM N/A 09/20/2014   Procedure: LEFT HEART CATHETERIZATION WITH CORONARY ANGIOGRAM;  Surgeon: Marykay Lexavid W Harding, MD;  [facility];  Service: Cardiovascular;  Laterality: N/A;  . PERCUTANEOUS CORONARY STENT INTERVENTION (PCI-S)  09/20/2014   PTCI to OM 2 with Promus Premier DES 3.0 mm x 16 mm (3.5 mm)  . RIGHT/LEFT HEART CATH AND CORONARY ANGIOGRAPHY  09/2015   Dr. Nicki Guadalajarahomas Kelly: Widely patent OM 2 stent.  Mild disease in RCA and LAD.Relatively normal right heart cath pressures.  Normal cardiac output.  . TEE WITHOUT CARDIOVERSION N/A 10/09/2015   Procedure: TRANSESOPHAGEAL ECHOCARDIOGRAM (TEE);  Surgeon: Alleen BorneBryan K Bartle, MD;  [facility];  Service: Open Heart Surgery;  Laterality: N/A;  . TRANSTHORACIC ECHOCARDIOGRAM  02/2016   Post AVR: mechanical: AVR without obstuction. LVEF improved to 65-70%.  Dennis Bates. TRANSTHORACIC ECHOCARDIOGRAM  January 2016, February2016   Probable bicuspid aortic valve: a. mod-sev by 2D ECHO 09/2014. AVA 0.9cm^2; b) 10/2014 Echo:. Severe AS Mn-Pk Gradient 41-71 mmHg, AVA ~0.79 cm2, EF 60-65%    Current Outpatient Medications on File Prior to Visit  Medication Sig Dispense Refill  . acetaminophen (TYLENOL) 325 MG tablet Take 2 tablets (650 mg total) by mouth every 6 (six) hours as needed for mild pain.    Marland Kitchen. aspirin EC 81 MG EC tablet Take 1 tablet (81 mg total) by mouth daily. (Patient taking differently: Take 81 mg by mouth every morning. )    . atorvastatin (LIPITOR) 20 MG tablet Take 1 tablet (20 mg total) by mouth daily. 30 tablet 11  . fenofibrate 160 MG tablet Take 1 tablet (160 mg total) by mouth daily. 30 tablet 9  . glipiZIDE (GLUCOTROL XL) 5 MG 24 hr tablet Take 1  tablet (5 mg total) by mouth daily with breakfast. 30 tablet 3  . metFORMIN (GLUCOPHAGE) 1000 MG tablet Take 1,000 mg by mouth 2 (two) times daily with a meal.     . warfarin (COUMADIN) 5 MG tablet Take 1.5 to 2 tablets by mouth daily or as directed by coumadin clinic 30 tablet 0   No current facility-administered medications on file prior to visit.       No Known Allergies   Social History   Socioeconomic History  . Marital status: Married    Spouse name: None  . Number of children: None  . Years of education: None  . Highest education level: None  Social Needs  . Financial resource strain: None  . Food insecurity - worry: None  . Food insecurity - inability: None  . Transportation needs - medical: None  . Transportation needs - non-medical: None  Occupational History  . None  Tobacco Use  . Smoking status: Never Smoker  . Smokeless tobacco: Former  User    Types: Chew  Substance and Sexual Activity  . Alcohol use: Yes    Alcohol/week: 0.0 oz    Comment: socially  . Drug use: No  . Sexual activity: None  Other Topics Concern  . None  Social History Narrative  . None    Family History  Problem Relation Age of Onset  . Heart disease Mother   . CAD Neg Hx        per wife    Wt Readings from Last 3 Encounters:  09/01/17 187 lb 6.4 oz (85 kg)  02/11/16 185 lb 3 oz (84 kg)  02/05/16 188 lb (85.3 kg)    PHYSICAL EXAM BP 116/78   Pulse 76   Ht 5\' 7"  (1.702 m)   Wt 187 lb 6.4 oz (85 kg)   BMI 29.35 kg/m   Physical Exam  Constitutional: He is oriented to person, place, and time. He appears well-developed and well-nourished. No distress.  HENT:  Head: Normocephalic and atraumatic.  Eyes: EOM are normal.  Neck: Normal range of motion. Neck supple. No JVD present.  Cardiovascular: Normal rate and regular rhythm.  Occasional extrasystoles are present. Exam reveals no gallop.  Normal S1.  Crisp metallic S2 with soft SEM murmur.  Pulmonary/Chest: Effort normal  and breath sounds normal. No respiratory distress. He has no wheezes.  Well-healed sternotomy scar  Abdominal: Soft. Bowel sounds are normal. He exhibits no distension. There is no tenderness.  Musculoskeletal: Normal range of motion. He exhibits no edema.  Neurological: He is alert and oriented to person, place, and time.  Skin: Skin is warm and dry. No rash noted. No erythema.  Psychiatric: His behavior is normal. Judgment and thought content normal.  Nursing note and vitals reviewed.    Adult ECG Report Normal sinus rhythm, rate 76 bpm.  Cannot exclude inferior MI, age undetermined.  Otherwise normal axis, intervals and durations.  Stable EKG.  Other studies Reviewed: Additional studies/ records that were reviewed today include:  Recent Labs:  -Labs are followed by PCP.  Not available.   ASSESSMENT / PLAN: Problem List Items Addressed This Visit    Aortic stenosis due to bicuspid aortic valve (Chronic)    Somewhat surreptitious finding of severe aortic stenosis with bicuspid valve in the setting of a STEMI.  He did fine over the year while on dual antiplatelets.  Subsequently underwent AVR with Bentall procedure. Follow-up echo back in June 2017 5 months post showed improved ejection fraction and stable valve. Would probably recommend following up the valve every couple years.  May be in 2017.      Relevant Medications   carvedilol (COREG) 3.125 MG tablet   CAD S/P p DES PCI to the Circumflex-OM 2 (Chronic)    Significant thrombus during initial MI with successful stent placement.  The stent was widely patent in January 2017.  Otherwise minimal disease elsewhere. He has not had any follow-up anginal symptoms with rest or exertion. He is still on aspirin and statin.  Not on a beta-blocker.  I think with his blood pressure it is probably okay not to be on beta-blocker, however with potential upcoming surgery I think it would be reasonable to restart beta-blocker simply for cardiac  protection. We will start him back gradually on carvedilol 3.125 mg twice daily.  I will give him a prescription for 1 month and 3 refills.  My preference would be that he stays on it, but  since he has done well for over  a year not on it, I think he is probably okay not being on it -all data not withstanding.      Relevant Medications   carvedilol (COREG) 3.125 MG tablet   Dyslipidemia, goal LDL below 70 (Chronic)    Remains on fenofibrate and atorvastatin.  No myalgias.  Unfortunately, I do not have any labs to review.  The thought was that he could potentially back off his statin and he is now on 20 mg.  I suspect that that was part of the move.   For now my recommendation will be to continue statin and fibroid as his PCP is followed.  Having lab review would be nice.      Relevant Medications   carvedilol (COREG) 3.125 MG tablet   Hx of prosthetic aortic valve replacement -mechanical; along with aortic root (Bental) (Chronic)    Seems to be doing very well status post mechanical AVR --stable findings on follow-up echo. Valve sounds good on exam today.  No significant murmur. Remains on warfarin, followed by PCP. Perioperatively for his knee surgery, this is the major issue.  His warfarin needs to be bridged with Lovenox.  I will and this is the assistance of my clinical pharmacist to help provide assistance with how this is done in the protocol.  However, since the INRs have been followed by the PCPs office, the responsibility for making these orders would fall to his PCPs office.      Preoperative clearance - Primary    Qais presents today for preop eval for his knee surgery.  He is totally asymptomatic from a cardiac standpoint.  He has diabetes but is not on insulin.  He has no active angina or heart failure symptoms.  He has normal renal function and no history of stroke. He has no clinical risk factors for low risk surgery.  He would be a low risk patient for low risk surgery.   In the absence of any active symptoms, I would not follow-up with any studies prior to his surgery.  I will simply delay a necessary surgery.  I will however start him on low-dose beta-blocker for cardioprotective benefit.  He will then continue this for at least a month postop.  I would prefer him to continue beyond, but will defer to his PCP since it was stopped by his PCP last time.  Recommendation: Proceed to the OR without further cardiac evaluation.  He is low risk patient for low risk surgery.    Restart carvedilol 3.125 mg twice daily.  We will need to bridge warfarin with Lovenox perioperatively.  [facility] clinical pharmacy team will help provide assistance with standard protocol, but will defer to his PCPs office for providing prescriptions and management/following testing  --       ST elevation myocardial infarction (STEMI) of inferolateral wall (HCC) (Chronic)    Initial presentation back in 2016 with heavily thrombosed second marginal branch treated with aspiration thrombectomy and stenting.  Minimal residual disease elsewhere.  Now only on aspirin and statin plus fibroid.  His blood pressure is probably fine and he probably is okay without ACE inhibitor any which would otherwise be indicated for his diabetes.  He is not even on a beta-blocker now.  I am going to restart a beta-blocker for cardioprotection perioperatively.      Relevant Medications   carvedilol (COREG) 3.125 MG tablet      Complex, detailed visit and the patient have not seen in over 18 months.  He has  several chronic medical problems and is also now here for preoperative evaluation.  Clinical decision making alone is high as his history taking.  Current medicines are reviewed at length with the patient today. (+/- concerns) None The following changes have been made: None Studies Ordered:   No orders of the defined types were placed in this encounter.    Patient Instructions  MEDICATION   ----  start carvedilol 3.125 mg  One tablet twice a day , the first 2 days take 1/2 tablet twice a day. Continue after surgery until you see primary    DISCUSS WITH primary doctor  - LOVENOX BRIDGING FOR KNEE SURGERY ( primary will give you directions)    OKAY FOR KNEE SURGERY WITH DR GRAVES - LOVENOX BRIDING ,AND TAKE CARVEDILOL 3.125 MG TWICE A DAY    Your physician wants you to follow-up in 12 MONTHS WITH DR HARDING. You will receive a reminder letter in the mail two months in advance. If you don't receive a letter, please call our office to schedule the follow-up appointment.   If you need a refill on your cardiac medications before your next appointment, please call your pharmacy.    Bryan Lemma, M.D., M.S. Interventional Cardiologist   Pager # [phone] Phone # [phone] [address] [place], [place] [address]

## 2017-09-01 NOTE — Patient Instructions (Addendum)
MEDICATION   ---- start carvedilol 3.125 mg  One tablet twice a day , the first 2 days take 1/2 tablet twice a day. Continue after surgery until you see primary    DISCUSS WITH primary doctor  - LOVENOX BRIDGING FOR KNEE SURGERY ( primary will give you directions)    OKAY FOR KNEE SURGERY WITH DR GRAVES - LOVENOX BRIDING ,AND TAKE CARVEDILOL 3.125 MG TWICE A DAY    Your physician wants you to follow-up in 12 MONTHS WITH DR HARDING. You will receive a reminder letter in the mail two months in advance. If you don't receive a letter, please call our office to schedule the follow-up appointment.   If you need a refill on your cardiac medications before your next appointment, please call your pharmacy.

## 2017-09-02 ENCOUNTER — Encounter: Payer: Self-pay | Admitting: Cardiology

## 2017-09-02 DIAGNOSIS — Z01818 Encounter for other preprocedural examination: Secondary | ICD-10-CM | POA: Insufficient documentation

## 2017-09-02 NOTE — Assessment & Plan Note (Signed)
Significant thrombus during initial MI with successful stent placement.  The stent was widely patent in January 2017.  Otherwise minimal disease elsewhere. He has not had any follow-up anginal symptoms with rest or exertion. He is still on aspirin and statin.  Not on a beta-blocker.  I think with his blood pressure it is probably okay not to be on beta-blocker, however with potential upcoming surgery I think it would be reasonable to restart beta-blocker simply for cardiac protection. We will start him back gradually on carvedilol 3.125 mg twice daily.  I will give him a prescription for 1 month and 3 refills.  My preference would be that he stays on it, but  since he has done well for over a year not on it, I think he is probably okay not being on it -all data not withstanding.

## 2017-09-02 NOTE — Assessment & Plan Note (Signed)
Initial presentation back in 2016 with heavily thrombosed second marginal branch treated with aspiration thrombectomy and stenting.  Minimal residual disease elsewhere.  Now only on aspirin and statin plus fibroid.  His blood pressure is probably fine and he probably is okay without ACE inhibitor any which would otherwise be indicated for his diabetes.  He is not even on a beta-blocker now.  I am going to restart a beta-blocker for cardioprotection perioperatively.

## 2017-09-02 NOTE — Assessment & Plan Note (Signed)
Remains on fenofibrate and atorvastatin.  No myalgias.  Unfortunately, I do not have any labs to review.  The thought was that he could potentially back off his statin and he is now on 20 mg.  I suspect that that was part of the move.   For now my recommendation will be to continue statin and fibroid as his PCP is followed.  Having lab review would be nice.

## 2017-09-02 NOTE — Assessment & Plan Note (Signed)
Somewhat surreptitious finding of severe aortic stenosis with bicuspid valve in the setting of a STEMI.  He did fine over the year while on dual antiplatelets.  Subsequently underwent AVR with Bentall procedure. Follow-up echo back in June 2017 5 months post showed improved ejection fraction and stable valve. Would probably recommend following up the valve every couple years.  May be in 2017.

## 2017-09-02 NOTE — Assessment & Plan Note (Signed)
Seems to be doing very well status post mechanical AVR --stable findings on follow-up echo. Valve sounds good on exam today.  No significant murmur. Remains on warfarin, followed by PCP. Perioperatively for his knee surgery, this is the major issue.  His warfarin needs to be bridged with Lovenox.  I will and this is the assistance of my clinical pharmacist to help provide assistance with how this is done in the protocol.  However, since the INRs have been followed by the PCPs office, the responsibility for making these orders would fall to his PCPs office.

## 2017-09-02 NOTE — Assessment & Plan Note (Addendum)
Dennis Bates presents today for preop eval for his knee surgery.  He is totally asymptomatic from a cardiac standpoint.  He has diabetes but is not on insulin.  He has no active angina or heart failure symptoms.  He has normal renal function and no history of stroke. He has no clinical risk factors for low risk surgery.  He would be a low risk patient for low risk surgery.  In the absence of any active symptoms, I would not follow-up with any studies prior to his surgery.  I will simply delay a necessary surgery.  I will however start him on low-dose beta-blocker for cardioprotective benefit.  He will then continue this for at least a month postop.  I would prefer him to continue beyond, but will defer to his PCP since it was stopped by his PCP last time.  Recommendation: Proceed to the OR without further cardiac evaluation.  He is low risk patient for low risk surgery.    Restart carvedilol 3.125 mg twice daily.  We will need to bridge warfarin with Lovenox perioperatively.  [facility] clinical pharmacy team will help provide assistance with standard protocol, but will defer to his PCPs office for providing prescriptions and management/following testing  --

## 2017-09-08 NOTE — Addendum Note (Signed)
Addended by: Armen PickupWYSOR, Niley Helbig T on: 09/08/2017 04:29 PM   Modules accepted: Orders

## 2017-11-18 ENCOUNTER — Other Ambulatory Visit: Payer: Self-pay | Admitting: Orthopedic Surgery

## 2017-12-02 ENCOUNTER — Other Ambulatory Visit: Payer: Self-pay | Admitting: Pharmacist Clinician (PhC)/ Clinical Pharmacy Specialist

## 2017-12-02 ENCOUNTER — Ambulatory Visit (INDEPENDENT_AMBULATORY_CARE_PROVIDER_SITE_OTHER): Payer: BC Managed Care – PPO | Admitting: Pharmacist Clinician (PhC)/ Clinical Pharmacy Specialist

## 2017-12-02 DIAGNOSIS — Z952 Presence of prosthetic heart valve: Secondary | ICD-10-CM

## 2017-12-02 DIAGNOSIS — Z7901 Long term (current) use of anticoagulants: Secondary | ICD-10-CM

## 2017-12-02 LAB — PROTIME-INR: INR: 1.6 — AB (ref <=1.1)

## 2017-12-02 MED ORDER — ENOXAPARIN SODIUM 80 MG/0.8ML ~~LOC~~ SOLN: 80.0000 mg | Freq: Two times a day (BID) | SUBCUTANEOUS | 1 refills | Status: DC

## 2017-12-02 NOTE — Patient Instructions (Signed)
Description   When you get home after surgery, restart enoxaparin injections every 12 hours for 3-4 days and take warfarin 10 mg each day.  Call Kristin/Raquel when you get discharged.    Kristin/Raquel at [phone]

## 2017-12-05 ENCOUNTER — Other Ambulatory Visit (HOSPITAL_COMMUNITY): Payer: Self-pay | Admitting: Emergency Medicine

## 2017-12-05 NOTE — Progress Notes (Signed)
LOV/cardiology clearance Dr Bryan Lemmaavid Harding 09-01-17 epic  PTINR 12-02-17 epic  EKG 09-01-17 epic   ECHO 02-24-16 epic

## 2017-12-05 NOTE — Patient Instructions (Addendum)
Dennis Bates  12/05/2017   Your procedure is scheduled on: 12-09-17   Report to [facility] Main  Entrance    Report to admitting at 7:30AM     Call this number if you have problems the morning of surgery [phone]     Remember: Do not eat food or drink liquids :After Midnight.     Take these medicines the morning of surgery with A SIP OF WATER: carvedilol, fenofibrate, atorvastatin, tylenol if needed                                You may not have any metal on your body including hair pins and              piercings  Do not wear jewelry, make-up, lotions, powders or perfumes, deodorant                    Men may shave face and neck.   Do not bring valuables to the hospital. [facility] IS NOT             RESPONSIBLE   FOR VALUABLES.  Contacts, dentures or bridgework may not be worn into surgery.  Leave suitcase in the car. After surgery it may be brought to your room.                 Please read over the following fact sheets you were given: _____________________________________________________________________            How to Manage Your Diabetes Before and After Surgery  Why is it important to control my blood sugar before and after surgery? . Improving blood sugar levels before and after surgery helps healing and can limit problems. . A way of improving blood sugar control is eating a healthy diet by: o  Eating less sugar and carbohydrates o  Increasing activity/exercise o  Talking with your doctor about reaching your blood sugar goals . High blood sugars (greater than 180 mg/dL) can raise your risk of infections and slow your recovery, so you will need to focus on controlling your diabetes during the weeks before surgery. . Make sure that the doctor who takes care of your diabetes knows about your planned surgery including the date and location.  How do I manage my blood sugar before surgery? . Check your blood sugar at least 4 times a  day, starting 2 days before surgery, to make sure that the level is not too high or low. o Check your blood sugar the morning of your surgery when you wake up and every 2 hours until you get to the Short Stay unit. . If your blood sugar is less than 70 mg/dL, you will need to treat for low blood sugar: o Do not take insulin. o Treat a low blood sugar (less than 70 mg/dL) with  cup of clear juice (cranberry or apple), 4 glucose tablets, OR glucose gel. o Recheck blood sugar in 15 minutes after treatment (to make sure it is greater than 70 mg/dL). If your blood sugar is not greater than 70 mg/dL on recheck, call [phone] for further instructions. . Report your blood sugar to the short stay nurse when you get to Short Stay.  . If you are admitted to the hospital after surgery: o Your blood sugar will be checked by the staff  and you will probably be given insulin after surgery (instead of oral diabetes medicines) to make sure you have good blood sugar levels. o The goal for blood sugar control after surgery is 80-180 mg/dL.   WHAT DO I DO ABOUT MY DIABETES MEDICATION?   . THE DAY BEFORE SURGERY,  o Take Metformin as normal o Take Glipizide as normal o Ask your Primary Care provider for instructions for Dapagliflozin Propanediol Marcelline Deist(Farxiga)        . THE MORNING OF SURGERY DO NOT TAKE ANY DIABETIC MEDICATIONS !    Patient Signature:  Date:   Nurse Signature:  Date:   Reviewed and Endorsed by [facility] Patient Education Committee, August 2015   [facility] - Preparing [facility] Before surgery, you can play an important role.  Because skin is not sterile, your skin needs to be as free of germs as possible.  You can reduce the number of germs on your skin by washing with CHG (chlorahexidine gluconate) soap before surgery.  CHG is an antiseptic cleaner which kills germs and bonds with the skin to continue killing germs even after washing. Please DO NOT use if you have an allergy to  CHG or antibacterial soaps.  If your skin becomes reddened/irritated stop using the CHG and inform your nurse when you arrive at Short Stay. Do not shave (including legs and underarms) for at least 48 hours prior to the first CHG shower.  You may shave your face/neck. Please follow these instructions carefully:  1.  Shower with CHG Soap the night before surgery and the  morning of Surgery.  2.  If you choose to wash your hair, wash your hair first as usual with your  normal  shampoo.  3.  After you shampoo, rinse your hair and body thoroughly to remove the  shampoo.                           4.  Use CHG as you would any other liquid soap.  You can apply chg directly  to the skin and wash                       Gently with a scrungie or clean washcloth.  5.  Apply the CHG Soap to your body ONLY FROM THE NECK DOWN.   Do not use on face/ open                           Wound or open sores. Avoid contact with eyes, ears mouth and genitals (private parts).                       Wash face,  Genitals (private parts) with your normal soap.             6.  Wash thoroughly, paying special attention to the area where your surgery  will be performed.  7.  Thoroughly rinse your body with warm water from the neck down.  8.  DO NOT shower/wash with your normal soap after using and rinsing off  the CHG Soap.                9.  Pat yourself dry with a clean towel.            10.  Wear clean pajamas.  11.  Place clean sheets on your bed the night of your first shower and do not  sleep with pets. Day of Surgery : Do not apply any lotions/deodorants the morning of surgery.  Please wear clean clothes to the hospital/surgery center.  FAILURE TO FOLLOW THESE INSTRUCTIONS MAY RESULT IN THE CANCELLATION OF YOUR SURGERY PATIENT SIGNATURE_________________________________  NURSE SIGNATURE__________________________________  ________________________________________________________________________   Adam Phenix  An incentive spirometer is a tool that can help keep your lungs clear and active. This tool measures how well you are filling your lungs with each breath. Taking long deep breaths may help reverse or decrease the chance of developing breathing (pulmonary) problems (especially infection) following:  A long period of time when you are unable to move or be active. BEFORE THE PROCEDURE   If the spirometer includes an indicator to show your best effort, your nurse or respiratory therapist will set it to a desired goal.  If possible, sit up straight or lean slightly forward. Try not to slouch.  Hold the incentive spirometer in an upright position. INSTRUCTIONS FOR USE  1. Sit on the edge of your bed if possible, or sit up as far as you can in bed or on a chair. 2. Hold the incentive spirometer in an upright position. 3. Breathe out normally. 4. Place the mouthpiece in your mouth and seal your lips tightly around it. 5. Breathe in slowly and as deeply as possible, raising the piston or the ball toward the top of the column. 6. Hold your breath for 3-5 seconds or for as long as possible. Allow the piston or ball to fall to the bottom of the column. 7. Remove the mouthpiece from your mouth and breathe out normally. 8. Rest for a few seconds and repeat Steps 1 through 7 at least 10 times every 1-2 hours when you are awake. Take your time and take a few normal breaths between deep breaths. 9. The spirometer may include an indicator to show your best effort. Use the indicator as a goal to work toward during each repetition. 10. After each set of 10 deep breaths, practice coughing to be sure your lungs are clear. If you have an incision (the cut made at the time of surgery), support your incision when coughing by placing a pillow or rolled up towels firmly against it. Once you are able to get out of bed, walk around indoors and cough well. You may stop using the incentive spirometer when  instructed by your caregiver.  RISKS AND COMPLICATIONS  Take your time so you do not get dizzy or light-headed.  If you are in pain, you may need to take or ask for pain medication before doing incentive spirometry. It is harder to take a deep breath if you are having pain. AFTER USE  Rest and breathe slowly and easily.  It can be helpful to keep track of a log of your progress. Your caregiver can provide you with a simple table to help with this. If you are using the spirometer at home, follow these instructions: [place] IF:   You are having difficultly using the spirometer.  You have trouble using the spirometer as often as instructed.  Your pain medication is not giving enough relief while using the spirometer.  You develop fever of 100.5 F (38.1 C) or higher. SEEK IMMEDIATE MEDICAL CARE IF:   You cough up bloody sputum that had not been present before.  You develop fever of 102 F (38.9 C)  or greater.  You develop worsening pain at or near the incision site. MAKE SURE YOU:   Understand these instructions.  Will watch your condition.  Will get help right away if you are not doing well or get worse. Document Released: 01/10/2007 Document Revised: 11/22/2011 Document Reviewed: 03/13/2007 [facility] Patient Information 2014 [place], [place].   ________________________________________________________________________

## 2017-12-07 ENCOUNTER — Encounter (HOSPITAL_COMMUNITY): Payer: Self-pay

## 2017-12-07 ENCOUNTER — Ambulatory Visit (HOSPITAL_COMMUNITY)
Admission: RE | Admit: 2017-12-07 | Discharge: 2017-12-07 | Disposition: A | Discharge status: Home / Self Care | Payer: BC Managed Care – PPO | Source: Ambulatory Visit | Attending: Orthopedic Surgery | Admitting: Orthopedic Surgery

## 2017-12-07 ENCOUNTER — Encounter (HOSPITAL_COMMUNITY)
Admission: RE | Admit: 2017-12-07 | Discharge: 2017-12-07 | Disposition: A | Discharge status: Home / Self Care | Payer: BC Managed Care – PPO | Source: Ambulatory Visit | Attending: Orthopedic Surgery | Admitting: Orthopedic Surgery

## 2017-12-07 ENCOUNTER — Other Ambulatory Visit: Payer: Self-pay

## 2017-12-07 DIAGNOSIS — Z9861 Coronary angioplasty status: Secondary | ICD-10-CM | POA: Diagnosis not present

## 2017-12-07 DIAGNOSIS — Z79899 Other long term (current) drug therapy: Secondary | ICD-10-CM | POA: Diagnosis not present

## 2017-12-07 DIAGNOSIS — E119 Type 2 diabetes mellitus without complications: Secondary | ICD-10-CM | POA: Diagnosis not present

## 2017-12-07 DIAGNOSIS — E785 Hyperlipidemia, unspecified: Secondary | ICD-10-CM | POA: Diagnosis not present

## 2017-12-07 DIAGNOSIS — Z01818 Encounter for other preprocedural examination: Secondary | ICD-10-CM

## 2017-12-07 DIAGNOSIS — G510 Bell's palsy: Secondary | ICD-10-CM | POA: Diagnosis not present

## 2017-12-07 DIAGNOSIS — Z01812 Encounter for preprocedural laboratory examination: Secondary | ICD-10-CM | POA: Insufficient documentation

## 2017-12-07 DIAGNOSIS — Z7984 Long term (current) use of oral hypoglycemic drugs: Secondary | ICD-10-CM | POA: Diagnosis not present

## 2017-12-07 DIAGNOSIS — Z01810 Encounter for preprocedural cardiovascular examination: Secondary | ICD-10-CM | POA: Insufficient documentation

## 2017-12-07 DIAGNOSIS — Z7901 Long term (current) use of anticoagulants: Secondary | ICD-10-CM | POA: Diagnosis not present

## 2017-12-07 DIAGNOSIS — M1711 Unilateral primary osteoarthritis, right knee: Secondary | ICD-10-CM | POA: Diagnosis present

## 2017-12-07 DIAGNOSIS — I10 Essential (primary) hypertension: Secondary | ICD-10-CM | POA: Diagnosis not present

## 2017-12-07 DIAGNOSIS — I2510 Atherosclerotic heart disease of native coronary artery without angina pectoris: Secondary | ICD-10-CM | POA: Diagnosis not present

## 2017-12-07 DIAGNOSIS — Z87891 Personal history of nicotine dependence: Secondary | ICD-10-CM | POA: Diagnosis not present

## 2017-12-07 DIAGNOSIS — E781 Pure hyperglyceridemia: Secondary | ICD-10-CM | POA: Diagnosis not present

## 2017-12-07 DIAGNOSIS — I252 Old myocardial infarction: Secondary | ICD-10-CM | POA: Diagnosis not present

## 2017-12-07 DIAGNOSIS — Z8249 Family history of ischemic heart disease and other diseases of the circulatory system: Secondary | ICD-10-CM | POA: Diagnosis not present

## 2017-12-07 DIAGNOSIS — Z9889 Other specified postprocedural states: Secondary | ICD-10-CM | POA: Diagnosis not present

## 2017-12-07 DIAGNOSIS — Z952 Presence of prosthetic heart valve: Secondary | ICD-10-CM | POA: Diagnosis not present

## 2017-12-07 DIAGNOSIS — Z955 Presence of coronary angioplasty implant and graft: Secondary | ICD-10-CM | POA: Diagnosis not present

## 2017-12-07 LAB — URINALYSIS, ROUTINE W REFLEX MICROSCOPIC
Bilirubin Urine: NEGATIVE
Glucose, UA: >=500 mg/dL — AB
Hgb urine dipstick: NEGATIVE
Ketones, ur: NEGATIVE mg/dL
Leukocytes, UA: NEGATIVE
Nitrite: NEGATIVE
Protein, ur: NEGATIVE mg/dL
Specific Gravity, Urine: 1.026 (ref 1.005–1.030)
Squamous Epithelial / LPF: NONE SEEN
pH: 5 (ref 5.0–8.0)

## 2017-12-07 LAB — COMPREHENSIVE METABOLIC PANEL
ALT: 60 U/L (ref 17–63)
AST: 77 U/L — ABNORMAL HIGH (ref 15–41)
Albumin: 4.9 g/dL (ref 3.5–5.0)
Alkaline Phosphatase: 32 U/L — ABNORMAL LOW (ref 38–126)
Anion gap: 10 (ref 5–15)
BUN: 14 mg/dL (ref 6–20)
CO2: 25 mmol/L (ref 22–32)
Calcium: 9.9 mg/dL (ref 8.9–10.3)
Chloride: 102 mmol/L (ref 101–111)
Creatinine, Ser: 1.22 mg/dL (ref 0.61–1.24)
GFR calc Af Amer: >60 mL/min (ref >60)
GFR calc non Af Amer: >60 mL/min (ref >60)
Glucose, Bld: 135 mg/dL — ABNORMAL HIGH (ref 65–99)
Potassium: 5.5 mmol/L — ABNORMAL HIGH (ref 3.5–5.1)
Sodium: 137 mmol/L (ref 135–145)
Total Bilirubin: 1.2 mg/dL (ref 0.3–1.2)
Total Protein: 7.2 g/dL (ref 6.5–8.1)

## 2017-12-07 LAB — CBC WITH DIFFERENTIAL/PLATELET
Basophils Absolute: 0 10*3/uL (ref 0.0–0.1)
Basophils Relative: 0 %
Eosinophils Absolute: 0.1 10*3/uL (ref 0.0–0.7)
Eosinophils Relative: 2 %
HCT: 44.4 % (ref 39.0–52.0)
Hemoglobin: 15 g/dL (ref 13.0–17.0)
Lymphocytes Relative: 25 %
Lymphs Abs: 1.3 10*3/uL (ref 0.7–4.0)
MCH: 29 pg (ref 26.0–34.0)
MCHC: 33.8 g/dL (ref 30.0–36.0)
MCV: 85.9 fL (ref 78.0–100.0)
Monocytes Absolute: 0.4 10*3/uL (ref 0.1–1.0)
Monocytes Relative: 8 %
Neutro Abs: 3.4 10*3/uL (ref 1.7–7.7)
Neutrophils Relative %: 65 %
Platelets: 313 10*3/uL (ref 150–400)
RBC: 5.17 MIL/uL (ref 4.22–5.81)
RDW: 13.5 % (ref 11.5–15.5)
WBC: 5.2 10*3/uL (ref 4.0–10.5)

## 2017-12-07 LAB — GLUCOSE, CAPILLARY: Glucose-Capillary: 129 mg/dL — ABNORMAL HIGH (ref 65–99)

## 2017-12-07 LAB — SURGICAL PCR SCREEN
MRSA, PCR: NEGATIVE
Staphylococcus aureus: POSITIVE — AB

## 2017-12-07 LAB — APTT: aPTT: 31 seconds (ref 24–36)

## 2017-12-07 LAB — ABO/RH: ABO/RH(D): AB POS

## 2017-12-07 LAB — HEMOGLOBIN A1C
Hgb A1c MFr Bld: 7.6 % — ABNORMAL HIGH (ref 4.8–5.6)
Mean Plasma Glucose: 171.42 mg/dL

## 2017-12-07 LAB — PROTIME-INR
INR: 0.91
Prothrombin Time: 12.2 seconds (ref 11.4–15.2)

## 2017-12-07 NOTE — Progress Notes (Signed)
Urinalysis and CMP routed via epic to surgeon Dr Luiz BlareGraves

## 2017-12-09 ENCOUNTER — Encounter (HOSPITAL_COMMUNITY): Payer: Self-pay | Admitting: Registered Nurse

## 2017-12-09 ENCOUNTER — Ambulatory Visit (HOSPITAL_COMMUNITY): Payer: BC Managed Care – PPO | Admitting: Registered Nurse

## 2017-12-09 ENCOUNTER — Observation Stay (HOSPITAL_COMMUNITY)
Admission: AD | Admit: 2017-12-09 | Discharge: 2017-12-12 | DRG: 470 | Disposition: A | Discharge status: Home / Self Care | Payer: BC Managed Care – PPO | Source: Ambulatory Visit | Attending: Orthopedic Surgery | Admitting: Orthopedic Surgery

## 2017-12-09 ENCOUNTER — Encounter (HOSPITAL_COMMUNITY)
Admission: AD | Disposition: A | Discharge status: Home / Self Care | Payer: Self-pay | Source: Ambulatory Visit | Attending: Orthopedic Surgery

## 2017-12-09 ENCOUNTER — Other Ambulatory Visit: Payer: Self-pay

## 2017-12-09 DIAGNOSIS — Z8249 Family history of ischemic heart disease and other diseases of the circulatory system: Secondary | ICD-10-CM | POA: Insufficient documentation

## 2017-12-09 DIAGNOSIS — G510 Bell's palsy: Secondary | ICD-10-CM | POA: Insufficient documentation

## 2017-12-09 DIAGNOSIS — M1711 Unilateral primary osteoarthritis, right knee: Secondary | ICD-10-CM | POA: Diagnosis not present

## 2017-12-09 DIAGNOSIS — Z969 Presence of functional implant, unspecified: Secondary | ICD-10-CM

## 2017-12-09 DIAGNOSIS — E785 Hyperlipidemia, unspecified: Secondary | ICD-10-CM | POA: Insufficient documentation

## 2017-12-09 DIAGNOSIS — Z87891 Personal history of nicotine dependence: Secondary | ICD-10-CM | POA: Insufficient documentation

## 2017-12-09 DIAGNOSIS — M1712 Unilateral primary osteoarthritis, left knee: Secondary | ICD-10-CM | POA: Diagnosis present

## 2017-12-09 DIAGNOSIS — E119 Type 2 diabetes mellitus without complications: Secondary | ICD-10-CM | POA: Insufficient documentation

## 2017-12-09 DIAGNOSIS — I10 Essential (primary) hypertension: Secondary | ICD-10-CM | POA: Insufficient documentation

## 2017-12-09 DIAGNOSIS — E781 Pure hyperglyceridemia: Secondary | ICD-10-CM | POA: Insufficient documentation

## 2017-12-09 DIAGNOSIS — I2510 Atherosclerotic heart disease of native coronary artery without angina pectoris: Secondary | ICD-10-CM | POA: Insufficient documentation

## 2017-12-09 DIAGNOSIS — I252 Old myocardial infarction: Secondary | ICD-10-CM | POA: Insufficient documentation

## 2017-12-09 DIAGNOSIS — Z79899 Other long term (current) drug therapy: Secondary | ICD-10-CM | POA: Insufficient documentation

## 2017-12-09 DIAGNOSIS — Z955 Presence of coronary angioplasty implant and graft: Secondary | ICD-10-CM | POA: Insufficient documentation

## 2017-12-09 DIAGNOSIS — Z7984 Long term (current) use of oral hypoglycemic drugs: Secondary | ICD-10-CM | POA: Insufficient documentation

## 2017-12-09 DIAGNOSIS — Z7901 Long term (current) use of anticoagulants: Secondary | ICD-10-CM | POA: Insufficient documentation

## 2017-12-09 DIAGNOSIS — Z9889 Other specified postprocedural states: Secondary | ICD-10-CM | POA: Insufficient documentation

## 2017-12-09 DIAGNOSIS — Z952 Presence of prosthetic heart valve: Secondary | ICD-10-CM | POA: Insufficient documentation

## 2017-12-09 HISTORY — PX: TOTAL KNEE ARTHROPLASTY: SHX125

## 2017-12-09 LAB — GLUCOSE, CAPILLARY
Glucose-Capillary: 147 mg/dL — ABNORMAL HIGH (ref 65–99)
Glucose-Capillary: 157 mg/dL — ABNORMAL HIGH (ref 65–99)
Glucose-Capillary: 286 mg/dL — ABNORMAL HIGH (ref 65–99)
Glucose-Capillary: 178 mg/dL — ABNORMAL HIGH (ref 65–99)

## 2017-12-09 LAB — TYPE AND SCREEN
ABO/RH(D): AB POS
Antibody Screen: NEGATIVE

## 2017-12-09 SURGERY — ARTHROPLASTY, KNEE, TOTAL
Anesthesia: Spinal | Site: Knee | Laterality: Right

## 2017-12-09 MED ORDER — DEXAMETHASONE SODIUM PHOSPHATE 10 MG/ML IJ SOLN
10.0000 mg | Freq: Two times a day (BID) | INTRAMUSCULAR | Status: AC
  Administered 2017-12-09 – 2017-12-10 (×3): 10 mg via INTRAVENOUS
  Filled 2017-12-09 (×3): qty 1

## 2017-12-09 MED ORDER — ROPIVACAINE HCL 7.5 MG/ML IJ SOLN
INTRAMUSCULAR | Status: DC | PRN
  Administered 2017-12-09: 20 mL via PERINEURAL

## 2017-12-09 MED ORDER — WARFARIN SODIUM 5 MG PO TABS
10.0000 mg | ORAL_TABLET | Freq: Every day | ORAL | Status: DC
  Administered 2017-12-09 – 2017-12-11 (×3): 10 mg via ORAL
  Filled 2017-12-09 (×3): qty 2

## 2017-12-09 MED ORDER — ASPIRIN EC 81 MG PO TBEC
81.0000 mg | DELAYED_RELEASE_TABLET | Freq: Every morning | ORAL | Status: DC
  Administered 2017-12-10 – 2017-12-12 (×3): 81 mg via ORAL
  Filled 2017-12-09 (×3): qty 1

## 2017-12-09 MED ORDER — BUPIVACAINE LIPOSOME 1.3 % IJ SUSP
20.0000 mL | Freq: Once | INTRAMUSCULAR | Status: DC
  Filled 2017-12-09: qty 20

## 2017-12-09 MED ORDER — CHLORHEXIDINE GLUCONATE 4 % EX LIQD: 60.0000 mL | Freq: Once | CUTANEOUS | Status: DC

## 2017-12-09 MED ORDER — BUPIVACAINE LIPOSOME 1.3 % IJ SUSP
INTRAMUSCULAR | Status: DC | PRN
  Administered 2017-12-09: 20 mL

## 2017-12-09 MED ORDER — CEFAZOLIN SODIUM-DEXTROSE 2-4 GM/100ML-% IV SOLN
2.0000 g | INTRAVENOUS | Status: AC
  Administered 2017-12-09: 2 g via INTRAVENOUS
  Filled 2017-12-09: qty 100

## 2017-12-09 MED ORDER — FENTANYL CITRATE (PF) 100 MCG/2ML IJ SOLN
INTRAMUSCULAR | Status: AC
  Filled 2017-12-09: qty 2

## 2017-12-09 MED ORDER — OXYCODONE-ACETAMINOPHEN 5-325 MG PO TABS: 1.0000 | ORAL_TABLET | Freq: Four times a day (QID) | ORAL | 0 refills | Status: DC | PRN

## 2017-12-09 MED ORDER — PROPOFOL 10 MG/ML IV BOLUS
INTRAVENOUS | Status: AC
  Filled 2017-12-09: qty 20

## 2017-12-09 MED ORDER — LACTATED RINGERS IV SOLN
INTRAVENOUS | Status: DC
  Administered 2017-12-09 (×3): via INTRAVENOUS

## 2017-12-09 MED ORDER — MIDAZOLAM HCL 2 MG/2ML IJ SOLN
INTRAMUSCULAR | Status: AC
  Filled 2017-12-09: qty 2

## 2017-12-09 MED ORDER — TIZANIDINE HCL 2 MG PO TABS: 2.0000 mg | ORAL_TABLET | Freq: Three times a day (TID) | ORAL | 0 refills | Status: DC | PRN

## 2017-12-09 MED ORDER — DEXAMETHASONE SODIUM PHOSPHATE 10 MG/ML IJ SOLN
INTRAMUSCULAR | Status: AC
  Filled 2017-12-09: qty 1

## 2017-12-09 MED ORDER — BUPIVACAINE-EPINEPHRINE (PF) 0.5% -1:200000 IJ SOLN
INTRAMUSCULAR | Status: AC
  Filled 2017-12-09: qty 30

## 2017-12-09 MED ORDER — ONDANSETRON HCL 4 MG PO TABS
4.0000 mg | ORAL_TABLET | Freq: Four times a day (QID) | ORAL | Status: DC | PRN
  Administered 2017-12-10 – 2017-12-11 (×4): 4 mg via ORAL
  Filled 2017-12-09 (×4): qty 1

## 2017-12-09 MED ORDER — GLIPIZIDE ER 5 MG PO TB24
5.0000 mg | ORAL_TABLET | Freq: Every day | ORAL | Status: DC
  Administered 2017-12-10 – 2017-12-12 (×3): 5 mg via ORAL
  Filled 2017-12-09 (×3): qty 1

## 2017-12-09 MED ORDER — DEXAMETHASONE SODIUM PHOSPHATE 10 MG/ML IJ SOLN
INTRAMUSCULAR | Status: DC | PRN
  Administered 2017-12-09: 10 mg via INTRAVENOUS

## 2017-12-09 MED ORDER — SODIUM CHLORIDE 0.9 % IR SOLN
Status: DC | PRN
  Administered 2017-12-09: 1000 mL

## 2017-12-09 MED ORDER — SODIUM CHLORIDE 0.9 % IR SOLN
Status: DC | PRN
Start: 2017-12-09 — End: 2017-12-09
  Administered 2017-12-09: 1000 mL

## 2017-12-09 MED ORDER — METHOCARBAMOL 1000 MG/10ML IJ SOLN
500.0000 mg | Freq: Four times a day (QID) | INTRAVENOUS | Status: DC | PRN
  Filled 2017-12-09: qty 5

## 2017-12-09 MED ORDER — ONDANSETRON HCL 4 MG/2ML IJ SOLN
INTRAMUSCULAR | Status: AC
  Filled 2017-12-09: qty 2

## 2017-12-09 MED ORDER — FENTANYL CITRATE (PF) 100 MCG/2ML IJ SOLN
INTRAMUSCULAR | Status: DC | PRN
  Administered 2017-12-09: 50 ug via INTRAVENOUS

## 2017-12-09 MED ORDER — ACETAMINOPHEN 325 MG PO TABS: 325.0000 mg | ORAL_TABLET | Freq: Four times a day (QID) | ORAL | Status: DC | PRN

## 2017-12-09 MED ORDER — TRANEXAMIC ACID 1000 MG/10ML IV SOLN
2000.0000 mg | Freq: Once | INTRAVENOUS | Status: AC
  Administered 2017-12-09: 2000 mg via TOPICAL
  Filled 2017-12-09: qty 20

## 2017-12-09 MED ORDER — TRANEXAMIC ACID 1000 MG/10ML IV SOLN
1000.0000 mg | Freq: Once | INTRAVENOUS | Status: AC
  Administered 2017-12-09: 1000 mg via INTRAVENOUS
  Filled 2017-12-09: qty 1100

## 2017-12-09 MED ORDER — MEPERIDINE HCL 50 MG/ML IJ SOLN
6.2500 mg | INTRAMUSCULAR | Status: DC | PRN
Start: 2017-12-09 — End: 2017-12-09

## 2017-12-09 MED ORDER — POLYETHYLENE GLYCOL 3350 17 G PO PACK: 17.0000 g | PACK | Freq: Every day | ORAL | Status: DC | PRN

## 2017-12-09 MED ORDER — PROPOFOL 10 MG/ML IV BOLUS
INTRAVENOUS | Status: AC
  Filled 2017-12-09: qty 60

## 2017-12-09 MED ORDER — PANTOPRAZOLE SODIUM 40 MG PO TBEC
40.0000 mg | DELAYED_RELEASE_TABLET | Freq: Every day | ORAL | Status: DC
  Administered 2017-12-09 – 2017-12-12 (×4): 40 mg via ORAL
  Filled 2017-12-09 (×5): qty 1

## 2017-12-09 MED ORDER — FENOFIBRATE 160 MG PO TABS
160.0000 mg | ORAL_TABLET | Freq: Every day | ORAL | Status: DC
  Administered 2017-12-10 – 2017-12-12 (×3): 160 mg via ORAL
  Filled 2017-12-09 (×3): qty 1

## 2017-12-09 MED ORDER — METFORMIN HCL 500 MG PO TABS
1000.0000 mg | ORAL_TABLET | Freq: Two times a day (BID) | ORAL | Status: DC
  Administered 2017-12-09 – 2017-12-12 (×6): 1000 mg via ORAL
  Filled 2017-12-09 (×6): qty 2

## 2017-12-09 MED ORDER — MAGNESIUM CITRATE PO SOLN: 1.0000 | Freq: Once | ORAL | Status: DC | PRN

## 2017-12-09 MED ORDER — CARVEDILOL 3.125 MG PO TABS
3.1250 mg | ORAL_TABLET | Freq: Once | ORAL | Status: AC
  Administered 2017-12-09: 3.125 mg via ORAL
  Filled 2017-12-09: qty 1

## 2017-12-09 MED ORDER — MIDAZOLAM HCL 2 MG/2ML IJ SOLN: 0.5000 mg | Freq: Once | INTRAMUSCULAR | Status: DC | PRN

## 2017-12-09 MED ORDER — PROPOFOL 500 MG/50ML IV EMUL
INTRAVENOUS | Status: DC | PRN
  Administered 2017-12-09: 100 ug/kg/min via INTRAVENOUS

## 2017-12-09 MED ORDER — OXYCODONE HCL 5 MG PO TABS
5.0000 mg | ORAL_TABLET | ORAL | Status: DC | PRN
  Administered 2017-12-09 (×2): 5 mg via ORAL
  Administered 2017-12-10 (×2): 10 mg via ORAL
  Administered 2017-12-10: 07:00:00 5 mg via ORAL
  Administered 2017-12-10 – 2017-12-11 (×3): 10 mg via ORAL
  Administered 2017-12-11: 5 mg via ORAL
  Administered 2017-12-11 – 2017-12-12 (×4): 10 mg via ORAL
  Filled 2017-12-09: qty 1
  Filled 2017-12-09: qty 2
  Filled 2017-12-09 (×2): qty 1
  Filled 2017-12-09: qty 2
  Filled 2017-12-09: qty 1
  Filled 2017-12-09: qty 2
  Filled 2017-12-09 (×2): qty 1
  Filled 2017-12-09 (×4): qty 2
  Filled 2017-12-09: qty 1
  Filled 2017-12-09: qty 2

## 2017-12-09 MED ORDER — DIPHENHYDRAMINE HCL 12.5 MG/5ML PO ELIX: 12.5000 mg | ORAL_SOLUTION | ORAL | Status: DC | PRN

## 2017-12-09 MED ORDER — BUPIVACAINE HCL (PF) 0.25 % IJ SOLN
INTRAMUSCULAR | Status: AC
  Filled 2017-12-09: qty 30

## 2017-12-09 MED ORDER — DOCUSATE SODIUM 100 MG PO CAPS: 100.0000 mg | ORAL_CAPSULE | Freq: Two times a day (BID) | ORAL | 0 refills | Status: DC

## 2017-12-09 MED ORDER — DEXTROSE 5 % IV SOLN
INTRAVENOUS | Status: DC | PRN
  Administered 2017-12-09: 25 ug/min via INTRAVENOUS

## 2017-12-09 MED ORDER — SODIUM CHLORIDE 0.9 % IJ SOLN
INTRAMUSCULAR | Status: AC
  Filled 2017-12-09: qty 50

## 2017-12-09 MED ORDER — ATORVASTATIN CALCIUM 40 MG PO TABS
40.0000 mg | ORAL_TABLET | Freq: Every day | ORAL | Status: DC
  Administered 2017-12-09 – 2017-12-11 (×3): 40 mg via ORAL
  Filled 2017-12-09 (×3): qty 1

## 2017-12-09 MED ORDER — BUPIVACAINE IN DEXTROSE 0.75-8.25 % IT SOLN
INTRATHECAL | Status: DC | PRN
  Administered 2017-12-09: 15 mg via INTRATHECAL

## 2017-12-09 MED ORDER — DOCUSATE SODIUM 100 MG PO CAPS
100.0000 mg | ORAL_CAPSULE | Freq: Two times a day (BID) | ORAL | Status: DC
  Administered 2017-12-09 – 2017-12-12 (×6): 100 mg via ORAL
  Filled 2017-12-09 (×6): qty 1

## 2017-12-09 MED ORDER — ENOXAPARIN SODIUM 80 MG/0.8ML ~~LOC~~ SOLN
80.0000 mg | Freq: Two times a day (BID) | SUBCUTANEOUS | Status: DC
  Administered 2017-12-10 – 2017-12-12 (×5): 80 mg via SUBCUTANEOUS
  Filled 2017-12-09 (×5): qty 0.8

## 2017-12-09 MED ORDER — CARVEDILOL 3.125 MG PO TABS
3.1250 mg | ORAL_TABLET | Freq: Two times a day (BID) | ORAL | Status: DC
  Administered 2017-12-09 – 2017-12-12 (×6): 3.125 mg via ORAL
  Filled 2017-12-09 (×6): qty 1

## 2017-12-09 MED ORDER — ONDANSETRON HCL 4 MG/2ML IJ SOLN: 4.0000 mg | Freq: Four times a day (QID) | INTRAMUSCULAR | Status: DC | PRN

## 2017-12-09 MED ORDER — SODIUM CHLORIDE 0.9 % IV SOLN
INTRAVENOUS | Status: DC
  Administered 2017-12-09: 14:00:00 via INTRAVENOUS

## 2017-12-09 MED ORDER — SODIUM CHLORIDE 0.9 % IJ SOLN
INTRAMUSCULAR | Status: DC | PRN
  Administered 2017-12-09: 30 mL

## 2017-12-09 MED ORDER — CEFAZOLIN SODIUM-DEXTROSE 2-4 GM/100ML-% IV SOLN
2.0000 g | Freq: Four times a day (QID) | INTRAVENOUS | Status: AC
  Administered 2017-12-09 (×2): 2 g via INTRAVENOUS
  Filled 2017-12-09 (×2): qty 100

## 2017-12-09 MED ORDER — HYDROMORPHONE HCL 1 MG/ML IJ SOLN: 0.5000 mg | INTRAMUSCULAR | Status: DC | PRN

## 2017-12-09 MED ORDER — BUPIVACAINE-EPINEPHRINE 0.5% -1:200000 IJ SOLN
INTRAMUSCULAR | Status: DC | PRN
  Administered 2017-12-09: 30 mL

## 2017-12-09 MED ORDER — MIDAZOLAM HCL 5 MG/5ML IJ SOLN
INTRAMUSCULAR | Status: DC | PRN
  Administered 2017-12-09: 1 mg via INTRAVENOUS

## 2017-12-09 MED ORDER — TRANEXAMIC ACID 1000 MG/10ML IV SOLN
1000.0000 mg | INTRAVENOUS | Status: DC
  Filled 2017-12-09 (×2): qty 10

## 2017-12-09 MED ORDER — ALUM & MAG HYDROXIDE-SIMETH 200-200-20 MG/5ML PO SUSP: 30.0000 mL | ORAL | Status: DC | PRN

## 2017-12-09 MED ORDER — METHOCARBAMOL 500 MG PO TABS
500.0000 mg | ORAL_TABLET | Freq: Four times a day (QID) | ORAL | Status: DC | PRN
  Administered 2017-12-10 – 2017-12-12 (×6): 500 mg via ORAL
  Filled 2017-12-09 (×7): qty 1

## 2017-12-09 MED ORDER — PROMETHAZINE HCL 25 MG/ML IJ SOLN: 6.2500 mg | INTRAMUSCULAR | Status: DC | PRN

## 2017-12-09 MED ORDER — MIDAZOLAM HCL 2 MG/2ML IJ SOLN
2.0000 mg | Freq: Once | INTRAMUSCULAR | Status: AC
  Administered 2017-12-09: 2 mg via INTRAVENOUS
  Filled 2017-12-09: qty 2

## 2017-12-09 MED ORDER — BISACODYL 5 MG PO TBEC
5.0000 mg | DELAYED_RELEASE_TABLET | Freq: Every day | ORAL | Status: DC | PRN
  Administered 2017-12-11: 5 mg via ORAL
  Filled 2017-12-09: qty 1

## 2017-12-09 MED ORDER — INSULIN ASPART 100 UNIT/ML ~~LOC~~ SOLN
0.0000 [IU] | Freq: Three times a day (TID) | SUBCUTANEOUS | Status: DC
  Administered 2017-12-09: 18:00:00 8 [IU] via SUBCUTANEOUS
  Administered 2017-12-10 – 2017-12-11 (×4): 3 [IU] via SUBCUTANEOUS
  Administered 2017-12-11 (×2): 2 [IU] via SUBCUTANEOUS
  Administered 2017-12-12: 08:00:00 3 [IU] via SUBCUTANEOUS

## 2017-12-09 MED ORDER — FENTANYL CITRATE (PF) 100 MCG/2ML IJ SOLN
100.0000 ug | Freq: Once | INTRAMUSCULAR | Status: AC
  Administered 2017-12-09: 100 ug via INTRAVENOUS
  Filled 2017-12-09: qty 2

## 2017-12-09 MED ORDER — CANAGLIFLOZIN 100 MG PO TABS
100.0000 mg | ORAL_TABLET | Freq: Every day | ORAL | Status: DC
  Administered 2017-12-10 – 2017-12-12 (×3): 100 mg via ORAL
  Filled 2017-12-09 (×3): qty 1

## 2017-12-09 MED ORDER — HYDROMORPHONE HCL 1 MG/ML IJ SOLN
0.2500 mg | INTRAMUSCULAR | Status: DC | PRN
Start: 2017-12-09 — End: 2017-12-09

## 2017-12-09 MED ORDER — WARFARIN - PHARMACIST DOSING INPATIENT
Freq: Every day | Status: DC
  Administered 2017-12-10 – 2017-12-11 (×2)

## 2017-12-09 SURGICAL SUPPLY — 54 items
BAG ZIPLOCK 12X15 (MISCELLANEOUS) ×2 IMPLANT
BANDAGE ACE 6X5 VEL STRL LF (GAUZE/BANDAGES/DRESSINGS) ×2 IMPLANT
BENZOIN TINCTURE PRP APPL 2/3 (GAUZE/BANDAGES/DRESSINGS) IMPLANT
BLADE SAG 18X100X1.27 (BLADE) ×2 IMPLANT
BLADE SAW SGTL 13.0X1.19X90.0M (BLADE) ×2 IMPLANT
BOOTIES KNEE HIGH SLOAN (MISCELLANEOUS) ×2 IMPLANT
BOWL SMART MIX CTS (DISPOSABLE) ×2 IMPLANT
CAPT KNEE TOTAL 3 ATTUNE ×2 IMPLANT
CEMENT HV SMART SET (Cement) ×4 IMPLANT
CUFF TOURN SGL QUICK 34 (TOURNIQUET CUFF) ×1
CUFF TRNQT CYL 34X4X40X1 (TOURNIQUET CUFF) ×1 IMPLANT
DRAPE TOP 10253 STERILE (DRAPES) IMPLANT
DRAPE U-SHAPE 47X51 STRL (DRAPES) ×2 IMPLANT
DRSG ADAPTIC 3X8 NADH LF (GAUZE/BANDAGES/DRESSINGS) IMPLANT
DRSG AQUACEL AG ADV 3.5X10 (GAUZE/BANDAGES/DRESSINGS) ×2 IMPLANT
DRSG PAD ABDOMINAL 8X10 ST (GAUZE/BANDAGES/DRESSINGS) ×2 IMPLANT
DURAPREP 26ML APPLICATOR (WOUND CARE) ×2 IMPLANT
ELECT REM PT RETURN 15FT ADLT (MISCELLANEOUS) ×2 IMPLANT
GAUZE SPONGE 4X4 12PLY STRL (GAUZE/BANDAGES/DRESSINGS) IMPLANT
GLOVE BIOGEL PI IND STRL 7.0 (GLOVE) ×4 IMPLANT
GLOVE BIOGEL PI IND STRL 8 (GLOVE) ×2 IMPLANT
GLOVE BIOGEL PI INDICATOR 7.0 (GLOVE) ×4
GLOVE BIOGEL PI INDICATOR 8 (GLOVE) ×2
GLOVE ECLIPSE 7.5 STRL STRAW (GLOVE) ×4 IMPLANT
GOWN STRL REUS W/ TWL LRG LVL4 (GOWN DISPOSABLE) ×1 IMPLANT
GOWN STRL REUS W/TWL LRG LVL4 (GOWN DISPOSABLE) ×1
GOWN STRL REUS W/TWL XL LVL3 (GOWN DISPOSABLE) ×4 IMPLANT
HANDPIECE INTERPULSE COAX TIP (DISPOSABLE) ×1
HOOD PEEL AWAY FLYTE STAYCOOL (MISCELLANEOUS) ×6 IMPLANT
IMMOBILIZER KNEE 20 (SOFTGOODS) ×2
IMMOBILIZER KNEE 20 THIGH 36 (SOFTGOODS) ×1 IMPLANT
MANIFOLD NEPTUNE II (INSTRUMENTS) ×2 IMPLANT
NEEDLE HYPO 22GX1.5 SAFETY (NEEDLE) ×2 IMPLANT
NS IRRIG 1000ML POUR BTL (IV SOLUTION) ×2 IMPLANT
PACK ICE MAXI GEL EZY WRAP (MISCELLANEOUS) ×2 IMPLANT
PACK TOTAL KNEE CUSTOM (KITS) ×2 IMPLANT
PAD ABD 8X10 STRL (GAUZE/BANDAGES/DRESSINGS) ×2 IMPLANT
PADDING CAST COTTON 6X4 STRL (CAST SUPPLIES) ×2 IMPLANT
POSITIONER SURGICAL ARM (MISCELLANEOUS) ×2 IMPLANT
SET HNDPC FAN SPRY TIP SCT (DISPOSABLE) ×1 IMPLANT
STAPLER VISISTAT 35W (STAPLE) ×2 IMPLANT
STRIP CLOSURE SKIN 1/2X4 (GAUZE/BANDAGES/DRESSINGS) IMPLANT
SUT MNCRL AB 3-0 PS2 18 (SUTURE) IMPLANT
SUT VIC AB 0 CT1 36 (SUTURE) ×2 IMPLANT
SUT VIC AB 1 CT1 36 (SUTURE) ×4 IMPLANT
SUT VIC AB 2-0 CT1 27 (SUTURE) ×2
SUT VIC AB 2-0 CT1 TAPERPNT 27 (SUTURE) ×2 IMPLANT
SWABSTK COMLB BENZOIN TINCTURE (MISCELLANEOUS) ×2 IMPLANT
SYR CONTROL 10ML LL (SYRINGE) ×4 IMPLANT
TOWEL OR NON WOVEN STRL DISP B (DISPOSABLE) ×2 IMPLANT
TRAY FOLEY W/METER SILVER 16FR (SET/KITS/TRAYS/PACK) ×2 IMPLANT
WATER STERILE IRR 1000ML POUR (IV SOLUTION) ×4 IMPLANT
WRAP KNEE MAXI GEL POST OP (GAUZE/BANDAGES/DRESSINGS) ×2 IMPLANT
YANKAUER SUCT BULB TIP 10FT TU (MISCELLANEOUS) ×2 IMPLANT

## 2017-12-09 NOTE — Anesthesia Procedure Notes (Signed)
Spinal  Patient location during procedure: OR End time: 12/09/2017 9:28 AM Staffing Anesthesiologist: Jairo BenJackson, Shariya Gaster, MD Performed: anesthesiologist  Preanesthetic Checklist Completed: patient identified, site marked, surgical consent, pre-op evaluation, timeout performed, IV checked, risks and benefits discussed and monitors and equipment checked Spinal Block Patient position: sitting Prep: site prepped and draped and DuraPrep Patient monitoring: blood pressure, continuous pulse ox, cardiac monitor and heart rate Approach: midline Location: L3-4 Injection technique: single-shot Needle Needle type: Pencan  Needle gauge: 24 G Needle length: 9 cm Additional Notes Pt identified in Operating room.  Monitors applied. Working IV access confirmed. Sterile prep, drape lumbar spine.  1% lido local L 3,4 by CRNA, who was unable to enter CSF.  Repeat local L 3,4 and #24ga Pencan into clear CSF L 3,4.  15mg  0.75% Bupivacaine with dextrose injected with asp CSF beginning and end of injection.  Patient asymptomatic, VSS, no heme aspirated, tolerated well.  Dennis Craze Deniya Craigo, MD

## 2017-12-09 NOTE — Progress Notes (Signed)
Dr. Jean RosenthalJackson notified that pt took lovenox yesterday at 0800, coumadin last Thursday .  Also notified that pt did not take his coreg 3.125mg  that he takes twice a day.  Verbal order given to administer to pt.  PT/INR performed two days ago and no need for new blood draw today per Dr. Jean RosenthalJackson.

## 2017-12-09 NOTE — Progress Notes (Signed)
PT Cancellation Note  Patient Details Name: Dennis Bates MRN: [id number] DOB: [birth year]   Cancelled Treatment:    Reason Eval/Treat Not Completed: Medical issues which prohibited therapy, legs remain numb.   Rada HayHill, Lanaysia Fritchman Elizabeth 12/09/2017, 5:23 PM Blanchard KelchKaren Vella Colquitt PT [phone]

## 2017-12-09 NOTE — Transfer of Care (Signed)
Immediate Anesthesia Transfer of Care Note  Patient: Dennis Bates  Procedure(s) Performed: RIGHT TOTAL KNEE ARTHROPLASTY REMOVAL OF HARDWARE RIGHT KNEE (Right Knee)  Patient Location: PACU  Anesthesia Type:Spinal  Level of Consciousness: awake, alert , oriented and patient cooperative  Airway & Oxygen Therapy: Patient Spontanous Breathing and Patient connected to face mask oxygen  Post-op Assessment: Report given to RN and Post -op Vital signs reviewed and stable  Post vital signs: stable  Last Vitals:  Vitals Value Taken Time  BP 111/71 12/09/2017 11:45 AM  Temp 36.4 C 12/09/2017 11:41 AM  Pulse 66 12/09/2017 11:45 AM  Resp 16 12/09/2017 11:45 AM  SpO2 100 % 12/09/2017 11:45 AM  Vitals shown include unvalidated device data.  Last Pain:  Vitals:   12/09/17 1141  TempSrc:   PainSc: (P) 0-No pain      Patients Stated Pain Goal: 4 (12/09/17 16100826)  Complications: No apparent anesthesia complications

## 2017-12-09 NOTE — Progress Notes (Signed)
ANTICOAGULATION CONSULT NOTE - Initial Consult  Pharmacy Consult for Lovenox/Coumadin Indication: mechanical heart valve  No Known Allergies  Patient Measurements: Height: 5' 7.5" (171.5 cm) Weight: 191 lb (86.6 kg) IBW/kg (Calculated) : 67.25  Vital Signs: Temp: 97.4 F (36.3 C) (03/29 1309) Temp Source: Oral (03/29 0753) BP: 113/69 (03/29 1309) Pulse Rate: 64 (03/29 1309)  Labs: Recent Labs    12/07/17 0937  HGB 15.0  HCT 44.4  PLT 313  APTT 31  LABPROT 12.2  INR 0.91  CREATININE 1.22    Estimated Creatinine Clearance: 71.7 mL/min (by C-G formula based on SCr of 1.22 mg/dL).   Medical History: Past Medical History:  Diagnosis Date  . Aortic stenosis due to bicuspid aortic valve    Severe aortic stenosis of bicuspid valve - s/pAVR with Bentall ( 09/2015)  . Bell's palsy    LAST EPISODE 09-2015  . CAD S/P percutaneous coronary angioplasty 09/20/2014   a. inferolat STEMI ->> LHC-Angio: Proximal/ostial LAD 20%, OM2 99% -->> PCI: 3mm x 16 mm Promus Premier DES to the OM2.(~3.5 mm)  . Diabetes mellitus type 2, controlled, with complications (HCC)   . Hyperlipidemia   . Hypertension   . Hypertriglyceridemia   . ST elevation myocardial infarction (STEMI) of inferior wall (HCC) 09/20/2014   99 % occluded Cx-OM2 Promus DES 3.0 mm x 16 mm (3.5 mm)    Medications:  PTA Coumadin: 7.5mg  daily except 10mg  on MWF  Assessment: 57 yo M on chronic Coumadin for mechanical heart valve ([facility]) who is s/p right TKA today.  Coumadin was stopped on 3/21 for procedure & bridged with Lovenox 80mg .  CBC reviewed- WNL.  No active bleeding noted.  Anticipate post-op acute blood loss anemia.   Goal of Therapy:  INR 2-3 Anti-Xa level 0.6-1 units/ml 4hrs after LMWH dose given Monitor platelets by anticoagulation protocol: Yes   Plan:  ([facility] Lovenox 80mg  sq q12h Coumadin 10mg  po daily Daily INR CBC q3days while on  Lovenox  Khiya Friese, Mercy RidingAndrea Michelle 12/09/2017,1:40 PM

## 2017-12-09 NOTE — Brief Op Note (Signed)
12/09/2017  11:30 AM  PATIENT:  Dennis Bates  57 y.o. male  PRE-OPERATIVE DIAGNOSIS:  OSTEOARTHRITIS RIGHT KNEE  POST-OPERATIVE DIAGNOSIS:  OSTEOARTHRITIS RIGHT KNEE  PROCEDURE:  Procedure(s): RIGHT TOTAL KNEE ARTHROPLASTY REMOVAL OF HARDWARE RIGHT KNEE (Right)  SURGEON:  Surgeon(s) and Role:    Jodi Geralds* Janeece Blok, MD - Primary  PHYSICIAN ASSISTANT:   ASSISTANTS: bethune   ANESTHESIA:   spinal  EBL:  50 mL   BLOOD ADMINISTERED:none  DRAINS: none   LOCAL MEDICATIONS USED:  MARCAINE    and OTHER experel  SPECIMEN:  No Specimen  DISPOSITION OF SPECIMEN:  N/A  COUNTS:  YES  TOURNIQUET:   Total Tourniquet Time Documented: Thigh (Right) - 75 minutes Total: Thigh (Right) - 75 minutes   DICTATION: .Other Dictation: Dictation Number [id number]  PLAN OF CARE: Admit to inpatient   PATIENT DISPOSITION:  PACU - hemodynamically stable.   Delay start of Pharmacological VTE agent (>24hrs) due to surgical blood loss or risk of bleeding: no

## 2017-12-09 NOTE — Anesthesia Postprocedure Evaluation (Signed)
Anesthesia Post Note  Patient: Dennis Bates  Procedure(s) Performed: RIGHT TOTAL KNEE ARTHROPLASTY REMOVAL OF HARDWARE RIGHT KNEE (Right Knee)     Patient location during evaluation: PACU Anesthesia Type: Spinal and Regional Level of consciousness: awake and alert, oriented and patient cooperative Pain management: pain level controlled Vital Signs Assessment: post-procedure vital signs reviewed and stable Respiratory status: spontaneous breathing, nonlabored ventilation and respiratory function stable Cardiovascular status: blood pressure returned to baseline and stable Postop Assessment: spinal receding, patient able to bend at knees and no apparent nausea or vomiting Anesthetic complications: no    Last Vitals:  Vitals:   12/09/17 1309 12/09/17 1413  BP: 113/69 115/81  Pulse: 64 66  Resp: 19 16  Temp: (!) 36.3 C (!) 36.4 C  SpO2: 100% 100%    Last Pain:  Vitals:   12/09/17 1413  TempSrc: Oral  PainSc:                  Namari Breton,E. Jariyah Hackley

## 2017-12-09 NOTE — Anesthesia Preprocedure Evaluation (Addendum)
Anesthesia Evaluation  Patient identified by MRN, date of birth, ID band Patient awake    Reviewed: Allergy & Precautions, NPO status , Patient's Chart, lab work & pertinent test results  History of Anesthesia Complications Negative for: history of anesthetic complications  Airway Mallampati: II  TM Distance: >3 FB Neck ROM: Full    Dental  (+) Dental Advisory Given   Pulmonary neg pulmonary ROS,    breath sounds clear to auscultation       Cardiovascular hypertension, Pt. on medications and Pt. on home beta blockers (-) angina+ CAD, + Past MI, + Cardiac Stents and + CABG  + Valvular Problems/Murmurs (s/p AVR/Bentall for bicuspid aortic valve)  Rhythm:Regular Rate:Normal  '17 ECHO:  normal pump function and well-seated, normally functioning mechanical aortic valve. No regional wall motion abnormalities    Neuro/Psych negative neurological ROS     GI/Hepatic negative GI ROS, Neg liver ROS,   Endo/Other  diabetes, Oral Hypoglycemic Agents  Renal/GU negative Renal ROS     Musculoskeletal  (+) Arthritis , Osteoarthritis,    Abdominal   Peds  Hematology Coumadin: INR 0.91 12/07/17 lovenox bridge, last dose 8am 12/08/17   Anesthesia Other Findings   Reproductive/Obstetrics                            Anesthesia Physical Anesthesia Plan  ASA: III  Anesthesia Plan: Spinal   Post-op Pain Management:  Regional for Post-op pain   Induction:   PONV Risk Score and Plan: 1 and Ondansetron and Treatment may vary due to age or medical condition  Airway Management Planned: Natural Airway and Simple Face Mask  Additional Equipment:   Intra-op Plan:   Post-operative Plan:   Informed Consent: I have reviewed the patients History and Physical, chart, labs and discussed the procedure including the risks, benefits and alternatives for the proposed anesthesia with the patient or authorized  representative who has indicated his/her understanding and acceptance.   Dental advisory given  Plan Discussed with: CRNA and Surgeon  Anesthesia Plan Comments: (Plan routine monitors, SAB with adductor canal block for post op analgesia)        Anesthesia Quick Evaluation

## 2017-12-09 NOTE — Anesthesia Procedure Notes (Signed)
Anesthesia Regional Block: Adductor canal block   Pre-Anesthetic Checklist: ,, timeout performed, Correct Patient, Correct Site, Correct Laterality, Correct Procedure, Correct Position, site marked, Risks and benefits discussed,  Surgical consent,  Pre-op evaluation,  At surgeon's request and post-op pain management  Laterality: Right and Lower  Prep: chloraprep       Needles:  Injection technique: Single-shot  Needle Type: Echogenic Needle     Needle Length: 9cm  Needle Gauge: 21     Additional Needles:   Procedures:,,,, ultrasound used (permanent image in chart),,,,  Narrative:  Start time: 12/09/2017 9:04 AM End time: 12/09/2017 9:11 AM Injection made incrementally with aspirations every 5 mL.  Performed by: Personally  Anesthesiologist: Jairo BenJackson, Tamarah Bhullar, MD  Additional Notes: Pt identified in Holding room.  Monitors applied. Working IV access confirmed. Sterile prep, drape R thigh.  #21ga ECHOgenic needle into adductor canal with US guidance.  20cc 0.75% Ropivacaine injected incrementally after negative test dose.  Patient asymptomatic, VSS, no heme aspirated, tolerated well.  Sandford Craze Madaleine Simmon, MD

## 2017-12-09 NOTE — Progress Notes (Signed)
AssistedDr. Carswell Jackson with right, ultrasound guided, adductor canal block. Side rails up, monitors on throughout procedure. See vital signs in flow sheet. Tolerated Procedure well.  

## 2017-12-09 NOTE — Anesthesia Procedure Notes (Addendum)
Procedure Name: MAC Date/Time: 12/09/2017 9:16 AM Performed by: Lissa Morales, CRNA Pre-anesthesia Checklist: Patient identified, Emergency Drugs available, Suction available, Patient being monitored and Timeout performed Patient Re-evaluated:Patient Re-evaluated prior to induction Oxygen Delivery Method: Simple face mask Placement Confirmation: positive ETCO2

## 2017-12-09 NOTE — H&P (Addendum)
TOTAL KNEE ADMISSION H&P  Patient is being admitted for right total knee arthroplasty.  Subjective:  Chief Complaint:right knee pain.  HPI: Dennis Bates, 57 y.o. male, has a history of pain and functional disability in the right knee due to trauma and arthritis and has failed non-surgical conservative treatments for greater than 12 weeks to includeNSAID's and/or analgesics, corticosteriod injections, viscosupplementation injections, use of assistive devices, weight reduction as appropriate and activity modification.  Onset of symptoms was gradual, starting 4 years ago with gradually worsening course since that time. The patient noted prior procedures on the knee to include  arthroscopy, menisectomy and ACL reconstruction on the right knee(s).  Patient currently rates pain in the right knee(s) at 8 out of 10 with activity. Patient has night pain, worsening of pain with activity and weight bearing, pain that interferes with activities of daily living, pain with passive range of motion and joint swelling.  Patient has evidence of subchondral sclerosis, periarticular osteophytes, joint subluxation and joint space narrowing by imaging studies. This patient has had Failure of all reasonable conservative care. There is no active infection.  Patient Active Problem List   Diagnosis Date Noted  . Preoperative clearance 09/02/2017  . Encounter for medication adjustment 07/14/2016  . Long term (current) use of anticoagulants [Z79.01] 10/27/2015  . Hx of prosthetic aortic valve replacement -mechanical; along with aortic root ([place]) 10/27/2015  . Type 2 diabetes mellitus with hyperglycemia ([place]) 10/21/2015  . Aortic root dilatation ([place])   . Hypertriglyceridemia   . CAD S/P p DES PCI to the Circumflex-OM 2   . Diabetes mellitus type II, non insulin dependent ([place]) 09/21/2014  . Aortic stenosis due to bicuspid aortic valve 09/21/2014  . Dyslipidemia, goal LDL below 70 09/21/2014  . ST elevation  myocardial infarction (STEMI) of inferolateral wall ([place]) 09/20/2014   Past Medical History:  Diagnosis Date  . Aortic stenosis due to bicuspid aortic valve    Severe aortic stenosis of bicuspid valve - s/pAVR with [place] ( 09/2015)  . Bell's palsy    LAST EPISODE 09-2015  . CAD S/P percutaneous coronary angioplasty 09/20/2014   a. inferolat STEMI ->> LHC-Angio: Proximal/ostial LAD 20%, OM2 99% -->> PCI: 63m x 16 mm Promus Premier DES to the OM2.(~3.5 mm)  . Diabetes mellitus type 2, controlled, with complications ([place]   . Hyperlipidemia   . Hypertension   . Hypertriglyceridemia   . ST elevation myocardial infarction (STEMI) of inferior wall ([facility]) 09/20/2014   99 % occluded Cx-OM2 Promus DES 3.0 mm x 16 mm (3.5 mm)    Past Surgical History:  Procedure Laterality Date  . B[place]ALL PROCEDURE N/A 10/09/2015   Procedure: B[place] PROCEDURE (using a St Jude mechanical valve, size 23);  Surgeon: BGaye Pollack MD;  [facility]  Service: Open Heart Surgery;  Laterality: N/A;  CIRC ARRESTNEEDS RIGHT RADIAL A-LINE  . KNEE ARTHROSCOPY W/ ACL RECONSTRUCTION Right    90's; "2 ATHROSCOPIC , 2 REBUILDS"  . LEFT HEART CATHETERIZATION WITH CORONARY ANGIOGRAM N/A 09/20/2014   Procedure: LEFT HEART CATHETERIZATION WITH CORONARY ANGIOGRAM;  Surgeon: DLeonie Man MD;  [facility];  Service: Cardiovascular;  Laterality: N/A;  . PERCUTANEOUS CORONARY STENT INTERVENTION (PCI-S)  09/20/2014   PTCI to OM 2 with Promus Premier DES 3.0 mm x 16 mm (3.5 mm)  . RIGHT/LEFT HEART CATH AND CORONARY ANGIOGRAPHY  09/2015   Dr. TShelva Majestic Widely patent OM 2 stent.  Mild disease in RCA and LAD.Relatively normal right heart cath  pressures.  Normal cardiac output.  . TEE WITHOUT CARDIOVERSION N/A 10/09/2015   Procedure: TRANSESOPHAGEAL ECHOCARDIOGRAM (TEE);  Surgeon: Gaye Pollack, MD;  Location: [facility];  Service: Open Heart Surgery;  Laterality: N/A;  . TRANSTHORACIC ECHOCARDIOGRAM  02/2016   Post AVR:  mechanical: AVR without obstuction. LVEF improved to 65-70%.  Domingo Dimes ECHOCARDIOGRAM  January 2016, February2016   Probable bicuspid aortic valve: a. mod-sev by 2D ECHO 09/2014. AVA 0.9cm^2; b) 10/2014 Echo:. Severe AS Mn-Pk Gradient 41-71 mmHg, AVA ~0.79 cm2, EF 60-65%    No current facility-administered medications for this encounter.    Current Outpatient Medications  Medication Sig Dispense Refill Last Dose  . acetaminophen (TYLENOL) 325 MG tablet Take 2 tablets (650 mg total) by mouth every 6 (six) hours as needed for mild pain.   Taking  . atorvastatin (LIPITOR) 20 MG tablet Take 1 tablet (20 mg total) by mouth daily. (Patient taking differently: Take 40 mg by mouth daily. ) 30 tablet 11 Taking  . carvedilol (COREG) 3.125 MG tablet Take 1 tablet (3.125 mg total) by mouth 2 (two) times daily. 60 tablet 3   . dapagliflozin propanediol (FARXIGA) 10 MG TABS tablet Take 10 mg by mouth daily.     . fenofibrate 160 MG tablet Take 1 tablet (160 mg total) by mouth daily. 30 tablet 9 Taking  . glipiZIDE (GLUCOTROL XL) 5 MG 24 hr tablet Take 1 tablet (5 mg total) by mouth daily with breakfast. 30 tablet 3 Taking  . metFORMIN (GLUCOPHAGE) 1000 MG tablet Take 1,000 mg by mouth 2 (two) times daily with a meal.    Taking  . Multiple Vitamins-Minerals (MULTIVITAMIN GUMMIES ADULT PO) Take 2 each by mouth daily.     Marland Kitchen aspirin EC 81 MG EC tablet Take 1 tablet (81 mg total) by mouth daily. (Patient taking differently: Take 81 mg by mouth every morning. )   Taking  . enoxaparin (LOVENOX) 80 MG/0.8ML injection Inject 0.8 mLs (80 mg total) into the skin every 12 (twelve) hours. As directed by coumadin clinic 20 Syringe 1   . warfarin (COUMADIN) 5 MG tablet Take 1.5 to 2 tablets by mouth daily or as directed by coumadin clinic (Patient taking differently: Take 7.5-10 mg by mouth See admin instructions. Take 10 mg by mouth daily on Monday, Wednesday and Friday. Take 7.5 mg by mouth daily on all other days.) 30  tablet 0 Taking   No Known Allergies  Social History   Tobacco Use  . Smoking status: Never Smoker  . Smokeless tobacco: Former Systems developer    Types: Chew  Substance Use Topics  . Alcohol use: Yes    Alcohol/week: 0.0 oz    Comment: socially    Family History  Problem Relation Age of Onset  . Heart disease Mother   . CAD Neg Hx        per wife     ROS ROS: I have reviewed the patient's review of systems thoroughly and there are no positive responses as relates to the HPI. Objective:  Physical Exam  Vital signs in last 24 hours:   Well-developed well-nourished patient in no acute distress. Alert and oriented x3 HEENT:within normal limits Cardiac: Regular rate and rhythm Pulmonary: Lungs clear to auscultation Abdomen: Soft and nontender.  Normal active bowel sounds  Musculoskeletal: (Right knee: Painful range of motion.  Trace effusion.  No instability.  Pain and grinding through range of motion. Labs: Recent Results (from the past 2160 hour(s))  Protime-INR  Status: Abnormal   Collection Time: 12/02/17 12:00 AM  Result Value Ref Range   INR 1.6 (A) 0.9 - 1.1  Glucose, capillary     Status: Abnormal   Collection Time: 12/07/17  8:51 AM  Result Value Ref Range   Glucose-Capillary 129 (H) 65 - 99 mg/dL  Surgical pcr screen     Status: Abnormal   Collection Time: 12/07/17  9:16 AM  Result Value Ref Range   MRSA, PCR NEGATIVE NEGATIVE   Staphylococcus aureus POSITIVE (A) NEGATIVE    Comment: (NOTE) The Xpert SA Assay (FDA approved for NASAL specimens in patients 31 years of age and older), is one component of a comprehensive surveillance program. It is not intended to diagnose infection nor to guide or monitor treatment. Performed at [facility], [place] [address] [place], [place] [address]   ABO/Rh     Status: None   Collection Time: 12/07/17  9:36 AM  Result Value Ref Range   ABO/RH(D)      AB POS Performed at [facility],  [place] [address], [place], [place] [address]   Hemoglobin A1c     Status: Abnormal   Collection Time: 12/07/17  9:37 AM  Result Value Ref Range   Hgb A1c MFr Bld 7.6 (H) 4.8 - 5.6 %    Comment: (NOTE) Pre diabetes:          5.7%-6.4% Diabetes:              >6.4% Glycemic control for   <7.0% adults with diabetes    Mean Plasma Glucose 171.42 mg/dL    Comment: Performed at [place] [address] [place], [place] [address]  APTT     Status: None   Collection Time: 12/07/17  9:37 AM  Result Value Ref Range   aPTT 31 24 - 36 seconds    Comment: Performed at [facility], [place] [address] [place], [place] [address]  CBC WITH DIFFERENTIAL     Status: None   Collection Time: 12/07/17  9:37 AM  Result Value Ref Range   WBC 5.2 4.0 - 10.5 K/uL   RBC 5.17 4.22 - 5.81 MIL/uL   Hemoglobin 15.0 13.0 - 17.0 g/dL   HCT 44.4 39.0 - 52.0 %   MCV 85.9 78.0 - 100.0 fL   MCH 29.0 26.0 - 34.0 pg   MCHC 33.8 30.0 - 36.0 g/dL   RDW 13.5 11.5 - 15.5 %   Platelets 313 150 - 400 K/uL   Neutrophils Relative % 65 %   Neutro Abs 3.4 1.7 - 7.7 K/uL   Lymphocytes Relative 25 %   Lymphs Abs 1.3 0.7 - 4.0 K/uL   Monocytes Relative 8 %   Monocytes Absolute 0.4 0.1 - 1.0 K/uL   Eosinophils Relative 2 %   Eosinophils Absolute 0.1 0.0 - 0.7 K/uL   Basophils Relative 0 %   Basophils Absolute 0.0 0.0 - 0.1 K/uL    Comment: Performed at [facility], [place] [address], [place], [place] [address]  Comprehensive metabolic panel     Status: Abnormal   Collection Time: 12/07/17  9:37 AM  Result Value Ref Range   Sodium 137 135 - 145 mmol/L   Potassium 5.5 (H) 3.5 - 5.1 mmol/L   Chloride 102 101 - 111 mmol/L   CO2 25 22 - 32 mmol/L   Glucose, Bld 135 (H) 65 - 99 mg/dL   BUN 14 6 - 20 mg/dL  Creatinine, Ser 1.22 0.61 - 1.24 mg/dL   Calcium 9.9 8.9 - 10.3 mg/dL   Total Protein 7.2 6.5 - 8.1 g/dL   Albumin 4.9 3.5 - 5.0 g/dL   AST 77 (H) 15 - 41 U/L   ALT 60 17 -  63 U/L   Alkaline Phosphatase 32 (L) 38 - 126 U/L   Total Bilirubin 1.2 0.3 - 1.2 mg/dL   GFR calc non Af Amer >60 >60 mL/min   GFR calc Af Amer >60 >60 mL/min    Comment: (NOTE) The eGFR has been calculated using the CKD EPI equation. This calculation has not been validated in all clinical situations. eGFR's persistently <60 mL/min signify possible Chronic Kidney Disease.    Anion gap 10 5 - 15    Comment: Performed at [facility], [place] [address], [place], [place] [address]  Protime-INR     Status: None   Collection Time: 12/07/17  9:37 AM  Result Value Ref Range   Prothrombin Time 12.2 11.4 - 15.2 seconds   INR 0.91     Comment: Performed at [facility], [place] [address], [place], [place] [address]  Type and screen Order type and screen if day of surgery is less than 15 days from draw of preadmission visit or order morning of surgery if day of surgery is greater than 6 days from preadmission visit.     Status: None   Collection Time: 12/07/17  9:37 AM  Result Value Ref Range   ABO/RH(D) AB POS    Antibody Screen NEG    Sample Expiration 12/12/2017    Extend sample reason      NO TRANSFUSIONS OR PREGNANCY IN THE PAST 3 MONTHS Performed at [facility], [place] [address] [place], [place] [address]   Urinalysis, Routine w reflex microscopic     Status: Abnormal   Collection Time: 12/07/17  9:37 AM  Result Value Ref Range   Color, Urine YELLOW YELLOW   APPearance CLEAR CLEAR   Specific Gravity, Urine 1.026 1.005 - 1.030   pH 5.0 5.0 - 8.0   Glucose, UA >=500 (A) NEGATIVE mg/dL   Hgb urine dipstick NEGATIVE NEGATIVE   Bilirubin Urine NEGATIVE NEGATIVE   Ketones, ur NEGATIVE NEGATIVE mg/dL   Protein, ur NEGATIVE NEGATIVE mg/dL   Nitrite NEGATIVE NEGATIVE   Leukocytes, UA NEGATIVE NEGATIVE   RBC / HPF 0-5 0 - 5 RBC/hpf   WBC, UA 0-5 0 - 5 WBC/hpf   Bacteria, UA RARE (A) NONE SEEN   Squamous Epithelial / LPF NONE SEEN NONE  SEEN   Mucus PRESENT     Comment: Performed at [facility], [place] [address], [place], [place] [address]    Estimated body mass index is 29.47 kg/m as calculated from the following:   Height as of 12/07/17: 5' 7.5" (1.715 m).   Weight as of 12/07/17: 86.6 kg (191 lb).   Imaging Review Plain radiographs demonstrate severe degenerative joint disease of the right knee(s). The overall alignment ismild varus. The bone quality appears to be fair for age and reported activity level.  Anticipated LOS equal to or greater than 2 midnights due to - Age 49 and older with one or more of the following:  - Obesity  - ASA Class 3 and higher  - Expected need for hospital services (PT, OT, Nursing) required for safe  discharge  - Anticipated need for postoperative skilled nursing care or inpatient rehab  - Active co-morbidities:  Chronic pain requiring opiods and Cardiac issues requiring mechanical valve and chronic anticoagulation which will need to be reproduced in the hospital. OR   - Unanticipated findings during/Post Surgery: None  - Patient is a high risk of re-admission due to: None  The patient is on chronic anticoagulation and we will need time in the hospital to get his anticoagulation back to the appropriate status.  For this reason he will be in the hospital for more than 2 midnights.  The patient did undergo preoperative templating to help minimize a number of tries to be open and the surgical procedure.    Assessment/Plan:  End stage arthritis, right knee   The patient history, physical examination, clinical judgment of the provider and imaging studies are consistent with end stage degenerative joint disease of the right knee(s) and total knee arthroplasty is deemed medically necessary. The treatment options including medical management, injection therapy arthroscopy and arthroplasty were discussed at length. The risks and benefits of total knee arthroplasty were  presented and reviewed. The risks due to aseptic loosening, infection, stiffness, patella tracking problems, thromboembolic complications and other imponderables were discussed. The patient acknowledged the explanation, agreed to proceed with the plan and consent was signed. Patient is being admitted for inpatient treatment for surgery, pain control, PT, OT, prophylactic antibiotics, VTE prophylaxis, progressive ambulation and ADL's and discharge planning. The patient is planning to be discharged home with home health services

## 2017-12-10 DIAGNOSIS — M1711 Unilateral primary osteoarthritis, right knee: Secondary | ICD-10-CM | POA: Diagnosis not present

## 2017-12-10 LAB — BASIC METABOLIC PANEL
Anion gap: 10 (ref 5–15)
BUN: 16 mg/dL (ref 6–20)
CO2: 23 mmol/L (ref 22–32)
Calcium: 9.2 mg/dL (ref 8.9–10.3)
Chloride: 105 mmol/L (ref 101–111)
Creatinine, Ser: 0.84 mg/dL (ref 0.61–1.24)
GFR calc Af Amer: >60 mL/min (ref >60)
GFR calc non Af Amer: >60 mL/min (ref >60)
Glucose, Bld: 217 mg/dL — ABNORMAL HIGH (ref 65–99)
Potassium: 4.1 mmol/L (ref 3.5–5.1)
Sodium: 138 mmol/L (ref 135–145)

## 2017-12-10 LAB — CBC
HCT: 35.3 % — ABNORMAL LOW (ref 39.0–52.0)
Hemoglobin: 12.2 g/dL — ABNORMAL LOW (ref 13.0–17.0)
MCH: 28.9 pg (ref 26.0–34.0)
MCHC: 34.6 g/dL (ref 30.0–36.0)
MCV: 83.6 fL (ref 78.0–100.0)
Platelets: 242 10*3/uL (ref 150–400)
RBC: 4.22 MIL/uL (ref 4.22–5.81)
RDW: 13.2 % (ref 11.5–15.5)
WBC: 14.6 10*3/uL — ABNORMAL HIGH (ref 4.0–10.5)

## 2017-12-10 LAB — PROTIME-INR
INR: 1.01
Prothrombin Time: 13.2 seconds (ref 11.4–15.2)

## 2017-12-10 LAB — GLUCOSE, CAPILLARY
Glucose-Capillary: 168 mg/dL — ABNORMAL HIGH (ref 65–99)
Glucose-Capillary: 165 mg/dL — ABNORMAL HIGH (ref 65–99)
Glucose-Capillary: 172 mg/dL — ABNORMAL HIGH (ref 65–99)
Glucose-Capillary: 159 mg/dL — ABNORMAL HIGH (ref 65–99)

## 2017-12-10 NOTE — Evaluation (Signed)
Physical Therapy Evaluation Patient Details Name: Dennis Bates MRN: [id number] DOB: 09-21-1960 Today's Date: 12/10/2017   History of Present Illness  RTKA, retained hardware 12/09/17  Clinical Impression  The patient ambulated x 200', able to progress without KI. Patient  Has 2 level home, , will stay on first level initilaly. Pt admitted with above diagnosis. Pt currently with functional limitations due to the deficits listed below (see PT Problem List).  Pt will benefit from skilled PT to increase their independence and safety with mobility to allow discharge to the venue listed below.       Follow Up Recommendations Home health PT;Follow surgeon's recommendation for DC plan and follow-up therapies    Equipment Recommendations  Rolling walker with 5" wheels;Cane;Crutches(crutches for second floor steps)    Recommendations for Other Services       Precautions / Restrictions Precautions Precautions: Knee;Fall Precaution Comments: removed KI while ambulating Required Braces or Orthoses: Knee Immobilizer - Right Knee Immobilizer - Right: Discontinue once straight leg raise with < 10 degree lag      Mobility  Bed Mobility Overal bed mobility: Needs Assistance Bed Mobility: Sit to Supine;Supine to Sit     Supine to sit: Min guard     General bed mobility comments: self manages the right leg  Transfers Overall transfer level: Needs assistance Equipment used: Rolling walker (2 wheeled) Transfers: Sit to/from Stand Sit to Stand: Min assist         General transfer comment: cues for hand and right leg position  Ambulation/Gait Ambulation/Gait assistance: Min assist Ambulation Distance (Feet): 200 Feet Assistive device: Rolling walker (2 wheeled) Gait Pattern/deviations: Step-to pattern;Step-through pattern;Decreased step length - right;Decreased stance time - right     General Gait Details: gradually , patient became more confident in R knee, removed KI with patient   demonstrating sing through gait pattern.   Stairs            Wheelchair Mobility    Modified Rankin (Stroke Patients Only)       Balance                                             Pertinent Vitals/Pain Pain Score: 4  Pain Location: right knee Pain Descriptors / Indicators: Aching;Discomfort;Tender Pain Intervention(s): Repositioned;Ice applied;Monitored during session;Premedicated before session    Home Living Family/patient expects to be discharged to:: Private residence Living Arrangements: Spouse/significant other Available Help at Discharge: Family Type of Home: House Home Access: Stairs to enter Entrance Stairs-Rails: None Entrance Stairs-Number of Steps: 3 Home Layout: Full bath on main level(with claw tub)   Additional Comments: clawfooted tub both , ? with HH shower per patient, palns to stay on first level    Prior Function Level of Independence: Independent               Hand Dominance        Extremity/Trunk Assessment        Lower Extremity Assessment Lower Extremity Assessment: RLE deficits/detail RLE Deficits / Details: + SLR, 10-50 knee flexion    Cervical / Trunk Assessment Cervical / Trunk Assessment: Normal  Communication   Communication: No difficulties  Cognition Arousal/Alertness: Awake/alert Behavior During Therapy: WFL for tasks assessed/performed Overall Cognitive Status: Within Functional Limits for tasks assessed  General Comments      Exercises Total Joint Exercises Ankle Circles/Pumps: AROM;Both;10 reps Quad Sets: AROM;Both;10 reps Short Arc Quad: AROM;Right;10 reps Heel Slides: AAROM;Right;10 reps Hip ABduction/ADduction: AROM;Right;10 reps Straight Leg Raises: AROM;Right;10 reps Long Arc Quad: AROM;Right;10 reps Knee Flexion: AROM;Right;10 reps   Assessment/Plan    PT Assessment Patient needs continued PT services  PT Problem  List Decreased strength;Decreased range of motion;Decreased knowledge of use of DME;Decreased activity tolerance;Decreased safety awareness;Decreased knowledge of precautions;Decreased mobility;Pain       PT Treatment Interventions DME instruction;Therapeutic exercise;Gait training;Stair training;Functional mobility training;Therapeutic activities;Patient/family education    PT Goals (Current goals can be found in the Care Plan section)  Acute Rehab PT Goals Patient Stated Goal: to go home PT Goal Formulation: With patient Time For Goal Achievement: 12/16/17 Potential to Achieve Goals: Good    Frequency 7X/week   Barriers to discharge        Co-evaluation               AM-PAC PT "6 Clicks" Daily Activity  Outcome Measure Difficulty turning over in bed (including adjusting bedclothes, sheets and blankets)?: A Little Difficulty moving from lying on back to sitting on the side of the bed? : A Little Difficulty sitting down on and standing up from a chair with arms (e.g., wheelchair, bedside commode, etc,.)?: A Little Help needed moving to and from a bed to chair (including a wheelchair)?: A Little Help needed walking in hospital room?: A Lot Help needed climbing 3-5 steps with a railing? : Total 6 Click Score: 15    End of Session Equipment Utilized During Treatment: Right knee immobilizer Activity Tolerance: Patient tolerated treatment well Patient left: in chair Nurse Communication: Mobility status PT Visit Diagnosis: Unsteadiness on feet (R26.81);Pain Pain - Right/Left: Right Pain - part of body: Knee    Time: [id number] PT Time Calculation (min) (ACUTE ONLY): 65 min   Charges:   PT Evaluation $PT Eval Low Complexity: 1 Low PT Treatments $Gait Training: 23-37 mins $Therapeutic Exercise: 8-22 mins $Self Care/Home Management: 8-22   PT G CodesBlanchard Kelch:        Sharol Croghan PT [phone]   Rada HayHill, Brehanna Deveny Elizabeth 12/10/2017, 2:28 PM

## 2017-12-10 NOTE — Progress Notes (Signed)
ANTICOAGULATION CONSULT NOTE  Pharmacy Consult for Lovenox/Coumadin Indication: mechanical heart valve  No Known Allergies  Patient Measurements: Height: 5' 7.5" (171.5 cm) Weight: 191 lb (86.6 kg) IBW/kg (Calculated) : 67.25  Vital Signs: Temp: 97.3 F (36.3 C) (03/30 1029) Temp Source: Oral (03/30 1029) BP: 136/74 (03/30 1029) Pulse Rate: 71 (03/30 1029)  Labs: Recent Labs    12/10/17 0508  HGB 12.2*  HCT 35.3*  PLT 242  LABPROT 13.2  INR 1.01  CREATININE 0.84    Estimated Creatinine Clearance: 104.2 mL/min (by C-G formula based on SCr of 0.84 mg/dL).   Medications:  PTA Coumadin: 7.5mg  daily except 10mg  on MWF  Assessment: 57 yo M on chronic Coumadin for mechanical heart valve (goal 2-3 [facility]) who is s/p right TKA today. Coumadin was stopped on 3/21 for procedure & bridged with Lovenox 80 mg (see [facility] clinic notes)  Today, 12/10/2017:  H/H lower as expected postop; Plt lower but WNL  INR subtherapeutic with minimal change as expected after first dose warfarin  No interacting medications other than ASA which patient takes PTA with warfarin  SCr improved; CrCl > 30 ml/min  Goal of Therapy:  INR 2-3 Anti-Xa level 0.6-1 units/ml 4hrs after LMWH dose given Monitor platelets by anticoagulation protocol: Yes   Plan: (as [facility])  Lovenox 80mg  sq q12h  Coumadin 10mg  po daily  Daily INR  CBC q3days while on Lovenox  Bernadene Personrew Carmelina Balducci, PharmD, BCPS [phone] 12/10/2017, 10:52 AM

## 2017-12-10 NOTE — Progress Notes (Signed)
Subjective: 1 Day Post-Op Procedure(s) (LRB): RIGHT TOTAL KNEE ARTHROPLASTY REMOVAL OF HARDWARE RIGHT KNEE (Right)   Patient resting comfortably in chair getting ready to start PT.  Activity level:  wbat Diet tolerance:  ok Voiding:  ok Patient reports pain as mild.    Objective: Vital signs in last 24 hours: Temp:  [96.8 F (36 C)-98.1 F (36.7 C)] 98 F (36.7 C) (03/30 16100642) Pulse Rate:  [62-77] 77 (03/30 0642) Resp:  [10-23] 14 (03/30 0642) BP: (107-130)/(64-87) 118/71 (03/30 0642) SpO2:  [98 %-100 %] 99 % (03/30 0642)  Labs: Recent Labs    12/07/17 0937 12/10/17 0508  HGB 15.0 12.2*   Recent Labs    12/07/17 0937 12/10/17 0508  WBC 5.2 14.6*  RBC 5.17 4.22  HCT 44.4 35.3*  PLT 313 242   Recent Labs    12/07/17 0937 12/10/17 0508  NA 137 138  K 5.5* 4.1  CL 102 105  CO2 25 23  BUN 14 16  CREATININE 1.22 0.84  GLUCOSE 135* 217*  CALCIUM 9.9 9.2   Recent Labs    12/07/17 0937 12/10/17 0508  INR 0.91 1.01    Physical Exam:  Neurologically intact ABD soft Neurovascular intact Sensation intact distally Intact pulses distally Dorsiflexion/Plantar flexion intact Incision: dressing C/D/I and no drainage No cellulitis present Compartment soft  Assessment/Plan:  1 Day Post-Op Procedure(s) (LRB): RIGHT TOTAL KNEE ARTHROPLASTY REMOVAL OF HARDWARE RIGHT KNEE (Right) Advance diet Up with therapy Plan for discharge tomorrow Discharge home with home health  Contniue on 80mg  lovenox BID until INR theraputic Also continue 10mg  coumadin until therapeutic per coumadin clinic and pharmacy consult. Follow up with Dr. Luiz BlareGraves in office 2 weeks post op.  Dennis Bates, Ginger OrganNDREW PAUL 12/10/2017, 8:43 AM

## 2017-12-10 NOTE — Progress Notes (Signed)
Physical Therapy Treatment Patient Details Name: Dennis Bates MRN: [id number] DOB: 03-25-1961 Today's Date: 12/10/2017    History of Present Illness RTKA, retained hardware 12/09/17    PT Comments    Patient reports increased pain, Has continued to work on ROM and  Exercises Independently. Progressing well. Plan practice steps next visit.    Follow Up Recommendations  Home health PT;Follow surgeon's recommendation for DC plan and follow-up therapies     Equipment Recommendations  Rolling walker with 5" wheels;Cane;Crutches    Recommendations for Other Services       Precautions / Restrictions Precautions Precautions: Knee;Fall Precaution Comments: removed KI- will use for steps next day Required Braces or Orthoses: Knee Immobilizer - Right Knee Immobilizer - Right: Discontinue once straight leg raise with < 10 degree lag    Mobility  Bed Mobility Overal bed mobility: Modified Independent Bed Mobility: Sit to Supine;Supine to Sit     Supine to sit: Min guard     General bed mobility comments: self manages the right leg  Transfers Overall transfer level: Needs assistance Equipment used: Rolling walker (2 wheeled) Transfers: Sit to/from Stand Sit to Stand: Supervision         General transfer comment: cues for hand and right leg position  Ambulation/Gait Ambulation/Gait assistance: Min guard Ambulation Distance (Feet): 150 Feet Assistive device: Rolling walker (2 wheeled) Gait Pattern/deviations: Step-to pattern;Step-through pattern;Decreased step length - right;Decreased stance time - right     General Gait Details: gradually , patient became more confident in R knee, removed KI with patient  demonstrating sing through gait pattern.    Stairs            Wheelchair Mobility    Modified Rankin (Stroke Patients Only)       Balance                                            Cognition Arousal/Alertness: Awake/alert Behavior  During Therapy: WFL for tasks assessed/performed Overall Cognitive Status: Within Functional Limits for tasks assessed                                        Exercises Long Arc Quad: AROM;Right;10 reps;Seated Knee Flexion: AROM;Right;10 reps;Seated    General Comments        Pertinent Vitals/Pain Pain Score: 6  Pain Location: right knee Pain Descriptors / Indicators: Aching;Discomfort;Tender Pain Intervention(s): Monitored during session;Premedicated before session;Repositioned;Patient requesting pain meds-RN notified    Home Living Family/patient expects to be discharged to:: Private residence Living Arrangements: Spouse/significant other Available Help at Discharge: Family Type of Home: House Home Access: Stairs to enter Entrance Stairs-Rails: None Home Layout: Full bath on main level(with claw tub)   Additional Comments: clawfooted tub both , ? with HH shower per patient, plans to stay on first level    Prior Function Level of Independence: Independent          PT Goals (current goals can now be found in the care plan section) Acute Rehab PT Goals Patient Stated Goal: to go home PT Goal Formulation: With patient Time For Goal Achievement: 12/16/17 Potential to Achieve Goals: Good Progress towards PT goals: Progressing toward goals    Frequency    7X/week      PT Plan Current plan remains appropriate  Co-evaluation              AM-PAC PT "6 Clicks" Daily Activity  Outcome Measure  Difficulty turning over in bed (including adjusting bedclothes, sheets and blankets)?: None Difficulty moving from lying on back to sitting on the side of the bed? : None Difficulty sitting down on and standing up from a chair with arms (e.g., wheelchair, bedside commode, etc,.)?: A Little Help needed moving to and from a bed to chair (including a wheelchair)?: A Little Help needed walking in hospital room?: A Little Help needed climbing 3-5 steps with  a railing? : Total 6 Click Score: 18    End of Session Equipment Utilized During Treatment: Right knee immobilizer Activity Tolerance: Patient tolerated treatment well Patient left: in bed;with call bell/phone within reach;with family/visitor present Nurse Communication: Mobility status;Patient requests pain meds PT Visit Diagnosis: Unsteadiness on feet (R26.81);Pain Pain - Right/Left: Right Pain - part of body: Knee     Time: 4098-1191 PT Time Calculation (min) (ACUTE ONLY): 33 min  Charges:  $Gait Training: 8-22 mins $Therapeutic Exercise: 8-22 mins                   G Codes:          Rada Hay 12/10/2017, 5:16 PM

## 2017-12-11 DIAGNOSIS — M1711 Unilateral primary osteoarthritis, right knee: Secondary | ICD-10-CM | POA: Diagnosis not present

## 2017-12-11 LAB — CBC
HCT: 34.5 % — ABNORMAL LOW (ref 39.0–52.0)
Hemoglobin: 11.9 g/dL — ABNORMAL LOW (ref 13.0–17.0)
MCH: 28.9 pg (ref 26.0–34.0)
MCHC: 34.5 g/dL (ref 30.0–36.0)
MCV: 83.7 fL (ref 78.0–100.0)
Platelets: 248 10*3/uL (ref 150–400)
RBC: 4.12 MIL/uL — ABNORMAL LOW (ref 4.22–5.81)
RDW: 13.4 % (ref 11.5–15.5)
WBC: 13.5 10*3/uL — ABNORMAL HIGH (ref 4.0–10.5)

## 2017-12-11 LAB — GLUCOSE, CAPILLARY
Glucose-Capillary: 149 mg/dL — ABNORMAL HIGH (ref 65–99)
Glucose-Capillary: 156 mg/dL — ABNORMAL HIGH (ref 65–99)
Glucose-Capillary: 147 mg/dL — ABNORMAL HIGH (ref 65–99)
Glucose-Capillary: 127 mg/dL — ABNORMAL HIGH (ref 65–99)

## 2017-12-11 LAB — PROTIME-INR
INR: 1.08
Prothrombin Time: 13.9 seconds (ref 11.4–15.2)

## 2017-12-11 NOTE — Progress Notes (Signed)
Subjective: 2 Days Post-Op Procedure(s) (LRB): RIGHT TOTAL KNEE ARTHROPLASTY REMOVAL OF HARDWARE RIGHT KNEE (Right)   Patinet feels very tired today and would like to stay one more day.  Activity level:  wbat Diet tolerance:  ok Voiding:  ok Patient reports pain as mild and moderate.    Objective: Vital signs in last 24 hours: Temp:  [97.3 F (36.3 C)-98.7 F (37.1 C)] 98.4 F (36.9 C) (03/31 0558) Pulse Rate:  [71-81] 75 (03/31 0558) Resp:  [14-18] 15 (03/31 0558) BP: (128-148)/(70-75) 148/70 (03/31 0558) SpO2:  [97 %-99 %] 99 % (03/31 0558)  Labs: Recent Labs    12/10/17 0508 12/11/17 0501  HGB 12.2* 11.9*   Recent Labs    12/10/17 0508 12/11/17 0501  WBC 14.6* 13.5*  RBC 4.22 4.12*  HCT 35.3* 34.5*  PLT 242 248   Recent Labs    12/10/17 0508  NA 138  K 4.1  CL 105  CO2 23  BUN 16  CREATININE 0.84  GLUCOSE 217*  CALCIUM 9.2   Recent Labs    12/10/17 0508 12/11/17 0501  INR 1.01 1.08    Physical Exam:  Neurologically intact ABD soft Neurovascular intact Sensation intact distally Intact pulses distally Dorsiflexion/Plantar flexion intact Incision: dressing C/D/I and no drainage No cellulitis present  Assessment/Plan:  2 Days Post-Op Procedure(s) (LRB): RIGHT TOTAL KNEE ARTHROPLASTY REMOVAL OF HARDWARE RIGHT KNEE (Right) Advance diet Up with therapy D/C IV fluids Discharge home with home health if doing well Contniue on 80mg  lovenox BID until INR theraputic Also continue 10mg  coumadin until therapeutic per coumadin clinic and pharmacy consult. Follow up with Dr. Luiz BlareGraves in office 2 weeks post op.  Delorise Hunkele, Ginger OrganNDREW PAUL 12/11/2017, 10:10 AM

## 2017-12-11 NOTE — Progress Notes (Signed)
Physical Therapy Treatment Patient Details Name: Dennis Bates MRN: [id number] DOB: [birth year] Today's Date: 12/11/2017    History of Present Illness RTKA, retained hardware 12/09/17    PT Comments    Patient progressing with mobility and able to negotiate stairs with crutches safely to simulate home entry.  Also reviewed HEP, progression of activity and car transfers.  No further skilled PT needs prior to d/c home with follow up PT.   Follow Up Recommendations  Home health PT;Follow surgeon's recommendation for DC plan and follow-up therapies     Equipment Recommendations  Rolling walker with 5" wheels;Crutches    Recommendations for Other Services       Precautions / Restrictions Precautions Precautions: Knee;Fall Precaution Comments: KI for steps only Required Braces or Orthoses: Knee Immobilizer - Right Knee Immobilizer - Right: On when out of bed or walking Restrictions Weight Bearing Restrictions: No    Mobility  Bed Mobility Overal bed mobility: Modified Independent             General bed mobility comments: in chair  Transfers Overall transfer level: Needs assistance Equipment used: Rolling walker (2 wheeled) Transfers: Sit to/from Stand Sit to Stand: Modified independent (Device/Increase time)         General transfer comment: pt attempting to stand before crutches given to him  Ambulation/Gait Ambulation/Gait assistance: Supervision Ambulation Distance (Feet): 200 Feet Assistive device: Rolling walker (2 wheeled) Gait Pattern/deviations: Decreased stride length;Step-through pattern;Antalgic;Step-to pattern     General Gait Details: cues and assist initially for step through pattern without circumduction. cues for walker management/positioning   Stairs Stairs: Yes   Stair Management: Forwards;With crutches Number of Stairs: 4 General stair comments: cues for technique, safety, adjusted crutches to height and pt able to manage with minguard  for safety  Wheelchair Mobility    Modified Rankin (Stroke Patients Only)       Balance Overall balance assessment: Needs assistance   Sitting balance-Leahy Scale: Good       Standing balance-Leahy Scale: Fair                              Cognition Arousal/Alertness: Awake/alert Behavior During Therapy: WFL for tasks assessed/performed Overall Cognitive Status: Within Functional Limits for tasks assessed                                        Exercises Total Joint Exercises Ankle Circles/Pumps: AROM;Both;10 reps Quad Sets: AROM;Both;10 reps Short Arc Quad: AROM;Right;10 reps Heel Slides: AAROM;Right;10 reps Hip ABduction/ADduction: AROM;Right;10 reps Straight Leg Raises: AROM;Right;10 reps Goniometric ROM: -10 - 35    General Comments General comments (skin integrity, edema, etc.): educated on sequence of pain meds, exercise and ice, marked reps on HEP and educated on car transfers      Pertinent Vitals/Pain Pain Assessment: Faces Pain Score: 3  Faces Pain Scale: Hurts even more Pain Location: right knee Pain Descriptors / Indicators: Aching;Discomfort;Tender Pain Intervention(s): Monitored during session;Repositioned;Ice applied    Home Living Family/patient expects to be discharged to:: Private residence Living Arrangements: Spouse/significant other Available Help at Discharge: Family Type of Home: House Home Access: Stairs to enter Entrance Stairs-Rails: None Home Layout: Full bath on main level(with claw tub) Home Equipment: Hand held shower head;Shower seat      Prior Function Level of Independence: Independent  PT Goals (current goals can now be found in the care plan section) Acute Rehab PT Goals Patient Stated Goal: to go home Progress towards PT goals: Progressing toward goals    Frequency    7X/week      PT Plan Current plan remains appropriate    Co-evaluation              AM-PAC PT  "6 Clicks" Daily Activity  Outcome Measure  Difficulty turning over in bed (including adjusting bedclothes, sheets and blankets)?: None Difficulty moving from lying on back to sitting on the side of the bed? : None Difficulty sitting down on and standing up from a chair with arms (e.g., wheelchair, bedside commode, etc,.)?: A Little Help needed moving to and from a bed to chair (including a wheelchair)?: A Little Help needed walking in hospital room?: A Little Help needed climbing 3-5 steps with a railing? : A Little 6 Click Score: 20    End of Session Equipment Utilized During Treatment: Right knee immobilizer Activity Tolerance: Patient tolerated treatment well Patient left: with call bell/phone within reach;in chair   PT Visit Diagnosis: Unsteadiness on feet (R26.81);Pain Pain - Right/Left: Right Pain - part of body: Knee     Time: 1610-9604 PT Time Calculation (min) (ACUTE ONLY): 42 min  Charges:  $Gait Training: 8-22 mins $Therapeutic Exercise: 8-22 mins $Therapeutic Activity: 8-22 mins                    G CodesSheran Lawless, [place] [phone] 12/11/2017    Elray Mcgregor 12/11/2017, 12:42 PM

## 2017-12-11 NOTE — Progress Notes (Addendum)
Physical Therapy Treatment Patient Details Name: Dennis Bates MRN: [id number] DOB: [birth year] Today's Date: 12/11/2017    History of Present Illness RTKA, retained hardware 12/09/17    PT Comments    Patient ambulating with wife in hallway, able to tolerate assisted knee flexion and CPM applied 5-45.  Encouraged to continue flexion, but mostly focus on extension for best outcome.  Appropriate for HHPT at d/c.   Follow Up Recommendations  Home health PT;Follow surgeon's recommendation for DC plan and follow-up therapies     Equipment Recommendations  Rolling walker with 5" wheels;Crutches    Recommendations for Other Services       Precautions / Restrictions Precautions Precautions: Knee;Fall Precaution Comments: KI for steps only Required Braces or Orthoses: Knee Immobilizer - Right Knee Immobilizer - Right: On when out of bed or walking    Mobility  Bed Mobility               General bed mobility comments: was up ambulating in hallway with wife, stated not d/c due to INR too low.  Educated wife on technique on stairs and gave handout  Transfers               Ambulation/Gait    Stairs   Wheelchair Mobility    Modified Rankin (Stroke Patients Only)       Balance Overall balance assessment: Needs assistance   Sitting balance-Leahy Scale: Good       Standing balance-Leahy Scale: Fair                              Cognition Arousal/Alertness: Awake/alert Behavior During Therapy: WFL for tasks assessed/performed Overall Cognitive Status: Within Functional Limits for tasks assessed                                        Exercises Total Joint Exercises Ankle Circles/Pumps: AROM;Both;10 reps Quad Sets: AROM;Both;10 reps Heel Slides: AAROM;Right;10 reps Straight Leg Raises: AROM;Right;10 reps     General Comments General comments (skin integrity, edema, etc.): educated on sequence of pain meds, exercise and  ice, marked reps on HEP and educated on car transfers      Pertinent Vitals/Pain Pain Score: 3  Faces Pain Scale: Hurts little more Pain Location: right knee Pain Descriptors / Indicators: Aching;Discomfort;Sore Pain Intervention(s): Monitored during session;Repositioned    Home Living                      Prior Function            PT Goals (current goals can now be found in the care plan section) Progress towards PT goals: Progressing toward goals    Frequency    7X/week      PT Plan Current plan remains appropriate    Co-evaluation              AM-PAC PT "6 Clicks" Daily Activity  Outcome Measure  Difficulty turning over in bed (including adjusting bedclothes, sheets and blankets)?: None Difficulty moving from lying on back to sitting on the side of the bed? : None Difficulty sitting down on and standing up from a chair with arms (e.g., wheelchair, bedside commode, etc,.)?: A Little Help needed moving to and from a bed to chair (including a wheelchair)?: A Little Help needed walking in hospital room?: A Little Help needed  climbing 3-5 steps with a railing? : A Little 6 Click Score: 20    End of Session Equipment Utilized During Treatment: Right knee immobilizer Activity Tolerance: Patient tolerated treatment well Patient left: in bed;with call bell/phone within reach;with family/visitor present   PT Visit Diagnosis: Unsteadiness on feet (R26.81);Pain Pain - Right/Left: Right Pain - part of body: Knee     Time: 1520-1535 PT Time Calculation (min) (ACUTE ONLY): 15 min  Charges:  $Therapeutic Exercise: 8-22 mins                     G CodesSheran Lawless, [place] [phone] 12/11/2017    Elray Mcgregor 12/11/2017, 4:09 PM

## 2017-12-11 NOTE — Evaluation (Signed)
Occupational Therapy Evaluation Patient Details Name: Jonas Goh MRN: [id number] DOB: Oct 04, 1960 Today's Date: 12/11/2017    History of Present Illness RTKA, retained hardware 12/09/17   Clinical Impression   Pt was independent prior to admission. Educated pt in home safety and fall prevention as well as how to transfer to his claw foot tub using the shower seat he has for when his father in law visits. Pt using crutches with min guard to ambulate to bathroom and up to chair.     Follow Up Recommendations  No OT follow up    Equipment Recommendations  None recommended by OT    Recommendations for Other Services       Precautions / Restrictions Precautions Precautions: Knee;Fall Required Braces or Orthoses: Knee Immobilizer - Right Knee Immobilizer - Right: On when out of bed or walking Restrictions Weight Bearing Restrictions: No      Mobility Bed Mobility Overal bed mobility: Modified Independent             General bed mobility comments: self manages the right leg  Transfers Overall transfer level: Needs assistance Equipment used: Crutches Transfers: Sit to/from Stand Sit to Stand: Min guard         General transfer comment: pt attempting to stand before crutches given to him    Balance                                           ADL either performed or assessed with clinical judgement   ADL Overall ADL's : Needs assistance/impaired Eating/Feeding: Independent;Sitting   Grooming: Wash/dry hands;Standing;Min guard   Upper Body Bathing: Set up;Sitting   Lower Body Bathing: Min guard;Sit to/from stand   Upper Body Dressing : Set up;Sitting   Lower Body Dressing: Min guard;Sit to/from stand   Toilet Transfer: Min guard;Ambulation(crutches)         Tub/Shower Transfer Details (indicate cue type and reason): educated in set up of tub seat and strategy for transfer avoiding stepping over edge of tub Functional mobility during  ADLs: Min guard(crutches)       Vision Baseline Vision/History: No visual deficits Patient Visual Report: No change from baseline       Perception     Praxis      Pertinent Vitals/Pain Pain Assessment: Faces Faces Pain Scale: Hurts even more Pain Location: right knee Pain Descriptors / Indicators: Aching;Discomfort;Tender Pain Intervention(s): Repositioned     Hand Dominance Right   Extremity/Trunk Assessment Upper Extremity Assessment Upper Extremity Assessment: Overall WFL for tasks assessed   Lower Extremity Assessment Lower Extremity Assessment: Defer to PT evaluation   Cervical / Trunk Assessment Cervical / Trunk Assessment: Normal   Communication Communication Communication: No difficulties   Cognition Arousal/Alertness: Awake/alert Behavior During Therapy: WFL for tasks assessed/performed Overall Cognitive Status: Within Functional Limits for tasks assessed                                     General Comments       Exercises     Shoulder Instructions      Home Living Family/patient expects to be discharged to:: Private residence Living Arrangements: Spouse/significant other Available Help at Discharge: Family Type of Home: House Home Access: Stairs to enter Secretary/administrator of Steps: 3 Entrance Stairs-Rails: None Home Layout: Full bath on  main level(with claw tub) Alternate Level Stairs-Number of Steps: 14 Alternate Level Stairs-Rails: Right Bathroom Shower/Tub: Chief Strategy OfficerTub/shower unit   Bathroom Toilet: Standard     Home Equipment: Hand held shower head;Shower seat          Prior Functioning/Environment Level of Independence: Independent                 OT Problem List: Impaired balance (sitting and/or standing)      OT Treatment/Interventions:      OT Goals(Current goals can be found in the care plan section) Acute Rehab OT Goals Patient Stated Goal: to go home  OT Frequency:     Barriers to D/C:             Co-evaluation              AM-PAC PT "6 Clicks" Daily Activity     Outcome Measure Help from another person eating meals?: None Help from another person taking care of personal grooming?: A Little Help from another person toileting, which includes using toliet, bedpan, or urinal?: A Little Help from another person bathing (including washing, rinsing, drying)?: A Little Help from another person to put on and taking off regular upper body clothing?: None Help from another person to put on and taking off regular lower body clothing?: A Little 6 Click Score: 20   End of Session Equipment Utilized During Treatment: Gait belt  Activity Tolerance: Patient tolerated treatment well Patient left: in chair;with call bell/phone within reach  OT Visit Diagnosis: Unsteadiness on feet (R26.81)                Time: [id number] OT Time Calculation (min): 16 min Charges:  OT General Charges $OT Visit: 1 Visit OT Evaluation $OT Eval Low Complexity: 1 Low G-Codes:     Evern BioMayberry, Jasimine Simms [place] 12/11/2017, 12:03 PM  12/11/2017 Martie RoundJulie Zilah Villaflor, OTR/L Pager: [phone]

## 2017-12-11 NOTE — Progress Notes (Signed)
ANTICOAGULATION CONSULT NOTE  Pharmacy Consult for Lovenox/Coumadin Indication: mechanical heart valve  No Known Allergies  Patient Measurements: Height: 5' 7.5" (171.5 cm) Weight: 191 lb (86.6 kg) IBW/kg (Calculated) : 67.25  Vital Signs: Temp: 98.4 F (36.9 C) (03/31 0558) Temp Source: Oral (03/31 0558) BP: 148/70 (03/31 0558) Pulse Rate: 75 (03/31 0558)  Labs: Recent Labs    12/10/17 0508 12/11/17 0501  HGB 12.2* 11.9*  HCT 35.3* 34.5*  PLT 242 248  LABPROT 13.2 13.9  INR 1.01 1.08  CREATININE 0.84  --     Estimated Creatinine Clearance: 104.2 mL/min (by C-G formula based on SCr of 0.84 mg/dL).   Medications:  PTA Coumadin: 7.5mg  daily except 10mg  on MWF  Assessment: 57 yo M on chronic Coumadin for mechanical heart valve ([facility] who is s/p right TKA today. Coumadin was stopped on 3/21 for procedure & bridged with Lovenox 80 mg (see [facility] clinic notes)  Today, 12/11/2017:  H/H lower as expected postop; Plt lower but WNL  INR remains subtherapeutic but with incremental changes as expected  No interacting medications other than ASA which patient takes PTA with warfarin  SCr improved; CrCl > 30 ml/min  Goal of Therapy:  INR 2-3 Anti-Xa level 0.6-1 units/ml 4hrs after LMWH dose given Monitor platelets by anticoagulation protocol: Yes   [facility]  Lovenox 80 mg sq q12h  Coumadin 10 mg po daily  Daily INR  CBC q3days while on Lovenox  Bernadene Personrew Debora [place], PharmD, BCPS [phone] 12/11/2017, 12:41 PM

## 2017-12-12 ENCOUNTER — Encounter (HOSPITAL_COMMUNITY): Payer: Self-pay | Admitting: Orthopedic Surgery

## 2017-12-12 DIAGNOSIS — Z952 Presence of prosthetic heart valve: Secondary | ICD-10-CM

## 2017-12-12 DIAGNOSIS — M1711 Unilateral primary osteoarthritis, right knee: Secondary | ICD-10-CM | POA: Diagnosis not present

## 2017-12-12 LAB — GLUCOSE, CAPILLARY: Glucose-Capillary: 152 mg/dL — ABNORMAL HIGH (ref 65–99)

## 2017-12-12 LAB — CBC
HCT: 34.5 % — ABNORMAL LOW (ref 39.0–52.0)
Hemoglobin: 12 g/dL — ABNORMAL LOW (ref 13.0–17.0)
MCH: 29.1 pg (ref 26.0–34.0)
MCHC: 34.8 g/dL (ref 30.0–36.0)
MCV: 83.5 fL (ref 78.0–100.0)
Platelets: 250 10*3/uL (ref 150–400)
RBC: 4.13 MIL/uL — ABNORMAL LOW (ref 4.22–5.81)
RDW: 13.5 % (ref 11.5–15.5)
WBC: 10.5 10*3/uL (ref 4.0–10.5)

## 2017-12-12 LAB — PROTIME-INR
INR: 1.23
Prothrombin Time: 15.4 seconds — ABNORMAL HIGH (ref 11.4–15.2)

## 2017-12-12 NOTE — Discharge Summary (Signed)
Patient ID: Dennis Bates MRN: [id number] DOB/AGE: 56-07-1961 56 y.o.  Admit date: 12/09/2017 Discharge date: 12/12/2017  Admission Diagnoses:  Principal Problem:   Primary osteoarthritis of right knee Active Problems:   Retained orthopedic hardware   Primary osteoarthritis of left knee   Mechanical heart valve present   Discharge Diagnoses:  Same  Past Medical History:  Diagnosis Date  . Aortic stenosis due to bicuspid aortic valve    Severe aortic stenosis of bicuspid valve - s/pAVR with [place] ( 09/2015)  . Bell's palsy    LAST EPISODE 09-2015  . CAD S/P percutaneous coronary angioplasty 09/20/2014   a. inferolat STEMI ->> LHC-Angio: Proximal/ostial LAD 20%, OM2 99% -->> PCI: 3mm x 16 mm Promus Premier DES to the OM2.(~3.5 mm)  . Diabetes mellitus type 2, controlled, with complications (HCC)   . Hyperlipidemia   . Hypertension   . Hypertriglyceridemia   . ST elevation myocardial infarction (STEMI) of inferior wall (HCC) 09/20/2014   99 % occluded Cx-OM2 Promus DES 3.0 mm x 16 mm (3.5 mm)    Surgeries: Procedure(s): RIGHT TOTAL KNEE ARTHROPLASTY REMOVAL OF HARDWARE RIGHT KNEE on 12/09/2017   Consultants: None  Discharged Condition: Improved  Hospital Course: Dennis Bates is an 57 y.o. male who was admitted 12/09/2017 for operative treatment ofPrimary osteoarthritis of right knee. Patient has severe unremitting pain that affects sleep, daily activities, and work/hobbies. After pre-op clearance the patient was taken to the operating room on 12/09/2017 and underwent  Procedure(s): RIGHT TOTAL KNEE ARTHROPLASTY REMOVAL OF HARDWARE RIGHT KNEE.  The patient was kept in the hospital until postop day #3 as he was being bridged from Lovenox back on to Coumadin which she was on preoperatively for mechanical heart valve.  His INR was very low on postop day #2 around 1.0 on postop day #3 his INR was 1.23.  The patient did well without any preoperative complications.  His vital signs are  stable and his right knee dressing was clean and dry at the time of discharge.  Patient was given perioperative antibiotics:  Anti-infectives (From admission, onward)   Start     Dose/Rate Route Frequency Ordered Stop   12/09/17 1400  ceFAZolin (ANCEF) IVPB 2g/100 mL premix     2 g 200 mL/hr over 30 Minutes Intravenous Every 6 hours 12/09/17 1330 12/09/17 2131   12/09/17 0751  ceFAZolin (ANCEF) IVPB 2g/100 mL premix     2 g 200 mL/hr over 30 Minutes Intravenous On call to O.R. 12/09/17 0865 12/09/17 0920       Patient was given sequential compression devices, early ambulation, and chemoprophylaxis to prevent DVT.  Patient benefited maximally from hospital stay and there were no complications.    Recent vital signs:  Patient Vitals for the past 24 hrs:  BP Temp Temp src Pulse Resp SpO2  12/12/17 0531 (!) 143/80 98.6 F (37 C) Oral 71 16 95 %  12/11/17 2157 (!) 154/82 98.1 F (36.7 C) Oral 68 17 97 %     Recent laboratory studies:  Recent Labs    12/10/17 0508 12/11/17 0501 12/12/17 0528  WBC 14.6* 13.5* 10.5  HGB 12.2* 11.9* 12.0*  HCT 35.3* 34.5* 34.5*  PLT 242 248 250  NA 138  --   --   K 4.1  --   --   CL 105  --   --   CO2 23  --   --   BUN 16  --   --   CREATININE 0.84  --   --  GLUCOSE 217*  --   --   INR 1.01 1.08 1.23  CALCIUM 9.2  --   --      Discharge Medications:   Allergies as of 12/12/2017   No Known Allergies     Medication List    TAKE these medications   acetaminophen 325 MG tablet Commonly known as:  TYLENOL Take 2 tablets (650 mg total) by mouth every 6 (six) hours as needed for mild pain.   aspirin 81 MG EC tablet Take 1 tablet (81 mg total) by mouth daily. What changed:  when to take this   atorvastatin 20 MG tablet Commonly known as:  LIPITOR Take 1 tablet (20 mg total) by mouth daily. What changed:  how much to take   carvedilol 3.125 MG tablet Commonly known as:  COREG Take 1 tablet (3.125 mg total) by mouth 2 (two) times  daily.   docusate sodium 100 MG capsule Commonly known as:  COLACE Take 1 capsule (100 mg total) by mouth 2 (two) times daily.   enoxaparin 80 MG/0.8ML injection Commonly known as:  LOVENOX Inject 0.8 mLs (80 mg total) into the skin every 12 (twelve) hours. As directed by coumadin clinic   FARXIGA 10 MG Tabs tablet Generic drug:  dapagliflozin propanediol Take 10 mg by mouth daily.   fenofibrate 160 MG tablet Take 1 tablet (160 mg total) by mouth daily.   glipiZIDE 5 MG 24 hr tablet Commonly known as:  GLUCOTROL XL Take 1 tablet (5 mg total) by mouth daily with breakfast.   metFORMIN 1000 MG tablet Commonly known as:  GLUCOPHAGE Take 1,000 mg by mouth 2 (two) times daily with a meal.   MULTIVITAMIN GUMMIES ADULT PO Take 2 each by mouth daily.   oxyCODONE-acetaminophen 5-325 MG tablet Commonly known as:  PERCOCET/ROXICET Take 1-2 tablets by mouth every 6 (six) hours as needed for severe pain.   tiZANidine 2 MG tablet Commonly known as:  ZANAFLEX Take 1 tablet (2 mg total) by mouth every 8 (eight) hours as needed for muscle spasms.   warfarin 5 MG tablet Commonly known as:  COUMADIN Take as directed. If you are unsure how to take this medication, talk to your nurse or doctor. Original instructions:  Take 1.5 to 2 tablets by mouth daily or as directed by coumadin clinic What changed:    how much to take  how to take this  when to take this  additional instructions            Discharge Care Instructions  (From admission, onward)        Start     Ordered   12/12/17 0000  Weight bearing as tolerated    Question Answer Comment  Laterality right   Extremity Lower      12/12/17 0823      Diagnostic Studies: Dg Chest 2 View  Result Date: 12/07/2017 CLINICAL DATA:  Preop for knee surgery. EXAM: CHEST - 2 VIEW COMPARISON:  Radiographs of November 12, 2015. FINDINGS: The heart size and mediastinal contours are within normal limits. Status post cardiac valve  repair. Both lungs are clear. The visualized skeletal structures are unremarkable. IMPRESSION: No active cardiopulmonary disease. Electronically Signed   By: Dennis RaiderJames  Green Bates, M.D.   On: 12/07/2017 14:10    Disposition:   Discharge Instructions    CPM   Complete by:  As directed    Continuous passive motion machine (CPM):      Use the CPM from 0 to 60  for 8 hours per day.      You may increase by 5-10 per day.  You may break it up into 2 or 3 sessions per day.      Use CPM for 1-2 weeks or until you are told to stop.   Call MD / Call 911   Complete by:  As directed    If you experience chest pain or shortness of breath, CALL 911 and be transported to the hospital emergency room.  If you develope a fever above 101 F, pus (white drainage) or increased drainage or redness at the wound, or calf pain, call your surgeon's office.   Diet Carb Modified   Complete by:  As directed    Do not put a pillow under the knee. Place it under the heel.   Complete by:  As directed    Increase activity slowly as tolerated   Complete by:  As directed    Weight bearing as tolerated   Complete by:  As directed    Laterality:  right   Extremity:  Lower      Follow-up Information    Jodi Geralds, MD. Schedule an appointment as soon as possible for a visit in 3 days.   Specialty:  Orthopedic Surgery Contact information: [address] [place] [address] [phone]        [facility] Follow up.   Specialty:  Home Health Services Why:  physical therapy Contact information: [address] [place] [address] [phone]        [facility] - Dme Follow up.   Why:  rolling walker Contact information: [address] [place] [address] [phone]          The patient will follow up at the Coumadin clinic in 4-5 days.  He is instructed to call them for an appointment.  Signed: Matthew Folks 12/12/2017, 3:45 PM

## 2017-12-12 NOTE — Op Note (Signed)
NAME:  Dennis Bates, Dennis Bates                   ACCOUNT NO.:  MEDICAL RECORD NO.:  [id number]  LOCATION:                                 FACILITY:  PHYSICIAN:  Harvie Junior, M.D.        DATE OF BIRTH:  DATE OF PROCEDURE:  12/09/2017 DATE OF DISCHARGE:                              OPERATIVE REPORT   PREOPERATIVE DIAGNOSES: 1. End-stage degenerative joint disease, right knee, with severe bone-     on-bone change. 2. 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 3. Complexity of total knee replacement based on problematic hardware.  POSTOPERATIVE DIAGNOSES: 1. End-stage degenerative joint disease, right knee, with severe bone-     on-bone change. 2. 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 3. Complexity of total knee replacement based on problematic hardware.  SURGEON:  Harvie Junior, M.D.  ASSISTANT:  Marshia Ly, P.A.  ANESTHESIA:  Spinal.  BRIEF HISTORY:  Mr. Dennis Bates is a 57 year old male with a long history of having had significant complaints of right knee pain.  He had had previous ACL reconstructions x2.  He felt that his knee continued to be unstable and he had difficulty with it every time.  He developed severe arthritis.  We evaluated him.  We talked about treatment options, but felt that even though, he was a young man that knee replacement is going to be appropriate given the severe bone-on-bone changes.  There is going to be complexity we understood with the case because of the hardware and need for removal as well as preoperative planning about location and getting instruments to remove this hardware.  He is brought to the operating room for this procedure.  DESCRIPTION OF PROCEDURE:  The patient was brought to the operative room and after adequate anesthesia was obtained with the spinal anesthetic, placed the  patient on operating room table.  The right leg was then prepped and draped in usual sterile fashion.  Following this, the leg was exsanguinated.  Blood pressure tourniquet was inflated to 300 mmHg. Following this, attention was turned to the right knee which was prepped and draped in usual sterile fashion.  An incision was made for total knee replacement.  Subcutaneous tissue was dissected down to the level of the extensor mechanism and a medial parapatellar arthrotomy was undertaken.  Once this was accomplished, the anterior and posterior cruciates were removed.  The retropatellar fat pad was removed. Synovium on the anterior aspect of the femur was removed, and the medial and lateral menisci.  Once this was done, attention was turned to the femur where an intramedullary pilot hole was drilled.  A 4-degree valgus inclination cut was made with 9 mm of distal bone.  Following this, the femur sized to a 5, anterior and posterior cuts were made, chamfers, and then addressing the box that we did run into the screw.  We had the Arthrex screwdriver available and this was in fact able to be removed with a screwdriver.  At that point, it did leave a bit of a cavitary defect.  We  took the bone that we had from the femoral resection and fashioned it very nicely into a bone plug, tamped this back in place and then re-cut the areas around it, giving it actually a very nice bone grafting where the screw was had been placed.  Following this, there was also some suture that were removed from this all.  Following this, attention turned to the tibial side.  The tibia had a quite a significant distal screw which was removed with the washer.  There was some heaped up bone here; we used an osteotome to remove that we then moved towards making a perpendicular cut on the tibia.  Once this was done, attention was turned towards sizing it.  It was sized to a 6. Tremendous amount of osteophytes had to be removed.   We put the screws in, went to keel it and ran into the screw.  At that point, after a significant preoperative planning what I did, took a drill pin, put it through the screw, brought it out the cortex distally and then was able to put a guidewire through that screw I used a quarter-inch osteotome, chisel it around from the inside of the knee and then used a screwdriver to hammer back through the cortex and removed this screw in a retrograde fashion.  Screws were removed.  I left a bit of a cavitary defect.  We then fashioned the remaining bone plug that we had to complete via bone graft this defect from the inside.  As we were bone grafted the defect and hammering into place, we could actually see bone coming through the screw hole out where we had made a small rent in the cortex to get the screwdriver in to do this retrograde technique.  All of this had been preoperatively planned.  Once this was done, we then drilled and keeled the tibia, removed the remaining osteophytes around the tibia medially. Then, we trialed the femur with the five, a 7-mm bridging bearing seemed to be the appropriate fit, and then we cut the patella down to a level of 14 mm and I used a 38 all poly patella.  Once this was done, the knee was put through a range of motion.  Excellent range of motion and stability were achieved.  All trial components were removed.  The knee was copiously and thoroughly lavaged with pulse lavage irrigation and suctioned dry.  The final components were then cemented into place, size 6 tibia, size 5 femur, a 7 mm bridging bearing trial was placed.  A 38 all poly patella was placed and held with a clamp.  Once this was in place, the knee was put through a range of motion.  Excellent stability and range of motion were achieved.  At this point, the cement was allowed to completely harden, and all excess bone and cement were removed.  The tourniquet was then let down.  All bleeding was  controlled with electrocautery.  While the cement was drying, we did put Exparel throughout the synovial reflection for postoperative pain control.  Once this was all completed, attention was turned back towards removal of the trial and then a 7 final poly was opened and placed and then the knee was put through a range of motion.  Excellent stability and range of motion were achieved at this point.  The medial parapatellar arthrotomy was closed with #1 Vicryl running, skin with #0 and 2-0 Vicryl, and skin with staples because of his previous multiple surgeries.  Estimated blood loss for the procedure was minimal and there were no complications.  The total surgical time was certainly one and half times the normal surgical time just based on the complexity of the case.  Significant preoperative planning and templating had been done, which allowed for the removal of the screw and bone grafting to go very smoothly.     Harvie JuniorJohn L. Branda Chaudhary, M.D.     Ranae PlumberJLG/MEDQ  D:  12/09/2017  T:  12/09/2017  Job:  [id number]  cc:   Harvie JuniorJohn L. Jarris Kortz, M.D. Fax: [phone]

## 2017-12-12 NOTE — Plan of Care (Signed)
RN reviewed plan of care, as well as goals after knee replacement surgery.   RN reviewed discharge instructions with patient and family. All questions answered.   Paperwork and prescriptions given.   NT rolled patient down with all belongings to family car.

## 2017-12-12 NOTE — Discharge Instructions (Signed)
CPM instructions to patient/nursing Start CPM once delivered. Start CPM at 50 degrees, and increase 10 degrees daily, increase to 90 degrees if tolerated. Call Ortho office with any questions. Thank you   INSTRUCTIONS AFTER JOINT REPLACEMENT   o Remove items at home which could result in a fall. This includes throw rugs or furniture in walking pathways o ICE to the affected joint every three hours while awake for 30 minutes at a time, for at least the first 3-5 days, and then as needed for pain and swelling.  Continue to use ice for pain and swelling. You may notice swelling that will progress down to the foot and ankle.  This is normal after surgery.  Elevate your leg when you are not up walking on it.   o Continue to use the breathing machine you got in the hospital (incentive spirometer) which will help keep your temperature down.  It is common for your temperature to cycle up and down following surgery, especially at night when you are not up moving around and exerting yourself.  The breathing machine keeps your lungs expanded and your temperature down.   DIET:  As you were doing prior to hospitalization, we recommend a well-balanced diet.  DRESSING / WOUND CARE / SHOWERING  Keep the surgical dressing until follow up.  The dressing is water proof, so you can shower without any extra covering.  IF THE DRESSING FALLS OFF or the wound gets wet inside, change the dressing with sterile gauze.  Please use good hand washing techniques before changing the dressing.  Do not use any lotions or creams on the incision until instructed by your surgeon.    ACTIVITY  o Increase activity slowly as tolerated, but follow the weight bearing instructions below.   o No driving for 6 weeks or until further direction given by your physician.  You cannot drive while taking narcotics.  o No lifting or carrying greater than 10 lbs. until further directed by your surgeon. o Avoid periods of inactivity such as sitting  longer than an hour when not asleep. This helps prevent blood clots.  o You may return to work once you are authorized by your doctor.     WEIGHT BEARING   Weight bearing as tolerated with assist device (walker, cane, etc) as directed, use it as long as suggested by your surgeon or therapist, typically at least 4-6 weeks.   EXERCISES  Results after joint replacement surgery are often greatly improved when you follow the exercise, range of motion and muscle strengthening exercises prescribed by your doctor. Safety measures are also important to protect the joint from further injury. Any time any of these exercises cause you to have increased pain or swelling, decrease what you are doing until you are comfortable again and then slowly increase them. If you have problems or questions, call your caregiver or physical therapist for advice.   Rehabilitation is important following a joint replacement. After just a few days of immobilization, the muscles of the leg can become weakened and shrink (atrophy).  These exercises are designed to build up the tone and strength of the thigh and leg muscles and to improve motion. Often times heat used for twenty to thirty minutes before working out will loosen up your tissues and help with improving the range of motion but do not use heat for the first two weeks following surgery (sometimes heat can increase post-operative swelling).   These exercises can be done on a training (exercise) mat,  on the floor, on a table or on a bed. Use whatever works the best and is most comfortable for you.    Use music or television while you are exercising so that the exercises are a pleasant break in your day. This will make your life better with the exercises acting as a break in your routine that you can look forward to.   Perform all exercises about fifteen times, three times per day or as directed.  You should exercise both the operative leg and the other leg as  well.  Exercises include:    Quad Sets - Tighten up the muscle on the front of the thigh (Quad) and hold for 5-10 seconds.    Straight Leg Raises - With your knee straight (if you were given a brace, keep it on), lift the leg to 60 degrees, hold for 3 seconds, and slowly lower the leg.  Perform this exercise against resistance later as your leg gets stronger.   Leg Slides: Lying on your back, slowly slide your foot toward your buttocks, bending your knee up off the floor (only go as far as is comfortable). Then slowly slide your foot back down until your leg is flat on the floor again.   Angel Wings: Lying on your back spread your legs to the side as far apart as you can without causing discomfort.   Hamstring Strength:  Lying on your back, push your heel against the floor with your leg straight by tightening up the muscles of your buttocks.  Repeat, but this time bend your knee to a comfortable angle, and push your heel against the floor.  You may put a pillow under the heel to make it more comfortable if necessary.   A rehabilitation program following joint replacement surgery can speed recovery and prevent re-injury in the future due to weakened muscles. Contact your doctor or a physical therapist for more information on knee rehabilitation.    CONSTIPATION  Constipation is defined medically as fewer than three stools per week and severe constipation as less than one stool per week.  Even if you have a regular bowel pattern at home, your normal regimen is likely to be disrupted due to multiple reasons following surgery.  Combination of anesthesia, postoperative narcotics, change in appetite and fluid intake all can affect your bowels.   YOU MUST use at least one of the following options; they are listed in order of increasing strength to get the job done.  They are all available over the counter, and you may need to use some, POSSIBLY even all of these options:    Drink plenty of fluids  (prune juice may be helpful) and high fiber foods Colace 100 mg by mouth twice a day  Senokot for constipation as directed and as needed Dulcolax (bisacodyl), take with full glass of water  Miralax (polyethylene glycol) once or twice a day as needed.  If you have tried all these things and are unable to have a bowel movement in the first 3-4 days after surgery call either your surgeon or your primary doctor.    If you experience loose stools or diarrhea, hold the medications until you stool forms back up.  If your symptoms do not get better within 1 week or if they get worse, check with your doctor.  If you experience "the worst abdominal pain ever" or develop nausea or vomiting, please contact the office immediately for further recommendations for treatment.   ITCHING:  If you experience  itching with your medications, try taking only a single pain pill, or even half a pain pill at a time.  You can also use Benadryl over the counter for itching or also to help with sleep.   TED HOSE STOCKINGS:  Use stockings on both legs until for at least 2 weeks or as directed by physician office. They may be removed at night for sleeping.  MEDICATIONS:  See your medication summary on the After Visit Summary that nursing will review with you.  You may have some home medications which will be placed on hold until you complete the course of blood thinner medication.  It is important for you to complete the blood thinner medication as prescribed.  PRECAUTIONS:  If you experience chest pain or shortness of breath - call 911 immediately for transfer to the hospital emergency department.   If you develop a fever greater that 101 F, purulent drainage from wound, increased redness or drainage from wound, foul odor from the wound/dressing, or calf pain - CONTACT YOUR SURGEON.                                                   FOLLOW-UP APPOINTMENTS:  If you do not already have a post-op appointment, please call the  office for an appointment to be seen by your surgeon.  Guidelines for how soon to be seen are listed in your After Visit Summary, but are typically between 1-4 weeks after surgery.  OTHER INSTRUCTIONS:   Knee Replacement:  Do not place pillow under knee, focus on keeping the knee straight while resting. CPM instructions: 0-90 degrees, 2 hours in the morning, 2 hours in the afternoon, and 2 hours in the evening. Place foam block, curve side up under heel at all times except when in CPM or when walking.  DO NOT modify, tear, cut, or change the foam block in any way.  MAKE SURE YOU:   Understand these instructions.   Get help right away if you are not doing well or get worse.    Thank you for letting us be a part of your medical care team.  It is a privilege we respect greatly.  We hope these instructions will help you stay on track for a fast and full recovery!

## 2017-12-12 NOTE — Progress Notes (Signed)
Subjective: 3 Days Post-Op Procedure(s) (LRB): RIGHT TOTAL KNEE ARTHROPLASTY REMOVAL OF HARDWARE RIGHT KNEE (Right) Patient reports pain as moderate.  Taking by mouth.  Voiding okay.  Denies any chest pain or shortness of breath.  His knee feels "tight "he is ambulating. The patient was kept in the hospital due to the fact that he is bridging from a high dose of Lovenox back on to Coumadin since he has a mechanical heart valve.  He also is making moderately slow progress with physical therapy due to knee pain.  Objective: Vital signs in last 24 hours: Temp:  [98.1 F (36.7 C)-98.6 F (37 C)] 98.6 F (37 C) (04/01 0531) Pulse Rate:  [68-72] 71 (04/01 0531) Resp:  [16-17] 16 (04/01 0531) BP: (143-154)/(80-87) 143/80 (04/01 0531) SpO2:  [95 %-99 %] 95 % (04/01 0531)  Intake/Output from previous day: 03/31 0701 - 04/01 0700 In: 940 [P.O.:940] Out: 0  Intake/Output this shift: No intake/output data recorded.  Recent Labs    12/10/17 0508 12/11/17 0501 12/12/17 0528  HGB 12.2* 11.9* 12.0*   Recent Labs    12/11/17 0501 12/12/17 0528  WBC 13.5* 10.5  RBC 4.12* 4.13*  HCT 34.5* 34.5*  PLT 248 250   Recent Labs    12/10/17 0508  NA 138  K 4.1  CL 105  CO2 23  BUN 16  CREATININE 0.84  GLUCOSE 217*  CALCIUM 9.2   Recent Labs    12/11/17 0501 12/12/17 0528  INR 1.08 1.23  Right knee exam:  Neurologically intact Neurovascular intact Sensation intact distally Intact pulses distally Dorsiflexion/Plantar flexion intact Incision: dressing C/D/I Compartment soft  Assessment/Plan: 3 Days Post-Op Procedure(s) (LRB): RIGHT TOTAL KNEE ARTHROPLASTY REMOVAL OF HARDWARE RIGHT KNEE (Right) History of mechanical heart valve.  Transitioning from Lovenox back to Coumadin. Plan: Weight-bear as tolerated on right. Coumadin 10 mg daily.  Continue Lovenox 80 mg subcu twice daily until INR is therapeutic. Up with therapy Discharge home with home health Follow-up with  Coumadin clinic in 3-4 days. Follow-up with Dr. Luiz BlareGraves in 3 days.  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 Matthew FolksJames G Sabrena Gavitt 12/12/2017, 8:26 AM

## 2017-12-12 NOTE — Progress Notes (Signed)
ANTICOAGULATION CONSULT NOTE  Pharmacy Consult for Lovenox/Coumadin Indication: mechanical heart valve  No Known Allergies  Patient Measurements: Height: 5' 7.5" (171.5 cm) Weight: 191 lb (86.6 kg) IBW/kg (Calculated) : 67.25  Vital Signs: Temp: 98.6 F (37 C) (04/01 0531) Temp Source: Oral (04/01 0531) BP: 143/80 (04/01 0531) Pulse Rate: 71 (04/01 0531)  Labs: Recent Labs    12/10/17 0508 12/11/17 0501 12/12/17 0528  HGB 12.2* 11.9* 12.0*  HCT 35.3* 34.5* 34.5*  PLT 242 248 250  LABPROT 13.2 13.9 15.4*  INR 1.01 1.08 1.23  CREATININE 0.84  --   --     Estimated Creatinine Clearance: 104.2 mL/min (by C-G formula based on SCr of 0.84 mg/dL).   Medications:  PTA Coumadin: 7.5mg  daily except 10mg  on MWF  Assessment: 57 yo M on chronic Coumadin for mechanical heart valve (goal 2-3 [facility]) who is s/p right TKA today. Coumadin was stopped on 3/21 for procedure & bridged with Lovenox 80 mg (see [facility] clinic notes)  Today, 12/12/2017:  H/H low but stable; Plt  WNL  INR remains subtherapeutic but with incremental changes as expected  No interacting medications other than ASA which patient takes PTA with warfarin  SCr 0.84, CrCl >16700mls/min  Goal of Therapy:  INR 2-3 Anti-Xa level 0.6-1 units/ml 4hrs after LMWH dose given Monitor platelets by anticoagulation protocol: Yes   Plan: [facility])  Lovenox 80 mg sq q12h  Coumadin 10 mg po daily  Daily INR  CBC q3days while on Lovenox  Arley Phenixllen Ulonda Klosowski RPh 12/12/2017, 7:45 AM Pager [phone]

## 2017-12-12 NOTE — Progress Notes (Signed)
Physical Therapy Treatment Patient Details Name: Dennis Bates MRN: [id number] DOB: [birth year] Today's Date: 12/12/2017    History of Present Illness RTKA, retained hardware 12/09/17    PT Comments    POD # 3 am session R knee ROM limited to approx 45 degrees.  Instructed and performed knee flex TE's.  Still VERY tight and also noted bulky ACE wrap still on which also limits ROM. Reviewed all TKR TE's following HEP.  Encouraged knee flex TE's more.    Follow Up Recommendations  Home health PT;Follow surgeon's recommendation for DC plan and follow-up therapies     Equipment Recommendations  Rolling walker with 5" wheels;Crutches    Recommendations for Other Services       Precautions / Restrictions Precautions Precautions: Knee;Fall Precaution Comments: KI for steps only Required Braces or Orthoses: Knee Immobilizer - Right Knee Immobilizer - Right: On when out of bed or walking Restrictions Weight Bearing Restrictions: No Other Position/Activity Restrictions: WBAT    Mobility  Bed Mobility               General bed mobility comments: OOB in recliner  Transfers Overall transfer level: Needs assistance Equipment used: Rolling walker (2 wheeled) Transfers: Sit to/from Stand Sit to Stand: Modified independent (Device/Increase time)         General transfer comment: 25% VC's to increase knee flex during sit to stand and stand to sit to increase ROM  Ambulation/Gait Ambulation/Gait assistance: Supervision Ambulation Distance (Feet): 155 Feet Assistive device: Rolling walker (2 wheeled) Gait Pattern/deviations: Decreased stride length;Step-through pattern;Antalgic;Step-to pattern Gait velocity: decreased   General Gait Details: 25% VC's to increase heel staike and knee flex as well as equal stance with alternating gait.     Stairs            Wheelchair Mobility    Modified Rankin (Stroke Patients Only)       Balance                                             Cognition Arousal/Alertness: Awake/alert Behavior During Therapy: WFL for tasks assessed/performed Overall Cognitive Status: Within Functional Limits for tasks assessed                                        Exercises   Total Knee Replacement TE's 10 reps B LE ankle pumps 10 reps towel squeezes 10 reps knee presses 10 reps heel slides  10 reps SAQ's 10 reps SLR's 10 reps ABD Followed by ICE     General Comments        Pertinent Vitals/Pain Pain Assessment: 0-10 Pain Score: 6  Pain Location: right knee Pain Descriptors / Indicators: Aching;Discomfort;Sore;Operative site guarding Pain Intervention(s): Monitored during session;Premedicated before session;Ice applied    Home Living                      Prior Function            PT Goals (current goals can now be found in the care plan section) Progress towards PT goals: Progressing toward goals    Frequency    7X/week      PT Plan Current plan remains appropriate    Co-evaluation  AM-PAC PT "6 Clicks" Daily Activity  Outcome Measure  Difficulty turning over in bed (including adjusting bedclothes, sheets and blankets)?: None Difficulty moving from lying on back to sitting on the side of the bed? : None Difficulty sitting down on and standing up from a chair with arms (e.g., wheelchair, bedside commode, etc,.)?: A Little Help needed moving to and from a bed to chair (including a wheelchair)?: A Little Help needed walking in hospital room?: A Little Help needed climbing 3-5 steps with a railing? : A Little 6 Click Score: 20    End of Session Equipment Utilized During Treatment: Gait belt Activity Tolerance: Patient tolerated treatment well Patient left: in chair;with call bell/phone within reach;with family/visitor present Nurse Communication: (pt ready for D/C to home) PT Visit Diagnosis: Unsteadiness on feet (R26.81);Pain Pain  - Right/Left: Right Pain - part of body: Knee     Time: 1000-1025 PT Time Calculation (min) (ACUTE ONLY): 25 min  Charges:  $Gait Training: 8-22 mins $Therapeutic Exercise: 8-22 mins                    G Codes:       Felecia ShellingLori Ivy Meriwether  PTA [facility]  Acute  Rehab Pager      [phone]

## 2017-12-12 NOTE — Progress Notes (Signed)
Discharge planning, spoke with patient and spouse at beside. [facility] PT to eval and treat. Contacted [facility] for referral. Needs a RW, contacted [facility] to deliver to room. Notes indicate CPM needed at home, contacted rep, they already have orders. Patient is concerned with cost and may decline, rep to contact patient with cost prior to delivery. [phone]

## 2017-12-13 ENCOUNTER — Ambulatory Visit (INDEPENDENT_AMBULATORY_CARE_PROVIDER_SITE_OTHER): Payer: BC Managed Care – PPO | Admitting: Pharmacist

## 2017-12-13 DIAGNOSIS — Z7901 Long term (current) use of anticoagulants: Secondary | ICD-10-CM

## 2017-12-13 DIAGNOSIS — Z952 Presence of prosthetic heart valve: Secondary | ICD-10-CM | POA: Diagnosis not present

## 2017-12-13 LAB — POCT INR: INR: 1.2

## 2017-12-13 NOTE — Patient Instructions (Addendum)
Continue enoxaparin injection. Next INR tomorrow 4/3 with Home Care

## 2017-12-14 ENCOUNTER — Ambulatory Visit (INDEPENDENT_AMBULATORY_CARE_PROVIDER_SITE_OTHER): Payer: BC Managed Care – PPO | Admitting: Pharmacist Clinician (PhC)/ Clinical Pharmacy Specialist

## 2017-12-14 DIAGNOSIS — Z952 Presence of prosthetic heart valve: Secondary | ICD-10-CM | POA: Diagnosis not present

## 2017-12-14 DIAGNOSIS — Z7901 Long term (current) use of anticoagulants: Secondary | ICD-10-CM

## 2017-12-14 LAB — POCT INR: INR: 1.5

## 2017-12-16 ENCOUNTER — Ambulatory Visit (INDEPENDENT_AMBULATORY_CARE_PROVIDER_SITE_OTHER): Payer: BC Managed Care – PPO | Admitting: Pharmacist Clinician (PhC)/ Clinical Pharmacy Specialist

## 2017-12-16 DIAGNOSIS — Z7901 Long term (current) use of anticoagulants: Secondary | ICD-10-CM | POA: Diagnosis not present

## 2017-12-16 DIAGNOSIS — Z952 Presence of prosthetic heart valve: Secondary | ICD-10-CM

## 2017-12-16 LAB — POCT INR: INR: 1.8

## 2017-12-21 ENCOUNTER — Ambulatory Visit (INDEPENDENT_AMBULATORY_CARE_PROVIDER_SITE_OTHER): Payer: BC Managed Care – PPO | Admitting: Pharmacist Clinician (PhC)/ Clinical Pharmacy Specialist

## 2017-12-21 DIAGNOSIS — Z7901 Long term (current) use of anticoagulants: Secondary | ICD-10-CM

## 2017-12-21 DIAGNOSIS — Z952 Presence of prosthetic heart valve: Secondary | ICD-10-CM

## 2017-12-21 LAB — POCT INR: INR: 2.5

## 2017-12-28 ENCOUNTER — Ambulatory Visit (INDEPENDENT_AMBULATORY_CARE_PROVIDER_SITE_OTHER): Payer: BC Managed Care – PPO | Admitting: Pharmacist

## 2017-12-28 DIAGNOSIS — Z952 Presence of prosthetic heart valve: Secondary | ICD-10-CM | POA: Diagnosis not present

## 2017-12-28 DIAGNOSIS — Z7901 Long term (current) use of anticoagulants: Secondary | ICD-10-CM

## 2017-12-28 LAB — POCT INR: INR: 2.2

## 2018-01-12 NOTE — Op Note (Signed)
NAMEDONTAVIAN, MARCHI MEDICAL RECORD WG:[id number] ACCOUNT [id number] DATE OF BIRTH:1961-05-11 FACILITY: [facility] LOCATION: [facility] PHYSICIAN:Aryon Nham L. Brookelyn Gaynor, MD  OPERATIVE REPORT  DATE OF PROCEDURE:    ADDENDUM:  PROCEDURE: 1.  The procedure would be right total knee replacement with an Attune system, size 5 femur, size 6 tibia, 7 mm bridging bearing, and 38 mm all polyethylene patella. 2.  Complex removal of deep hardware associated with previous ACL reconstructions. 3.  Extensive preoperative planning and Prolonged surgical time with complexity of intraoperative decision making related to multiple previous difficult surgeries.  LN/NUANCE  D:01/12/2018 T:01/12/2018 JOB:[id number]

## 2018-01-30 ENCOUNTER — Telehealth: Payer: Self-pay | Admitting: Cardiology

## 2018-01-30 MED ORDER — AMOXICILLIN 500 MG PO CAPS: 2000.0000 mg | ORAL_CAPSULE | Freq: Once | ORAL | 3 refills | Status: AC

## 2018-01-30 NOTE — Telephone Encounter (Signed)
Pt having a toothache and will see the dentist today at 12p, they asked him to call to see if he needs abx prior due to artificial heart valve-pls call pt [phone]

## 2018-01-30 NOTE — Telephone Encounter (Signed)
Spoke to D.O.D ( CROITORU).  Patient needs  SBE PROTOCOL  For dental procedures. Patient is aware. Prescription e-sent to pharmacy. Patient states he reschedule appointment for tomorrow.

## 2018-06-20 ENCOUNTER — Telehealth: Payer: Self-pay | Admitting: Pharmacist Clinician (PhC)/ Clinical Pharmacy Specialist

## 2018-06-20 NOTE — Telephone Encounter (Signed)
LMOM to clarify who is following INR

## 2018-06-27 ENCOUNTER — Ambulatory Visit: Payer: Self-pay | Admitting: Pharmacist Clinician (PhC)/ Clinical Pharmacy Specialist

## 2018-06-27 DIAGNOSIS — Z7901 Long term (current) use of anticoagulants: Secondary | ICD-10-CM

## 2018-06-27 DIAGNOSIS — Z952 Presence of prosthetic heart valve: Secondary | ICD-10-CM

## 2018-06-27 NOTE — Telephone Encounter (Signed)
3rd message  Patient to call back and schedule warfarin f/u ASAP

## 2019-11-18 ENCOUNTER — Ambulatory Visit: Payer: Self-pay | Attending: Internal Medicine

## 2019-11-18 DIAGNOSIS — Z23 Encounter for immunization: Secondary | ICD-10-CM | POA: Insufficient documentation

## 2019-11-18 NOTE — Progress Notes (Signed)
   Covid-19 Vaccination Clinic  Name:  Dennis Bates    MRN: [id number] DOB: April 12, 1961  11/18/2019  [name] was observed post Covid-19 immunization for 15 minutes without incident. He was provided with Vaccine Information Sheet and instruction to access the V-Safe system.   Mr. Able was instructed to call 911 with any severe reactions post vaccine: Marland Kitchen Difficulty breathing  . Swelling of face and throat  . A fast heartbeat  . A bad rash all over body  . Dizziness and weakness   Immunizations Administered    Name Date Dose VIS Date Route   Pfizer COVID-19 Vaccine 11/18/2019  3:20 PM 0.3 mL 08/24/2019 Intramuscular   Manufacturer: [employer], [employer]   Lot: [id number]   NDC: [address]-6

## 2019-11-26 ENCOUNTER — Encounter: Payer: Self-pay | Admitting: Cardiology

## 2019-12-11 ENCOUNTER — Ambulatory Visit: Payer: BC Managed Care – PPO | Attending: Internal Medicine

## 2019-12-11 DIAGNOSIS — Z23 Encounter for immunization: Secondary | ICD-10-CM

## 2019-12-11 NOTE — Progress Notes (Signed)
   Covid-19 Vaccination Clinic  Name:  Dennis Bates    MRN: [id number] DOB: 1961-07-16  12/11/2019  [name] was observed post Covid-19 immunization for 15 minutes without incident. He was provided with Vaccine Information Sheet and instruction to access the V-Safe system.   [name] was instructed to call 911 with any severe reactions post vaccine: Marland Kitchen Difficulty breathing  . Swelling of face and throat  . A fast heartbeat  . A bad rash all over body  . Dizziness and weakness   Immunizations Administered    Name Date Dose VIS Date Route   Pfizer COVID-19 Vaccine 12/11/2019  9:47 AM 0.3 mL 08/24/2019 Intramuscular   Manufacturer: [employer], [employer]   Lot: [id number]   NDC: [address]-6

## 2019-12-13 HISTORY — PX: TRANSTHORACIC ECHOCARDIOGRAM: SHX275

## 2019-12-17 ENCOUNTER — Other Ambulatory Visit: Payer: Self-pay

## 2019-12-17 ENCOUNTER — Encounter: Payer: Self-pay | Admitting: Cardiology

## 2019-12-17 ENCOUNTER — Ambulatory Visit: Payer: BC Managed Care – PPO | Admitting: Cardiology

## 2019-12-17 VITALS — BP 116/72 | HR 72 | Ht 67.0 in | Wt 184.0 lb

## 2019-12-17 DIAGNOSIS — E785 Hyperlipidemia, unspecified: Secondary | ICD-10-CM

## 2019-12-17 DIAGNOSIS — Q231 Congenital insufficiency of aortic valve: Secondary | ICD-10-CM | POA: Diagnosis not present

## 2019-12-17 DIAGNOSIS — Z9861 Coronary angioplasty status: Secondary | ICD-10-CM

## 2019-12-17 DIAGNOSIS — I2119 ST elevation (STEMI) myocardial infarction involving other coronary artery of inferior wall: Secondary | ICD-10-CM

## 2019-12-17 DIAGNOSIS — I2510 Atherosclerotic heart disease of native coronary artery without angina pectoris: Secondary | ICD-10-CM | POA: Diagnosis not present

## 2019-12-17 DIAGNOSIS — R0600 Dyspnea, unspecified: Secondary | ICD-10-CM | POA: Insufficient documentation

## 2019-12-17 DIAGNOSIS — Q230 Congenital stenosis of aortic valve: Secondary | ICD-10-CM | POA: Diagnosis not present

## 2019-12-17 DIAGNOSIS — Z952 Presence of prosthetic heart valve: Secondary | ICD-10-CM

## 2019-12-17 DIAGNOSIS — R0609 Other forms of dyspnea: Secondary | ICD-10-CM | POA: Insufficient documentation

## 2019-12-17 NOTE — Progress Notes (Signed)
Primary Care Provider: Rhetta Mura, PA-C Cardiologist: Dennis Lemma, MD Electrophysiologist: None  Clinic Note: Chief Complaint  Patient presents with  . Follow-up  . Shortness of Breath  . Coronary Artery Disease    History of PCI in setting of STEMI  . 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 male with a PMH of CAD-PCI and SEVERE AORTIC STENOSIS/TAA (s/p BENTALL PROCEDURE WITH [place] [place] MECHANICAL AVR) who presents today for almost 1/2-year follow-up.  Cardiac History:  Inferior STEMI Jan 2016 (seen at PCP (Dennis Bates office for chest pain and dyspnea radiated left arm -> 2 mm inferolateral ST elevations) --> 100% Cx --> PCI with DES;  His diagnostic cath showed significant aortic valve gradient on pullback and SEVERE AORTIC STENOSIS was confirmed by echo --severe bicuspid aortic valve stenosis  Following his 1 year of antiplatelets he was brought back for AVR via Bentall procedure by Dennis Bates. ? Preop cath showed widely patent stent and no other significant disease.  Dennis Bates was last seen on in Dec 2018 -> this is a delayed follow-up as well.  Was for clearance for knee surgery.  His PCP is managing his lipids and anticoagulation.Marland Kitchen  He denied exertional chest tightness or pressure or heart failure symptoms.  PCP had recommended stopping beta-blocker, was only on warfarin and aspirin along with statin.  Especially in light of upcoming surgery, I recommended restarting carvedilol at 3.125 mg twice daily.  Cleared for surgery as low risk.  We actually took back over management of his anticoagulation through our Coumadin clinic up until October 2019, now followed up with PCP  Recent Hospitalizations:   December 09, 2017:  right knee surgery (arthroplasty)  Reviewed  CV studies:    The following studies were reviewed today: (if available, images/films reviewed: From Epic Chart or  Care Everywhere) . None since June 2017 echo  Interval History:   Dennis Bates is here for really delayed follow-up.  He did well after his knee surgery, and he has been doing fairly well over the last year plus, but over the last couple months he has noted that he just gets a little more winded than he used to with somewhat more vigorous activity such as climbing stairs.  He does not have any chest tightness or pressure, just has difficulty catching his breath with but would not be considered a significant amount of exertion.  Otherwise no cardiac symptoms.  CV Review of Symptoms (Summary) Cardiovascular ROS: positive for - dyspnea on exertion negative for - chest pain, edema, irregular heartbeat, orthopnea, palpitations, paroxysmal nocturnal dyspnea, rapid heart rate, shortness of breath or Syncope/near syncope, TIA/amaurosis fugax.,  Claudication  The patient does not have symptoms concerning for COVID-19 infection (fever, chills, cough, or new shortness of breath).  The patient is practicing social distancing & Masking.   Has had both COVID-19 vaccine injections.   REVIEWED OF SYSTEMS   Review of Systems  Constitutional: Positive for malaise/fatigue and weight loss (Intentional with dietary change).  HENT: Negative for congestion and nosebleeds.   Respiratory: Negative for shortness of breath.   Cardiovascular:       Noted in HPI.  Gastrointestinal: Negative for abdominal pain, blood in stool and melena.  Genitourinary: Negative for hematuria.  Musculoskeletal: Negative for joint pain (Knee symptoms are much better.).  Neurological: Negative for dizziness, focal weakness, weakness and headaches.  Psychiatric/Behavioral: Negative  for depression and memory loss. The patient is not nervous/anxious and does not have insomnia.    I have reviewed and (if needed) personally updated the patient's problem list, medications, allergies, past medical and surgical history, social and family  history.   PAST MEDICAL HISTORY   Past Medical History:  Diagnosis Date  . Aortic stenosis due to bicuspid aortic valve    Severe aortic stenosis of bicuspid valve - s/pAVR with Bentall ( 09/2015)  . Bell's palsy    LAST EPISODE 09-2015  . CAD S/P percutaneous coronary angioplasty 09/20/2014   a. inferolat STEMI ->> LHC-Angio: Proximal/ostial LAD 20%, OM2 99% -->> PCI: 3mm x 16 mm Promus Premier DES to the OM2.(~3.5 mm)  . Diabetes mellitus type 2, controlled, with complications (HCC)   . Hyperlipidemia   . Hypertension   . Hypertriglyceridemia   . ST elevation myocardial infarction (STEMI) of inferior wall (HCC) 09/20/2014   99 % occluded Cx-OM2 Promus DES 3.0 mm x 16 mm (3.5 mm)    PAST SURGICAL HISTORY   Past Surgical History:  Procedure Laterality Date  . BENTALL PROCEDURE N/A 10/09/2015   Procedure: BENTALL PROCEDURE (using a St Jude mechanical valve, size 23);  Surgeon: Alleen BorneBryan K Bartle, MD;  [facility];  Service: Open Heart Surgery;  Laterality: N/A;  CIRC ARRESTNEEDS RIGHT RADIAL A-LINE  . KNEE ARTHROSCOPY W/ ACL RECONSTRUCTION Right    90's; "2 ATHROSCOPIC , 2 REBUILDS"  . LEFT HEART CATHETERIZATION WITH CORONARY ANGIOGRAM N/A 09/20/2014   Procedure: LEFT HEART CATHETERIZATION WITH CORONARY ANGIOGRAM;  Surgeon: Dennis Lexavid W Yassmine Tamm, MD;  [facility];  Proximal/ostial LAD 20%, OM2 99% -->> aspiration thrombectomy and DES PCI:   . PERCUTANEOUS CORONARY STENT INTERVENTION (PCI-S)  09/20/2014   Aspiration thrombectomy and DES PCI- OM 2(m-dCx): Promus Premier DES 3.0 mm x 16 mm (3.5 mm)  . RIGHT/LEFT HEART CATH AND CORONARY ANGIOGRAPHY  09/2015   Dr. Nicki Guadalajarahomas Bates: Widely patent OM 2 stent.  Mild disease in RCA and LAD.Relatively normal right heart cath pressures.  Normal cardiac output.  . TEE WITHOUT CARDIOVERSION N/A 10/09/2015   Procedure: TRANSESOPHAGEAL ECHOCARDIOGRAM (TEE);  Surgeon: Alleen BorneBryan K Bartle, MD;  [facility];  Service: Open Heart Surgery;  Laterality: N/A;    . TOTAL KNEE ARTHROPLASTY Right 12/09/2017   Procedure: RIGHT TOTAL KNEE ARTHROPLASTY REMOVAL OF HARDWARE RIGHT KNEE;  Surgeon: Jodi GeraldsGraves, John, MD;  Location: [facility] ORS;  Service: Orthopedics;  Laterality: Right;  . TRANSTHORACIC ECHOCARDIOGRAM  02/2016   Post AVR: mechanical: AVR without obstuction. LVEF improved to 65-70%.  [name]. TRANSTHORACIC ECHOCARDIOGRAM  January 2016, February2016   Probable bicuspid aortic valve: a. mod-sev by 2D ECHO 09/2014. AVA 0.9cm^2; b) 10/2014 Echo:. Severe AS Mn-Pk Gradient 41-71 mmHg, AVA ~0.79 cm2, EF 60-65%    MEDICATIONS/ALLERGIES   Current Meds  Medication Sig  . acetaminophen (TYLENOL) 325 MG tablet Take 2 tablets (650 mg total) by mouth every 6 (six) hours as needed for mild pain.  . ASPIRIN 81 PO Take by mouth.  . carvedilol (COREG) 3.125 MG tablet Take 1 tablet (3.125 mg total) by mouth 2 (two) times daily.  Marland Kitchen. gemfibrozil (LOPID) 600 MG tablet Take 600 mg by mouth in the morning and at bedtime.  Marland Kitchen. JARDIANCE 10 MG TABS tablet Take 10 mg by mouth every morning.  . metFORMIN (GLUCOPHAGE) 1000 MG tablet Take 1,000 mg by mouth 2 (two) times daily with a meal.   . rosuvastatin (CRESTOR) 20 MG tablet Take 20 mg by  mouth at bedtime.  Marland Kitchen warfarin (COUMADIN) 10 MG tablet Take 10 mg by mouth daily.  . [DISCONTINUED] aspirin EC 81 MG EC tablet Take 1 tablet (81 mg total) by mouth daily. (Patient taking differently: Take 81 mg by mouth every morning. )  . [DISCONTINUED] atorvastatin (LIPITOR) 20 MG tablet Take 1 tablet (20 mg total) by mouth daily. (Patient taking differently: Take 40 mg by mouth daily. )  . [DISCONTINUED] warfarin (COUMADIN) 5 MG tablet Take 1.5 to 2 tablets by mouth daily or as directed by coumadin clinic (Patient taking differently: Take 7.5-10 mg by mouth See admin instructions. Take 10 mg by mouth daily on Monday, Wednesday and Friday. Take 7.5 mg by mouth daily on all other days.)    No Known Allergies  SOCIAL HISTORY/FAMILY HISTORY   Reviewed in  Epic:  Pertinent findings:   Still enjoys going on mission trips.  Actually in March 2020, he and his group were stuck in [place] at the beginning of the [employer].  Special dispensation MD made for them to be allowed to fly back from [place].  OBJCTIVE -PE, EKG, labs   Wt Readings from Last 3 Encounters:  12/17/19 184 lb (83.5 kg)  12/09/17 191 lb (86.6 kg)  12/07/17 191 lb (86.6 kg)    Physical Exam: BP 116/72   Pulse 72   Ht 5\' 7"  (1.702 m)   Wt 184 lb (83.5 kg)   SpO2 98%   BMI 28.82 kg/m  Physical Exam  Constitutional: He is oriented to person, place, and time. He appears well-developed and well-nourished. No distress.  Notable weight loss.  Healthy-appearing.  Well-groomed.  HENT:  Head: Normocephalic and atraumatic.  Neck: No hepatojugular reflux and no JVD present. Carotid bruit is not present.  Cardiovascular: Normal rate, regular rhythm and intact distal pulses.  Extrasystoles (Rare) are present. Exam reveals no gallop and no friction rub.  Murmur heard.  Harsh crescendo-decrescendo early systolic murmur is present with a grade of 1/6 at the upper right sternal border radiating to the neck. Normal S1, metallic S2  Pulmonary/Chest: Effort normal and breath sounds normal. No respiratory distress. He has no wheezes. He has no rales.  Abdominal: Soft. Bowel sounds are normal. He exhibits no distension. There is no abdominal tenderness. There is no rebound.  Musculoskeletal:        General: No edema. Normal range of motion.     Cervical back: Normal range of motion and neck supple.  Neurological: He is alert and oriented to person, place, and time.  Psychiatric: Judgment and thought content normal.  Vitals reviewed.    Adult ECG Report  Rate: 72 ;  Rhythm: normal sinus rhythm and ~Incomplete RBBB.  Inferior MI, age undetermined.  Overall low voltage.;  Normal axis, intervals and durations.  Narrative Interpretation: Stable EKG.  Recent Labs: September 26, 2019: TC  131, 8 HDL 30, TG 362.  LDL not on K PN.  A1c 8.2.  Cr 0.75.  K+ 5.0.  Hgb 13.1. Lab Results  Component Value Date   CHOL 110 (L) 02/05/2016   HDL 30 (L) 02/05/2016   LDLCALC 46 02/05/2016   LDLDIRECT 43 09/21/2014   TRIG 172 (H) 02/05/2016   CHOLHDL 3.7 02/05/2016   Lab Results  Component Value Date   CREATININE 0.84 12/10/2017   BUN 16 12/10/2017   NA 138 12/10/2017   K 4.1 12/10/2017   CL 105 12/10/2017   CO2 23 12/10/2017   Lab Results  Component Value Date  TSH 3.113 09/20/2014    ASSESSMENT/PLAN    Problem List Items Addressed This Visit    CAD S/P p DES PCI to the Circumflex-OM 2 (Chronic)    Eventual successful PCI after thrombectomy, restoring TIMI-3 flow.  Widely patent stent in 2017. He is no longer on Thienopyridine antiplatelet agent.  Is only on aspirin. We restart a beta-blocker for cardioprotection at low-dose in 2019.  I doubt exertional dyspnea is related to chronotropic incompetence. Continue statin.  To complete the echo to assess dyspnea, but low threshold to consider stress test evaluation.      Relevant Medications   gemfibrozil (LOPID) 600 MG tablet   rosuvastatin (CRESTOR) 20 MG tablet   warfarin (COUMADIN) 10 MG tablet   ASPIRIN 81 PO   Other Relevant Orders   EKG 12-Lead (Completed)   ECHOCARDIOGRAM COMPLETE   Hx of prosthetic aortic valve replacement -mechanical; along with aortic root (Bental) (Chronic)    With exertional dyspnea, plan to reassess valve with echo.      ST elevation myocardial infarction (STEMI) of inferolateral wall (HCC) (Chronic)    Cath showed essentially single-vessel disease with heavily thrombosed OM 2/d LCx.  Treated with DES PCI.  Has not had angina since, but now noting exertional dyspnea.  Checking 2D echo to assess EF, wall motion and aortic valve      Relevant Medications   gemfibrozil (LOPID) 600 MG tablet   rosuvastatin (CRESTOR) 20 MG tablet   warfarin (COUMADIN) 10 MG tablet   ASPIRIN 81 PO    Aortic stenosis due to bicuspid aortic valve (Chronic)    Surreptitious finding in the setting of STEMI.  Eventually underwent AVR and Bentall procedure.  Last echo was shortly postop.  Plan was to reassess every few years.  Now with having exertional dyspnea.  I think it is reasonable to recheck 2D echo.      Relevant Medications   gemfibrozil (LOPID) 600 MG tablet   rosuvastatin (CRESTOR) 20 MG tablet   warfarin (COUMADIN) 10 MG tablet   ASPIRIN 81 PO   Other Relevant Orders   EKG 12-Lead (Completed)   ECHOCARDIOGRAM COMPLETE   Dyslipidemia, goal LDL below 70 (Chronic)    Is on fenofibrate plus rosuvastatin.  Unfortunately I do not have a full lipids based on the K PN this year, but based on total cholesterol of 131, I would imagine LDL is pretty well controlled.  Difficult to assess because of triglycerides.  We will try to get complete labs from PCP.Marland Kitchen      Relevant Medications   gemfibrozil (LOPID) 600 MG tablet   rosuvastatin (CRESTOR) 20 MG tablet   warfarin (COUMADIN) 10 MG tablet   ASPIRIN 81 PO   DOE (dyspnea on exertion) - Primary    I think some of this can be related to deconditioning since he has not really been all that active during the COVID-19 shutdown.  Also, he has been less active due to the winter weather.  Now that weather is improving, I would like to see how he does getting back into an exercise regimen.  We are checking a 2D echocardiogram to assess EF, wall motion and the prosthetic valve.  Reassess in 3 to 59months, if symptoms persist with attempted increased exercise, would probably plan stress test.      Relevant Orders   EKG 12-Lead (Completed)   ECHOCARDIOGRAM COMPLETE       COVID-19 Education: The signs and symptoms of COVID-19 were discussed with the patient and how  to seek care for testing (follow up with PCP or arrange E-visit).   The importance of social distancing was discussed today.  I spent a total of 25 minutes with the patient. >  50%  of the time was spent in direct patient consultation.  Additional time spent with chart review  / charting (studies, outside notes, etc): 10 Total Time: 35 min   Current medicines are reviewed at length with the patient today.  (+/- concerns) n/a  Notice: This dictation was prepared with Dragon dictation along with smaller phrase technology. Any transcriptional errors that result from this process are unintentional and may not be corrected upon review.  Patient Instructions / Medication Changes & Studies & Tests Ordered   Patient Instructions  Medication Instructions:  No changes  *If you need a refill on your cardiac medications before your next appointment, please call your pharmacy*   Lab Work: Not needed .   Testing/Procedures: Will be schedule at [address] Your physician has requested that you have an echocardiogram. Echocardiography is a painless test that uses sound waves to create images of your heart. It provides your doctor with information about the size and shape of your heart and how well your heart's chambers and valves are working. This procedure takes approximately one hour. There are no restrictions for this procedure.     Follow-Up: At [facility], you and your health needs are our priority.  As part of our continuing mission to provide you with exceptional heart care, we have created designated Provider Care Teams.  These Care Teams include your primary Cardiologist (physician) and Advanced Practice Providers (APPs -  Physician Assistants and Nurse Practitioners) who all work together to provide you with the care you need, when you need it.     Your next appointment:   3 month(s) July or Aug 2021  The format for your next appointment:   In Person  Provider:   Bryan Lemma, MD   Other Instructions n/a    Studies Ordered:   Orders Placed This Encounter  Procedures  . EKG 12-Lead  . ECHOCARDIOGRAM COMPLETE     Dennis Bates, M.D., M.S. Interventional Cardiologist   Pager # [phone] Phone # [phone] [address] [place], [place] [address]   Thank you for choosing Heart[facility] at [facility]!!

## 2019-12-17 NOTE — Patient Instructions (Signed)
Medication Instructions:  No changes  *If you need a refill on your cardiac medications before your next appointment, please call your pharmacy*   Lab Work: Not needed .   Testing/Procedures: Will be schedule at [address] Your physician has requested that you have an echocardiogram. Echocardiography is a painless test that uses sound waves to create images of your heart. It provides your doctor with information about the size and shape of your heart and how well your heart's chambers and valves are working. This procedure takes approximately one hour. There are no restrictions for this procedure.     Follow-Up: At [facility], you and your health needs are our priority.  As part of our continuing mission to provide you with exceptional heart care, we have created designated Provider Care Teams.  These Care Teams include your primary Cardiologist (physician) and Advanced Practice Providers (APPs -  Physician Assistants and Nurse Practitioners) who all work together to provide you with the care you need, when you need it.     Your next appointment:   3 month(s) July or Aug 2021  The format for your next appointment:   In Person  Provider:   Bryan Lemma, MD   Other Instructions n/a

## 2019-12-18 ENCOUNTER — Encounter: Payer: Self-pay | Admitting: Cardiology

## 2019-12-18 ENCOUNTER — Telehealth: Payer: Self-pay | Admitting: *Deleted

## 2019-12-18 NOTE — Assessment & Plan Note (Signed)
I think some of this can be related to deconditioning since he has not really been all that active during the COVID-19 shutdown.  Also, he has been less active due to the winter weather.  Now that weather is improving, I would like to see how he does getting back into an exercise regimen.  We are checking a 2D echocardiogram to assess EF, wall motion and the prosthetic valve.  Reassess in 3 to 24months, if symptoms persist with attempted increased exercise, would probably plan stress test.

## 2019-12-18 NOTE — Assessment & Plan Note (Signed)
With exertional dyspnea, plan to reassess valve with echo.

## 2019-12-18 NOTE — Telephone Encounter (Signed)
Left message for patient to call and schedule Echo ordered by Dr. Harding 

## 2019-12-18 NOTE — Assessment & Plan Note (Signed)
Eventual successful PCI after thrombectomy, restoring TIMI-3 flow.  Widely patent stent in 2017. He is no longer on Thienopyridine antiplatelet agent.  Is only on aspirin. We restart a beta-blocker for cardioprotection at low-dose in 2019.  I doubt exertional dyspnea is related to chronotropic incompetence. Continue statin.  To complete the echo to assess dyspnea, but low threshold to consider stress test evaluation.

## 2019-12-18 NOTE — Assessment & Plan Note (Signed)
Cath showed essentially single-vessel disease with heavily thrombosed OM 2/d LCx.  Treated with DES PCI.  Has not had angina since, but now noting exertional dyspnea.  Checking 2D echo to assess EF, wall motion and aortic valve

## 2019-12-18 NOTE — Assessment & Plan Note (Signed)
Is on fenofibrate plus rosuvastatin.  Unfortunately I do not have a full lipids based on the K PN this year, but based on total cholesterol of 131, I would imagine LDL is pretty well controlled.  Difficult to assess because of triglycerides.  We will try to get complete labs from PCP.Marland Kitchen

## 2019-12-18 NOTE — Assessment & Plan Note (Signed)
Surreptitious finding in the setting of STEMI.  Eventually underwent AVR and Bentall procedure.  Last echo was shortly postop.  Plan was to reassess every few years.  Now with having exertional dyspnea.  I think it is reasonable to recheck 2D echo.

## 2019-12-25 ENCOUNTER — Ambulatory Visit (HOSPITAL_COMMUNITY): Payer: BC Managed Care – PPO | Attending: Internal Medicine

## 2019-12-25 ENCOUNTER — Other Ambulatory Visit: Payer: Self-pay

## 2019-12-25 DIAGNOSIS — Z9861 Coronary angioplasty status: Secondary | ICD-10-CM | POA: Diagnosis present

## 2019-12-25 DIAGNOSIS — R0600 Dyspnea, unspecified: Secondary | ICD-10-CM | POA: Insufficient documentation

## 2019-12-25 DIAGNOSIS — I2510 Atherosclerotic heart disease of native coronary artery without angina pectoris: Secondary | ICD-10-CM | POA: Insufficient documentation

## 2019-12-25 DIAGNOSIS — Q230 Congenital stenosis of aortic valve: Secondary | ICD-10-CM | POA: Diagnosis not present

## 2019-12-25 DIAGNOSIS — Q231 Congenital insufficiency of aortic valve: Secondary | ICD-10-CM | POA: Diagnosis not present

## 2019-12-25 DIAGNOSIS — R0609 Other forms of dyspnea: Secondary | ICD-10-CM

## 2020-01-18 ENCOUNTER — Inpatient Hospital Stay (HOSPITAL_COMMUNITY)
Admission: EM | Admit: 2020-01-18 | Discharge: 2020-01-25 | DRG: 378 | Disposition: A | Discharge status: Home / Self Care | Payer: BC Managed Care – PPO | Attending: Internal Medicine | Admitting: Internal Medicine

## 2020-01-18 ENCOUNTER — Other Ambulatory Visit: Payer: Self-pay

## 2020-01-18 ENCOUNTER — Encounter (HOSPITAL_COMMUNITY): Payer: Self-pay

## 2020-01-18 DIAGNOSIS — Z7982 Long term (current) use of aspirin: Secondary | ICD-10-CM | POA: Diagnosis not present

## 2020-01-18 DIAGNOSIS — E782 Mixed hyperlipidemia: Secondary | ICD-10-CM | POA: Diagnosis present

## 2020-01-18 DIAGNOSIS — K648 Other hemorrhoids: Secondary | ICD-10-CM | POA: Diagnosis present

## 2020-01-18 DIAGNOSIS — R17 Unspecified jaundice: Secondary | ICD-10-CM | POA: Diagnosis present

## 2020-01-18 DIAGNOSIS — K5730 Diverticulosis of large intestine without perforation or abscess without bleeding: Secondary | ICD-10-CM | POA: Diagnosis present

## 2020-01-18 DIAGNOSIS — D649 Anemia, unspecified: Secondary | ICD-10-CM | POA: Diagnosis not present

## 2020-01-18 DIAGNOSIS — Z842 Family history of other diseases of the genitourinary system: Secondary | ICD-10-CM | POA: Diagnosis not present

## 2020-01-18 DIAGNOSIS — E781 Pure hyperglyceridemia: Secondary | ICD-10-CM | POA: Diagnosis present

## 2020-01-18 DIAGNOSIS — Z7901 Long term (current) use of anticoagulants: Secondary | ICD-10-CM | POA: Diagnosis not present

## 2020-01-18 DIAGNOSIS — D500 Iron deficiency anemia secondary to blood loss (chronic): Secondary | ICD-10-CM | POA: Diagnosis not present

## 2020-01-18 DIAGNOSIS — D509 Iron deficiency anemia, unspecified: Secondary | ICD-10-CM | POA: Diagnosis not present

## 2020-01-18 DIAGNOSIS — I2510 Atherosclerotic heart disease of native coronary artery without angina pectoris: Secondary | ICD-10-CM | POA: Diagnosis present

## 2020-01-18 DIAGNOSIS — E119 Type 2 diabetes mellitus without complications: Secondary | ICD-10-CM | POA: Diagnosis present

## 2020-01-18 DIAGNOSIS — I10 Essential (primary) hypertension: Secondary | ICD-10-CM | POA: Diagnosis present

## 2020-01-18 DIAGNOSIS — K922 Gastrointestinal hemorrhage, unspecified: Secondary | ICD-10-CM

## 2020-01-18 DIAGNOSIS — Z86010 Personal history of colonic polyps: Secondary | ICD-10-CM | POA: Diagnosis not present

## 2020-01-18 DIAGNOSIS — Z20822 Contact with and (suspected) exposure to covid-19: Secondary | ICD-10-CM | POA: Diagnosis present

## 2020-01-18 DIAGNOSIS — Z79899 Other long term (current) drug therapy: Secondary | ICD-10-CM | POA: Diagnosis not present

## 2020-01-18 DIAGNOSIS — K921 Melena: Secondary | ICD-10-CM

## 2020-01-18 DIAGNOSIS — D62 Acute posthemorrhagic anemia: Secondary | ICD-10-CM | POA: Diagnosis present

## 2020-01-18 DIAGNOSIS — G453 Amaurosis fugax: Secondary | ICD-10-CM | POA: Diagnosis not present

## 2020-01-18 DIAGNOSIS — Z952 Presence of prosthetic heart valve: Secondary | ICD-10-CM

## 2020-01-18 DIAGNOSIS — K31811 Angiodysplasia of stomach and duodenum with bleeding: Secondary | ICD-10-CM | POA: Diagnosis present

## 2020-01-18 DIAGNOSIS — Z955 Presence of coronary angioplasty implant and graft: Secondary | ICD-10-CM | POA: Diagnosis not present

## 2020-01-18 DIAGNOSIS — K5711 Diverticulosis of small intestine without perforation or abscess with bleeding: Secondary | ICD-10-CM | POA: Diagnosis present

## 2020-01-18 DIAGNOSIS — Z8249 Family history of ischemic heart disease and other diseases of the circulatory system: Secondary | ICD-10-CM | POA: Diagnosis not present

## 2020-01-18 DIAGNOSIS — Z7984 Long term (current) use of oral hypoglycemic drugs: Secondary | ICD-10-CM | POA: Diagnosis not present

## 2020-01-18 DIAGNOSIS — I252 Old myocardial infarction: Secondary | ICD-10-CM

## 2020-01-18 LAB — COMPREHENSIVE METABOLIC PANEL
ALT: 14 U/L (ref 0–44)
AST: 18 U/L (ref 15–41)
Albumin: 3.7 g/dL (ref 3.5–5.0)
Alkaline Phosphatase: 29 U/L — ABNORMAL LOW (ref 38–126)
Anion gap: 11 (ref 5–15)
BUN: 20 mg/dL (ref 6–20)
CO2: 20 mmol/L — ABNORMAL LOW (ref 22–32)
Calcium: 9.1 mg/dL (ref 8.9–10.3)
Chloride: 99 mmol/L (ref 98–111)
Creatinine, Ser: 0.97 mg/dL (ref 0.61–1.24)
GFR calc Af Amer: >60 mL/min (ref >60)
GFR calc non Af Amer: >60 mL/min (ref >60)
Glucose, Bld: 199 mg/dL — ABNORMAL HIGH (ref 70–99)
Potassium: 4.2 mmol/L (ref 3.5–5.1)
Sodium: 130 mmol/L — ABNORMAL LOW (ref 135–145)
Total Bilirubin: 1 mg/dL (ref 0.3–1.2)
Total Protein: 6.3 g/dL — ABNORMAL LOW (ref 6.5–8.1)

## 2020-01-18 LAB — PROTIME-INR
INR: 2.8 — ABNORMAL HIGH (ref 0.8–1.2)
Prothrombin Time: 28.9 seconds — ABNORMAL HIGH (ref 11.4–15.2)

## 2020-01-18 LAB — CBC
HCT: 17.4 % — ABNORMAL LOW (ref 39.0–52.0)
Hemoglobin: 5 g/dL — CL (ref 13.0–17.0)
MCH: 23.9 pg — ABNORMAL LOW (ref 26.0–34.0)
MCHC: 28.7 g/dL — ABNORMAL LOW (ref 30.0–36.0)
MCV: 83.3 fL (ref 80.0–100.0)
Platelets: 375 10*3/uL (ref 150–400)
RBC: 2.09 MIL/uL — ABNORMAL LOW (ref 4.22–5.81)
RDW: 15.2 % (ref 11.5–15.5)
WBC: 7.7 10*3/uL (ref 4.0–10.5)
nRBC: 0.4 % — ABNORMAL HIGH (ref 0.0–0.2)

## 2020-01-18 LAB — TROPONIN I (HIGH SENSITIVITY): Troponin I (High Sensitivity): 12 ng/L (ref <18)

## 2020-01-18 LAB — RESPIRATORY PANEL BY RT PCR (FLU A&B, COVID)
Influenza A by PCR: NEGATIVE
Influenza B by PCR: NEGATIVE
SARS Coronavirus 2 by RT PCR: NEGATIVE

## 2020-01-18 LAB — CBG MONITORING, ED: Glucose-Capillary: 142 mg/dL — ABNORMAL HIGH (ref 70–99)

## 2020-01-18 LAB — PREPARE RBC (CROSSMATCH)

## 2020-01-18 LAB — POC OCCULT BLOOD, ED: Fecal Occult Bld: POSITIVE — AB

## 2020-01-18 MED ORDER — SODIUM CHLORIDE 0.9 % IV SOLN
10.0000 mL/h | Freq: Once | INTRAVENOUS | Status: AC
  Administered 2020-01-18: 10 mL/h via INTRAVENOUS

## 2020-01-18 MED ORDER — SODIUM CHLORIDE 0.9% FLUSH
3.0000 mL | Freq: Two times a day (BID) | INTRAVENOUS | Status: DC
  Administered 2020-01-19 – 2020-01-25 (×12): 3 mL via INTRAVENOUS

## 2020-01-18 MED ORDER — SODIUM CHLORIDE 0.9 % IV SOLN
80.0000 mg | Freq: Once | INTRAVENOUS | Status: AC
  Administered 2020-01-18: 80 mg via INTRAVENOUS
  Filled 2020-01-18: qty 80

## 2020-01-18 MED ORDER — ACETAMINOPHEN 650 MG RE SUPP: 650.0000 mg | Freq: Four times a day (QID) | RECTAL | Status: DC | PRN

## 2020-01-18 MED ORDER — ACETAMINOPHEN 325 MG PO TABS: 650.0000 mg | ORAL_TABLET | Freq: Four times a day (QID) | ORAL | Status: DC | PRN

## 2020-01-18 MED ORDER — SODIUM CHLORIDE 0.9 % IV SOLN
8.0000 mg/h | INTRAVENOUS | Status: DC
  Administered 2020-01-18 – 2020-01-19 (×2): 8 mg/h via INTRAVENOUS
  Filled 2020-01-18 (×3): qty 80

## 2020-01-18 MED ORDER — INSULIN ASPART 100 UNIT/ML ~~LOC~~ SOLN
0.0000 [IU] | SUBCUTANEOUS | Status: DC
  Administered 2020-01-18 – 2020-01-19 (×2): 1 [IU] via SUBCUTANEOUS
  Administered 2020-01-19: 2 [IU] via SUBCUTANEOUS
  Administered 2020-01-19 – 2020-01-22 (×8): 1 [IU] via SUBCUTANEOUS
  Administered 2020-01-22 – 2020-01-23 (×3): 2 [IU] via SUBCUTANEOUS
  Administered 2020-01-23: 5 [IU] via SUBCUTANEOUS
  Administered 2020-01-23: 2 [IU] via SUBCUTANEOUS
  Administered 2020-01-23: 1 [IU] via SUBCUTANEOUS
  Administered 2020-01-24: 3 [IU] via SUBCUTANEOUS

## 2020-01-18 MED ORDER — ONDANSETRON HCL 4 MG PO TABS: 4.0000 mg | ORAL_TABLET | Freq: Four times a day (QID) | ORAL | Status: DC | PRN

## 2020-01-18 MED ORDER — ONDANSETRON HCL 4 MG/2ML IJ SOLN: 4.0000 mg | Freq: Four times a day (QID) | INTRAMUSCULAR | Status: DC | PRN

## 2020-01-18 NOTE — ED Notes (Signed)
Dr Jeraldine Loots aware Hgb 5

## 2020-01-18 NOTE — ED Provider Notes (Signed)
[facility] EMERGENCY DEPARTMENT Provider Note   CSN: [id number] Arrival date & time: 01/18/20  1438     History Chief Complaint  Patient presents with  . Fatigue  . Shortness of Breath    Dennis Bates is a 59 y.o. male.  Patient with history of coronary artery disease, aortic valve/aortic root replacement due to bicuspid aortic valve in 2017 (Dr. Cyndia Bent), chronic warfarin use diabetes, high cholesterol, hypertension --presents to the emergency department today with complaint of progressively worsening shortness of breath, lightheadedness ongoing over the past 2 weeks.  Patient denies any chest pain or abdominal pain with the symptoms.  He states that he saw his cardiologist and had an echo done on 12/25/2019 which looked okay.  He followed up with his primary care doctor who sent him for ' a breathing test'.  He also had blood counts checked and was started on iron.  Patient reports having intermittently black stools over the past 2 weeks.  He denies NSAID or aspirin use.  He denies frequent use of alcohol.  He checks his INR at home and last check was in the 2's. No history of GI bleeding.  Denies hemoptysis, hematemesis, hematuria.  He has not noticed any worsening easy bruising or bleeding.        Past Medical History:  Diagnosis Date  . Aortic stenosis due to bicuspid aortic valve    Severe aortic stenosis of bicuspid valve - s/pAVR with Bentall ( 09/2015)  . Bell's palsy    LAST EPISODE 09-2015  . CAD S/P percutaneous coronary angioplasty 09/20/2014   a. inferolat STEMI ->> LHC-Angio: Proximal/ostial LAD 20%, OM2 99% -->> PCI: 24mm x 16 mm Promus Premier DES to the OM2.(~3.5 mm)  . Diabetes mellitus type 2, controlled, with complications ([place])   . Hyperlipidemia   . Hypertension   . Hypertriglyceridemia   . ST elevation myocardial infarction (STEMI) of inferior wall (HCC) 09/20/2014   99 % occluded Cx-OM2 Promus DES 3.0 mm x 16 mm (3.5 mm)    Patient  Active Problem List   Diagnosis Date Noted  . DOE (dyspnea on exertion) 12/17/2019  . Mechanical heart valve present 12/12/2017  . Primary osteoarthritis of right knee 12/09/2017  . Retained orthopedic hardware 12/09/2017  . Primary osteoarthritis of left knee 12/09/2017  . Preoperative clearance 09/02/2017  . Encounter for medication adjustment 07/14/2016  . Long term (current) use of anticoagulants [Z79.01] 10/27/2015  . Hx of prosthetic aortic valve replacement -mechanical; along with aortic root ([place]) 10/27/2015  . Type 2 diabetes mellitus with hyperglycemia ([place]) 10/21/2015  . Aortic root dilatation ([place])   . Hypertriglyceridemia   . CAD S/P p DES PCI to the Circumflex-OM 2   . Diabetes mellitus type II, non insulin dependent ([place]) 09/21/2014  . Aortic stenosis due to bicuspid aortic valve 09/21/2014  . Dyslipidemia, goal LDL below 70 09/21/2014  . ST elevation myocardial infarction (STEMI) of inferolateral wall ([place]) 09/20/2014    Past Surgical History:  Procedure Laterality Date  . BENTALL PROCEDURE N/A 10/09/2015   Procedure: BENTALL PROCEDURE (using a St Jude mechanical valve, size 23);  Surgeon: Gaye Pollack, MD;  Location: [facility];  Service: Open Heart Surgery;  Laterality: N/A;  CIRC ARRESTNEEDS RIGHT RADIAL A-LINE  . KNEE ARTHROSCOPY W/ ACL RECONSTRUCTION Right    90's; "2 ATHROSCOPIC , 2 REBUILDS"  . LEFT HEART CATHETERIZATION WITH CORONARY ANGIOGRAM N/A 09/20/2014   Procedure: LEFT HEART CATHETERIZATION WITH CORONARY ANGIOGRAM;  Surgeon: Leonie Green  [name], MD;  Location: [facility] CATH LAB;  Proximal/ostial LAD 20%, OM2 99% -->> aspiration thrombectomy and DES PCI:   . PERCUTANEOUS CORONARY STENT INTERVENTION (PCI-S)  09/20/2014   Aspiration thrombectomy and DES PCI- OM 2(m-dCx): Promus Premier DES 3.0 mm x 16 mm (3.5 mm)  . RIGHT/LEFT HEART CATH AND CORONARY ANGIOGRAPHY  09/2015   Dr. Nicki Guadalajara: Widely patent OM 2 stent.  Mild disease in RCA and LAD.Relatively normal right  heart cath pressures.  Normal cardiac output.  . TEE WITHOUT CARDIOVERSION N/A 10/09/2015   Procedure: TRANSESOPHAGEAL ECHOCARDIOGRAM (TEE);  Surgeon: Alleen Borne, MD;  Location: [facility];  Service: Open Heart Surgery;  Laterality: N/A;  . TOTAL KNEE ARTHROPLASTY Right 12/09/2017   Procedure: RIGHT TOTAL KNEE ARTHROPLASTY REMOVAL OF HARDWARE RIGHT KNEE;  Surgeon: Jodi Geralds, MD;  Location: [facility] ORS;  Service: Orthopedics;  Laterality: Right;  . TRANSTHORACIC ECHOCARDIOGRAM  02/2016   Post AVR: mechanical: AVR without obstuction. LVEF improved to 65-70%.  [name] ECHOCARDIOGRAM  January 2016, February2016   Probable bicuspid aortic valve: a. mod-sev by 2D ECHO 09/2014. AVA 0.9cm^2; b) 10/2014 Echo:. Severe AS Mn-Pk Gradient 41-71 mmHg, AVA ~0.79 cm2, EF 60-65%       Family History  Problem Relation Age of Onset  . Heart disease Mother   . CAD Neg Hx        per wife    Social History   Tobacco Use  . Smoking status: Never Smoker  . Smokeless tobacco: Former Neurosurgeon    Types: Chew  Substance Use Topics  . Alcohol use: Yes    Alcohol/week: 0.0 standard drinks    Comment: socially  . Drug use: No    Home Medications Prior to Admission medications   Medication Sig Start Date End Date Taking? Authorizing Provider  acetaminophen (TYLENOL) 325 MG tablet Take 2 tablets (650 mg total) by mouth every 6 (six) hours as needed for mild pain. 10/17/15   Ardelle Balls, PA-C  ASPIRIN 81 PO Take by mouth.    [provider]  carvedilol (COREG) 3.125 MG tablet Take 1 tablet (3.125 mg total) by mouth 2 (two) times daily. 09/01/17 12/17/19  Marykay Lex, MD  gemfibrozil (LOPID) 600 MG tablet Take 600 mg by mouth in the morning and at bedtime. 11/25/19   [provider]  JARDIANCE 10 MG TABS tablet Take 10 mg by mouth every morning. 11/25/19   [provider]  metFORMIN (GLUCOPHAGE) 1000 MG tablet Take 1,000 mg by mouth 2 (two) times daily with a meal.  03/27/14    [provider]  rosuvastatin (CRESTOR) 20 MG tablet Take 20 mg by mouth at bedtime. 11/29/19   [provider]  warfarin (COUMADIN) 10 MG tablet Take 10 mg by mouth daily. 12/11/19   [provider]    Allergies    Patient has no known allergies.  Review of Systems   Review of Systems  Constitutional: Negative for fever.  HENT: Negative for rhinorrhea and sore throat.   Eyes: Negative for redness.  Respiratory: Positive for shortness of breath. Negative for cough and chest tightness.   Cardiovascular: Negative for chest pain and leg swelling.  Gastrointestinal: Positive for blood in stool (Black stool). Negative for abdominal pain, diarrhea, nausea and vomiting.  Genitourinary: Negative for dysuria.  Musculoskeletal: Negative for myalgias.  Skin: Negative for rash.  Neurological: Positive for light-headedness. Negative for headaches.    Physical Exam Updated Vital Signs BP 126/81   Pulse 92  Temp 97.7 F (36.5 C) (Oral)   Resp 18   Ht 5\' 8"  (1.727 m)   Wt 79.8 kg   SpO2 100%   BMI 26.76 kg/m   Physical Exam Vitals and nursing note reviewed.  Constitutional:      Appearance: He is well-developed.  HENT:     Head: Normocephalic and atraumatic.  Eyes:     General:        Right eye: No discharge.        Left eye: No discharge.     Comments: Pale conjunctiva  Cardiovascular:     Rate and Rhythm: Normal rate and regular rhythm.     Heart sounds: Murmur present.  Pulmonary:     Effort: Pulmonary effort is normal.     Breath sounds: Normal breath sounds. No decreased breath sounds, wheezing, rhonchi or rales.  Abdominal:     Palpations: Abdomen is soft.     Tenderness: There is no abdominal tenderness. There is no guarding or rebound.  Genitourinary:    Rectum: Guaiac result positive. No tenderness, external hemorrhoid or internal hemorrhoid.     Comments: Small amount of melena noted on finger. Musculoskeletal:     Cervical back: Normal  range of motion and neck supple.     Right lower leg: No tenderness. No edema.     Left lower leg: No tenderness. No edema.  Skin:    General: Skin is warm and dry.     Coloration: Skin is pale.  Neurological:     Mental Status: He is alert.     ED Results / Procedures / Treatments   Labs (all labs ordered are listed, but only abnormal results are displayed) Labs Reviewed  COMPREHENSIVE METABOLIC PANEL - Abnormal; Notable for the following components:      Result Value   Sodium 130 (*)    CO2 20 (*)    Glucose, Bld 199 (*)    Total Protein 6.3 (*)    Alkaline Phosphatase 29 (*)    All other components within normal limits  CBC - Abnormal; Notable for the following components:   RBC 2.09 (*)    Hemoglobin 5.0 (*)    HCT 17.4 (*)    MCH 23.9 (*)    MCHC 28.7 (*)    nRBC 0.4 (*)    All other components within normal limits  POC OCCULT BLOOD, ED - Abnormal; Notable for the following components:   Fecal Occult Bld POSITIVE (*)    All other components within normal limits  RESPIRATORY PANEL BY RT PCR (FLU A&B, COVID)  PROTIME-INR  TYPE AND SCREEN  PREPARE RBC (CROSSMATCH)    ED ECG REPORT   Date: 01/18/2020  Rate: 92  Rhythm: normal sinus rhythm  QRS Axis: normal  Intervals: normal  ST/T Wave abnormalities: normal  Conduction Disutrbances:none  Narrative Interpretation: q-waves  Old EKG Reviewed: unchanged  I have personally reviewed the EKG tracing and agree with the computerized printout as noted.  Radiology No results found.  Procedures Procedures (including critical care time)  Medications Ordered in ED Medications  pantoprazole (PROTONIX) 80 mg in sodium chloride 0.9 % 100 mL IVPB (80 mg Intravenous New Bag/Given 01/18/20 1916)  pantoprazole (PROTONIX) 80 mg in sodium chloride 0.9 % 100 mL (0.8 mg/mL) infusion (has no administration in time range)  0.9 %  sodium chloride infusion (10 mL/hr Intravenous New Bag/Given 01/18/20 1934)    ED Course  I have  reviewed the triage vital signs and the  nursing notes.  Pertinent labs & imaging results that were available during my care of the patient were reviewed by me and considered in my medical decision making (see chart for details).  Patient seen and examined. Work-up initiated. Medications ordered.  DRE performed with NP chaperone.  I discussed blood transfusion risks and benefits with patient and his wife at bedside.  He agrees to proceed.  2 units ordered.  Will need GI consultation if Hemoccult is positive.  INR ordered.  At this point the patient is hemodynamically stable does not require emergent reversal.  Vital signs reviewed and are as follows: BP 126/81   Pulse 92   Temp 97.7 F (36.5 C) (Oral)   Resp 18   Ht 5\' 8"  (1.727 m)   Wt 79.8 kg   SpO2 100%   BMI 26.76 kg/m   6:47 PM patient is receiving blood.  She continues to do well.  No signs of transfusion reaction.  I spoke with Dr. of [facility] gastroenterology.  They plan on consultation in the morning.  Continuing to await INR.  Unfortunately this was not obtained before blood administration.  Again, do not suspect that the patient has brisk bleeding based on exam.  He is hemodynamically stable.  Will call for admission.  7:39 PM Spoke with IMTS who will see for admission.   CRITICAL CARE Performed by: [facility] PA-C Total critical care time: 45 minutes Critical care time was exclusive of separately billable procedures and treating other patients. Critical care was necessary to treat or prevent imminent or life-threatening deterioration. Critical care was time spent personally by me on the following activities: development of treatment plan with patient and/or surrogate as well as nursing, discussions with consultants, evaluation of patient's response to treatment, examination of patient, obtaining history from patient or surrogate, ordering and performing treatments and interventions, ordering and review of  laboratory studies, ordering and review of radiographic studies, pulse oximetry and re-evaluation of patient's condition.     MDM Rules/Calculators/A&P                      Patient with GI bleeding on warfarin.  Suspect upper GI bleeding given patient's history of melena.  He is receiving packed red blood cells due to symptomatic anemia.   Final Clinical Impression(s) / ED Diagnoses Final diagnoses:  Symptomatic anemia  Gastrointestinal hemorrhage, unspecified gastrointestinal hemorrhage type  Melena      Rx / DC Orders ED Discharge Orders    None       Renne Crigler, PA-C 01/18/20 1939    03/19/20, MD 01/24/20 [phone]

## 2020-01-18 NOTE — Hospital Course (Addendum)
Admitted 01/18/2020  Allergies: Patient has no known allergies. Pertinent Hx: CAD s/p stent, aortic valve/aortic root replacement due to bicuspid aortic valve in 2017 (Dr. Laneta Simmers), chronic warfarin use diabetes, high cholesterol, hypertension  60 y.o. male p/w **SOB, lightheadedness x 2 w  *Symptomatic anemia: Hb 5. Normocytic. Likely secondary to upper GIB. has melena and FOBT is positive. Was on warfarin at home.  INR 2.8. s/p 2 units RBC transfusion, npo, on protonix in ED, Holding warfarin and ASA, GI will see pt in AM.  *Hyponatremia: Na 130. Corrected with glucose.   Home Meds: Gemfibrozil, Jardiance, Metformin, Crestor, Coumadin Consults: GI Meds: Protonix, SSI VTE ppx: SCDs IVF: ** Diet: N.p.o.

## 2020-01-18 NOTE — ED Notes (Signed)
Report attempted x1. RN Inetta Fermo to call back. Will attempt report again in if no return call

## 2020-01-18 NOTE — H&P (Addendum)
Date: 01/19/2020               Patient Name:  Dennis Bates MRN: [id number]  DOB: 05/11/1961 Age / Sex: 59 y.o., male   PCP: Bertha Stakes         Medical Service: Internal Medicine Teaching Service         Attending Physician: Dr. Reymundo Poll, MD    First Contact: Dr. Sande Brothers Pager: [phone]  Second Contact: Dr. Chesley Mires Pager: [phone]       After Hours (After 5p/  First Contact Pager: [phone]  weekends / holidays): Second Contact Pager: [phone]   Chief Complaint:   History of Present Illness: Dennis Bates is a 59 year old person with PMhx of HTN,HLD, Bells palsy( left side of face) CAD, MI s/p DES to Circumflex- OM2, bicuspid valve s/p prosthetic aortic valve replacement who presents for evaluation of SOB. Patient was told by a friend he looked pale a month ago , but he did not pay it much attention. Over the last month he has had progressive SOB. He has been even more tired in the last 2 weeks. He could not walk his dog and struggled going up the stairs. He mentions  he had to lean on the bulding every half block when he walked.  He saw his PCP in the last few weeks in [facility] and some blood work was done. He was told his iron was low and was started on iron supplement. He mentions he was given a home FOB, but had not turned it in. He also mentions his PCP said he might have to see a hematologist, but denies any knowledge of why a hematologist. He does not know what his Hgb was at the time. In the last week he felt even worse and recently called his PCP who recommended him to go to ED. His stool has been intermediate black for a few weeks. Recently had some flank pain when walking. Endorses some un significant weight loss. He says to decreased appetitie in the last week. In addition he had nausea and vomitting after eating yogurt on the way to ED. Reports he feels much better during exam. No prior Hx of GIB. He does not recall doing EGD but colonoscopy 5 years  ago showed some polyps. He was recommended to repeat colonoscopy in 10 years.Denies any hemoptysis, bright red blood per rectum, uremia, dysuria,  abdominal pain, or chest pain.  In ED. His VS are stable: HR 92, BP 126/81, respiratory rate 18, O2 sat 100% on room air.  Hemoglobin 5, MCV 83.3, FOBT positive.  INR 2.8 (drawn after starting transfusion), Na 130, blood glucose 199, creatinine 0.97.  Troponin negative at 12  Meds:  Current Meds  Medication Sig  . acetaminophen (TYLENOL) 325 MG tablet Take 2 tablets (650 mg total) by mouth every 6 (six) hours as needed for mild pain.  . ASPIRIN 81 PO Take 1 tablet by mouth daily.   . carvedilol (COREG) 3.125 MG tablet Take 1 tablet (3.125 mg total) by mouth 2 (two) times daily.  Marland Kitchen gemfibrozil (LOPID) 600 MG tablet Take 600 mg by mouth in the morning and at bedtime.  . metFORMIN (GLUCOPHAGE) 1000 MG tablet Take 1,000 mg by mouth 2 (two) times daily with a meal.   . rosuvastatin (CRESTOR) 20 MG tablet Take 20 mg by mouth at bedtime.  Marland Kitchen warfarin (COUMADIN) 10 MG tablet Take 10 mg by mouth daily.     Allergies:  Allergies as of 01/18/2020  . (No Known Allergies)   Past Medical History:  Diagnosis Date  . Aortic stenosis due to bicuspid aortic valve    Severe aortic stenosis of bicuspid valve - s/pAVR with Bentall ( 09/2015)  . Bell's palsy    LAST EPISODE 09-2015  . CAD S/P percutaneous coronary angioplasty 09/20/2014   a. inferolat STEMI ->> LHC-Angio: Proximal/ostial LAD 20%, OM2 99% -->> PCI: 65mm x 16 mm Promus Premier DES to the OM2.(~3.5 mm)  . Diabetes mellitus type 2, controlled, with complications ([place])   . Hyperlipidemia   . Hypertension   . Hypertriglyceridemia   . ST elevation myocardial infarction (STEMI) of inferior wall (HCC) 09/20/2014   99 % occluded Cx-OM2 Promus DES 3.0 mm x 16 mm (3.5 mm)    Family History:  Mother had poor overall health and developed ESRD. Patient didn't meet his father until he was age 4, but reports  his father died of some kind of cancer.  Social History: Works as a Theme park manager.  Wife is bedside and they have been married 30 years.  Has an occasional glass of wine every few weeks, no tobacco use, or illicit drugs.  Review of Systems: A complete ROS was negative except as per HPI.   Physical Exam: Blood pressure 106/71, pulse 67, temperature 98 F (36.7 C), temperature source Oral, resp. rate 18, height 5\' 8"  (1.727 m), weight 79.8 kg, SpO2 98 %.   General: NAD, nl appearance HE: Normocephalic, atraumatic , EOMI, Conjunctivae are pale ENT: No congestion, no rhinorrhea, no exudate or erythema , dry mucous membranes Cardiovascular: Normal rate, regular rhythm.  Normal S1, metallic S2 Pulmonary : Effort normal, breath sounds normal. No wheezes, rales, or rhonchi Abdominal: soft, nontender,  bowel sounds present Musculoskeletal: no swelling , deformity, injury ,or tenderness in extremities, Skin: Warm, dry , no bruising, erythema, or rash Psychiatric/Behavioral:  normal mood, normal behavior  Neuro: Left sided facial droop, spares forehead ( hx of bells palsy)   EKG: personally reviewed my interpretation normal rate, sinus rhythm,no ST elevation, Q waves lateral and inferior leads. Chest x-ray: No active cardiopulmonary disease  Assessment & Plan by Problem: Active Problems:   Upper GI bleeding  Dennis Bates 59 year old person with PMhx of HTN,HLD, Bells palsy( left side of face) ,CAD, MI s/p DES to Circumflex- OM2, bicuspid valve s/p prosthetic aortic valve replacement who presents for evaluation of shortness of breath and found to have a hemoglobin of 5.  Symptomatic anemia Upper GI bleeding  He has had melena for the past few weeks and positive fecal occult blood in the emergency department.Patient is on warfarin status post aortic valve replacement. He checks his INR at home and he shows his phone log from 2 days which show INR of 2.3.  INR was drawn after starting blood in the ED,  but with history of stable INR so do not think patient needs a emergent reversal.  Blood transfusion has been started and plan to give 2 units and then check H&H.  Patient is asymptomatic at rest, but reports he gets lightheaded with standing up.  With recent history of iron deficiency and blood loss we will check iron studies.  [facility] has been consulted in the emergency department, ED provider says Dr. Tarri Glenn has put the patient on the list to be seen in the morning.  Plan -Follow-up iron studies -Follow-up posttransfusion H&H -Transfuse for hemoglobin less than 7 or if patient is symptomatic with signs of  ongoing blood loss - Trend CBC -Telemetry - Continue Protonix drip -Hold aspirin and warfarin -GI consulted in ED, appreciate recommendations   Type 2 diabetes Patient reports his recent hemoglobin A1c was 6.7.  A few months ago some medications were switched around for his type 2 diabetes.  Reports he was on Trulicity and [place] and didn't feel well. His PCP swithched him back to Metformin. Plan:  -SSI-S -Check CBG's q4hrs   History of MI, prosthetic aortic valve -Holding Aspirin and Coreg in setting of symptomatic anemia  Mixed hyperlipidemia  -NPO, on simvastatin and gemfibrozil at home  Diet:NPO DVT ppx: SCD's Code: Full   Dispo: Admit patient to Inpatient with expected length of stay greater than 2 midnights.  Signed: Thurmon Fair, MD PGY1

## 2020-01-18 NOTE — ED Triage Notes (Signed)
Pt reports 2 weeks of increased fatigue and DOE, pt very pale in triage. Denies bright red blood in stool but stool has been darker than normal.

## 2020-01-19 DIAGNOSIS — K921 Melena: Secondary | ICD-10-CM

## 2020-01-19 DIAGNOSIS — D509 Iron deficiency anemia, unspecified: Secondary | ICD-10-CM

## 2020-01-19 DIAGNOSIS — D649 Anemia, unspecified: Secondary | ICD-10-CM

## 2020-01-19 LAB — COMPREHENSIVE METABOLIC PANEL
ALT: 13 U/L (ref 0–44)
AST: 16 U/L (ref 15–41)
Albumin: 3.5 g/dL (ref 3.5–5.0)
Alkaline Phosphatase: 27 U/L — ABNORMAL LOW (ref 38–126)
Anion gap: 11 (ref 5–15)
BUN: 14 mg/dL (ref 6–20)
CO2: 21 mmol/L — ABNORMAL LOW (ref 22–32)
Calcium: 8.8 mg/dL — ABNORMAL LOW (ref 8.9–10.3)
Chloride: 104 mmol/L (ref 98–111)
Creatinine, Ser: 0.86 mg/dL (ref 0.61–1.24)
GFR calc Af Amer: >60 mL/min (ref >60)
GFR calc non Af Amer: >60 mL/min (ref >60)
Glucose, Bld: 128 mg/dL — ABNORMAL HIGH (ref 70–99)
Potassium: 3.7 mmol/L (ref 3.5–5.1)
Sodium: 136 mmol/L (ref 135–145)
Total Bilirubin: 2.2 mg/dL — ABNORMAL HIGH (ref 0.3–1.2)
Total Protein: 5.8 g/dL — ABNORMAL LOW (ref 6.5–8.1)

## 2020-01-19 LAB — PREPARE RBC (CROSSMATCH)

## 2020-01-19 LAB — GLUCOSE, CAPILLARY
Glucose-Capillary: 111 mg/dL — ABNORMAL HIGH (ref 70–99)
Glucose-Capillary: 119 mg/dL — ABNORMAL HIGH (ref 70–99)
Glucose-Capillary: 155 mg/dL — ABNORMAL HIGH (ref 70–99)
Glucose-Capillary: 197 mg/dL — ABNORMAL HIGH (ref 70–99)
Glucose-Capillary: 135 mg/dL — ABNORMAL HIGH (ref 70–99)
Glucose-Capillary: 133 mg/dL — ABNORMAL HIGH (ref 70–99)

## 2020-01-19 LAB — CBC
HCT: 22 % — ABNORMAL LOW (ref 39.0–52.0)
Hemoglobin: 7.1 g/dL — ABNORMAL LOW (ref 13.0–17.0)
MCH: 26.6 pg (ref 26.0–34.0)
MCHC: 32.3 g/dL (ref 30.0–36.0)
MCV: 82.4 fL (ref 80.0–100.0)
Platelets: 278 10*3/uL (ref 150–400)
RBC: 2.67 MIL/uL — ABNORMAL LOW (ref 4.22–5.81)
RDW: 14.6 % (ref 11.5–15.5)
WBC: 6.3 10*3/uL (ref 4.0–10.5)
nRBC: 0.3 % — ABNORMAL HIGH (ref 0.0–0.2)

## 2020-01-19 LAB — IRON AND TIBC
Iron: 33 ug/dL — ABNORMAL LOW (ref 45–182)
Saturation Ratios: 7 % — ABNORMAL LOW (ref 17.9–39.5)
TIBC: 493 ug/dL — ABNORMAL HIGH (ref 250–450)
UIBC: 460 ug/dL

## 2020-01-19 LAB — RETICULOCYTES
Immature Retic Fract: 42.8 % — ABNORMAL HIGH (ref 2.3–15.9)
RBC.: 2.68 MIL/uL — ABNORMAL LOW (ref 4.22–5.81)
Retic Count, Absolute: 114.2 10*3/uL (ref 19.0–186.0)
Retic Ct Pct: 4.3 % — ABNORMAL HIGH (ref 0.4–3.1)

## 2020-01-19 LAB — HEMOGLOBIN A1C
Hgb A1c MFr Bld: 5.9 % — ABNORMAL HIGH (ref 4.8–5.6)
Mean Plasma Glucose: 122.63 mg/dL

## 2020-01-19 LAB — PROTIME-INR
INR: 2.6 — ABNORMAL HIGH (ref 0.8–1.2)
Prothrombin Time: 26.8 seconds — ABNORMAL HIGH (ref 11.4–15.2)

## 2020-01-19 LAB — HEMOGLOBIN AND HEMATOCRIT, BLOOD
HCT: 26.7 % — ABNORMAL LOW (ref 39.0–52.0)
Hemoglobin: 8.5 g/dL — ABNORMAL LOW (ref 13.0–17.0)

## 2020-01-19 LAB — HIV ANTIBODY (ROUTINE TESTING W REFLEX): HIV Screen 4th Generation wRfx: NONREACTIVE

## 2020-01-19 LAB — FERRITIN: Ferritin: 6 ng/mL — ABNORMAL LOW (ref 24–336)

## 2020-01-19 MED ORDER — SODIUM CHLORIDE 0.9% IV SOLUTION: Freq: Once | INTRAVENOUS | Status: AC

## 2020-01-19 MED ORDER — PANTOPRAZOLE SODIUM 40 MG IV SOLR
40.0000 mg | Freq: Two times a day (BID) | INTRAVENOUS | Status: DC
  Administered 2020-01-19 – 2020-01-23 (×10): 40 mg via INTRAVENOUS
  Filled 2020-01-19 (×10): qty 40

## 2020-01-19 MED ORDER — SODIUM CHLORIDE 0.9 % IV SOLN
510.0000 mg | Freq: Once | INTRAVENOUS | Status: AC
  Administered 2020-01-19: 510 mg via INTRAVENOUS
  Filled 2020-01-19: qty 17

## 2020-01-19 NOTE — Evaluation (Signed)
Physical Therapy Evaluation Patient Details Name: Dennis Bates MRN: [id number] DOB: 1961/05/31 Today's Date: 01/19/2020   History of Present Illness  Pt is a 59 y/o male admitted secondary to worsening SOB and weakness. Pt found to have Symptomatic Anemia with Associated Upper GI Bleed. PMH including but not limited to CAD, DM and HTN.    Clinical Impression  Pt presented supine in bed with HOB elevated, awake and willing to participate in therapy session. Prior to admission, pt reported that he was independent with all functional mobility and ADLs. 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 Pt lives with his wife in a two level home with two steps to enter. At the time of evaluation, pt overall moving very well. He was at a supervision to independent level with all functional mobility including hallway ambulation without use of an AD. Pt also participated in stair training without difficulties. Pt on RA throughout with SpO2 maintaining 95% and HR stable in the 80's. PT answered all of pt's questions at end of session. No further acute PT needs identified at this time. PT signing off.     Follow Up Recommendations No PT follow up    Equipment Recommendations  None recommended by PT    Recommendations for Other Services       Precautions / Restrictions Precautions Precautions: None Restrictions Weight Bearing Restrictions: No      Mobility  Bed Mobility Overal bed mobility: Independent                Transfers Overall transfer level: Independent                  Ambulation/Gait Ambulation/Gait assistance: Modified independent (Device/Increase time) Gait Distance (Feet): 500 Feet Assistive device: None Gait Pattern/deviations: Step-through pattern;Decreased stride length;Drifts right/left Gait velocity: able to fluctuate   General Gait Details: pt with mild instability but no overt LOB or need for physical  assistance  Stairs Stairs: Yes Stairs assistance: Supervision Stair Management: No rails;Alternating pattern;Forwards Number of Stairs: 10 General stair comments: no instability or LOB  Wheelchair Mobility    Modified Rankin (Stroke Patients Only)       Balance Overall balance assessment: Modified Independent                                           Pertinent Vitals/Pain Pain Assessment: No/denies pain    Home Living Family/patient expects to be discharged to:: Private residence Living Arrangements: Spouse/significant other Available Help at Discharge: Family Type of Home: House Home Access: Stairs to enter   Secretary/administrator of Steps: 2 Home Layout: Two level Home Equipment: None      Prior Function Level of Independence: Independent         Comments: is a Education officer, environmental, enjoys walking his Programmer, applications [employer]        Extremity/Trunk Assessment   Upper Extremity Assessment Upper Extremity Assessment: Overall WFL for tasks assessed    Lower Extremity Assessment Lower Extremity Assessment: Overall WFL for tasks assessed    Cervical / Trunk Assessment Cervical / Trunk Assessment: Normal  Communication   Communication: No difficulties  Cognition Arousal/Alertness: Awake/alert Behavior During Therapy: WFL for tasks assessed/performed Overall Cognitive Status: Within Functional Limits for tasks assessed  General Comments      Exercises     Assessment/Plan    PT Assessment Patent does not need any further PT services  PT Problem List         PT Treatment Interventions      PT Goals (Current goals can be found in the Care Plan section)  Acute Rehab PT Goals Patient Stated Goal: to figure out what's going on and to get back to his PLOF PT Goal Formulation: All assessment and education complete, DC therapy    Frequency     Barriers to discharge         Co-evaluation               AM-PAC PT "6 Clicks" Mobility  Outcome Measure Help needed turning from your back to your side while in a flat bed without using bedrails?: None Help needed moving from lying on your back to sitting on the side of a flat bed without using bedrails?: None Help needed moving to and from a bed to a chair (including a wheelchair)?: None Help needed standing up from a chair using your arms (e.g., wheelchair or bedside chair)?: None Help needed to walk in hospital room?: None Help needed climbing 3-5 steps with a railing? : None 6 Click Score: 24    End of Session   Activity Tolerance: Patient tolerated treatment well Patient left: in bed;with call bell/phone within reach Nurse Communication: Mobility status PT Visit Diagnosis: Other abnormalities of gait and mobility (R26.89)    Time: 3846-6599 PT Time Calculation (min) (ACUTE ONLY): 41 min   Charges:   PT Evaluation $PT Eval Low Complexity: 1 Low PT Treatments $Gait Training: 8-22 mins $Therapeutic Activity: 8-22 mins        Anastasio Champion, DPT  Acute Rehabilitation Services Pager [phone] Office [place] 01/19/2020, 4:38 PM

## 2020-01-19 NOTE — Progress Notes (Addendum)
Subjective:  Dennis Bates was seen at bedside this morning. He states that he is feeling much better after receiving his 2 units of blood earlier this morning, he is looking forward to his third transfusion. He denies any dizziness, palpitations, or shortness of breath while resting. He did endorse SHOB when walking up steps and having to wait several minutes to catch his breath. We discussed working with PT today, which he is amendable to. All questions and concerns were addressed.   Objective:  Vital signs in last 24 hours: Vitals:   01/19/20 0511 01/19/20 0820 01/19/20 0835 01/19/20 0851  BP: 106/71 97/64  107/71  Pulse: 67 68  66  Resp: 18 16  16   Temp: 98 F (36.7 C) 97.6 F (36.4 C) 97.7 F (36.5 C) 97.7 F (36.5 C)  TempSrc: Oral Oral Oral Oral  SpO2: 98% 100%  100%  Weight:      Height:       Physical Exam Vitals and nursing note reviewed.  Constitutional:      General: He is not in acute distress.    Appearance: Normal appearance. He is not ill-appearing or toxic-appearing.  Eyes:     General:        Right eye: No discharge.        Left eye: No discharge.     Conjunctiva/sclera: Conjunctivae normal.  Cardiovascular:     Rate and Rhythm: Normal rate and regular rhythm.     Pulses: Normal pulses.     Heart sounds: Normal heart sounds. No murmur. No friction rub. No gallop.   Pulmonary:     Effort: Pulmonary effort is normal.     Breath sounds: Normal breath sounds.  Abdominal:     General: Bowel sounds are normal.     Palpations: Abdomen is soft.     Tenderness: There is no abdominal tenderness. There is no guarding.  Musculoskeletal:        General: No swelling.  Neurological:     Mental Status: He is alert.     Assessment/Plan:  Active Problems:   Upper GI bleeding   Symptomatic anemia   Melena   Iron deficiency anemia  Mr. Quito is a 59 year old male, with a PMHx of HTN, HLD, L sided Bells palsy, CAD, MI (sp circumflex-OM2, bicuspid valve sp  aortic valve replacement), who presented for [facility] and admitted for symptomatic anemia.   Symptomatic Anemia with Associated Upper GI Bleed:  Patient initially presenting to [facility] with SHOB, and several week history of dark stools, and found to have a hgb of 5. Patient was given 2U of PRBCs in the ED with a hgb of 7.1. Receiving 3rd unit in light of CAD with goal hgb >8. Iron studies show iron deficiency anemia with a ferritin of 6, Iron of 33, TIBC of 493.  - GI consulted, we appreciate their recommendations.   - Diet increased to clear liquid.   - Proceed with EGD and colonoscopy when INR <1.6 - Continue holding ASA and warfarin - Continue IV Protonix - Transfusion when Hgb <8.0 in setting of CAD.  - Faraheme 510 mg IV - CBC -Telemetry - PT Eval and Treat  Type 2 Diabetes: Patient with Hgb A1c of 5.9% presented to [facility]. Controlled on Metformin 1000 mg BID. - SSI - Monitor CBG's Q4H  History of MI with Prosthetic Valve:  - Holding ASA, Warfarin, and Coreg in light of GI Bleed.   Prior to Admission Living Arrangement: Home Anticipated  Discharge Location: Home Barriers to Discharge: Home Dispo: Anticipated discharge in approximately 3-4 days.   Maudie Mercury, MD 01/19/2020, 11:15 AM Pager: [phone]

## 2020-01-19 NOTE — Progress Notes (Addendum)
[place] for Iron Replacement Indication: Iron deficiency anemia  No Known Allergies  Patient Measurements: Height: 5\' 8"  (172.7 cm) Weight: 79.8 kg (176 lb) IBW/kg (Calculated) : 68.4  Vital Signs: Temp: 97.7 F (36.5 C) (05/08 0851) Temp Source: Oral (05/08 0851) BP: 107/71 (05/08 0851) Pulse Rate: 66 (05/08 0851) Intake/Output from previous day: 05/07 0701 - 05/08 0700 In: 717.2 [I.V.:87.2; Blood:630] Out: 0  Intake/Output from this shift: Total I/O In: 30 [I.V.:30] Out: -   Labs: Recent Labs    01/18/20 1503 01/19/20 0250  WBC 7.7 6.3  HGB 5.0* 7.1*  HCT 17.4* 22.0*  PLT 375 278  CREATININE 0.97 0.86  ALBUMIN 3.7 3.5  PROT 6.3* 5.8*  AST 18 16  ALT 14 13  ALKPHOS 29* 27*  BILITOT 1.0 2.2*   Estimated Creatinine Clearance: 90.6 mL/min (by C-G formula based on SCr of 0.86 mg/dL).   Medical History: Past Medical History:  Diagnosis Date  . Aortic stenosis due to bicuspid aortic valve    Severe aortic stenosis of bicuspid valve - s/pAVR with [place] ( 09/2015)  . Bell's palsy    LAST EPISODE 09-2015  . CAD S/P percutaneous coronary angioplasty 09/20/2014   a. inferolat STEMI ->> LHC-Angio: Proximal/ostial LAD 20%, OM2 99% -->> PCI: 6mm x 16 mm Promus Premier DES to the OM2.(~3.5 mm)  . Diabetes mellitus type 2, controlled, with complications ([place])   . Hyperlipidemia   . Hypertension   . Hypertriglyceridemia   . ST elevation myocardial infarction (STEMI) of inferior wall (H[facility]) 09/20/2014   99 % occluded Cx-OM2 Promus DES 3.0 mm x 16 mm (3.5 mm)    Medications:  Medications Prior to Admission  Medication Sig Dispense Refill Last Dose  . acetaminophen (TYLENOL) 325 MG tablet Take 2 tablets (650 mg total) by mouth every 6 (six) hours as needed for mild pain.   01/18/2020 at Unknown time  . ASPIRIN 81 PO Take 1 tablet by mouth daily.    01/18/2020 at Unknown time  . carvedilol (COREG) 3.125 MG tablet Take 1 tablet  (3.125 mg total) by mouth 2 (two) times daily. 60 tablet 3 01/18/2020 at 0600  . gemfibrozil (LOPID) 600 MG tablet Take 600 mg by mouth in the morning and at bedtime.   01/18/2020 at Unknown time  . metFORMIN (GLUCOPHAGE) 1000 MG tablet Take 1,000 mg by mouth 2 (two) times daily with a meal.    01/18/2020 at Unknown time  . rosuvastatin (CRESTOR) 20 MG tablet Take 20 mg by mouth at bedtime.   01/17/2020 at Unknown time  . warfarin (COUMADIN) 10 MG tablet Take 10 mg by mouth daily.   01/18/2020 at 0600    Assessment: 59 yo M with progressive weakness and SOB over the last month.  Seen by PCP who noted low Fe and recommended oral supplement.  Pt continued to worsen, presented to ED, labs notable for Hgb 5 and FOB + in the setting of warfarin.  Anemia panel low with Fe 33 and %sat 7.  GI has requested pharmacy dose IV Fe x 1 dose.  Goal of Therapy:  Hgb >8, Fe > 50, %sat > 20  Plan:  Feraheme 510mg  IV x 1 dose  Dose can be repeated in 3 days if desired.  Although given the Fe content of PRBC additional doses are likely not needed.  [employer], Pharm.D., BCPS Clinical Pharmacist Clinical phone for 01/19/2020 from 8:30-4:00 is [phone].  **Pharmacist phone directory can be  found on [email or web address] listed under [facility].  01/19/2020 9:28 AM

## 2020-01-19 NOTE — H&P (View-Only) (Signed)
Referring Provider: Dr. Myrtie Hawk Primary Care Physician:  Gaye Alken, PA-C Primary Gastroenterologist:  Althia Forts   Reason for Consultation:  Anemia, GIB/Melena   HPI: Dennis Bates is a 60 y.o. male with a past medical history of hypertension, hyperlipidemia,  coronary artery disease, STEMI 2016 with PTCA to the LAD and DES to the CX - OM2, aortic stenosis, s/p aortic valve/aortic root replacement due to bicuspid aortic valve in 2017 on Warfarin and ASA, DM II and Bell's palsy. S/P right total knee arthroplasty 2019.  He developed fatigue approximately 4 weeks ago which has progressively worsened since that time.  His friend also noted he appeared pale but he was not initially concerned.  Two weeks ago, his fatigue significantly worsened and he developed DOE. No hemoptysis.  He typically walks his dog 30 minutes twice daily and he had difficulty completing these walks.  Walking 10 feet in his home or walking up 1 flight of stairs resulted in significant shortness of breath.  No chest pain. He underwent an echocardiogram as ordered by his cardiologist, Dr. Ellyn Hack 4 weeks ago which was reported as stable.  He also noted passing loose black stools for 2 to 3 days approximately 3 to 4 weeks ago which resolved for 1 week then the black stools recurred for few days last week.  He denies taking any Pepto-Bismol.  He typically passes a normal formed brown bowel movement daily without rectal bleeding.  He contacted his primary care physician Dr. Renne Musca in [facility] and laboratory studies were done which apparently showed his iron level was low.  He was started on ferrous sulfate 325 mg once daily which she started less than 1 week ago. He has noticed a greenish discoloration to his stools since starting ferrous sulfate.  Yesterday, his fatigue was profound.  He described feeling as if he was 59 years old.  He was advised by his PCP to go to the ED. He presented to [facility] ED on 01/18/2020 for  further evaluation.  In the ED, his laboratory studies showed a Hg 5.0. Na+ 130. BUN 20. Cr. 0.97 and normal LFTs. Rectal exam by the ER physician black stool guaiac +.  He was started on a Protonix infusion.  He received 2 units of packed red blood cells.  A posttransfusion hemoglobin was 7.0.  He remains NPO.  Currently, he reports feeling much better.  He denies having any shortness of breath or chest pain at this time.  He denies having any history of GI bleed.  He never has heartburn.  No dysphagia.  He has infrequent stomach upset for which he takes Tums with relief.  His last dose of ASA and Warfarin 10 mg was taken at 6 AM yesterday morning.  No other NSAID use.  He drinks 2 glasses of wine weekly.  He underwent a colonoscopy approximately 5 years ago by a gastroenterologist in [place], he reported having a few polyps with instructions to repeat a colonoscopy in 10 years.  No known family history of upper GI or colorectal cancer.  No fever, sweats or chills.  He has lost 9 pounds over the past 2 to 3 weeks.  No other complaints today.  No family at the bedside.  ED course:  EKG:  NSR, no acute ischemia   Laboratory studies 01/18/2020: Na 130. K 4.2. C02 20. Glu 199. BUN 20. Cr. 0.97. Anion gap 11. Alk phos 29. Albumin 3.7. AST 18. ALT 14. WBC 7.7. Hg 5.0. (Hg 12.0 on 12/12/2017).  HCT 17.4. MCV 83.3. PLT 375. INR 2.8. Troponin 12. Influenza A & B negative. Sars Coronavirus 2 negative. FOBT +.  Laboratory studies 01/19/2020: Na 136. K 3.7. BUN 14. Cr. 0.86. Alk phos 27. AST 16. ALT 13. T.bili 2.2. Iron 33. TIBC 493. Ferritin 6. INR 2.6.   ECHO 12/25/2019: 1. Left ventricular ejection fraction, by estimation, is 60 to 65%. The left ventricle has normalfunction. The left ventricle has no regional wall motion abnormalities. There is mild left ventricular hypertrophy. Left ventricular diastolic parameters are consistent with Grade II diastolic dysfunction (pseudonormalization). Elevated left ventricular  end-diastolic pressure. 2. Right ventricular systolic function is normal. The right ventricular size is normal. There is normal pulmonary artery systolic pressure. The estimated right ventricular systolic pressure is 44.0 mmHg. 3. Left atrial size was mildly dilated. 4. The mitral valve is normal in structure. Trivial mitral valve regurgitation. No evidence of mitral stenosis. 5. The aortic valve has been repaired/replaced. Aortic valve regurgitation is trivial, likely normal washing jet. There is a 23 mm St. Jude mechanical valve present in the aortic position. Procedure Date: 10/09/2015. 6. Aortic root/ascending aorta has been repaired/replaced and grossly normal appearance of aorta repair, however incompletely visualized. 7. The inferior vena cava is normal in size with greater than 50% respiratory variability, suggesting right atrial pressure of 3 mmHg.  Past Medical History:  Diagnosis Date  . Aortic stenosis due to bicuspid aortic valve    Severe aortic stenosis of bicuspid valve - s/pAVR with [place] ( 09/2015)  . Bell's palsy    LAST EPISODE 09-2015  . CAD S/P percutaneous coronary angioplasty 09/20/2014   a. inferolat STEMI ->> LHC-Angio: Proximal/ostial LAD 20%, OM2 99% -->> PCI: 56m x 16 mm Promus Premier DES to the OM2.(~3.5 mm)  . Diabetes mellitus type 2, controlled, with complications ([place]   . Hyperlipidemia   . Hypertension   . Hypertriglyceridemia   . ST elevation myocardial infarction (STEMI) of inferior wall (HCC) 09/20/2014   99 % occluded Cx-OM2 Promus DES 3.0 mm x 16 mm (3.5 mm)    Past Surgical History:  Procedure Laterality Date  . BENTALL PROCEDURE N/A 10/09/2015   Procedure: BENTALL PROCEDURE (using a St Jude mechanical valve, size 23);  Surgeon: BGaye Pollack MD;  [place]  Service: Open Heart Surgery;  Laterality: N/A;  CIRC ARRESTNEEDS RIGHT RADIAL A-LINE  . KNEE ARTHROSCOPY W/ ACL RECONSTRUCTION Right    90's; "2 ATHROSCOPIC , 2 REBUILDS"  . LEFT  HEART CATHETERIZATION WITH CORONARY ANGIOGRAM N/A 09/20/2014   Procedure: LEFT HEART CATHETERIZATION WITH CORONARY ANGIOGRAM;  Surgeon: DLeonie Man MD;  [facility];  Proximal/ostial LAD 20%, OM2 99% -->> aspiration thrombectomy and DES PCI:   . PERCUTANEOUS CORONARY STENT INTERVENTION (PCI-S)  09/20/2014   Aspiration thrombectomy and DES PCI- OM 2(m-dCx): Promus Premier DES 3.0 mm x 16 mm (3.5 mm)  . RIGHT/LEFT HEART CATH AND CORONARY ANGIOGRAPHY  09/2015   Dr. TShelva Majestic Widely patent OM 2 stent.  Mild disease in RCA and LAD.Relatively normal right heart cath pressures.  Normal cardiac output.  . TEE WITHOUT CARDIOVERSION N/A 10/09/2015   Procedure: TRANSESOPHAGEAL ECHOCARDIOGRAM (TEE);  Surgeon: BGaye Pollack MD;  [place] [facility]  Service: Open Heart Surgery;  Laterality: N/A;  . TOTAL KNEE ARTHROPLASTY Right 12/09/2017   Procedure: RIGHT TOTAL KNEE ARTHROPLASTY REMOVAL OF HARDWARE RIGHT KNEE;  Surgeon: GDorna Leitz MD;  Location: [facility];  Service: Orthopedics;  Laterality: Right;  . TRANSTHORACIC ECHOCARDIOGRAM  02/2016   Post AVR: mechanical: AVR without obstuction. LVEF improved to 65-70%.  Domingo Dimes ECHOCARDIOGRAM  January 2016, February2016   Probable bicuspid aortic valve: a. mod-sev by 2D ECHO 09/2014. AVA 0.9cm^2; b) 10/2014 Echo:. Severe AS Mn-Pk Gradient 41-71 mmHg, AVA ~0.79 cm2, EF 60-65%    Prior to Admission medications   Medication Sig Start Date End Date Taking? Authorizing Provider  acetaminophen (TYLENOL) 325 MG tablet Take 2 tablets (650 mg total) by mouth every 6 (six) hours as needed for mild pain. 10/17/15  Yes Lars Pinks M, PA-C  ASPIRIN 81 PO Take 1 tablet by mouth daily.    Yes [provider]  carvedilol (COREG) 3.125 MG tablet Take 1 tablet (3.125 mg total) by mouth 2 (two) times daily. 09/01/17 01/18/20 Yes Leonie Man, MD  gemfibrozil (LOPID) 600 MG tablet Take 600 mg by mouth in the morning and at bedtime. 11/25/19  Yes  [provider]  metFORMIN (GLUCOPHAGE) 1000 MG tablet Take 1,000 mg by mouth 2 (two) times daily with a meal.  03/27/14  Yes [provider]  rosuvastatin (CRESTOR) 20 MG tablet Take 20 mg by mouth at bedtime. 11/29/19  Yes [provider]  warfarin (COUMADIN) 10 MG tablet Take 10 mg by mouth daily. 12/11/19  Yes [provider]    Current Facility-Administered Medications  Medication Dose Route Frequency Provider Last Rate Last Admin  . acetaminophen (TYLENOL) tablet 650 mg  650 mg Oral Q6H PRN Masoudi, Elhamalsadat, MD       Or  . acetaminophen (TYLENOL) suppository 650 mg  650 mg Rectal Q6H PRN Masoudi, Elhamalsadat, MD      . insulin aspart (novoLOG) injection 0-9 Units  0-9 Units Subcutaneous Q4H Masoudi, Elhamalsadat, MD   1 Units at 01/18/20 2145  . ondansetron (ZOFRAN) tablet 4 mg  4 mg Oral Q6H PRN Masoudi, Elhamalsadat, MD       Or  . ondansetron (ZOFRAN) injection 4 mg  4 mg Intravenous Q6H PRN Masoudi, Elhamalsadat, MD      . pantoprazole (PROTONIX) 80 mg in sodium chloride 0.9 % 100 mL (0.8 mg/mL) infusion  8 mg/hr Intravenous Continuous Carlisle Cater, PA-C 10 mL/hr at 01/18/20 2117 8 mg/hr at 01/18/20 2117  . sodium chloride flush (NS) 0.9 % injection 3 mL  3 mL Intravenous Q12H Masoudi, Elhamalsadat, MD        Allergies as of 01/18/2020  . (No Known Allergies)   Family history: Father died from cancer, further details unknown.  Mother died at age 66 due to complications from chronic tobacco use and end-stage renal disease.  He has a brother age 13 who is living and well.  Social history: The patient is married.  He is a [employer] manager.  Non-smoker.  He drinks 2 glasses of wine every 1 to 2 weeks.  No history of drug use.  Review of Systems: Gen: + 9lb weight loss. Denies fever, sweats or chills.  CV: Denies chest pain, palpitations or edema. Resp: See HPI. GI: Denies heartburn, dysphagia, stomach or lower abdominal pain. No diarrhea or  constipation. No rectal bleeding or melena.   GU : Denies urinary burning, blood in urine, increased urinary frequency or incontinence. MS: Denies joint pain, muscles aches or weakness. Derm: Denies rash, itchiness, skin lesions or unhealing ulcers. Psych: Denies depression, anxiety, memory loss, suicidal ideation or confusion. Heme: Denies easy bruising, bleeding. Neuro:  Denies headaches, dizziness or paresthesias. Endo:  Denies any problems with DM, thyroid or adrenal function.  Physical Exam: Vital signs in last 24 hours: Temp:  [97.7 F (36.5 C)-99.4 F (37.4 C)] 98 F (36.7 C) (05/08 0511) Pulse Rate:  [67-96] 67 (05/08 0511) Resp:  [13-20] 18 (05/08 0511) BP: (100-126)/(58-81) 106/71 (05/08 0511) SpO2:  [96 %-100 %] 98 % (05/08 0511) Weight:  [79.8 kg] 79.8 kg (05/07 1455)   General:  Alert pale appearing 59 year old male in no acute distress. Head:  Normocephalic and atraumatic. Eyes:  No scleral icterus. Conjunctiva pink. Ears:  Normal auditory acuity. Nose:  No deformity, discharge or lesions. Mouth: Few dentition.  No ulcers or lesions.  Buccal mucosa is pale. Neck:  Supple. Lungs: Breath sounds clear throughout. Heart: Regular rate and rhythm, mechanical valve sounds, soft systolic murmur. Abdomen: Soft, nontender, positive bowel sounds all 4 quadrants.  No HSM. Rectal: Deferred.  Total exam done by the ER PA-C identified black stool FOBT positive. Musculoskeletal:  Symmetrical without gross deformities.  Pulses:  Normal pulses noted. Extremities:  Without clubbing or edema. Neurologic:  Alert and  oriented x4. No focal deficits.  Skin:  Intact without significant lesions or rashes. Psych:  Alert and cooperative. Normal mood and affect.  Intake/Output from previous day: 05/07 0701 - 05/08 0700 In: 630 [Blood:630] Out: -  Intake/Output this shift: Total I/O In: 315 [Blood:315] Out: -   Lab Results: Recent Labs    01/18/20 1503 01/19/20 0250  WBC 7.7 6.3   HGB 5.0* 7.1*  HCT 17.4* 22.0*  PLT 375 278   BMET Recent Labs    01/18/20 1503 01/19/20 0250  NA 130* 136  K 4.2 3.7  CL 99 104  CO2 20* 21*  GLUCOSE 199* 128*  BUN 20 14  CREATININE 0.97 0.86  CALCIUM 9.1 8.8*   LFT Recent Labs    01/19/20 0250  PROT 5.8*  ALBUMIN 3.5  AST 16  ALT 13  ALKPHOS 27*  BILITOT 2.2*   PT/INR Recent Labs    01/18/20 1917 01/19/20 0250  LABPROT 28.9* 26.8*  INR 2.8* 2.6*   Hepatitis Panel No results for input(s): HEPBSAG, HCVAB, HEPAIGM, HEPBIGM in the last 72 hours.    Studies/Results: No results found.  IMPRESSION/PLAN:  38. 59 year old male with a history of coronary artery disease, STEMI 2016 with PTCA to the LAD and DES to the CX - OM2, aortic stenosis, s/p aortic valve/aortic root replacement due to bicuspid aortic valve on chronic ASA and Warfarin (last dose taken at 6am 5/7) admitted to the hospital with SOB, melena and profound anemia. Admission Hg 5.0. Transfused 2 units of PRBCs. Post transfusion Hg 7.1.   -Hold Warfarin  -EGD and colonoscopy when INR < 1.6. -Continue Pantoprazole '8mg'$ /hr infusion -Serial H/H -Transfuse for Hg < 8 in patient with CAD -Pharmacy consult for IV iron -IVF per the hospitalist  -Clear liquid diet  2.  History of colon polyps, last colonoscopy 5 years ago per the patient's report, records not available   3.  Diabetes mellitus type 2  4. Total bili elevated, most likely due to GIB/hemolysis. Normal AST/ALT  Further recommendations per Dr. Adline Peals Dorathy Daft  01/19/2020, [id number]

## 2020-01-19 NOTE — Plan of Care (Signed)
  Problem: Education: Goal: Knowledge of General Education information will improve Description: Including pain rating scale, medication(s)/side effects and non-pharmacologic comfort measures Outcome: Progressing   Problem: Clinical Measurements: Goal: Diagnostic test results will improve Outcome: Progressing   Problem: Elimination: Goal: Will not experience complications related to bowel motility Outcome: Progressing   

## 2020-01-19 NOTE — Consult Note (Signed)
Referring Provider: Dr. Myrtie Hawk Primary Care Physician:  Gaye Alken, PA-C Primary Gastroenterologist:  Althia Forts   Reason for Consultation:  Anemia, GIB/Melena   HPI: Dennis Bates is a 59 y.o. male with a past medical history of hypertension, hyperlipidemia,  coronary artery disease, STEMI 2016 with PTCA to the LAD and DES to the CX - OM2, aortic stenosis, s/p aortic valve/aortic root replacement due to bicuspid aortic valve in 2017 on Warfarin and ASA, DM II and Bell's palsy. S/P right total knee arthroplasty 2019.  He developed fatigue approximately 4 weeks ago which has progressively worsened since that time.  His friend also noted he appeared pale but he was not initially concerned.  Two weeks ago, his fatigue significantly worsened and he developed DOE. No hemoptysis.  He typically walks his dog 30 minutes twice daily and he had difficulty completing these walks.  Walking 10 feet in his home or walking up 1 flight of stairs resulted in significant shortness of breath.  No chest pain. He underwent an echocardiogram as ordered by his cardiologist, Dr. Ellyn Hack 4 weeks ago which was reported as stable.  He also noted passing loose black stools for 2 to 3 days approximately 3 to 4 weeks ago which resolved for 1 week then the black stools recurred for few days last week.  He denies taking any Pepto-Bismol.  He typically passes a normal formed brown bowel movement daily without rectal bleeding.  He contacted his primary care physician Dr. Renne Musca in [facility] and laboratory studies were done which apparently showed his iron level was low.  He was started on ferrous sulfate 325 mg once daily which she started less than 1 week ago. He has noticed a greenish discoloration to his stools since starting ferrous sulfate.  Yesterday, his fatigue was profound.  He described feeling as if he was 59 years old.  He was advised by his PCP to go to the ED. He presented to [facility] ED on 01/18/2020 for  further evaluation.  In the ED, his laboratory studies showed a Hg 5.0. Na+ 130. BUN 20. Cr. 0.97 and normal LFTs. Rectal exam by the ER physician black stool guaiac +.  He was started on a Protonix infusion.  He received 2 units of packed red blood cells.  A posttransfusion hemoglobin was 7.0.  He remains NPO.  Currently, he reports feeling much better.  He denies having any shortness of breath or chest pain at this time.  He denies having any history of GI bleed.  He never has heartburn.  No dysphagia.  He has infrequent stomach upset for which he takes Tums with relief.  His last dose of ASA and Warfarin 10 mg was taken at 6 AM yesterday morning.  No other NSAID use.  He drinks 2 glasses of wine weekly.  He underwent a colonoscopy approximately 5 years ago by a gastroenterologist in [place], he reported having a few polyps with instructions to repeat a colonoscopy in 10 years.  No known family history of upper GI or colorectal cancer.  No fever, sweats or chills.  He has lost 9 pounds over the past 2 to 3 weeks.  No other complaints today.  No family at the bedside.  ED course:  EKG:  NSR, no acute ischemia   Laboratory studies 01/18/2020: Na 130. K 4.2. C02 20. Glu 199. BUN 20. Cr. 0.97. Anion gap 11. Alk phos 29. Albumin 3.7. AST 18. ALT 14. WBC 7.7. Hg 5.0. (Hg 12.0 on 12/12/2017).  HCT 17.4. MCV 83.3. PLT 375. INR 2.8. Troponin 12. Influenza A & B negative. Sars Coronavirus 2 negative. FOBT +.  Laboratory studies 01/19/2020: Na 136. K 3.7. BUN 14. Cr. 0.86. Alk phos 27. AST 16. ALT 13. T.bili 2.2. Iron 33. TIBC 493. Ferritin 6. INR 2.6.   ECHO 12/25/2019: 1. Left ventricular ejection fraction, by estimation, is 60 to 65%. The left ventricle has normalfunction. The left ventricle has no regional wall motion abnormalities. There is mild left ventricular hypertrophy. Left ventricular diastolic parameters are consistent with Grade II diastolic dysfunction (pseudonormalization). Elevated left ventricular  end-diastolic pressure. 2. Right ventricular systolic function is normal. The right ventricular size is normal. There is normal pulmonary artery systolic pressure. The estimated right ventricular systolic pressure is 22.9 mmHg. 3. Left atrial size was mildly dilated. 4. The mitral valve is normal in structure. Trivial mitral valve regurgitation. No evidence of mitral stenosis. 5. The aortic valve has been repaired/replaced. Aortic valve regurgitation is trivial, likely normal washing jet. There is a 23 mm St. Jude mechanical valve present in the aortic position. Procedure Date: 10/09/2015. 6. Aortic root/ascending aorta has been repaired/replaced and grossly normal appearance of aorta repair, however incompletely visualized. 7. The inferior vena cava is normal in size with greater than 50% respiratory variability, suggesting right atrial pressure of 3 mmHg.  Past Medical History:  Diagnosis Date  . Aortic stenosis due to bicuspid aortic valve    Severe aortic stenosis of bicuspid valve - s/pAVR with Bentall ( 09/2015)  . Bell's palsy    LAST EPISODE 09-2015  . CAD S/P percutaneous coronary angioplasty 09/20/2014   a. inferolat STEMI ->> LHC-Angio: Proximal/ostial LAD 20%, OM2 99% -->> PCI: 13m x 16 mm Promus Premier DES to the OM2.(~3.5 mm)  . Diabetes mellitus type 2, controlled, with complications ([facility]   . Hyperlipidemia   . Hypertension   . Hypertriglyceridemia   . ST elevation myocardial infarction (STEMI) of inferior wall (HCC) 09/20/2014   99 % occluded Cx-OM2 Promus DES 3.0 mm x 16 mm (3.5 mm)    Past Surgical History:  Procedure Laterality Date  . BENTALL PROCEDURE N/A 10/09/2015   Procedure: BENTALL PROCEDURE (using a St Jude mechanical valve, size 23);  Surgeon: BGaye Pollack MD;  [place] [facility]  Service: Open Heart Surgery;  Laterality: N/A;  CIRC ARRESTNEEDS RIGHT RADIAL A-LINE  . KNEE ARTHROSCOPY W/ ACL RECONSTRUCTION Right    90's; "2 ATHROSCOPIC , 2 REBUILDS"  . LEFT  HEART CATHETERIZATION WITH CORONARY ANGIOGRAM N/A 09/20/2014   Procedure: LEFT HEART CATHETERIZATION WITH CORONARY ANGIOGRAM;  Surgeon: DLeonie Man MD;  [facility];  Proximal/ostial LAD 20%, OM2 99% -->> aspiration thrombectomy and DES PCI:   . PERCUTANEOUS CORONARY STENT INTERVENTION (PCI-S)  09/20/2014   Aspiration thrombectomy and DES PCI- OM 2(m-dCx): Promus Premier DES 3.0 mm x 16 mm (3.5 mm)  . RIGHT/LEFT HEART CATH AND CORONARY ANGIOGRAPHY  09/2015   Dr. TShelva Majestic Widely patent OM 2 stent.  Mild disease in RCA and LAD.Relatively normal right heart cath pressures.  Normal cardiac output.  . TEE WITHOUT CARDIOVERSION N/A 10/09/2015   Procedure: TRANSESOPHAGEAL ECHOCARDIOGRAM (TEE);  Surgeon: BGaye Pollack MD;  [place] [facility]  Service: Open Heart Surgery;  Laterality: N/A;  . TOTAL KNEE ARTHROPLASTY Right 12/09/2017   Procedure: RIGHT TOTAL KNEE ARTHROPLASTY REMOVAL OF HARDWARE RIGHT KNEE;  Surgeon: GDorna Leitz MD;  Location: [facility];  Service: Orthopedics;  Laterality: Right;  . TRANSTHORACIC ECHOCARDIOGRAM  02/2016   Post AVR: mechanical: AVR without obstuction. LVEF improved to 65-70%.  Domingo Dimes ECHOCARDIOGRAM  January 2016, February2016   Probable bicuspid aortic valve: a. mod-sev by 2D ECHO 09/2014. AVA 0.9cm^2; b) 10/2014 Echo:. Severe AS Mn-Pk Gradient 41-71 mmHg, AVA ~0.79 cm2, EF 60-65%    Prior to Admission medications   Medication Sig Start Date End Date Taking? Authorizing Provider  acetaminophen (TYLENOL) 325 MG tablet Take 2 tablets (650 mg total) by mouth every 6 (six) hours as needed for mild pain. 10/17/15  Yes Lars Pinks M, PA-C  ASPIRIN 81 PO Take 1 tablet by mouth daily.    Yes [provider]  carvedilol (COREG) 3.125 MG tablet Take 1 tablet (3.125 mg total) by mouth 2 (two) times daily. 09/01/17 01/18/20 Yes Leonie Man, MD  gemfibrozil (LOPID) 600 MG tablet Take 600 mg by mouth in the morning and at bedtime. 11/25/19  Yes  [provider]  metFORMIN (GLUCOPHAGE) 1000 MG tablet Take 1,000 mg by mouth 2 (two) times daily with a meal.  03/27/14  Yes [provider]  rosuvastatin (CRESTOR) 20 MG tablet Take 20 mg by mouth at bedtime. 11/29/19  Yes [provider]  warfarin (COUMADIN) 10 MG tablet Take 10 mg by mouth daily. 12/11/19  Yes [provider]    Current Facility-Administered Medications  Medication Dose Route Frequency Provider Last Rate Last Admin  . acetaminophen (TYLENOL) tablet 650 mg  650 mg Oral Q6H PRN Masoudi, Elhamalsadat, MD       Or  . acetaminophen (TYLENOL) suppository 650 mg  650 mg Rectal Q6H PRN Masoudi, Elhamalsadat, MD      . insulin aspart (novoLOG) injection 0-9 Units  0-9 Units Subcutaneous Q4H Masoudi, Elhamalsadat, MD   1 Units at 01/18/20 2145  . ondansetron (ZOFRAN) tablet 4 mg  4 mg Oral Q6H PRN Masoudi, Elhamalsadat, MD       Or  . ondansetron (ZOFRAN) injection 4 mg  4 mg Intravenous Q6H PRN Masoudi, Elhamalsadat, MD      . pantoprazole (PROTONIX) 80 mg in sodium chloride 0.9 % 100 mL (0.8 mg/mL) infusion  8 mg/hr Intravenous Continuous Carlisle Cater, PA-C 10 mL/hr at 01/18/20 2117 8 mg/hr at 01/18/20 2117  . sodium chloride flush (NS) 0.9 % injection 3 mL  3 mL Intravenous Q12H Masoudi, Elhamalsadat, MD        Allergies as of 01/18/2020  . (No Known Allergies)   Family history: Father died from cancer, further details unknown.  Mother died at age 43 due to complications from chronic tobacco use and end-stage renal disease.  He has a brother age 35 who is living and well.  Social history: The patient is married.  He is a [employer] manager.  Non-smoker.  He drinks 2 glasses of wine every 1 to 2 weeks.  No history of drug use.  Review of Systems: Gen: + 9lb weight loss. Denies fever, sweats or chills.  CV: Denies chest pain, palpitations or edema. Resp: See HPI. GI: Denies heartburn, dysphagia, stomach or lower abdominal pain. No diarrhea or  constipation. No rectal bleeding or melena.   GU : Denies urinary burning, blood in urine, increased urinary frequency or incontinence. MS: Denies joint pain, muscles aches or weakness. Derm: Denies rash, itchiness, skin lesions or unhealing ulcers. Psych: Denies depression, anxiety, memory loss, suicidal ideation or confusion. Heme: Denies easy bruising, bleeding. Neuro:  Denies headaches, dizziness or paresthesias. Endo:  Denies any problems with DM, thyroid or adrenal function.  Physical Exam: Vital signs in last 24 hours: Temp:  [97.7 F (36.5 C)-99.4 F (37.4 C)] 98 F (36.7 C) (05/08 0511) Pulse Rate:  [67-96] 67 (05/08 0511) Resp:  [13-20] 18 (05/08 0511) BP: (100-126)/(58-81) 106/71 (05/08 0511) SpO2:  [96 %-100 %] 98 % (05/08 0511) Weight:  [79.8 kg] 79.8 kg (05/07 1455)   General:  Alert pale appearing 59 year old male in no acute distress. Head:  Normocephalic and atraumatic. Eyes:  No scleral icterus. Conjunctiva pink. Ears:  Normal auditory acuity. Nose:  No deformity, discharge or lesions. Mouth: Few dentition.  No ulcers or lesions.  Buccal mucosa is pale. Neck:  Supple. Lungs: Breath sounds clear throughout. Heart: Regular rate and rhythm, mechanical valve sounds, soft systolic murmur. Abdomen: Soft, nontender, positive bowel sounds all 4 quadrants.  No HSM. Rectal: Deferred.  Total exam done by the ER PA-C identified black stool FOBT positive. Musculoskeletal:  Symmetrical without gross deformities.  Pulses:  Normal pulses noted. Extremities:  Without clubbing or edema. Neurologic:  Alert and  oriented x4. No focal deficits.  Skin:  Intact without significant lesions or rashes. Psych:  Alert and cooperative. Normal mood and affect.  Intake/Output from previous day: 05/07 0701 - 05/08 0700 In: 630 [Blood:630] Out: -  Intake/Output this shift: Total I/O In: 315 [Blood:315] Out: -   Lab Results: Recent Labs    01/18/20 1503 01/19/20 0250  WBC 7.7 6.3   HGB 5.0* 7.1*  HCT 17.4* 22.0*  PLT 375 278   BMET Recent Labs    01/18/20 1503 01/19/20 0250  NA 130* 136  K 4.2 3.7  CL 99 104  CO2 20* 21*  GLUCOSE 199* 128*  BUN 20 14  CREATININE 0.97 0.86  CALCIUM 9.1 8.8*   LFT Recent Labs    01/19/20 0250  PROT 5.8*  ALBUMIN 3.5  AST 16  ALT 13  ALKPHOS 27*  BILITOT 2.2*   PT/INR Recent Labs    01/18/20 1917 01/19/20 0250  LABPROT 28.9* 26.8*  INR 2.8* 2.6*   Hepatitis Panel No results for input(s): HEPBSAG, HCVAB, HEPAIGM, HEPBIGM in the last 72 hours.    Studies/Results: No results found.  IMPRESSION/PLAN:  84. 59 year old male with a history of coronary artery disease, STEMI 2016 with PTCA to the LAD and DES to the CX - OM2, aortic stenosis, s/p aortic valve/aortic root replacement due to bicuspid aortic valve on chronic ASA and Warfarin (last dose taken at 6am 5/7) admitted to the hospital with SOB, melena and profound anemia. Admission Hg 5.0. Transfused 2 units of PRBCs. Post transfusion Hg 7.1.   -Hold Warfarin  -EGD and colonoscopy when INR < 1.6. -Continue Pantoprazole '8mg'$ /hr infusion -Serial H/H -Transfuse for Hg < 8 in patient with CAD -Pharmacy consult for IV iron -IVF per the hospitalist  -Clear liquid diet  2.  History of colon polyps, last colonoscopy 5 years ago per the patient's report, records not available   3.  Diabetes mellitus type 2  4. Total bili elevated, most likely due to GIB/hemolysis. Normal AST/ALT  Further recommendations per Dr. Adline Peals Dorathy Daft  01/19/2020, 0:813AM

## 2020-01-20 DIAGNOSIS — Z7901 Long term (current) use of anticoagulants: Secondary | ICD-10-CM

## 2020-01-20 LAB — HEPATIC FUNCTION PANEL
ALT: 12 U/L (ref 0–44)
AST: 16 U/L (ref 15–41)
Albumin: 3.4 g/dL — ABNORMAL LOW (ref 3.5–5.0)
Alkaline Phosphatase: 26 U/L — ABNORMAL LOW (ref 38–126)
Bilirubin, Direct: 0.2 mg/dL (ref 0.0–0.2)
Indirect Bilirubin: 1.7 mg/dL — ABNORMAL HIGH (ref 0.3–0.9)
Total Bilirubin: 1.9 mg/dL — ABNORMAL HIGH (ref 0.3–1.2)
Total Protein: 6 g/dL — ABNORMAL LOW (ref 6.5–8.1)

## 2020-01-20 LAB — BASIC METABOLIC PANEL
Anion gap: 9 (ref 5–15)
BUN: 8 mg/dL (ref 6–20)
CO2: 24 mmol/L (ref 22–32)
Calcium: 9.2 mg/dL (ref 8.9–10.3)
Chloride: 107 mmol/L (ref 98–111)
Creatinine, Ser: 0.99 mg/dL (ref 0.61–1.24)
GFR calc Af Amer: >60 mL/min (ref >60)
GFR calc non Af Amer: >60 mL/min (ref >60)
Glucose, Bld: 118 mg/dL — ABNORMAL HIGH (ref 70–99)
Potassium: 4.2 mmol/L (ref 3.5–5.1)
Sodium: 140 mmol/L (ref 135–145)

## 2020-01-20 LAB — CBC
HCT: 25.8 % — ABNORMAL LOW (ref 39.0–52.0)
HCT: 29.3 % — ABNORMAL LOW (ref 39.0–52.0)
Hemoglobin: 8.1 g/dL — ABNORMAL LOW (ref 13.0–17.0)
Hemoglobin: 9.2 g/dL — ABNORMAL LOW (ref 13.0–17.0)
MCH: 26.8 pg (ref 26.0–34.0)
MCH: 27 pg (ref 26.0–34.0)
MCHC: 31.4 g/dL (ref 30.0–36.0)
MCHC: 31.4 g/dL (ref 30.0–36.0)
MCV: 85.4 fL (ref 80.0–100.0)
MCV: 85.9 fL (ref 80.0–100.0)
Platelets: 278 10*3/uL (ref 150–400)
Platelets: 346 10*3/uL (ref 150–400)
RBC: 3.02 MIL/uL — ABNORMAL LOW (ref 4.22–5.81)
RBC: 3.41 MIL/uL — ABNORMAL LOW (ref 4.22–5.81)
RDW: 15.7 % — ABNORMAL HIGH (ref 11.5–15.5)
RDW: 15.9 % — ABNORMAL HIGH (ref 11.5–15.5)
WBC: 5 10*3/uL (ref 4.0–10.5)
WBC: 5.1 10*3/uL (ref 4.0–10.5)
nRBC: 0.4 % — ABNORMAL HIGH (ref 0.0–0.2)
nRBC: 0.6 % — ABNORMAL HIGH (ref 0.0–0.2)

## 2020-01-20 LAB — GLUCOSE, CAPILLARY
Glucose-Capillary: 118 mg/dL — ABNORMAL HIGH (ref 70–99)
Glucose-Capillary: 119 mg/dL — ABNORMAL HIGH (ref 70–99)
Glucose-Capillary: 134 mg/dL — ABNORMAL HIGH (ref 70–99)
Glucose-Capillary: 149 mg/dL — ABNORMAL HIGH (ref 70–99)
Glucose-Capillary: 157 mg/dL — ABNORMAL HIGH (ref 70–99)
Glucose-Capillary: 99 mg/dL (ref 70–99)

## 2020-01-20 LAB — PROTIME-INR
INR: 2.2 — ABNORMAL HIGH (ref 0.8–1.2)
Prothrombin Time: 23.8 seconds — ABNORMAL HIGH (ref 11.4–15.2)

## 2020-01-20 MED ORDER — BISACODYL 5 MG PO TBEC
10.0000 mg | DELAYED_RELEASE_TABLET | Freq: Once | ORAL | Status: AC
  Administered 2020-01-21: 10 mg via ORAL
  Filled 2020-01-20 (×2): qty 2

## 2020-01-20 MED ORDER — BISACODYL 5 MG PO TBEC
10.0000 mg | DELAYED_RELEASE_TABLET | Freq: Once | ORAL | Status: AC
  Administered 2020-01-20: 10 mg via ORAL
  Filled 2020-01-20: qty 2

## 2020-01-20 MED ORDER — SODIUM CHLORIDE 0.9 % IV SOLN: INTRAVENOUS | Status: DC

## 2020-01-20 MED ORDER — PEG-KCL-NACL-NASULF-NA ASC-C 100 G PO SOLR
1.0000 | Freq: Once | ORAL | Status: AC
  Administered 2020-01-20: 200 g via ORAL
  Filled 2020-01-20: qty 1

## 2020-01-20 NOTE — Progress Notes (Signed)
   Subjective: No acute events overnight.  Feeling well. He had a large dark BM this morning. He walked around with his family yesterday and did notice some dyspnea on exertion. He denies any current shortness of breath, chest pain, abdominal pain, light headedness, palpitations.  He had several questions about the procedure, risk of recurrent bleed and signs to look out for if this were to happen again which I discussed in detail with him.   Objective:  Vital signs in last 24 hours: Vitals:   01/19/20 1133 01/19/20 1640 01/19/20 2041 01/20/20 0604  BP: 109/65 (!) 126/91 107/70 (!) 111/54  Pulse: 61 65 66 67  Resp: 16 18 16 16   Temp: 98 F (36.7 C) 98 F (36.7 C) 98 F (36.7 C) 97.8 F (36.6 C)  TempSrc: Oral Oral Oral Oral  SpO2: 100% 100% 98% 97%  Weight:   78.5 kg   Height:       General: awake, alert, sitting up in his chair in NAD CV: RRR; metallic S2 click  Pulm: normal work of breathing; lungs CTAB Abd: Soft, non-distended, non-tender   Assessment/Plan:  Active Problems:   Upper GI bleeding   Symptomatic anemia   Melena   Iron deficiency anemia  Mr. Kleiman is a 59 year old male, with a PMHx of HTN, HLD, L sided Bells palsy, CAD, MI (sp circumflex-OM2, bicuspid valve sp aortic valve replacement), who presented for [facility] and admitted for symptomatic anemia.   Symptomatic Anemia with: - likely UGIB based on history of progressive shortness of breath, dark stools. He has remained HDS.  - s/p 3 unit pRBCs thus far; Hgb this morning at 8.1. Given large BM reported will check another CBC this afternoon to ensure he does not need more blood products. Transfusion goal is hgb > 8 given his CAD history  - s/p feraheme infusion yesterday  - appreciate GI consult; plan to proceed with endoscopic evaluation once INR < 1.6   Type 2 Diabetes: Patient with Hgb A1c of 5.9% presented to [facility]. Controlled on Metformin 1000 mg BID. - SSI  History of MI with Prosthetic Valve:  -  Holding ASA, Warfarin, and Coreg in light of GI Bleed - will consider bridging with heparin after endoscopic procedure   Prior to Admission Living Arrangement: home Anticipated Discharge Location: home Barriers to Discharge: continued medical work-up  Dispo: Anticipated discharge in approximately 1-2 day(s).   [facility] D, DO 01/20/2020, 6:11 AM Pager: [phone]

## 2020-01-20 NOTE — Progress Notes (Addendum)
     [facility] Progress Note  CC:  Anemia  Assessment Plan: Symptomatic iron deficiency anemia with recent melena/FOBT+ stools  Mechanical AVR on chronic warfarin, on hold in preparation for the procedure CAD with history of PCI in setting of STEMI Indirect hyperbilirubinemia Prior colonoscopy 5 years ago in [facility] with a polyp removed No known family members with GI malignancy history  INR is 2.2 today. Hopefully it will be <1.6 tomorrow. Discussed prepping today in hopes that endoscopy can be performed tomorrow versus waiting another 24 hours to prep. He would prefer to prep today.   Plan: EGD and colonoscopy tomorrow if INR is <1.6  Will proceed with endoscopy. Given comorbidities and anticoagulants, the procedure is high risk. The nature of the procedure, as well as the risks, benefits, and alternatives were carefully and thoroughly reviewed with the patient. Ample time for discussion and questions allowed. The patient understood, was satisfied, and agreed to proceed.  Subjective: No overt bleeding. GI ROS is negative.  No new symptoms. No family present at the time of my evaluation.   Objective:  Vital signs in last 24 hours: Temp:  [97.8 F (36.6 C)-98 F (36.7 C)] 97.8 F (36.6 C) (05/09 0811) Pulse Rate:  [64-67] 64 (05/09 0811) Resp:  [16-18] 18 (05/09 0811) BP: (107-126)/(54-91) 115/75 (05/09 0811) SpO2:  [97 %-100 %] 100 % (05/09 0811) Weight:  [78.5 kg] 78.5 kg (05/08 2041)   General:   Alert, in NAD. Sitting in bedside chair.  Abdomen:  Soft. Nontender. Nondistended. Normal bowel sounds. No rebound or guarding. LAD: No inguinal or umbilical LAD Extremities:  Without edema. Neurologic:  Alert and  oriented x4;  grossly normal neurologically. Psych:  Alert and cooperative. Normal mood and affect.  Lab Results: Recent Labs    01/18/20 1503 01/18/20 1503 01/19/20 0250 01/19/20 2000 01/20/20 0315  WBC 7.7  --  6.3  --  5.0  HGB  5.0*   < > 7.1* 8.5* 8.1*  HCT 17.4*   < > 22.0* 26.7* 25.8*  PLT 375  --  278  --  278   < > = values in this interval not displayed.   BMET Recent Labs    01/18/20 1503 01/19/20 0250 01/20/20 0315  NA 130* 136 140  K 4.2 3.7 4.2  CL 99 104 107  CO2 20* 21* 24  GLUCOSE 199* 128* 118*  BUN 20 14 8   CREATININE 0.97 0.86 0.99  CALCIUM 9.1 8.8* 9.2   LFT Recent Labs    01/20/20 0315  PROT 6.0*  ALBUMIN 3.4*  AST 16  ALT 12  ALKPHOS 26*  BILITOT 1.9*  BILIDIR 0.2  IBILI 1.7*   PT/INR Recent Labs    01/19/20 0250 01/20/20 0315  LABPROT 26.8* 23.8*  INR 2.6* 2.2*     LOS: 2 days   03/21/20  01/20/2020, 1:37 PM

## 2020-01-20 NOTE — Plan of Care (Signed)
  Problem: Clinical Measurements: Goal: Diagnostic test results will improve Outcome: Progressing   Problem: Elimination: Goal: Will not experience complications related to bowel motility Outcome: Progressing   

## 2020-01-20 NOTE — Anesthesia Preprocedure Evaluation (Addendum)
Anesthesia Evaluation  Patient identified by MRN, date of birth, ID band Patient awake    Reviewed: Allergy & Precautions, NPO status , Patient's Chart, lab work & pertinent test results, reviewed documented beta blocker date and time   History of Anesthesia Complications Negative for: history of anesthetic complications  Airway Mallampati: II  TM Distance: >3 FB Neck ROM: Full    Dental  (+) Missing,    Pulmonary neg pulmonary ROS,    Pulmonary exam normal        Cardiovascular hypertension, Pt. on medications and Pt. on home beta blockers + CAD, + Past MI (2016) and + Cardiac Stents  Normal cardiovascular exam  AS s/p Bentall procedure 2017  TTE 12/2019: EF 60-65%, grade 2 DD   Neuro/Psych negative neurological ROS  negative psych ROS   GI/Hepatic negative GI ROS, Neg liver ROS,   Endo/Other  diabetes, Type 2, Oral Hypoglycemic Agents  Renal/GU negative Renal ROS  negative genitourinary   Musculoskeletal  (+) Arthritis ,   Abdominal   Peds  Hematology  (+) anemia , Hgb 9.2, INR 2.2   Anesthesia Other Findings Day of surgery medications reviewed with patient.  Reproductive/Obstetrics negative OB ROS                            Anesthesia Physical Anesthesia Plan  ASA: III  Anesthesia Plan: MAC   Post-op Pain Management:    Induction:   PONV Risk Score and Plan: Treatment may vary due to age or medical condition and Propofol infusion  Airway Management Planned: Natural Airway and Nasal Cannula  Additional Equipment: None  Intra-op Plan:   Post-operative Plan:   Informed Consent: I have reviewed the patients History and Physical, chart, labs and discussed the procedure including the risks, benefits and alternatives for the proposed anesthesia with the patient or authorized representative who has indicated his/her understanding and acceptance.       Plan Discussed with:  CRNA  Anesthesia Plan Comments:        Anesthesia Quick Evaluation

## 2020-01-21 ENCOUNTER — Inpatient Hospital Stay (HOSPITAL_COMMUNITY): Payer: BC Managed Care – PPO | Admitting: Anesthesiology

## 2020-01-21 ENCOUNTER — Encounter (HOSPITAL_COMMUNITY): Payer: Self-pay | Admitting: Internal Medicine

## 2020-01-21 ENCOUNTER — Inpatient Hospital Stay (HOSPITAL_COMMUNITY): Payer: BC Managed Care – PPO

## 2020-01-21 ENCOUNTER — Encounter (HOSPITAL_COMMUNITY)
Admission: EM | Disposition: A | Discharge status: Home / Self Care | Payer: Self-pay | Source: Home / Self Care | Attending: Internal Medicine

## 2020-01-21 DIAGNOSIS — R195 Other fecal abnormalities: Secondary | ICD-10-CM

## 2020-01-21 HISTORY — PX: COLONOSCOPY WITH PROPOFOL: SHX5780

## 2020-01-21 HISTORY — PX: ESOPHAGOGASTRODUODENOSCOPY (EGD) WITH PROPOFOL: SHX5813

## 2020-01-21 LAB — GLUCOSE, CAPILLARY
Glucose-Capillary: 102 mg/dL — ABNORMAL HIGH (ref 70–99)
Glucose-Capillary: 109 mg/dL — ABNORMAL HIGH (ref 70–99)
Glucose-Capillary: 116 mg/dL — ABNORMAL HIGH (ref 70–99)
Glucose-Capillary: 126 mg/dL — ABNORMAL HIGH (ref 70–99)
Glucose-Capillary: 127 mg/dL — ABNORMAL HIGH (ref 70–99)
Glucose-Capillary: 140 mg/dL — ABNORMAL HIGH (ref 70–99)

## 2020-01-21 LAB — CBC
HCT: 28.7 % — ABNORMAL LOW (ref 39.0–52.0)
Hemoglobin: 8.9 g/dL — ABNORMAL LOW (ref 13.0–17.0)
MCH: 26.9 pg (ref 26.0–34.0)
MCHC: 31 g/dL (ref 30.0–36.0)
MCV: 86.7 fL (ref 80.0–100.0)
Platelets: 333 10*3/uL (ref 150–400)
RBC: 3.31 MIL/uL — ABNORMAL LOW (ref 4.22–5.81)
RDW: 16.2 % — ABNORMAL HIGH (ref 11.5–15.5)
WBC: 5.3 10*3/uL (ref 4.0–10.5)
nRBC: 0 % (ref 0.0–0.2)

## 2020-01-21 LAB — BASIC METABOLIC PANEL
Anion gap: 11 (ref 5–15)
BUN: 6 mg/dL (ref 6–20)
CO2: 21 mmol/L — ABNORMAL LOW (ref 22–32)
Calcium: 9.4 mg/dL (ref 8.9–10.3)
Chloride: 109 mmol/L (ref 98–111)
Creatinine, Ser: 0.95 mg/dL (ref 0.61–1.24)
GFR calc Af Amer: >60 mL/min (ref >60)
GFR calc non Af Amer: >60 mL/min (ref >60)
Glucose, Bld: 142 mg/dL — ABNORMAL HIGH (ref 70–99)
Potassium: 3.9 mmol/L (ref 3.5–5.1)
Sodium: 141 mmol/L (ref 135–145)

## 2020-01-21 LAB — PROTIME-INR
INR: 1.7 — ABNORMAL HIGH (ref 0.8–1.2)
Prothrombin Time: 19.1 seconds — ABNORMAL HIGH (ref 11.4–15.2)

## 2020-01-21 SURGERY — ESOPHAGOGASTRODUODENOSCOPY (EGD) WITH PROPOFOL
Anesthesia: Monitor Anesthesia Care

## 2020-01-21 MED ORDER — LIDOCAINE 2% (20 MG/ML) 5 ML SYRINGE
INTRAMUSCULAR | Status: DC | PRN
  Administered 2020-01-21: 40 mg via INTRAVENOUS

## 2020-01-21 MED ORDER — IOHEXOL 350 MG/ML SOLN
100.0000 mL | Freq: Once | INTRAVENOUS | Status: AC | PRN
  Administered 2020-01-21: 100 mL via INTRAVENOUS

## 2020-01-21 MED ORDER — PROPOFOL 10 MG/ML IV BOLUS
INTRAVENOUS | Status: DC | PRN
Start: 2020-01-21 — End: 2020-01-21
  Administered 2020-01-21: 20 mg via INTRAVENOUS
  Administered 2020-01-21 (×2): 10 mg via INTRAVENOUS

## 2020-01-21 MED ORDER — PROPOFOL 500 MG/50ML IV EMUL
INTRAVENOUS | Status: DC | PRN
  Administered 2020-01-21: 150 ug/kg/min via INTRAVENOUS

## 2020-01-21 MED ORDER — ENOXAPARIN SODIUM 80 MG/0.8ML ~~LOC~~ SOLN
1.0000 mg/kg | Freq: Two times a day (BID) | SUBCUTANEOUS | Status: DC
  Administered 2020-01-21 – 2020-01-23 (×5): 80 mg via SUBCUTANEOUS
  Filled 2020-01-21 (×6): qty 0.8

## 2020-01-21 MED ORDER — LACTATED RINGERS IV SOLN
INTRAVENOUS | Status: AC | PRN
  Administered 2020-01-21: 1000 mL via INTRAVENOUS

## 2020-01-21 SURGICAL SUPPLY — 25 items

## 2020-01-21 NOTE — Transfer of Care (Signed)
Immediate Anesthesia Transfer of Care Note  Patient: Dennis Bates  Procedure(s) Performed: ESOPHAGOGASTRODUODENOSCOPY (EGD) WITH PROPOFOL (N/A ) COLONOSCOPY WITH PROPOFOL (N/A )  Patient Location: PACU  Anesthesia Type:MAC  Level of Consciousness: sedated, patient cooperative and responds to stimulation  Airway & Oxygen Therapy: Patient Spontanous Breathing and Patient connected to nasal cannula oxygen  Post-op Assessment: Report given to RN, Post -op Vital signs reviewed and stable and Patient moving all extremities  Post vital signs: Reviewed and stable  Last Vitals:  Vitals Value Taken Time  BP    Temp    Pulse    Resp    SpO2      Last Pain:  Vitals:   01/21/20 0753  TempSrc: Oral  PainSc:          Complications: No apparent anesthesia complications

## 2020-01-21 NOTE — H&P (View-Only) (Signed)
Discussed EGD/colonoscopy findings with Dr. Gupta.  CTA done and result pending (I suspect it will not show active SB bleeding)  Discussed SB video capsule with the patient tomorrow, and he is agreeable.  1% risk capsule retention.  Orders placed.  NPO after MN.   

## 2020-01-21 NOTE — Interval H&P Note (Signed)
History and Physical Interval Note:  01/21/2020 8:27 AM  Murrell Converse  has presented today for surgery, with the diagnosis of Anemia, GI bleed.  The various methods of treatment have been discussed with the patient and family. After consideration of risks, benefits and other options for treatment, the patient has consented to  Procedure(s): ESOPHAGOGASTRODUODENOSCOPY (EGD) WITH PROPOFOL (N/A) COLONOSCOPY WITH PROPOFOL (N/A) as a surgical intervention.  The patient's history has been reviewed, patient examined, no change in status, stable for surgery.  I have reviewed the patient's chart and labs.  Questions were answered to the patient's satisfaction.     Lynann Bologna

## 2020-01-21 NOTE — Op Note (Signed)
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 Estimated Blood Loss:     Estimated blood loss: none. Procedure:                Pre-Anesthesia Assessment:                           - Prior to the procedure, a History and Physical                            was performed, and patient medications and                            allergies were reviewed. The patient's tolerance of                            previous anesthesia was also reviewed. The risks                            and benefits of the procedure and the sedation                            options and risks were discussed with the patient.                            All questions were answered, and informed consent                            was obtained. Prior  Anticoagulants: The patient has                            taken Coumadin (warfarin), last dose was 3 days                            prior to procedure. ASA Grade Assessment: III - A                            patient with severe systemic disease. After                            reviewing the risks and benefits, the patient was                            deemed in satisfactory condition to undergo  the                            procedure.                           After obtaining informed consent, the colonoscope                            was passed under direct vision. Throughout the                            procedure, the patient's blood pressure, pulse, and                            oxygen saturations were monitored continuously. The                            [id number]-HQ190L ([id number]) Olympus colonoscope was                            introduced through the anus and advanced to the 2                            cm into the ileum. The colonoscopy was performed                            without difficulty. The patient tolerated the                            procedure well. The quality of the bowel                            preparation was good. The terminal ileum, ileocecal                            valve, appendiceal orifice, and rectum were  photographed. Scope In: 8:47:16 AM Scope Out: 8:55:44 AM Scope Withdrawal Time: 0 hours 7 minutes 10 seconds  Total Procedure Duration: 0 hours 8 minutes 28 seconds  Findings:      A few rare small-mouthed diverticula were found in the sigmoid colon.      Non-bleeding internal hemorrhoids were found during retroflexion. The       hemorrhoids were small.      The terminal ileum appeared normal.      The exam was otherwise without abnormality on direct and retroflexion       views. Impression:               -Very mild sigmoid diverticulosis.                           -Otherwise normal colonoscopy to TI.                           -No active LGI bleeding. Recommendation:           - Return patient to hospital ward for ongoing care.                           - Clear liquid diet today.                           - IV heparin (pharmacy to dose). Can be started                            ASAP.                           - Can resume p.o. Coumadin d/t AVR (med svc to                            order)                            - Proceed with CTA today. If neg, recommend capsule                            endoscopy.                           - Trend CBC, INR                           - Repeat colonoscopy in 10 years for screening                            purposes. Earlier, if with any new problems or                            there is any change in family history.                           - D/W pt in detail. Procedure Code(s):        --- Professional ---  36644, Colonoscopy, flexible; diagnostic, including                            collection of specimen(s) by brushing or washing,                            when performed (separate procedure) Diagnosis Code(s):        --- Professional ---                           K64.8, Other hemorrhoids                           R19.5, Other fecal abnormalities                           K57.30, Diverticulosis of large intestine without                            perforation or abscess without bleeding CPT copyright 2019 American Medical Association. All rights reserved. The codes documented in this report are preliminary and upon coder review may  be revised to meet current compliance requirements. Lynann Bologna, MD 01/21/2020 9:11:49 AM This report has been signed electronically. Number of Addenda: 0

## 2020-01-21 NOTE — Progress Notes (Signed)
Discussed EGD/colonoscopy findings with Dr. Chales Abrahams.  CTA done and result pending (I suspect it will not show active SB bleeding)  Discussed SB video capsule with the patient tomorrow, and he is agreeable.  1% risk capsule retention.  Orders placed.  NPO after MN.

## 2020-01-21 NOTE — Op Note (Signed)
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 Estimated Blood Loss:     Estimated blood loss: none. Procedure:                Pre-Anesthesia Assessment:                           - Prior to the procedure, a History and Physical                            was performed, and patient medications and                            allergies were reviewed. The patient's tolerance of                            previous anesthesia was also reviewed. The risks                            and benefits of the procedure and the sedation                            options and risks were discussed with the patient.                            All questions were answered, and informed consent                            was  obtained. Prior Anticoagulants: The patient has                            taken Coumadin (warfarin), last dose was 4 days                            prior to procedure. ASA Grade Assessment: III - A                            patient with severe systemic disease. After                            reviewing the risks and benefits, the patient was                            deemed in satisfactory condition to  undergo the                            procedure.                           After obtaining informed consent, the endoscope was                            passed under direct vision. Throughout the                            procedure, the patient's blood pressure, pulse, and                            oxygen saturations were monitored continuously. The                            [id number]-[id number] ([id number] Olympus gastroscope was                            introduced through the mouth, and advanced to the                            second part of duodenum. The upper GI endoscopy was                            accomplished without difficulty. The patient                            tolerated the procedure well. Scope In: Scope Out: Findings:      The examined esophagus was normal with well-defined Z-line at 40 cm.      The entire examined stomach was normal.      A 10 mm non-bleeding diverticulum was found in the second portion of the       duodenum.      The exam was otherwise without abnormality. Impression:               -Incidental non-bleeding duodenal diverticulum.                           -  Otherwise normal EGD.                           -No biopsies were taken since INR was elevated. Recommendation:           - Return patient to hospital ward for ongoing care.                           - Clear liquid diet.                           - Continue present medications.                           - Trend CBC                           - Proceed with colonoscopy. Procedure Code(s):        --- Professional ---                           [phone], Esophagogastroduodenoscopy, flexible,                            transoral; diagnostic, including collection of                            specimen(s) by brushing or washing, when performed                            (separate procedure) Diagnosis Code(s):        --- Professional ---                           R19.5, Other fecal abnormalities                            K57.10, Diverticulosis of small intestine without                            perforation or abscess without bleeding CPT copyright 2019 American Medical Association. All rights reserved. The codes documented in this report are preliminary and upon coder review may  be revised to meet current compliance requirements. Jackquline Denmark, MD 01/21/2020 9:04:52 AM This report has been signed electronically. Number of Addenda: 0

## 2020-01-21 NOTE — Anesthesia Postprocedure Evaluation (Signed)
Anesthesia Post Note  Patient: Dennis Bates  Procedure(s) Performed: ESOPHAGOGASTRODUODENOSCOPY (EGD) WITH PROPOFOL (N/A ) COLONOSCOPY WITH PROPOFOL (N/A )     Patient location during evaluation: PACU Anesthesia Type: MAC Level of consciousness: awake and alert and oriented Pain management: pain level controlled Vital Signs Assessment: post-procedure vital signs reviewed and stable Respiratory status: spontaneous breathing, nonlabored ventilation and respiratory function stable Cardiovascular status: blood pressure returned to baseline Postop Assessment: no apparent nausea or vomiting Anesthetic complications: no    Last Vitals:  Vitals:   01/21/20 0900 01/21/20 0910  BP: (!) 87/56 105/67  Pulse: 68 72  Resp: 13 17  Temp: 36.6 C   SpO2: 98% 100%    Last Pain:  Vitals:   01/21/20 0900  TempSrc: Oral  PainSc: 0-No pain                 Brennan Bailey

## 2020-01-21 NOTE — Progress Notes (Addendum)
Subjective:  ON events: None   Mr. Nabor had just gotten back from endoscopy on our evaluation so was a little groggy. He was disappointed that the source of bleeding so far has been unrevealing, but understands the plan to proceed with CT imaging and capsule endoscopy if that is unrevealing. He had several questions regarding current bleed and risks of future bleed which were all addressed.    Objective:  Vital signs in last 24 hours: Vitals:   01/20/20 0811 01/20/20 1630 01/20/20 2103 01/21/20 0519  BP: 115/75 106/68 114/82 109/71  Pulse: 64 (!) 56 65 66  Resp: 18 16 14 16   Temp: 97.8 F (36.6 C) 98.1 F (36.7 C) 97.9 F (36.6 C) 98.8 F (37.1 C)  TempSrc: Oral Oral Oral Oral  SpO2: 100% 100% 100% 100%  Weight:   78 kg   Height:       Physical Exam Constitutional:      General: He is not in acute distress.    Appearance: He is not ill-appearing or toxic-appearing.  HENT:     Head: Normocephalic and atraumatic.  Cardiovascular:     Rate and Rhythm: Normal rate and regular rhythm.  Pulmonary:     Effort: Pulmonary effort is normal.     Breath sounds: Normal breath sounds. No decreased breath sounds, wheezing, rhonchi or rales.  Abdominal:     General: Bowel sounds are normal.     Palpations: Abdomen is soft. There is no mass.     Tenderness: There is no abdominal tenderness. There is no guarding.  Neurological:     Mental Status: He is alert.    CBC Latest Ref Rng & Units 01/21/2020 01/20/2020 01/20/2020  WBC 4.0 - 10.5 K/uL 5.3 5.1 5.0  Hemoglobin 13.0 - 17.0 g/dL 8.9(L) 9.2(L) 8.1(L)  Hematocrit 39.0 - 52.0 % 28.7(L) 29.3(L) 25.8(L)  Platelets 150 - 400 K/uL 333 346 278    BMP Latest Ref Rng & Units 01/21/2020 01/20/2020 01/19/2020  Glucose 70 - 99 mg/dL 142(H) 118(H) 128(H)  BUN 6 - 20 mg/dL 6 8 14   Creatinine 0.61 - 1.24 mg/dL 0.95 0.99 0.86  Sodium 135 - 145 mmol/L 141 140 136  Potassium 3.5 - 5.1 mmol/L 3.9 4.2 3.7  Chloride 98 - 111 mmol/L 109 107 104  CO2  22 - 32 mmol/L 21(L) 24 21(L)  Calcium 8.9 - 10.3 mg/dL 9.4 9.2 8.8(L)     Assessment/Plan:  Active Problems:   Upper GI bleeding   Symptomatic anemia   Melena   Iron deficiency anemia   Chronic anticoagulation  Mr. Marsalis is a 59 year old male, with a PMHx of HTN, HLD, L sided Bells palsy, CAD, MI (sp circumflex-OM2, bicuspid valve sp aortic valve replacement), who presented for [facility] and admitted for symptomatic anemia.   Symptomatic Anemia: Likely UGIB based on history of progressive shortness of breath, dark stools. He has remained HDS. S/P 3 PRBC transfusions. Additionally, S/P Faraheme on 01/19/2020.  - s/p EGD and Colonoscopy today which did not show source of bleeding. GI recommended proceeding with CTA abdomen, followed by capsule endoscopy if that is unrevealing - Hgb remains stable toady at 8.9.  - Transfusion goal is hgb > 8 given CAD Hx.    Type 2 Diabetes: Patient with Hgb A1c of 5.9% presented to [facility]. Controlled on Metformin 1000 mg BID. - Controlled on SSI  History of MI with Prosthetic Valve:  - Holding ASA - start lovenox today for bridging   Prior  to Admission Living Arrangement: home Anticipated Discharge Location: home Barriers to Discharge: continued medical work-up  Dispo: Anticipated discharge in approximately 1-2 day(s).   Dolan Amen, MD 01/21/2020, 6:46 AM Pager: [phone]

## 2020-01-21 NOTE — Plan of Care (Signed)
  Problem: Education: Goal: Knowledge of General Education information will improve Description: Including pain rating scale, medication(s)/side effects and non-pharmacologic comfort measures Outcome: Progressing   Problem: Elimination: Goal: Will not experience complications related to bowel motility Outcome: Progressing   

## 2020-01-21 NOTE — Progress Notes (Signed)
ANTICOAGULATION CONSULT NOTE - Initial Consult  Pharmacy Consult for Lovenox Indication:  No Known Allergies  Patient Measurements: Height: 5\' 8"  (172.7 cm) Weight: 78 kg (171 lb 15.3 oz) IBW/kg (Calculated) : 68.4 kg  Vital Signs: Temp: 97.8 F (36.6 C) (05/10 0900) Temp Source: Oral (05/10 0900) BP: 120/68 (05/10 0920) Pulse Rate: 67 (05/10 0920)  Labs: Recent Labs    01/18/20 1917 01/18/20 2002 01/19/20 0250 01/19/20 2000 01/20/20 0315 01/20/20 0315 01/20/20 1600 01/21/20 0352  HGB  --   --  7.1*   < > 8.1*   < > 9.2* 8.9*  HCT  --   --  22.0*   < > 25.8*  --  29.3* 28.7*  PLT  --   --  278  --  278  --  346 333  LABPROT   < >  --  26.8*  --  23.8*  --   --  19.1*  INR   < >  --  2.6*  --  2.2*  --   --  1.7*  CREATININE  --   --  0.86  --  0.99  --   --  0.95  TROPONINIHS  --  12  --   --   --   --   --   --    < > = values in this interval not displayed.    Estimated Creatinine Clearance: 82 mL/min (by C-G formula based on SCr of 0.95 mg/dL).   Medical History: Past Medical History:  Diagnosis Date  . Aortic stenosis due to bicuspid aortic valve    Severe aortic stenosis of bicuspid valve - s/pAVR with [place] ( 09/2015)  . Bell's palsy    LAST EPISODE 09-2015  . CAD S/P percutaneous coronary angioplasty 09/20/2014   a. inferolat STEMI ->> LHC-Angio: Proximal/ostial LAD 20%, OM2 99% -->> PCI: 6mm x 16 mm Promus Premier DES to the OM2.(~3.5 mm)  . Diabetes mellitus type 2, controlled, with complications (HCC)   . Hyperlipidemia   . Hypertension   . Hypertriglyceridemia   . ST elevation myocardial infarction (STEMI) of inferior wall (HCC) 09/20/2014   99 % occluded Cx-OM2 Promus DES 3.0 mm x 16 mm (3.5 mm)    Medications:  Medications Prior to Admission  Medication Sig Dispense Refill Last Dose  . acetaminophen (TYLENOL) 325 MG tablet Take 2 tablets (650 mg total) by mouth every 6 (six) hours as needed for mild pain.   01/18/2020 at Unknown time  .  ASPIRIN 81 PO Take 1 tablet by mouth daily.    01/18/2020 at Unknown time  . carvedilol (COREG) 3.125 MG tablet Take 1 tablet (3.125 mg total) by mouth 2 (two) times daily. 60 tablet 3 01/18/2020 at 0600  . gemfibrozil (LOPID) 600 MG tablet Take 600 mg by mouth in the morning and at bedtime.   01/18/2020 at Unknown time  . metFORMIN (GLUCOPHAGE) 1000 MG tablet Take 1,000 mg by mouth 2 (two) times daily with a meal.    01/18/2020 at Unknown time  . rosuvastatin (CRESTOR) 20 MG tablet Take 20 mg by mouth at bedtime.   01/17/2020 at Unknown time  . warfarin (COUMADIN) 10 MG tablet Take 10 mg by mouth daily.   01/18/2020 at 0600    Assessment: 59 y.o male on Coumadin 10mg  daily PTA for mechanical AVR.  Patient checks INRs at home.  Per pt, last was 2.3 on 5/6.  Goal INR 2-3. Coumadin held due to upper GI bleeding, symptomatic  anemia. S/P 3 PRBC transfusions. Additionally, S/P Faraheme on 01/19/2020  5/8 INR 2.6 5/9 INR 2.2 5/10 INR 1.7  S/p EGD, colonoscopy 5/10 done for Fe defic anemia.  Findings: incidental non-bleeding duodenal diverticulum, no biopsies taken since INR elevfated.  Colonscopy: mild sigmoid diverticulosis, no active LGI bleeding, non-bleeding interanl small hemorrhoids.   Pharmacy consulted to start Lovenox bridge for mechanical AVR.   CrCl is 82 ml/min.    Goal of Therapy:  Monitor platelets by anticoagulation protocol: Yes   Plan:  Lovenox 80 mg SQ q12hours (1mg /kg q12h) Monitor renal function and for signs and symptoms of bleeding.  F/u for restart of warfarin.   Thank you for allowing pharmacy to be part of this patients care team.  Nicole Cella, [place] Pharmacist [phone] Please check AMION for all [place] phone numbers After 10:00 PM, call [place] [phone] 01/21/2020,12:29 PM

## 2020-01-21 NOTE — Plan of Care (Signed)
  Problem: Education: Goal: Knowledge of General Education information will improve Description Including pain rating scale, medication(s)/side effects and non-pharmacologic comfort measures Outcome: Progressing   Problem: Health Behavior/Discharge Planning: Goal: Ability to manage health-related needs will improve Outcome: Progressing   

## 2020-01-22 ENCOUNTER — Encounter (HOSPITAL_COMMUNITY)
Admission: EM | Disposition: A | Discharge status: Home / Self Care | Payer: Self-pay | Source: Home / Self Care | Attending: Internal Medicine

## 2020-01-22 HISTORY — PX: GIVENS CAPSULE STUDY: SHX5432

## 2020-01-22 LAB — TYPE AND SCREEN
ABO/RH(D): AB POS
Antibody Screen: NEGATIVE
Unit division: 0
Unit division: 0
Unit division: 0
Unit division: 0

## 2020-01-22 LAB — BASIC METABOLIC PANEL
Anion gap: 8 (ref 5–15)
BUN: <5 mg/dL — ABNORMAL LOW (ref 6–20)
CO2: 22 mmol/L (ref 22–32)
Calcium: 9.2 mg/dL (ref 8.9–10.3)
Chloride: 110 mmol/L (ref 98–111)
Creatinine, Ser: 0.97 mg/dL (ref 0.61–1.24)
GFR calc Af Amer: >60 mL/min (ref >60)
GFR calc non Af Amer: >60 mL/min (ref >60)
Glucose, Bld: 105 mg/dL — ABNORMAL HIGH (ref 70–99)
Potassium: 4.2 mmol/L (ref 3.5–5.1)
Sodium: 140 mmol/L (ref 135–145)

## 2020-01-22 LAB — CBC
HCT: 27.9 % — ABNORMAL LOW (ref 39.0–52.0)
Hemoglobin: 8.6 g/dL — ABNORMAL LOW (ref 13.0–17.0)
MCH: 26.7 pg (ref 26.0–34.0)
MCHC: 30.8 g/dL (ref 30.0–36.0)
MCV: 86.6 fL (ref 80.0–100.0)
Platelets: 321 10*3/uL (ref 150–400)
RBC: 3.22 MIL/uL — ABNORMAL LOW (ref 4.22–5.81)
RDW: 16.2 % — ABNORMAL HIGH (ref 11.5–15.5)
WBC: 5.2 10*3/uL (ref 4.0–10.5)
nRBC: 0 % (ref 0.0–0.2)

## 2020-01-22 LAB — BPAM RBC
Blood Product Expiration Date: 202105142359
Blood Product Expiration Date: 202105292359
Blood Product Expiration Date: 202105292359
Blood Product Expiration Date: 202105292359
ISSUE DATE / TIME: 202105071729
ISSUE DATE / TIME: 202105072102
ISSUE DATE / TIME: 202105080827
Unit Type and Rh: 6200
Unit Type and Rh: 6200
Unit Type and Rh: 6200
Unit Type and Rh: 6200

## 2020-01-22 LAB — PROTIME-INR
INR: 1.5 — ABNORMAL HIGH (ref 0.8–1.2)
Prothrombin Time: 17.1 seconds — ABNORMAL HIGH (ref 11.4–15.2)

## 2020-01-22 LAB — GLUCOSE, CAPILLARY
Glucose-Capillary: 108 mg/dL — ABNORMAL HIGH (ref 70–99)
Glucose-Capillary: 116 mg/dL — ABNORMAL HIGH (ref 70–99)
Glucose-Capillary: 123 mg/dL — ABNORMAL HIGH (ref 70–99)
Glucose-Capillary: 139 mg/dL — ABNORMAL HIGH (ref 70–99)
Glucose-Capillary: 192 mg/dL — ABNORMAL HIGH (ref 70–99)
Glucose-Capillary: 194 mg/dL — ABNORMAL HIGH (ref 70–99)
Glucose-Capillary: 75 mg/dL (ref 70–99)

## 2020-01-22 LAB — HEPARIN ANTI-XA: Heparin LMW: 1.22 IU/mL

## 2020-01-22 SURGERY — IMAGING PROCEDURE, GI TRACT, INTRALUMINAL, VIA CAPSULE
Anesthesia: LOCAL

## 2020-01-22 MED ORDER — WARFARIN - PHARMACIST DOSING INPATIENT: Freq: Every day | Status: DC

## 2020-01-22 MED ORDER — WARFARIN SODIUM 7.5 MG PO TABS
7.5000 mg | ORAL_TABLET | Freq: Once | ORAL | Status: AC
  Administered 2020-01-22: 7.5 mg via ORAL
  Filled 2020-01-22: qty 1

## 2020-01-22 SURGICAL SUPPLY — 1 items: TOWEL COTTON PACK 4EA (MISCELLANEOUS) ×4 IMPLANT

## 2020-01-22 NOTE — Progress Notes (Signed)
Pharmacist rounding with the IMTS/B2/Lane Service. Discussed with the team the utility of Anti-Xa Heparin Level determinations as a laboratory monitoring parameter for patients on LMWH enoxaparin/Lovenox at the 1mg /kg SQ Q12 dose. This value (1.22 u/mL) was obtained at steady-state, (after third dose) and appropriately timed relative to the scheduled dosing administration time of 1000h today. Test value shows collection of 1405h (4h after today's 1000h dose). Typically the anti-Xa heparin level for LMWH is 0.5 - 1.2 u/mL when collected at the 4th hour. Currently patient is being bridged back to his VKA/warfarin to desired targeted INR. Pharmacy note by Pharmacist has been reviewed as well. Will continue to monitor the patient's condition and discontinue LMWH upon achieving a therapeutic INR.

## 2020-01-22 NOTE — Progress Notes (Addendum)
Subjective:  ON events: None   Mr. Dennis Bates was evaluated at bedside. He has a headache this morning, but attributes this to not having caffeine. He is still endorsing some shortness of breath. He has not had any further BMs. Discussed plan for capsule endoscopy today to try to find source of GI bleed. Patient is amendable to the plan. He hopes that his capsule endoscopy will provide answers.    Objective:  Vital signs in last 24 hours: Vitals:   01/21/20 0920 01/21/20 1616 01/21/20 2027 01/22/20 0414  BP: 120/68 113/77 111/78 107/74  Pulse: 67 75 71 68  Resp: 13 18 18 18   Temp:   99.1 F (37.3 C) 98.5 F (36.9 C)  TempSrc:   Oral Oral  SpO2: 93% 98% 100% 100%  Weight:   77.9 kg   Height:       Physical Exam Vitals reviewed.  Constitutional:      Appearance: He is well-developed.  HENT:     Head: Normocephalic and atraumatic.  Cardiovascular:     Rate and Rhythm: Normal rate and regular rhythm.     Heart sounds: No murmur. No friction rub. No gallop.   Pulmonary:     Effort: Pulmonary effort is normal.     Breath sounds: Normal breath sounds. No decreased breath sounds, wheezing, rhonchi or rales.  Abdominal:     General: Bowel sounds are normal.     Palpations: Abdomen is soft.     Tenderness: There is no abdominal tenderness. There is no guarding.  Skin:    General: Skin is warm and dry.  Neurological:     Mental Status: He is alert and oriented to person, place, and time.  Psychiatric:        Mood and Affect: Mood normal.        Behavior: Behavior normal.     CBC Latest Ref Rng & Units 01/22/2020 01/21/2020 01/20/2020  WBC 4.0 - 10.5 K/uL 5.2 5.3 5.1  Hemoglobin 13.0 - 17.0 g/dL 03/21/2020) 8.9(L) 9.2(L)  Hematocrit 39.0 - 52.0 % 27.9(L) 28.7(L) 29.3(L)  Platelets 150 - 400 K/uL 321 333 346    BMP Latest Ref Rng & Units 01/22/2020 01/21/2020 01/20/2020  Glucose 70 - 99 mg/dL 03/21/2020) 536(R) 443(X)  BUN 6 - 20 mg/dL 540(G) 6 8  Creatinine <8(Q - 1.24 mg/dL 7.61 9.50 9.32   Sodium 135 - 145 mmol/L 140 141 140  Potassium 3.5 - 5.1 mmol/L 4.2 3.9 4.2  Chloride 98 - 111 mmol/L 110 109 107  CO2 22 - 32 mmol/L 22 21(L) 24  Calcium 8.9 - 10.3 mg/dL 9.2 9.4 9.2   CTA Abdomen pelvis W or WO Contrast IMPRESSION: 01/21/2020 1. Minimal to moderate amount of atherosclerotic plaque within a normal caliber abdominal aorta, not resulting in a hemodynamically significant stenosis. Aortic Atherosclerosis (ICD10-I70.0). 2. No discrete areas of bowel wall thickening or intraluminal contrast extravasation to suggest GI bleeding. 3. Cholelithiasis without evidence of cholecystitis.  Assessment/Plan:  Active Problems:   Gastrointestinal hemorrhage   Symptomatic anemia   Melena   Iron deficiency anemia   Chronic anticoagulation  Mr. Dennis Bates is a 59 year old male, with a PMHx of HTN, HLD, L sided Bells palsy, CAD, MI (sp circumflex-OM2, bicuspid valve sp aortic valve replacement), who presented for [facility] and admitted for symptomatic anemia.   Symptomatic Iron Deficiency Anemia: Likely GI based on history of progressive shortness of breath, dark stools. He has remained HDS. S/P 3 PRBC transfusions. Additionally, S/P [facility]  on 01/19/2020. - We appreciate GI's recommendations.  - EGD and colonoscopy showed no active pathology. CTA shows moderate aortic atherosclerosis, no discrete areas of bowel wall thickening or intraluminal contrast extravasation. Likely to undergo capsule endoscopy today. - Hgb: 8.6.  - Transfusion goal is hgb > 8 given CAD Hx.   Type 2 Diabetes: Patient with Hgb A1c of 5.9% presented to [facility]. Controlled on Metformin 1000 mg BID. - Controlled on SSI  History of MI with Prosthetic Valve:  - Holding ASA - Start Warfarin with Lovenox bridge   Prior to Admission Living Arrangement: home Anticipated Discharge Location: home Barriers to Discharge: continued medical work-up  Dispo: Anticipated discharge in approximately 2-3 day(s).   Maudie Mercury,  MD 01/22/2020, 6:01 AM Pager: [phone]

## 2020-01-22 NOTE — Progress Notes (Signed)
ANTICOAGULATION CONSULT NOTE - Initial Consult  Pharmacy Consult for Warfarin , with  Lovenox bridge Indication:  mechanical AVR  No Known Allergies  Patient Measurements: Height: 5\' 8"  (172.7 cm) Weight: 77.9 kg (171 lb 12.8 oz) IBW/kg (Calculated) : 68.4 kg  Vital Signs: Temp: 98.2 F (36.8 C) (05/11 0917) Temp Source: Oral (05/11 0917) BP: 114/85 (05/11 0917) Pulse Rate: 68 (05/11 0917)  Labs: Recent Labs    01/20/20 0315 01/20/20 0315 01/20/20 1600 01/20/20 1600 01/21/20 0352 01/22/20 0321  HGB 8.1*   < > 9.2*   < > 8.9* 8.6*  HCT 25.8*   < > 29.3*  --  28.7* 27.9*  PLT 278   < > 346  --  333 321  LABPROT 23.8*  --   --   --  19.1* 17.1*  INR 2.2*  --   --   --  1.7* 1.5*  CREATININE 0.99  --   --   --  0.95 0.97   < > = values in this interval not displayed.    Estimated Creatinine Clearance: 80.3 mL/min (by C-G formula based on SCr of 0.97 mg/dL).   Medical History: Past Medical History:  Diagnosis Date  . Aortic stenosis due to bicuspid aortic valve    Severe aortic stenosis of bicuspid valve - s/pAVR with Bentall ( 09/2015)  . Bell's palsy    LAST EPISODE 09-2015  . CAD S/P percutaneous coronary angioplasty 09/20/2014   a. inferolat STEMI ->> LHC-Angio: Proximal/ostial LAD 20%, OM2 99% -->> PCI: 91mm x 16 mm Promus Premier DES to the OM2.(~3.5 mm)  . Diabetes mellitus type 2, controlled, with complications (HCC)   . Hyperlipidemia   . Hypertension   . Hypertriglyceridemia   . ST elevation myocardial infarction (STEMI) of inferior wall (HCC) 09/20/2014   99 % occluded Cx-OM2 Promus DES 3.0 mm x 16 mm (3.5 mm)    Medications:  Medications Prior to Admission  Medication Sig Dispense Refill Last Dose  . acetaminophen (TYLENOL) 325 MG tablet Take 2 tablets (650 mg total) by mouth every 6 (six) hours as needed for mild pain.   01/18/2020 at Unknown time  . ASPIRIN 81 PO Take 1 tablet by mouth daily.    01/18/2020 at Unknown time  . carvedilol (COREG) 3.125 MG  tablet Take 1 tablet (3.125 mg total) by mouth 2 (two) times daily. 60 tablet 3 01/18/2020 at 0600  . gemfibrozil (LOPID) 600 MG tablet Take 600 mg by mouth in the morning and at bedtime.   01/18/2020 at Unknown time  . metFORMIN (GLUCOPHAGE) 1000 MG tablet Take 1,000 mg by mouth 2 (two) times daily with a meal.    01/18/2020 at Unknown time  . rosuvastatin (CRESTOR) 20 MG tablet Take 20 mg by mouth at bedtime.   01/17/2020 at Unknown time  . warfarin (COUMADIN) 10 MG tablet Take 10 mg by mouth daily.   01/18/2020 at 0600    Assessment: 59 y.o male on Coumadin 10mg  daily PTA for mechanical AVR.  Patient checks INRs at home.  Per pt, last was 2.3 on 5/6.  Goal INR 2-3. Coumadin held due to upper GI bleeding, symptomatic anemia. S/P 3 PRBC transfusions. Additionally, S/P Feraheme on 01/19/2020  S/p EGD, colonoscopy 5/10 done for Fe defic anemia.  Findings: incidental non-bleeding duodenal diverticulum, no biopsies taken since INR elevated.  Colonscopy: mild sigmoid diverticulosis, no active LGI bleeding, non-bleeding internal small hemorrhoids.   Pharmacy consulted 5/10to start Lovenox bridge for mechanical AVR.  Today 5/11,  Pharmacy consulted to resume warfarin.  PTA warfarin dose was 10 mg daily .  INR 2.8 on admit with anemia, Hgb = 5.0 on admit.  Today 5/11, the INR is 1.5.  Hgb 8.6 stable, pltc 321 stable.   Goal of Therapy:  Monitor platelets by anticoagulation protocol: Yes   Plan:  Give  Warfarin 7.5 mg x1 (lower than PTA dose , caution due to recent GIB, anemia) Continue Lovenox 80 mg SQ q12hours (1mg /kg q12h) Monitor renal function and for signs and symptoms of bleeding.  Daily INR   Thank you for allowing pharmacy to be part of this patients care team.  Nicole Cella, [place] Pharmacist [phone] Please check AMION for all [place] phone numbers After 10:00 PM, call [place] [phone] 01/22/2020,9:21 AM

## 2020-01-22 NOTE — Plan of Care (Signed)
  Problem: Education: Goal: Knowledge of General Education information will improve Description Including pain rating scale, medication(s)/side effects and non-pharmacologic comfort measures Outcome: Progressing   Problem: Health Behavior/Discharge Planning: Goal: Ability to manage health-related needs will improve Outcome: Progressing   

## 2020-01-23 ENCOUNTER — Inpatient Hospital Stay (HOSPITAL_COMMUNITY): Payer: BC Managed Care – PPO

## 2020-01-23 DIAGNOSIS — D62 Acute posthemorrhagic anemia: Secondary | ICD-10-CM

## 2020-01-23 LAB — CBC
HCT: 28.7 % — ABNORMAL LOW (ref 39.0–52.0)
Hemoglobin: 8.8 g/dL — ABNORMAL LOW (ref 13.0–17.0)
MCH: 26.8 pg (ref 26.0–34.0)
MCHC: 30.7 g/dL (ref 30.0–36.0)
MCV: 87.5 fL (ref 80.0–100.0)
Platelets: 293 10*3/uL (ref 150–400)
RBC: 3.28 MIL/uL — ABNORMAL LOW (ref 4.22–5.81)
RDW: 16.4 % — ABNORMAL HIGH (ref 11.5–15.5)
WBC: 4.5 10*3/uL (ref 4.0–10.5)
nRBC: 0 % (ref 0.0–0.2)

## 2020-01-23 LAB — GLUCOSE, CAPILLARY
Glucose-Capillary: 168 mg/dL — ABNORMAL HIGH (ref 70–99)
Glucose-Capillary: 171 mg/dL — ABNORMAL HIGH (ref 70–99)
Glucose-Capillary: 176 mg/dL — ABNORMAL HIGH (ref 70–99)
Glucose-Capillary: 253 mg/dL — ABNORMAL HIGH (ref 70–99)
Glucose-Capillary: 90 mg/dL (ref 70–99)
Glucose-Capillary: 95 mg/dL (ref 70–99)

## 2020-01-23 LAB — PROTIME-INR
INR: 1.2 (ref 0.8–1.2)
Prothrombin Time: 14.7 seconds (ref 11.4–15.2)

## 2020-01-23 MED ORDER — GADOBUTROL 1 MMOL/ML IV SOLN
7.8000 mL | Freq: Once | INTRAVENOUS | Status: AC | PRN
  Administered 2020-01-23: 7.8 mL via INTRAVENOUS

## 2020-01-23 MED ORDER — WARFARIN - PHARMACIST DOSING INPATIENT: Freq: Every day | Status: DC

## 2020-01-23 NOTE — Progress Notes (Signed)
ANTICOAGULATION CONSULT NOTE - follow up  Pharmacy Consult for Warfarin , with  Lovenox bridge Indication:  mechanical AVR  No Known Allergies  Patient Measurements: Height: 5\' 8"  (172.7 cm) Weight: 77.9 kg (171 lb 12.8 oz) IBW/kg (Calculated) : 68.4 kg  Vital Signs: Temp: 98 F (36.7 C) (05/12 0410) Temp Source: Oral (05/12 0410) BP: 106/64 (05/12 0410) Pulse Rate: 62 (05/12 0410)  Labs: Recent Labs    01/21/20 0352 01/21/20 0352 01/22/20 0321 01/22/20 1405 01/23/20 0603  HGB 8.9*   < > 8.6*  --  8.8*  HCT 28.7*  --  27.9*  --  28.7*  PLT 333  --  321  --  293  LABPROT 19.1*  --  17.1*  --  14.7  INR 1.7*  --  1.5*  --  1.2  HEPRLOWMOCWT  --   --   --  1.22  --   CREATININE 0.95  --  0.97  --   --    < > = values in this interval not displayed.    Estimated Creatinine Clearance: 80.3 mL/min (by C-G formula based on SCr of 0.97 mg/dL).   Medical History: Past Medical History:  Diagnosis Date  . Aortic stenosis due to bicuspid aortic valve    Severe aortic stenosis of bicuspid valve - s/pAVR with Bentall ( 09/2015)  . Bell's palsy    LAST EPISODE 09-2015  . CAD S/P percutaneous coronary angioplasty 09/20/2014   a. inferolat STEMI ->> LHC-Angio: Proximal/ostial LAD 20%, OM2 99% -->> PCI: 62mm x 16 mm Promus Premier DES to the OM2.(~3.5 mm)  . Diabetes mellitus type 2, controlled, with complications (HCC)   . Hyperlipidemia   . Hypertension   . Hypertriglyceridemia   . ST elevation myocardial infarction (STEMI) of inferior wall (HCC) 09/20/2014   99 % occluded Cx-OM2 Promus DES 3.0 mm x 16 mm (3.5 mm)    Medications:  Medications Prior to Admission  Medication Sig Dispense Refill Last Dose  . acetaminophen (TYLENOL) 325 MG tablet Take 2 tablets (650 mg total) by mouth every 6 (six) hours as needed for mild pain.   01/18/2020 at Unknown time  . ASPIRIN 81 PO Take 1 tablet by mouth daily.    01/18/2020 at Unknown time  . carvedilol (COREG) 3.125 MG tablet Take 1  tablet (3.125 mg total) by mouth 2 (two) times daily. 60 tablet 3 01/18/2020 at 0600  . gemfibrozil (LOPID) 600 MG tablet Take 600 mg by mouth in the morning and at bedtime.   01/18/2020 at Unknown time  . metFORMIN (GLUCOPHAGE) 1000 MG tablet Take 1,000 mg by mouth 2 (two) times daily with a meal.    01/18/2020 at Unknown time  . rosuvastatin (CRESTOR) 20 MG tablet Take 20 mg by mouth at bedtime.   01/17/2020 at Unknown time  . warfarin (COUMADIN) 10 MG tablet Take 10 mg by mouth daily.   01/18/2020 at 0600    Assessment: 59 y.o male on Coumadin 10mg  daily PTA for mechanical AVR.  Patient checks INRs at home.  Per pt, last was 2.3 on 5/6.  Goal INR 2-3. Coumadin held due to upper GI bleeding, symptomatic anemia. S/P 3 PRBC transfusions. Additionally, S/P Feraheme on 01/19/2020 PTA warfarin dose was 10 mg daily with therapeutic INR 2.8 on admit with anemia, Hgb = 5.0 on admit.   S/p EGD, colonoscopy 5/10 done for Fe defic anemia.  Findings: incidental non-bleeding duodenal diverticulum, no biopsies taken since INR elevated.  Colonscopy: mild sigmoid  diverticulosis, no active LGI bleeding, non-bleeding internal small hemorrhoids.   Pharmacy consulted 5/10 to start Lovenox bridge for mechanical AVR. Pharmacy consulted to resume warfarin on 5/11 s/p EGD, colonoscopy.  INR 1.2 today.  Hgb 8.8 stable, pltc wnl stable.   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  GI plans enteroscopy on 5/13 AM,  Pharmacy asked by GI to hold enoxaparin and warfarin.    Goal of Therapy:  INR 2-3 Monitor platelets by anticoagulation protocol: Yes   Plan:  Hold warfarin today and hold Lovenox dose tonight 5/12 as plan is for enteroscopy on 5/13.  Will follow up 5/13 post procedure as to when to restart anticoagulation Daily INR   Thank you for allowing pharmacy to be part of this patients care team.  Nicole Cella, [place]  Pharmacist [phone] Please check AMION for all [place] phone numbers After 10:00 PM, call [place] [phone] 01/23/2020,12:35 PM

## 2020-01-23 NOTE — Progress Notes (Addendum)
Patient ID: Dennis Bates, male   DOB: Feb 15, 1961, 59 y.o.   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

## 2020-01-23 NOTE — Progress Notes (Signed)
VCE in progress for unexplained GI bleeding (melena, FOBT +) and IDA.  Chronic coumadin post AVR.      Stool yesterday was brown, loose and capsule passed.  Feels well,  No SOB or dizziness.    EGD: non-bleeding duodenal tic Colonoscopy: mild sigmoid tics, no bleeding.     Pt looks well.  Not re-examined but no resp distress  Hgb 5 >> 3PRBCs >>  8.8  Scheduled Meds: . enoxaparin (LOVENOX) injection  1 mg/kg Subcutaneous Q12H  . insulin aspart  0-9 Units Subcutaneous Q4H  . pantoprazole (PROTONIX) IV  40 mg Intravenous Q12H  . sodium chloride flush  3 mL Intravenous Q12H  . Warfarin - Pharmacist Dosing Inpatient   Does not apply q1600   Continuous Infusions: PRN Meds:.acetaminophen **OR** acetaminophen, ondansetron **OR** ondansetron (ZOFRAN) IV  Await reading of capsule.

## 2020-01-23 NOTE — Progress Notes (Addendum)
Subjective:  ON events: None   Dennis Bates was seen and evaluated at bedside this morning. He passed the capsule in a BM yesterday evening. He is feeling well.   Mentions that the vision in his left eye goes black for a couple of minutes and then returns. This has been happening intermittently for several months. Describes a curtain being pulled over. He had a headache most of yesterday, but attributes that to little food and caffeine intake. Vision is normal this morning.   No history of stroke. No weakness, numbness/tingling.   Discussed need to image to further evaluate intermittent vision loss, but this will be difficult due to mechanical heart valve.    Objective:  Vital signs in last 24 hours: Vitals:   01/22/20 0917 01/22/20 1635 01/22/20 2120 01/23/20 0410  BP: 114/85 107/76 116/73 106/64  Pulse: 68 70 68 62  Resp: 18 18 18 18   Temp: 98.2 F (36.8 C) 98.4 F (36.9 C) 97.6 F (36.4 C) 98 F (36.7 C)  TempSrc: Oral Oral Oral Oral  SpO2: 100% 100% 100% 98%  Weight:      Height:       Physical Exam Constitutional:      General: He is not in acute distress.    Appearance: He is not ill-appearing.  HENT:     Head: Normocephalic and atraumatic.  Cardiovascular:     Rate and Rhythm: Normal rate and regular rhythm.     Heart sounds: No murmur. No friction rub. No gallop.   Pulmonary:     Breath sounds: No decreased breath sounds, wheezing, rhonchi or rales.  Abdominal:     General: Bowel sounds are normal.     Palpations: Abdomen is soft.     Tenderness: There is no abdominal tenderness.  Neurological:     Mental Status: He is alert and oriented to person, place, and time.  Psychiatric:        Mood and Affect: Mood normal.        Behavior: Behavior normal.     CBC Latest Ref Rng & Units 01/23/2020 01/22/2020 01/21/2020  WBC 4.0 - 10.5 K/uL 4.5 5.2 5.3  Hemoglobin 13.0 - 17.0 g/dL 03/22/2020) 8.4(X) 8.9(L)  Hematocrit 39.0 - 52.0 % 28.7(L) 27.9(L) 28.7(L)  Platelets  150 - 400 K/uL 293 321 333    BMP Latest Ref Rng & Units 01/22/2020 01/21/2020 01/20/2020  Glucose 70 - 99 mg/dL 03/21/2020) 401(U) 272(Z)  BUN 6 - 20 mg/dL 366(Y) 6 8  Creatinine <4(I - 1.24 mg/dL 3.47 4.25 9.56  Sodium 135 - 145 mmol/L 140 141 140  Potassium 3.5 - 5.1 mmol/L 4.2 3.9 4.2  Chloride 98 - 111 mmol/L 110 109 107  CO2 22 - 32 mmol/L 22 21(L) 24  Calcium 8.9 - 10.3 mg/dL 9.2 9.4 9.2     Assessment/Plan:  Active Problems:   Gastrointestinal hemorrhage   Symptomatic anemia   Melena   Iron deficiency anemia   Chronic anticoagulation  Dennis Bates is a 59 year old male, with a PMHx of HTN, HLD, L sided Bells palsy, CAD, MI (sp circumflex-OM2, bicuspid valve sp aortic valve replacement), who presented for [facility] and admitted for symptomatic anemia.   Symptomatic Iron Deficiency Anemia: Likely GI based on history of progressive shortness of breath, dark stools. He has remained HDS. S/P 3 PRBC transfusions. Additionally, S/P Faraheme on 01/19/2020. CTA was unrevealing for bleeds. Pill study performed yesterday.  - We appreciate GI's recommendations.   - AVM noted  in duodenal bulb, otherwise negative study. NPO at midnight, hold Lovenox, hold coumadin, enteroscopy 01/24/20.  - Hgb: 8.8.  - Transfusion goal is hgb > 8 given CAD Hx.   Type 2 Diabetes: Patient with Hgb A1c of 5.9% presented to [facility]. Controlled on Metformin 1000 mg BID. - Controlled on SSI  History of MI with Prosthetic Valve:  - Holding ASA - Start Warfarin with Lovenox bridge  - INR: 1.2  Amaurosis Fugax: Patient explains that he had loss of vision in his left eye last night. He describes the loss of vision as a "curtain or a film" going over his eye. He states that his symptoms lasted several minutes before alleviating. He has had several of these episodes in the past, which he attributed to his diabetes. Primary team discusses possible etiologies for his vision loss, including a TIA. We kindly asked that if symptoms  return, to immediately let his nurse know, and for IMTS to be paged.  - MRA Neck w/wo contrast - MRA Angio Head wo contrast   Prior to Admission Living Arrangement: Home Anticipated Discharge Location: Home Barriers to Discharge: Continued medical work-up  Dispo: Anticipated discharge in approximately 2-3 day(s).   Maudie Mercury, MD 01/23/2020, 7:25 AM Pager: [phone]

## 2020-01-23 NOTE — Plan of Care (Signed)
  Problem: Education: Goal: Knowledge of General Education information will improve Description: Including pain rating scale, medication(s)/side effects and non-pharmacologic comfort measures Outcome: Progressing   Problem: Clinical Measurements: Goal: Ability to maintain clinical measurements within normal limits will improve Outcome: Progressing   

## 2020-01-23 NOTE — Care Management (Signed)
Per Meredeth Ide W/The Pharmacy Help Desk  Co-pay amount for Enoxaparin 80 mg,twice a day $30.00.  No PA required Deductible Met Tier 1 Retail Pharmacy:  [facility]

## 2020-01-24 ENCOUNTER — Encounter (HOSPITAL_COMMUNITY)
Admission: EM | Disposition: A | Discharge status: Home / Self Care | Payer: Self-pay | Source: Home / Self Care | Attending: Internal Medicine

## 2020-01-24 ENCOUNTER — Inpatient Hospital Stay (HOSPITAL_COMMUNITY): Payer: BC Managed Care – PPO | Admitting: Anesthesiology

## 2020-01-24 DIAGNOSIS — K922 Gastrointestinal hemorrhage, unspecified: Secondary | ICD-10-CM

## 2020-01-24 HISTORY — PX: ENTEROSCOPY: SHX5533

## 2020-01-24 LAB — CBC
HCT: 28.4 % — ABNORMAL LOW (ref 39.0–52.0)
Hemoglobin: 8.7 g/dL — ABNORMAL LOW (ref 13.0–17.0)
MCH: 27 pg (ref 26.0–34.0)
MCHC: 30.6 g/dL (ref 30.0–36.0)
MCV: 88.2 fL (ref 80.0–100.0)
Platelets: 285 10*3/uL (ref 150–400)
RBC: 3.22 MIL/uL — ABNORMAL LOW (ref 4.22–5.81)
RDW: 16.2 % — ABNORMAL HIGH (ref 11.5–15.5)
WBC: 3.7 10*3/uL — ABNORMAL LOW (ref 4.0–10.5)
nRBC: 0 % (ref 0.0–0.2)

## 2020-01-24 LAB — GLUCOSE, CAPILLARY
Glucose-Capillary: 105 mg/dL — ABNORMAL HIGH (ref 70–99)
Glucose-Capillary: 108 mg/dL — ABNORMAL HIGH (ref 70–99)
Glucose-Capillary: 102 mg/dL — ABNORMAL HIGH (ref 70–99)
Glucose-Capillary: 202 mg/dL — ABNORMAL HIGH (ref 70–99)
Glucose-Capillary: 189 mg/dL — ABNORMAL HIGH (ref 70–99)

## 2020-01-24 LAB — BASIC METABOLIC PANEL
Anion gap: 9 (ref 5–15)
BUN: 5 mg/dL — ABNORMAL LOW (ref 6–20)
CO2: 25 mmol/L (ref 22–32)
Calcium: 9.2 mg/dL (ref 8.9–10.3)
Chloride: 108 mmol/L (ref 98–111)
Creatinine, Ser: 1.01 mg/dL (ref 0.61–1.24)
GFR calc Af Amer: >60 mL/min (ref >60)
GFR calc non Af Amer: >60 mL/min (ref >60)
Glucose, Bld: 117 mg/dL — ABNORMAL HIGH (ref 70–99)
Potassium: 4.4 mmol/L (ref 3.5–5.1)
Sodium: 142 mmol/L (ref 135–145)

## 2020-01-24 LAB — PROTIME-INR
INR: 1.1 (ref 0.8–1.2)
Prothrombin Time: 14 seconds (ref 11.4–15.2)

## 2020-01-24 SURGERY — ENTEROSCOPY
Anesthesia: Monitor Anesthesia Care

## 2020-01-24 MED ORDER — SODIUM CHLORIDE 0.9 % IV SOLN: 25.0000 mg | Freq: Once | INTRAVENOUS | Status: DC

## 2020-01-24 MED ORDER — PROPOFOL 500 MG/50ML IV EMUL
INTRAVENOUS | Status: DC | PRN
  Administered 2020-01-24: 100 ug/kg/min via INTRAVENOUS

## 2020-01-24 MED ORDER — ENOXAPARIN SODIUM 80 MG/0.8ML ~~LOC~~ SOLN
80.0000 mg | Freq: Two times a day (BID) | SUBCUTANEOUS | Status: DC
  Administered 2020-01-24 – 2020-01-25 (×3): 80 mg via SUBCUTANEOUS
  Filled 2020-01-24 (×3): qty 0.8

## 2020-01-24 MED ORDER — PROMETHAZINE HCL 25 MG/ML IJ SOLN: 6.2500 mg | INTRAMUSCULAR | Status: DC | PRN

## 2020-01-24 MED ORDER — SODIUM CHLORIDE 0.9 % IV SOLN
125.0000 mg | Freq: Once | INTRAVENOUS | Status: AC
  Administered 2020-01-24: 125 mg via INTRAVENOUS
  Filled 2020-01-24: qty 10

## 2020-01-24 MED ORDER — FENTANYL CITRATE (PF) 100 MCG/2ML IJ SOLN
INTRAMUSCULAR | Status: DC | PRN
  Administered 2020-01-24: 25 ug via INTRAVENOUS

## 2020-01-24 MED ORDER — LACTATED RINGERS IV SOLN: INTRAVENOUS | Status: DC | PRN

## 2020-01-24 MED ORDER — HEPARIN (PORCINE) 25000 UT/250ML-% IV SOLN: 1100.0000 [IU]/h | INTRAVENOUS | Status: DC

## 2020-01-24 MED ORDER — ENSURE ENLIVE PO LIQD
237.0000 mL | Freq: Three times a day (TID) | ORAL | Status: DC
  Administered 2020-01-24 – 2020-01-25 (×3): 237 mL via ORAL

## 2020-01-24 MED ORDER — WARFARIN SODIUM 10 MG PO TABS
10.0000 mg | ORAL_TABLET | Freq: Once | ORAL | Status: AC
  Administered 2020-01-24: 10 mg via ORAL
  Filled 2020-01-24: qty 1

## 2020-01-24 MED ORDER — LACTATED RINGERS IV SOLN
INTRAVENOUS | Status: DC
  Administered 2020-01-24: 1000 mL via INTRAVENOUS

## 2020-01-24 MED ORDER — INSULIN ASPART 100 UNIT/ML ~~LOC~~ SOLN
0.0000 [IU] | Freq: Three times a day (TID) | SUBCUTANEOUS | Status: DC
  Administered 2020-01-25: 1 [IU] via SUBCUTANEOUS

## 2020-01-24 NOTE — Progress Notes (Signed)
Subjective:  ON events: None  Mr. Platz was seen at bedside this PM. He states that he is doing well today. He states that he tolerated his procedure well. There were no interventions at this time, but if he were to develop tarry stools, he was instructed to come in for pill endoscopy then enteroscopy. Patient states that he understands, and agreed to [facility] plan. He denies nausea, vomiting, abdominal pain, headaches, or symptoms concerning for amaurosis fugax. Instructed patient that if symptoms were to arise again immediately let his nurse know.    Objective:  Vital signs in last 24 hours: Vitals:   01/23/20 1019 01/23/20 1635 01/23/20 2208 01/24/20 0431  BP: (!) 136/91 117/77 121/74 117/70  Pulse: 85 72 73 68  Resp: 18 18  18   Temp: 98.4 F (36.9 C) 97.9 F (36.6 C) 98.6 F (37 C) 97.8 F (36.6 C)  TempSrc:   Oral Oral  SpO2: 100% 100% 100% 98%  Weight:      Height:       Physical Exam Vitals reviewed.  Constitutional:      General: He is not in acute distress.    Appearance: He is normal weight. He is not ill-appearing or toxic-appearing.  HENT:     Head: Normocephalic and atraumatic.  Cardiovascular:     Rate and Rhythm: Normal rate and regular rhythm.     Heart sounds: No murmur. No friction rub.  Pulmonary:     Effort: Pulmonary effort is normal.     Breath sounds: Normal breath sounds. No decreased breath sounds, wheezing or rales.  Abdominal:     General: Bowel sounds are normal.     Palpations: Abdomen is soft.     Tenderness: There is no abdominal tenderness. There is no guarding.  Neurological:     Mental Status: He is alert.     CBC Latest Ref Rng & Units 01/23/2020 01/22/2020 01/21/2020  WBC 4.0 - 10.5 K/uL 4.5 5.2 5.3  Hemoglobin 13.0 - 17.0 g/dL 8.8(L) 8.6(L) 8.9(L)  Hematocrit 39.0 - 52.0 % 28.7(L) 27.9(L) 28.7(L)  Platelets 150 - 400 K/uL 293 321 333    BMP Latest Ref Rng & Units 01/22/2020 01/21/2020 01/20/2020  Glucose 70 - 99 mg/dL 105(H) 142(H)  118(H)  BUN 6 - 20 mg/dL <5(L) 6 8  Creatinine 0.61 - 1.24 mg/dL 0.97 0.95 0.99  Sodium 135 - 145 mmol/L 140 141 140  Potassium 3.5 - 5.1 mmol/L 4.2 3.9 4.2  Chloride 98 - 111 mmol/L 110 109 107  CO2 22 - 32 mmol/L 22 21(L) 24  Calcium 8.9 - 10.3 mg/dL 9.2 9.4 9.2   MRA Neck:  IMPRESSION: The bilateral common and internal carotid arteries are patent within the neck without significant stenosis.  The vertebral arteries are patent within the neck bilaterally with antegrade flow. Apparent moderate stenoses at the origins of both vertebral arteries.  MR Angio Head IMPRESSION: 1. No intracranial large vessel occlusion or proximal high-grade arterial stenosis. 2. Mild focal stenosis versus artifact within the distal P2 right posterior cerebral artery. Assessment/Plan:  Active Problems:   Gastrointestinal hemorrhage   Symptomatic anemia   Melena   Iron deficiency anemia   Chronic anticoagulation  Mr. Douthat is a 59 year old male, with a PMHx of HTN, HLD, L sided Bells palsy, CAD, MI (sp circumflex-OM2, bicuspid valve sp aortic valve replacement), who presented for [facility] and admitted for symptomatic anemia.   Symptomatic Iron Deficiency Anemia: Likely GI based on history of progressive  shortness of breath, dark stools. He has remained HDS. S/P 3 PRBC transfusions. Additionally, S/P Faraheme on 01/19/2020. CTA was unrevealing for bleeds. Pill study performed yesterday with findings of a small AVM in the duodenal bulb.  - We appreciate GI's recommendations.   - Enteroscopy today  - Hgb: 8.7.  - Transfusion goal is hgb > 8 given CAD Hx.   Type 2 Diabetes: Patient with Hgb A1c of 5.9% presented to [facility]. Controlled on Metformin 1000 mg BID. - Controlled on SSI  History of MI with Prosthetic Valve:  - Holding ASA - Warfarin with Lovenox bridge  - INR: 1.1  Amaurosis Fugax: Patient explains that he had loss of vision in his left eye last night. He describes the loss of vision as a  "curtain or a film" going over his eye. He states that his symptoms lasted several minutes before alleviating. He has had several of these episodes in the past, which he attributed to his diabetes. Primary team discusses possible etiologies for his vision loss, including a TIA. We kindly asked that if symptoms return, to immediately let his nurse know, and for IMTS to be paged.  - MRA Neck w/wo contrast: Moderate stenosis at the base of the vertebral arteries.  - MRA Angio Head wo contrast: Mild focal stenosis versus artifact within the distal P2 right posterior cerebral artery.   Prior to Admission Living Arrangement: Home Anticipated Discharge Location: Home Barriers to Discharge: Continued medical work-up  Dispo: Anticipated discharge in approximately 2-3 day(s).   Dolan Amen, MD 01/24/2020, 6:06 AM Pager: [phone]

## 2020-01-24 NOTE — Op Note (Addendum)
[facility] Patient Name: Dennis Bates Procedure Date : 01/24/2020 MRN: [id number] Attending MD: Estill Cotta. Loletha Carrow , MD Date of Birth: 11/17/1960 CSN: [id number] Age: 59 Admit Type: Inpatient Procedure:                Small bowel enteroscopy Indications:              Acute post hemorrhagic anemia, Melena, GI bleeding                            source not documented by previous EGD and                            colonoscopy (01/21/20). VCE (01/22/20) showed                            possible small non-bleeding AVM in duodenal bulb                           No recurrence of melena on Lovenox last 3 days Providers:                Mallie Mussel L. Loletha Carrow, MD, Benetta Spar RN, RN, Elspeth Cho Tech., Technician, Eligha Bridegroom CRNA Referring MD:             Medical Teaching Service Medicines:                Monitored Anesthesia Care Complications:            No immediate complications. Estimated Blood Loss:     Estimated blood loss: none. Procedure:                Pre-Anesthesia Assessment:                           - Prior to the procedure, a History and Physical                            was performed, and patient medications and                            allergies were reviewed. The patient's tolerance of                            previous anesthesia was also reviewed. The risks                            and benefits of the procedure and the sedation                            options and risks were discussed with the patient.                            All questions were answered, and informed consent  was obtained. Prior Anticoagulants: The patient has                            taken Lovenox (enoxaparin), last dose was 1 day                            prior to procedure. ASA Grade Assessment: III - A                            patient with severe systemic disease. After                            reviewing the risks and  benefits, the patient was                            deemed in satisfactory condition to undergo the                            procedure.                           After obtaining informed consent, the endoscope was                            passed under direct vision. Throughout the                            procedure, the patient's blood pressure, pulse, and                            oxygen saturations were monitored continuously. The                            [id number]-H190DL ([id number] Olympus pediatric colonscope                            was introduced through the mouth and advanced to                            the proximal jejunum. The small bowel enteroscopy                            was accomplished without difficulty. The patient                            tolerated the procedure well. Scope In: Scope Out: Findings:      The esophagus was normal.      The stomach was normal.      The examined duodenum was normal.      There was no evidence of significant pathology in the entire examined       portion of jejunum. Impression:               - Normal esophagus.                           -  Normal stomach.                           - Normal examined duodenum.                           - The examined portion of the jejunum was normal.                           - No specimens collected. Moderate Sedation:      MAC sedation used Recommendation:           - Return patient to hospital ward for ongoing care.                           - Resume lovenox and coumadin for mechanical heart                            valve.                           - Resume regular diet.                           - Monitor in hospital until tomorrow for recurrent                            bleeding.                           If none, discharge home tomorrow with close GI                            follow up (Dr. Tarri Glenn) and recheck of CBC next                            week.                           -  one dose of IV iron today Procedure Code(s):        --- Professional ---                           [phone], Small intestinal endoscopy, enteroscopy                            beyond second portion of duodenum, not including                            ileum; diagnostic, including collection of                            specimen(s) by brushing or washing, when performed                            (separate procedure) Diagnosis Code(s):        --- Professional ---  D62, Acute posthemorrhagic anemia                           K92.1, Melena (includes Hematochezia)                           K92.2, Gastrointestinal hemorrhage, unspecified CPT copyright 2019 [employer]. All rights reserved. The codes documented in this report are preliminary and upon coder review may  be revised to meet current compliance requirements. Terral Cooks L. Loletha Carrow, MD 01/24/2020 10:59:30 AM This report has been signed electronically. Number of Addenda: 0

## 2020-01-24 NOTE — Plan of Care (Signed)
  Problem: Clinical Measurements: Goal: Diagnostic test results will improve Outcome: Progressing   Problem: Elimination: Goal: Will not experience complications related to bowel motility Outcome: Progressing   

## 2020-01-24 NOTE — Anesthesia Postprocedure Evaluation (Signed)
Anesthesia Post Note  Patient: Dennis Bates  Procedure(s) Performed: ENTEROSCOPY (N/A )     Patient location during evaluation: Endoscopy Anesthesia Type: MAC Level of consciousness: awake and alert, oriented and patient cooperative Pain management: pain level controlled Vital Signs Assessment: post-procedure vital signs reviewed and stable Respiratory status: spontaneous breathing, nonlabored ventilation and respiratory function stable Cardiovascular status: blood pressure returned to baseline and stable Postop Assessment: no apparent nausea or vomiting Anesthetic complications: no    Last Vitals:  Vitals:   01/24/20 1120 01/24/20 1144  BP: 133/79 127/70  Pulse: 65 (!) 54  Resp: 16 18  Temp:  (!) 36.1 C  SpO2: 100% 100%    Last Pain:  Vitals:   01/24/20 1144  TempSrc: Oral  PainSc: 0-No pain                 Jovane Foutz,E. Juliah Scadden

## 2020-01-24 NOTE — Transfer of Care (Signed)
Immediate Anesthesia Transfer of Care Note  Patient: Dennis Bates  Procedure(s) Performed: ENTEROSCOPY (N/A )  Patient Location: Endoscopy Unit  Anesthesia Type:MAC  Level of Consciousness: awake, alert  and oriented  Airway & Oxygen Therapy: Patient Spontanous Breathing and Patient connected to nasal cannula oxygen  Post-op Assessment: Report given to RN and Post -op Vital signs reviewed and stable  Post vital signs: Reviewed and stable  Last Vitals:  Vitals Value Taken Time  BP 114/82 01/24/20 1101  Temp    Pulse 67 01/24/20 1101  Resp 13 01/24/20 1101  SpO2 100 % 01/24/20 1101  Vitals shown include unvalidated device data.  Last Pain:  Vitals:   01/24/20 0909  TempSrc: Oral  PainSc: 0-No pain         Complications: No apparent anesthesia complications

## 2020-01-24 NOTE — Progress Notes (Addendum)
ADDENDUM: Canceled IV heparin.  Changed to Lovenox bridge to warfarin for mechanical AVR   ANTICOAGULATION CONSULT NOTE - follow up  Pharmacy Consult for Heparin and Warfarin Indication:  mechanical AVR  No Known Allergies  Patient Measurements: Height: 5\' 8"  (172.7 cm) Weight: 77.9 kg (171 lb 12.8 oz) IBW/kg (Calculated) : 68.4 kg Heparin dosing weight: 77.9 kg  Vital Signs: Temp: 97 F (36.1 C) (05/13 1144) Temp Source: Oral (05/13 1144) BP: 127/70 (05/13 1144) Pulse Rate: 54 (05/13 1144)  Labs: Recent Labs    01/22/20 0321 01/22/20 0321 01/22/20 1405 01/23/20 0603 01/24/20 0536  HGB 8.6*   < >  --  8.8* 8.7*  HCT 27.9*  --   --  28.7* 28.4*  PLT 321  --   --  293 285  LABPROT 17.1*  --   --  14.7 14.0  INR 1.5*  --   --  1.2 1.1  HEPRLOWMOCWT  --   --  1.22  --   --   CREATININE 0.97  --   --   --  1.01   < > = values in this interval not displayed.    Estimated Creatinine Clearance: 77.1 mL/min (by C-G formula based on SCr of 1.01 mg/dL).   Medical History: Past Medical History:  Diagnosis Date  . Aortic stenosis due to bicuspid aortic valve    Severe aortic stenosis of bicuspid valve - s/pAVR with Bentall ( 09/2015)  . Bell's palsy    LAST EPISODE 09-2015  . CAD S/P percutaneous coronary angioplasty 09/20/2014   a. inferolat STEMI ->> LHC-Angio: Proximal/ostial LAD 20%, OM2 99% -->> PCI: 58mm x 16 mm Promus Premier DES to the OM2.(~3.5 mm)  . Diabetes mellitus type 2, controlled, with complications ([place])   . Hyperlipidemia   . Hypertension   . Hypertriglyceridemia   . ST elevation myocardial infarction (STEMI) of inferior wall ([facility]) 09/20/2014   99 % occluded Cx-OM2 Promus DES 3.0 mm x 16 mm (3.5 mm)    Medications:  Medications Prior to Admission  Medication Sig Dispense Refill Last Dose  . acetaminophen (TYLENOL) 325 MG tablet Take 2 tablets (650 mg total) by mouth every 6 (six) hours as needed for mild pain.   01/18/2020 at Unknown time  . ASPIRIN 81  PO Take 1 tablet by mouth daily.    01/18/2020 at Unknown time  . carvedilol (COREG) 3.125 MG tablet Take 1 tablet (3.125 mg total) by mouth 2 (two) times daily. 60 tablet 3 01/18/2020 at 0600  . gemfibrozil (LOPID) 600 MG tablet Take 600 mg by mouth in the morning and at bedtime.   01/18/2020 at Unknown time  . metFORMIN (GLUCOPHAGE) 1000 MG tablet Take 1,000 mg by mouth 2 (two) times daily with a meal.    01/18/2020 at Unknown time  . rosuvastatin (CRESTOR) 20 MG tablet Take 20 mg by mouth at bedtime.   01/17/2020 at Unknown time  . warfarin (COUMADIN) 10 MG tablet Take 10 mg by mouth daily.   01/18/2020 at 0600    Assessment: 59 y.o male on Coumadin 10mg  daily PTA for mechanical AVR.  Patient checks INRs at home.  Per pt, last was 2.3 on 5/6.  Goal INR 2-3. Coumadin held due to upper GI bleeding, symptomatic anemia. S/P 3 PRBC transfusions. Additionally, S/P Feraheme on 01/19/2020 PTA warfarin dose was 10 mg daily with therapeutic INR 2.8 on admit with anemia, Hgb = 5.0 on admit.   5/10 S/p EGD, colonoscopy for Fe defic  anemia.  Findings: incidental non-bleeding duodenal diverticulum, no biopsies taken since INR elevated.  Colonscopy: mild sigmoid diverticulosis, no active LGI bleeding, non-bleeding internal small hemorrhoids.   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  5/13 s/p enteroscopy:  CTA done and result pending , GI noted  suspects it will not show active SB bleeding.  GI ordered start IV heparin bridge & resume warfarin.  GI plans SB video capsule on Fri 5/14 . NPO after MN. HGb low  stable 8.7, pltc wnl,  No melena noted since Lovenox started 5/10.  Lovenox stopped 5/12 for enteroscopy.  Now to start IV heparin and resume warfarin for h/o mechanical AVR    Goal of Therapy:  INR 2-3 Monitor platelets by anticoagulation protocol: Yes   Plan:  Start Heparin drip 1100 units/hr.  (no bolus 2/2 recent GIB, post  enteroscopy today)  6 h HL; Goal HL 0.3-0.7 (target lower end of range) Daily HL, INR, CBC Warfarin 10 mg x1 today Follow up post SB capsule procedure tomorrow.  Thank you for allowing pharmacy to be part of this patients care team.  Noah Delaine, RPh Clinical Pharmacist [phone] Please check AMION for all [facility] phone numbers After 10:00 PM, call Main Pharmacy [phone] 01/24/2020,12:10 PM   ADDENDUM:  Canceled IV heparin.  Changed to Lovenox bridge to warfarin    SB capsule study was done 5/12,  No other GI procedures planned.  [facility] pharmacy resident, Fara Olden, Pharm D clarifed with GI Dr. Glynis Smiles that okay to restart his Lovenox that he was on prior to today's procdure , canceled IV heparin.   Plan:  Resume Lovenox 80mg  sq q12h Proceed with Warfarin dosing as noted above.  Daily INR,  CBC q72 hours.  , RPh Clinical Pharmacist  01/24/2020 1:38 PM

## 2020-01-24 NOTE — Anesthesia Preprocedure Evaluation (Addendum)
Anesthesia Evaluation  Patient identified by MRN, date of birth, ID band Patient awake    Reviewed: Allergy & Precautions, NPO status , Patient's Chart, lab work & pertinent test results, reviewed documented beta blocker date and time   History of Anesthesia Complications Negative for: history of anesthetic complications  Airway Mallampati: II  TM Distance: >3 FB Neck ROM: Full    Dental  (+) Missing, Dental Advisory Given   Pulmonary  01/18/2020 SARS coronavirus NEG   breath sounds clear to auscultation       Cardiovascular hypertension, Pt. on medications and Pt. on home beta blockers (-) angina+ CAD, + Past MI and + Cardiac Stents  + Valvular Problems/Murmurs (s/p AVR)  Rhythm:Regular Rate:Normal  01/02/2020 ECHO: EF 60-65%, AVR functioning well     Neuro/Psych negative neurological ROS     GI/Hepatic Neg liver ROS, GI bleed: required transfusion   Endo/Other  diabetes (glu 108), Oral Hypoglycemic Agents  Renal/GU negative Renal ROS     Musculoskeletal  (+) Arthritis ,   Abdominal   Peds  Hematology  (+) Blood dyscrasia (Hb 8.7), anemia , coumadin   Anesthesia Other Findings   Reproductive/Obstetrics                            Anesthesia Physical Anesthesia Plan  ASA: III  Anesthesia Plan: MAC   Post-op Pain Management:    Induction:   PONV Risk Score and Plan: 1 and Treatment may vary due to age or medical condition  Airway Management Planned: Natural Airway and Nasal Cannula  Additional Equipment:   Intra-op Plan:   Post-operative Plan:   Informed Consent: I have reviewed the patients History and Physical, chart, labs and discussed the procedure including the risks, benefits and alternatives for the proposed anesthesia with the patient or authorized representative who has indicated his/her understanding and acceptance.     Dental advisory given  Plan Discussed with:  CRNA and Surgeon  Anesthesia Plan Comments:        Anesthesia Quick Evaluation

## 2020-01-24 NOTE — Progress Notes (Signed)
ANTICOAGULATION CONSULT NOTE - follow up  Pharmacy Consult for Heparin and Warfarin Indication:  mechanical AVR  No Known Allergies  Patient Measurements: Height: 5\' 8"  (172.7 cm) Weight: 77.9 kg (171 lb 12.8 oz) IBW/kg (Calculated) : 68.4 kg Heparin dosing weight: 77.9 kg  Vital Signs: Temp: 97 F (36.1 C) (05/13 1144) Temp Source: Oral (05/13 1144) BP: 127/70 (05/13 1144) Pulse Rate: 54 (05/13 1144)  Labs: Recent Labs    01/22/20 0321 01/22/20 0321 01/22/20 1405 01/23/20 0603 01/24/20 0536  HGB 8.6*   < >  --  8.8* 8.7*  HCT 27.9*  --   --  28.7* 28.4*  PLT 321  --   --  293 285  LABPROT 17.1*  --   --  14.7 14.0  INR 1.5*  --   --  1.2 1.1  HEPRLOWMOCWT  --   --  1.22  --   --   CREATININE 0.97  --   --   --  1.01   < > = values in this interval not displayed.    Estimated Creatinine Clearance: 77.1 mL/min (by C-G formula based on SCr of 1.01 mg/dL).   Medical History: Past Medical History:  Diagnosis Date  . Aortic stenosis due to bicuspid aortic valve    Severe aortic stenosis of bicuspid valve - s/pAVR with Bentall ( 09/2015)  . Bell's palsy    LAST EPISODE 09-2015  . CAD S/P percutaneous coronary angioplasty 09/20/2014   a. inferolat STEMI ->> LHC-Angio: Proximal/ostial LAD 20%, OM2 99% -->> PCI: 72mm x 16 mm Promus Premier DES to the OM2.(~3.5 mm)  . Diabetes mellitus type 2, controlled, with complications (H[facility])   . Hyperlipidemia   . Hypertension   . Hypertriglyceridemia   . ST elevation myocardial infarction (STEMI) of inferior wall ([facility]) 09/20/2014   99 % occluded Cx-OM2 Promus DES 3.0 mm x 16 mm (3.5 mm)    Medications:  Medications Prior to Admission  Medication Sig Dispense Refill Last Dose  . acetaminophen (TYLENOL) 325 MG tablet Take 2 tablets (650 mg total) by mouth every 6 (six) hours as needed for mild pain.   01/18/2020 at Unknown time  . ASPIRIN 81 PO Take 1 tablet by mouth daily.    01/18/2020 at Unknown time  . carvedilol (COREG) 3.125 MG  tablet Take 1 tablet (3.125 mg total) by mouth 2 (two) times daily. 60 tablet 3 01/18/2020 at 0600  . gemfibrozil (LOPID) 600 MG tablet Take 600 mg by mouth in the morning and at bedtime.   01/18/2020 at Unknown time  . metFORMIN (GLUCOPHAGE) 1000 MG tablet Take 1,000 mg by mouth 2 (two) times daily with a meal.    01/18/2020 at Unknown time  . rosuvastatin (CRESTOR) 20 MG tablet Take 20 mg by mouth at bedtime.   01/17/2020 at Unknown time  . warfarin (COUMADIN) 10 MG tablet Take 10 mg by mouth daily.   01/18/2020 at 0600    Assessment: 59 y.o male on Coumadin 10mg  daily PTA for mechanical AVR.  Patient checks INRs at home.  Per pt, last was 2.3 on 5/6.  Goal INR 2-3. Coumadin held due to upper GI bleeding, symptomatic anemia. S/P 3 PRBC transfusions. Additionally, S/P Feraheme on 01/19/2020 PTA warfarin dose was 10 mg daily with therapeutic INR 2.8 on admit with anemia, Hgb = 5.0 on admit.   5/10 S/p EGD, colonoscopy for Fe defic anemia.  Findings: incidental non-bleeding duodenal diverticulum, no biopsies taken since INR elevated.  Colonscopy: mild  sigmoid diverticulosis, no active LGI bleeding, non-bleeding internal small hemorrhoids.   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  5/13 s/p enteroscopy:  CTA done and result pending , GI noted  suspects it will not show active SB bleeding.  GI ordered start IV heparin bridge & resume warfarin.  GI plans SB video capsule on Fri 5/14 . NPO after MN. HGb low  stable 8.7, pltc wnl,  No melena noted since Lovenox started 5/10.  Lovenox stopped 5/12 for enteroscopy.  Now to start IV heparin and resume warfarin for h/o mechanical AVR    Goal of Therapy:  Heparin level 0.3-0.7 (target lower end of range) INR 2-3 Monitor platelets by anticoagulation protocol: Yes   Plan:  Start Heparin drip 1100 units/hr.  (no bolus 2/2 recent GIB, post enteroscopy today)  6 h HL Daily HL, INR,  CBC Warfarin 10 mg x1 today Follow up post SB capsule procedure tomorrow.  Thank you for allowing pharmacy to be part of this patients care team.  ADDENDUM:  Spoke with Dr. Loletha Carrow about starting patient's Heparin and agreed it should be changed to enoxaparin 80mg  Q12hrs because patient will need to bridge with warfarin for at least 5 days. Starting patient on enoxaparin will allow a smoother transition of care when patient is discharged. Monitor patient for bleeding, CBC, and INR. Will educate patient on enoxaparin self administration before discharge.   Jarome Lamas  Student Pharmacist

## 2020-01-24 NOTE — Progress Notes (Signed)
Initial Nutrition Assessment  DOCUMENTATION CODES:   Not applicable  INTERVENTION:  Ensure Enlive po TID, each supplement provides 350 kcal and 20 grams of protein   NUTRITION DIAGNOSIS:   Unintentional weight loss related to acute illness as evidenced by percent weight loss.   GOAL:   Patient will meet greater than or equal to 90% of their needs   MONITOR:   PO intake, Weight trends, Supplement acceptance, Labs, I & O's  REASON FOR ASSESSMENT:   LOS    ASSESSMENT:   Pt with a PMH significant for HTN, HLD, left facial bell's palsy, CAD, STEMI s/p DES to circumflex-OM2, bicuspid aortic valve s/p prosthetic AVR who was admitted with symptomatic anemia and GIB.  5/10 - s/p EGD and colonoscopy 5/13 - s/p small bowel enteroscopy   Pt unavailable at time of RD visit. Discussed pt with RN.   Per wt readings, pt with a 6.4% wt loss x1 month, which is significant for time frame.   PO Intake: 100% of meals; however, pt just upgraded to regular diet.   Labs: CBGs [phone] Medications reviewed and include: Novolog, Coumadin   NUTRITION - FOCUSED PHYSICAL EXAM:  Unable to perform at this time, will attempt at follow-up.  Diet Order:   Diet Order            Diet regular Room service appropriate? Yes; Fluid consistency: Thin  Diet effective now              EDUCATION NEEDS:   No education needs have been identified at this time  Skin:  Skin Assessment: Reviewed RN Assessment  Last BM:  5/12  Height:   Ht Readings from Last 1 Encounters:  01/18/20 5\' 8"  (1.727 m)    Weight:   Wt Readings from Last 1 Encounters:  01/21/20 77.9 kg   BMI:  Body mass index is 26.12 kg/m.  Estimated Nutritional Needs:   Kcal:  1950-2150  Protein:  95-110 grams  Fluid:  >1.95L/d   03/22/20, MS, RD, LDN RD pager number and weekend/on-call pager number located in Amion.

## 2020-01-24 NOTE — Interval H&P Note (Signed)
History and Physical Interval Note:  01/24/2020 10:23 AM  Dennis Bates  has presented today for surgery, with the diagnosis of melena, anemia, AVM on capsule.  The various methods of treatment have been discussed with the patient and family. After consideration of risks, benefits and other options for treatment, the patient has consented to  Procedure(s): ENTEROSCOPY (N/A) as a surgical intervention.  The patient's history has been reviewed, patient examined, no change in status, stable for surgery.  I have reviewed the patient's chart and labs.  Questions were answered to the patient's satisfaction.    Hgb 8.7 today.  No melena since starting heparin 3 days ago.  [name]

## 2020-01-24 NOTE — Plan of Care (Signed)
  Problem: Education: Goal: Knowledge of General Education information will improve Description: Including pain rating scale, medication(s)/side effects and non-pharmacologic comfort measures Outcome: Progressing   Problem: Clinical Measurements: Goal: Ability to maintain clinical measurements within normal limits will improve Outcome: Progressing   

## 2020-01-25 DIAGNOSIS — D500 Iron deficiency anemia secondary to blood loss (chronic): Secondary | ICD-10-CM

## 2020-01-25 LAB — BASIC METABOLIC PANEL
Anion gap: 8 (ref 5–15)
BUN: 8 mg/dL (ref 6–20)
CO2: 26 mmol/L (ref 22–32)
Calcium: 9.4 mg/dL (ref 8.9–10.3)
Chloride: 104 mmol/L (ref 98–111)
Creatinine, Ser: 0.86 mg/dL (ref 0.61–1.24)
GFR calc Af Amer: >60 mL/min (ref >60)
GFR calc non Af Amer: >60 mL/min (ref >60)
Glucose, Bld: 158 mg/dL — ABNORMAL HIGH (ref 70–99)
Potassium: 4.5 mmol/L (ref 3.5–5.1)
Sodium: 138 mmol/L (ref 135–145)

## 2020-01-25 LAB — PROTIME-INR
INR: 1.1 (ref 0.8–1.2)
Prothrombin Time: 13.7 seconds (ref 11.4–15.2)

## 2020-01-25 LAB — CBC
HCT: 31.2 % — ABNORMAL LOW (ref 39.0–52.0)
Hemoglobin: 9.6 g/dL — ABNORMAL LOW (ref 13.0–17.0)
MCH: 27.4 pg (ref 26.0–34.0)
MCHC: 30.8 g/dL (ref 30.0–36.0)
MCV: 88.9 fL (ref 80.0–100.0)
Platelets: 290 10*3/uL (ref 150–400)
RBC: 3.51 MIL/uL — ABNORMAL LOW (ref 4.22–5.81)
RDW: 16.4 % — ABNORMAL HIGH (ref 11.5–15.5)
WBC: 4 10*3/uL (ref 4.0–10.5)
nRBC: 0 % (ref 0.0–0.2)

## 2020-01-25 LAB — GLUCOSE, CAPILLARY
Glucose-Capillary: 130 mg/dL — ABNORMAL HIGH (ref 70–99)
Glucose-Capillary: 196 mg/dL — ABNORMAL HIGH (ref 70–99)

## 2020-01-25 MED ORDER — WARFARIN SODIUM 7.5 MG PO TABS: 15.0000 mg | ORAL_TABLET | Freq: Once | ORAL | Status: DC

## 2020-01-25 MED ORDER — ENOXAPARIN SODIUM 80 MG/0.8ML ~~LOC~~ SOLN
80.0000 mg | Freq: Two times a day (BID) | SUBCUTANEOUS | 1 refills | Status: DC
Start: 2020-01-25 — End: 2020-01-25

## 2020-01-25 MED ORDER — WARFARIN SODIUM 2.5 MG PO TABS: 12.5000 mg | ORAL_TABLET | Freq: Once | ORAL | Status: DC

## 2020-01-25 MED ORDER — ENOXAPARIN SODIUM 80 MG/0.8ML ~~LOC~~ SOLN
80.0000 mg | Freq: Two times a day (BID) | SUBCUTANEOUS | 1 refills | Status: DC
Start: 2020-01-25 — End: 2020-03-04

## 2020-01-25 NOTE — Discharge Summary (Signed)
Name: Dennis Bates MRN: [id number] DOB: [birth year] 59 y.o. PCP: Rhetta Mura, PA-C  Date of Admission: 01/18/2020  2:45 PM Date of Discharge: 01/25/2020 Attending Physician: No att. providers found  Discharge Diagnosis: 1. Symptomatic Iron Deficiency Anemia 2. Type 2 Diabetes 3. Prosthetic Valve on Coumadin  4. Amaurosis Fugax  Discharge Medications: Allergies as of 01/25/2020   No Known Allergies     Medication List    TAKE these medications   acetaminophen 325 MG tablet Commonly known as: TYLENOL Take 2 tablets (650 mg total) by mouth every 6 (six) hours as needed for mild pain.   ASPIRIN 81 PO Take 1 tablet by mouth daily.   carvedilol 3.125 MG tablet Commonly known as: COREG Take 1 tablet (3.125 mg total) by mouth 2 (two) times daily.   enoxaparin 80 MG/0.8ML injection Commonly known as: LOVENOX Inject 0.8 mLs (80 mg total) into the skin every 12 (twelve) hours for 10 doses.   gemfibrozil 600 MG tablet Commonly known as: LOPID Take 600 mg by mouth in the morning and at bedtime.   metFORMIN 1000 MG tablet Commonly known as: GLUCOPHAGE Take 1,000 mg by mouth 2 (two) times daily with a meal.   rosuvastatin 20 MG tablet Commonly known as: CRESTOR Take 20 mg by mouth at bedtime.   warfarin 10 MG tablet Commonly known as: COUMADIN Take 10 mg by mouth daily.       Disposition and follow-up:   Mr.Don Rupnow was discharged from [facility] in Stable condition.  At the hospital follow up visit please address:  1.  Dosing of Coumadin to therapeutic level, Further workup for Amaurosis Fugax  2.  Labs / imaging needed at time of follow-up: INR  3.  Pending labs/ test needing follow-up: None  Follow-up Appointments: Follow-up Information    Althisar, Sherilyn Cooter, PA-C. Schedule an appointment as soon as possible for a visit in 5 day(s).   Specialty: Physician Assistant Why: Please reach out to your primary care [place] to follow up about  your warfarin dosing after completing your lovenox (01/30/2020).  Contact information: [address] [place] [address] [phone]        Marykay Lex, MD .   Specialty: Cardiology Contact information: [address] [place] [address] [phone]           Hospital Course by problem list: 1. Symptomatic Iron Deficiency Anemia Patient presented to the Emergency room with two weeks of shortness of breath and melena. His labs showed a hemoglobin of five. He was given two units of packed red blood cells with an improvement in his hemoglobin of 7.1. Due to patient's history of CAD he received an additional unit of blood with a goal greater than a hemoglobin greater than 8. His coumadin and aspirin were held and gastroenterology was consulted. He was placed on clear liquid diet, and had a colonoscopy and EGD. Colonoscopy showed internal hemorrhoids and rare diverticula. EGD was and found a A 10 mm non-bleeding diverticulum was found in the second portion of the duodenum.  Patient underwent pill endoscopy which showed a small AVM in duodenal bulb. His pill study was followed by an enteroscopy which was unremarkable. Patient's hemoglobin was elevated above 8 during his stay. He was started on Lovenox with a bridge to warfarin and follow up instructions to speak with his PCP about his warfarin dosing.   2. Type 2 Diabetes Patient with diabetes was managed with sliding scale insulin during his hospital  stay. He was discharged in stable condition on his home medication of metformin.   3. Prosthetic Valve on Coumadin  Patient with a history of myocardial infarction and bicuspid aortic valve status post prosthetic valve in 2017 on chronic coumadin.Patient's coumadin was held during his hospitalization due to gastrointestinal bleed. Patient discharged in stable condition on a Lovenox to warfarin bridge.   4. Amaurosis Fugax Patient presents with transient loss of  vision of the left eye. Patient describes a "curtain" coming over his eye. The episode lasted several minutes. MRA angio head and neck showed no signs of stroke or severe stenosis of the carotid arteries, but there was moderate stenoses at the origins of both vertebral arteries.   Discharge Vitals:   BP 120/73 (BP Location: Right Arm)   Pulse 72   Temp 97.7 F (36.5 C)   Resp 16   Ht 5\' 8"  (1.727 m)   Wt 80.2 kg   SpO2 100%   BMI 26.88 kg/m   Pertinent Labs, Studies, and Procedures:   Ref Range & Units 8 d ago  (01/20/20) 9 d ago  (01/19/20) 9 d ago  (01/19/20) 10 d ago  (01/18/20)  WBC 4.0 - 10.5 K/uL 5.0   6.3  7.7   RBC 4.22 - 5.81 MIL/uL 3.02Low    2.67Low   2.09Low    Hemoglobin 13.0 - 17.0 g/dL 03/19/20   0.9WJX   9.1YNW  CM  5.0Low Panic  CM   HCT 39.0 - 52.0 % 25.8Low   26.7Low  CM  22.0Low   17.4Low    MCV 80.0 - 100.0 fL 85.4   82.4  83.3   MCH 26.0 - 34.0 pg 26.8   26.6  23.9Low    MCHC 30.0 - 36.0 g/dL 2.9FAO   13.0  86.5    RDW 11.5 - 15.5 % 15.7High    14.6  15.2   Platelets 150 - 400 K/uL 278   278  375   nRBC 0.0 - 0.2 % 0.4High    0.3High  CM  0.4High     EGD Findings: The entire examined stomach was normal. A 10 mm non-bleeding diverticulum was found in the second portion of the duodenum. The exam was otherwise without abnormality. -Incidental non-bleeding duodenal diverticulum. -Otherwise normal EGD. -No biopsies were taken since INR was elevated.  Colonoscopy Findings: A few rare small-mouthed diverticula were found in the sigmoid colon. Non-bleeding internal hemorrhoids were found during retroflexion. The hemorrhoids were small. The terminal ileum appeared normal. The exam was otherwise without abnormality on direct and retroflexion views.  Pill Endoscopy:  Small AVM on duodenal bulb No active bleeding, or blood in the Lumen Otherwise of negative study  MRA Neck:  IMPRESSION: The bilateral common and internal carotid arteries are patent within the  neck without significant stenosis.  The vertebral arteries are patent within the neck bilaterally with antegrade flow. Apparent moderate stenoses at the origins of both vertebral arteries.  MR Angio Head IMPRESSION: 1. No intracranial large vessel occlusion or proximal high-grade arterial stenosis. 2. Mild focal stenosis versus artifact within the distal P2 right posterior cerebral artery. Discharge Instructions: Discharge Instructions    Call MD for:  difficulty breathing, headache or visual disturbances   Complete by: As directed    Call MD for:  extreme fatigue   Complete by: As directed    Call MD for:  persistant dizziness or light-headedness   Complete by: As directed    Diet - low sodium heart  healthy   Complete by: As directed    Increase activity slowly   Complete by: As directed       Signed: Maudie Mercury, MD 01/28/2020, 7:12 PM   Pager: [phone]

## 2020-01-25 NOTE — Progress Notes (Signed)
ADDENDUM: Canceled IV heparin.  Changed to Lovenox bridge to warfarin for mechanical AVR   ANTICOAGULATION CONSULT NOTE - follow up  Pharmacy Consult for lovenox and Warfarin Indication:  mechanical AVR  No Known Allergies  Patient Measurements: Height: 5\' 8"  (172.7 cm) Weight: 80.2 kg (176 lb 12.9 oz) IBW/kg (Calculated) : 68.4 kg Heparin dosing weight: 77.9 kg  Vital Signs: Temp: 97.7 F (36.5 C) (05/14 1001) Temp Source: Oral (05/14 0455) BP: 120/73 (05/14 1001) Pulse Rate: 72 (05/14 1001)  Labs: Recent Labs    01/22/20 1405 01/23/20 0603 01/23/20 0603 01/24/20 0536 01/25/20 0552  HGB  --  8.8*   < > 8.7* 9.6*  HCT  --  28.7*  --  28.4* 31.2*  PLT  --  293  --  285 290  LABPROT  --  14.7  --  14.0 13.7  INR  --  1.2  --  1.1 1.1  HEPRLOWMOCWT 1.22  --   --   --   --   CREATININE  --   --   --  1.01 0.86   < > = values in this interval not displayed.    Estimated Creatinine Clearance: 90.6 mL/min (by C-G formula based on SCr of 0.86 mg/dL).   Medical History: Past Medical History:  Diagnosis Date  . Aortic stenosis due to bicuspid aortic valve    Severe aortic stenosis of bicuspid valve - s/pAVR with [place] ( 09/2015)  . Bell's palsy    LAST EPISODE 09-2015  . CAD S/P percutaneous coronary angioplasty 09/20/2014   a. inferolat STEMI ->> LHC-Angio: Proximal/ostial LAD 20%, OM2 99% -->> PCI: 66mm x 16 mm Promus Premier DES to the OM2.(~3.5 mm)  . Diabetes mellitus type 2, controlled, with complications (HCC)   . Hyperlipidemia   . Hypertension   . Hypertriglyceridemia   . ST elevation myocardial infarction (STEMI) of inferior wall (HCC) 09/20/2014   99 % occluded Cx-OM2 Promus DES 3.0 mm x 16 mm (3.5 mm)    Medications:  Medications Prior to Admission  Medication Sig Dispense Refill Last Dose  . acetaminophen (TYLENOL) 325 MG tablet Take 2 tablets (650 mg total) by mouth every 6 (six) hours as needed for mild pain.   01/18/2020 at Unknown time  . ASPIRIN 81  PO Take 1 tablet by mouth daily.    01/18/2020 at Unknown time  . carvedilol (COREG) 3.125 MG tablet Take 1 tablet (3.125 mg total) by mouth 2 (two) times daily. 60 tablet 3 01/18/2020 at 0600  . gemfibrozil (LOPID) 600 MG tablet Take 600 mg by mouth in the morning and at bedtime.   01/18/2020 at Unknown time  . metFORMIN (GLUCOPHAGE) 1000 MG tablet Take 1,000 mg by mouth 2 (two) times daily with a meal.    01/18/2020 at Unknown time  . rosuvastatin (CRESTOR) 20 MG tablet Take 20 mg by mouth at bedtime.   01/17/2020 at Unknown time  . warfarin (COUMADIN) 10 MG tablet Take 10 mg by mouth daily.   01/18/2020 at 0600    Assessment: 59 y.o male on Coumadin 10mg  daily PTA for mechanical AVR.  Patient checks INRs at home.  Per pt, last was 2.3 on 5/6.  Goal INR 2-3. Coumadin held due to upper GI bleeding, symptomatic anemia. S/P 3 PRBC transfusions. Additionally, S/P Feraheme on 01/19/2020 PTA warfarin dose was 10 mg daily with therapeutic INR 2.8 on admit with anemia, Hgb = 5.0 on admit.   5/10 S/p EGD, colonoscopy for Fe defic  anemia.  Findings: incidental non-bleeding duodenal diverticulum, no biopsies taken since INR elevated.  Colonscopy: mild sigmoid diverticulosis, no active LGI bleeding, non-bleeding internal small hemorrhoids.   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  5/13 s/p enteroscopy:  CTA done and result pending , GI noted  suspects it will not show active SB bleeding.  GI ordered start IV heparin bridge & resume warfarin.  GI plans SB video capsule on Fri 5/14 . NPO after MN. HGb low  stable 8.7, pltc wnl,  No melena noted since Lovenox started 5/10.  Lovenox stopped 5/12 for enteroscopy.  Now to start IV heparin and resume warfarin for h/o mechanical AVR  IV heparin has been transition to SQ lovenox for bridging while waiting for INR. Pt is getting capsule endoscopy today. INR still coming back low today at 1.1  after 2 doses of coumadin. We will increase his dose today.   Goal of Therapy:  INR 2-3 Monitor platelets by anticoagulation protocol: Yes   Plan:   Daily INR, CBC Warfarin 12.5 mg x1 today Follow up post SB capsule procedure  Onnie Boer, PharmD, BCIDP, AAHIVP, CPP Infectious Disease Pharmacist 01/25/2020 10:34 AM

## 2020-01-25 NOTE — Progress Notes (Signed)
Dennis Bates to be discharged Home per MD order. Discussed prescriptions and follow up appointments with the patient. Prescriptions given to patient; medication list explained in detail. Patient verbalized understanding.  Skin clean, dry and intact without evidence of skin break down, no evidence of skin tears noted. IV catheter discontinued intact. Site without signs and symptoms of complications. Dressing and pressure applied. Pt denies pain at the site currently. No complaints noted.  Patient free of lines, drains, and wounds.   An After Visit Summary (AVS) was printed and given to the patient. Patient escorted via wheelchair, and discharged home via private auto.  Shanna Cisco, RN

## 2020-01-25 NOTE — Progress Notes (Signed)
Subjective:  ON events: None   Pt seen at the bedside on rounds this AM. Pt states that he has no issues today and is expecting to go home. He denies any headaches, vision changes, abdominal pain or abnormal bowel movements. Pt inquires about what are some warning signs he should look out for after he leaves the hospital. Pt was educated encouraged to reach out to GI or the ER if he is feeling worse. All concerns were addressed to the patient's satisfaction.    Objective:  Vital signs in last 24 hours: Vitals:   01/24/20 1144 01/24/20 1600 01/24/20 2154 01/25/20 0455  BP: 127/70 132/74 118/70 112/71  Pulse: (!) 54 62 75 61  Resp: 18 18 12 16   Temp: (!) 97 F (36.1 C) 98 F (36.7 C) 97.9 F (36.6 C) 97.9 F (36.6 C)  TempSrc: Oral Oral Oral Oral  SpO2: 100% 98% 98% 97%  Weight:   80.2 kg   Height:       Physical Exam Vitals and nursing note reviewed.  Constitutional:      General: He is not in acute distress.    Appearance: Normal appearance. He is well-developed. He is not ill-appearing or toxic-appearing.  HENT:     Head: Normocephalic and atraumatic.  Eyes:     General:        Right eye: No discharge.        Left eye: No discharge.     Conjunctiva/sclera: Conjunctivae normal.  Cardiovascular:     Rate and Rhythm: Normal rate and regular rhythm.     Pulses: Normal pulses.     Heart sounds: Normal heart sounds. No murmur. No friction rub. No gallop.   Pulmonary:     Effort: Pulmonary effort is normal.     Breath sounds: Normal breath sounds. No wheezing, rhonchi or rales.  Abdominal:     General: Bowel sounds are normal.     Palpations: Abdomen is soft.     Tenderness: There is no abdominal tenderness. There is no guarding.  Musculoskeletal:        General: No swelling.  Neurological:     Mental Status: He is alert and oriented to person, place, and time.  Psychiatric:        Mood and Affect: Mood normal.        Behavior: Behavior normal.     CBC Latest Ref  Rng & Units 01/25/2020 01/24/2020 01/23/2020  WBC 4.0 - 10.5 K/uL 4.0 3.7(L) 4.5  Hemoglobin 13.0 - 17.0 g/dL 03/24/2020) 9.2(J) 1.9(E)  Hematocrit 39.0 - 52.0 % 31.2(L) 28.4(L) 28.7(L)  Platelets 150 - 400 K/uL 290 285 293    BMP Latest Ref Rng & Units 01/25/2020 01/24/2020 01/22/2020  Glucose 70 - 99 mg/dL 03/23/2020) 081(K) 481(E)  BUN 6 - 20 mg/dL 8 5(L) 563(J)  Creatinine 0.61 - 1.24 mg/dL <4(H 7.02 6.37  Sodium 135 - 145 mmol/L 138 142 140  Potassium 3.5 - 5.1 mmol/L 4.5 4.4 4.2  Chloride 98 - 111 mmol/L 104 108 110  CO2 22 - 32 mmol/L 26 25 22   Calcium 8.9 - 10.3 mg/dL 9.4 9.2 9.2   MRA Neck:  IMPRESSION: The bilateral common and internal carotid arteries are patent within the neck without significant stenosis.  The vertebral arteries are patent within the neck bilaterally with antegrade flow. Apparent moderate stenoses at the origins of both vertebral arteries.  MR Angio Head IMPRESSION: 1. No intracranial large vessel occlusion or proximal high-grade arterial stenosis. 2.  Mild focal stenosis versus artifact within the distal P2 right posterior cerebral artery. Assessment/Plan:  Active Problems:   Gastrointestinal hemorrhage   Symptomatic anemia   Melena   Iron deficiency anemia   Chronic anticoagulation  Dennis Bates is a 59 year old male, with a PMHx of HTN, HLD, L sided Bells palsy, CAD, MI (sp circumflex-OM2, bicuspid valve sp aortic valve replacement), who presented for [facility] and admitted for symptomatic anemia.   Symptomatic Iron Deficiency Anemia: Likely GI based on history of progressive shortness of breath, dark stools. He has remained HDS. S/P 3 PRBC transfusions. Additionally, S/P Faraheme on 01/19/2020. CTA was unrevealing for bleeds. 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  - We appreciate GI's recommendations.   - Enteroscopy performed yesterday, no intervention.  - Hgb: 9.6.  - Transfusion goal is hgb > 8 given CAD Hx.   Type 2  Diabetes: Patient with Hgb A1c of 5.9% presented to [facility]. Controlled on Metformin 1000 mg BID. - Controlled on SSI  History of MI with Prosthetic Valve:  - Holding ASA - Warfarin with Lovenox bridge  - INR: 1.1  Amaurosis Fugax: Patient instructed to warn staff if he experiences symptoms. If he is to experience symptoms at home he was instructed to immediately come to the ED for reevaluation.   Prior to Admission Living Arrangement: Home Anticipated Discharge Location: Home Barriers to Discharge: Continued medical work-up  Dispo: Anticipated discharge in approximately 2-3 day(s).   Maudie Mercury, MD 01/25/2020, 7:41 AM Pager: [phone]

## 2020-01-25 NOTE — Discharge Instructions (Signed)
To Dennis Bates,  It was a pleasure working with you during your hospital stay at [facility]. During your stay, you were evaluated for a gastric bleed. You were given blood and your hemoglobin levels have been stable during your stay. 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 If you begin to experience shortness of breath, dark/black schools, or feelings of light headedness, please come back to the emergency department for reevaluation.   Dose of enoxaparin given at 12pm today at bedside. Next dose can be given between 10:30pm - 12am whenever convenient for patient.  Remember to use alcohol wipe to clean site and to rotate injection sites with each dose.    Gastrointestinal Bleeding Gastrointestinal (GI) bleeding is bleeding somewhere along the digestive tract, between the mouth and the anus. This tract includes the mouth, esophagus, stomach, small intestine, large intestine, and anus. The large intestine is often called the colon. GI bleeding can be caused by various problems. The severity of these problems can range from mild to serious or even life-threatening. If you have GI bleeding, you may find blood in your stools (feces), you may have black stools, or you may vomit blood. If there is a lot of bleeding, you may need to stay in the hospital. What are the causes? This condition may be caused by:  Inflammation, irritation, or swelling of the esophagus (esophagitis). The esophagus is part of the body that moves food from your mouth to your stomach.  Swollen veins in the rectum (hemorrhoids).  Areas of painful tearing in the anus that are often caused by passing hard stool (anal fissures).  Pouches that form on the colon over time, with age, and may bleed a lot (diverticulosis).  Inflammation (diverticulitis) in areas with diverticulosis. This can cause pain, fever, and bloody stools,  although bleeding may be mild.  Growths (polyps) or cancer. Colon cancer often starts out as precancerous polyps.  Gastritis and ulcers. With these, bleeding may come from the upper GI tract, near the stomach. What increases the risk? You are more likely to develop this condition if you:  Have an infection in your stomach from a type of bacteria called Helicobacter pylori.  Take certain medicines, such as: ? NSAIDs. ? Aspirin. ? Selective serotonin reuptake inhibitors (SSRIs). ? Steroids. ? Antiplatelet or anticoagulant medicines.  Smoke.  Drink alcohol. What are the signs or symptoms? Common symptoms of this condition include:  Bright red blood in your vomit, or vomit that looks like coffee grounds.  Bloody, black, or tarry stools. ? Bleeding from the lower GI tract will usually cause red or maroon blood in the stools. ? Bleeding from the upper GI tract may cause black, tarry stools that are often stronger smelling than usual. ? In certain cases, if the bleeding is fast enough, the stools may be red.  Pain or cramping in the abdomen. How is this diagnosed? This condition may be diagnosed based on:  Your medical history and a physical exam.  Various tests, such as: ? Blood tests. ? Stool tests. ? X-rays and other imaging tests. ? Esophagogastroduodenoscopy (EGD). In this test, a flexible, lighted tube is used to look at your esophagus, stomach, and small intestine. ? Colonoscopy. In this test, a flexible, lighted tube is used to look at your colon. How is this treated? Treatment for this condition depends on the cause of the bleeding. For example:  For bleeding from the  esophagus, stomach, small intestine, or colon, the health care provider doing your EGD or colonoscopy may be able to stop the bleeding as part of the procedure.  Inflammation or infection of the colon can be treated with medicines.  Certain rectal problems can be treated with creams, suppositories, or  warm baths.  Medicines may be given to reduce acid in your stomach.  Surgery is sometimes needed.  Blood transfusions are sometimes needed if a lot of blood has been lost. If bleeding is mild, you may be allowed to go home. If there is a lot of bleeding, you will need to stay in the hospital for observation. Follow these instructions at home:   Take over-the-counter and prescription medicines only as told by your health care provider.  Eat foods that are high in fiber, such as beans, whole grains, and fresh fruits and vegetables. This will help to keep your stools soft. Eating 1-3 prunes each day works well for many people.  Drink enough fluid to keep your urine pale yellow.  Keep all follow-up visits as told by your health care provider. This is important. Contact a health care provider if:  Your symptoms do not improve. Get help right away if:  Your bleeding does not stop.  You feel light-headed or you faint.  You feel weak.  You have severe cramps in your back or abdomen.  You pass large blood clots in your stool.  Your symptoms are getting worse.  You have chest pain or fast heartbeats. Summary  Gastrointestinal (GI) bleeding is bleeding somewhere along the digestive tract, between the mouth and anus. GI bleeding can be caused by various problems. The severity of these problems can range from mild to serious or even life-threatening.  Treatment for this condition depends on the cause of the bleeding.  Take over-the-counter and prescription medicines only as told by your health care provider.  Keep all follow-up visits as told by your health care provider. This is important.  Get help right away if your bleeding increases, your symptoms are getting worse, or you have new symptoms. This information is not intended to replace advice given to you by your health care provider. Make sure you discuss any questions you have with your health care provider. Document Revised:  04/12/2018 Document Reviewed: 04/12/2018 Elsevier Patient Education  [place].

## 2020-01-25 NOTE — Progress Notes (Signed)
Patient ID: Dennis Bates, male   DOB: Sep 05, 1961, 59 y.o.   MRN: [id number]    Progress Note   Subjective  Day # 6  CC; melena, anemia  Enteroscopy yesterday- negative - no AVM found  HGB 9.6 today   Pt feels fine, hoping to go home , no melena     Objective   Vital signs in last 24 hours: Temp:  [97 F (36.1 C)-98 F (36.7 C)] 97.7 F (36.5 C) (05/14 1001) Pulse Rate:  [54-75] 72 (05/14 1001) Resp:  [12-18] 16 (05/14 1001) BP: (112-133)/(69-82) 120/73 (05/14 1001) SpO2:  [97 %-100 %] 100 % (05/14 1001) Weight:  [80.2 kg] 80.2 kg (05/13 2154) Last BM Date: 01/23/20 General:     WM  male  in NAD Heart:  Regular rate and rhythm; no murmurs, valve clisk Lungs: Respirations even and unlabored, lungs CTA bilaterally Abdomen:  Soft, nontender and nondistended. Normal bowel sounds. Extremities:  Without edema. Neurologic:  Alert and oriented,  grossly normal neurologically. Psych:  Cooperative. Normal mood and affect.  Intake/Output from previous day: 05/13 0701 - 05/14 0700 In: 1367 [P.O.:907; I.V.:250; IV Piggyback:210] Out: 0  Intake/Output this shift: No intake/output data recorded.  Lab Results: Recent Labs    01/23/20 0603 01/24/20 0536 01/25/20 0552  WBC 4.5 3.7* 4.0  HGB 8.8* 8.7* 9.6*  HCT 28.7* 28.4* 31.2*  PLT 293 285 290   BMET Recent Labs    01/24/20 0536 01/25/20 0552  NA 142 138  K 4.4 4.5  CL 108 104  CO2 25 26  GLUCOSE 117* 158*  BUN 5* 8  CREATININE 1.01 0.86  CALCIUM 9.2 9.4   LFT No results for input(s): PROT, ALBUMIN, AST, ALT, ALKPHOS, BILITOT, BILIDIR, IBILI in the last 72 hours. PT/INR Recent Labs    01/24/20 0536 01/25/20 0552  LABPROT 14.0 13.7  INR 1.1 1.1    Studies/Results: MR ANGIO HEAD WO CONTRAST  Result Date: 01/23/2020 CLINICAL DATA:  Vision loss, monocular. Additional history provided: EXAM: MRA HEAD WITHOUT CONTRAST TECHNIQUE: Angiographic images of the Circle of Willis were obtained using MRA technique  without intravenous contrast. COMPARISON:  Head CT 09/14/2013. FINDINGS: The intracranial internal carotid arteries are patent without significant stenosis. The M1 middle cerebral arteries are patent without significant stenosis. No M2 proximal branch occlusion or high-grade proximal stenosis is identified. The anterior cerebral arteries are patent without significant proximal stenosis. The A1 left ACA is hypoplastic or developmentally absent. No intracranial aneurysm is identified. The intracranial vertebral arteries are patent without significant stenosis, as is the basilar artery. The posterior cerebral arteries are patent bilaterally. Apparent mild focal stenosis within the distal P2 right PCA versus artifact (series 254, image 18). Posterior communicating arteries are hypoplastic or absent bilaterally. IMPRESSION: 1. No intracranial large vessel occlusion or proximal high-grade arterial stenosis. 2. Mild focal stenosis versus artifact within the distal P2 right posterior cerebral artery. Electronically Signed   By: Dennis Loge DO   On: 01/23/2020 14:59   MR ANGIO NECK W WO CONTRAST  Result Date: 01/23/2020 CLINICAL DATA:  Vision loss, monocular. EXAM: MRA NECK WITHOUT AND WITH CONTRAST TECHNIQUE: Multiplanar and multiecho pulse sequences of the neck were obtained without and with intravenous contrast. Angiographic images of the neck were obtained using MRA technique without and with intravenous contrast. CONTRAST:  7.30mL GADAVIST GADOBUTROL 1 MMOL/ML IV SOLN COMPARISON:  No pertinent prior studies available for comparison. FINDINGS: Standard aortic branching. The bilateral common and internal carotid arteries are patent within  the neck without significant stenosis (50% or greater). The vertebral arteries are codominant and patent within the neck bilaterally with antegrade flow. The distal right vertebral artery is duplicated. There are apparent moderate stenoses at the origins of both vertebral arteries.  IMPRESSION: The bilateral common and internal carotid arteries are patent within the neck without significant stenosis. The vertebral arteries are patent within the neck bilaterally with antegrade flow. Apparent moderate stenoses at the origins of both vertebral arteries. Electronically Signed   By: Dennis Simmering DO   On: 01/23/2020 15:06       Assessment / Plan:    #1 59 yo WM on chronic coumadin with hx of AVR, admitted with symptomatic anemia, heme +, recent melena  EGD and Colonoscopy unrevealing as to source, mild sigmoid diverticulosis  Capsule endoscopy - one small duodenal bulb AVM  Enteroscopy - negative  hgb stable at 9.6   Plan; OK for discharge from GI standpoint , back on Coumadin S/P Iv Fe yesterday - will alsoset up for second dose IV FE as Outpt  Will schedule for F/U labs at our office next week , and office follow up with Dr Tarri Glenn.     Active Problems:   Gastrointestinal hemorrhage   Symptomatic anemia   Melena   Iron deficiency anemia   Chronic anticoagulation     LOS: 7 days   Dennis Mikelson PA-C 01/25/2020, 10:59 AM

## 2020-01-29 ENCOUNTER — Other Ambulatory Visit: Payer: Self-pay

## 2020-01-29 ENCOUNTER — Telehealth: Payer: Self-pay

## 2020-01-29 DIAGNOSIS — K922 Gastrointestinal hemorrhage, unspecified: Secondary | ICD-10-CM

## 2020-01-29 DIAGNOSIS — D509 Iron deficiency anemia, unspecified: Secondary | ICD-10-CM

## 2020-01-29 NOTE — Telephone Encounter (Signed)
Left message for pt to call back. Order in epic for CBC

## 2020-01-29 NOTE — Telephone Encounter (Signed)
-----   Message from Sammuel Cooper, PA-C sent at 01/25/2020  2:32 PM EDT ----- Regarding: RE: labs, iron , office He did nt say anything to me about following up with any other GI - when you call him I think OK to mention that [facility]  said something about follow up in [place] - If he wants to do that , that's fine , still needs labs next week - if he wishes to stay with [place] then proceed . I sent her  a message ----- Message ----- From: Chrystie Nose, RN Sent: 01/25/2020   2:18 PM EDT To: Sammuel Cooper, PA-C Subject: FW: labs, iron , office                        Amy see note below from [facility] and advise. ----- Message ----- From: Tressia Danas, MD Sent: 01/25/2020  11:40 AM EDT To: Sammuel Cooper, PA-C, Chrystie Nose, RN Subject: RE: labs, iron , office                        He lives in [place] and had previously seen a GI there. Did he not want to return to their practice?  ----- Message ----- From: Dennis Bates Sent: 01/25/2020  11:35 AM EDT To: Chrystie Nose, RN, Tressia Danas, MD Subject: labs, iron , office                            Pt being discharged today , iron def anemia, melena on coumadin  Enteroscopy negative yesterday   Bonita Quin - please schedule him for anoth Iron infusion / fereheme in a week or so  Needs cbc on Tuesday 5/18 put under Dr Orvan Falconer, and if stable repeat again in a week   Needs office visit with Dr Orvan Falconer in 3-4 weeks , thanks

## 2020-01-30 ENCOUNTER — Other Ambulatory Visit (INDEPENDENT_AMBULATORY_CARE_PROVIDER_SITE_OTHER): Payer: Self-pay

## 2020-01-30 DIAGNOSIS — K922 Gastrointestinal hemorrhage, unspecified: Secondary | ICD-10-CM

## 2020-01-30 LAB — CBC WITH DIFFERENTIAL/PLATELET
Basophils Absolute: 0.1 10*3/uL (ref 0.0–0.1)
Basophils Relative: 2 % (ref 0.0–3.0)
Eosinophils Absolute: 0.1 10*3/uL (ref 0.0–0.7)
Eosinophils Relative: 2.9 % (ref 0.0–5.0)
HCT: 32.6 % — ABNORMAL LOW (ref 39.0–52.0)
Hemoglobin: 10.8 g/dL — ABNORMAL LOW (ref 13.0–17.0)
Lymphocytes Relative: 28.2 % (ref 12.0–46.0)
Lymphs Abs: 1 10*3/uL (ref 0.7–4.0)
MCHC: 33.2 g/dL (ref 30.0–36.0)
MCV: 86.2 fl (ref 78.0–100.0)
Monocytes Absolute: 0.3 10*3/uL (ref 0.1–1.0)
Monocytes Relative: 8.6 % (ref 3.0–12.0)
Neutro Abs: 2.1 10*3/uL (ref 1.4–7.7)
Neutrophils Relative %: 58.3 % (ref 43.0–77.0)
Platelets: 240 10*3/uL (ref 150.0–400.0)
RBC: 3.79 Mil/uL — ABNORMAL LOW (ref 4.22–5.81)
RDW: 18.9 % — ABNORMAL HIGH (ref 11.5–15.5)
WBC: 3.7 10*3/uL — ABNORMAL LOW (ref 4.0–10.5)

## 2020-01-31 ENCOUNTER — Other Ambulatory Visit: Payer: Self-pay

## 2020-01-31 DIAGNOSIS — D509 Iron deficiency anemia, unspecified: Secondary | ICD-10-CM

## 2020-02-07 ENCOUNTER — Telehealth: Payer: Self-pay | Admitting: Gastroenterology

## 2020-02-08 NOTE — Telephone Encounter (Signed)
Left detailed message with information regarding IV Iron infusion, pt advised that IV infusion will be at [facility] Short stay on 02/13/2020, pt advised to go to Main entrance of hospital and will be directed from there. Patient advised to call back regarding symptoms that he is having.

## 2020-02-12 NOTE — Telephone Encounter (Signed)
Spoke with patient, patient aware of IV Iron Infusion tomorrow at 9 am at [facility].

## 2020-02-12 NOTE — Telephone Encounter (Signed)
Left message for patient to return call letting us know that he is aware of his appointment for IV Infusion at [facility] short stay on 02/13/2020 at 9 am.

## 2020-02-13 ENCOUNTER — Other Ambulatory Visit: Payer: Self-pay

## 2020-02-13 ENCOUNTER — Ambulatory Visit (HOSPITAL_COMMUNITY)
Admission: RE | Admit: 2020-02-13 | Discharge: 2020-02-13 | Disposition: A | Discharge status: Home / Self Care | Payer: BC Managed Care – PPO | Source: Ambulatory Visit | Attending: Gastroenterology | Admitting: Gastroenterology

## 2020-02-13 DIAGNOSIS — D509 Iron deficiency anemia, unspecified: Secondary | ICD-10-CM | POA: Insufficient documentation

## 2020-02-13 MED ORDER — SODIUM CHLORIDE 0.9 % IV SOLN
510.0000 mg | Freq: Once | INTRAVENOUS | Status: AC
  Administered 2020-02-13: 510 mg via INTRAVENOUS
  Filled 2020-02-13: qty 17

## 2020-03-04 ENCOUNTER — Other Ambulatory Visit: Payer: Self-pay

## 2020-03-04 ENCOUNTER — Encounter: Payer: Self-pay | Admitting: Gastroenterology

## 2020-03-04 ENCOUNTER — Other Ambulatory Visit (INDEPENDENT_AMBULATORY_CARE_PROVIDER_SITE_OTHER): Payer: BC Managed Care – PPO

## 2020-03-04 ENCOUNTER — Ambulatory Visit (INDEPENDENT_AMBULATORY_CARE_PROVIDER_SITE_OTHER): Payer: BC Managed Care – PPO | Admitting: Gastroenterology

## 2020-03-04 VITALS — BP 124/70 | HR 80 | Ht 67.0 in | Wt 180.0 lb

## 2020-03-04 DIAGNOSIS — D500 Iron deficiency anemia secondary to blood loss (chronic): Secondary | ICD-10-CM | POA: Diagnosis not present

## 2020-03-04 DIAGNOSIS — Z7901 Long term (current) use of anticoagulants: Secondary | ICD-10-CM | POA: Diagnosis not present

## 2020-03-04 DIAGNOSIS — D509 Iron deficiency anemia, unspecified: Secondary | ICD-10-CM

## 2020-03-04 LAB — CBC WITH DIFFERENTIAL/PLATELET
Basophils Absolute: 0 10*3/uL (ref 0.0–0.1)
Basophils Relative: 0.4 % (ref 0.0–3.0)
Eosinophils Absolute: 0.1 10*3/uL (ref 0.0–0.7)
Eosinophils Relative: 2.3 % (ref 0.0–5.0)
HCT: 24.1 % — ABNORMAL LOW (ref 39.0–52.0)
Hemoglobin: 8.3 g/dL — ABNORMAL LOW (ref 13.0–17.0)
Lymphocytes Relative: 24 % (ref 12.0–46.0)
Lymphs Abs: 1.3 10*3/uL (ref 0.7–4.0)
MCHC: 34.2 g/dL (ref 30.0–36.0)
MCV: 89.4 fl (ref 78.0–100.0)
Monocytes Absolute: 0.5 10*3/uL (ref 0.1–1.0)
Monocytes Relative: 8.2 % (ref 3.0–12.0)
Neutro Abs: 3.6 10*3/uL (ref 1.4–7.7)
Neutrophils Relative %: 65.1 % (ref 43.0–77.0)
Platelets: 301 10*3/uL (ref 150.0–400.0)
RBC: 2.7 Mil/uL — ABNORMAL LOW (ref 4.22–5.81)
RDW: 19.2 % — ABNORMAL HIGH (ref 11.5–15.5)
WBC: 5.5 10*3/uL (ref 4.0–10.5)

## 2020-03-04 LAB — FERRITIN: Ferritin: 48.6 ng/mL (ref 22.0–322.0)

## 2020-03-04 LAB — IRON: Iron: 88 ug/dL (ref 42–165)

## 2020-03-04 NOTE — Progress Notes (Signed)
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 Additional labs show resolution of iron deficiency with iron 88, ferritin 48.6.  Must consider additional AVMs as well as concurrent causes of anemia.  Given his progressive symptoms, will work towards urgent evaluation.  PLAN: - Continue oral iron supplements - Follow serial hgb/hct with transfusion if indicated - Referral to hematology for further evaluation/treatment of anemia - Referral to [facility] for deep/double balloon enteroscopy - Consider outpatient octreotide if there is further evidence for AVM bleeding - Go to the ER with progressive symptoms   Please see the "Patient Instructions" section for addition details about the plan.  HPI: Dennis Bates is a 59 y.o. male who returns in follow-up after his recent hospitalization in May 2021 for GI blood loss anemia. On warfarin with history of AVR. Also has CAD, diabetes, osteoarthritis, hypertriglyceridemia.    Seen today in scheduled follow-up but he notes progressive shortness of breath and recurrence of black, tarry, malodorous stools similar to his symptoms prior to recent hospitalization. Was feeling better after discharge and his IV iron. But, symptoms have slowly returned.  Having one bowel movement daily.   FOBT + stools 01/18/20 EGD and Colonoscopy unrevealing as to source, mild sigmoid diverticulosis Capsule  endoscopy - one small duodenal bulb AVM Enteroscopy - negative CTA abd/pelvis with and without contrast - negative for bleeding Received IV iron, takes oral iron  No overt GI blood loss. No hematochezia, bright red blood per rectum. No epistaxis, hemoptysis, or hematuria.   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 Reports 100% adherence with oral iron supplements.   No identified exacerbating or relieving features.  Labs 01/26/20: hgb 9.6, MCV 88.9, platelets 290 Labs 01/30/20: hgb 10.8, MCV 86.2, platelets 240    Past Medical History:  Diagnosis Date  . Aortic stenosis due to bicuspid aortic valve    Severe aortic stenosis of bicuspid valve - s/pAVR with Bentall ( 09/2015)  . Bell's palsy    LAST EPISODE 09-2015  . CAD S/P percutaneous coronary angioplasty 09/20/2014   a. inferolat STEMI ->> LHC-Angio: Proximal/ostial LAD 20%, OM2 99% -->> PCI: 26mm x 16 mm Promus Premier DES to the OM2.(~3.5 mm)  . Diabetes mellitus type 2, controlled, with complications (HCC)   . Hyperlipidemia   . Hypertension   . Hypertriglyceridemia   . ST elevation myocardial infarction (STEMI) of inferior wall (HCC) 09/20/2014   99 % occluded Cx-OM2 Promus DES 3.0 mm x 16 mm (3.5 mm)    Past Surgical History:  Procedure Laterality Date  . BENTALL PROCEDURE N/A 10/09/2015   Procedure: BENTALL PROCEDURE (using a St Jude mechanical valve, size 23);  Surgeon: Alleen Borne, MD;  Location: [facility] OR;  Service: Open Heart Surgery;  Laterality: N/A;  CIRC ARRESTNEEDS RIGHT RADIAL A-LINE  . COLONOSCOPY WITH PROPOFOL N/A 01/21/2020  Procedure: COLONOSCOPY WITH PROPOFOL;  Surgeon: Lynann Bologna, MD;  Location: [facility] ENDOSCOPY;  Service: Endoscopy;  Laterality: N/A;  . ENTEROSCOPY N/A 01/24/2020   Procedure: ENTEROSCOPY;  Surgeon: Sherrilyn Rist, MD;  Location: [facility] ENDOSCOPY;  Service: Gastroenterology;  Laterality: N/A;  .  ESOPHAGOGASTRODUODENOSCOPY (EGD) WITH PROPOFOL N/A 01/21/2020   Procedure: ESOPHAGOGASTRODUODENOSCOPY (EGD) WITH PROPOFOL;  Surgeon: Lynann Bologna, MD;  Location: [facility] ENDOSCOPY;  Service: Endoscopy;  Laterality: N/A;  . GIVENS CAPSULE STUDY N/A 01/22/2020   Procedure: GIVENS CAPSULE STUDY;  Surgeon: Sherrilyn Rist, MD;  Location: [facility] ENDOSCOPY;  Service: Gastroenterology;  Laterality: N/A;  . KNEE ARTHROSCOPY W/ ACL RECONSTRUCTION Right    90's; "2 ATHROSCOPIC , 2 REBUILDS"  . LEFT HEART CATHETERIZATION WITH CORONARY ANGIOGRAM N/A 09/20/2014   Procedure: LEFT HEART CATHETERIZATION WITH CORONARY ANGIOGRAM;  Surgeon: Marykay Lex, MD;  Location: [facility] CATH LAB;  Proximal/ostial LAD 20%, OM2 99% -->> aspiration thrombectomy and DES PCI:   . PERCUTANEOUS CORONARY STENT INTERVENTION (PCI-S)  09/20/2014   Aspiration thrombectomy and DES PCI- OM 2(m-dCx): Promus Premier DES 3.0 mm x 16 mm (3.5 mm)  . RIGHT/LEFT HEART CATH AND CORONARY ANGIOGRAPHY  09/2015   Dr. Nicki Guadalajara: Widely patent OM 2 stent.  Mild disease in RCA and LAD.Relatively normal right heart cath pressures.  Normal cardiac output.  . TEE WITHOUT CARDIOVERSION N/A 10/09/2015   Procedure: TRANSESOPHAGEAL ECHOCARDIOGRAM (TEE);  Surgeon: Alleen Borne, MD;  Location: [facility] OR;  Service: Open Heart Surgery;  Laterality: N/A;  . TOTAL KNEE ARTHROPLASTY Right 12/09/2017   Procedure: RIGHT TOTAL KNEE ARTHROPLASTY REMOVAL OF HARDWARE RIGHT KNEE;  Surgeon: Jodi Geralds, MD;  Location: [facility] ORS;  Service: Orthopedics;  Laterality: Right;  . TRANSTHORACIC ECHOCARDIOGRAM  02/2016   Post AVR: mechanical: AVR without obstuction. LVEF improved to 65-70%.  [name] ECHOCARDIOGRAM  January 2016, February2016   Probable bicuspid aortic valve: a. mod-sev by 2D ECHO 09/2014. AVA 0.9cm^2; b) 10/2014 Echo:. Severe AS Mn-Pk Gradient 41-71 mmHg, AVA ~0.79 cm2, EF 60-65%    Current Outpatient Medications  Medication Sig Dispense Refill  . acetaminophen  (TYLENOL) 325 MG tablet Take 2 tablets (650 mg total) by mouth every 6 (six) hours as needed for mild pain.    . ASPIRIN 81 PO Take 1 tablet by mouth daily.     . carvedilol (COREG) 3.125 MG tablet Take 1 tablet (3.125 mg total) by mouth 2 (two) times daily. 60 tablet 3  . ferrous sulfate 325 (65 FE) MG tablet Take 325 mg by mouth 2 (two) times daily with a meal.    . gemfibrozil (LOPID) 600 MG tablet Take 600 mg by mouth in the morning and at bedtime.    . metFORMIN (GLUCOPHAGE) 1000 MG tablet Take 1,000 mg by mouth 2 (two) times daily with a meal.     . rosuvastatin (CRESTOR) 20 MG tablet Take 20 mg by mouth at bedtime.    Marland Kitchen warfarin (COUMADIN) 10 MG tablet Take 10 mg by mouth daily.     No current facility-administered medications for this visit.    Allergies as of 03/04/2020  . (No Known Allergies)    Family History  Problem Relation Age of Onset  . Heart disease Mother   . CAD Neg Hx        per wife    Social History   Socioeconomic History  . Marital status: Married    Spouse name: Not on file  . Number of children: Not on  file  . Years of education: Not on file  . Highest education level: Not on file  Occupational History  . Not on file  Tobacco Use  . Smoking status: Never Smoker  . Smokeless tobacco: Former Systems developer    Types: Secondary school teacher  . Vaping Use: Never used  Substance and Sexual Activity  . Alcohol use: Yes    Alcohol/week: 0.0 standard drinks    Comment: socially  . Drug use: No  . Sexual activity: Not on file  Other Topics Concern  . Not on file  Social History Narrative  . Not on file   Social Determinants of Health   Financial Resource Strain:   . Difficulty of Paying Living Expenses:   Food Insecurity:   . Worried About Charity fundraiser in the Last Year:   . Arboriculturist in the Last Year:   Transportation Needs:   . Film/video editor (Medical):   Marland Kitchen Lack of Transportation (Non-Medical):   Physical Activity:   . Days of  Exercise per Week:   . Minutes of Exercise per Session:   Stress:   . Feeling of Stress :   Social Connections:   . Frequency of Communication with Friends and Family:   . Frequency of Social Gatherings with Friends and Family:   . Attends Religious Services:   . Active Member of Clubs or Organizations:   . Attends Archivist Meetings:   Marland Kitchen Marital Status:   Intimate Partner Violence:   . Fear of Current or Ex-Partner:   . Emotionally Abused:   Marland Kitchen Physically Abused:   . Sexually Abused:     Review of Systems: 12 system ROS is negative except as noted above.   Physical Exam: General:   Alert,  well-nourished, pleasant and cooperative in NAD Head:  Normocephalic and atraumatic. Eyes:  Sclera clear, no icterus.   Conjunctiva pink. Ears:  Normal auditory acuity. Nose:  No deformity, discharge,  or lesions. Mouth:  No deformity or lesions.   Neck:  Supple; no masses or thyromegaly. Lungs:  Clear throughout to auscultation.   No wheezes. Heart:  Regular rate and rhythm; no murmurs. Abdomen:  Soft,nontender, nondistended, normal bowel sounds, no rebound or guarding. No hepatosplenomegaly.   Rectal:  Deferred  Msk:  Symmetrical. No boney deformities LAD: No inguinal or umbilical LAD Extremities:  No clubbing or edema. Neurologic:  Alert and  oriented x4;  grossly nonfocal Skin:  Intact without significant lesions or rashes. Psych:  Alert and cooperative. Normal mood and affect.    Voncille Simm L. Tarri Glenn, MD, MPH 03/05/2020, 6:24 AM

## 2020-03-04 NOTE — Patient Instructions (Addendum)
Your provider has requested that you go to the basement level for lab work before leaving today. Press "B" on the elevator. The lab is located at the first door on the left as you exit the elevator.  I will send a referral to [facility] - they will call you to schedule an appointment.  I will refer you to a blood specialist for further evaluation of your anemia.  Please go to the ER with any progressive symptoms.

## 2020-03-05 ENCOUNTER — Encounter: Payer: Self-pay | Admitting: Family Medicine

## 2020-03-05 ENCOUNTER — Telehealth: Payer: Self-pay | Admitting: Hematology and Oncology

## 2020-03-05 ENCOUNTER — Ambulatory Visit: Payer: BC Managed Care – PPO | Admitting: Family Medicine

## 2020-03-05 ENCOUNTER — Ambulatory Visit (INDEPENDENT_AMBULATORY_CARE_PROVIDER_SITE_OTHER): Payer: BC Managed Care – PPO | Admitting: General Practice

## 2020-03-05 ENCOUNTER — Other Ambulatory Visit: Payer: Self-pay

## 2020-03-05 ENCOUNTER — Telehealth: Payer: Self-pay | Admitting: Gastroenterology

## 2020-03-05 VITALS — BP 114/60 | HR 85 | Temp 97.6°F | Wt 179.6 lb

## 2020-03-05 DIAGNOSIS — I2510 Atherosclerotic heart disease of native coronary artery without angina pectoris: Secondary | ICD-10-CM

## 2020-03-05 DIAGNOSIS — Z9861 Coronary angioplasty status: Secondary | ICD-10-CM | POA: Diagnosis not present

## 2020-03-05 DIAGNOSIS — Q231 Congenital insufficiency of aortic valve: Secondary | ICD-10-CM | POA: Diagnosis not present

## 2020-03-05 DIAGNOSIS — E119 Type 2 diabetes mellitus without complications: Secondary | ICD-10-CM | POA: Diagnosis not present

## 2020-03-05 DIAGNOSIS — Z7901 Long term (current) use of anticoagulants: Secondary | ICD-10-CM

## 2020-03-05 DIAGNOSIS — K922 Gastrointestinal hemorrhage, unspecified: Secondary | ICD-10-CM

## 2020-03-05 DIAGNOSIS — D649 Anemia, unspecified: Secondary | ICD-10-CM

## 2020-03-05 DIAGNOSIS — Z23 Encounter for immunization: Secondary | ICD-10-CM | POA: Diagnosis not present

## 2020-03-05 DIAGNOSIS — Q230 Congenital stenosis of aortic valve: Secondary | ICD-10-CM | POA: Diagnosis not present

## 2020-03-05 LAB — POCT INR: INR: 1.7 — AB (ref 2.0–3.0)

## 2020-03-05 NOTE — Progress Notes (Signed)
New Patient Office Visit  Subjective:  Patient ID: Dennis Bates, male    DOB: [birth year]  Age: 59 y.o. MRN: [id number]  CC:  Chief Complaint  Patient presents with   New Patient (Initial Visit)    HPI [name] presents for establishing care.  He has cardiac history of acute MI either 2015 or 16.  He had previous aortic valve replacement and ascending aortic aneurysm repair.  He is on chronic Coumadin.  He apparently gets his Coumadin monitored through some type of home service.  He would like to establish care here for that.  He lives down in [place]  Type 2 diabetes.  Last A1c recently back in May 5.9%.  Maintained on Metformin for that.  He does get yearly eye exams.  Does not have any history of known neuropathy issues  History of osteoarthritis.  Previous right total knee replacement couple years ago.  History of recent anemia and suspected GI bleed.  Has had full work-up with colonoscopy, EGD, and pill endoscopy with no clear etiology.  He has scheduled follow-up at [facility] soon for double-balloon enteroscopy.  Most recent hemoglobin 8.3.  He has had some mild lightheadedness and fatigue which he knows is probably related.  His hemoglobin had gotten as low as 5 prior to recent admission..  He has not noted any recent melena.  No hematemesis.  No abdominal pain.  Social history is that he is married and has 3 children and 4 grandchildren.  Been married 34 years.  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  Never smoked.  He did have episode recently of transient decreased vision left eye.  This was evaluated with MR angiogram of the head and neck which did not show any ICA stenosis and no evidence for acute stroke.  He has not had any recurrent symptoms since then.  No recent slurred speech.  No focal weakness.  Past Medical History:  Diagnosis Date   Aortic stenosis due to bicuspid aortic valve    Severe aortic stenosis of bicuspid valve  - s/pAVR with Bentall ( 09/2015)   Bell's palsy    LAST EPISODE 09-2015   CAD S/P percutaneous coronary angioplasty 09/20/2014   a. inferolat STEMI ->> LHC-Angio: Proximal/ostial LAD 20%, OM2 99% -->> PCI: 40mm x 16 mm Promus Premier DES to the OM2.(~3.5 mm)   Diabetes mellitus type 2, controlled, with complications (HCC)    Hyperlipidemia    Hypertension    Hypertriglyceridemia    ST elevation myocardial infarction (STEMI) of inferior wall (HCC) 09/20/2014   99 % occluded Cx-OM2 Promus DES 3.0 mm x 16 mm (3.5 mm)    Past Surgical History:  Procedure Laterality Date   BENTALL PROCEDURE N/A 10/09/2015   Procedure: BENTALL PROCEDURE (using a St Jude mechanical valve, size 23);  Surgeon: Gaye Pollack, MD;  Location: [facility];  Service: Open Heart Surgery;  Laterality: N/A;  CIRC ARRESTNEEDS RIGHT RADIAL A-LINE   COLONOSCOPY WITH PROPOFOL N/A 01/21/2020   Procedure: COLONOSCOPY WITH PROPOFOL;  Surgeon: Jackquline Denmark, MD;  Location: [facility] ENDOSCOPY;  Service: Endoscopy;  Laterality: N/A;   ENTEROSCOPY N/A 01/24/2020   Procedure: ENTEROSCOPY;  Surgeon: Doran Stabler, MD;  Location: [facility] ENDOSCOPY;  Service: Gastroenterology;  Laterality: N/A;   ESOPHAGOGASTRODUODENOSCOPY (EGD) WITH PROPOFOL N/A 01/21/2020   Procedure: ESOPHAGOGASTRODUODENOSCOPY (EGD) WITH PROPOFOL;  Surgeon: Jackquline Denmark, MD;  Location: [facility] ENDOSCOPY;  Service: Endoscopy;  Laterality: N/A;   GIVENS CAPSULE STUDY  N/A 01/22/2020   Procedure: GIVENS CAPSULE STUDY;  Surgeon: Sherrilyn Rist, MD;  Location: [facility] ENDOSCOPY;  Service: Gastroenterology;  Laterality: N/A;   KNEE ARTHROSCOPY W/ ACL RECONSTRUCTION Right    90's; "2 ATHROSCOPIC , 2 REBUILDS"   LEFT HEART CATHETERIZATION WITH CORONARY ANGIOGRAM N/A 09/20/2014   Procedure: LEFT HEART CATHETERIZATION WITH CORONARY ANGIOGRAM;  Surgeon: Marykay Lex, MD;  Location: [facility] CATH LAB;  Proximal/ostial LAD 20%, OM2 99% -->> aspiration thrombectomy and DES PCI:    PERCUTANEOUS  CORONARY STENT INTERVENTION (PCI-S)  09/20/2014   Aspiration thrombectomy and DES PCI- OM 2(m-dCx): Promus Premier DES 3.0 mm x 16 mm (3.5 mm)   RIGHT/LEFT HEART CATH AND CORONARY ANGIOGRAPHY  09/2015   Dr. Nicki Guadalajara: Widely patent OM 2 stent.  Mild disease in RCA and LAD.Relatively normal right heart cath pressures.  Normal cardiac output.   TEE WITHOUT CARDIOVERSION N/A 10/09/2015   Procedure: TRANSESOPHAGEAL ECHOCARDIOGRAM (TEE);  Surgeon: Alleen Borne, MD;  Location: [facility] OR;  Service: Open Heart Surgery;  Laterality: N/A;   TOTAL KNEE ARTHROPLASTY Right 12/09/2017   Procedure: RIGHT TOTAL KNEE ARTHROPLASTY REMOVAL OF HARDWARE RIGHT KNEE;  Surgeon: Jodi Geralds, MD;  Location: [facility] ORS;  Service: Orthopedics;  Laterality: Right;   TRANSTHORACIC ECHOCARDIOGRAM  02/2016   Post AVR: mechanical: AVR without obstuction. LVEF improved to 65-70%.   TRANSTHORACIC ECHOCARDIOGRAM  January 2016, February2016   Probable bicuspid aortic valve: a. mod-sev by 2D ECHO 09/2014. AVA 0.9cm^2; b) 10/2014 Echo:. Severe AS Mn-Pk Gradient 41-71 mmHg, AVA ~0.79 cm2, EF 60-65%    Family History  Problem Relation Age of Onset   Heart disease Mother    CAD Neg Hx        per wife    Social History   Socioeconomic History   Marital status: Married    Spouse name: Not on file   Number of children: Not on file   Years of education: Not on file   Highest education level: Not on file  Occupational History   Not on file  Tobacco Use   Smoking status: Never Smoker   Smokeless tobacco: Former Neurosurgeon    Types: Associate Professor Use: Never used  Substance and Sexual Activity   Alcohol use: Yes    Alcohol/week: 0.0 standard drinks    Comment: socially   Drug use: No   Sexual activity: Not on file  Other Topics Concern   Not on file  Social History Narrative   Not on file   Social Determinants of Health   Financial Resource Strain:    Difficulty of Paying Living Expenses:     Food Insecurity:    Worried About Programme researcher, broadcasting/film/video in the Last Year:    Barista in the Last Year:   Transportation Needs:    Freight forwarder (Medical):    Lack of Transportation (Non-Medical):   Physical Activity:    Days of Exercise per Week:    Minutes of Exercise per Session:   Stress:    Feeling of Stress :   Social Connections:    Frequency of Communication with Friends and Family:    Frequency of Social Gatherings with Friends and Family:    Attends Religious Services:    Active Member of Clubs or Organizations:    Attends Banker Meetings:    Marital Status:   Intimate Partner Violence:    Fear of Current or Ex-Partner:  Emotionally Abused:    Physically Abused:    Sexually Abused:     ROS Review of Systems  Constitutional: Positive for fatigue.  Eyes: Negative for visual disturbance.  Respiratory: Negative for cough, chest tightness and shortness of breath.   Cardiovascular: Negative for chest pain, palpitations and leg swelling.  Gastrointestinal: Negative for abdominal pain, nausea and vomiting.  Neurological: Negative for dizziness, syncope, weakness, light-headedness and headaches.    Objective:   Today's Vitals: BP 114/60 (BP Location: Left Arm, Patient Position: Sitting, Cuff Size: Normal)    Pulse 85    Temp 97.6 F (36.4 C) (Temporal)    Wt 179 lb 9.6 oz (81.5 kg)    SpO2 98%    BMI 28.13 kg/m   Physical Exam Constitutional:      Appearance: He is well-developed.  HENT:     Right Ear: External ear normal.     Left Ear: External ear normal.  Eyes:     Pupils: Pupils are equal, round, and reactive to light.  Neck:     Thyroid: No thyromegaly.  Cardiovascular:     Rate and Rhythm: Normal rate and regular rhythm.  Pulmonary:     Effort: Pulmonary effort is normal. No respiratory distress.     Breath sounds: Normal breath sounds. No wheezing or rales.  Musculoskeletal:     Cervical back: Neck  supple.  Neurological:     Mental Status: He is alert and oriented to person, place, and time.     Assessment & Plan:   #1 history of CAD.  Has had previous MI, aortic valve replacement, and ascending aortic aneurysm repair.  Is on chronic Coumadin therapy secondary to mechanical valve -Continue at least monthly follow-up of his INR's  #2 type 2 diabetes.  Good control by recent A1c 5.9%. -Recommend follow-up within 3 months to reassess  #3 osteoarthritis involving multiple joints.  Past history of right total knee replacement  #4 GI blood loss anemia with previous evaluation positive only for small duodenal AVM.  He has had recent iron infusion and recent iron levels were normal.  Sigmoid diverticulosis noted on colonoscopy recently but no active diverticulosis bleed -Pending evaluation at [facility] for consideration for double-balloon enteroscopy  #5 health maintenance.  We do not see any record of prior Pneumovax and given his cardiac history recommended that.  We recommend he consider complete physical at some point this year after his anemia is stabilized  Outpatient Encounter Medications as of 03/05/2020  Medication Sig   acetaminophen (TYLENOL) 325 MG tablet Take 2 tablets (650 mg total) by mouth every 6 (six) hours as needed for mild pain.   ASPIRIN 81 PO Take 1 tablet by mouth daily.    carvedilol (COREG) 3.125 MG tablet Take 1 tablet (3.125 mg total) by mouth 2 (two) times daily.   ferrous sulfate 325 (65 FE) MG tablet Take 325 mg by mouth 2 (two) times daily with a meal.   gemfibrozil (LOPID) 600 MG tablet Take 600 mg by mouth in the morning and at bedtime.   metFORMIN (GLUCOPHAGE) 1000 MG tablet Take 1,000 mg by mouth 2 (two) times daily with a meal.    rosuvastatin (CRESTOR) 20 MG tablet Take 20 mg by mouth at bedtime.   warfarin (COUMADIN) 10 MG tablet Take 10 mg by mouth daily.   No facility-administered encounter medications on file as of 03/05/2020.     Follow-up: Return in about 6 months (around 09/04/2020).   Evelena Peat, MD

## 2020-03-05 NOTE — Telephone Encounter (Signed)
Received an urgent new hem referral from Dr. Orvan Falconer for anemia. Mr. Rowand has been cld and scheduled to see Dr. Pamelia Hoit on 6/24 at 4:15pm. Pt aware to arrive 15 minutes early.

## 2020-03-05 NOTE — Progress Notes (Signed)
[facility] CONSULT NOTE  Patient Care Team: Eulas Post, MD as PCP - General (Family Medicine) Leonie Man, MD as PCP - Cardiology (Cardiology)  CHIEF COMPLAINTS/PURPOSE OF CONSULTATION:  Severe anemia  HISTORY OF PRESENTING ILLNESS:  Dennis Bates 59 y.o. male is here because of recent diagnosis of anemia. He is referred by Dr. Tarri Glenn. Labs on 03/04/20 showed Hg 8.3, HCT 24.1, platelets 301, ferritin 48.6, iron 88. He presents to the clinic today for initial evaluation.  He has a longstanding history of diabetes and valvular heart disease with prosthetic valves for which she takes Coumadin.  He was recently hospitalized with a GI bleed and was given IV iron along with blood transfusions.  At the time when he left the hospital his hemoglobin was about 10 g and 2 days ago it had come down to 8.3.  He reports that he feels short of breath with minimal exertion.  He does not have any dizziness or lightheadedness.  I reviewed his records extensively and collaborated the history with the patient.  MEDICAL HISTORY:  Past Medical History:  Diagnosis Date  . Aortic stenosis due to bicuspid aortic valve    Severe aortic stenosis of bicuspid valve - s/pAVR with Bentall ( 09/2015)  . Bell's palsy    LAST EPISODE 09-2015  . CAD S/P percutaneous coronary angioplasty 09/20/2014   a. inferolat STEMI ->> LHC-Angio: Proximal/ostial LAD 20%, OM2 99% -->> PCI: 64m x 16 mm Promus Premier DES to the OM2.(~3.5 mm)  . Diabetes mellitus type 2, controlled, with complications ([facility]   . Hyperlipidemia   . Hypertension   . Hypertriglyceridemia   . ST elevation myocardial infarction (STEMI) of inferior wall (HCC) 09/20/2014   99 % occluded Cx-OM2 Promus DES 3.0 mm x 16 mm (3.5 mm)    SURGICAL HISTORY: Past Surgical History:  Procedure Laterality Date  . BENTALL PROCEDURE N/A 10/09/2015   Procedure: BENTALL PROCEDURE (using a St Jude mechanical valve, size 23);  Surgeon: BGaye Pollack MD;  [place] [facility]  Service: Open Heart Surgery;  Laterality: N/A;  CIRC ARRESTNEEDS RIGHT RADIAL A-LINE  . COLONOSCOPY WITH PROPOFOL N/A 01/21/2020   Procedure: COLONOSCOPY WITH PROPOFOL;  Surgeon: GJackquline Denmark MD;  [facility];  Service: Endoscopy;  Laterality: N/A;  . ENTEROSCOPY N/A 01/24/2020   Procedure: ENTEROSCOPY;  Surgeon: DDoran Stabler MD;  [facility];  Service: Gastroenterology;  Laterality: N/A;  . ESOPHAGOGASTRODUODENOSCOPY (EGD) WITH PROPOFOL N/A 01/21/2020   Procedure: ESOPHAGOGASTRODUODENOSCOPY (EGD) WITH PROPOFOL;  Surgeon: GJackquline Denmark MD;  [facility];  Service: Endoscopy;  Laterality: N/A;  . GIVENS CAPSULE STUDY N/A 01/22/2020   Procedure: GIVENS CAPSULE STUDY;  Surgeon: DDoran Stabler MD;  [place] [facility]  Service: Gastroenterology;  Laterality: N/A;  . KNEE ARTHROSCOPY W/ ACL RECONSTRUCTION Right    90's; "2 ATHROSCOPIC , 2 REBUILDS"  . LEFT HEART CATHETERIZATION WITH CORONARY ANGIOGRAM N/A 09/20/2014   Procedure: LEFT HEART CATHETERIZATION WITH CORONARY ANGIOGRAM;  Surgeon: DLeonie Man MD;  [facility];  Proximal/ostial LAD 20%, OM2 99% -->> aspiration thrombectomy and DES PCI:   . PERCUTANEOUS CORONARY STENT INTERVENTION (PCI-S)  09/20/2014   Aspiration thrombectomy and DES PCI- OM 2(m-dCx): Promus Premier DES 3.0 mm x 16 mm (3.5 mm)  . RIGHT/LEFT HEART CATH AND CORONARY ANGIOGRAPHY  09/2015   Dr. TShelva Majestic Widely patent OM 2 stent.  Mild disease in RCA and LAD.Relatively normal right heart cath pressures.  Normal cardiac output.  . TEE WITHOUT CARDIOVERSION N/A 10/09/2015   Procedure: TRANSESOPHAGEAL ECHOCARDIOGRAM (TEE);  Surgeon: Gaye Pollack, MD;  Location: [facility];  Service: Open Heart Surgery;  Laterality: N/A;  . TOTAL KNEE ARTHROPLASTY Right 12/09/2017   Procedure: RIGHT TOTAL KNEE ARTHROPLASTY REMOVAL OF HARDWARE RIGHT KNEE;  Surgeon: Dorna Leitz, MD;  Location: [facility];  Service:  Orthopedics;  Laterality: Right;  . TRANSTHORACIC ECHOCARDIOGRAM  02/2016   Post AVR: mechanical: AVR without obstuction. LVEF improved to 65-70%.  Domingo Dimes ECHOCARDIOGRAM  January 2016, February2016   Probable bicuspid aortic valve: a. mod-sev by 2D ECHO 09/2014. AVA 0.9cm^2; b) 10/2014 Echo:. Severe AS Mn-Pk Gradient 41-71 mmHg, AVA ~0.79 cm2, EF 60-65%    SOCIAL HISTORY: Social History   Socioeconomic History  . Marital status: Married    Spouse name: Not on file  . Number of children: Not on file  . Years of education: Not on file  . Highest education level: Not on file  Occupational History  . Not on file  Tobacco Use  . Smoking status: Never Smoker  . Smokeless tobacco: Former Systems developer    Types: Secondary school teacher  . Vaping Use: Never used  Substance and Sexual Activity  . Alcohol use: Yes    Alcohol/week: 0.0 standard drinks    Comment: socially  . Drug use: No  . Sexual activity: Not on file  Other Topics Concern  . Not on file  Social History Narrative  . Not on file   Social Determinants of Health   Financial Resource Strain:   . Difficulty of Paying Living Expenses:   Food Insecurity:   . Worried About Charity fundraiser in the Last Year:   . Arboriculturist in the Last Year:   Transportation Needs:   . Film/video editor (Medical):   Marland Kitchen Lack of Transportation (Non-Medical):   Physical Activity:   . Days of Exercise per Week:   . Minutes of Exercise per Session:   Stress:   . Feeling of Stress :   Social Connections:   . Frequency of Communication with Friends and Family:   . Frequency of Social Gatherings with Friends and Family:   . Attends Religious Services:   . Active Member of Clubs or Organizations:   . Attends Archivist Meetings:   Marland Kitchen Marital Status:   Intimate Partner Violence:   . Fear of Current or Ex-Partner:   . Emotionally Abused:   Marland Kitchen Physically Abused:   . Sexually Abused:     FAMILY HISTORY: Family History    Problem Relation Age of Onset  . Heart disease Mother   . CAD Neg Hx        per wife    ALLERGIES:  has No Known Allergies.  MEDICATIONS:  Current Outpatient Medications  Medication Sig Dispense Refill  . acetaminophen (TYLENOL) 325 MG tablet Take 2 tablets (650 mg total) by mouth every 6 (six) hours as needed for mild pain.    . ASPIRIN 81 PO Take 1 tablet by mouth daily.     . carvedilol (COREG) 3.125 MG tablet Take 1 tablet (3.125 mg total) by mouth 2 (two) times daily. 60 tablet 3  . ferrous sulfate 325 (65 FE) MG tablet Take 325 mg by mouth 2 (two) times daily with a meal.    . gemfibrozil (LOPID) 600 MG tablet Take 600 mg by mouth in the morning and at bedtime.    . metFORMIN (  GLUCOPHAGE) 1000 MG tablet Take 1,000 mg by mouth 2 (two) times daily with a meal.     . rosuvastatin (CRESTOR) 20 MG tablet Take 20 mg by mouth at bedtime.    Marland Kitchen warfarin (COUMADIN) 10 MG tablet Take 10 mg by mouth daily.     No current facility-administered medications for this visit.    REVIEW OF SYSTEMS:   Constitutional: Denies fevers, chills or abnormal night sweats Eyes: Denies blurriness of vision, double vision or watery eyes Ears, nose, mouth, throat, and face: Denies mucositis or sore throat Respiratory: Denies cough, dyspnea or wheezes Cardiovascular: Denies palpitation, chest discomfort or lower extremity swelling Gastrointestinal:  Denies nausea, heartburn or change in bowel habits Skin: Denies abnormal skin rashes Lymphatics: Denies new lymphadenopathy or easy bruising Neurological:Denies numbness, tingling or new weaknesses Behavioral/Psych: Mood is stable, no new changes  All other systems were reviewed with the patient and are negative.  PHYSICAL EXAMINATION: ECOG PERFORMANCE STATUS: 1 - Symptomatic but completely ambulatory  Vitals:   03/06/20 1306  BP: 124/79  Pulse: 83  Resp: (!) 22  Temp: 98.9 F (37.2 C)  SpO2: 100%   Filed Weights   03/06/20 1306  Weight: 178 lb  (80.7 kg)    GENERAL:alert, no distress and comfortable SKIN: skin color, texture, turgor are normal, no rashes or significant lesions EYES: normal, conjunctiva are pink and non-injected, sclera clear OROPHARYNX:no exudate, no erythema and lips, buccal mucosa, and tongue normal  NECK: supple, thyroid normal size, non-tender, without nodularity LYMPH:  no palpable lymphadenopathy in the cervical, axillary or inguinal LUNGS: clear to auscultation and percussion with normal breathing effort HEART: regular rate & rhythm and no murmurs and no lower extremity edema ABDOMEN:abdomen soft, non-tender and normal bowel sounds Musculoskeletal:no cyanosis of digits and no clubbing  PSYCH: alert & oriented x 3 with fluent speech NEURO: no focal motor/sensory deficits   LABORATORY DATA:  I have reviewed the data as listed Lab Results  Component Value Date   WBC 5.5 03/04/2020   HGB 8.3 Repeated and verified X2. (L) 03/04/2020   HCT 24.1 Repeated and verified X2. (L) 03/04/2020   MCV 89.4 03/04/2020   PLT 301.0 03/04/2020   Lab Results  Component Value Date   NA 138 01/25/2020   K 4.5 01/25/2020   CL 104 01/25/2020   CO2 26 01/25/2020    RADIOGRAPHIC STUDIES: I have personally reviewed the radiological reports and agreed with the findings in the report.  ASSESSMENT AND PLAN:  Iron deficiency anemia 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  Lab review: 03/04/2020: Hemoglobin 8.3, MCV 89.4, RDW 19.2, ferritin 88 01/25/2020: Hemoglobin 9.6, MCV 88.9, RDW 16.4, ferritin 33 Ferritin was 6 on 01/19/2020  Treatment plan: 1.  Multifactorial anemia: Iron deficiency anemia due to chronic GI blood loss from anticoagulation along with anemia of chronic disease. For  completion I would like to send for serum protein electrophoresis and erythropoietin levels We will also rule out hemolysis with haptoglobin, LDH, reticulocyte count We will also check for iron studies H88 and folic acid.  Blood transfusion support will be necessary if the hemoglobin is below 8  Bone marrow biopsy may also be needed if the hemoglobin does not recover over the next month. Telephone visit tomorrow to discuss the results of  the blood work from today.    All questions were answered. The patient knows to call the clinic with any problems, questions or concerns.   Rulon Eisenmenger, MD, MPH 03/06/2020    I, Molly Dorshimer, am acting as scribe for Nicholas Lose, MD.  I have reviewed the above documentation for accuracy and completeness, and I agree with the above.

## 2020-03-05 NOTE — Telephone Encounter (Signed)
Pt aware.

## 2020-03-05 NOTE — Patient Instructions (Signed)
Pre visit review using our clinic review tool, if applicable. No additional management support is needed unless otherwise documented below in the visit note.  Take 15 mg today and tomorrow and then continue to take 10 mg daily.  This is a new patient for [facility].    Patient has a referral to a hematologist in the near future.  Check INR on 7/6 @ [facility].

## 2020-03-05 NOTE — Telephone Encounter (Signed)
Pt states his PCP office spoke with him and would like to know what Dr. Orvan Falconer would like his INR to be given his continued anemia/bleeding issues. Please advise.

## 2020-03-05 NOTE — Telephone Encounter (Signed)
I personally spoke with Dr. Caryl Never by phone this afternoon. He already has a plan in place for warfarin use/goals. I have no new recommendations. Thank you.

## 2020-03-06 ENCOUNTER — Inpatient Hospital Stay: Payer: BC Managed Care – PPO

## 2020-03-06 ENCOUNTER — Telehealth: Payer: Self-pay | Admitting: Hematology and Oncology

## 2020-03-06 ENCOUNTER — Inpatient Hospital Stay: Payer: BC Managed Care – PPO | Attending: Hematology and Oncology | Admitting: Hematology and Oncology

## 2020-03-06 ENCOUNTER — Other Ambulatory Visit: Payer: Self-pay

## 2020-03-06 DIAGNOSIS — E119 Type 2 diabetes mellitus without complications: Secondary | ICD-10-CM | POA: Diagnosis not present

## 2020-03-06 DIAGNOSIS — Z7982 Long term (current) use of aspirin: Secondary | ICD-10-CM | POA: Insufficient documentation

## 2020-03-06 DIAGNOSIS — Z96651 Presence of right artificial knee joint: Secondary | ICD-10-CM | POA: Diagnosis not present

## 2020-03-06 DIAGNOSIS — I10 Essential (primary) hypertension: Secondary | ICD-10-CM | POA: Diagnosis not present

## 2020-03-06 DIAGNOSIS — I38 Endocarditis, valve unspecified: Secondary | ICD-10-CM | POA: Diagnosis not present

## 2020-03-06 DIAGNOSIS — Z7984 Long term (current) use of oral hypoglycemic drugs: Secondary | ICD-10-CM | POA: Diagnosis not present

## 2020-03-06 DIAGNOSIS — I252 Old myocardial infarction: Secondary | ICD-10-CM | POA: Insufficient documentation

## 2020-03-06 DIAGNOSIS — R0602 Shortness of breath: Secondary | ICD-10-CM | POA: Insufficient documentation

## 2020-03-06 DIAGNOSIS — D500 Iron deficiency anemia secondary to blood loss (chronic): Secondary | ICD-10-CM

## 2020-03-06 DIAGNOSIS — Z79899 Other long term (current) drug therapy: Secondary | ICD-10-CM | POA: Insufficient documentation

## 2020-03-06 DIAGNOSIS — D638 Anemia in other chronic diseases classified elsewhere: Secondary | ICD-10-CM | POA: Insufficient documentation

## 2020-03-06 DIAGNOSIS — Z952 Presence of prosthetic heart valve: Secondary | ICD-10-CM | POA: Insufficient documentation

## 2020-03-06 DIAGNOSIS — K5521 Angiodysplasia of colon with hemorrhage: Secondary | ICD-10-CM | POA: Diagnosis not present

## 2020-03-06 DIAGNOSIS — E785 Hyperlipidemia, unspecified: Secondary | ICD-10-CM | POA: Insufficient documentation

## 2020-03-06 DIAGNOSIS — Z7901 Long term (current) use of anticoagulants: Secondary | ICD-10-CM | POA: Diagnosis not present

## 2020-03-06 LAB — FERRITIN: Ferritin: 62 ng/mL (ref 24–336)

## 2020-03-06 LAB — FOLATE: Folate: 14.5 ng/mL (ref >5.9)

## 2020-03-06 LAB — CBC WITH DIFFERENTIAL (CANCER CENTER ONLY)
Abs Immature Granulocytes: 0.1 10*3/uL — ABNORMAL HIGH (ref 0.00–0.07)
Basophils Absolute: 0 10*3/uL (ref 0.0–0.1)
Basophils Relative: 1 %
Eosinophils Absolute: 0.1 10*3/uL (ref 0.0–0.5)
Eosinophils Relative: 2 %
HCT: 24.6 % — ABNORMAL LOW (ref 39.0–52.0)
Hemoglobin: 8.1 g/dL — ABNORMAL LOW (ref 13.0–17.0)
Immature Granulocytes: 2 %
Lymphocytes Relative: 16 %
Lymphs Abs: 1.1 10*3/uL (ref 0.7–4.0)
MCH: 30.9 pg (ref 26.0–34.0)
MCHC: 32.9 g/dL (ref 30.0–36.0)
MCV: 93.9 fL (ref 80.0–100.0)
Monocytes Absolute: 0.5 10*3/uL (ref 0.1–1.0)
Monocytes Relative: 8 %
Neutro Abs: 4.9 10*3/uL (ref 1.7–7.7)
Neutrophils Relative %: 71 %
Platelet Count: 355 10*3/uL (ref 150–400)
RBC: 2.62 MIL/uL — ABNORMAL LOW (ref 4.22–5.81)
RDW: 18.8 % — ABNORMAL HIGH (ref 11.5–15.5)
WBC Count: 6.8 10*3/uL (ref 4.0–10.5)
nRBC: 2.1 % — ABNORMAL HIGH (ref 0.0–0.2)

## 2020-03-06 LAB — IRON AND TIBC
Iron: 204 ug/dL — ABNORMAL HIGH (ref 42–163)
Saturation Ratios: 46 % (ref 20–55)
TIBC: 444 ug/dL — ABNORMAL HIGH (ref 202–409)
UIBC: 240 ug/dL (ref 117–376)

## 2020-03-06 LAB — VITAMIN B12: Vitamin B-12: 191 pg/mL (ref 180–914)

## 2020-03-06 LAB — SAMPLE TO BLOOD BANK

## 2020-03-06 LAB — DIRECT ANTIGLOBULIN TEST (NOT AT ARMC)
DAT, IgG: NEGATIVE
DAT, complement: NEGATIVE

## 2020-03-06 LAB — LACTATE DEHYDROGENASE: LDH: 191 U/L (ref 98–192)

## 2020-03-06 LAB — TSH: TSH: 3.683 u[IU]/mL (ref 0.320–4.118)

## 2020-03-06 NOTE — Telephone Encounter (Signed)
Mr. Prestwood has been cld and rescheduled to see Dr. Pamelia Hoit today at 1pm.

## 2020-03-06 NOTE — Assessment & Plan Note (Signed)
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  Lab review: 03/04/2020: Hemoglobin 8.3, MCV 89.4, RDW 19.2, ferritin 88 01/25/2020: Hemoglobin 9.6, MCV 88.9, RDW 16.4, ferritin 33 Ferritin was 6 on 01/19/2020  Treatment plan: 1.  Multifactorial anemia: Iron deficiency anemia due to chronic GI blood loss from anticoagulation along with anemia of chronic disease. For completion I would like to send for serum protein electrophoresis and erythropoietin levels We will also rule out hemolysis with haptoglobin, LDH, reticulocyte count Blood transfusion support will be necessary if the hemoglobin is below 8  Bone marrow biopsy may also be needed if the hemoglobin does not recover over the next month. Return to me in 1 month with labs and follow-up

## 2020-03-07 LAB — HAPTOGLOBIN: Haptoglobin: 32 mg/dL (ref 29–370)

## 2020-03-07 LAB — ERYTHROPOIETIN: Erythropoietin: 168.8 m[IU]/mL — ABNORMAL HIGH (ref 2.6–18.5)

## 2020-03-10 ENCOUNTER — Other Ambulatory Visit: Payer: Self-pay | Admitting: Hematology and Oncology

## 2020-03-10 ENCOUNTER — Other Ambulatory Visit: Payer: Self-pay | Admitting: *Deleted

## 2020-03-10 DIAGNOSIS — D500 Iron deficiency anemia secondary to blood loss (chronic): Secondary | ICD-10-CM

## 2020-03-10 LAB — MULTIPLE MYELOMA PANEL, SERUM
Albumin SerPl Elph-Mcnc: 4.2 g/dL (ref 2.9–4.4)
Albumin/Glob SerPl: 1.6 (ref 0.7–1.7)
Alpha 1: 0.3 g/dL (ref 0.0–0.4)
Alpha2 Glob SerPl Elph-Mcnc: 0.7 g/dL (ref 0.4–1.0)
B-Globulin SerPl Elph-Mcnc: 1.2 g/dL (ref 0.7–1.3)
Gamma Glob SerPl Elph-Mcnc: 0.5 g/dL (ref 0.4–1.8)
Globulin, Total: 2.7 g/dL (ref 2.2–3.9)
IgA: 151 mg/dL (ref 90–386)
IgG (Immunoglobin G), Serum: 451 mg/dL — ABNORMAL LOW (ref 603–1613)
IgM (Immunoglobulin M), Srm: 29 mg/dL (ref 20–172)
Total Protein ELP: 6.9 g/dL (ref 6.0–8.5)

## 2020-03-11 ENCOUNTER — Telehealth: Payer: Self-pay | Admitting: Gastroenterology

## 2020-03-11 ENCOUNTER — Telehealth: Payer: Self-pay | Admitting: Hematology and Oncology

## 2020-03-11 NOTE — Telephone Encounter (Signed)
Pt called and wants to know what the next step will be for his care. He saw Dr. Pamelia Hoit and was told to stop the po Iron and to take B12, he would need transfusion support for Hgb 8 or less. Last Hgb with [place] was 8.3, OV with Hematology lab Hgb was 8.1. Pt is not scheduled for another lab until 7/20 with cancer center. Pt states since stopping the iron his stools are not sticky but they are black, he is SOB with little activity and has no energy. OV note from Dr. Pamelia Hoit mentioned possible Bone marrow bx if no improvement in a month. Pt knows Dr. Orvan Falconer is off today but returns tomorrow. Please advise.

## 2020-03-11 NOTE — Telephone Encounter (Signed)
Scheduled appts per 6/28 staff msg. Left voicemail with appt date and time.

## 2020-03-12 NOTE — Telephone Encounter (Signed)
I wanted to get your input. I have referred him to [facility] for double balloon enteroscopy although his capsule endoscopy did not reveal any source of active bleeding. Do you have plans to follow his hemoglobin or proceed with further evaluation in the meantime?

## 2020-03-13 ENCOUNTER — Other Ambulatory Visit: Payer: Self-pay | Admitting: *Deleted

## 2020-03-13 ENCOUNTER — Telehealth: Payer: Self-pay | Admitting: Hematology and Oncology

## 2020-03-13 DIAGNOSIS — D500 Iron deficiency anemia secondary to blood loss (chronic): Secondary | ICD-10-CM

## 2020-03-13 NOTE — Telephone Encounter (Signed)
Called per 6/29 sch message - unable to reach pt .left message for patient with appt date and time

## 2020-03-14 ENCOUNTER — Inpatient Hospital Stay: Payer: BC Managed Care – PPO | Attending: Hematology and Oncology

## 2020-03-14 ENCOUNTER — Telehealth: Payer: Self-pay | Admitting: Gastroenterology

## 2020-03-14 ENCOUNTER — Inpatient Hospital Stay (HOSPITAL_BASED_OUTPATIENT_CLINIC_OR_DEPARTMENT_OTHER): Payer: BC Managed Care – PPO | Admitting: Medical

## 2020-03-14 ENCOUNTER — Telehealth: Payer: Self-pay | Admitting: *Deleted

## 2020-03-14 ENCOUNTER — Other Ambulatory Visit: Payer: Self-pay

## 2020-03-14 ENCOUNTER — Other Ambulatory Visit: Payer: Self-pay | Admitting: Emergency Medicine

## 2020-03-14 ENCOUNTER — Telehealth: Payer: Self-pay | Admitting: Medical Oncology

## 2020-03-14 VITALS — BP 96/62 | HR 76 | Temp 98.0°F | Resp 18 | Ht 67.0 in | Wt 177.1 lb

## 2020-03-14 DIAGNOSIS — I252 Old myocardial infarction: Secondary | ICD-10-CM | POA: Diagnosis not present

## 2020-03-14 DIAGNOSIS — K922 Gastrointestinal hemorrhage, unspecified: Secondary | ICD-10-CM | POA: Diagnosis not present

## 2020-03-14 DIAGNOSIS — Z7901 Long term (current) use of anticoagulants: Secondary | ICD-10-CM | POA: Diagnosis not present

## 2020-03-14 DIAGNOSIS — Z952 Presence of prosthetic heart valve: Secondary | ICD-10-CM | POA: Diagnosis not present

## 2020-03-14 DIAGNOSIS — Z7984 Long term (current) use of oral hypoglycemic drugs: Secondary | ICD-10-CM | POA: Diagnosis not present

## 2020-03-14 DIAGNOSIS — D500 Iron deficiency anemia secondary to blood loss (chronic): Secondary | ICD-10-CM

## 2020-03-14 DIAGNOSIS — I10 Essential (primary) hypertension: Secondary | ICD-10-CM | POA: Diagnosis not present

## 2020-03-14 DIAGNOSIS — Z79899 Other long term (current) drug therapy: Secondary | ICD-10-CM | POA: Insufficient documentation

## 2020-03-14 DIAGNOSIS — E119 Type 2 diabetes mellitus without complications: Secondary | ICD-10-CM | POA: Diagnosis not present

## 2020-03-14 LAB — CBC WITH DIFFERENTIAL (CANCER CENTER ONLY)
Abs Immature Granulocytes: 0.02 10*3/uL (ref 0.00–0.07)
Basophils Absolute: 0 10*3/uL (ref 0.0–0.1)
Basophils Relative: 1 %
Eosinophils Absolute: 0.1 10*3/uL (ref 0.0–0.5)
Eosinophils Relative: 2 %
HCT: 16.8 % — ABNORMAL LOW (ref 39.0–52.0)
Hemoglobin: 5.2 g/dL — CL (ref 13.0–17.0)
Immature Granulocytes: 1 %
Lymphocytes Relative: 16 %
Lymphs Abs: 0.6 10*3/uL — ABNORMAL LOW (ref 0.7–4.0)
MCH: 28.1 pg (ref 26.0–34.0)
MCHC: 31 g/dL (ref 30.0–36.0)
MCV: 90.8 fL (ref 80.0–100.0)
Monocytes Absolute: 0.2 10*3/uL (ref 0.1–1.0)
Monocytes Relative: 6 %
Neutro Abs: 2.6 10*3/uL (ref 1.7–7.7)
Neutrophils Relative %: 74 %
Platelet Count: 362 10*3/uL (ref 150–400)
RBC: 1.85 MIL/uL — ABNORMAL LOW (ref 4.22–5.81)
RDW: 15.9 % — ABNORMAL HIGH (ref 11.5–15.5)
WBC Count: 3.5 10*3/uL — ABNORMAL LOW (ref 4.0–10.5)
nRBC: 0 % (ref 0.0–0.2)

## 2020-03-14 LAB — CMP (CANCER CENTER ONLY)
ALT: 13 U/L (ref 0–44)
AST: 11 U/L — ABNORMAL LOW (ref 15–41)
Albumin: 3.9 g/dL (ref 3.5–5.0)
Alkaline Phosphatase: 30 U/L — ABNORMAL LOW (ref 38–126)
Anion gap: 12 (ref 5–15)
BUN: 13 mg/dL (ref 6–20)
CO2: 19 mmol/L — ABNORMAL LOW (ref 22–32)
Calcium: 9.3 mg/dL (ref 8.9–10.3)
Chloride: 105 mmol/L (ref 98–111)
Creatinine: 0.92 mg/dL (ref 0.61–1.24)
GFR, Est AFR Am: >60 mL/min (ref >60)
GFR, Estimated: >60 mL/min (ref >60)
Glucose, Bld: 357 mg/dL — ABNORMAL HIGH (ref 70–99)
Potassium: 4.3 mmol/L (ref 3.5–5.1)
Sodium: 136 mmol/L (ref 135–145)
Total Bilirubin: 0.6 mg/dL (ref 0.3–1.2)
Total Protein: 6.5 g/dL (ref 6.5–8.1)

## 2020-03-14 LAB — ABO/RH: ABO/RH(D): AB POS

## 2020-03-14 LAB — PREPARE RBC (CROSSMATCH)

## 2020-03-14 LAB — SAMPLE TO BLOOD BANK

## 2020-03-14 MED ORDER — SODIUM CHLORIDE 0.9% IV SOLUTION
250.0000 mL | Freq: Once | INTRAVENOUS | Status: AC
  Administered 2020-03-14: 250 mL via INTRAVENOUS
  Filled 2020-03-14: qty 250

## 2020-03-14 MED ORDER — ACETAMINOPHEN 325 MG PO TABS
650.0000 mg | ORAL_TABLET | Freq: Once | ORAL | Status: AC
  Administered 2020-03-14: 650 mg via ORAL

## 2020-03-14 NOTE — Telephone Encounter (Signed)
Left message for pts wife to call back. 

## 2020-03-14 NOTE — Telephone Encounter (Signed)
CRITICAL VALUE STICKER  CRITICAL VALUE: hgb 5.2  RECEIVER (on-site recipient of call):Dennis Bates  DATE & TIME NOTIFIED: 03/14/2020 1056  MESSENGER (representative from lab):  MD NOTIFIED: Mena Pauls , RN - TIME OF NOTIFICATION:03/14/20 1058  RESPONSE: Pt is scheduled for 2  Units of blood today.

## 2020-03-14 NOTE — Patient Instructions (Signed)

## 2020-03-14 NOTE — Progress Notes (Signed)
Pt received 2 units PRBCs today, tolerated well.  VSS.  Able to eat/drink and ambulate to restroom during tx w/out any issues.  Reports feeling better at end of tx.  Pt aware to return on 03/18/20 for f/u with [facility] and to call back beforehand with any questions/concerns.

## 2020-03-14 NOTE — Telephone Encounter (Signed)
Spoke with pts wife and let her know the hospitalist would decide if he needed to be admitted and if so they can call for a Gi consult.

## 2020-03-14 NOTE — Telephone Encounter (Signed)
This RN was notified that the pt came in for lab " and feels like he is going to pass out ".  Pt was put in wheelchair.  Noted pt had lab last week with low heme of 8.1 with need for blood transfusion- and was scheduled for next open slot on Saturday 7/3.  This RN noted [facility] per above for need for evaluation before pt leaves due to current symptoms.  This RN went to the lobby and spoke with the patient who states severe weakness as well as " my head is in a fog " and was actually mildly emotional - and apologizing.  This RN reassured pt of need for further evaluation before we let him leave and that he needs to stay in the wheelchair and a nurse will be getting him after he has his blood drawn for him to be seen by a provider.  Pt verbalized understanding.

## 2020-03-14 NOTE — Progress Notes (Signed)
Symptoms Management Clinic Progress Note   Dennis Bates [id number] [birth year] 59 y.o.  Dennis Bates is managed by Dr. Serena CroissantVinay Gudena  Actively treated with chemotherapy/immunotherapy/hormonal therapy: no  Next scheduled appointment with provider: 04/01/2020  Assessment: Plan:    Gastrointestinal hemorrhage, unspecified gastrointestinal hemorrhage type  Iron deficiency anemia due to chronic blood loss - Plan: Informed Consent Details: Physician/Practitioner Attestation; Transcribe to consent form and obtain patient signature, Care order/instruction, Type and screen, acetaminophen (TYLENOL) tablet 650 mg, Transfuse RBC, Prepare RBC (crossmatch), 0.9 %  sodium chloride infusion (Manually program via Guardrails IV Fluids), Prepare RBC (crossmatch), Transfuse RBC, Transfuse RBC  Long term (current) use of anticoagulants [Z79.01]   Gastrointestinal hemorrhage with anemia: The patient presents to the clinic today for labs and reported significant fatigue.  A CBC returned showing a hemoglobin of 5.2 and a hematocrit of 16.8.  He was transfused with 2 units of packed red blood cells.  He will return for follow-up on 03/18/2020.  He was instructed to present to the emergency room should he note increased melena, bright red blood per rectum, chest pain, increased shortness of breath, or significant progressive fatigue.  Anticoagulation: The patient continues on Coumadin.  His INR yesterday was 1.8.  Please see After Visit Summary for patient specific instructions.  Future Appointments  Date Time Provider Department Center  03/18/2020  8:30 AM [facility] None  03/18/2020 10:00 AM [facility] None  03/18/2020 10:30 AM Gavin Telford, Kathrin GreathouseVan E., PA-C [facility] None  04/01/2020  8:15 AM [facility] 1 [facility] None  04/01/2020  8:45 AM Serena CroissantGudena, Vinay, MD [facility]    Orders Placed This Encounter  Procedures  . Prepare RBC (crossmatch)        Subjective:   Patient ID:  Dennis Bates is a 59 y.o. (DOB [birth year]) male.  Chief Complaint:  Chief Complaint  Patient presents with  . Fatigue  . Anemia    HPI Dennis Bates is a 59 y.o. male with a diagnosis of gastrointestinal bleeding.  He was recently seen by Dr. Serena CroissantVinay Gudena.  He presented today for labs prior to a planned transfusion tomorrow.  He reports significant fatigue, dizziness, shortness of breath, episodic chest pain.  He continues on Coumadin which he takes secondary to an artificial heart valve.  His INR yesterday was 1.8.  He reports that he has stopped oral iron.  Despite this he continues to have dark stools.  He reports that they are not black and tarry but are dark.  Medications: I have reviewed the patient's current medications.  Allergies: No Known Allergies  Past Medical History:  Diagnosis Date  . Aortic stenosis due to bicuspid aortic valve    Severe aortic stenosis of bicuspid valve - s/pAVR with Bentall ( 09/2015)  . Bell's palsy    LAST EPISODE 09-2015  . CAD S/P percutaneous coronary angioplasty 09/20/2014   a. inferolat STEMI ->> LHC-Angio: Proximal/ostial LAD 20%, OM2 99% -->> PCI: 3mm x 16 mm Promus Premier DES to the OM2.(~3.5 mm)  . Diabetes mellitus type 2, controlled, with complications (HCC)   . Hyperlipidemia   . Hypertension   . Hypertriglyceridemia   . ST elevation myocardial infarction (STEMI) of inferior wall (HCC) 09/20/2014   99 % occluded Cx-OM2 Promus DES 3.0 mm x 16 mm (3.5 mm)    Past Surgical History:  Procedure Laterality Date  . BENTALL PROCEDURE N/A 10/09/2015   Procedure: BENTALL PROCEDURE (using a St Jude  mechanical valve, size 23);  Surgeon: Alleen Borne, MD;  Location: [facility] OR;  Service: Open Heart Surgery;  Laterality: N/A;  CIRC ARRESTNEEDS RIGHT RADIAL A-LINE  . COLONOSCOPY WITH PROPOFOL N/A 01/21/2020   Procedure: COLONOSCOPY WITH PROPOFOL;  Surgeon: Lynann Bologna, MD;  Location: [facility] ENDOSCOPY;  Service:  Endoscopy;  Laterality: N/A;  . ENTEROSCOPY N/A 01/24/2020   Procedure: ENTEROSCOPY;  Surgeon: Sherrilyn Rist, MD;  Location: [facility] ENDOSCOPY;  Service: Gastroenterology;  Laterality: N/A;  . ESOPHAGOGASTRODUODENOSCOPY (EGD) WITH PROPOFOL N/A 01/21/2020   Procedure: ESOPHAGOGASTRODUODENOSCOPY (EGD) WITH PROPOFOL;  Surgeon: Lynann Bologna, MD;  Location: [facility] ENDOSCOPY;  Service: Endoscopy;  Laterality: N/A;  . GIVENS CAPSULE STUDY N/A 01/22/2020   Procedure: GIVENS CAPSULE STUDY;  Surgeon: Sherrilyn Rist, MD;  Location: [facility] ENDOSCOPY;  Service: Gastroenterology;  Laterality: N/A;  . KNEE ARTHROSCOPY W/ ACL RECONSTRUCTION Right    90's; "2 ATHROSCOPIC , 2 REBUILDS"  . LEFT HEART CATHETERIZATION WITH CORONARY ANGIOGRAM N/A 09/20/2014   Procedure: LEFT HEART CATHETERIZATION WITH CORONARY ANGIOGRAM;  Surgeon: Marykay Lex, MD;  Location: [facility] CATH LAB;  Proximal/ostial LAD 20%, OM2 99% -->> aspiration thrombectomy and DES PCI:   . PERCUTANEOUS CORONARY STENT INTERVENTION (PCI-S)  09/20/2014   Aspiration thrombectomy and DES PCI- OM 2(m-dCx): Promus Premier DES 3.0 mm x 16 mm (3.5 mm)  . RIGHT/LEFT HEART CATH AND CORONARY ANGIOGRAPHY  09/2015   Dr. Nicki Guadalajara: Widely patent OM 2 stent.  Mild disease in RCA and LAD.Relatively normal right heart cath pressures.  Normal cardiac output.  . TEE WITHOUT CARDIOVERSION N/A 10/09/2015   Procedure: TRANSESOPHAGEAL ECHOCARDIOGRAM (TEE);  Surgeon: Alleen Borne, MD;  Location[facility] OR;  Service: Open Heart Surgery;  Laterality: N/A;  . TOTAL KNEE ARTHROPLASTY Right 12/09/2017   Procedure: RIGHT TOTAL KNEE ARTHROPLASTY REMOVAL OF HARDWARE RIGHT KNEE;  Surgeon: Jodi Geralds, MD;  Location: [facility] ORS;  Service: Orthopedics;  Laterality: Right;  . TRANSTHORACIC ECHOCARDIOGRAM  02/2016   Post AVR: mechanical: AVR without obstuction. LVEF improved to 65-70%.  [name] ECHOCARDIOGRAM  January 2016, February2016   Probable bicuspid aortic valve: a. mod-sev by 2D ECHO  09/2014. AVA 0.9cm^2; b) 10/2014 Echo:. Severe AS Mn-Pk Gradient 41-71 mmHg, AVA ~0.79 cm2, EF 60-65%    Family History  Problem Relation Age of Onset  . Heart disease Mother   . CAD Neg Hx        per wife    Social History   Socioeconomic History  . Marital status: Married    Spouse name: Not on file  . Number of children: Not on file  . Years of education: Not on file  . Highest education level: Not on file  Occupational History  . Not on file  Tobacco Use  . Smoking status: Never Smoker  . Smokeless tobacco: Former Neurosurgeon    Types: Engineer, drilling  . Vaping Use: Never used  Substance and Sexual Activity  . Alcohol use: Yes    Alcohol/week: 0.0 standard drinks    Comment: socially  . Drug use: No  . Sexual activity: Not on file  Other Topics Concern  . Not on file  Social History Narrative  . Not on file   Social Determinants of Health   Financial Resource Strain:   . Difficulty of Paying Living Expenses:   Food Insecurity:   . Worried About Programme researcher, broadcasting/film/video in the Last Year:   . [employer] of Food in the Last Year:  Transportation Needs:   . Freight forwarder (Medical):   Marland Kitchen Lack of Transportation (Non-Medical):   Physical Activity:   . Days of Exercise per Week:   . Minutes of Exercise per Session:   Stress:   . Feeling of Stress :   Social Connections:   . Frequency of Communication with Friends and Family:   . Frequency of Social Gatherings with Friends and Family:   . Attends Religious Services:   . Active Member of Clubs or Organizations:   . Attends Banker Meetings:   Marland Kitchen Marital Status:   Intimate Partner Violence:   . Fear of Current or Ex-Partner:   . Emotionally Abused:   Marland Kitchen Physically Abused:   . Sexually Abused:     Past Medical History, Surgical history, Social history, and Family history were reviewed and updated as appropriate.   Please see review of systems for further details on the patient's review from today.    Review of Systems:  Review of Systems  Constitutional: Positive for activity change and fatigue. Negative for chills, diaphoresis and fever.  HENT: Negative for trouble swallowing and voice change.   Respiratory: Positive for shortness of breath. Negative for cough, chest tightness and wheezing.   Cardiovascular: Positive for chest pain. Negative for palpitations.  Gastrointestinal: Negative for abdominal pain, anal bleeding, blood in stool, constipation, diarrhea, nausea and vomiting.       Dark stools.  Musculoskeletal: Negative for back pain and myalgias.  Neurological: Positive for dizziness and weakness. Negative for light-headedness and headaches.    Objective:   Physical Exam:  BP 96/62   Pulse 76   Temp 98 F (36.7 C) (Oral)   Resp 18   Ht 5\' 7"  (1.702 m)   Wt 177 lb 1.6 oz (80.3 kg)   SpO2 100%   BMI 27.74 kg/m  ECOG: 1  Physical Exam Constitutional:      General: He is not in acute distress.    Appearance: He is not diaphoretic.  HENT:     Head: Normocephalic and atraumatic.  Eyes:     Comments: Conjunctiva is pale  Skin:    Coloration: Skin is pale.     Findings: No erythema or rash.  Neurological:     Mental Status: He is alert.     Gait: Gait normal.  Psychiatric:     Comments: The patient is intermittently tearful.     Lab Review:     Component Value Date/Time   NA 136 03/14/2020 1035   K 4.3 03/14/2020 1035   CL 105 03/14/2020 1035   CO2 19 (L) 03/14/2020 1035   GLUCOSE 357 (H) 03/14/2020 1035   BUN 13 03/14/2020 1035   CREATININE 0.92 03/14/2020 1035   CREATININE 0.88 02/05/2016 1156   CALCIUM 9.3 03/14/2020 1035   PROT 6.5 03/14/2020 1035   ALBUMIN 3.9 03/14/2020 1035   AST 11 (L) 03/14/2020 1035   ALT 13 03/14/2020 1035   ALKPHOS 30 (L) 03/14/2020 1035   BILITOT 0.6 03/14/2020 1035   GFRNONAA >60 03/14/2020 1035   GFRAA >60 03/14/2020 1035       Component Value Date/Time   WBC 3.5 (L) 03/14/2020 1035   WBC 5.5 03/04/2020  1516   RBC 1.85 (L) 03/14/2020 1035   HGB 5.2 (LL) 03/14/2020 1035   HCT 16.8 (L) 03/14/2020 1035   PLT 362 03/14/2020 1035   MCV 90.8 03/14/2020 1035   MCH 28.1 03/14/2020 1035   MCHC 31.0 03/14/2020 1035  RDW 15.9 (H) 03/14/2020 1035   LYMPHSABS 0.6 (L) 03/14/2020 1035   MONOABS 0.2 03/14/2020 1035   EOSABS 0.1 03/14/2020 1035   BASOSABS 0.0 03/14/2020 1035   -------------------------------  Imaging from last 24 hours (if applicable):  Radiology interpretation: No results found.

## 2020-03-16 LAB — BPAM RBC
Blood Product Expiration Date: 202107192359
Blood Product Expiration Date: 202107202359
ISSUE DATE / TIME: 202107021257
ISSUE DATE / TIME: 202107021257
Unit Type and Rh: 6200
Unit Type and Rh: 6200

## 2020-03-16 LAB — TYPE AND SCREEN
ABO/RH(D): AB POS
Antibody Screen: NEGATIVE
Unit division: 0
Unit division: 0

## 2020-03-18 ENCOUNTER — Ambulatory Visit (INDEPENDENT_AMBULATORY_CARE_PROVIDER_SITE_OTHER): Payer: BC Managed Care – PPO | Admitting: General Practice

## 2020-03-18 ENCOUNTER — Inpatient Hospital Stay (HOSPITAL_BASED_OUTPATIENT_CLINIC_OR_DEPARTMENT_OTHER): Payer: BC Managed Care – PPO | Admitting: Medical

## 2020-03-18 ENCOUNTER — Other Ambulatory Visit: Payer: Self-pay

## 2020-03-18 ENCOUNTER — Telehealth: Payer: Self-pay | Admitting: General Practice

## 2020-03-18 ENCOUNTER — Inpatient Hospital Stay: Payer: BC Managed Care – PPO

## 2020-03-18 VITALS — BP 105/69 | HR 67 | Temp 97.6°F | Resp 18 | Ht 67.0 in | Wt 181.9 lb

## 2020-03-18 DIAGNOSIS — Q230 Congenital stenosis of aortic valve: Secondary | ICD-10-CM

## 2020-03-18 DIAGNOSIS — D500 Iron deficiency anemia secondary to blood loss (chronic): Secondary | ICD-10-CM

## 2020-03-18 DIAGNOSIS — Q231 Congenital insufficiency of aortic valve: Secondary | ICD-10-CM

## 2020-03-18 DIAGNOSIS — Z7901 Long term (current) use of anticoagulants: Secondary | ICD-10-CM

## 2020-03-18 DIAGNOSIS — K922 Gastrointestinal hemorrhage, unspecified: Secondary | ICD-10-CM

## 2020-03-18 LAB — CBC WITH DIFFERENTIAL (CANCER CENTER ONLY)
Abs Immature Granulocytes: 0.02 10*3/uL (ref 0.00–0.07)
Basophils Absolute: 0 10*3/uL (ref 0.0–0.1)
Basophils Relative: 1 %
Eosinophils Absolute: 0.1 10*3/uL (ref 0.0–0.5)
Eosinophils Relative: 3 %
HCT: 23.5 % — ABNORMAL LOW (ref 39.0–52.0)
Hemoglobin: 7.5 g/dL — ABNORMAL LOW (ref 13.0–17.0)
Immature Granulocytes: 1 %
Lymphocytes Relative: 22 %
Lymphs Abs: 0.8 10*3/uL (ref 0.7–4.0)
MCH: 28.4 pg (ref 26.0–34.0)
MCHC: 31.9 g/dL (ref 30.0–36.0)
MCV: 89 fL (ref 80.0–100.0)
Monocytes Absolute: 0.4 10*3/uL (ref 0.1–1.0)
Monocytes Relative: 10 %
Neutro Abs: 2.4 10*3/uL (ref 1.7–7.7)
Neutrophils Relative %: 63 %
Platelet Count: 342 10*3/uL (ref 150–400)
RBC: 2.64 MIL/uL — ABNORMAL LOW (ref 4.22–5.81)
RDW: 14.8 % (ref 11.5–15.5)
WBC Count: 3.7 10*3/uL — ABNORMAL LOW (ref 4.0–10.5)
nRBC: 0 % (ref 0.0–0.2)

## 2020-03-18 LAB — POCT INR: INR: 2.4 (ref 2.0–3.0)

## 2020-03-18 LAB — SAMPLE TO BLOOD BANK

## 2020-03-18 LAB — PREPARE RBC (CROSSMATCH)

## 2020-03-18 MED ORDER — SODIUM CHLORIDE 0.9% IV SOLUTION
250.0000 mL | Freq: Once | INTRAVENOUS | Status: AC
  Administered 2020-03-18: 250 mL via INTRAVENOUS
  Filled 2020-03-18: qty 250

## 2020-03-18 MED ORDER — ACETAMINOPHEN 325 MG PO TABS
ORAL_TABLET | ORAL | Status: AC
  Filled 2020-03-18: qty 2

## 2020-03-18 MED ORDER — ACETAMINOPHEN 325 MG PO TABS
650.0000 mg | ORAL_TABLET | Freq: Once | ORAL | Status: AC
  Administered 2020-03-18: 650 mg via ORAL

## 2020-03-18 NOTE — Patient Instructions (Addendum)
Pre visit review using our clinic review tool, if applicable. No additional management support is needed unless otherwise documented below in the visit note.  Continue to take 10 mg daily.  Re-check in 3 weeks.   Patient's Hgb was 5.2 on 7/2 and he received 2 units of blood.  He is going to have labs drawn again this morning.

## 2020-03-18 NOTE — Telephone Encounter (Signed)
-----   Message from Kristian Covey, MD sent at 03/18/2020 10:19 AM EDT ----- Regarding: RE: INR Agree.   Thanks for the feedback.    Bruce ----- Message ----- From: Garrison Columbus, RN Sent: 03/18/2020   9:16 AM EDT To: Kristian Covey, MD Subject: INR                                            Good morning Dr. Caryl Never,  FYI, I saw pt this morning at [facility].  His INR was 2.4 (goal range is 2.5 to 3.5)  He had a Hgb of 5.2 on Friday and received 2 units of blood.  He is going to have labs drawn again this morning.  I didn't think it was appropriate to boost his dosage due to the fact that he is obviously loosing blood. He was very pale and short of breath.   Just wanted to keep you in the loop and let you know.  Thanks, Bailey Mech, RN

## 2020-03-18 NOTE — Patient Instructions (Signed)

## 2020-03-18 NOTE — Progress Notes (Signed)
Received critical lab value at 1135: Hgb 7.5 PA Zenaida Niece aware.

## 2020-03-18 NOTE — Progress Notes (Signed)
Medical screening examination/treatment/procedure(s) were performed by non-physician practitioner and as supervising physician I was immediately available for consultation/collaboration. I agree with above. Anikin Prosser, MD   

## 2020-03-19 ENCOUNTER — Encounter: Payer: Self-pay | Admitting: *Deleted

## 2020-03-19 LAB — TYPE AND SCREEN
ABO/RH(D): AB POS
Antibody Screen: NEGATIVE
Unit division: 0

## 2020-03-19 LAB — BPAM RBC
Blood Product Expiration Date: 202107082359
ISSUE DATE / TIME: 202107061247
Unit Type and Rh: 600

## 2020-03-19 NOTE — Telephone Encounter (Signed)
Left message for pt to call back  °

## 2020-03-19 NOTE — Telephone Encounter (Signed)
Patient called wanting to discuss a double balloon enteroscopy is what he called it however he said he was not sure if that's correct. He said he would like to possibly move forward with scheduling it

## 2020-03-19 NOTE — Progress Notes (Signed)
Per MD pt needing to be scheduled for bone marrow biopsy tomorrow 03/20/20.  High priority message sent to scheduling, flow cytometry notified and pt notified and verbalized understanding of apt date and time.

## 2020-03-19 NOTE — Telephone Encounter (Signed)
Patient returned your phone call.

## 2020-03-20 ENCOUNTER — Inpatient Hospital Stay: Payer: BC Managed Care – PPO

## 2020-03-20 ENCOUNTER — Other Ambulatory Visit: Payer: Self-pay

## 2020-03-20 ENCOUNTER — Inpatient Hospital Stay (HOSPITAL_BASED_OUTPATIENT_CLINIC_OR_DEPARTMENT_OTHER): Payer: BC Managed Care – PPO | Admitting: Hematology and Oncology

## 2020-03-20 VITALS — BP 124/78 | HR 69 | Temp 97.8°F | Resp 18

## 2020-03-20 DIAGNOSIS — D500 Iron deficiency anemia secondary to blood loss (chronic): Secondary | ICD-10-CM | POA: Diagnosis not present

## 2020-03-20 LAB — CBC WITH DIFFERENTIAL/PLATELET
Abs Immature Granulocytes: 0.03 10*3/uL (ref 0.00–0.07)
Basophils Absolute: 0 10*3/uL (ref 0.0–0.1)
Basophils Relative: 1 %
Eosinophils Absolute: 0.1 10*3/uL (ref 0.0–0.5)
Eosinophils Relative: 2 %
HCT: 26.9 % — ABNORMAL LOW (ref 39.0–52.0)
Hemoglobin: 8.5 g/dL — ABNORMAL LOW (ref 13.0–17.0)
Immature Granulocytes: 1 %
Lymphocytes Relative: 22 %
Lymphs Abs: 0.9 10*3/uL (ref 0.7–4.0)
MCH: 27.2 pg (ref 26.0–34.0)
MCHC: 31.6 g/dL (ref 30.0–36.0)
MCV: 86.2 fL (ref 80.0–100.0)
Monocytes Absolute: 0.4 10*3/uL (ref 0.1–1.0)
Monocytes Relative: 9 %
Neutro Abs: 2.7 10*3/uL (ref 1.7–7.7)
Neutrophils Relative %: 65 %
Platelets: 334 10*3/uL (ref 150–400)
RBC: 3.12 MIL/uL — ABNORMAL LOW (ref 4.22–5.81)
RDW: 16.3 % — ABNORMAL HIGH (ref 11.5–15.5)
WBC: 4.2 10*3/uL (ref 4.0–10.5)
nRBC: 0 % (ref 0.0–0.2)

## 2020-03-20 MED ORDER — LIDOCAINE HCL 2 % IJ SOLN
INTRAMUSCULAR | Status: AC
  Filled 2020-03-20: qty 20

## 2020-03-20 NOTE — Progress Notes (Signed)
Symptoms Management Clinic Progress Note   Dennis Bates [id number] Aug 23, 1961 59 y.o.  Dennis Bates is managed by Dr. Serena Croissant  Actively treated with chemotherapy/immunotherapy/hormonal therapy: no  Next scheduled appointment with provider: 04/01/2020  Assessment: Plan:    Iron deficiency anemia due to chronic blood loss - Plan: Type and screen, Care order/instruction, 0.9 %  sodium chloride infusion (Manually program via Guardrails IV Fluids), Prepare RBC (crossmatch), Transfuse RBC, acetaminophen (TYLENOL) tablet 650 mg, Type and screen, Prepare RBC (crossmatch), Transfuse RBC   Gastrointestinal hemorrhage with anemia: The patient presents to the clinic today after being seen on 03/14/2020 when he reported significant fatigue with a CBC returning with a hemoglobin of 5.2 and a hematocrit of 16.8.  He was transfused with 2 units of packed red blood cells.  He returns today with a CBC showing a hemoglobin of 7.5 and a hematocrit of 23.5.  He was transfused with 1 unit of packed red blood cells. He will return on 03/24/2020 for repeat labs and for an additional transfusion if indicated.  Anticoagulation: The patient continues on Coumadin.    Please see After Visit Summary for patient specific instructions.  Future Appointments  Date Time Provider Department Center  03/25/2020 10:00 AM [facility] 3 [facility] None  03/25/2020 10:30 AM Stefan Karen, Zenaida Niece E., PA-C [facility] None  04/01/2020  8:15 AM [facility] LAB 1 [facility] None  04/01/2020  8:45 AM Serena Croissant, MD [facility] None  04/08/2020  8:30 AM [facility] None    Orders Placed This Encounter  Procedures  . Care order/instruction  . Type and screen  . Prepare RBC (crossmatch)       Subjective:   Patient ID:  Dennis Bates is a 59 y.o. (DOB 11/19/1960) male.  Chief Complaint:  Chief Complaint  Patient presents with  . Anemia    Anemia There has been no abdominal pain,  fever, light-headedness or palpitations.   Dennis Bates is a 59 y.o. male with a diagnosis of gastrointestinal bleeding.  He patient presents to the clinic today after being seen on 03/14/2020 when he reported significant fatigue with a CBC returning with a hemoglobin of 5.2 and a hematocrit of 16.8.  He was transfused with 2 units of packed red blood cells.  He returns today with a CBC showing a hemoglobin of 7.5 and a hematocrit of 23.5.  His previously reported fatigue, dizziness, shortness of breath, episodic chest pain are all better.  He continues on Coumadin which he takes secondary to an artificial heart valve.  He continues off of oral iron.  He continues to have dark stools.  His bowel movements are not black and tarry but are dark.  Medications: I have reviewed the patient's current medications.  Allergies: No Known Allergies  Past Medical History:  Diagnosis Date  . Aortic stenosis due to bicuspid aortic valve    Severe aortic stenosis of bicuspid valve - s/pAVR with Bentall ( 09/2015)  . Bell's palsy    LAST EPISODE 09-2015  . CAD S/P percutaneous coronary angioplasty 09/20/2014   a. inferolat STEMI ->> LHC-Angio: Proximal/ostial LAD 20%, OM2 99% -->> PCI: 19mm x 16 mm Promus Premier DES to the OM2.(~3.5 mm)  . Diabetes mellitus type 2, controlled, with complications (HCC)   . Hyperlipidemia   . Hypertension   . Hypertriglyceridemia   . ST elevation myocardial infarction (STEMI) of inferior wall (HCC) 09/20/2014   99 % occluded Cx-OM2 Promus DES 3.0 mm x  16 mm (3.5 mm)    Past Surgical History:  Procedure Laterality Date  . BENTALL PROCEDURE N/A 10/09/2015   Procedure: BENTALL PROCEDURE (using a St Jude mechanical valve, size 23);  Surgeon: Alleen Borne, MD;  Location[facility] OR;  Service: Open Heart Surgery;  Laterality: N/A;  CIRC ARRESTNEEDS RIGHT RADIAL A-LINE  . COLONOSCOPY WITH PROPOFOL N/A 01/21/2020   Procedure: COLONOSCOPY WITH PROPOFOL;  Surgeon: Lynann Bologna, MD;   Location: [facility] ENDOSCOPY;  Service: Endoscopy;  Laterality: N/A;  . ENTEROSCOPY N/A 01/24/2020   Procedure: ENTEROSCOPY;  Surgeon: Sherrilyn Rist, MD;  Location: [facility] ENDOSCOPY;  Service: Gastroenterology;  Laterality: N/A;  . ESOPHAGOGASTRODUODENOSCOPY (EGD) WITH PROPOFOL N/A 01/21/2020   Procedure: ESOPHAGOGASTRODUODENOSCOPY (EGD) WITH PROPOFOL;  Surgeon: Lynann Bologna, MD;  Location: [facility] ENDOSCOPY;  Service: Endoscopy;  Laterality: N/A;  . GIVENS CAPSULE STUDY N/A 01/22/2020   Procedure: GIVENS CAPSULE STUDY;  Surgeon: Sherrilyn Rist, MD;  Location: [facility] ENDOSCOPY;  Service: Gastroenterology;  Laterality: N/A;  . KNEE ARTHROSCOPY W/ ACL RECONSTRUCTION Right    90's; "2 ATHROSCOPIC , 2 REBUILDS"  . LEFT HEART CATHETERIZATION WITH CORONARY ANGIOGRAM N/A 09/20/2014   Procedure: LEFT HEART CATHETERIZATION WITH CORONARY ANGIOGRAM;  Surgeon: Marykay Lex, MD;  Location: [facility] CATH LAB;  Proximal/ostial LAD 20%, OM2 99% -->> aspiration thrombectomy and DES PCI:   . PERCUTANEOUS CORONARY STENT INTERVENTION (PCI-S)  09/20/2014   Aspiration thrombectomy and DES PCI- OM 2(m-dCx): Promus Premier DES 3.0 mm x 16 mm (3.5 mm)  . RIGHT/LEFT HEART CATH AND CORONARY ANGIOGRAPHY  09/2015   Dr. Nicki Guadalajara: Widely patent OM 2 stent.  Mild disease in RCA and LAD.Relatively normal right heart cath pressures.  Normal cardiac output.  . TEE WITHOUT CARDIOVERSION N/A 10/09/2015   Procedure: TRANSESOPHAGEAL ECHOCARDIOGRAM (TEE);  Surgeon: Alleen Borne, MD;  Location: [facility] OR;  Service: Open Heart Surgery;  Laterality: N/A;  . TOTAL KNEE ARTHROPLASTY Right 12/09/2017   Procedure: RIGHT TOTAL KNEE ARTHROPLASTY REMOVAL OF HARDWARE RIGHT KNEE;  Surgeon: Jodi Geralds, MD;  Location: [facility] ORS;  Service: Orthopedics;  Laterality: Right;  . TRANSTHORACIC ECHOCARDIOGRAM  02/2016   Post AVR: mechanical: AVR without obstuction. LVEF improved to 65-70%.  Dennis Bates ECHOCARDIOGRAM  January 2016, February2016   Probable bicuspid  aortic valve: a. mod-sev by 2D ECHO 09/2014. AVA 0.9cm^2; b) 10/2014 Echo:. Severe AS Mn-Pk Gradient 41-71 mmHg, AVA ~0.79 cm2, EF 60-65%    Family History  Problem Relation Age of Onset  . Heart disease Mother   . CAD Neg Hx        per wife    Social History   Socioeconomic History  . Marital status: Married    Spouse name: Not on file  . Number of children: Not on file  . Years of education: Not on file  . Highest education level: Not on file  Occupational History  . Not on file  Tobacco Use  . Smoking status: Never Smoker  . Smokeless tobacco: Former Neurosurgeon    Types: Engineer, drilling  . Vaping Use: Never used  Substance and Sexual Activity  . Alcohol use: Yes    Alcohol/week: 0.0 standard drinks    Comment: socially  . Drug use: No  . Sexual activity: Not on file  Other Topics Concern  . Not on file  Social History Narrative  . Not on file   Social Determinants of Health   Financial Resource Strain:   . Difficulty of Paying Living  Expenses:   Food Insecurity:   . Worried About Programme researcher, broadcasting/film/videounning Out of Food in the Last Year:   . [employer]ista[employer] Out of Food in the Last Year:   Transportation Needs:   . Freight forwarderLack of Transportation (Medical):   Marland Kitchen. Lack of Transportation (Non-Medical):   Physical Activity:   . Days of Exercise per Week:   . Minutes of Exercise per Session:   Stress:   . Feeling of Stress :   Social Connections:   . Frequency of Communication with Friends and Family:   . Frequency of Social Gatherings with Friends and Family:   . Attends Religious Services:   . Active Member of Clubs or Organizations:   . Attends BankerClub or Organization Meetings:   Marland Kitchen. Marital Status:   Intimate Partner Violence:   . Fear of Current or Ex-Partner:   . Emotionally Abused:   Marland Kitchen. Physically Abused:   . Sexually Abused:     Past Medical History, Surgical history, Social history, and Family history were reviewed and updated as appropriate.   Please see review of systems for further details on  the patient's review from today.   Review of Systems:  Review of Systems  Constitutional: Positive for activity change and fatigue. Negative for chills, diaphoresis and fever.  HENT: Negative for trouble swallowing and voice change.   Respiratory: Positive for shortness of breath. Negative for cough, chest tightness and wheezing.   Cardiovascular: Positive for chest pain. Negative for palpitations.  Gastrointestinal: Negative for abdominal pain, anal bleeding, blood in stool, constipation, diarrhea, nausea and vomiting.       Dark stools.  Musculoskeletal: Negative for back pain and myalgias.  Neurological: Positive for dizziness and weakness. Negative for light-headedness and headaches.    Objective:   Physical Exam:  BP 105/69   Pulse 67   Temp 97.6 F (36.4 C) (Oral)   Resp 18   Ht 5\' 7"  (1.702 m)   Wt 181 lb 14.4 oz (82.5 kg)   SpO2 100%   BMI 28.49 kg/m  ECOG: 1  Physical Exam Constitutional:      General: He is not in acute distress.    Appearance: He is not diaphoretic.  HENT:     Head: Normocephalic and atraumatic.  Eyes:     General:        Right eye: No discharge.        Left eye: No discharge.     Conjunctiva/sclera: Conjunctivae normal.     Comments: Conjunctiva is pale  Cardiovascular:     Rate and Rhythm: Normal rate and regular rhythm.     Heart sounds: No murmur heard.  No friction rub. No gallop.   Pulmonary:     Effort: Pulmonary effort is normal. No respiratory distress.     Breath sounds: Normal breath sounds. No wheezing, rhonchi or rales.  Skin:    Coloration: Skin is pale.     Findings: No erythema or rash.  Neurological:     Mental Status: He is alert.     Gait: Gait normal.  Psychiatric:        Mood and Affect: Mood normal.        Behavior: Behavior normal.        Thought Content: Thought content normal.        Judgment: Judgment normal.     Comments:       Lab Review:     Component Value Date/Time   NA 136 03/14/2020 1035    K 4.3  03/14/2020 1035   CL 105 03/14/2020 1035   CO2 19 (L) 03/14/2020 1035   GLUCOSE 357 (H) 03/14/2020 1035   BUN 13 03/14/2020 1035   CREATININE 0.92 03/14/2020 1035   CREATININE 0.88 02/05/2016 1156   CALCIUM 9.3 03/14/2020 1035   PROT 6.5 03/14/2020 1035   ALBUMIN 3.9 03/14/2020 1035   AST 11 (L) 03/14/2020 1035   ALT 13 03/14/2020 1035   ALKPHOS 30 (L) 03/14/2020 1035   BILITOT 0.6 03/14/2020 1035   GFRNONAA >60 03/14/2020 1035   GFRAA >60 03/14/2020 1035       Component Value Date/Time   WBC 4.2 03/20/2020 0903   RBC 3.12 (L) 03/20/2020 0903   HGB 8.5 (L) 03/20/2020 0903   HGB 7.5 (L) 03/18/2020 1117   HCT 26.9 (L) 03/20/2020 0903   PLT 334 03/20/2020 0903   PLT 342 03/18/2020 1117   MCV 86.2 03/20/2020 0903   MCH 27.2 03/20/2020 0903   MCHC 31.6 03/20/2020 0903   RDW 16.3 (H) 03/20/2020 0903   LYMPHSABS 0.9 03/20/2020 0903   MONOABS 0.4 03/20/2020 0903   EOSABS 0.1 03/20/2020 0903   BASOSABS 0.0 03/20/2020 0903   -------------------------------  Imaging from last 24 hours (if applicable):  Radiology interpretation: No results found.

## 2020-03-20 NOTE — Patient Instructions (Signed)
Bone Marrow Aspiration and Bone Marrow Biopsy, Adult, Care After °This sheet gives you information about how to care for yourself after your procedure. Your health care provider may also give you more specific instructions. If you have problems or questions, contact your health care provider. °What can I expect after the procedure? °After the procedure, it is common to have: °· Mild pain and tenderness. °· Swelling. °· Bruising. °Follow these instructions at home: °Puncture site care ° °· Follow instructions from your health care provider about how to take care of the puncture site. Make sure you: °? Wash your hands with soap and water before and after you change your bandage (dressing). If soap and water are not available, use hand sanitizer. °? Change your dressing as told by your health care provider. °· Check your puncture site every day for signs of infection. Check for: °? More redness, swelling, or pain. °? Fluid or blood. °? Warmth. °? Pus or a bad smell. °Activity °· Return to your normal activities as told by your health care provider. Ask your health care provider what activities are safe for you. °· Do not lift anything that is heavier than 10 lb (4.5 kg), or the limit that you are told, until your health care provider says that it is safe. °· Do not drive for 24 hours if you were given a sedative during your procedure. °General instructions ° °· Take over-the-counter and prescription medicines only as told by your health care provider. °· Do not take baths, swim, or use a hot tub until your health care provider approves. Ask your health care provider if you may take showers. You may only be allowed to take sponge baths. °· If directed, put ice on the affected area. To do this: °? Put ice in a plastic bag. °? Place a towel between your skin and the bag. °? Leave the ice on for 20 minutes, 2-3 times a day. °· Keep all follow-up visits as told by your health care provider. This is important. °Contact a  health care provider if: °· Your pain is not controlled with medicine. °· You have a fever. °· You have more redness, swelling, or pain around the puncture site. °· You have fluid or blood coming from the puncture site. °· Your puncture site feels warm to the touch. °· You have pus or a bad smell coming from the puncture site. °Summary °· After the procedure, it is common to have mild pain, tenderness, swelling, and bruising. °· Follow instructions from your health care provider about how to take care of the puncture site and what activities are safe for you. °· Take over-the-counter and prescription medicines only as told by your health care provider. °· Contact a health care provider if you have any signs of infection, such as fluid or blood coming from the puncture site. °This information is not intended to replace advice given to you by your health care provider. Make sure you discuss any questions you have with your health care provider. °Document Revised: 01/16/2019 Document Reviewed: 01/16/2019 °Elsevier Patient Education © 2020 [employer] ° °

## 2020-03-20 NOTE — Telephone Encounter (Signed)
Dr. Pamelia Hoit, I wanted to get your input about Mr. Rowsick after reviewing your notes in [facility]. I had previously suggested referral to [facility] for double balloon enteroscopy because we did not have a source. Is it appropriate to cancel that referral based on your recent evaluation? Thank you.

## 2020-03-20 NOTE — Progress Notes (Signed)
Patient arrived today for bone marrow aspiration. Patient tolerated procedure well. Vitals signs remained stable post procedure and no bleeding noted after 30 minute wait. Patient educated on post procedure care and signs/symptoms for when to call doctor. Patient verbalized understanding and walked to lab for blood work.  

## 2020-03-20 NOTE — Telephone Encounter (Signed)
Pt called and reports he has received several blood transfusions over the past week or so. He wonders if he needs to pursue the enteroscopy that Dr. Tarri Glenn mentioned. Dr. Sonny Dandy does not think he is losing blood, pt had a Bone marrow biopsy done today. Please advise.

## 2020-03-21 ENCOUNTER — Telehealth: Payer: Self-pay | Admitting: Hematology and Oncology

## 2020-03-21 LAB — SURGICAL PATHOLOGY

## 2020-03-21 NOTE — Telephone Encounter (Signed)
No 7/8 los, no changes made to pt schedule  

## 2020-03-21 NOTE — Progress Notes (Signed)
INDICATION: Severe anemia evaluation  Bone Marrow Biopsy and Aspiration Procedure Note   Informed consent was obtained and potential risks including bleeding, infection and pain were reviewed with the patient.  The patient's name, date of birth, identification, consent and allergies were verified prior to the start of procedure and time out was performed.  The left posterior iliac crest was chosen as the site of biopsy.  The skin was prepped with ChloraPrep.   8 cc of 1% lidocaine was used to provide local anaesthesia.   10 cc of bone marrow aspirate was obtained followed by 1cm biopsy.  Pressure was applied to the biopsy site and bandage was placed over the biopsy site. Patient was made to lie on the back for 15 mins prior to discharge.  The procedure was tolerated well. COMPLICATIONS: None BLOOD LOSS: none The patient was discharged home in stable condition with a 1 week follow up to review results.  Patient was provided with post bone marrow biopsy instructions and instructed to call if there was any bleeding or worsening pain.  Specimens sent for flow cytometry, cytogenetics and additional studies.  Signed Harriette Ohara, MD

## 2020-03-24 ENCOUNTER — Other Ambulatory Visit: Payer: Self-pay | Admitting: *Deleted

## 2020-03-24 DIAGNOSIS — D500 Iron deficiency anemia secondary to blood loss (chronic): Secondary | ICD-10-CM

## 2020-03-24 NOTE — Progress Notes (Signed)
Patient Care Team: Eulas Post, MD as PCP - General (Family Medicine) Leonie Man, MD as PCP - Cardiology (Cardiology)  DIAGNOSIS:    ICD-10-CM   1. Symptomatic anemia  D64.9 CBC with Differential (Cancer Center Only)    Reticulocytes ([place])    CHIEF COMPLIANT: Follow-up of severe anemia  INTERVAL HISTORY: Dennis Bates is a 59 y.o. with above-mentioned history of iron deficiency anemia due to chronic GI blood loss from anticoagulation on Coumadin and anemia of chronic disease. He presented for labs on 03/14/20 with significant fatigue and labs showed Hg 5.2, HCT 16.8. He was given 2 units of PRBCs on 03/14/20 and 1 additional unit on 03/18/20. He underwent a bone marrow biopsy on 03/20/20. He presents to the clinic today for follow-up.   ALLERGIES:  has No Known Allergies.  MEDICATIONS:  Current Outpatient Medications  Medication Sig Dispense Refill  . acetaminophen (TYLENOL) 325 MG tablet Take 2 tablets (650 mg total) by mouth every 6 (six) hours as needed for mild pain.    . ASPIRIN 81 PO Take 1 tablet by mouth daily.     . carvedilol (COREG) 3.125 MG tablet Take 1 tablet (3.125 mg total) by mouth 2 (two) times daily. 60 tablet 3  . ferrous sulfate 325 (65 FE) MG tablet Take 325 mg by mouth 2 (two) times daily with a meal.    . gemfibrozil (LOPID) 600 MG tablet Take 600 mg by mouth in the morning and at bedtime.    . metFORMIN (GLUCOPHAGE) 1000 MG tablet Take 1,000 mg by mouth 2 (two) times daily with a meal.     . rosuvastatin (CRESTOR) 20 MG tablet Take 20 mg by mouth at bedtime.    Marland Kitchen warfarin (COUMADIN) 10 MG tablet Take 10 mg by mouth daily.     No current facility-administered medications for this visit.    PHYSICAL EXAMINATION: ECOG PERFORMANCE STATUS: 1 - Symptomatic but completely ambulatory  Vitals:   03/25/20 1043  BP: 107/74  Pulse: (!) 59  Resp: 17  Temp: 98.3 F (36.8 C)  SpO2: 100%   Filed Weights   03/25/20 1043  Weight: 180 lb 3.2 oz (81.7  kg)    LABORATORY DATA:  I have reviewed the data as listed CMP Latest Ref Rng & Units 03/14/2020 01/25/2020 01/24/2020  Glucose 70 - 99 mg/dL 357(H) 158(H) 117(H)  BUN 6 - 20 mg/dL 13 8 5(L)  Creatinine 0.61 - 1.24 mg/dL 0.92 0.86 1.01  Sodium 135 - 145 mmol/L 136 138 142  Potassium 3.5 - 5.1 mmol/L 4.3 4.5 4.4  Chloride 98 - 111 mmol/L 105 104 108  CO2 22 - 32 mmol/L 19(L) 26 25  Calcium 8.9 - 10.3 mg/dL 9.3 9.4 9.2  Total Protein 6.5 - 8.1 g/dL 6.5 - -  Total Bilirubin 0.3 - 1.2 mg/dL 0.6 - -  Alkaline Phos 38 - 126 U/L 30(L) - -  AST 15 - 41 U/L 11(L) - -  ALT 0 - 44 U/L 13 - -    Lab Results  Component Value Date   WBC 3.5 (L) 03/25/2020   HGB 9.1 (L) 03/25/2020   HCT 29.0 (L) 03/25/2020   MCV 83.6 03/25/2020   PLT 280 03/25/2020   NEUTROABS 2.1 03/25/2020    ASSESSMENT & PLAN:  Symptomatic anemia Iron deficiency anemia most likely due to GI bleed from being on blood thinners and having AVMs: Hospitalization 01/18/2020-01/25/2020 status post EGD and colonoscopy and pill endoscopy: Patient received 2  units of PRBC 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  I suspect the lack of red blood cells is a result of iron deficiency/blood loss.  Plan: Supportive care with as needed iron infusions and blood transfusions I instructed the patient to resume B12 and iron replacement therapies  that he is taking at home. Return to clinic in 2 weeks with labs and follow-up   Orders Placed This Encounter  Procedures  . CBC with Differential (Cancer Center Only)    Standing Status:   Future    Standing Expiration Date:   03/25/2021  . Reticulocytes ([place])    Standing Status:   Future    Standing Expiration Date:   03/25/2021   The patient has a good understanding of the overall plan. he agrees with it. he will call with any problems that may develop before the next visit here.  Total time spent: 20 mins including face to face time and time spent for planning, charting and coordination of care  Nicholas Lose, MD 03/25/2020  I, Cloyde Reams Dorshimer, am acting as scribe for Dr. Nicholas Lose.  I have reviewed the above documentation for accuracy and completeness, and I agree with the above.

## 2020-03-25 ENCOUNTER — Inpatient Hospital Stay: Payer: BC Managed Care – PPO

## 2020-03-25 ENCOUNTER — Telehealth: Payer: Self-pay | Admitting: Hematology and Oncology

## 2020-03-25 ENCOUNTER — Inpatient Hospital Stay (HOSPITAL_BASED_OUTPATIENT_CLINIC_OR_DEPARTMENT_OTHER): Payer: BC Managed Care – PPO | Admitting: Hematology and Oncology

## 2020-03-25 ENCOUNTER — Inpatient Hospital Stay: Payer: BC Managed Care – PPO | Admitting: Medical

## 2020-03-25 ENCOUNTER — Other Ambulatory Visit: Payer: Self-pay

## 2020-03-25 DIAGNOSIS — D500 Iron deficiency anemia secondary to blood loss (chronic): Secondary | ICD-10-CM

## 2020-03-25 DIAGNOSIS — D649 Anemia, unspecified: Secondary | ICD-10-CM

## 2020-03-25 LAB — CBC WITH DIFFERENTIAL (CANCER CENTER ONLY)
Abs Immature Granulocytes: 0.03 10*3/uL (ref 0.00–0.07)
Basophils Absolute: 0 10*3/uL (ref 0.0–0.1)
Basophils Relative: 1 %
Eosinophils Absolute: 0.1 10*3/uL (ref 0.0–0.5)
Eosinophils Relative: 4 %
HCT: 29 % — ABNORMAL LOW (ref 39.0–52.0)
Hemoglobin: 9.1 g/dL — ABNORMAL LOW (ref 13.0–17.0)
Immature Granulocytes: 1 %
Lymphocytes Relative: 23 %
Lymphs Abs: 0.8 10*3/uL (ref 0.7–4.0)
MCH: 26.2 pg (ref 26.0–34.0)
MCHC: 31.4 g/dL (ref 30.0–36.0)
MCV: 83.6 fL (ref 80.0–100.0)
Monocytes Absolute: 0.4 10*3/uL (ref 0.1–1.0)
Monocytes Relative: 11 %
Neutro Abs: 2.1 10*3/uL (ref 1.7–7.7)
Neutrophils Relative %: 60 %
Platelet Count: 280 10*3/uL (ref 150–400)
RBC: 3.47 MIL/uL — ABNORMAL LOW (ref 4.22–5.81)
RDW: 15 % (ref 11.5–15.5)
WBC Count: 3.5 10*3/uL — ABNORMAL LOW (ref 4.0–10.5)
nRBC: 0 % (ref 0.0–0.2)

## 2020-03-25 LAB — SAMPLE TO BLOOD BANK

## 2020-03-25 NOTE — Assessment & Plan Note (Signed)
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  I suspect the lack of red blood cells is a result of iron deficiency/blood loss.  Plan: Supportive care with as needed iron infusions and blood transfusions

## 2020-03-25 NOTE — Telephone Encounter (Signed)
Scheduled appts per 7/13 los. Gave pt a print out of AVS.  °

## 2020-03-28 ENCOUNTER — Encounter (HOSPITAL_COMMUNITY): Payer: Self-pay | Admitting: Hematology and Oncology

## 2020-04-01 ENCOUNTER — Other Ambulatory Visit: Payer: BC Managed Care – PPO

## 2020-04-01 ENCOUNTER — Ambulatory Visit: Payer: BC Managed Care – PPO | Admitting: Hematology and Oncology

## 2020-04-06 NOTE — Progress Notes (Signed)
Patient Care Team: Kristian Covey, MD as PCP - General (Family Medicine) Marykay Lex, MD as PCP - Cardiology (Cardiology)  DIAGNOSIS:    ICD-10-CM   1. Iron deficiency anemia due to chronic blood loss  D50.0 CBC with Differential (Cancer Center Only)    Ferritin    Iron and TIBC    Vitamin B12    Reticulocytes ([facility])    CHIEF COMPLIANT: Follow-up of severe anemia  INTERVAL HISTORY: Dennis Bates is a 59 y.o. with above-mentioned history of iron deficiency anemia due to chronic GI blood loss from anticoagulation on Coumadin and anemia of chronic disease. He presents to the clinic today for follow-up.  Without any treatment his hemoglobin continues to improve slowly.  He is also been feeling remarkably better.  ALLERGIES:  has No Known Allergies.  MEDICATIONS:  Current Outpatient Medications  Medication Sig Dispense Refill  . acetaminophen (TYLENOL) 325 MG tablet Take 2 tablets (650 mg total) by mouth every 6 (six) hours as needed for mild pain.    . ASPIRIN 81 PO Take 1 tablet by mouth daily.     . carvedilol (COREG) 3.125 MG tablet Take 1 tablet (3.125 mg total) by mouth 2 (two) times daily. 60 tablet 3  . ferrous sulfate 325 (65 FE) MG tablet Take 325 mg by mouth 2 (two) times daily with a meal.    . gemfibrozil (LOPID) 600 MG tablet Take 600 mg by mouth in the morning and at bedtime.    . metFORMIN (GLUCOPHAGE) 1000 MG tablet Take 1,000 mg by mouth 2 (two) times daily with a meal.     . rosuvastatin (CRESTOR) 20 MG tablet Take 20 mg by mouth at bedtime.    Marland Kitchen warfarin (COUMADIN) 10 MG tablet Take 10 mg by mouth daily.     No current facility-administered medications for this visit.    PHYSICAL EXAMINATION: ECOG PERFORMANCE STATUS: 1 - Symptomatic but completely ambulatory  Vitals:   04/07/20 0830  BP: 113/73  Pulse: 59  Resp: 18  Temp: 98.3 F (36.8 C)  SpO2: 100%   Filed Weights   04/07/20 0830  Weight: 183 lb 8 oz (83.2 kg)    LABORATORY DATA:    I have reviewed the data as listed CMP Latest Ref Rng & Units 03/14/2020 01/25/2020 01/24/2020  Glucose 70 - 99 mg/dL 829(H) 371(I) 967(E)  BUN 6 - 20 mg/dL 13 8 5(L)  Creatinine 9.38 - 1.24 mg/dL 1.01 7.51 0.25  Sodium 135 - 145 mmol/L 136 138 142  Potassium 3.5 - 5.1 mmol/L 4.3 4.5 4.4  Chloride 98 - 111 mmol/L 105 104 108  CO2 22 - 32 mmol/L 19(L) 26 25  Calcium 8.9 - 10.3 mg/dL 9.3 9.4 9.2  Total Protein 6.5 - 8.1 g/dL 6.5 - -  Total Bilirubin 0.3 - 1.2 mg/dL 0.6 - -  Alkaline Phos 38 - 126 U/L 30(L) - -  AST 15 - 41 U/L 11(L) - -  ALT 0 - 44 U/L 13 - -    Lab Results  Component Value Date   WBC 4.4 04/07/2020   HGB 10.2 (L) 04/07/2020   HCT 32.6 (L) 04/07/2020   MCV 83.6 04/07/2020   PLT 343 04/07/2020   NEUTROABS 2.8 04/07/2020    ASSESSMENT & PLAN:  Iron deficiency anemia Iron deficiency anemia most likely due to GI bleed from being on blood thinners and having AVMs: Hospitalization 01/18/2020-01/25/2020 status post EGD and colonoscopy and pill endoscopy: Patient received 2 units  of PRBC 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  Bone marrow aspiration biopsy: 03/20/2020 Cellular 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, 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, storage iron decreased, cytogenetics are Normal ------------------------------------------------------------------------------------------------------------------------------------------- Lab review: 01/25/2020: Hemoglobin 9.6, MCV 88.9, RDW 16.4, ferritin 33 Ferritin was 6 on 01/19/2020 03/04/2020: Hemoglobin 8.3, MCV 89.4, RDW 19.2, ferritin 88 03/06/2020: TIBC 444, iron saturation 46%, ferritin 62, DAT negative, hemoglobin 8.1, SPEP: No M protein, LDH 191, haptoglobin 32, erythropoietin 169 04/07/2020: Hemoglobin 10.2  Continue with oral iron and B12 replacement therapies. Return to clinic in 1 month for follow-up with labs. If his hemoglobin becomes normal at that time then we can see him in 6 months and  after that as-needed basis.     Orders Placed This Encounter  Procedures  . CBC with Differential (Cancer Center Only)    Standing Status:   Future    Standing Expiration Date:   04/07/2021  . Ferritin    Standing Status:   Future    Standing Expiration Date:   04/07/2021  . Iron and TIBC    Standing Status:   Future    Standing Expiration Date:   04/07/2021  . Vitamin B12    Standing Status:   Future    Standing Expiration Date:   04/07/2021  . Reticulocytes ([facility])    Standing Status:   Future    Standing Expiration Date:   04/07/2021   The patient has a good understanding of the overall plan. he agrees with it. he will call with any problems that may develop before the next visit here.  Total time spent: 20 mins including face to face time and time spent for planning, charting and coordination of care  Serena Croissant, MD 04/07/2020  I, Kirt Boys Dorshimer, am acting as scribe for Dr. Serena Croissant.  I have reviewed the above documentation for accuracy and completeness, and I agree with the above.

## 2020-04-07 ENCOUNTER — Telehealth: Payer: Self-pay | Admitting: Hematology and Oncology

## 2020-04-07 ENCOUNTER — Inpatient Hospital Stay (HOSPITAL_BASED_OUTPATIENT_CLINIC_OR_DEPARTMENT_OTHER): Payer: BC Managed Care – PPO | Admitting: Hematology and Oncology

## 2020-04-07 ENCOUNTER — Other Ambulatory Visit: Payer: Self-pay

## 2020-04-07 ENCOUNTER — Inpatient Hospital Stay: Payer: BC Managed Care – PPO

## 2020-04-07 DIAGNOSIS — D500 Iron deficiency anemia secondary to blood loss (chronic): Secondary | ICD-10-CM

## 2020-04-07 DIAGNOSIS — D649 Anemia, unspecified: Secondary | ICD-10-CM

## 2020-04-07 LAB — CBC WITH DIFFERENTIAL (CANCER CENTER ONLY)
Abs Immature Granulocytes: 0.05 10*3/uL (ref 0.00–0.07)
Basophils Absolute: 0.1 10*3/uL (ref 0.0–0.1)
Basophils Relative: 1 %
Eosinophils Absolute: 0.1 10*3/uL (ref 0.0–0.5)
Eosinophils Relative: 3 %
HCT: 32.6 % — ABNORMAL LOW (ref 39.0–52.0)
Hemoglobin: 10.2 g/dL — ABNORMAL LOW (ref 13.0–17.0)
Immature Granulocytes: 1 %
Lymphocytes Relative: 21 %
Lymphs Abs: 0.9 10*3/uL (ref 0.7–4.0)
MCH: 26.2 pg (ref 26.0–34.0)
MCHC: 31.3 g/dL (ref 30.0–36.0)
MCV: 83.6 fL (ref 80.0–100.0)
Monocytes Absolute: 0.5 10*3/uL (ref 0.1–1.0)
Monocytes Relative: 10 %
Neutro Abs: 2.8 10*3/uL (ref 1.7–7.7)
Neutrophils Relative %: 64 %
Platelet Count: 343 10*3/uL (ref 150–400)
RBC: 3.9 MIL/uL — ABNORMAL LOW (ref 4.22–5.81)
RDW: 16.6 % — ABNORMAL HIGH (ref 11.5–15.5)
WBC Count: 4.4 10*3/uL (ref 4.0–10.5)
nRBC: 0 % (ref 0.0–0.2)

## 2020-04-07 NOTE — Telephone Encounter (Signed)
Scheduled appts per 7/26 los. Gave pt a print out of AVS.  

## 2020-04-07 NOTE — Assessment & Plan Note (Signed)
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 Return to clinic in 1 month for follow-up with labs.

## 2020-04-08 ENCOUNTER — Ambulatory Visit (INDEPENDENT_AMBULATORY_CARE_PROVIDER_SITE_OTHER): Payer: BC Managed Care – PPO | Admitting: General Practice

## 2020-04-08 DIAGNOSIS — Z7901 Long term (current) use of anticoagulants: Secondary | ICD-10-CM

## 2020-04-08 DIAGNOSIS — Q231 Congenital insufficiency of aortic valve: Secondary | ICD-10-CM

## 2020-04-08 DIAGNOSIS — Q230 Congenital stenosis of aortic valve: Secondary | ICD-10-CM | POA: Diagnosis not present

## 2020-04-08 LAB — POCT INR: INR: 2 (ref 2.0–3.0)

## 2020-04-08 NOTE — Progress Notes (Signed)
Medical screening examination/treatment/procedure(s) were performed by non-physician practitioner and as supervising physician I was immediately available for consultation/collaboration. I agree with above. Rossie Scarfone, MD   

## 2020-04-08 NOTE — Patient Instructions (Addendum)
Pre visit review using our clinic review tool, if applicable. No additional management support is needed unless otherwise documented below in the visit note.  Take 1 1/2 tablets today and tomorrow (7/27 and 7/28).  On Thursday start taking 1 tablet daily except 1 1/2 tablets on Monday and Fridays.  Re-check in 2 weeks.

## 2020-04-22 ENCOUNTER — Ambulatory Visit (INDEPENDENT_AMBULATORY_CARE_PROVIDER_SITE_OTHER): Payer: BC Managed Care – PPO | Admitting: General Practice

## 2020-04-22 ENCOUNTER — Telehealth: Payer: Self-pay | Admitting: Cardiology

## 2020-04-22 ENCOUNTER — Other Ambulatory Visit: Payer: Self-pay

## 2020-04-22 DIAGNOSIS — Z7901 Long term (current) use of anticoagulants: Secondary | ICD-10-CM

## 2020-04-22 DIAGNOSIS — Q230 Congenital stenosis of aortic valve: Secondary | ICD-10-CM

## 2020-04-22 DIAGNOSIS — Q231 Congenital insufficiency of aortic valve: Secondary | ICD-10-CM | POA: Diagnosis not present

## 2020-04-22 LAB — POCT INR: INR: 4 — AB (ref 2.0–3.0)

## 2020-04-22 NOTE — Patient Instructions (Addendum)
Pre visit review using our clinic review tool, if applicable. No additional management support is needed unless otherwise documented below in the visit note.  Take 1 1/2 tablets today and tomorrow (7/27 and 7/28).  On Thursday start taking 1 tablet daily except 1 1/2 tablets on Monday and Fridays.  Patient will be checking with MD INR.

## 2020-04-22 NOTE — Progress Notes (Signed)
Medical screening examination/treatment/procedure(s) were performed by non-physician practitioner and as supervising physician I was immediately available for consultation/collaboration. I agree with above. Rayshon Albaugh, MD   

## 2020-04-22 NOTE — Telephone Encounter (Signed)
LVM for patient to return call to get follow scheduled with [facility] from recall list °

## 2020-05-01 ENCOUNTER — Ambulatory Visit (INDEPENDENT_AMBULATORY_CARE_PROVIDER_SITE_OTHER): Payer: BC Managed Care – PPO | Admitting: General Practice

## 2020-05-01 DIAGNOSIS — Q230 Congenital stenosis of aortic valve: Secondary | ICD-10-CM | POA: Diagnosis not present

## 2020-05-01 DIAGNOSIS — Q231 Congenital insufficiency of aortic valve: Secondary | ICD-10-CM

## 2020-05-01 DIAGNOSIS — Z7901 Long term (current) use of anticoagulants: Secondary | ICD-10-CM

## 2020-05-01 LAB — POCT INR: INR: 3.6 — AB (ref 2.0–3.0)

## 2020-05-01 NOTE — Patient Instructions (Signed)
Pre visit review using our clinic review tool, if applicable. No additional management support is needed unless otherwise documented below in the visit note.  Take  1/2 tablets today and continue to take 1 tablet daily except 1 1/2 tablets on Mondays.  Re-check in 2 weeks.  Patient will be checking with MD INR. LMOVM

## 2020-05-01 NOTE — Progress Notes (Signed)
Medical screening examination/treatment/procedure(s) were performed by non-physician practitioner and as supervising physician I was immediately available for consultation/collaboration. I agree with above. Chardonay Scritchfield, MD   

## 2020-05-06 ENCOUNTER — Telehealth: Payer: Self-pay

## 2020-05-06 NOTE — Telephone Encounter (Signed)
Per faxed letter from [facility]. patient has declined to schedule the double balloon enteroscopy.

## 2020-05-12 NOTE — Progress Notes (Signed)
Patient Care Team: Kristian Covey, MD as PCP - General (Family Medicine) Marykay Lex, MD as PCP - Cardiology (Cardiology)  DIAGNOSIS:    ICD-10-CM   1. Iron deficiency anemia due to chronic blood loss  D50.0     CHIEF COMPLIANT: Follow-up of severe anemia  INTERVAL HISTORY: Dennis Bates is a 59 y.o. with above-mentioned history of iron deficiency anemia due to chronic GI blood loss from anticoagulation on Coumadin and anemia of chronic disease. He is currently on treatment with oral iron and B12 replacement. Hepresents to the clinic todayfor follow-up.  ALLERGIES:  has No Known Allergies.  MEDICATIONS:  Current Outpatient Medications  Medication Sig Dispense Refill  . acetaminophen (TYLENOL) 325 MG tablet Take 2 tablets (650 mg total) by mouth every 6 (six) hours as needed for mild pain.    . ASPIRIN 81 PO Take 1 tablet by mouth daily.     . carvedilol (COREG) 3.125 MG tablet Take 1 tablet (3.125 mg total) by mouth 2 (two) times daily. 60 tablet 3  . ferrous sulfate 325 (65 FE) MG tablet Take 325 mg by mouth 2 (two) times daily with a meal.    . gemfibrozil (LOPID) 600 MG tablet Take 600 mg by mouth in the morning and at bedtime.    . metFORMIN (GLUCOPHAGE) 1000 MG tablet Take 1,000 mg by mouth 2 (two) times daily with a meal.     . rosuvastatin (CRESTOR) 20 MG tablet Take 20 mg by mouth at bedtime.    Marland Kitchen warfarin (COUMADIN) 10 MG tablet Take 10 mg by mouth daily.     No current facility-administered medications for this visit.    PHYSICAL EXAMINATION: ECOG PERFORMANCE STATUS: 0 - Asymptomatic  Vitals:   05/13/20 0921  BP: 119/79  Pulse: 60  Resp: 18  Temp: (!) 97.3 F (36.3 C)  SpO2: 100%   Filed Weights   05/13/20 0921  Weight: 182 lb 8 oz (82.8 kg)    LABORATORY DATA:  I have reviewed the data as listed CMP Latest Ref Rng & Units 03/14/2020 01/25/2020 01/24/2020  Glucose 70 - 99 mg/dL 222(L) 798(X) 211(H)  BUN 6 - 20 mg/dL 13 8 5(L)  Creatinine  0.61 - 1.24 mg/dL 4.17 4.08 1.44  Sodium 135 - 145 mmol/L 136 138 142  Potassium 3.5 - 5.1 mmol/L 4.3 4.5 4.4  Chloride 98 - 111 mmol/L 105 104 108  CO2 22 - 32 mmol/L 19(L) 26 25  Calcium 8.9 - 10.3 mg/dL 9.3 9.4 9.2  Total Protein 6.5 - 8.1 g/dL 6.5 - -  Total Bilirubin 0.3 - 1.2 mg/dL 0.6 - -  Alkaline Phos 38 - 126 U/L 30(L) - -  AST 15 - 41 U/L 11(L) - -  ALT 0 - 44 U/L 13 - -    Lab Results  Component Value Date   WBC 5.5 05/13/2020   HGB 13.1 05/13/2020   HCT 39.9 05/13/2020   MCV 83.0 05/13/2020   PLT 298 05/13/2020   NEUTROABS 3.5 05/13/2020    ASSESSMENT & PLAN:  Iron deficiency anemia 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  Bone marrow aspiration biopsy: 03/20/2020 Cellular 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, 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, storage iron decreased, cytogenetics are Normal ------------------------------------------------------------------------------------------------------------------------------------------- Lab review:  01/25/2020: Hemoglobin 9.6, MCV 88.9, RDW 16.4, ferritin 33 Ferritin was 6 on 01/19/2020 03/04/2020: Hemoglobin 8.3, MCV 89.4, RDW 19.2, ferritin 88 03/06/2020: TIBC 444, iron saturation 46%, ferritin 62, DAT negative, hemoglobin 8.1, SPEP: No M protein, LDH 191, haptoglobin 32, erythropoietin 169 04/07/2020: Hemoglobin 10.2 05/13/2020: Hemoglobin 13.1  Possible etiology blood loss  Continue with B12 replacement therapies.  We will cut down the iron to once a day and then stop it in 3 months.  Return to clinic on an as-needed basis    No orders of the defined types were placed in this encounter.  The patient has a good understanding of the overall plan. he agrees with it. he will call with  any problems that may develop before the next visit here.  Total time spent: 20 mins including face to face time and time spent for planning, charting and coordination of care  Serena Croissant, MD 05/13/2020  I, Kirt Boys Dorshimer, am acting as scribe for Dr. Serena Croissant.  I have reviewed the above documentation for accuracy and completeness, and I agree with the above.

## 2020-05-13 ENCOUNTER — Inpatient Hospital Stay: Payer: BC Managed Care – PPO

## 2020-05-13 ENCOUNTER — Other Ambulatory Visit: Payer: Self-pay

## 2020-05-13 ENCOUNTER — Inpatient Hospital Stay: Payer: BC Managed Care – PPO | Attending: Hematology and Oncology | Admitting: Hematology and Oncology

## 2020-05-13 DIAGNOSIS — Z79899 Other long term (current) drug therapy: Secondary | ICD-10-CM | POA: Diagnosis not present

## 2020-05-13 DIAGNOSIS — Z7901 Long term (current) use of anticoagulants: Secondary | ICD-10-CM | POA: Insufficient documentation

## 2020-05-13 DIAGNOSIS — K922 Gastrointestinal hemorrhage, unspecified: Secondary | ICD-10-CM | POA: Insufficient documentation

## 2020-05-13 DIAGNOSIS — D500 Iron deficiency anemia secondary to blood loss (chronic): Secondary | ICD-10-CM | POA: Insufficient documentation

## 2020-05-13 LAB — CBC WITH DIFFERENTIAL (CANCER CENTER ONLY)
Abs Immature Granulocytes: 0.06 10*3/uL (ref 0.00–0.07)
Basophils Absolute: 0.1 10*3/uL (ref 0.0–0.1)
Basophils Relative: 1 %
Eosinophils Absolute: 0.2 10*3/uL (ref 0.0–0.5)
Eosinophils Relative: 4 %
HCT: 39.9 % (ref 39.0–52.0)
Hemoglobin: 13.1 g/dL (ref 13.0–17.0)
Immature Granulocytes: 1 %
Lymphocytes Relative: 20 %
Lymphs Abs: 1.1 10*3/uL (ref 0.7–4.0)
MCH: 27.2 pg (ref 26.0–34.0)
MCHC: 32.8 g/dL (ref 30.0–36.0)
MCV: 83 fL (ref 80.0–100.0)
Monocytes Absolute: 0.6 10*3/uL (ref 0.1–1.0)
Monocytes Relative: 10 %
Neutro Abs: 3.5 10*3/uL (ref 1.7–7.7)
Neutrophils Relative %: 64 %
Platelet Count: 298 10*3/uL (ref 150–400)
RBC: 4.81 MIL/uL (ref 4.22–5.81)
RDW: 15.9 % — ABNORMAL HIGH (ref 11.5–15.5)
WBC Count: 5.5 10*3/uL (ref 4.0–10.5)
nRBC: 0 % (ref 0.0–0.2)

## 2020-05-13 LAB — VITAMIN B12: Vitamin B-12: 639 pg/mL (ref 180–914)

## 2020-05-13 LAB — FERRITIN: Ferritin: 31 ng/mL (ref 24–336)

## 2020-05-13 LAB — IRON AND TIBC
Iron: 84 ug/dL (ref 42–163)
Saturation Ratios: 19 % — ABNORMAL LOW (ref 20–55)
TIBC: 444 ug/dL — ABNORMAL HIGH (ref 202–409)
UIBC: 360 ug/dL (ref 117–376)

## 2020-05-13 LAB — PROTIME-INR: INR: 3.5 — AB (ref 0.9–1.1)

## 2020-05-13 NOTE — Assessment & Plan Note (Signed)
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  Bone marrow aspiration biopsy: 03/20/2020 Cellular 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, 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, storage iron decreased, cytogenetics are Normal ------------------------------------------------------------------------------------------------------------------------------------------- Lab review: 01/25/2020: Hemoglobin 9.6, MCV 88.9, RDW 16.4, ferritin 33 Ferritin was 6 on 01/19/2020 03/04/2020: Hemoglobin 8.3, MCV 89.4, RDW 19.2, ferritin 88 03/06/2020: TIBC 444, iron saturation 46%, ferritin 62, DAT negative, hemoglobin 8.1, SPEP: No M protein, LDH 191, haptoglobin 32, erythropoietin 169 04/07/2020: Hemoglobin 10.2 05/13/2020:  Continue with oral iron and B12 replacement therapies. Return to clinic in 1 month for follow-up with labs. If his hemoglobin becomes normal at that time then we can see him in 6 months and after that as-needed basis.

## 2020-05-14 ENCOUNTER — Encounter: Payer: Self-pay | Admitting: Family Medicine

## 2020-05-14 ENCOUNTER — Ambulatory Visit (INDEPENDENT_AMBULATORY_CARE_PROVIDER_SITE_OTHER): Payer: BC Managed Care – PPO | Admitting: Family Medicine

## 2020-05-14 ENCOUNTER — Telehealth: Payer: Self-pay | Admitting: Hematology and Oncology

## 2020-05-14 VITALS — BP 120/80 | HR 70 | Temp 98.3°F | Ht 67.0 in | Wt 182.0 lb

## 2020-05-14 DIAGNOSIS — M25511 Pain in right shoulder: Secondary | ICD-10-CM

## 2020-05-14 MED ORDER — METHYLPREDNISOLONE ACETATE 40 MG/ML IJ SUSP
40.0000 mg | Freq: Once | INTRAMUSCULAR | Status: AC
  Administered 2020-05-14: 40 mg via INTRAMUSCULAR

## 2020-05-14 NOTE — Telephone Encounter (Signed)
Per 8/31 los, no changes made to pt schedule

## 2020-05-14 NOTE — Progress Notes (Signed)
Established Patient Office Visit  Subjective:  Patient ID: Dennis Bates, male    DOB: 1960-09-25  Age: 59 y.o. MRN: [id number]  CC:  Chief Complaint  Patient presents with  . Shoulder Pain    pt present c/o of  day sx of shouler pain. Pt unable to elevate R shoulder and flex R arm. Pt stated that the pain is a throbbing pain thats not getting better. Pt has tried arm sling with mild relief. Pt not sure what caused the issue.     HPI [name] presents for right shoulder pain.  Acute onset about 5 days ago.  Did a lot of yard work but denies any specific injury.  No neck pain.  No prior history of similar problem.  He has pain with abduction and internal rotation.  He had pain with minimal movement past couple of days.  No definite weakness or numbness.  He takes Coumadin and thus cannot take nonsteroidals.  Has not tried any icing.  Just recently establish care.  He has complicated past history with history of CAD, history of severe anemia which is currently corrected, type 2 diabetes, chronic anticoagulation, dyslipidemia.  Has been followed closely by the hematology and recent hemoglobin up in the 13 range.  No recent chest pains.  Past Medical History:  Diagnosis Date  . Aortic stenosis due to bicuspid aortic valve    Severe aortic stenosis of bicuspid valve - s/pAVR with Bentall ( 09/2015)  . Bell's palsy    LAST EPISODE 09-2015  . CAD S/P percutaneous coronary angioplasty 09/20/2014   a. inferolat STEMI ->> LHC-Angio: Proximal/ostial LAD 20%, OM2 99% -->> PCI: 80mm x 16 mm Promus Premier DES to the OM2.(~3.5 mm)  . Diabetes mellitus type 2, controlled, with complications (HCC)   . Hyperlipidemia   . Hypertension   . Hypertriglyceridemia   . ST elevation myocardial infarction (STEMI) of inferior wall (HCC) 09/20/2014   99 % occluded Cx-OM2 Promus DES 3.0 mm x 16 mm (3.5 mm)    Past Surgical History:  Procedure Laterality Date  . BENTALL PROCEDURE N/A 10/09/2015    Procedure: BENTALL PROCEDURE (using a St Jude mechanical valve, size 23);  Surgeon: Alleen Borne, MD;  Location: [facility] OR;  Service: Open Heart Surgery;  Laterality: N/A;  CIRC ARRESTNEEDS RIGHT RADIAL A-LINE  . COLONOSCOPY WITH PROPOFOL N/A 01/21/2020   Procedure: COLONOSCOPY WITH PROPOFOL;  Surgeon: Lynann Bologna, MD;  Location: [facility] ENDOSCOPY;  Service: Endoscopy;  Laterality: N/A;  . ENTEROSCOPY N/A 01/24/2020   Procedure: ENTEROSCOPY;  Surgeon: Sherrilyn Rist, MD;  Location: [facility] ENDOSCOPY;  Service: Gastroenterology;  Laterality: N/A;  . ESOPHAGOGASTRODUODENOSCOPY (EGD) WITH PROPOFOL N/A 01/21/2020   Procedure: ESOPHAGOGASTRODUODENOSCOPY (EGD) WITH PROPOFOL;  Surgeon: Lynann Bologna, MD;  Location: [facility] ENDOSCOPY;  Service: Endoscopy;  Laterality: N/A;  . GIVENS CAPSULE STUDY N/A 01/22/2020   Procedure: GIVENS CAPSULE STUDY;  Surgeon: Sherrilyn Rist, MD;  Location: [facility] ENDOSCOPY;  Service: Gastroenterology;  Laterality: N/A;  . KNEE ARTHROSCOPY W/ ACL RECONSTRUCTION Right    90's; "2 ATHROSCOPIC , 2 REBUILDS"  . LEFT HEART CATHETERIZATION WITH CORONARY ANGIOGRAM N/A 09/20/2014   Procedure: LEFT HEART CATHETERIZATION WITH CORONARY ANGIOGRAM;  Surgeon: Marykay Lex, MD;  Location: [facility] CATH LAB;  Proximal/ostial LAD 20%, OM2 99% -->> aspiration thrombectomy and DES PCI:   . PERCUTANEOUS CORONARY STENT INTERVENTION (PCI-S)  09/20/2014   Aspiration thrombectomy and DES PCI- OM 2(m-dCx): Promus Premier DES 3.0 mm x 16  mm (3.5 mm)  . RIGHT/LEFT HEART CATH AND CORONARY ANGIOGRAPHY  09/2015   Dr. Nicki Guadalajara: Widely patent OM 2 stent.  Mild disease in RCA and LAD.Relatively normal right heart cath pressures.  Normal cardiac output.  . TEE WITHOUT CARDIOVERSION N/A 10/09/2015   Procedure: TRANSESOPHAGEAL ECHOCARDIOGRAM (TEE);  Surgeon: Alleen Borne, MD;  Location: [facility] OR;  Service: Open Heart Surgery;  Laterality: N/A;  . TOTAL KNEE ARTHROPLASTY Right 12/09/2017   Procedure: RIGHT TOTAL KNEE ARTHROPLASTY  REMOVAL OF HARDWARE RIGHT KNEE;  Surgeon: Jodi Geralds, MD;  Location: [facility] ORS;  Service: Orthopedics;  Laterality: Right;  . TRANSTHORACIC ECHOCARDIOGRAM  02/2016   Post AVR: mechanical: AVR without obstuction. LVEF improved to 65-70%.  [name] ECHOCARDIOGRAM  January 2016, February2016   Probable bicuspid aortic valve: a. mod-sev by 2D ECHO 09/2014. AVA 0.9cm^2; b) 10/2014 Echo:. Severe AS Mn-Pk Gradient 41-71 mmHg, AVA ~0.79 cm2, EF 60-65%    Family History  Problem Relation Age of Onset  . Heart disease Mother   . CAD Neg Hx        per wife    Social History   Socioeconomic History  . Marital status: Married    Spouse name: Not on file  . Number of children: Not on file  . Years of education: Not on file  . Highest education level: Not on file  Occupational History  . Not on file  Tobacco Use  . Smoking status: Never Smoker  . Smokeless tobacco: Former Neurosurgeon    Types: Engineer, drilling  . Vaping Use: Never used  Substance and Sexual Activity  . Alcohol use: Yes    Alcohol/week: 0.0 standard drinks    Comment: socially  . Drug use: No  . Sexual activity: Not on file  Other Topics Concern  . Not on file  Social History Narrative  . Not on file   Social Determinants of Health   Financial Resource Strain:   . Difficulty of Paying Living Expenses: Not on file  Food Insecurity:   . Worried About Programme researcher, broadcasting/film/video in the Last Year: Not on file  . Ran Out of Food in the Last Year: Not on file  Transportation Needs:   . Lack of Transportation (Medical): Not on file  . Lack of Transportation (Non-Medical): Not on file  Physical Activity:   . Days of Exercise per Week: Not on file  . Minutes of Exercise per Session: Not on file  Stress:   . Feeling of Stress : Not on file  Social Connections:   . Frequency of Communication with Friends and Family: Not on file  . Frequency of Social Gatherings with Friends and Family: Not on file  . Attends Religious Services:  Not on file  . Active Member of Clubs or Organizations: Not on file  . Attends Banker Meetings: Not on file  . Marital Status: Not on file  Intimate Partner Violence:   . Fear of Current or Ex-Partner: Not on file  . Emotionally Abused: Not on file  . Physically Abused: Not on file  . Sexually Abused: Not on file    Outpatient Medications Prior to Visit  Medication Sig Dispense Refill  . acetaminophen (TYLENOL) 325 MG tablet Take 2 tablets (650 mg total) by mouth every 6 (six) hours as needed for mild pain.    . ferrous sulfate 325 (65 FE) MG tablet Take 325 mg by mouth 2 (two) times daily with a meal.    .  gemfibrozil (LOPID) 600 MG tablet Take 600 mg by mouth in the morning and at bedtime.    . metFORMIN (GLUCOPHAGE) 1000 MG tablet Take 1,000 mg by mouth 2 (two) times daily with a meal.     . rosuvastatin (CRESTOR) 20 MG tablet Take 20 mg by mouth at bedtime.    Marland Kitchen. warfarin (COUMADIN) 10 MG tablet Take 10 mg by mouth daily.    . ASPIRIN 81 PO Take 1 tablet by mouth daily.     . carvedilol (COREG) 3.125 MG tablet Take 1 tablet (3.125 mg total) by mouth 2 (two) times daily. 60 tablet 3   No facility-administered medications prior to visit.    No Known Allergies  ROS Review of Systems  Constitutional: Negative for chills and fever.  Respiratory: Negative for shortness of breath.   Cardiovascular: Negative for chest pain.  Musculoskeletal: Negative for neck pain.  Neurological: Negative for weakness and numbness.      Objective:    Physical Exam Vitals reviewed.  Constitutional:      Appearance: Normal appearance.  Cardiovascular:     Rate and Rhythm: Normal rate and regular rhythm.  Pulmonary:     Effort: Pulmonary effort is normal.     Breath sounds: Normal breath sounds.  Musculoskeletal:     Comments: Right shoulder reveals no visible swelling.  No acromioclavicular tenderness.  He does have some subacromial tenderness.  No warmth.  No erythema.    Neurological:     Mental Status: He is alert.     BP 120/80   Pulse 70   Temp 98.3 F (36.8 C) (Other (Comment))   Ht 5\' 7"  (1.702 m)   Wt 182 lb (82.6 kg)   SpO2 99%   BMI 28.51 kg/m  Wt Readings from Last 3 Encounters:  05/14/20 182 lb (82.6 kg)  05/13/20 182 lb 8 oz (82.8 kg)  04/07/20 183 lb 8 oz (83.2 kg)     Health Maintenance Due  Topic Date Due  . Hepatitis C Screening  Never done  . FOOT EXAM  Never done  . URINE MICROALBUMIN  Never done  . INFLUENZA VACCINE  Never done    There are no preventive care reminders to display for this patient.  Lab Results  Component Value Date   TSH 3.683 03/06/2020   Lab Results  Component Value Date   WBC 5.5 05/13/2020   HGB 13.1 05/13/2020   HCT 39.9 05/13/2020   MCV 83.0 05/13/2020   PLT 298 05/13/2020   Lab Results  Component Value Date   NA 136 03/14/2020   K 4.3 03/14/2020   CO2 19 (L) 03/14/2020   GLUCOSE 357 (H) 03/14/2020   BUN 13 03/14/2020   CREATININE 0.92 03/14/2020   BILITOT 0.6 03/14/2020   ALKPHOS 30 (L) 03/14/2020   AST 11 (L) 03/14/2020   ALT 13 03/14/2020   PROT 6.5 03/14/2020   ALBUMIN 3.9 03/14/2020   CALCIUM 9.3 03/14/2020   ANIONGAP 12 03/14/2020   Lab Results  Component Value Date   CHOL 110 (L) 02/05/2016   Lab Results  Component Value Date   HDL 30 (L) 02/05/2016   Lab Results  Component Value Date   LDLCALC 46 02/05/2016   Lab Results  Component Value Date   TRIG 172 (H) 02/05/2016   Lab Results  Component Value Date   CHOLHDL 3.7 02/05/2016   Lab Results  Component Value Date   HGBA1C 5.9 (H) 01/19/2020      Assessment &  Plan:   Acute right shoulder pain following yard work.  No specific injury.  Question subacromial bursitis versus tendinitis   Discussed risks and benefits of corticosteroid injection (including risk of bleeding, bruising, low risk of infection) and patient consented.  After prepping skin with betadine, injected 40 mg depomedrol and 2 cc of  plain xylocaine with 25 gauge one and one half inch needle using posterior lateral approach and pt tolerated well.  -We recommend try some icing 15 to 20 minutes 2-3 times daily -May continue Tylenol -Touch base in 1 week if not improved.  Consider sports medicine referral if not improved with injection   No orders of the defined types were placed in this encounter.   Follow-up: No follow-ups on file.    Evelena Peat, MD

## 2020-05-14 NOTE — Patient Instructions (Signed)
Rotator Cuff Tendinitis  Rotator cuff tendinitis is inflammation of the tough, cord-like bands that connect muscle to bone (tendons) in the rotator cuff. The rotator cuff includes all of the muscles and tendons that connect the arm to the shoulder. The rotator cuff holds the head of the upper arm bone (humerus) in the cup (fossa) of the shoulder blade (scapula). This condition can lead to a long-lasting (chronic) tear. The tear may be partial or complete. What are the causes? This condition is usually caused by overusing the rotator cuff. What increases the risk? This condition is more likely to develop in athletes and workers who frequently use their shoulder or reach over their heads. This can include activities such as:  Tennis.  Baseball or softball.  Swimming.  Construction work.  Painting. What are the signs or symptoms? Symptoms of this condition include:  Pain spreading (radiating) from the shoulder to the upper arm.  Swelling and tenderness in front of the shoulder.  Pain when reaching, pulling, or lifting the arm above the head.  Pain when lowering the arm from above the head.  Minor pain in the shoulder when resting.  Increased pain in the shoulder at night.  Difficulty placing the arm behind the back. How is this diagnosed? This condition is diagnosed with a medical history and physical exam. Tests may also be done, including:  X-rays.  MRI.  Ultrasounds.  CT or MR arthrogram. During this test, a contrast material is injected and then images are taken. How is this treated? Treatment for this condition depends on the severity of the condition. In less severe cases, treatment may include:  Rest. This may be done with a sling that holds the shoulder still (immobilization). Your health care provider may also recommend avoiding activities that involve lifting your arm over your head.  Icing the shoulder.  Anti-inflammatory medicines, such as aspirin or  ibuprofen. In more severe cases, treatment may include:  Physical therapy.  Steroid injections.  Surgery. Follow these instructions at home: If you have a sling:  Wear the sling as told by your health care provider. Remove it only as told by your health care provider.  Loosen the sling if your fingers tingle, become numb, or turn cold and blue.  Keep the sling clean.  If the sling is not waterproof, do not let it get wet. Remove it, if allowed, or cover it with a watertight covering when you take a bath or shower. Managing pain, stiffness, and swelling  If directed, put ice on the injured area. ? If you have a removable sling, remove it as told by your health care provider. ? Put ice in a plastic bag. ? Place a towel between your skin and the bag. ? Leave the ice on for 20 minutes, 2-3 times a day.  Move your fingers often to avoid stiffness and to lessen swelling.  Raise (elevate) the injured area above the level of your heart while you are lying down.  Find a comfortable sleeping position or sleep on a recliner, if available. Driving  Do not drive or use heavy machinery while taking prescription pain medicine.  Ask your health care provider when it is safe to drive if you have a sling on your arm. Activity  Rest your shoulder as told by your health care provider.  Return to your normal activities as told by your health care provider. Ask your health care provider what activities are safe for you.  Do any exercises or stretches as  told by your health care provider.  If you do repetitive overhead tasks, take small breaks in between and include stretching exercises as told by your health care provider. General instructions  Do not use any products that contain nicotine or tobacco, such as cigarettes and e-cigarettes. These can delay healing. If you need help quitting, ask your health care provider.  Take over-the-counter and prescription medicines only as told by your  health care provider.  Keep all follow-up visits as told by your health care provider. This is important. Contact a health care provider if:  Your pain gets worse.  You have new pain in your arm, hands, or fingers.  Your pain is not relieved with medicine or does not get better after 6 weeks of treatment.  You have cracking sensations when moving your shoulder in certain directions.  You hear a snapping sound after using your shoulder, followed by severe pain and weakness. Get help right away if:  Your arm, hand, or fingers are numb or tingling.  Your arm, hand, or fingers are swollen or painful or they turn white or blue. Summary  Rotator cuff tendinitis is inflammation of the tough, cord-like bands that connect muscle to bone (tendons) in the rotator cuff.  This condition is usually caused by overusing the rotator cuff, which includes all of the muscles and tendons that connect the arm to the shoulder.  This condition is more likely to develop in athletes and workers who frequently use their shoulder or reach over their heads.  Treatment generally includes rest, anti-inflammatory medicines, and icing. In some cases, physical therapy and steroid injections may be needed. In severe cases, surgery may be needed. This information is not intended to replace advice given to you by your health care provider. Make sure you discuss any questions you have with your health care provider. Document Revised: 12/22/2018 Document Reviewed: 08/16/2016 Elsevier Patient Education  2020 Elsevier Inc[employer]  Try some icing 15 to 20 minutes several times daily  Touch base in 2 to 3 days if not seeing improvement

## 2020-05-16 ENCOUNTER — Telehealth: Payer: Self-pay | Admitting: Family Medicine

## 2020-05-16 DIAGNOSIS — M25511 Pain in right shoulder: Secondary | ICD-10-CM

## 2020-05-16 NOTE — Telephone Encounter (Signed)
Pt stated the inj he received on 9/1 helped some but he is still having issues and would like for PCP to send referral to Sports medicine. He stated he was driving home yesterday and he could barely keep his arm up on the wheel.    Pt can be reached at [phone]

## 2020-05-21 NOTE — Telephone Encounter (Signed)
Please advise 

## 2020-05-22 NOTE — Telephone Encounter (Signed)
Go ahead with sports medicine referral.  May need some imaging to further clarify.

## 2020-05-22 NOTE — Telephone Encounter (Signed)
Referral placed! Pt notified of update.  

## 2020-05-26 ENCOUNTER — Ambulatory Visit (INDEPENDENT_AMBULATORY_CARE_PROVIDER_SITE_OTHER): Payer: BC Managed Care – PPO | Admitting: General Practice

## 2020-05-26 DIAGNOSIS — Q230 Congenital stenosis of aortic valve: Secondary | ICD-10-CM | POA: Diagnosis not present

## 2020-05-26 DIAGNOSIS — Z7901 Long term (current) use of anticoagulants: Secondary | ICD-10-CM

## 2020-05-26 DIAGNOSIS — Q231 Congenital insufficiency of aortic valve: Secondary | ICD-10-CM

## 2020-05-26 LAB — POCT INR: INR: 3.4 — AB (ref 2.0–3.0)

## 2020-05-26 NOTE — Patient Instructions (Signed)
Pre visit review using our clinic review tool, if applicable. No additional management support is needed unless otherwise documented below in the visit note.  Continue to take 1 tablet daily except 1 1/2 tablets on Mondays.  Re-check in 2 weeks.  Patient will be checking with MD INR. LMOVM

## 2020-05-28 LAB — POCT INR: INR: 3.6 — AB (ref 2.0–3.0)

## 2020-05-29 ENCOUNTER — Other Ambulatory Visit: Payer: Self-pay

## 2020-05-29 ENCOUNTER — Ambulatory Visit: Payer: Self-pay

## 2020-05-29 ENCOUNTER — Ambulatory Visit (INDEPENDENT_AMBULATORY_CARE_PROVIDER_SITE_OTHER): Payer: BC Managed Care – PPO

## 2020-05-29 ENCOUNTER — Encounter: Payer: Self-pay | Admitting: Family Medicine

## 2020-05-29 ENCOUNTER — Ambulatory Visit: Payer: BC Managed Care – PPO | Admitting: Family Medicine

## 2020-05-29 VITALS — BP 120/84 | HR 90 | Ht 67.0 in | Wt 180.0 lb

## 2020-05-29 DIAGNOSIS — M25511 Pain in right shoulder: Secondary | ICD-10-CM

## 2020-05-29 DIAGNOSIS — M7501 Adhesive capsulitis of right shoulder: Secondary | ICD-10-CM | POA: Diagnosis not present

## 2020-05-29 NOTE — Progress Notes (Signed)
Subjective:   I, Dennis Bates, am serving as a scribe for Dr. Clementeen Graham. This visit occurred during the SARS-CoV-2 public health emergency.  Safety protocols were in place, including screening questions prior to the visit, additional usage of staff PPE, and extensive cleaning of exam room while observing appropriate contact time as indicated for disinfecting solutions.   I'm seeing this patient as a consultation for:  Dr. Caryl Never. Note will be routed back to referring provider/PCP.  CC: R shoulder pain  HPI: Pt is a 59 y/o male presenting w/ c/o R shoulder pain since late Aug 2021 after doing some yardwork.  He locates his pain to middle deltoid. Feels like he is unable to raise arm overhead. Using Tylenol and topical analgesics. PCP gave subacromial injection  On 05/14/2020 but did not have any relief.   Radiating pain: No R shoulder mechanical symptoms: pain with flexion Aggravating factors: R shoulder aBd and IR;  Treatments tried:Tylenol, Blue Emu  Past medical history, Surgical history, Family history, Social history, Allergies, and medications have been entered into the medical record, reviewed.   Review of Systems: No new headache, visual changes, nausea, vomiting, diarrhea, constipation, dizziness, abdominal pain, skin rash, fevers, chills, night sweats, weight loss, swollen lymph nodes, body aches, joint swelling, muscle aches, chest pain, shortness of breath, mood changes, visual or auditory hallucinations.   Objective:    Vitals:   05/29/20 1237  BP: 120/84  Pulse: 90  SpO2: 97%   General: Well Developed, well nourished, and in no acute distress.  Neuro/Psych: Alert and oriented x3, extra-ocular muscles intact, able to move all 4 extremities, sensation grossly intact. Skin: Warm and dry, no rashes noted.  Respiratory: Not using accessory muscles, speaking in full sentences, trachea midline.  Cardiovascular: Pulses palpable, no extremity edema. Abdomen: Does not appear  distended. MSK: Right shoulder normal-appearing Nontender. Range of motion abduction 100 degrees.  External rotation 10 degrees by neutral position internal rotation iliac crest. Strength intact within limits of motion. Unable to get into correct positioning for Hawkins or Neer's test. Negative Yergason's and speeds test.  Lab and Radiology Results Diagnostic Limited MSK Ultrasound of: Right shoulder Biceps tendon difficult to visualize.  Hypoechoic and hyperechoic structure within the bicipital groove.  Possible tear or partial thickness tear biceps tendon. Subscapularis tendon not very visible due to inability to fully externally rotate. Supraspinatus tendon is intact appearing moderate subacromial bursa thickness present. Infraspinatus tendon normal-appearing AC joint degenerative. Impression: Possible subacromial bursitis.  Possible biceps tendon tear versus chronic tendinitis  X-ray images right shoulder obtained today personally independently reviewed.   Moderate glenohumeral DJD.  High riding humeral head.  AC DJD. Await formal radiology review  Impression and Recommendations:    Assessment and Plan: 59 y.o. male with right shoulder pain.  Likely to be adhesive capsulitis and also some glenohumeral DJD based on x-ray.  Patient did not have a lot of pain until recently making the glenohumeral DJD seen on x-ray less likely to be the main cause of his pain.  Plan for physical therapy trial and Voltaren gel.  Consider glenohumeral steroid injection in future if not better however would like to avoid that based on his diabetes.  Recheck back in 6 weeks.Marland Kitchen  PDMP not reviewed this encounter. Orders Placed This Encounter  Procedures  . [place] LIMITED JOINT SPACE STRUCTURES UP RIGHT(NO LINKED CHARGES)    Order Specific Question:   Reason for Exam (SYMPTOM  OR DIAGNOSIS REQUIRED)    Answer:  right shoulder pain    Order Specific Question:   Preferred imaging location?    Answer:   Adult nurse  [facility]  . DG Shoulder Right    Standing Status:   Future    Number of Occurrences:   1    Standing Expiration Date:   05/29/2021    Order Specific Question:   Reason for Exam (SYMPTOM  OR DIAGNOSIS REQUIRED)    Answer:   eval shoulder pain    Order Specific Question:   Preferred imaging location?    Answer:   [facility]    Order Specific Question:   Radiology Contrast Protocol - do NOT remove file path    Answer:   [email or web address]  . Ambulatory referral to Physical Therapy    Referral Priority:   Routine    Referral Type:   Physical Medicine    Referral Reason:   Specialty Services Required    Requested Specialty:   Physical Therapy    Number of Visits Requested:   1   No orders of the defined types were placed in this encounter.   Discussed warning signs or symptoms. Please see discharge instructions. Patient expresses understanding.   The above documentation has been reviewed and is accurate and complete Clementeen Graham, M.D.

## 2020-05-29 NOTE — Patient Instructions (Signed)
Thank you for coming in today. I think you have frozen shoulder.  Get xray today.  Plan for PT.  Use voltaren gel in addition to your tylenol.  Heat is ok.   Recheck in 6 weeks.    Adhesive Capsulitis  Adhesive capsulitis, also called frozen shoulder, causes the shoulder to become stiff and painful to move. This condition happens when there is inflammation of the tendons and ligaments that surround the shoulder joint (shoulder capsule). What are the causes? This condition may be caused by:  An injury to your shoulder joint.  Straining your shoulder.  Not moving your shoulder for a period of time. This can happen if your arm was injured or in a sling.  Long-standing conditions, such as: ? Diabetes. ? Thyroid problems. ? Heart disease. ? Stroke. ? Rheumatoid arthritis. ? Lung disease. In some cases, the cause is not known. What increases the risk? You are more likely to develop this condition if you are:  A woman.  Older than 58 years of age. What are the signs or symptoms? Symptoms of this condition include:  Pain in your shoulder when you move your arm. There may also be pain when parts of your shoulder are touched. The pain may be worse at night or when you are resting.  A sore or aching shoulder.  The inability to move your shoulder normally.  Muscle spasms. How is this diagnosed? This condition is diagnosed with a physical exam and imaging tests, such as an X-ray or MRI. How is this treated? This condition may be treated with:  Treatment of the underlying cause or condition.  Medicine. Medicine may be given to relieve pain, inflammation, or muscle spasms.  Steroid injections into the shoulder joint.  Physical therapy. This involves performing exercises to get the shoulder moving again.  Acupuncture. This is a type of treatment that involves stimulating specific points on your body by inserting thin needles through your skin.  Shoulder manipulation. This  is a procedure to move the shoulder into another position. It is done after you are given a medicine to make you fall asleep (general anesthetic). The joint may also be injected with salt water at high pressure to break down scarring.  Surgery. This may be done in severe cases when other treatments have failed. Although most people recover completely from adhesive capsulitis, some may not regain full shoulder movement. Follow these instructions at home: Managing pain, stiffness, and swelling      If directed, put ice on the injured area: ? Put ice in a plastic bag. ? Place a towel between your skin and the bag. ? Leave the ice on for 20 minutes, 2-3 times per day.  If directed, apply heat to the affected area before you exercise. Use the heat source that your health care provider recommends, such as a moist heat pack or a heating pad. ? Place a towel between your skin and the heat source. ? Leave the heat on for 20-30 minutes. ? Remove the heat if your skin turns bright red. This is especially important if you are unable to feel pain, heat, or cold. You may have a greater risk of getting burned. General instructions  Take over-the-counter and prescription medicines only as told by your health care provider.  If you are being treated with physical therapy, follow instructions from your physical therapist.  Avoid exercises that put a lot of demand on your shoulder, such as throwing. These exercises can make pain worse.  Keep  all follow-up visits as told by your health care provider. This is important. Contact a health care provider if:  You develop new symptoms.  Your symptoms get worse. Summary  Adhesive capsulitis, also called frozen shoulder, causes the shoulder to become stiff and painful to move.  You are more likely to have this condition if you are a woman and over age 15.  It is treated with physical therapy, medicines, and sometimes surgery. This information is not  intended to replace advice given to you by your health care provider. Make sure you discuss any questions you have with your health care provider. Document Revised: 02/03/2018 Document Reviewed: 02/03/2018 Elsevier Patient Education  2020 [employer].

## 2020-05-30 NOTE — Progress Notes (Signed)
X-ray right shoulder unfortunately does show worse arthritis than I was anticipating.  Radiology are rating it as severe.  This makes frozen shoulder less likely.  If not improving with physical therapy I think our next step is either injection or a total shoulder replacement even.

## 2020-06-04 LAB — POCT INR: INR: 3.1 — AB (ref 2.0–3.0)

## 2020-06-12 ENCOUNTER — Ambulatory Visit: Payer: Self-pay | Admitting: General Practice

## 2020-06-12 ENCOUNTER — Ambulatory Visit (INDEPENDENT_AMBULATORY_CARE_PROVIDER_SITE_OTHER): Payer: BC Managed Care – PPO | Admitting: General Practice

## 2020-06-12 DIAGNOSIS — Q231 Congenital insufficiency of aortic valve: Secondary | ICD-10-CM | POA: Diagnosis not present

## 2020-06-12 DIAGNOSIS — Z7901 Long term (current) use of anticoagulants: Secondary | ICD-10-CM

## 2020-06-12 DIAGNOSIS — Q230 Congenital stenosis of aortic valve: Secondary | ICD-10-CM

## 2020-06-12 LAB — POCT INR: INR: 3.4 — AB (ref 2.0–3.0)

## 2020-06-12 NOTE — Progress Notes (Signed)
Medical screening examination/treatment/procedure(s) were performed by non-physician practitioner and as supervising physician I was immediately available for consultation/collaboration. I agree with above. Burdett Pinzon, MD   

## 2020-06-12 NOTE — Patient Instructions (Signed)
Pre visit review using our clinic review tool, if applicable. No additional management support is needed unless otherwise documented below in the visit note.  Continue to take 1 tablet daily except 1 1/2 tablets on Mondays.  Re-check in 2 weeks.  Patient will be checking with [facility]. LMOVM  

## 2020-06-12 NOTE — Progress Notes (Signed)
Medical screening examination/treatment/procedure(s) were performed by non-physician practitioner and as supervising physician I was immediately available for consultation/collaboration. I agree with above. Shakita Keir, MD   

## 2020-06-18 LAB — POCT INR: INR: 2.3 (ref 2.0–3.0)

## 2020-06-26 ENCOUNTER — Other Ambulatory Visit: Payer: Self-pay | Admitting: General Practice

## 2020-06-26 ENCOUNTER — Telehealth: Payer: Self-pay | Admitting: Family Medicine

## 2020-06-26 DIAGNOSIS — Z7901 Long term (current) use of anticoagulants: Secondary | ICD-10-CM

## 2020-06-26 MED ORDER — WARFARIN SODIUM 10 MG PO TABS: ORAL_TABLET | ORAL | 1 refills | Status: DC

## 2020-06-26 NOTE — Telephone Encounter (Signed)
Patient requesting call to discuss warfarin (COUMADIN) 10 MG tablet He is completely out of medication  Please call

## 2020-07-11 ENCOUNTER — Ambulatory Visit (INDEPENDENT_AMBULATORY_CARE_PROVIDER_SITE_OTHER): Payer: BC Managed Care – PPO | Admitting: Family Medicine

## 2020-07-11 ENCOUNTER — Ambulatory Visit: Payer: Self-pay

## 2020-07-11 ENCOUNTER — Other Ambulatory Visit: Payer: Self-pay

## 2020-07-11 ENCOUNTER — Encounter: Payer: Self-pay | Admitting: Family Medicine

## 2020-07-11 VITALS — BP 128/80 | HR 74 | Ht 67.0 in | Wt 182.0 lb

## 2020-07-11 DIAGNOSIS — M7501 Adhesive capsulitis of right shoulder: Secondary | ICD-10-CM

## 2020-07-11 DIAGNOSIS — M19019 Primary osteoarthritis, unspecified shoulder: Secondary | ICD-10-CM

## 2020-07-11 HISTORY — DX: Primary osteoarthritis, unspecified shoulder: M19.019

## 2020-07-11 NOTE — Progress Notes (Signed)
   I, Christoper Fabian, LAT, ATC, am serving as scribe for Dr. Clementeen Graham.  Dennis Bates is a 59 y.o. male who presents to [facility] at [facility] today for f/u of R shoulder pain that began in late Aug 2021 after doing yardwork.  He was last seen by Dr. Denyse Amass on 05/29/20 and referred to PT at [facility].  He had a prior R subacromial injection w/his PCP on 05/14/20.  Since his last visit, pt reports that his R shoulder is improving and rates his improvement at 80-85%.  His R shoulder ROM has improved and he has less pain.  His main issue at this point is w/ reaching behind his body.  Diagnostic imaging: R shoulder XR- 05/29/20   Pertinent review of systems: No fevers or chills  Relevant historical information: CAD, diabetes   Exam:  BP 128/80 (BP Location: Right Arm, Patient Position: Sitting, Cuff Size: Normal)   Pulse 74   Ht 5\' 7"  (1.702 m)   Wt 182 lb (82.6 kg)   SpO2 97%   BMI 28.51 kg/m  General: Well Developed, well nourished, and in no acute distress.   MSK: Right shoulder normal-appearing nontender range of motion limited abduction to 150 degrees.  Internal rotation limited to lumbar spine. Strength intact.    Lab and Radiology Results DG Shoulder Right  Result Date: 05/30/2020 CLINICAL DATA:  Generalized shoulder pain and limited range of motion for 1 month. EXAM: RIGHT SHOULDER - 2+ VIEW COMPARISON:  None. FINDINGS: No fracture or dislocation. Severe degenerative change the right glenohumeral joint with joint space loss, subchondral sclerosis and inferiorly directed osteophytosis. There is a peripherally corticated ossicle which measures approximately 1.2 cm about the anteromedial aspect of the glenohumeral joint which likely represents a displaced loose body though a discrete donor site is not identified. Mild degenerative change the right AC joint with joint space loss and subchondral sclerosis. No evidence of calcific tendinitis. Limited visualization of  the adjacent thorax demonstrates sequela of previous median sternotomy. IMPRESSION: 1. No acute findings. 2. 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 3. Mild degenerative change of the right AC joint. Electronically Signed   By: 06/01/2020 M.D.   On: 05/30/2020 08:31   06/01/2020 LIMITED JOINT SPACE STRUCTURES UP RIGHT(NO LINKED CHARGES)  Result Date: 06/05/2020 Diagnostic Limited MSK Ultrasound of: Right shoulder Biceps tendon difficult to visualize.  Hypoechoic and hyperechoic structure within the bicipital groove.  Possible tear or partial thickness tear biceps tendon. Subscapularis tendon not very visible due to inability to fully externally rotate. Supraspinatus tendon is intact appearing moderate subacromial bursa thickness present. Infraspinatus tendon normal-appearing AC joint degenerative. Impression: Possible subacromial bursitis.  Possible biceps tendon tear versus chronic tendinitis  I, 06/07/2020, personally (independently) visualized and performed the interpretation of the xray images attached in this note. Glenohumeral DJD severe places.    Assessment and Plan: 59 y.o. male with right shoulder pain significant improvement with physical therapy.  Pain due to glenohumeral DJD but also likely due to adhesive capsulitis and rotator cuff tendinitis.  Regardless has had significant improvement and is happy with how things are going.  Recheck back as needed and continue home exercise program.  If needed in the future would consider glenohumeral injection.     Discussed warning signs or symptoms. Please see discharge instructions. Patient expresses understanding.   The above documentation has been reviewed and is accurate and complete 42, M.D.

## 2020-07-11 NOTE — Patient Instructions (Signed)
Thank you for coming in today.  Plan to continue home exercises and wean out of formal PT.  Recheck with me as needed.  Could consider another cortisone shot in the future if needed.

## 2020-07-15 ENCOUNTER — Ambulatory Visit (INDEPENDENT_AMBULATORY_CARE_PROVIDER_SITE_OTHER): Payer: BC Managed Care – PPO | Admitting: General Practice

## 2020-07-15 DIAGNOSIS — Q230 Congenital stenosis of aortic valve: Secondary | ICD-10-CM | POA: Diagnosis not present

## 2020-07-15 DIAGNOSIS — Z7901 Long term (current) use of anticoagulants: Secondary | ICD-10-CM

## 2020-07-15 DIAGNOSIS — Q231 Congenital insufficiency of aortic valve: Secondary | ICD-10-CM | POA: Diagnosis not present

## 2020-07-15 NOTE — Progress Notes (Signed)
Medical screening examination/treatment/procedure(s) were performed by non-physician practitioner and as supervising physician I was immediately available for consultation/collaboration. I agree with above. Jasamine Pottinger, MD   

## 2020-07-15 NOTE — Patient Instructions (Signed)
Pre visit review using our clinic review tool, if applicable. No additional management support is needed unless otherwise documented below in the visit note.  Take extra 1/2 tablet today and then continue to take 1 tablet daily except 1 1/2 tablets on Mondays.  Re-check in 2 weeks.  Patient will be checking with [facility]. LMOVM  Coumadin clinic did not receive faxes from [facility] for the following dates: 10/6 - 2.4 10/14 - 2.9 10/20 - 2.6

## 2020-07-17 ENCOUNTER — Ambulatory Visit (INDEPENDENT_AMBULATORY_CARE_PROVIDER_SITE_OTHER): Payer: BC Managed Care – PPO | Admitting: General Practice

## 2020-07-17 DIAGNOSIS — Q231 Congenital insufficiency of aortic valve: Secondary | ICD-10-CM

## 2020-07-17 DIAGNOSIS — Q230 Congenital stenosis of aortic valve: Secondary | ICD-10-CM | POA: Diagnosis not present

## 2020-07-17 DIAGNOSIS — Z7901 Long term (current) use of anticoagulants: Secondary | ICD-10-CM | POA: Diagnosis not present

## 2020-07-17 LAB — POCT INR: INR: 1.6 — AB (ref 2.0–3.0)

## 2020-07-17 NOTE — Patient Instructions (Signed)
Pre visit review using our clinic review tool, if applicable. No additional management support is needed unless otherwise documented below in the visit note.  Take extra 1/2 tablet today, tomorrow and Saturday.  On Sunday continue to take 1 tablet daily except 1 1/2 tablets on Mondays.  Re-check in 1 week.  Patient will be checking with [facility]. LMOVM

## 2020-07-17 NOTE — Progress Notes (Signed)
Medical screening examination/treatment/procedure(s) were performed by non-physician practitioner and as supervising physician I was immediately available for consultation/collaboration. I agree with above. Zavior Thomason, MD   

## 2020-08-07 LAB — POCT INR: INR: 2.4 (ref 2.0–3.0)

## 2020-08-14 ENCOUNTER — Ambulatory Visit (INDEPENDENT_AMBULATORY_CARE_PROVIDER_SITE_OTHER): Payer: BC Managed Care – PPO | Admitting: General Practice

## 2020-08-14 ENCOUNTER — Ambulatory Visit: Payer: Self-pay | Admitting: General Practice

## 2020-08-14 DIAGNOSIS — Q230 Congenital stenosis of aortic valve: Secondary | ICD-10-CM | POA: Diagnosis not present

## 2020-08-14 DIAGNOSIS — Q231 Congenital insufficiency of aortic valve: Secondary | ICD-10-CM | POA: Diagnosis not present

## 2020-08-14 DIAGNOSIS — Z7901 Long term (current) use of anticoagulants: Secondary | ICD-10-CM

## 2020-08-14 LAB — POCT INR: INR: 3.3 — AB (ref 2.0–3.0)

## 2020-08-14 NOTE — Patient Instructions (Signed)
Pre visit review using our clinic review tool, if applicable. No additional management support is needed unless otherwise documented below in the visit note.  Take extra 1/2 tablet today and then continue to take 1 tablet daily except 1 1/2 tablets on Mondays.  Re-check in 1 week.  Patient will be checking with [facility]. LMOVM  11/11 - 2.4 11/17 - 2.0  The above results were reported on a monthly reports.  The weekly results are not being received.  Call made to [facility] to report this.

## 2020-08-14 NOTE — Progress Notes (Signed)
Medical screening examination/treatment/procedure(s) were performed by non-physician practitioner and as supervising physician I was immediately available for consultation/collaboration. I agree with above. Erion Hermans, MD   

## 2020-08-14 NOTE — Patient Instructions (Signed)
Pre visit review using our clinic review tool, if applicable. No additional management support is needed unless otherwise documented below in the visit note.  Continue to take 1 tablet daily except 1 1/2 tablets on Mondays.  Re-check in 1 week.  Patient will be checking with [facility]. LMOVM    

## 2020-08-21 ENCOUNTER — Ambulatory Visit (INDEPENDENT_AMBULATORY_CARE_PROVIDER_SITE_OTHER): Payer: Self-pay | Admitting: General Practice

## 2020-08-21 DIAGNOSIS — Q231 Congenital insufficiency of aortic valve: Secondary | ICD-10-CM

## 2020-08-21 DIAGNOSIS — Q230 Congenital stenosis of aortic valve: Secondary | ICD-10-CM

## 2020-08-21 DIAGNOSIS — Z7901 Long term (current) use of anticoagulants: Secondary | ICD-10-CM

## 2020-08-21 LAB — POCT INR: INR: 3.1 — AB (ref 2.0–3.0)

## 2020-08-21 NOTE — Patient Instructions (Signed)
Pre visit review using our clinic review tool, if applicable. No additional management support is needed unless otherwise documented below in the visit note.  Continue to take 1 tablet daily except 1 1/2 tablets on Mondays.  Re-check in 1 week.  Patient will be checking with [facility]. LMOVM

## 2020-08-21 NOTE — Progress Notes (Signed)
Medical screening examination/treatment/procedure(s) were performed by non-physician practitioner and as supervising physician I was immediately available for consultation/collaboration. I agree with above. Jeffory Snelgrove, MD   

## 2020-08-27 ENCOUNTER — Encounter: Payer: Self-pay | Admitting: Cardiology

## 2020-08-27 ENCOUNTER — Other Ambulatory Visit: Payer: Self-pay

## 2020-08-27 ENCOUNTER — Ambulatory Visit (INDEPENDENT_AMBULATORY_CARE_PROVIDER_SITE_OTHER): Payer: BC Managed Care – PPO | Admitting: Cardiology

## 2020-08-27 VITALS — BP 122/74 | HR 78 | Ht 67.0 in | Wt 187.0 lb

## 2020-08-27 DIAGNOSIS — E781 Pure hyperglyceridemia: Secondary | ICD-10-CM | POA: Diagnosis not present

## 2020-08-27 DIAGNOSIS — I2510 Atherosclerotic heart disease of native coronary artery without angina pectoris: Secondary | ICD-10-CM

## 2020-08-27 DIAGNOSIS — E785 Hyperlipidemia, unspecified: Secondary | ICD-10-CM

## 2020-08-27 DIAGNOSIS — E1169 Type 2 diabetes mellitus with other specified complication: Secondary | ICD-10-CM

## 2020-08-27 DIAGNOSIS — Z7901 Long term (current) use of anticoagulants: Secondary | ICD-10-CM

## 2020-08-27 DIAGNOSIS — Z9861 Coronary angioplasty status: Secondary | ICD-10-CM

## 2020-08-27 DIAGNOSIS — Z952 Presence of prosthetic heart valve: Secondary | ICD-10-CM

## 2020-08-27 LAB — POCT INR: INR: 2.9 (ref 2.0–3.0)

## 2020-08-27 MED ORDER — CARVEDILOL 3.125 MG PO TABS: 3.1250 mg | ORAL_TABLET | Freq: Two times a day (BID) | ORAL | 3 refills | Status: DC

## 2020-08-27 NOTE — Progress Notes (Signed)
Primary Care Provider: Eulas Post, MD Cardiologist: Glenetta Hew, MD Electrophysiologist: None  Clinic Note: Chief Complaint  Patient presents with  . Follow-up  . Coronary Artery Disease    No angina  . Cardiac Valve Problem    S/p mechanical AVR for bicuspid AS    Problem List Items Addressed This Visit    CAD S/P p DES PCI to the Circumflex-OM 2 Primary (Chronic)   Hx of prosthetic aortic valve replacement -mechanical; along with aortic root (Bental) (Chronic)   Dyslipidemia, goal LDL below 70 (Chronic)   Hypertriglyceridemia - Primary (Chronic)   Type 2 diabetes mellitus with hyperglycemia (HCC)      HPI:    Dennis Bates is a 59 y.o. male with a PMH notable for CAD-PCI as well as h/o Severe AS - /P BENTALL Procedure with Mechanical AoV (23 mm St Jude) below who presents today for ~ 8 month f/u   Cardiac History:  Inferior STEMI Jan 2016 (seen at PCP (Dr. Myriam Jacobson office for chest pain and dyspnea radiated left arm -> 2 mm inferolateral ST elevations) --> 100% Cx --> DES PCI OM2; ? His diagnostic cath showed significant aortic valve gradient on pullback and SEVERE AORTIC STENOSIS was confirmed by echo--severe bicuspid aortic valve stenosis  After 1 yr DAPT - Jan 2017 --> Mechanical AVR via Bentall procedure by Dr. Cyndia Bent. ? Preop cath showed widely patent stent and no other significant disease  Dennis Bates was last seen on December 17, 2019 for delayed follow-up.  Prior to that, had not been seen since December 2018.  He does have his knee surgery done.  Was on a little more than usual exertional dyspnea with climbing stairs or more vigorous activity.  No chest tightness or pressure. -> Intentional weight loss (able to exercise along with diet change after knee surgery)  Recent Hospitalizations:   01/18/2020: Admitted for GI bleed.  Hgb was down to 5.  Was given total of 3 units of PRBC to get Hgb above 8.  Coumadin and aspirin held. ->  Once stable,  discharged home on Lovenox bridge with warfarin.  Aspirin not restarted.  -->  Had amaurosis fugax or transient left eye vision loss.  Negative MRA of the head and neck with exception of moderate ostial vertebral artery stenoses.  C-Scope showed internal hemorrhoids with rare diverticuli.  EGD 10 mm nonbleeding diverticulum in the second part of the duodenum.  Capsule endoscopy showed small AVM in the reasonable.  CBC Latest Ref Rng & Units 08/28/2020 05/13/2020 04/07/2020  WBC 3.4 - 10.8 x10E3/uL 4.1 5.5 4.4  Hemoglobin 13.0 - 17.7 g/dL 14.5 13.1 10.2(L)  Hematocrit 37.5 - 51.0 % 40.8 39.9 32.6(L)  Platelets 150 - 450 x10E3/uL 285 298 343     Reviewed  CV studies:    The following studies were reviewed today: (if available, images/films reviewed: From Epic Chart or Care Everywhere) . 12/25/2019: EF 60-65%. Normal WM. Gr 2 DD. Mild LA dilation. 23 mm St. Jude Mechanical AoV well seated.  Trivial AI.   Interval History:   Dennis Bates here today doing overall pretty well.  Feeling better since his anemia has improved.  Apparently, I will after his bone marrow biopsy, his hemoglobin to start to respond.  No clear etiology for bleeding was identified.  Despite having significant anemia with his bleed, he denied any anginal chest pain-just noted worsening dyspnea.  With improved hemoglobin levels he is less dyspneic.  He still feels globally  fatigued.  Has not had any further TIA or amaurosis fugax symptoms.  No CHF symptoms of PND, orthopnea or edema.  No rapid irregular heartbeats or palpitations.  No syncope or near syncope.  The patient does not have symptoms concerning for COVID-19 infection (fever, chills, cough, or new shortness of breath).   REVIEWED OF SYSTEMS   Review of Systems  Constitutional: Negative for malaise/fatigue (Energy improved since his hemoglobin improved) and weight loss.  HENT: Negative for nosebleeds.   Respiratory: Negative for shortness of breath.    Cardiovascular: Negative for leg swelling.  Gastrointestinal: Negative for abdominal pain, blood in stool and melena.  Genitourinary: Negative for hematuria.  Musculoskeletal: Negative for back pain, falls and joint pain.  Neurological: Negative for dizziness, focal weakness and headaches.  Psychiatric/Behavioral: Negative for memory loss. The patient is not nervous/anxious and does not have insomnia.   I have reviewed and (if needed) personally updated the patient's problem list, medications, allergies, past medical and surgical history, social and family history.   PAST MEDICAL HISTORY   Past Medical History:  Diagnosis Date  . Aortic stenosis due to bicuspid aortic valve 09/2014   Severe aortic stenosis of bicuspid valve - s/pAVR with Bentall ( 09/2015)  . Bell's palsy    LAST EPISODE 09-2015  . CAD S/P percutaneous coronary angioplasty 09/20/2014   a. inferolat STEMI ->> LHC-Angio: Proximal/ostial LAD 20%, OM2 99% -->> PCI: 7m x 16 mm Promus Premier DES to the OM2.(~3.5 mm)  . Diabetes mellitus type 2, controlled, with complications ([place]   . History of mechanical aortic valve replacement 10/09/2015   after 1 yr DAPT for Inferolateral STEMI. (Dr. BCyndia Bent  . Hyperlipidemia   . Hypertension   . Hypertriglyceridemia   . 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 ST elevation myocardial infarction (STEMI) of inferior wall (HCC) 09/20/2014   99 % occluded Cx-OM2 Promus DES 3.0 mm x 16 mm (3.5 mm)    PAST SURGICAL HISTORY   Past Surgical History:  Procedure Laterality Date  . BENTALL PROCEDURE N/A 10/09/2015   Procedure: BENTALL PROCEDURE (using a St Jude mechanical valve, size 23);  Surgeon: BGaye Pollack MD;  [place]  Service: Open Heart Surgery;  Laterality: N/A;  CIRC ARRESTNEEDS RIGHT RADIAL A-LINE  . COLONOSCOPY WITH PROPOFOL N/A 01/21/2020   Procedure: COLONOSCOPY WITH PROPOFOL;  Surgeon: GJackquline Denmark MD;  [facility];  Service:  Endoscopy;  Laterality: N/A;  . ENTEROSCOPY N/A 01/24/2020   Procedure: ENTEROSCOPY;  Surgeon: DDoran Stabler MD;  [facility];  Service: Gastroenterology;  Laterality: N/A;  . ESOPHAGOGASTRODUODENOSCOPY (EGD) WITH PROPOFOL N/A 01/21/2020   Procedure: ESOPHAGOGASTRODUODENOSCOPY (EGD) WITH PROPOFOL;  Surgeon: GJackquline Denmark MD;  [facility];  Service: Endoscopy;  Laterality: N/A;  . GIVENS CAPSULE STUDY N/A 01/22/2020   Procedure: GIVENS CAPSULE STUDY;  Surgeon: DDoran Stabler MD;  [place]  Service: Gastroenterology;  Laterality: N/A;  . KNEE ARTHROSCOPY W/ ACL RECONSTRUCTION Right    90's; "2 ATHROSCOPIC , 2 REBUILDS"  . LEFT HEART CATHETERIZATION WITH CORONARY ANGIOGRAM N/A 09/20/2014   Procedure: LEFT HEART CATHETERIZATION WITH CORONARY ANGIOGRAM;  Surgeon: DLeonie Man MD;  [facility];  Proximal/ostial LAD 20%, OM2 99% -->> aspiration thrombectomy and DES PCI:   . PERCUTANEOUS CORONARY STENT INTERVENTION (PCI-S)  09/20/2014   Aspiration thrombectomy and DES PCI- OM 2(m-dCx): Promus Premier DES 3.0 mm x 16 mm (3.5 mm)  .  RIGHT/LEFT HEART CATH AND CORONARY ANGIOGRAPHY  09/2015   Dr. Shelva Majestic: Widely patent OM 2 stent.  Mild disease in RCA and LAD.Relatively normal right heart cath pressures.  Normal cardiac output.  . TEE WITHOUT CARDIOVERSION N/A 10/09/2015   Procedure: TRANSESOPHAGEAL ECHOCARDIOGRAM (TEE);  Surgeon: Gaye Pollack, MD;  Location: [place];  Service: Open Heart Surgery;  Laterality: N/A;  . TOTAL KNEE ARTHROPLASTY Right 12/09/2017   Procedure: RIGHT TOTAL KNEE ARTHROPLASTY REMOVAL OF HARDWARE RIGHT KNEE;  Surgeon: Dorna Leitz, MD;  Location: [facility];  Service: Orthopedics;  Laterality: Right;  . TRANSTHORACIC ECHOCARDIOGRAM  02/2016   Post AVR: mechanical: AVR without obstuction. LVEF improved to 65-70%.  . TRANSTHORACIC ECHOCARDIOGRAM  09/2014, 10/2014   Probable bicuspid aortic valve: a. mod-sev by 2D ECHO 09/2014. AVA  0.9cm^2; b) 10/2014 Echo:. Severe AS Mn-Pk Gradient 41-71 mmHg, AVA ~0.79 cm2, EF 60-65%  . TRANSTHORACIC ECHOCARDIOGRAM  12/2019    Normal WM. Gr 2 DD. Mild LA dilation. 23 mm St. Jude Mechanical AoV well seated.  Trivial AI.     Immunization History  Administered Date(s) Administered  . PFIZER SARS-COV-2 Vaccination 11/18/2019, 12/11/2019  . Pneumococcal Polysaccharide-23 03/05/2020  . Tdap 04/05/2014  . Typhoid Inactivated 04/05/2014  . Yellow Fever 04/05/2014    MEDICATIONS/ALLERGIES   Current Meds  Medication Sig  . acetaminophen (TYLENOL) 325 MG tablet Take 2 tablets (650 mg total) by mouth every 6 (six) hours as needed for mild pain.  . ferrous sulfate 325 (65 FE) MG tablet Take 325 mg by mouth 2 (two) times daily with a meal.  . metFORMIN (GLUCOPHAGE) 1000 MG tablet Take 1,000 mg by mouth 2 (two) times daily with a meal.  . warfarin (COUMADIN) 10 MG tablet Take 1 tablet daily except take 1 1/2 on Mondays or Take as directed by anticoagulation clinic  . [DISCONTINUED] carvedilol (COREG) 3.125 MG tablet Take 1 tablet (3.125 mg total) by mouth 2 (two) times daily.  . [DISCONTINUED] gemfibrozil (LOPID) 600 MG tablet Take 600 mg by mouth in the morning and at bedtime.  . [DISCONTINUED] rosuvastatin (CRESTOR) 20 MG tablet Take 20 mg by mouth at bedtime.    No Known Allergies  SOCIAL HISTORY/FAMILY HISTORY   Reviewed in Epic:  Pertinent findings:  Social History   Tobacco Use  . Smoking status: Never Smoker  . Smokeless tobacco: Former Systems developer    Types: Secondary school teacher  . Vaping Use: Never used  Substance Use Topics  . Alcohol use: Yes    Alcohol/week: 0.0 standard drinks    Comment: socially  . Drug use: No   Social History   Social History Narrative  . Not on file    OBJCTIVE -PE, EKG, labs   Wt Readings from Last 3 Encounters:  08/27/20 187 lb (84.8 kg)  07/11/20 182 lb (82.6 kg)  05/29/20 180 lb (81.6 kg)    Physical Exam: BP 122/74   Pulse 78   Ht 5'  7" (1.702 m)   Wt 187 lb (84.8 kg)   SpO2 98%   BMI 29.29 kg/m  Physical Exam Vitals reviewed.  Constitutional:      General: He is not in acute distress.    Appearance: Normal appearance. He is not ill-appearing or toxic-appearing.     Comments: Borderline obese.  Otherwise well-groomed.  Healthy-appearing.  HENT:     Head: Normocephalic and atraumatic.  Neck:     Vascular: No carotid bruit (Radiated aortic murmur).  Cardiovascular:  Rate and Rhythm: Normal rate and regular rhythm. Occasional extrasystoles are present.    Chest Wall: PMI is not displaced.     Pulses: Intact distal pulses.     Heart sounds: Murmur (1-2/6 SEM RUSB) heard.  No friction rub. No gallop. No S4 sounds.      Comments: Normal S1 with metallic S2 Musculoskeletal:     Cervical back: Normal range of motion.  Neurological:     Mental Status: He is alert.     Adult ECG Report Not checked  Recent Labs:    01/12/2020: TC 120, TG 272, HDL 31, LDL 47.  A1c 5.9.  04-02-20: Cr 0.92, K+ 4.3.  05/13/2020: Hgb 13.1.  PLT 298.  Lab Results  Component Value Date   CREATININE 0.83 08/28/2020   BUN 11 08/28/2020   NA 136 08/28/2020   K 4.8 08/28/2020   CL 98 08/28/2020   CO2 24 08/28/2020   Lab Results  Component Value Date   TSH 3.920 08/28/2020    ASSESSMENT/PLAN    Problem List Items Addressed This Visit    CAD S/P p DES PCI to the Circumflex-OM 2 - Primary (Chronic)    DES PCI LCx-OM2 insetting anterolateral STEMI.  This is back in 2016.  No longer on antiplatelet agents because of recent bleed while on aspirin plus warfarin.  Plan: Continue low-dose carvedilol along with rosuvastatin.      Relevant Medications   carvedilol (COREG) 3.125 MG tablet   Other Relevant Orders   Lipid panel (Completed)   Comprehensive Metabolic Panel (CMET) (Completed)   CBC w/Diff/Platelet (Completed)   HgB A1c (Completed)   TSH (Completed)   Hx of prosthetic aortic valve replacement -mechanical; along with  aortic root (Bental) (Chronic)    Recent echo showed normally functioning valve.  No significant stenosis no AI.  Remains on warfarin.  Has had GI bleeding. ->  Recheck CBC.      Relevant Orders   Lipid panel (Completed)   Comprehensive Metabolic Panel (CMET) (Completed)   CBC w/Diff/Platelet (Completed)   HgB A1c (Completed)   TSH (Completed)   Hyperlipidemia associated with type 2 diabetes mellitus (HCC) (Chronic)    On combination of rosuvastatin plus now gemfibrozil.  Has not had lipids checked since this change.  Need to be evaluated.  Last LDL was well controlled, TG high.  Plan: Check lipid panel and CMP. Continue rosuvastatin plus gemfibrozil. Currently only on Metformin for diabetes-recheck A1c.      Relevant Orders   Lipid panel (Completed)   Comprehensive Metabolic Panel (CMET) (Completed)   CBC w/Diff/Platelet (Completed)   HgB A1c (Completed)   TSH (Completed)   Hypertriglyceridemia (Chronic)    We will start fenofibrate and gemfibrozil.  TG still 272 (but down from original 900) .--We will recheck lipid panel.  Probably need to consider Vascepa or at a minimum fish oil...      Relevant Medications   carvedilol (COREG) 3.125 MG tablet   Other Relevant Orders   Lipid panel (Completed)   Comprehensive Metabolic Panel (CMET) (Completed)   CBC w/Diff/Platelet (Completed)   HgB A1c (Completed)   TSH (Completed)   Long term (current) use of anticoagulants [Z79.01] (Chronic)    Mechanical aortic valve.  Is on maintenance warfarin.  Can be held for procedures, for urgent procedures would simply hold warfarin, but reschedule procedures.  Best to bridge with Lovenox.         COVID-19 Education: The signs and symptoms of COVID-19 were discussed with  the patient and how to seek care for testing (follow up with PCP or arrange E-visit).   The importance of social distancing and COVID-19 vaccination was discussed today. The patient is practicing social distancing  & Masking.   I spent a total of 33 minutes with the patient spent in direct patient consultation.  Additional time spent with chart review  / charting (studies, outside notes, etc): 8 Total Time: 41 min   Current medicines are reviewed at length with the patient today.  (+/- concerns) n/a  This visit occurred during the SARS-CoV-2 public health emergency.  Safety protocols were in place, including screening questions prior to the visit, additional usage of staff PPE, and extensive cleaning of exam room while observing appropriate contact time as indicated for disinfecting solutions.  Notice: This dictation was prepared with Dragon dictation along with smaller phrase technology. Any transcriptional errors that result from this process are unintentional and may not be corrected upon review.  Patient Instructions / Medication Changes & Studies & Tests Ordered   Patient Instructions  Medication Instructions:  Start Fish Oil 2 to 3 grams daily Continue all other medications *If you need a refill on your cardiac medications before your next appointment, please call your pharmacy*   Lab Work: Cmet,lipid panel,cbc,a1c,tsh Have done this week Lab order enclosed Have same lab done next year about 1 week before appointment with Dr.Celene Pippins   Testing/Procedures: None ordered   Follow-Up: At [facility], you and your health needs are our priority.  As part of our continuing mission to provide you with exceptional heart care, we have created designated Provider Care Teams.  These Care Teams include your primary Cardiologist (physician) and Advanced Practice Providers (APPs -  Physician Assistants and Nurse Practitioners) who all work together to provide you with the care you need, when you need it.  We recommend signing up for the patient portal called "MyChart".  Sign up information is provided on this After Visit Summary.  MyChart is used to connect with patients for Virtual Visits  (Telemedicine).  Patients are able to view lab/test results, encounter notes, upcoming appointments, etc.  Non-urgent messages can be sent to your provider as well.   To learn more about what you can do with MyChart, go to [email or web address].    Your next appointment:  1 year   The format for your next appointment: Office     Provider:  Dr.Edvin Albus    Studies Ordered:   Orders Placed This Encounter  Procedures  . Lipid panel  . Comprehensive Metabolic Panel (CMET)  . CBC w/Diff/Platelet  . HgB A1c  . TSH     Glenetta Hew, M.D., M.S. Interventional Cardiologist   Pager # [phone] Phone # [phone] [address] [place], [place] [address]   Thank you for choosing Heart[facility] at [facility]!!

## 2020-08-27 NOTE — Patient Instructions (Signed)
Medication Instructions:  Start Fish Oil 2 to 3 grams daily Continue all other medications *If you need a refill on your cardiac medications before your next appointment, please call your pharmacy*   Lab Work: Cmet,lipid panel,cbc,a1c,tsh Have done this week Lab order enclosed Have same lab done next year about 1 week before appointment with Dr.Harding   Testing/Procedures: None ordered   Follow-Up: At [facility], you and your health needs are our priority.  As part of our continuing mission to provide you with exceptional heart care, we have created designated Provider Care Teams.  These Care Teams include your primary Cardiologist (physician) and Advanced Practice Providers (APPs -  Physician Assistants and Nurse Practitioners) who all work together to provide you with the care you need, when you need it.  We recommend signing up for the patient portal called "MyChart".  Sign up information is provided on this After Visit Summary.  MyChart is used to connect with patients for Virtual Visits (Telemedicine).  Patients are able to view lab/test results, encounter notes, upcoming appointments, etc.  Non-urgent messages can be sent to your provider as well.   To learn more about what you can do with MyChart, go to [email or web address].    Your next appointment:  1 year   The format for your next appointment: Office     Provider:  Dr.Harding

## 2020-08-29 LAB — LIPID PANEL
Chol/HDL Ratio: 4.3 ratio (ref 0.0–5.0)
Cholesterol, Total: 137 mg/dL (ref 100–199)
HDL: 32 mg/dL — ABNORMAL LOW (ref >39)
LDL Chol Calc (NIH): 49 mg/dL (ref 0–99)
Triglycerides: 371 mg/dL — ABNORMAL HIGH (ref 0–149)
VLDL Cholesterol Cal: 56 mg/dL — ABNORMAL HIGH (ref 5–40)

## 2020-08-29 LAB — CBC WITH DIFFERENTIAL/PLATELET
Basophils Absolute: 0 10*3/uL (ref 0.0–0.2)
Basos: 1 %
EOS (ABSOLUTE): 0.1 10*3/uL (ref 0.0–0.4)
Eos: 3 %
Hematocrit: 40.8 % (ref 37.5–51.0)
Hemoglobin: 14.5 g/dL (ref 13.0–17.7)
Immature Grans (Abs): 0 10*3/uL (ref 0.0–0.1)
Immature Granulocytes: 1 %
Lymphocytes Absolute: 1 10*3/uL (ref 0.7–3.1)
Lymphs: 24 %
MCH: 30.2 pg (ref 26.6–33.0)
MCHC: 35.5 g/dL (ref 31.5–35.7)
MCV: 85 fL (ref 79–97)
Monocytes Absolute: 0.4 10*3/uL (ref 0.1–0.9)
Monocytes: 10 %
Neutrophils Absolute: 2.5 10*3/uL (ref 1.4–7.0)
Neutrophils: 61 %
Platelets: 285 10*3/uL (ref 150–450)
RBC: 4.8 x10E6/uL (ref 4.14–5.80)
RDW: 13 % (ref 11.6–15.4)
WBC: 4.1 10*3/uL (ref 3.4–10.8)

## 2020-08-29 LAB — COMPREHENSIVE METABOLIC PANEL
ALT: 19 IU/L (ref 0–44)
AST: 20 IU/L (ref 0–40)
Albumin/Globulin Ratio: 2.3 — ABNORMAL HIGH (ref 1.2–2.2)
Albumin: 5 g/dL — ABNORMAL HIGH (ref 3.8–4.9)
Alkaline Phosphatase: 42 IU/L — ABNORMAL LOW (ref 44–121)
BUN/Creatinine Ratio: 13 (ref 9–20)
BUN: 11 mg/dL (ref 6–24)
Bilirubin Total: 0.7 mg/dL (ref 0.0–1.2)
CO2: 24 mmol/L (ref 20–29)
Calcium: 10 mg/dL (ref 8.7–10.2)
Chloride: 98 mmol/L (ref 96–106)
Creatinine, Ser: 0.83 mg/dL (ref 0.76–1.27)
GFR calc Af Amer: 112 mL/min/{1.73_m2} (ref >59)
GFR calc non Af Amer: 97 mL/min/{1.73_m2} (ref >59)
Globulin, Total: 2.2 g/dL (ref 1.5–4.5)
Glucose: 198 mg/dL — ABNORMAL HIGH (ref 65–99)
Potassium: 4.8 mmol/L (ref 3.5–5.2)
Sodium: 136 mmol/L (ref 134–144)
Total Protein: 7.2 g/dL (ref 6.0–8.5)

## 2020-08-29 LAB — HEMOGLOBIN A1C
Est. average glucose Bld gHb Est-mCnc: 192 mg/dL
Hgb A1c MFr Bld: 8.3 % — ABNORMAL HIGH (ref 4.8–5.6)

## 2020-08-29 LAB — TSH: TSH: 3.92 u[IU]/mL (ref 0.450–4.500)

## 2020-09-02 ENCOUNTER — Other Ambulatory Visit: Payer: Self-pay | Admitting: Family Medicine

## 2020-09-02 MED ORDER — ROSUVASTATIN CALCIUM 20 MG PO TABS
20.0000 mg | ORAL_TABLET | Freq: Every day | ORAL | 3 refills | Status: DC
Start: 2020-09-02 — End: 2021-09-10

## 2020-09-02 MED ORDER — GEMFIBROZIL 600 MG PO TABS
600.0000 mg | ORAL_TABLET | Freq: Two times a day (BID) | ORAL | 3 refills | Status: DC
Start: 2020-09-02 — End: 2021-09-10

## 2020-09-02 NOTE — Telephone Encounter (Signed)
Refills sent

## 2020-09-02 NOTE — Telephone Encounter (Signed)
Pt is calling in stating that he is out of Rx gemfibrozil (LOPID) 600 MG and rosuvastatin (CRESTOR) 20 MG   Pharm:  [facility] in [place].  Pt would like to have a call back when it has been sent to the provider.  Pt stated that the pharmacy has been filling it from the old refills of the old provider and now they are requesting a new prescription.

## 2020-09-12 ENCOUNTER — Encounter: Payer: Self-pay | Admitting: Cardiology

## 2020-09-12 NOTE — Assessment & Plan Note (Signed)
On combination of rosuvastatin plus now gemfibrozil.  Has not had lipids checked since this change.  Need to be evaluated.  Last LDL was well controlled, TG high.  Plan: Check lipid panel and CMP. Continue rosuvastatin plus gemfibrozil. Currently only on Metformin for diabetes-recheck A1c.

## 2020-09-12 NOTE — Assessment & Plan Note (Signed)
Mechanical aortic valve.  Is on maintenance warfarin.  Can be held for procedures, for urgent procedures would simply hold warfarin, but reschedule procedures.  Best to bridge with Lovenox.

## 2020-09-12 NOTE — Assessment & Plan Note (Signed)
DES PCI LCx-OM2 insetting anterolateral STEMI.  This is back in 2016.  No longer on antiplatelet agents because of recent bleed while on aspirin plus warfarin.  Plan: Continue low-dose carvedilol along with rosuvastatin.

## 2020-09-12 NOTE — Assessment & Plan Note (Signed)
Recent echo showed normally functioning valve.  No significant stenosis no AI.  Remains on warfarin.  Has had GI bleeding. ->  Recheck CBC.

## 2020-09-12 NOTE — Assessment & Plan Note (Signed)
We will start fenofibrate and gemfibrozil.  TG still 272 (but down from original 900) .--We will recheck lipid panel.  Probably need to consider Vascepa or at a minimum fish oil.Marland KitchenMarland Kitchen

## 2020-09-16 ENCOUNTER — Other Ambulatory Visit: Payer: Self-pay

## 2020-09-16 DIAGNOSIS — Z79899 Other long term (current) drug therapy: Secondary | ICD-10-CM

## 2020-09-16 DIAGNOSIS — E781 Pure hyperglyceridemia: Secondary | ICD-10-CM

## 2020-09-16 DIAGNOSIS — E1169 Type 2 diabetes mellitus with other specified complication: Secondary | ICD-10-CM

## 2020-09-23 ENCOUNTER — Ambulatory Visit (INDEPENDENT_AMBULATORY_CARE_PROVIDER_SITE_OTHER): Payer: Self-pay | Admitting: General Practice

## 2020-09-23 DIAGNOSIS — Q230 Congenital stenosis of aortic valve: Secondary | ICD-10-CM

## 2020-09-23 DIAGNOSIS — Q231 Congenital insufficiency of aortic valve: Secondary | ICD-10-CM

## 2020-09-23 DIAGNOSIS — Z7901 Long term (current) use of anticoagulants: Secondary | ICD-10-CM

## 2020-09-23 NOTE — Progress Notes (Signed)
Medical screening examination/treatment/procedure(s) were performed by non-physician practitioner and as supervising physician I was immediately available for consultation/collaboration. I agree with above. Lenae Wherley, MD   

## 2020-09-23 NOTE — Patient Instructions (Signed)
Pre visit review using our clinic review tool, if applicable. No additional management support is needed unless otherwise documented below in the visit note.  Continue to take 1 tablet daily except 1 1/2 tablets on Mondays.  Re-check in 1 week.  Patient will be checking with MD INR. LMOVM.  Fax received on 1/11.

## 2020-10-07 LAB — POCT INR: INR: 2.6 (ref 2.0–3.0)

## 2020-10-14 ENCOUNTER — Ambulatory Visit (INDEPENDENT_AMBULATORY_CARE_PROVIDER_SITE_OTHER): Payer: Self-pay | Admitting: General Practice

## 2020-10-14 DIAGNOSIS — Q231 Congenital insufficiency of aortic valve: Secondary | ICD-10-CM

## 2020-10-14 DIAGNOSIS — Q230 Congenital stenosis of aortic valve: Secondary | ICD-10-CM

## 2020-10-14 DIAGNOSIS — Z7901 Long term (current) use of anticoagulants: Secondary | ICD-10-CM

## 2020-10-14 NOTE — Progress Notes (Signed)
Medical screening examination/treatment/procedure(s) were performed by non-physician practitioner and as supervising physician I was immediately available for consultation/collaboration. I agree with above. Jayde Daffin, MD   

## 2020-10-14 NOTE — Patient Instructions (Signed)
Pre visit review using our clinic review tool, if applicable. No additional management support is needed unless otherwise documented below in the visit note.  Continue to take 1 tablet daily except 1 1/2 tablets on Mondays.  Re-check in 1 week.  Patient will be checking with MD INR. LMOVM.  Fax received on 10/14/20.  *INR's for January 2022 1/4 - 2.9 1/15 - 2.9 1/18 - 2.8 MD INR sends fax every 2 weeks.

## 2020-11-06 ENCOUNTER — Ambulatory Visit (INDEPENDENT_AMBULATORY_CARE_PROVIDER_SITE_OTHER): Payer: Self-pay | Admitting: General Practice

## 2020-11-06 DIAGNOSIS — Q231 Congenital insufficiency of aortic valve: Secondary | ICD-10-CM

## 2020-11-06 DIAGNOSIS — Q230 Congenital stenosis of aortic valve: Secondary | ICD-10-CM

## 2020-11-06 DIAGNOSIS — Z7901 Long term (current) use of anticoagulants: Secondary | ICD-10-CM

## 2020-11-06 LAB — POCT INR: INR: 2.1 (ref 2.0–3.0)

## 2020-11-06 NOTE — Patient Instructions (Addendum)
Pre visit review using our clinic review tool, if applicable. No additional management support is needed unless otherwise documented below in the visit note.  Please take extra 1/2 tablet today and tomorrow and then continue to take 1 tablet daily except 1 1/2 tablets on Mondays.  Re-check in 3 weeks.  Patient will be checking with MD INR. LMOVM with dosing instructions.  Fax received on 2/24.  2-15 - 2.1 2/13 - 3.0 2/8 - 3.0 2/1 - 2.4 MD INR sends fax every 2 weeks.

## 2020-11-06 NOTE — Progress Notes (Signed)
Medical screening examination/treatment/procedure(s) were performed by non-physician practitioner and as supervising physician I was immediately available for consultation/collaboration. I agree with above. Vicky Mccanless, MD   

## 2020-11-11 ENCOUNTER — Ambulatory Visit: Payer: Self-pay | Admitting: General Practice

## 2020-11-11 ENCOUNTER — Ambulatory Visit (INDEPENDENT_AMBULATORY_CARE_PROVIDER_SITE_OTHER): Payer: Self-pay | Admitting: General Practice

## 2020-11-11 DIAGNOSIS — Z7901 Long term (current) use of anticoagulants: Secondary | ICD-10-CM

## 2020-11-11 DIAGNOSIS — Q230 Congenital stenosis of aortic valve: Secondary | ICD-10-CM

## 2020-11-11 DIAGNOSIS — Q231 Congenital insufficiency of aortic valve: Secondary | ICD-10-CM

## 2020-11-11 LAB — POCT INR
INR: 1.4 — AB (ref 2.0–3.0)
INR: 1.4 — AB (ref 2.0–3.0)
INR: 1.7 — AB (ref 2.0–3.0)

## 2020-11-11 NOTE — Patient Instructions (Signed)
Pre visit review using our clinic review tool, if applicable. No additional management support is needed unless otherwise documented below in the visit note.  Please take extra 1/2 tablet for 3 days and then continue to take 1 tablet daily except 1 1/2 tablets on Mondays.  Re-check in 1 week. Patient will be checking with MD INR. LMOVM with dosing instructions.  Fax received on 3/1.

## 2020-11-11 NOTE — Patient Instructions (Signed)
Pre visit review using our clinic review tool, if applicable. No additional management support is needed unless otherwise documented below in the visit note.  INR is 1.4 today.  Please take extra 1/2 tablet for 4 days and then continue to take 1 tablet daily except 1 1/2 tablets on Mondays.  Re-check in 1 week.  Patient has been given dosing instructions.  RN reiterated the importance of reporting INR on a weekly basis as MD INR is only reporting every 2 weeks.  Also reiterated the importance of checking with Bailey Mech, RN before starting new supplements or any  type of product with herbal extracts etc.  Patient verbalized understanding.

## 2020-11-11 NOTE — Progress Notes (Signed)
Medical screening examination/treatment/procedure(s) were performed by non-physician practitioner and as supervising physician I was immediately available for consultation/collaboration. I agree with above. Kristeen Lantz, MD   

## 2020-11-11 NOTE — Progress Notes (Signed)
Medical screening examination/treatment/procedure(s) were performed by non-physician practitioner and as supervising physician I was immediately available for consultation/collaboration. I agree with above. Berlie Persky, MD   

## 2020-11-18 ENCOUNTER — Ambulatory Visit (INDEPENDENT_AMBULATORY_CARE_PROVIDER_SITE_OTHER): Payer: Self-pay | Admitting: General Practice

## 2020-11-18 DIAGNOSIS — Q230 Congenital stenosis of aortic valve: Secondary | ICD-10-CM

## 2020-11-18 DIAGNOSIS — Z7901 Long term (current) use of anticoagulants: Secondary | ICD-10-CM

## 2020-11-18 DIAGNOSIS — Q231 Congenital insufficiency of aortic valve: Secondary | ICD-10-CM

## 2020-11-18 LAB — POCT INR: INR: 3 (ref 2.0–3.0)

## 2020-11-18 NOTE — Progress Notes (Signed)
Medical screening examination/treatment/procedure(s) were performed by non-physician practitioner and as supervising physician I was immediately available for consultation/collaboration. I agree with above. James John, MD   

## 2020-11-18 NOTE — Patient Instructions (Signed)
Pre visit review using our clinic review tool, if applicable. No additional management support is needed unless otherwise documented below in the visit note.  Continue to take 1 tablet daily except 1 1/2 tablets on Mondays.  Re-check in 1 week.  Patient has been given dosing instructions.  RN reiterated the importance of reporting INR on a weekly basis as MD INR is only reporting every 2 weeks.  Also reiterated the importance of checking with Bailey Mech, RN before starting new supplements or any  type of product with herbal extracts etc.  Patient verbalized understanding.

## 2020-11-25 ENCOUNTER — Ambulatory Visit (INDEPENDENT_AMBULATORY_CARE_PROVIDER_SITE_OTHER): Payer: Self-pay | Admitting: General Practice

## 2020-11-25 DIAGNOSIS — Z7901 Long term (current) use of anticoagulants: Secondary | ICD-10-CM

## 2020-11-25 DIAGNOSIS — Q230 Congenital stenosis of aortic valve: Secondary | ICD-10-CM

## 2020-11-25 DIAGNOSIS — Q231 Congenital insufficiency of aortic valve: Secondary | ICD-10-CM

## 2020-11-25 LAB — POCT INR: INR: 3.5 — AB (ref 2.0–3.0)

## 2020-11-25 NOTE — Progress Notes (Signed)
Medical screening examination/treatment/procedure(s) were performed by non-physician practitioner and as supervising physician I was immediately available for consultation/collaboration. I agree with above. Syeda Prickett, MD   

## 2020-11-25 NOTE — Patient Instructions (Signed)
Pre visit review using our clinic review tool, if applicable. No additional management support is needed unless otherwise documented below in the visit note.  Continue to take 1 tablet daily except 1 1/2 tablets on Mondays.  Re-check in 1 week.  Patient has been given dosing instructions.  RN reiterated the importance of reporting INR on a weekly basis as MD INR is only reporting every 2 weeks.  Also reiterated the importance of checking with Cindy Ramon Zanders, RN before starting new supplements or any  type of product with herbal extracts etc.  Patient verbalized understanding.   

## 2020-11-29 ENCOUNTER — Other Ambulatory Visit: Payer: Self-pay | Admitting: Family Medicine

## 2020-11-29 DIAGNOSIS — Z7901 Long term (current) use of anticoagulants: Secondary | ICD-10-CM

## 2020-12-11 ENCOUNTER — Ambulatory Visit (INDEPENDENT_AMBULATORY_CARE_PROVIDER_SITE_OTHER): Payer: Self-pay | Admitting: General Practice

## 2020-12-11 DIAGNOSIS — Q230 Congenital stenosis of aortic valve: Secondary | ICD-10-CM

## 2020-12-11 DIAGNOSIS — Q231 Congenital insufficiency of aortic valve: Secondary | ICD-10-CM

## 2020-12-11 DIAGNOSIS — Z7901 Long term (current) use of anticoagulants: Secondary | ICD-10-CM

## 2020-12-11 LAB — POCT INR: INR: 1.7 — AB (ref 2.0–3.0)

## 2020-12-11 NOTE — Progress Notes (Signed)
Medical screening examination/treatment/procedure(s) were performed by non-physician practitioner and as supervising physician I was immediately available for consultation/collaboration. I agree with above. Jannifer Fischler, MD   

## 2020-12-11 NOTE — Patient Instructions (Signed)
Pre visit review using our clinic review tool, if applicable. No additional management support is needed unless otherwise documented below in the visit note.  Take 1 1/2 tablets today and tomorrow and then continue to take 1 tablet daily except 1 1/2 tablets on Mondays.  Re-check in 1 week.  Patient has been given dosing instructions.  RN reiterated the importance of reporting INR on a weekly basis as MD INR is only reporting every 2 weeks.  Also reiterated the importance of checking with Bailey Mech, RN before starting new supplements or any  type of product with herbal extracts etc.  Patient verbalized understanding.

## 2020-12-23 ENCOUNTER — Ambulatory Visit (INDEPENDENT_AMBULATORY_CARE_PROVIDER_SITE_OTHER): Payer: Self-pay | Admitting: General Practice

## 2020-12-23 DIAGNOSIS — Z7901 Long term (current) use of anticoagulants: Secondary | ICD-10-CM

## 2020-12-23 DIAGNOSIS — Q231 Congenital insufficiency of aortic valve: Secondary | ICD-10-CM

## 2020-12-23 DIAGNOSIS — Q230 Congenital stenosis of aortic valve: Secondary | ICD-10-CM

## 2020-12-23 LAB — POCT INR: INR: 3.9 — AB (ref 2.0–3.0)

## 2020-12-23 NOTE — Patient Instructions (Signed)
Pre visit review using our clinic review tool, if applicable. No additional management support is needed unless otherwise documented below in the visit note.  Skip dosage today and then continue to take 1 tablet daily except 1 1/2 tablets on Mondays.  Re-check in 1 week.  Patient has been given dosing instructions.  RN reiterated the importance of reporting INR on a weekly basis as MD INR is only reporting every 2 weeks.  Also reiterated the importance of checking with Bailey Mech, RN before starting new supplements or any  type of product with herbal extracts etc.  Patient verbalized understanding.

## 2020-12-23 NOTE — Progress Notes (Signed)
Medical screening examination/treatment/procedure(s) were performed by non-physician practitioner and as supervising physician I was immediately available for consultation/collaboration. I agree with above. Kordelia Severin, MD   

## 2021-01-13 ENCOUNTER — Ambulatory Visit (INDEPENDENT_AMBULATORY_CARE_PROVIDER_SITE_OTHER): Payer: Self-pay | Admitting: General Practice

## 2021-01-13 DIAGNOSIS — Q230 Congenital stenosis of aortic valve: Secondary | ICD-10-CM

## 2021-01-13 DIAGNOSIS — Q231 Congenital insufficiency of aortic valve: Secondary | ICD-10-CM

## 2021-01-13 DIAGNOSIS — Z7901 Long term (current) use of anticoagulants: Secondary | ICD-10-CM

## 2021-01-13 LAB — POCT INR: INR: 3.2 — AB (ref 2.0–3.0)

## 2021-01-13 NOTE — Progress Notes (Signed)
Medical screening examination/treatment/procedure(s) were performed by non-physician practitioner and as supervising physician I was immediately available for consultation/collaboration. I agree with above. Donold Marotto, MD   

## 2021-01-13 NOTE — Patient Instructions (Addendum)
Pre visit review using our clinic review tool, if applicable. No additional management support is needed unless otherwise documented below in the visit note.  Continue to take 1 tablet daily except 1 1/2 tablets on Mondays.  Re-check in 1 week.  Patient has been given dosing instructions.  RN reiterated the importance of reporting INR on a weekly basis as MD INR is only reporting every 2 weeks.  Also reiterated the importance of checking with Bailey Mech, RN before starting new supplements or any  type of product with herbal extracts etc.  Patient verbalized understanding.

## 2021-01-20 ENCOUNTER — Encounter: Payer: Self-pay | Admitting: Family Medicine

## 2021-01-20 DIAGNOSIS — Z7901 Long term (current) use of anticoagulants: Secondary | ICD-10-CM

## 2021-01-21 ENCOUNTER — Ambulatory Visit (INDEPENDENT_AMBULATORY_CARE_PROVIDER_SITE_OTHER): Payer: Self-pay | Admitting: General Practice

## 2021-01-21 DIAGNOSIS — Z7901 Long term (current) use of anticoagulants: Secondary | ICD-10-CM

## 2021-01-21 LAB — POCT INR: INR: 3.3 — AB (ref 2.0–3.0)

## 2021-01-21 NOTE — Patient Instructions (Signed)
Pre visit review using our clinic review tool, if applicable. No additional management support is needed unless otherwise documented below in the visit note.  Continue to take 1 tablet daily except 1 1/2 tablets on Mondays.  Re-check in 1 week.  Patient has been given dosing instructions.  RN reiterated the importance of reporting INR on a weekly basis as MD INR is only reporting every 2 weeks.  Also reiterated the importance of checking with Bailey Mech, RN before starting new supplements or any  type of product with herbal extracts etc.  Patient verbalized understanding.

## 2021-01-21 NOTE — Progress Notes (Signed)
I have reviewed and agree with note, evaluation, plan.   Yanett Conkright, MD  

## 2021-01-27 ENCOUNTER — Encounter: Payer: Self-pay | Admitting: Family Medicine

## 2021-01-28 ENCOUNTER — Ambulatory Visit (INDEPENDENT_AMBULATORY_CARE_PROVIDER_SITE_OTHER): Payer: Self-pay | Admitting: General Practice

## 2021-01-28 DIAGNOSIS — Q231 Congenital insufficiency of aortic valve: Secondary | ICD-10-CM

## 2021-01-28 DIAGNOSIS — Q230 Congenital stenosis of aortic valve: Secondary | ICD-10-CM

## 2021-01-28 DIAGNOSIS — Z7901 Long term (current) use of anticoagulants: Secondary | ICD-10-CM

## 2021-01-28 LAB — POCT INR: INR: 2.5 (ref 2.0–3.0)

## 2021-01-28 NOTE — Patient Instructions (Signed)
Pre visit review using our clinic review tool, if applicable. No additional management support is needed unless otherwise documented below in the visit note.  Continue to take 1 tablet daily except 1 1/2 tablets on Mondays.  Re-check in 1 week.  Patient has been given dosing instructions.  RN reiterated the importance of reporting INR on a weekly basis as MD INR is only reporting every 2 weeks.  Also reiterated the importance of checking with Cindy Darly Massi, RN before starting new supplements or any  type of product with herbal extracts etc.  Patient verbalized understanding.   

## 2021-02-04 LAB — POCT INR: INR: 2.1 (ref 2.0–3.0)

## 2021-02-10 ENCOUNTER — Ambulatory Visit (INDEPENDENT_AMBULATORY_CARE_PROVIDER_SITE_OTHER): Payer: BC Managed Care – PPO | Admitting: Family Medicine

## 2021-02-10 ENCOUNTER — Encounter: Payer: Self-pay | Admitting: Family Medicine

## 2021-02-10 ENCOUNTER — Other Ambulatory Visit: Payer: Self-pay

## 2021-02-10 ENCOUNTER — Ambulatory Visit (INDEPENDENT_AMBULATORY_CARE_PROVIDER_SITE_OTHER): Payer: BC Managed Care – PPO | Admitting: General Practice

## 2021-02-10 VITALS — BP 130/80 | HR 72 | Temp 97.6°F | Wt 182.2 lb

## 2021-02-10 DIAGNOSIS — Z9861 Coronary angioplasty status: Secondary | ICD-10-CM

## 2021-02-10 DIAGNOSIS — E1165 Type 2 diabetes mellitus with hyperglycemia: Secondary | ICD-10-CM | POA: Diagnosis not present

## 2021-02-10 DIAGNOSIS — E785 Hyperlipidemia, unspecified: Secondary | ICD-10-CM

## 2021-02-10 DIAGNOSIS — Z7901 Long term (current) use of anticoagulants: Secondary | ICD-10-CM | POA: Diagnosis not present

## 2021-02-10 DIAGNOSIS — I2510 Atherosclerotic heart disease of native coronary artery without angina pectoris: Secondary | ICD-10-CM | POA: Diagnosis not present

## 2021-02-10 DIAGNOSIS — E1169 Type 2 diabetes mellitus with other specified complication: Secondary | ICD-10-CM

## 2021-02-10 LAB — POCT GLYCOSYLATED HEMOGLOBIN (HGB A1C): Hemoglobin A1C: 8.7 % — AB (ref 4.0–5.6)

## 2021-02-10 MED ORDER — EMPAGLIFLOZIN 10 MG PO TABS: 10.0000 mg | ORAL_TABLET | Freq: Every day | ORAL | 11 refills | Status: DC

## 2021-02-10 MED ORDER — METFORMIN HCL 1000 MG PO TABS: 1000.0000 mg | ORAL_TABLET | Freq: Two times a day (BID) | ORAL | 11 refills | Status: DC

## 2021-02-10 MED ORDER — CARVEDILOL 3.125 MG PO TABS: 3.1250 mg | ORAL_TABLET | Freq: Two times a day (BID) | ORAL | 11 refills | Status: DC

## 2021-02-10 NOTE — Progress Notes (Signed)
Medical screening examination/treatment/procedure(s) were performed by non-physician practitioner and as supervising physician I was immediately available for consultation/collaboration. I agree with above. Shloimy Michalski, MD   

## 2021-02-10 NOTE — Patient Instructions (Signed)
Pre visit review using our clinic review tool, if applicable. No additional management support is needed unless otherwise documented below in the visit note.  Per Dr. Caryl Never, Take 1 1/2 tablets today (5/25) and then continue to take 1 tablet daily except 1 1/2 tablets on Mondays.  Re-check in 1 week.  Patient has been given dosing instructions.  RN reiterated the importance of reporting INR on a weekly basis as MD INR is only reporting every 2 weeks.  Also reiterated the importance of checking with Bailey Mech, RN before starting new supplements or any  type of product with herbal extracts etc.  Patient verbalized understanding.

## 2021-02-10 NOTE — Patient Instructions (Signed)
Diabetes Mellitus and Nutrition, Adult When you have diabetes, or diabetes mellitus, it is very important to have healthy eating habits because your blood sugar (glucose) levels are greatly affected by what you eat and drink. Eating healthy foods in the right amounts, at about the same times every day, can help you:  Control your blood glucose.  Lower your risk of heart disease.  Improve your blood pressure.  Reach or maintain a healthy weight. What can affect my meal plan? Every person with diabetes is different, and each person has different needs for a meal plan. Your health care provider may recommend that you work with a dietitian to make a meal plan that is best for you. Your meal plan may vary depending on factors such as:  The calories you need.  The medicines you take.  Your weight.  Your blood glucose, blood pressure, and cholesterol levels.  Your activity level.  Other health conditions you have, such as heart or kidney disease. How do carbohydrates affect me? Carbohydrates, also called carbs, affect your blood glucose level more than any other type of food. Eating carbs naturally raises the amount of glucose in your blood. Carb counting is a method for keeping track of how many carbs you eat. Counting carbs is important to keep your blood glucose at a healthy level, especially if you use insulin or take certain oral diabetes medicines. It is important to know how many carbs you can safely have in each meal. This is different for every person. Your dietitian can help you calculate how many carbs you should have at each meal and for each snack. How does alcohol affect me? Alcohol can cause a sudden decrease in blood glucose (hypoglycemia), especially if you use insulin or take certain oral diabetes medicines. Hypoglycemia can be a life-threatening condition. Symptoms of hypoglycemia, such as sleepiness, dizziness, and confusion, are similar to symptoms of having too much  alcohol.  Do not drink alcohol if: ? Your health care provider tells you not to drink. ? You are pregnant, may be pregnant, or are planning to become pregnant.  If you drink alcohol: ? Do not drink on an empty stomach. ? Limit how much you use to:  0-1 drink a day for women.  0-2 drinks a day for men. ? Be aware of how much alcohol is in your drink. In the [place] one drink equals one 12 oz bottle of beer (355 mL), one 5 oz glass of wine (148 mL), or one 1 oz glass of hard liquor (44 mL). ? Keep yourself hydrated with water, diet soda, or unsweetened iced tea.  Keep in mind that regular soda, juice, and other mixers may contain a lot of sugar and must be counted as carbs. What are tips for following this plan? Reading food labels  Start by checking the serving size on the "Nutrition Facts" label of packaged foods and drinks. The amount of calories, carbs, fats, and other nutrients listed on the label is based on one serving of the item. Many items contain more than one serving per package.  Check the total grams (g) of carbs in one serving. You can calculate the number of servings of carbs in one serving by dividing the total carbs by 15. For example, if a food has 30 g of total carbs per serving, it would be equal to 2 servings of carbs.  Check the number of grams (g) of saturated fats and trans fats in one serving. Choose foods that have   a low amount or none of these fats.  Check the number of milligrams (mg) of salt (sodium) in one serving. Most people should limit total sodium intake to less than 2,300 mg per day.  Always check the nutrition information of foods labeled as "low-fat" or "nonfat." These foods may be higher in added sugar or refined carbs and should be avoided.  Talk to your dietitian to identify your daily goals for nutrients listed on the label. Shopping  Avoid buying canned, pre-made, or processed foods. These foods tend to be high in fat, sodium, and added  sugar.  Shop around the outside edge of the grocery store. This is where you will most often find fresh fruits and vegetables, bulk grains, fresh meats, and fresh dairy. Cooking  Use low-heat cooking methods, such as baking, instead of high-heat cooking methods like deep frying.  Cook using healthy oils, such as olive, canola, or sunflower oil.  Avoid cooking with butter, cream, or high-fat meats. Meal planning  Eat meals and snacks regularly, preferably at the same times every day. Avoid going long periods of time without eating.  Eat foods that are high in fiber, such as fresh fruits, vegetables, beans, and whole grains. Talk with your dietitian about how many servings of carbs you can eat at each meal.  Eat 4-6 oz (112-168 g) of lean protein each day, such as lean meat, chicken, fish, eggs, or tofu. One ounce (oz) of lean protein is equal to: ? 1 oz (28 g) of meat, chicken, or fish. ? 1 egg. ?  cup (62 g) of tofu.  Eat some foods each day that contain healthy fats, such as avocado, nuts, seeds, and fish.   What foods should I eat? Fruits Berries. Apples. Oranges. Peaches. Apricots. Plums. Grapes. Mango. Papaya. Pomegranate. Kiwi. Cherries. Vegetables Lettuce. Spinach. Leafy greens, including kale, chard, collard greens, and mustard greens. Beets. Cauliflower. Cabbage. Broccoli. Carrots. Green beans. Tomatoes. Peppers. Onions. Cucumbers. Brussels sprouts. Grains Whole grains, such as whole-wheat or whole-grain bread, crackers, tortillas, cereal, and pasta. Unsweetened oatmeal. Quinoa. Brown or wild rice. Meats and other proteins Seafood. Poultry without skin. Lean cuts of poultry and beef. Tofu. Nuts. Seeds. Dairy Low-fat or fat-free dairy products such as milk, yogurt, and cheese. The items listed above may not be a complete list of foods and beverages you can eat. Contact a dietitian for more information. What foods should I avoid? Fruits Fruits canned with  syrup. Vegetables Canned vegetables. Frozen vegetables with butter or cream sauce. Grains Refined white flour and flour products such as bread, pasta, snack foods, and cereals. Avoid all processed foods. Meats and other proteins Fatty cuts of meat. Poultry with skin. Breaded or fried meats. Processed meat. Avoid saturated fats. Dairy Full-fat yogurt, cheese, or milk. Beverages Sweetened drinks, such as soda or iced tea. The items listed above may not be a complete list of foods and beverages you should avoid. Contact a dietitian for more information. Questions to ask a health care provider  Do I need to meet with a diabetes educator?  Do I need to meet with a dietitian?  What number can I call if I have questions?  When are the best times to check my blood glucose? Where to find more information:  American Diabetes Association: diabetes.org  Academy of Nutrition and Dietetics: www.eatright.org  National Institute of Diabetes and Digestive and Kidney Diseases: www.niddk.nih.gov  Association of Diabetes Care and Education Specialists: www.diabeteseducator.org Summary  It is important to have healthy eating   habits because your blood sugar (glucose) levels are greatly affected by what you eat and drink.  A healthy meal plan will help you control your blood glucose and maintain a healthy lifestyle.  Your health care provider may recommend that you work with a dietitian to make a meal plan that is best for you.  Keep in mind that carbohydrates (carbs) and alcohol have immediate effects on your blood glucose levels. It is important to count carbs and to use alcohol carefully. This information is not intended to replace advice given to you by your health care provider. Make sure you discuss any questions you have with your health care provider. Document Revised: 08/07/2019 Document Reviewed: 08/07/2019 [employer] Patient Education  2021 [employer]  

## 2021-02-10 NOTE — Progress Notes (Signed)
Established Patient Office Visit  Subjective:  Patient ID: Dennis Bates, male    DOB: 07-27-1961  Age: 60 y.o. MRN: [id number]  CC:  Chief Complaint  Patient presents with  . Medication Refill    Metformin and carvedilol.     HPI [name] presents for medical follow-up.  Needs refills of metformin and carvedilol.  He has history of CAD, aortic stenosis secondary to bicuspid aortic valve, chronic Coumadin therapy, type 2 diabetes, dyslipidemia, osteoarthritis.  He is followed closely by Coumadin clinic.  Last A1c by labs last December was 8.3%.  He has not had any follow-up for diabetes since then.  Not monitoring blood sugars regularly.  On metformin 1000 milligrams twice daily.  Does not recall being on other diabetic medicines previously.  No polyuria or polydipsia.  No recent chest pains.  Renal function previously has been stable.  Past Medical History:  Diagnosis Date  . Aortic stenosis due to bicuspid aortic valve 09/2014   Severe aortic stenosis of bicuspid valve - s/pAVR with [place] ( 09/2015)  . Bell's palsy    LAST EPISODE 09-2015  . CAD S/P percutaneous coronary angioplasty 09/20/2014   a. inferolat STEMI ->> LHC-Angio: Proximal/ostial LAD 20%, OM2 99% -->> PCI: 59mm x 16 mm Promus Premier DES to the OM2.(~3.5 mm)  . Diabetes mellitus type 2, controlled, with complications (HCC)   . History of mechanical aortic valve replacement 10/09/2015   after 1 yr DAPT for Inferolateral STEMI. (Dr. Laneta Simmers)  . Hyperlipidemia   . Hypertension   . Hypertriglyceridemia   . 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 ST elevation myocardial infarction (STEMI) of inferior wall (HCC) 09/20/2014   99 % occluded Cx-OM2 Promus DES 3.0 mm x 16 mm (3.5 mm)    Past Surgical History:  Procedure Laterality Date  . BENTALL PROCEDURE N/A 10/09/2015   Procedure: BENTALL PROCEDURE (using a St Jude mechanical valve, size 23);  Surgeon: Alleen Borne, MD;  Location:  [facility] OR;  Service: Open Heart Surgery;  Laterality: N/A;  CIRC ARRESTNEEDS RIGHT RADIAL A-LINE  . COLONOSCOPY WITH PROPOFOL N/A 01/21/2020   Procedure: COLONOSCOPY WITH PROPOFOL;  Surgeon: Lynann Bologna, MD;  Location: [facility] ENDOSCOPY;  Service: Endoscopy;  Laterality: N/A;  . ENTEROSCOPY N/A 01/24/2020   Procedure: ENTEROSCOPY;  Surgeon: Sherrilyn Rist, MD;  Location: [facility] ENDOSCOPY;  Service: Gastroenterology;  Laterality: N/A;  . ESOPHAGOGASTRODUODENOSCOPY (EGD) WITH PROPOFOL N/A 01/21/2020   Procedure: ESOPHAGOGASTRODUODENOSCOPY (EGD) WITH PROPOFOL;  Surgeon: Lynann Bologna, MD;  Location: [facility] ENDOSCOPY;  Service: Endoscopy;  Laterality: N/A;  . GIVENS CAPSULE STUDY N/A 01/22/2020   Procedure: GIVENS CAPSULE STUDY;  Surgeon: Sherrilyn Rist, MD;  Location: [facility] ENDOSCOPY;  Service: Gastroenterology;  Laterality: N/A;  . KNEE ARTHROSCOPY W/ ACL RECONSTRUCTION Right    90's; "2 ATHROSCOPIC , 2 REBUILDS"  . LEFT HEART CATHETERIZATION WITH CORONARY ANGIOGRAM N/A 09/20/2014   Procedure: LEFT HEART CATHETERIZATION WITH CORONARY ANGIOGRAM;  Surgeon: Marykay Lex, MD;  Location: [facility] CATH LAB;  Proximal/ostial LAD 20%, OM2 99% -->> aspiration thrombectomy and DES PCI:   . PERCUTANEOUS CORONARY STENT INTERVENTION (PCI-S)  09/20/2014   Aspiration thrombectomy and DES PCI- OM 2(m-dCx): Promus Premier DES 3.0 mm x 16 mm (3.5 mm)  . RIGHT/LEFT HEART CATH AND CORONARY ANGIOGRAPHY  09/2015   Dr. Nicki Guadalajara: Widely patent OM 2 stent.  Mild disease in RCA and LAD.Relatively normal right heart cath pressures.  Normal  cardiac output.  . TEE WITHOUT CARDIOVERSION N/A 10/09/2015   Procedure: TRANSESOPHAGEAL ECHOCARDIOGRAM (TEE);  Surgeon: Alleen Borne, MD;  Location: [facility] OR;  Service: Open Heart Surgery;  Laterality: N/A;  . TOTAL KNEE ARTHROPLASTY Right 12/09/2017   Procedure: RIGHT TOTAL KNEE ARTHROPLASTY REMOVAL OF HARDWARE RIGHT KNEE;  Surgeon: Jodi Geralds, MD;  Location: [facility] ORS;  Service: Orthopedics;  Laterality:  Right;  . TRANSTHORACIC ECHOCARDIOGRAM  02/2016   Post AVR: mechanical: AVR without obstuction. LVEF improved to 65-70%.  . TRANSTHORACIC ECHOCARDIOGRAM  09/2014, 10/2014   Probable bicuspid aortic valve: a. mod-sev by 2D ECHO 09/2014. AVA 0.9cm^2; b) 10/2014 Echo:. Severe AS Mn-Pk Gradient 41-71 mmHg, AVA ~0.79 cm2, EF 60-65%  . TRANSTHORACIC ECHOCARDIOGRAM  12/2019    Normal WM. Gr 2 DD. Mild LA dilation. 23 mm St. Jude Mechanical AoV well seated.  Trivial AI.     Family History  Problem Relation Age of Onset  . Heart disease Mother   . CAD Neg Hx        per wife    Social History   Socioeconomic History  . Marital status: Married    Spouse name: Not on file  . Number of children: Not on file  . Years of education: Not on file  . Highest education level: Not on file  Occupational History  . Not on file  Tobacco Use  . Smoking status: Never Smoker  . Smokeless tobacco: Former Neurosurgeon    Types: Engineer, drilling  . Vaping Use: Never used  Substance and Sexual Activity  . Alcohol use: Yes    Alcohol/week: 0.0 standard drinks    Comment: socially  . Drug use: No  . Sexual activity: Not on file  Other Topics Concern  . Not on file  Social History Narrative  . Not on file   Social Determinants of Health   Financial Resource Strain: Not on file  Food Insecurity: Not on file  Transportation Needs: Not on file  Physical Activity: Not on file  Stress: Not on file  Social Connections: Not on file  Intimate Partner Violence: Not on file    Outpatient Medications Prior to Visit  Medication Sig Dispense Refill  . acetaminophen (TYLENOL) 325 MG tablet Take 2 tablets (650 mg total) by mouth every 6 (six) hours as needed for mild pain.    . ferrous sulfate 325 (65 FE) MG tablet Take 325 mg by mouth 2 (two) times daily with a meal.    . gemfibrozil (LOPID) 600 MG tablet Take 1 tablet (600 mg total) by mouth in the morning and at bedtime. 180 tablet 3  . rosuvastatin (CRESTOR) 20 MG  tablet Take 1 tablet (20 mg total) by mouth at bedtime. 90 tablet 3  . warfarin (COUMADIN) 10 MG tablet TAKE 1 TABLET DAILY EXCEPT TAKE 1 1/2 ON MONDAYS OR TAKE AS DIRECTED BY ANTICOAGULATION CLINIC 100 tablet 1  . metFORMIN (GLUCOPHAGE) 1000 MG tablet Take 1,000 mg by mouth 2 (two) times daily with a meal.    . carvedilol (COREG) 3.125 MG tablet Take 1 tablet (3.125 mg total) by mouth 2 (two) times daily. 60 tablet 3   No facility-administered medications prior to visit.    No Known Allergies  ROS Review of Systems  Constitutional: Negative for fatigue.  Eyes: Negative for visual disturbance.  Respiratory: Negative for cough, chest tightness and shortness of breath.   Cardiovascular: Negative for chest pain, palpitations and leg swelling.  Endocrine: Negative for polydipsia  and polyuria.  Neurological: Negative for dizziness, syncope, weakness, light-headedness and headaches.      Objective:    Physical Exam Constitutional:      Appearance: He is well-developed.  HENT:     Right Ear: External ear normal.     Left Ear: External ear normal.  Eyes:     Pupils: Pupils are equal, round, and reactive to light.  Neck:     Thyroid: No thyromegaly.  Cardiovascular:     Rate and Rhythm: Normal rate and regular rhythm.  Pulmonary:     Effort: Pulmonary effort is normal. No respiratory distress.     Breath sounds: Normal breath sounds. No wheezing or rales.  Musculoskeletal:     Cervical back: Neck supple.  Neurological:     Mental Status: He is alert and oriented to person, place, and time.     BP 130/80 (BP Location: Left Arm, Patient Position: Sitting, Cuff Size: Normal)   Pulse 72   Temp 97.6 F (36.4 C) (Oral)   Wt 182 lb 3.2 oz (82.6 kg)   SpO2 98%   BMI 28.54 kg/m  Wt Readings from Last 3 Encounters:  02/10/21 182 lb 3.2 oz (82.6 kg)  08/27/20 187 lb (84.8 kg)  07/11/20 182 lb (82.6 kg)     Health Maintenance Due  Topic Date Due  . FOOT EXAM  Never done  .  URINE MICROALBUMIN  Never done  . Hepatitis C Screening  Never done  . Zoster Vaccines- Shingrix (1 of 2) Never done  . COVID-19 Vaccine (3 - Booster for Pfizer series) 05/12/2020    There are no preventive care reminders to display for this patient.  Lab Results  Component Value Date   TSH 3.920 08/28/2020   Lab Results  Component Value Date   WBC 4.1 08/28/2020   HGB 14.5 08/28/2020   HCT 40.8 08/28/2020   MCV 85 08/28/2020   PLT 285 08/28/2020   Lab Results  Component Value Date   NA 136 08/28/2020   K 4.8 08/28/2020   CO2 24 08/28/2020   GLUCOSE 198 (H) 08/28/2020   BUN 11 08/28/2020   CREATININE 0.83 08/28/2020   BILITOT 0.7 08/28/2020   ALKPHOS 42 (L) 08/28/2020   AST 20 08/28/2020   ALT 19 08/28/2020   PROT 7.2 08/28/2020   ALBUMIN 5.0 (H) 08/28/2020   CALCIUM 10.0 08/28/2020   ANIONGAP 12 03/14/2020   Lab Results  Component Value Date   CHOL 137 08/28/2020   Lab Results  Component Value Date   HDL 32 (L) 08/28/2020   Lab Results  Component Value Date   LDLCALC 49 08/28/2020   Lab Results  Component Value Date   TRIG 371 (H) 08/28/2020   Lab Results  Component Value Date   CHOLHDL 4.3 08/28/2020   Lab Results  Component Value Date   HGBA1C 8.7 (A) 02/10/2021      Assessment & Plan:   #1 type 2 diabetes poorly controlled with A1c today 8.7%.  Needs further intervention.  We discussed nonpharmacologic management with reducing sugars and starches.  Continue metformin 1000 g twice daily.  Add Jardiance 10 mg once daily.  Reviewed potential side effects.  He does not have any history of significant renal dysfunction. -Bring back to reassess in 3 months.  #2 history of CAD.  Patient requesting refills of carvedilol. -Carvedilol refilled for 1 year -Strongly encouraged to continue regular follow-up with cardiology.  His lipids were checked last December  #3 history  of chronic Coumadin secondary to mechanical heart valve with prior aortic valve  replacement. -Continue close follow-up with Coumadin clinic   Meds ordered this encounter  Medications  . carvedilol (COREG) 3.125 MG tablet    Sig: Take 1 tablet (3.125 mg total) by mouth 2 (two) times daily.    Dispense:  60 tablet    Refill:  11  . metFORMIN (GLUCOPHAGE) 1000 MG tablet    Sig: Take 1 tablet (1,000 mg total) by mouth 2 (two) times daily with a meal.    Dispense:  60 tablet    Refill:  11  . empagliflozin (JARDIANCE) 10 MG TABS tablet    Sig: Take 1 tablet (10 mg total) by mouth daily before breakfast.    Dispense:  30 tablet    Refill:  11    Follow-up: Return in about 3 months (around 05/13/2021).    Evelena Peat, MD

## 2021-02-17 LAB — POCT INR: INR: 3.1 — AB (ref 2.0–3.0)

## 2021-02-20 ENCOUNTER — Ambulatory Visit (INDEPENDENT_AMBULATORY_CARE_PROVIDER_SITE_OTHER): Payer: BC Managed Care – PPO

## 2021-02-20 DIAGNOSIS — Z7901 Long term (current) use of anticoagulants: Secondary | ICD-10-CM

## 2021-02-20 NOTE — Patient Instructions (Signed)
Pre visit review using our clinic review tool, if applicable. No additional management support is needed unless otherwise documented below in the visit note. 

## 2021-02-24 ENCOUNTER — Ambulatory Visit (INDEPENDENT_AMBULATORY_CARE_PROVIDER_SITE_OTHER): Payer: BC Managed Care – PPO | Admitting: General Practice

## 2021-02-24 DIAGNOSIS — Q230 Congenital stenosis of aortic valve: Secondary | ICD-10-CM

## 2021-02-24 DIAGNOSIS — Z7901 Long term (current) use of anticoagulants: Secondary | ICD-10-CM

## 2021-02-24 DIAGNOSIS — Q231 Congenital insufficiency of aortic valve: Secondary | ICD-10-CM | POA: Diagnosis not present

## 2021-02-24 LAB — POCT INR: INR: 2.8 (ref 2.0–3.0)

## 2021-02-24 NOTE — Patient Instructions (Signed)
Pre visit review using our clinic review tool, if applicable. No additional management support is needed unless otherwise documented below in the visit note.  Pt sent mychart mgs with INR result and MD INR will send results as well.  Continue to take 1 tablet daily except take 1 1/2 tablets on Mondays.  Re-check in 1 week.

## 2021-02-24 NOTE — Progress Notes (Signed)
Medical screening examination/treatment/procedure(s) were performed by non-physician practitioner and as supervising physician I was immediately available for consultation/collaboration. I agree with above. Christan Defranco, MD   

## 2021-03-09 LAB — HM DIABETES EYE EXAM

## 2021-03-12 ENCOUNTER — Encounter: Payer: Self-pay | Admitting: Family Medicine

## 2021-03-13 LAB — POCT INR: INR: 2.7 (ref 2.0–3.0)

## 2021-03-18 ENCOUNTER — Ambulatory Visit (INDEPENDENT_AMBULATORY_CARE_PROVIDER_SITE_OTHER): Payer: BC Managed Care – PPO | Admitting: General Practice

## 2021-03-18 DIAGNOSIS — Q231 Congenital insufficiency of aortic valve: Secondary | ICD-10-CM | POA: Diagnosis not present

## 2021-03-18 DIAGNOSIS — Q230 Congenital stenosis of aortic valve: Secondary | ICD-10-CM

## 2021-03-18 DIAGNOSIS — Z7901 Long term (current) use of anticoagulants: Secondary | ICD-10-CM

## 2021-03-18 NOTE — Patient Instructions (Signed)
Pre visit review using our clinic review tool, if applicable. No additional management support is needed unless otherwise documented below in the visit note.  Continue to take 1 tablet daily except take 1 1/2 tablets on Mondays.  Re-check in 1 week. Patient checks INR with MD INR   Dosing instructions given to patient and patient did verbalize understanding.  *6/22 - 2.7

## 2021-03-18 NOTE — Addendum Note (Signed)
Addended by: Lodema Pilot D on: 03/18/2021 01:22 PM   Modules accepted: Level of Service

## 2021-03-25 ENCOUNTER — Ambulatory Visit (INDEPENDENT_AMBULATORY_CARE_PROVIDER_SITE_OTHER): Payer: BC Managed Care – PPO | Admitting: General Practice

## 2021-03-25 DIAGNOSIS — Q231 Congenital insufficiency of aortic valve: Secondary | ICD-10-CM

## 2021-03-25 DIAGNOSIS — Z7901 Long term (current) use of anticoagulants: Secondary | ICD-10-CM

## 2021-03-25 DIAGNOSIS — Q230 Congenital stenosis of aortic valve: Secondary | ICD-10-CM | POA: Diagnosis not present

## 2021-03-25 LAB — POCT INR: INR: 2.3 (ref 2.0–3.0)

## 2021-03-25 NOTE — Progress Notes (Signed)
I have reviewed and agree with note, evaluation, plan.   Chenille Toor, MD  

## 2021-03-31 ENCOUNTER — Ambulatory Visit (INDEPENDENT_AMBULATORY_CARE_PROVIDER_SITE_OTHER): Payer: BC Managed Care – PPO | Admitting: General Practice

## 2021-03-31 DIAGNOSIS — Q230 Congenital stenosis of aortic valve: Secondary | ICD-10-CM

## 2021-03-31 DIAGNOSIS — Z7901 Long term (current) use of anticoagulants: Secondary | ICD-10-CM

## 2021-03-31 DIAGNOSIS — Q231 Congenital insufficiency of aortic valve: Secondary | ICD-10-CM

## 2021-03-31 LAB — POCT INR: INR: 2.7 (ref 2.0–3.0)

## 2021-03-31 NOTE — Progress Notes (Signed)
Medical screening examination/treatment/procedure(s) were performed by non-physician practitioner and as supervising physician I was immediately available for consultation/collaboration. I agree with above. Anthonymichael Munday, MD   

## 2021-03-31 NOTE — Patient Instructions (Signed)
Pre visit review using our clinic review tool, if applicable. No additional management support is needed unless otherwise documented below in the visit note. 

## 2021-04-13 ENCOUNTER — Ambulatory Visit (INDEPENDENT_AMBULATORY_CARE_PROVIDER_SITE_OTHER): Payer: BC Managed Care – PPO | Admitting: General Practice

## 2021-04-13 DIAGNOSIS — Q230 Congenital stenosis of aortic valve: Secondary | ICD-10-CM

## 2021-04-13 DIAGNOSIS — Q231 Congenital insufficiency of aortic valve: Secondary | ICD-10-CM

## 2021-04-13 DIAGNOSIS — Z7901 Long term (current) use of anticoagulants: Secondary | ICD-10-CM

## 2021-04-13 LAB — POCT INR: INR: 2.2 (ref 2.0–3.0)

## 2021-04-13 NOTE — Patient Instructions (Signed)
Pre visit review using our clinic review tool, if applicable. No additional management support is needed unless otherwise documented below in the visit note.  Take 2 tablets today and then continue to take 1 tablet daily except take 1 1/2 tablets on Mondays.  Re-check in 1 week.  Dosing instructions left on patient's VM. Patient checks INR with MD INR

## 2021-04-14 ENCOUNTER — Ambulatory Visit (INDEPENDENT_AMBULATORY_CARE_PROVIDER_SITE_OTHER): Payer: BC Managed Care – PPO | Admitting: General Practice

## 2021-04-14 DIAGNOSIS — Q231 Congenital insufficiency of aortic valve: Secondary | ICD-10-CM

## 2021-04-14 DIAGNOSIS — Q230 Congenital stenosis of aortic valve: Secondary | ICD-10-CM | POA: Diagnosis not present

## 2021-04-14 DIAGNOSIS — Z7901 Long term (current) use of anticoagulants: Secondary | ICD-10-CM

## 2021-04-14 LAB — POCT INR: INR: 2.8 (ref 2.0–3.0)

## 2021-04-14 NOTE — Progress Notes (Signed)
Medical screening examination/treatment/procedure(s) were performed by non-physician practitioner and as supervising physician I was immediately available for consultation/collaboration. I agree with above. Denitra Donaghey, MD   

## 2021-04-14 NOTE — Patient Instructions (Signed)
Pre visit review using our clinic review tool, if applicable. No additional management support is needed unless otherwise documented below in the visit note.  Continue to take 1 tablet daily except take 1 1/2 tablets on Mondays.  Re-check in 1 week.  Dosing instructions left on patient's My Chart. Patient checks INR with MD INR

## 2021-04-21 LAB — POCT INR: INR: 2.9 (ref 2.0–3.0)

## 2021-04-22 ENCOUNTER — Ambulatory Visit (INDEPENDENT_AMBULATORY_CARE_PROVIDER_SITE_OTHER): Payer: BC Managed Care – PPO

## 2021-04-22 DIAGNOSIS — Z7901 Long term (current) use of anticoagulants: Secondary | ICD-10-CM | POA: Diagnosis not present

## 2021-04-22 NOTE — Patient Instructions (Addendum)
Pre visit review using our clinic review tool, if applicable. No additional management support is needed unless otherwise documented below in the visit note.  Continue to take 1 tablet daily except take 1 1/2 tablets on Mondays.  Re-check in 1 week.  

## 2021-04-28 LAB — POCT INR: INR: 2.4 (ref 2.0–3.0)

## 2021-04-29 ENCOUNTER — Ambulatory Visit (INDEPENDENT_AMBULATORY_CARE_PROVIDER_SITE_OTHER): Payer: BC Managed Care – PPO

## 2021-04-29 DIAGNOSIS — Z7901 Long term (current) use of anticoagulants: Secondary | ICD-10-CM | POA: Diagnosis not present

## 2021-04-29 NOTE — Patient Instructions (Addendum)
Pre visit review using our clinic review tool, if applicable. No additional management support is needed unless otherwise documented below in the visit note.  Increase dose today to take 15mg  and then continue to take 1 tablet daily except take 1 1/2 tablets on Mondays.  Re-check in 1 week.

## 2021-05-05 ENCOUNTER — Ambulatory Visit (INDEPENDENT_AMBULATORY_CARE_PROVIDER_SITE_OTHER): Payer: BC Managed Care – PPO

## 2021-05-05 DIAGNOSIS — Z7901 Long term (current) use of anticoagulants: Secondary | ICD-10-CM

## 2021-05-05 LAB — POCT INR: INR: 3.2 — AB (ref 2.0–3.0)

## 2021-05-05 NOTE — Patient Instructions (Addendum)
Pre visit review using our clinic review tool, if applicable. No additional management support is needed unless otherwise documented below in the visit note.  Continue to take 1 tablet daily except take 1 1/2 tablets on Mondays.  Re-check in 1 week.

## 2021-05-05 NOTE — Progress Notes (Signed)
Medical screening examination/treatment/procedure(s) were performed by non-physician practitioner and as supervising physician I was immediately available for consultation/collaboration. I agree with above. Khalifa Knecht, MD   

## 2021-05-08 ENCOUNTER — Encounter: Payer: Self-pay | Admitting: Family Medicine

## 2021-05-09 ENCOUNTER — Other Ambulatory Visit: Payer: Self-pay | Admitting: Family Medicine

## 2021-05-09 DIAGNOSIS — Z7901 Long term (current) use of anticoagulants: Secondary | ICD-10-CM

## 2021-05-11 NOTE — Telephone Encounter (Signed)
Pt has been compliant with coumadin management.  Last PCP apt 02/10/21 Next PCP apt 05/13/21 Sent in script

## 2021-05-12 ENCOUNTER — Ambulatory Visit (INDEPENDENT_AMBULATORY_CARE_PROVIDER_SITE_OTHER): Payer: BC Managed Care – PPO

## 2021-05-12 ENCOUNTER — Other Ambulatory Visit: Payer: Self-pay

## 2021-05-12 DIAGNOSIS — Z7901 Long term (current) use of anticoagulants: Secondary | ICD-10-CM

## 2021-05-12 LAB — POCT INR: INR: 2.6 (ref 2.0–3.0)

## 2021-05-12 NOTE — Patient Instructions (Signed)
Pre visit review using our clinic review tool, if applicable. No additional management support is needed unless otherwise documented below in the visit note. 

## 2021-05-12 NOTE — Progress Notes (Signed)
Medical screening examination/treatment/procedure(s) were performed by non-physician practitioner and as supervising physician I was immediately available for consultation/collaboration. I agree with above. Demetrio Leighty, MD   

## 2021-05-13 ENCOUNTER — Encounter: Payer: Self-pay | Admitting: Family Medicine

## 2021-05-13 ENCOUNTER — Other Ambulatory Visit: Payer: Self-pay | Admitting: Family Medicine

## 2021-05-13 ENCOUNTER — Ambulatory Visit: Payer: BC Managed Care – PPO | Admitting: Family Medicine

## 2021-05-13 VITALS — BP 110/68 | HR 60 | Temp 97.7°F | Wt 174.7 lb

## 2021-05-13 DIAGNOSIS — E1165 Type 2 diabetes mellitus with hyperglycemia: Secondary | ICD-10-CM

## 2021-05-13 DIAGNOSIS — N529 Male erectile dysfunction, unspecified: Secondary | ICD-10-CM

## 2021-05-13 LAB — POCT GLYCOSYLATED HEMOGLOBIN (HGB A1C): Hemoglobin A1C: 7.7 % — AB (ref 4.0–5.6)

## 2021-05-13 MED ORDER — SILDENAFIL CITRATE 100 MG PO TABS: 50.0000 mg | ORAL_TABLET | Freq: Every day | ORAL | 11 refills | Status: DC | PRN

## 2021-05-13 MED ORDER — EMPAGLIFLOZIN 25 MG PO TABS: 25.0000 mg | ORAL_TABLET | Freq: Every day | ORAL | 11 refills | Status: DC

## 2021-05-13 NOTE — Progress Notes (Signed)
Established Patient Office Visit  Subjective:  Patient ID: Dennis Bates, male    DOB: 1960-12-04  Age: 60 y.o. MRN: [id number]  CC:  Chief Complaint  Patient presents with   Follow-up    diabetes    HPI Dennis Bates presents for medical follow-up.  He was seen back in May had A1c which had climbed to 8.7%.  We started Jardiance 10 mg daily he is tolerating well.  He also takes metformin.  Previous intolerance with Trulicity with nausea.  Not monitoring blood sugars regularly.  No significant polyuria.  He has had only mild urine frequency.  No polydipsia.  Patient relates history of erectile dysfunction.  He would like to consider trial of Viagra.  He has not required nitroglycerin use in years.  He does have history of CAD as well as aortic stenosis.  He is aware that nitroglycerin cannot be mixed with Viagra.  He denies any recent chest pains.  He is on chronic anticoagulation related to his prosthetic valve.  Past Medical History:  Diagnosis Date   Aortic stenosis due to bicuspid aortic valve 09/2014   Severe aortic stenosis of bicuspid valve - s/pAVR with Bentall ( 09/2015)   Bell's palsy    LAST EPISODE 09-2015   CAD S/P percutaneous coronary angioplasty 09/20/2014   a. inferolat STEMI ->> LHC-Angio: Proximal/ostial LAD 20%, OM2 99% -->> PCI: 82mm x 16 mm Promus Premier DES to the OM2.(~3.5 mm)   Diabetes mellitus type 2, controlled, with complications (HCC)    History of mechanical aortic valve replacement 10/09/2015   after 1 yr DAPT for Inferolateral STEMI. (Dr. Laneta Simmers)   Hyperlipidemia    Hypertension    Hypertriglyceridemia    Shoulder arthritis 07/11/2020   Rt Severe GH DJD xray Sep 2021   ST elevation myocardial infarction (STEMI) of inferior wall (HCC) 09/20/2014   99 % occluded Cx-OM2 Promus DES 3.0 mm x 16 mm (3.5 mm)    Past Surgical History:  Procedure Laterality Date   BENTALL PROCEDURE N/A 10/09/2015   Procedure: BENTALL PROCEDURE (using a St Jude  mechanical valve, size 23);  Surgeon: Alleen Borne, MD;  Location: [facility] OR;  Service: Open Heart Surgery;  Laterality: N/A;  CIRC ARRESTNEEDS RIGHT RADIAL A-LINE   COLONOSCOPY WITH PROPOFOL N/A 01/21/2020   Procedure: COLONOSCOPY WITH PROPOFOL;  Surgeon: Lynann Bologna, MD;  Location: [facility] ENDOSCOPY;  Service: Endoscopy;  Laterality: N/A;   ENTEROSCOPY N/A 01/24/2020   Procedure: ENTEROSCOPY;  Surgeon: Sherrilyn Rist, MD;  Location: [facility] ENDOSCOPY;  Service: Gastroenterology;  Laterality: N/A;   ESOPHAGOGASTRODUODENOSCOPY (EGD) WITH PROPOFOL N/A 01/21/2020   Procedure: ESOPHAGOGASTRODUODENOSCOPY (EGD) WITH PROPOFOL;  Surgeon: Lynann Bologna, MD;  Location: [facility] ENDOSCOPY;  Service: Endoscopy;  Laterality: N/A;   GIVENS CAPSULE STUDY N/A 01/22/2020   Procedure: GIVENS CAPSULE STUDY;  Surgeon: Sherrilyn Rist, MD;  Location: [facility] ENDOSCOPY;  Service: Gastroenterology;  Laterality: N/A;   KNEE ARTHROSCOPY W/ ACL RECONSTRUCTION Right    90's; "2 ATHROSCOPIC , 2 REBUILDS"   LEFT HEART CATHETERIZATION WITH CORONARY ANGIOGRAM N/A 09/20/2014   Procedure: LEFT HEART CATHETERIZATION WITH CORONARY ANGIOGRAM;  Surgeon: Marykay Lex, MD;  Location: [facility] CATH LAB;  Proximal/ostial LAD 20%, OM2 99% -->> aspiration thrombectomy and DES PCI:    PERCUTANEOUS CORONARY STENT INTERVENTION (PCI-S)  09/20/2014   Aspiration thrombectomy and DES PCI- OM 2(m-dCx): Promus Premier DES 3.0 mm x 16 mm (3.5 mm)   RIGHT/LEFT HEART CATH AND CORONARY ANGIOGRAPHY  09/2015  Dr. Nicki Guadalajara: Widely patent OM 2 stent.  Mild disease in RCA and LAD.Relatively normal right heart cath pressures.  Normal cardiac output.   TEE WITHOUT CARDIOVERSION N/A 10/09/2015   Procedure: TRANSESOPHAGEAL ECHOCARDIOGRAM (TEE);  Surgeon: Alleen Borne, MD;  [facility] OR;  Service: Open Heart Surgery;  Laterality: N/A;   TOTAL KNEE ARTHROPLASTY Right 12/09/2017   Procedure: RIGHT TOTAL KNEE ARTHROPLASTY REMOVAL OF HARDWARE RIGHT KNEE;  Surgeon: Jodi Geralds,  MD;  Location: [facility] ORS;  Service: Orthopedics;  Laterality: Right;   TRANSTHORACIC ECHOCARDIOGRAM  02/2016   Post AVR: mechanical: AVR without obstuction. LVEF improved to 65-70%.   TRANSTHORACIC ECHOCARDIOGRAM  09/2014, 10/2014   Probable bicuspid aortic valve: a. mod-sev by 2D ECHO 09/2014. AVA 0.9cm^2; b) 10/2014 Echo:. Severe AS Mn-Pk Gradient 41-71 mmHg, AVA ~0.79 cm2, EF 60-65%   TRANSTHORACIC ECHOCARDIOGRAM  12/2019    Normal WM. Gr 2 DD. Mild LA dilation. 23 mm St. Jude Mechanical AoV well seated.  Trivial AI.     Family History  Problem Relation Age of Onset   Heart disease Mother    CAD Neg Hx        per wife    Social History   Socioeconomic History   Marital status: Married    Spouse name: Not on file   Number of children: Not on file   Years of education: Not on file   Highest education level: Not on file  Occupational History   Not on file  Tobacco Use   Smoking status: Never   Smokeless tobacco: Former    Types: Chew    Quit date: 09/01/1985  Vaping Use   Vaping Use: Never used  Substance and Sexual Activity   Alcohol use: Yes    Alcohol/week: 0.0 standard drinks    Comment: socially   Drug use: No   Sexual activity: Not on file  Other Topics Concern   Not on file  Social History Narrative   Not on file   Social Determinants of Health   Financial Resource Strain: Not on file  Food Insecurity: Not on file  Transportation Needs: Not on file  Physical Activity: Not on file  Stress: Not on file  Social Connections: Not on file  Intimate Partner Violence: Not on file    Outpatient Medications Prior to Visit  Medication Sig Dispense Refill   acetaminophen (TYLENOL) 325 MG tablet Take 2 tablets (650 mg total) by mouth every 6 (six) hours as needed for mild pain.     gemfibrozil (LOPID) 600 MG tablet Take 1 tablet (600 mg total) by mouth in the morning and at bedtime. 180 tablet 3   metFORMIN (GLUCOPHAGE) 1000 MG tablet Take 1 tablet (1,000 mg total) by  mouth 2 (two) times daily with a meal. 60 tablet 11   rosuvastatin (CRESTOR) 20 MG tablet Take 1 tablet (20 mg total) by mouth at bedtime. 90 tablet 3   warfarin (COUMADIN) 10 MG tablet TAKE 1 TABLET DAILY EXCEPT TAKE 1 1/2 ON MONDAYS OR TAKE AS DIRECTED BY ANTICOAGULATION CLINIC 100 tablet 1   empagliflozin (JARDIANCE) 10 MG TABS tablet Take 1 tablet (10 mg total) by mouth daily before breakfast. 30 tablet 11   carvedilol (COREG) 3.125 MG tablet Take 1 tablet (3.125 mg total) by mouth 2 (two) times daily. 60 tablet 11   ferrous sulfate 325 (65 FE) MG tablet Take 325 mg by mouth 2 (two) times daily with a meal.     No facility-administered medications  prior to visit.    No Known Allergies  ROS Review of Systems  Constitutional:  Negative for fatigue and unexpected weight change.  Eyes:  Negative for visual disturbance.  Respiratory:  Negative for cough, chest tightness and shortness of breath.   Cardiovascular:  Negative for chest pain, palpitations and leg swelling.  Endocrine: Negative for polydipsia.  Neurological:  Negative for dizziness, syncope, weakness, light-headedness and headaches.     Objective:    Physical Exam Constitutional:      Appearance: He is well-developed.  Eyes:     Pupils: Pupils are equal, round, and reactive to light.  Neck:     Thyroid: No thyromegaly.  Cardiovascular:     Rate and Rhythm: Normal rate and regular rhythm.  Pulmonary:     Effort: Pulmonary effort is normal. No respiratory distress.     Breath sounds: Normal breath sounds. No wheezing or rales.  Musculoskeletal:     Cervical back: Neck supple.     Right lower leg: No edema.     Left lower leg: No edema.  Neurological:     Mental Status: He is alert and oriented to person, place, and time.    BP 110/68 (BP Location: Left Arm, Patient Position: Sitting, Cuff Size: Normal)   Pulse 60   Temp 97.7 F (36.5 C) (Oral)   Wt 174 lb 11.2 oz (79.2 kg)   SpO2 99%   BMI 27.36 kg/m  Wt  Readings from Last 3 Encounters:  05/13/21 174 lb 11.2 oz (79.2 kg)  02/10/21 182 lb 3.2 oz (82.6 kg)  08/27/20 187 lb (84.8 kg)     Health Maintenance Due  Topic Date Due   FOOT EXAM  Never done   URINE MICROALBUMIN  Never done   Hepatitis C Screening  Never done   Zoster Vaccines- Shingrix (1 of 2) Never done   COVID-19 Vaccine (3 - Pfizer risk series) 01/08/2020   Pneumococcal Vaccine 620-59 Years old (2 - PCV) 03/05/2021   INFLUENZA VACCINE  04/13/2021    There are no preventive care reminders to display for this patient.  Lab Results  Component Value Date   TSH 3.920 08/28/2020   Lab Results  Component Value Date   WBC 4.1 08/28/2020   HGB 14.5 08/28/2020   HCT 40.8 08/28/2020   MCV 85 08/28/2020   PLT 285 08/28/2020   Lab Results  Component Value Date   NA 136 08/28/2020   K 4.8 08/28/2020   CO2 24 08/28/2020   GLUCOSE 198 (H) 08/28/2020   BUN 11 08/28/2020   CREATININE 0.83 08/28/2020   BILITOT 0.7 08/28/2020   ALKPHOS 42 (L) 08/28/2020   AST 20 08/28/2020   ALT 19 08/28/2020   PROT 7.2 08/28/2020   ALBUMIN 5.0 (H) 08/28/2020   CALCIUM 10.0 08/28/2020   ANIONGAP 12 03/14/2020   Lab Results  Component Value Date   CHOL 137 08/28/2020   Lab Results  Component Value Date   HDL 32 (L) 08/28/2020   Lab Results  Component Value Date   LDLCALC 49 08/28/2020   Lab Results  Component Value Date   TRIG 371 (H) 08/28/2020   Lab Results  Component Value Date   CHOLHDL 4.3 08/28/2020   Lab Results  Component Value Date   HGBA1C 7.7 (A) 05/13/2021      Assessment & Plan:   #1 type 2 diabetes suboptimally controlled but is improved today.  A1c has gone down from 8.7-7.7.  We discussed options.  He has had previous nausea with Trulicity.  We recommend increasing his Jardiance to 25 mg daily and tighten up diet and recheck in 3 months  #2 erectile dysfunction.  Patient requesting trial of Viagra.  He does have history of CAD but has not used  nitroglycerin years.  He knows that these cannot be mixed.  We agreed to trial Viagra 100 mg 1/2 to 1 tablet daily as needed   Meds ordered this encounter  Medications   empagliflozin (JARDIANCE) 25 MG TABS tablet    Sig: Take 1 tablet (25 mg total) by mouth daily before breakfast.    Dispense:  30 tablet    Refill:  11   sildenafil (VIAGRA) 100 MG tablet    Sig: Take 0.5-1 tablets (50-100 mg total) by mouth daily as needed for erectile dysfunction.    Dispense:  6 tablet    Refill:  11    Follow-up: Return in about 3 months (around 08/12/2021).    Evelena Peat, MD

## 2021-05-13 NOTE — Patient Instructions (Addendum)
Let's increase the Jardiance to 25 mg once daily and repeat A1C at 3 month follow up  Be sure NOT to take nitroglycerin with the Viagra (ie same day).

## 2021-05-15 ENCOUNTER — Other Ambulatory Visit: Payer: Self-pay | Admitting: Family Medicine

## 2021-05-19 ENCOUNTER — Ambulatory Visit (INDEPENDENT_AMBULATORY_CARE_PROVIDER_SITE_OTHER): Payer: BC Managed Care – PPO

## 2021-05-19 DIAGNOSIS — Z7901 Long term (current) use of anticoagulants: Secondary | ICD-10-CM | POA: Diagnosis not present

## 2021-05-19 LAB — POCT INR: INR: 1.9 — AB (ref 2.0–3.0)

## 2021-05-19 NOTE — Patient Instructions (Addendum)
Pre visit review using our clinic review tool, if applicable. No additional management support is needed unless otherwise documented below in the visit note.  Increase dose today and tomorrow to 1 1/2 tablets and then continue to take 1 tablet daily except take 1 1/2 tablets on Mondays.  Re-check in 1 week.

## 2021-05-19 NOTE — Progress Notes (Signed)
Medical screening examination/treatment/procedure(s) were performed by non-physician practitioner and as supervising physician I was immediately available for consultation/collaboration. I agree with above. Wayne Brunker, MD   

## 2021-05-20 NOTE — Telephone Encounter (Signed)
Insurance will not cover any medications used for ED

## 2021-05-21 NOTE — Telephone Encounter (Signed)
Left message for patient to call back  

## 2021-05-26 LAB — POCT INR: INR: 3.1 — AB (ref 2.0–3.0)

## 2021-05-27 ENCOUNTER — Ambulatory Visit (INDEPENDENT_AMBULATORY_CARE_PROVIDER_SITE_OTHER): Payer: BC Managed Care – PPO

## 2021-05-27 DIAGNOSIS — Z7901 Long term (current) use of anticoagulants: Secondary | ICD-10-CM | POA: Diagnosis not present

## 2021-05-27 NOTE — Patient Instructions (Addendum)
Pre visit review using our clinic review tool, if applicable. No additional management support is needed unless otherwise documented below in the visit note.  Continue to take 1 tablet daily except take 1 1/2 tablets on Mondays.  Re-check in 1 week.  Dosing instructions left on patient's My Chart.

## 2021-06-02 ENCOUNTER — Ambulatory Visit (INDEPENDENT_AMBULATORY_CARE_PROVIDER_SITE_OTHER): Payer: BC Managed Care – PPO

## 2021-06-02 DIAGNOSIS — Z7901 Long term (current) use of anticoagulants: Secondary | ICD-10-CM

## 2021-06-02 LAB — POCT INR: INR: 2.6 (ref 2.0–3.0)

## 2021-06-02 NOTE — Patient Instructions (Addendum)
Pre visit review using our clinic review tool, if applicable. No additional management support is needed unless otherwise documented below in the visit note.  Continue to take 1 tablet daily except take 1 1/2 tablets on Mondays.  Re-check in 1 week.  

## 2021-06-09 ENCOUNTER — Ambulatory Visit (INDEPENDENT_AMBULATORY_CARE_PROVIDER_SITE_OTHER): Payer: BC Managed Care – PPO

## 2021-06-09 DIAGNOSIS — Z7901 Long term (current) use of anticoagulants: Secondary | ICD-10-CM | POA: Diagnosis not present

## 2021-06-09 LAB — POCT INR: INR: 2.4 (ref 2.0–3.0)

## 2021-06-09 NOTE — Progress Notes (Signed)
Medical screening examination/treatment/procedure(s) were performed by non-physician practitioner and as supervising physician I was immediately available for consultation/collaboration. I agree with above. Orman Matsumura, MD   

## 2021-06-09 NOTE — Patient Instructions (Addendum)
Pre visit review using our clinic review tool, if applicable. No additional management support is needed unless otherwise documented below in the visit note.  Increase dose today to take 1 1/2 tablets and then c ontinue to take 1 tablet daily except take 1 1/2 tablets on Mondays.  Re-check in 1 week.

## 2021-06-15 ENCOUNTER — Telehealth: Payer: Self-pay | Admitting: Family Medicine

## 2021-06-15 NOTE — Telephone Encounter (Signed)
Patient is requesting a shingles shot and stated that Dr.Burchette discussed it with him at his last visit on 05/13/2021.   Patient would like to be contacted at [phone] with an answer.  Please advise.

## 2021-06-16 ENCOUNTER — Ambulatory Visit (INDEPENDENT_AMBULATORY_CARE_PROVIDER_SITE_OTHER): Payer: BC Managed Care – PPO

## 2021-06-16 DIAGNOSIS — Z7901 Long term (current) use of anticoagulants: Secondary | ICD-10-CM

## 2021-06-16 LAB — POCT INR: INR: 2.5 (ref 2.0–3.0)

## 2021-06-16 NOTE — Patient Instructions (Addendum)
Pre visit review using our clinic review tool, if applicable. No additional management support is needed unless otherwise documented below in the visit note.  Continue to take 1 tablet daily except take 1 1/2 tablets on Mondays.  Re-check in 1 week.  

## 2021-06-16 NOTE — Telephone Encounter (Signed)
Spoke with the patient. He stated he has already been spoken to about this and an appointment has been scheduled.

## 2021-06-19 ENCOUNTER — Ambulatory Visit (INDEPENDENT_AMBULATORY_CARE_PROVIDER_SITE_OTHER): Payer: BC Managed Care – PPO

## 2021-06-19 ENCOUNTER — Other Ambulatory Visit: Payer: Self-pay

## 2021-06-19 DIAGNOSIS — Z23 Encounter for immunization: Secondary | ICD-10-CM

## 2021-06-23 ENCOUNTER — Ambulatory Visit (INDEPENDENT_AMBULATORY_CARE_PROVIDER_SITE_OTHER): Payer: BC Managed Care – PPO

## 2021-06-23 DIAGNOSIS — Z7901 Long term (current) use of anticoagulants: Secondary | ICD-10-CM | POA: Diagnosis not present

## 2021-06-23 LAB — POCT INR: INR: 2 (ref 2.0–3.0)

## 2021-06-23 NOTE — Patient Instructions (Addendum)
Pre visit review using our clinic review tool, if applicable. No additional management support is needed unless otherwise documented below in the visit note.  Increase dose today to take 1 1/2 tablets and increase dose tomorrow to take 1 1/2 tablets and then continue to take 1 tablet daily except take 1 1/2 tablets on Mondays.  Re-check in 1 week.

## 2021-06-30 LAB — POCT INR: INR: 2.5 (ref 2.0–3.0)

## 2021-07-01 ENCOUNTER — Ambulatory Visit (INDEPENDENT_AMBULATORY_CARE_PROVIDER_SITE_OTHER): Payer: BC Managed Care – PPO

## 2021-07-01 DIAGNOSIS — Z7901 Long term (current) use of anticoagulants: Secondary | ICD-10-CM | POA: Diagnosis not present

## 2021-07-01 NOTE — Patient Instructions (Addendum)
Pre visit review using our clinic review tool, if applicable. No additional management support is needed unless otherwise documented below in the visit note.  Change weekly dose to take 1 tablet daily except take 1 1/2 tablets on Mondays and Thursdays.  Re-check in 1 week.

## 2021-07-07 LAB — POCT INR: INR: 2.6 (ref 2.0–3.0)

## 2021-07-08 ENCOUNTER — Ambulatory Visit (INDEPENDENT_AMBULATORY_CARE_PROVIDER_SITE_OTHER): Payer: BC Managed Care – PPO

## 2021-07-08 DIAGNOSIS — Z7901 Long term (current) use of anticoagulants: Secondary | ICD-10-CM | POA: Diagnosis not present

## 2021-07-08 NOTE — Patient Instructions (Addendum)
Pre visit review using our clinic review tool, if applicable. No additional management support is needed unless otherwise documented below in the visit note.  Continue 1 tablet daily except take 1 1/2 tablets on Mondays and Thursdays.  Re-check in 1 week. 

## 2021-07-10 ENCOUNTER — Telehealth (INDEPENDENT_AMBULATORY_CARE_PROVIDER_SITE_OTHER): Payer: BC Managed Care – PPO | Admitting: Family Medicine

## 2021-07-10 DIAGNOSIS — J069 Acute upper respiratory infection, unspecified: Secondary | ICD-10-CM | POA: Diagnosis not present

## 2021-07-10 DIAGNOSIS — J040 Acute laryngitis: Secondary | ICD-10-CM | POA: Diagnosis not present

## 2021-07-10 MED ORDER — PREDNISONE 20 MG PO TABS: ORAL_TABLET | ORAL | 0 refills | Status: DC

## 2021-07-10 MED ORDER — ALBUTEROL SULFATE HFA 108 (90 BASE) MCG/ACT IN AERS: 2.0000 | INHALATION_SPRAY | Freq: Four times a day (QID) | RESPIRATORY_TRACT | 0 refills | Status: DC | PRN

## 2021-07-10 NOTE — Progress Notes (Signed)
Patient ID: Dennis Bates, male   DOB: Jan 18, 1961, 60 y.o.   MRN: [id number]

## 2021-07-10 NOTE — Progress Notes (Signed)
Patient ID: Dennis Bates, male   DOB: 04/03/1961, 60 y.o.   MRN: [id number]  This visit type was conducted due to national recommendations for restrictions regarding the COVID-19 pandemic in an effort to limit this patient's exposure and mitigate transmission in our community.   Virtual Visit via Video Note  I connected with [name] Quaintance on 07/10/21 at  2:15 PM EDT by a video enabled telemedicine application and verified that I am speaking with the correct person using two identifiers.  Location patient: home Location provider:work or home office Persons participating in the virtual visit: patient, provider  I discussed the limitations of evaluation and management by telemedicine and the availability of in person appointments. The patient expressed understanding and agreed to proceed.   HPI:  [name] called with upper respiratory symptoms with onset on Tuesday this week.  He states his grandchildren were recently visiting and apparently couple of them were recently diagnosed with RSV.  His wife has similar symptoms.  [name] relates some sinus congestion, laryngitis, upper airway inflammation.  He is aware of some wheezing intermittently.  No history of asthma.  Non-smoker.  Does have type 2 diabetes.  Most recent A1c 7.7%.  Some mild sore throat symptoms.  No nausea or vomiting.  His wife very similar symptoms and had influenza and COVID testing both negative.   ROS: See pertinent positives and negatives per HPI.  Past Medical History:  Diagnosis Date   Aortic stenosis due to bicuspid aortic valve 09/2014   Severe aortic stenosis of bicuspid valve - s/pAVR with Bentall ( 09/2015)   Bell's palsy    LAST EPISODE 09-2015   CAD S/P percutaneous coronary angioplasty 09/20/2014   a. inferolat STEMI ->> LHC-Angio: Proximal/ostial LAD 20%, OM2 99% -->> PCI: 15mm x 16 mm Promus Premier DES to the OM2.(~3.5 mm)   Diabetes mellitus type 2, controlled, with complications (HCC)    History of mechanical  aortic valve replacement 10/09/2015   after 1 yr DAPT for Inferolateral STEMI. (Dr. Laneta Simmers)   Hyperlipidemia    Hypertension    Hypertriglyceridemia    Shoulder arthritis 07/11/2020   Rt Severe GH DJD xray Sep 2021   ST elevation myocardial infarction (STEMI) of inferior wall (HCC) 09/20/2014   99 % occluded Cx-OM2 Promus DES 3.0 mm x 16 mm (3.5 mm)    Past Surgical History:  Procedure Laterality Date   BENTALL PROCEDURE N/A 10/09/2015   Procedure: BENTALL PROCEDURE (using a St Jude mechanical valve, size 23);  Surgeon: Alleen Borne, MD;  Location: [facility] OR;  Service: Open Heart Surgery;  Laterality: N/A;  CIRC ARRESTNEEDS RIGHT RADIAL A-LINE   COLONOSCOPY WITH PROPOFOL N/A 01/21/2020   Procedure: COLONOSCOPY WITH PROPOFOL;  Surgeon: Lynann Bologna, MD;  Location: [facility] ENDOSCOPY;  Service: Endoscopy;  Laterality: N/A;   ENTEROSCOPY N/A 01/24/2020   Procedure: ENTEROSCOPY;  Surgeon: Sherrilyn Rist, MD;  Location: [facility] ENDOSCOPY;  Service: Gastroenterology;  Laterality: N/A;   ESOPHAGOGASTRODUODENOSCOPY (EGD) WITH PROPOFOL N/A 01/21/2020   Procedure: ESOPHAGOGASTRODUODENOSCOPY (EGD) WITH PROPOFOL;  Surgeon: Lynann Bologna, MD;  Location: [facility] ENDOSCOPY;  Service: Endoscopy;  Laterality: N/A;   GIVENS CAPSULE STUDY N/A 01/22/2020   Procedure: GIVENS CAPSULE STUDY;  Surgeon: Sherrilyn Rist, MD;  Location: [facility] ENDOSCOPY;  Service: Gastroenterology;  Laterality: N/A;   KNEE ARTHROSCOPY W/ ACL RECONSTRUCTION Right    90's; "2 ATHROSCOPIC , 2 REBUILDS"   LEFT HEART CATHETERIZATION WITH CORONARY ANGIOGRAM N/A 09/20/2014   Procedure: LEFT HEART CATHETERIZATION WITH CORONARY  Rosalin Hawking;  Surgeon: Marykay Lex, MD;  Location: [facility] CATH LAB;  Proximal/ostial LAD 20%, OM2 99% -->> aspiration thrombectomy and DES PCI:    PERCUTANEOUS CORONARY STENT INTERVENTION (PCI-S)  09/20/2014   Aspiration thrombectomy and DES PCI- OM 2(m-dCx): Promus Premier DES 3.0 mm x 16 mm (3.5 mm)   RIGHT/LEFT HEART CATH AND CORONARY  ANGIOGRAPHY  09/2015   Dr. Nicki Guadalajara: Widely patent OM 2 stent.  Mild disease in RCA and LAD.Relatively normal right heart cath pressures.  Normal cardiac output.   TEE WITHOUT CARDIOVERSION N/A 10/09/2015   Procedure: TRANSESOPHAGEAL ECHOCARDIOGRAM (TEE);  Surgeon: Alleen Borne, MD;  Location: [facility] OR;  Service: Open Heart Surgery;  Laterality: N/A;   TOTAL KNEE ARTHROPLASTY Right 12/09/2017   Procedure: RIGHT TOTAL KNEE ARTHROPLASTY REMOVAL OF HARDWARE RIGHT KNEE;  Surgeon: Jodi Geralds, MD;  Location: [facility] ORS;  Service: Orthopedics;  Laterality: Right;   TRANSTHORACIC ECHOCARDIOGRAM  02/2016   Post AVR: mechanical: AVR without obstuction. LVEF improved to 65-70%.   TRANSTHORACIC ECHOCARDIOGRAM  09/2014, 10/2014   Probable bicuspid aortic valve: a. mod-sev by 2D ECHO 09/2014. AVA 0.9cm^2; b) 10/2014 Echo:. Severe AS Mn-Pk Gradient 41-71 mmHg, AVA ~0.79 cm2, EF 60-65%   TRANSTHORACIC ECHOCARDIOGRAM  12/2019    Normal WM. Gr 2 DD. Mild LA dilation. 23 mm St. Jude Mechanical AoV well seated.  Trivial AI.     Family History  Problem Relation Age of Onset   Heart disease Mother    CAD Neg Hx        per wife    SOCIAL HX: Non-smoker   Current Outpatient Medications:    acetaminophen (TYLENOL) 325 MG tablet, Take 2 tablets (650 mg total) by mouth every 6 (six) hours as needed for mild pain., Disp: , Rfl:    albuterol (VENTOLIN HFA) 108 (90 Base) MCG/ACT inhaler, Inhale 2 puffs into the lungs every 6 (six) hours as needed for wheezing or shortness of breath., Disp: 8 g, Rfl: 0   empagliflozin (JARDIANCE) 25 MG TABS tablet, Take 1 tablet (25 mg total) by mouth daily before breakfast., Disp: 30 tablet, Rfl: 11   gemfibrozil (LOPID) 600 MG tablet, Take 1 tablet (600 mg total) by mouth in the morning and at bedtime., Disp: 180 tablet, Rfl: 3   metFORMIN (GLUCOPHAGE) 1000 MG tablet, Take 1 tablet (1,000 mg total) by mouth 2 (two) times daily with a meal., Disp: 60 tablet, Rfl: 11   predniSONE  (DELTASONE) 20 MG tablet, Take 2 tablets by mouth once daily for 5 days, Disp: 10 tablet, Rfl: 0   rosuvastatin (CRESTOR) 20 MG tablet, Take 1 tablet (20 mg total) by mouth at bedtime., Disp: 90 tablet, Rfl: 3   tadalafil (CIALIS) 20 MG tablet, Take 1 tablet every other day as needed for erectile dysfunction, Disp: 6 tablet, Rfl: 11   warfarin (COUMADIN) 10 MG tablet, TAKE 1 TABLET DAILY EXCEPT TAKE 1 1/2 ON MONDAYS OR TAKE AS DIRECTED BY ANTICOAGULATION CLINIC, Disp: 100 tablet, Rfl: 1   carvedilol (COREG) 3.125 MG tablet, Take 1 tablet (3.125 mg total) by mouth 2 (two) times daily., Disp: 60 tablet, Rfl: 11  EXAM:  VITALS per patient if applicable:  GENERAL: alert, oriented, appears well and in no acute distress  HEENT: atraumatic, conjunttiva clear, no obvious abnormalities on inspection of external nose and ears  NECK: normal movements of the head and neck  LUNGS: on inspection no signs of respiratory distress, breathing rate appears normal, no obvious gross SOB, gasping  or wheezing  CV: no obvious cyanosis  MS: moves all visible extremities without noticeable abnormality  PSYCH/NEURO: pleasant and cooperative, no obvious depression or anxiety, speech and thought processing grossly intact  ASSESSMENT AND PLAN:  Discussed the following assessment and plan:  Viral upper respiratory tract infection  Laryngitis  Patient relates 4-day history of upper respiratory symptoms.  Recent exposure to grandchildren with RSV.  We Splane this certainly could be RSV but could represent some other virus as well.  He is in no respiratory distress.  He does describe some intermittent wheezing.  -Albuterol MDI 2 puffs every 4 hours as needed for wheezing -Cautious short-term trial of prednisone 20 mg take 2 tablets once daily for 5 days.  Monitor blood sugars closely.  He is aware blood sugars will likely be up for the next few days -Follow-up promptly for any fever or any worsening symptoms     I discussed the assessment and treatment plan with the patient. The patient was provided an opportunity to ask questions and all were answered. The patient agreed with the plan and demonstrated an understanding of the instructions.   The patient was advised to call back or seek an in-person evaluation if the symptoms worsen or if the condition fails to improve as anticipated.     Evelena Peat, MD

## 2021-07-14 ENCOUNTER — Encounter: Payer: Self-pay | Admitting: Family Medicine

## 2021-07-14 ENCOUNTER — Ambulatory Visit (INDEPENDENT_AMBULATORY_CARE_PROVIDER_SITE_OTHER): Payer: BC Managed Care – PPO

## 2021-07-14 DIAGNOSIS — Z7901 Long term (current) use of anticoagulants: Secondary | ICD-10-CM

## 2021-07-14 LAB — POCT INR: INR: 2.9 (ref 2.0–3.0)

## 2021-07-14 NOTE — Progress Notes (Signed)
Continue 1 tablet daily except take 1 1/2 tablets on Mondays and Thursdays.  Re-check in 1 week.   

## 2021-07-14 NOTE — Progress Notes (Signed)
Patient ID: Dennis Bates, male   DOB: 04-03-1961, 60 y.o.   MRN: [id number] Medical screening examination/treatment/procedure(s) were performed by non-physician practitioner and as supervising physician I was immediately available for consultation/collaboration.  I agree with above. Oliver Barre, MD

## 2021-07-14 NOTE — Telephone Encounter (Signed)
Please advise. Follow up appointment needed?

## 2021-07-14 NOTE — Patient Instructions (Addendum)
Pre visit review using our clinic review tool, if applicable. No additional management support is needed unless otherwise documented below in the visit note.  Continue 1 tablet daily except take 1 1/2 tablets on Mondays and Thursdays.  Re-check in 1 week. 

## 2021-07-21 LAB — POCT INR: INR: 1.9 — AB (ref 2.0–3.0)

## 2021-07-22 ENCOUNTER — Ambulatory Visit (INDEPENDENT_AMBULATORY_CARE_PROVIDER_SITE_OTHER): Payer: BC Managed Care – PPO

## 2021-07-22 DIAGNOSIS — Z7901 Long term (current) use of anticoagulants: Secondary | ICD-10-CM | POA: Diagnosis not present

## 2021-07-22 NOTE — Patient Instructions (Addendum)
Pre visit review using our clinic review tool, if applicable. No additional management support is needed unless otherwise documented below in the visit note.  Increase dose today to 15mg  and increase dose tomorrow to 20mg  and then continue 1 tablet daily except take 1 1/2 tablets on Mondays and Thursdays.  Re-check in 1 week.

## 2021-07-22 NOTE — Progress Notes (Signed)
Increase dose today to 15mg  and increase dose tomorrow to 20mg  and then continue 1 tablet daily except take 1 1/2 tablets on Mondays and Thursdays.  Re-check in 1 week.

## 2021-07-28 LAB — POCT INR: INR: 2.9 (ref 2.0–3.0)

## 2021-07-29 ENCOUNTER — Ambulatory Visit (INDEPENDENT_AMBULATORY_CARE_PROVIDER_SITE_OTHER): Payer: BC Managed Care – PPO

## 2021-07-29 DIAGNOSIS — Z7901 Long term (current) use of anticoagulants: Secondary | ICD-10-CM | POA: Diagnosis not present

## 2021-07-29 NOTE — Progress Notes (Signed)
Continue 1 tablet daily except take 1 1/2 tablets on Mondays and Thursdays.  Re-check in 1 week.

## 2021-07-29 NOTE — Patient Instructions (Addendum)
Pre visit review using our clinic review tool, if applicable. No additional management support is needed unless otherwise documented below in the visit note.  Continue 1 tablet daily except take 1 1/2 tablets on Mondays and Thursdays.  Re-check in 1 week. 

## 2021-08-04 LAB — POCT INR: INR: 2.7 (ref 2.0–3.0)

## 2021-08-05 ENCOUNTER — Ambulatory Visit (INDEPENDENT_AMBULATORY_CARE_PROVIDER_SITE_OTHER): Payer: BC Managed Care – PPO

## 2021-08-05 DIAGNOSIS — Z7901 Long term (current) use of anticoagulants: Secondary | ICD-10-CM

## 2021-08-05 NOTE — Progress Notes (Signed)
I have reviewed and agree with note, evaluation, plan.   Lacora Folmer, MD  

## 2021-08-05 NOTE — Patient Instructions (Addendum)
Pre visit review using our clinic review tool, if applicable. No additional management support is needed unless otherwise documented below in the visit note.  Continue 1 tablet daily except take 1 1/2 tablets on Mondays and Thursdays.  Re-check in 1 week. 

## 2021-08-05 NOTE — Progress Notes (Signed)
Continue 1 tablet daily except take 1 1/2 tablets on Mondays and Thursdays.  Re-check in 1 week.

## 2021-08-11 LAB — POCT INR: INR: 2.7 (ref 2.0–3.0)

## 2021-08-14 ENCOUNTER — Ambulatory Visit (INDEPENDENT_AMBULATORY_CARE_PROVIDER_SITE_OTHER): Payer: BC Managed Care – PPO

## 2021-08-14 DIAGNOSIS — Q230 Congenital stenosis of aortic valve: Secondary | ICD-10-CM

## 2021-08-14 DIAGNOSIS — Z7901 Long term (current) use of anticoagulants: Secondary | ICD-10-CM

## 2021-08-14 DIAGNOSIS — Q231 Congenital insufficiency of aortic valve: Secondary | ICD-10-CM

## 2021-08-14 NOTE — Progress Notes (Signed)
Continue 1 tablet daily except take 1 1/2 tablets on Mondays and Thursdays.  Re-check in 1 week.   

## 2021-08-14 NOTE — Patient Instructions (Addendum)
Pre visit review using our clinic review tool, if applicable. No additional management support is needed unless otherwise documented below in the visit note.  Continue 1 tablet daily except take 1 1/2 tablets on Mondays and Thursdays.  Re-check in 1 week. 

## 2021-08-18 ENCOUNTER — Ambulatory Visit (INDEPENDENT_AMBULATORY_CARE_PROVIDER_SITE_OTHER): Payer: BC Managed Care – PPO

## 2021-08-18 DIAGNOSIS — Z7901 Long term (current) use of anticoagulants: Secondary | ICD-10-CM | POA: Diagnosis not present

## 2021-08-18 LAB — POCT INR: INR: 3.1 — AB (ref 2.0–3.0)

## 2021-08-18 NOTE — Patient Instructions (Addendum)
Pre visit review using our clinic review tool, if applicable. No additional management support is needed unless otherwise documented below in the visit note.  Continue 1 tablet daily except take 1 1/2 tablets on Mondays and Thursdays.  Re-check in 1 week. 

## 2021-08-18 NOTE — Progress Notes (Signed)
Continue 1 tablet daily except take 1 1/2 tablets on Mondays and Thursdays.  Re-check in 1 week.

## 2021-08-24 ENCOUNTER — Ambulatory Visit: Payer: BC Managed Care – PPO

## 2021-08-24 ENCOUNTER — Ambulatory Visit: Payer: BC Managed Care – PPO | Admitting: Family Medicine

## 2021-08-24 VITALS — BP 118/62 | HR 60 | Temp 97.6°F | Wt 172.3 lb

## 2021-08-24 DIAGNOSIS — Z23 Encounter for immunization: Secondary | ICD-10-CM

## 2021-08-24 DIAGNOSIS — E1165 Type 2 diabetes mellitus with hyperglycemia: Secondary | ICD-10-CM | POA: Diagnosis not present

## 2021-08-24 LAB — POCT GLYCOSYLATED HEMOGLOBIN (HGB A1C): Hemoglobin A1C: 7.3 % — AB (ref 4.0–5.6)

## 2021-08-24 NOTE — Addendum Note (Signed)
Addended by: Solon Augusta on: 08/24/2021 10:06 AM   Modules accepted: Orders

## 2021-08-24 NOTE — Progress Notes (Signed)
Established Patient Office Visit  Subjective:  Patient ID: Dennis Bates, male    DOB: [birth year]  Age: 60 y.o. MRN: [id number]  CC:  Chief Complaint  Patient presents with   Follow-up    diabetes    HPI [name] presents for follow-up regarding type 2 diabetes.  About 6 months ago his A1c was 8.7%.  We started Jardiance and titrated this last visit to 25 mg after A1c of 7.7%.  A1c today 7.3%.  He is walking about half miles per day but no other exercise.  He does folic he can tighten his diet up somewhat.  He has had previous intolerance with both Trulicity and Ozempic.  He had nausea with both.  He does remain on metformin as well.  Past Medical History:  Diagnosis Date   Aortic stenosis due to bicuspid aortic valve 09/2014   Severe aortic stenosis of bicuspid valve - s/pAVR with Bentall ( 09/2015)   Bell's palsy    LAST EPISODE 09-2015   CAD S/P percutaneous coronary angioplasty 09/20/2014   a. inferolat STEMI ->> LHC-Angio: Proximal/ostial LAD 20%, OM2 99% -->> PCI: 75mm x 16 mm Promus Premier DES to the OM2.(~3.5 mm)   Diabetes mellitus type 2, controlled, with complications (HCC)    History of mechanical aortic valve replacement 10/09/2015   after 1 yr DAPT for Inferolateral STEMI. (Dr. Laneta Simmers)   Hyperlipidemia    Hypertension    Hypertriglyceridemia    Shoulder arthritis 07/11/2020   Rt Severe GH DJD xray Sep 2021   ST elevation myocardial infarction (STEMI) of inferior wall (HCC) 09/20/2014   99 % occluded Cx-OM2 Promus DES 3.0 mm x 16 mm (3.5 mm)    Past Surgical History:  Procedure Laterality Date   BENTALL PROCEDURE N/A 10/09/2015   Procedure: BENTALL PROCEDURE (using a St Jude mechanical valve, size 23);  Surgeon: Alleen Borne, MD;  Location: [facility] OR;  Service: Open Heart Surgery;  Laterality: N/A;  CIRC ARRESTNEEDS RIGHT RADIAL A-LINE   COLONOSCOPY WITH PROPOFOL N/A 01/21/2020   Procedure: COLONOSCOPY WITH PROPOFOL;  Surgeon: Lynann Bologna, MD;  Location:  [facility] ENDOSCOPY;  Service: Endoscopy;  Laterality: N/A;   ENTEROSCOPY N/A 01/24/2020   Procedure: ENTEROSCOPY;  Surgeon: Sherrilyn Rist, MD;  Location: [facility] ENDOSCOPY;  Service: Gastroenterology;  Laterality: N/A;   ESOPHAGOGASTRODUODENOSCOPY (EGD) WITH PROPOFOL N/A 01/21/2020   Procedure: ESOPHAGOGASTRODUODENOSCOPY (EGD) WITH PROPOFOL;  Surgeon: Lynann Bologna, MD;  Location: [facility] ENDOSCOPY;  Service: Endoscopy;  Laterality: N/A;   GIVENS CAPSULE STUDY N/A 01/22/2020   Procedure: GIVENS CAPSULE STUDY;  Surgeon: Sherrilyn Rist, MD;  Location: [facility] ENDOSCOPY;  Service: Gastroenterology;  Laterality: N/A;   KNEE ARTHROSCOPY W/ ACL RECONSTRUCTION Right    90's; "2 ATHROSCOPIC , 2 REBUILDS"   LEFT HEART CATHETERIZATION WITH CORONARY ANGIOGRAM N/A 09/20/2014   Procedure: LEFT HEART CATHETERIZATION WITH CORONARY ANGIOGRAM;  Surgeon: Marykay Lex, MD;  Location: [facility] CATH LAB;  Proximal/ostial LAD 20%, OM2 99% -->> aspiration thrombectomy and DES PCI:    PERCUTANEOUS CORONARY STENT INTERVENTION (PCI-S)  09/20/2014   Aspiration thrombectomy and DES PCI- OM 2(m-dCx): Promus Premier DES 3.0 mm x 16 mm (3.5 mm)   RIGHT/LEFT HEART CATH AND CORONARY ANGIOGRAPHY  09/2015   Dr. Nicki Guadalajara: Widely patent OM 2 stent.  Mild disease in RCA and LAD.Relatively normal right heart cath pressures.  Normal cardiac output.   TEE WITHOUT CARDIOVERSION N/A 10/09/2015   Procedure: TRANSESOPHAGEAL ECHOCARDIOGRAM (TEE);  Surgeon: Payton Doughty  Laneta Simmers, MD;  Location: [facility] OR;  Service: Open Heart Surgery;  Laterality: N/A;   TOTAL KNEE ARTHROPLASTY Right 12/09/2017   Procedure: RIGHT TOTAL KNEE ARTHROPLASTY REMOVAL OF HARDWARE RIGHT KNEE;  Surgeon: Jodi Geralds, MD;  Location: [facility] ORS;  Service: Orthopedics;  Laterality: Right;   TRANSTHORACIC ECHOCARDIOGRAM  02/2016   Post AVR: mechanical: AVR without obstuction. LVEF improved to 65-70%.   TRANSTHORACIC ECHOCARDIOGRAM  09/2014, 10/2014   Probable bicuspid aortic valve: a. mod-sev by 2D ECHO  09/2014. AVA 0.9cm^2; b) 10/2014 Echo:. Severe AS Mn-Pk Gradient 41-71 mmHg, AVA ~0.79 cm2, EF 60-65%   TRANSTHORACIC ECHOCARDIOGRAM  12/2019    Normal WM. Gr 2 DD. Mild LA dilation. 23 mm St. Jude Mechanical AoV well seated.  Trivial AI.     Family History  Problem Relation Age of Onset   Heart disease Mother    CAD Neg Hx        per wife    Social History   Socioeconomic History   Marital status: Married    Spouse name: Not on file   Number of children: Not on file   Years of education: Not on file   Highest education level: Not on file  Occupational History   Not on file  Tobacco Use   Smoking status: Never   Smokeless tobacco: Former    Types: Chew    Quit date: 09/01/1985  Vaping Use   Vaping Use: Never used  Substance and Sexual Activity   Alcohol use: Yes    Alcohol/week: 0.0 standard drinks    Comment: socially   Drug use: No   Sexual activity: Not on file  Other Topics Concern   Not on file  Social History Narrative   Not on file   Social Determinants of Health   Financial Resource Strain: Not on file  Food Insecurity: Not on file  Transportation Needs: Not on file  Physical Activity: Not on file  Stress: Not on file  Social Connections: Not on file  Intimate Partner Violence: Not on file    Outpatient Medications Prior to Visit  Medication Sig Dispense Refill   acetaminophen (TYLENOL) 325 MG tablet Take 2 tablets (650 mg total) by mouth every 6 (six) hours as needed for mild pain.     albuterol (VENTOLIN HFA) 108 (90 Base) MCG/ACT inhaler Inhale 2 puffs into the lungs every 6 (six) hours as needed for wheezing or shortness of breath. 8 g 0   empagliflozin (JARDIANCE) 25 MG TABS tablet Take 1 tablet (25 mg total) by mouth daily before breakfast. 30 tablet 11   gemfibrozil (LOPID) 600 MG tablet Take 1 tablet (600 mg total) by mouth in the morning and at bedtime. 180 tablet 3   metFORMIN (GLUCOPHAGE) 1000 MG tablet Take 1 tablet (1,000 mg total) by mouth  2 (two) times daily with a meal. 60 tablet 11   rosuvastatin (CRESTOR) 20 MG tablet Take 1 tablet (20 mg total) by mouth at bedtime. 90 tablet 3   tadalafil (CIALIS) 20 MG tablet Take 1 tablet every other day as needed for erectile dysfunction 6 tablet 11   warfarin (COUMADIN) 10 MG tablet TAKE 1 TABLET DAILY EXCEPT TAKE 1 1/2 ON MONDAYS OR TAKE AS DIRECTED BY ANTICOAGULATION CLINIC 100 tablet 1   predniSONE (DELTASONE) 20 MG tablet Take 2 tablets by mouth once daily for 5 days 10 tablet 0   carvedilol (COREG) 3.125 MG tablet Take 1 tablet (3.125 mg total) by mouth 2 (two) times daily.  60 tablet 11   No facility-administered medications prior to visit.    No Known Allergies  ROS Review of Systems  Respiratory:  Negative for shortness of breath.   Cardiovascular:  Negative for chest pain.  Endocrine: Negative for polydipsia and polyuria.     Objective:    Physical Exam Vitals reviewed.  Constitutional:      Appearance: Normal appearance.  Cardiovascular:     Rate and Rhythm: Normal rate.  Pulmonary:     Effort: Pulmonary effort is normal.     Breath sounds: Normal breath sounds.  Neurological:     Mental Status: He is alert.    BP 118/62 (BP Location: Left Arm, Patient Position: Sitting, Cuff Size: Normal)   Pulse 60   Temp 97.6 F (36.4 C) (Oral)   Wt 172 lb 4.8 oz (78.2 kg)   SpO2 99%   BMI 26.99 kg/m  Wt Readings from Last 3 Encounters:  08/24/21 172 lb 4.8 oz (78.2 kg)  05/13/21 174 lb 11.2 oz (79.2 kg)  02/10/21 182 lb 3.2 oz (82.6 kg)     Health Maintenance Due  Topic Date Due   FOOT EXAM  Never done   URINE MICROALBUMIN  Never done   Hepatitis C Screening  Never done   COVID-19 Vaccine (3 - Pfizer risk series) 01/08/2020   Pneumococcal Vaccine 67-25 Years old (2 - PCV) 03/05/2021   INFLUENZA VACCINE  Never done   Zoster Vaccines- Shingrix (2 of 2) 08/14/2021    There are no preventive care reminders to display for this patient.  Lab Results   Component Value Date   TSH 3.920 08/28/2020   Lab Results  Component Value Date   WBC 4.1 08/28/2020   HGB 14.5 08/28/2020   HCT 40.8 08/28/2020   MCV 85 08/28/2020   PLT 285 08/28/2020   Lab Results  Component Value Date   NA 136 08/28/2020   K 4.8 08/28/2020   CO2 24 08/28/2020   GLUCOSE 198 (H) 08/28/2020   BUN 11 08/28/2020   CREATININE 0.83 08/28/2020   BILITOT 0.7 08/28/2020   ALKPHOS 42 (L) 08/28/2020   AST 20 08/28/2020   ALT 19 08/28/2020   PROT 7.2 08/28/2020   ALBUMIN 5.0 (H) 08/28/2020   CALCIUM 10.0 08/28/2020   ANIONGAP 12 03/14/2020   Lab Results  Component Value Date   CHOL 137 08/28/2020   Lab Results  Component Value Date   HDL 32 (L) 08/28/2020   Lab Results  Component Value Date   LDLCALC 49 08/28/2020   Lab Results  Component Value Date   TRIG 371 (H) 08/28/2020   Lab Results  Component Value Date   CHOLHDL 4.3 08/28/2020   Lab Results  Component Value Date   HGBA1C 7.3 (A) 08/24/2021      Assessment & Plan:   Problem List Items Addressed This Visit       Unprioritized   Type 2 diabetes mellitus with hyperglycemia (HCC) - Primary   Relevant Orders   POCT glycosylated hemoglobin (Hb A1C) (Completed)  Type 2 diabetes gradually improving with A1c 7.3%.  We discussed potential additional medication such as possible Januvia versus 3 months of continue lifestyle modification reassessing and he prefers the latter.  He has been intolerant of least couple of GLP-1 medications previously.  No orders of the defined types were placed in this encounter.   Follow-up: Return in about 3 months (around 11/22/2021).    Evelena Peat, MD

## 2021-08-26 ENCOUNTER — Ambulatory Visit (INDEPENDENT_AMBULATORY_CARE_PROVIDER_SITE_OTHER): Payer: BC Managed Care – PPO

## 2021-08-26 DIAGNOSIS — Z7901 Long term (current) use of anticoagulants: Secondary | ICD-10-CM | POA: Diagnosis not present

## 2021-08-26 LAB — POCT INR: INR: 2.6 (ref 2.0–3.0)

## 2021-08-26 NOTE — Patient Instructions (Addendum)
Pre visit review using our clinic review tool, if applicable. No additional management support is needed unless otherwise documented below in the visit note.  Continue 1 tablet daily except take 1 1/2 tablets on Mondays and Thursdays.  Re-check in 2 week due to coumadin clinic being closed next week.Dennis Bates

## 2021-08-26 NOTE — Progress Notes (Signed)
Continue 1 tablet daily except take 1 1/2 tablets on Mondays and Thursdays.  Re-check in 2 week due to coumadin clinic being closed next week.Marland Kitchen

## 2021-09-08 ENCOUNTER — Ambulatory Visit (INDEPENDENT_AMBULATORY_CARE_PROVIDER_SITE_OTHER): Payer: BC Managed Care – PPO

## 2021-09-08 DIAGNOSIS — Z7901 Long term (current) use of anticoagulants: Secondary | ICD-10-CM | POA: Diagnosis not present

## 2021-09-08 LAB — POCT INR: INR: 3.6 — AB (ref 2.0–3.0)

## 2021-09-08 NOTE — Patient Instructions (Addendum)
Pre visit review using our clinic review tool, if applicable. No additional management support is needed unless otherwise documented below in the visit note.  Only take 1/2 tablet (5mg ) today and then continue 1 tablet daily except take 1 1/2 tablets on Mondays and Thursdays.  Re-check in 1 week.

## 2021-09-08 NOTE — Progress Notes (Signed)
Only take 1/2 tablet (5mg ) today and then continue 1 tablet daily except take 1 1/2 tablets on Mondays and Thursdays.  Re-check in 1 week.

## 2021-09-10 ENCOUNTER — Other Ambulatory Visit: Payer: Self-pay | Admitting: Family Medicine

## 2021-09-15 ENCOUNTER — Ambulatory Visit (INDEPENDENT_AMBULATORY_CARE_PROVIDER_SITE_OTHER): Payer: Self-pay

## 2021-09-15 DIAGNOSIS — Z7901 Long term (current) use of anticoagulants: Secondary | ICD-10-CM

## 2021-09-15 LAB — POCT INR: INR: 2.7 (ref 2.0–3.0)

## 2021-09-15 NOTE — Progress Notes (Addendum)
Continue 1 tablet daily except take 1 1/2 tablets on Mondays and Thursdays.  Re-check in 1 week.   

## 2021-09-15 NOTE — Patient Instructions (Signed)
Pre visit review using our clinic review tool, if applicable. No additional management support is needed unless otherwise documented below in the visit note.  Continue 1 tablet daily except take 1 1/2 tablets on Mondays and Thursdays.  Re-check in 1 week. 

## 2021-09-23 ENCOUNTER — Ambulatory Visit (INDEPENDENT_AMBULATORY_CARE_PROVIDER_SITE_OTHER): Payer: Self-pay

## 2021-09-23 DIAGNOSIS — Z7901 Long term (current) use of anticoagulants: Secondary | ICD-10-CM

## 2021-09-23 LAB — POCT INR: INR: 2.8 (ref 2.0–3.0)

## 2021-09-23 NOTE — Patient Instructions (Addendum)
Pre visit review using our clinic review tool, if applicable. No additional management support is needed unless otherwise documented below in the visit note.  Continue 1 tablet daily except take 1 1/2 tablets on Mondays and Thursdays.  Re-check in 1 week. 

## 2021-09-23 NOTE — Progress Notes (Signed)
Continue 1 tablet daily except take 1 1/2 tablets on Mondays and Thursdays.  Re-check in 1 week.   

## 2021-09-29 ENCOUNTER — Ambulatory Visit (INDEPENDENT_AMBULATORY_CARE_PROVIDER_SITE_OTHER): Payer: Self-pay

## 2021-09-29 DIAGNOSIS — Z7901 Long term (current) use of anticoagulants: Secondary | ICD-10-CM

## 2021-09-29 LAB — POCT INR: INR: 3.2 — AB (ref 2.0–3.0)

## 2021-09-29 NOTE — Patient Instructions (Signed)
Pre visit review using our clinic review tool, if applicable. No additional management support is needed unless otherwise documented below in the visit note.  Continue 1 tablet daily except take 1 1/2 tablets on Mondays and Thursdays.  Re-check in 1 week. 

## 2021-09-29 NOTE — Progress Notes (Signed)
Continue 1 tablet daily except take 1 1/2 tablets on Mondays and Thursdays.  Re-check in 1 week.   

## 2021-10-06 LAB — POCT INR: INR: 3 (ref 2.0–3.0)

## 2021-10-07 ENCOUNTER — Ambulatory Visit (INDEPENDENT_AMBULATORY_CARE_PROVIDER_SITE_OTHER): Payer: Self-pay

## 2021-10-07 DIAGNOSIS — Z7901 Long term (current) use of anticoagulants: Secondary | ICD-10-CM

## 2021-10-07 NOTE — Progress Notes (Signed)
Continue 1 tablet daily except take 1 1/2 tablets on Mondays and Thursdays.  Re-check in 1 week.   

## 2021-10-07 NOTE — Patient Instructions (Addendum)
Pre visit review using our clinic review tool, if applicable. No additional management support is needed unless otherwise documented below in the visit note.  Continue 1 tablet daily except take 1 1/2 tablets on Mondays and Thursdays.  Re-check in 1 week. 

## 2021-10-13 LAB — POCT INR: INR: 2.8 (ref 2.0–3.0)

## 2021-10-14 ENCOUNTER — Ambulatory Visit (INDEPENDENT_AMBULATORY_CARE_PROVIDER_SITE_OTHER): Payer: Self-pay

## 2021-10-14 DIAGNOSIS — Z7901 Long term (current) use of anticoagulants: Secondary | ICD-10-CM

## 2021-10-14 NOTE — Patient Instructions (Addendum)
Pre visit review using our clinic review tool, if applicable. No additional management support is needed unless otherwise documented below in the visit note.  Continue 1 tablet daily except take 1 1/2 tablets on Mondays and Thursdays.  Re-check in 1 week. 

## 2021-10-14 NOTE — Progress Notes (Signed)
Continue 1 tablet daily except take 1 1/2 tablets on Mondays and Thursdays.  Re-check in 1 week. Dosing instructions left on patient's My Chart. Patient checks INR with MD INR 

## 2021-10-20 ENCOUNTER — Ambulatory Visit (INDEPENDENT_AMBULATORY_CARE_PROVIDER_SITE_OTHER): Payer: Self-pay

## 2021-10-20 DIAGNOSIS — Z7901 Long term (current) use of anticoagulants: Secondary | ICD-10-CM

## 2021-10-20 LAB — POCT INR: INR: 2.9 (ref 2.0–3.0)

## 2021-10-20 NOTE — Patient Instructions (Addendum)
Pre visit review using our clinic review tool, if applicable. No additional management support is needed unless otherwise documented below in the visit note.  Continue 1 tablet daily except take 1 1/2 tablets on Mondays and Thursdays.  Re-check in 1 week. 

## 2021-10-20 NOTE — Progress Notes (Signed)
Continue 1 tablet daily except take 1 1/2 tablets on Mondays and Thursdays.  Re-check in 1 week.   

## 2021-10-27 ENCOUNTER — Ambulatory Visit (INDEPENDENT_AMBULATORY_CARE_PROVIDER_SITE_OTHER): Payer: Self-pay

## 2021-10-27 DIAGNOSIS — Z7901 Long term (current) use of anticoagulants: Secondary | ICD-10-CM

## 2021-10-27 LAB — POCT INR: INR: 3 (ref 2.0–3.0)

## 2021-10-27 NOTE — Progress Notes (Signed)
Continue 1 tablet daily except take 1 1/2 tablets on Mondays and Thursdays.  Re-check in 1 week. Dosing instructions left on patient's My Chart. Patient checks INR with MD INR 

## 2021-10-27 NOTE — Patient Instructions (Addendum)
Pre visit review using our clinic review tool, if applicable. No additional management support is needed unless otherwise documented below in the visit note.  Continue 1 tablet daily except take 1 1/2 tablets on Mondays and Thursdays.  Re-check in 1 week. 

## 2021-11-03 DIAGNOSIS — Z7901 Long term (current) use of anticoagulants: Secondary | ICD-10-CM | POA: Diagnosis not present

## 2021-11-03 LAB — POCT INR: INR: 2.5 (ref 2.0–3.0)

## 2021-11-04 ENCOUNTER — Ambulatory Visit (INDEPENDENT_AMBULATORY_CARE_PROVIDER_SITE_OTHER): Payer: Self-pay

## 2021-11-04 DIAGNOSIS — Z7901 Long term (current) use of anticoagulants: Secondary | ICD-10-CM

## 2021-11-04 NOTE — Patient Instructions (Addendum)
Pre visit review using our clinic review tool, if applicable. No additional management support is needed unless otherwise documented below in the visit note.  Continue 1 tablet daily except take 1 1/2 tablets on Mondays and Thursdays.  Re-check in 1 week. 

## 2021-11-04 NOTE — Progress Notes (Signed)
Continue 1 tablet daily except take 1 1/2 tablets on Mondays and Thursdays.  Re-check in 1 week.   

## 2021-11-08 ENCOUNTER — Other Ambulatory Visit: Payer: Self-pay | Admitting: Family Medicine

## 2021-11-08 DIAGNOSIS — Z7901 Long term (current) use of anticoagulants: Secondary | ICD-10-CM

## 2021-11-10 ENCOUNTER — Ambulatory Visit (INDEPENDENT_AMBULATORY_CARE_PROVIDER_SITE_OTHER): Payer: Self-pay

## 2021-11-10 DIAGNOSIS — Z7901 Long term (current) use of anticoagulants: Secondary | ICD-10-CM

## 2021-11-10 LAB — POCT INR: INR: 2.5 (ref 2.0–3.0)

## 2021-11-10 NOTE — Progress Notes (Signed)
Continue 1 tablet daily except take 1 1/2 tablets on Mondays and Thursdays.  Re-check in 1 week.   

## 2021-11-10 NOTE — Telephone Encounter (Signed)
Pt is compliant with warfarin management and PCP apts. ?Sent in refill.  ?

## 2021-11-10 NOTE — Patient Instructions (Signed)
Pre visit review using our clinic review tool, if applicable. No additional management support is needed unless otherwise documented below in the visit note. 

## 2021-11-18 ENCOUNTER — Ambulatory Visit (INDEPENDENT_AMBULATORY_CARE_PROVIDER_SITE_OTHER): Payer: Self-pay

## 2021-11-18 DIAGNOSIS — Z7901 Long term (current) use of anticoagulants: Secondary | ICD-10-CM

## 2021-11-18 LAB — POCT INR: INR: 2.6 (ref 2.0–3.0)

## 2021-11-18 NOTE — Patient Instructions (Addendum)
Pre visit review using our clinic review tool, if applicable. No additional management support is needed unless otherwise documented below in the visit note.  Continue 1 tablet daily except take 1 1/2 tablets on Mondays and Thursdays.  Re-check in 1 week. 

## 2021-11-18 NOTE — Progress Notes (Signed)
Continue 1 tablet daily except take 1 1/2 tablets on Mondays and Thursdays.  Re-check in 1 week. Dosing instructions left on patient's My Chart. Patient checks INR with MD INR 

## 2021-11-24 ENCOUNTER — Ambulatory Visit (INDEPENDENT_AMBULATORY_CARE_PROVIDER_SITE_OTHER): Payer: Self-pay

## 2021-11-24 DIAGNOSIS — Z7901 Long term (current) use of anticoagulants: Secondary | ICD-10-CM

## 2021-11-24 LAB — POCT INR: INR: 2.8 (ref 2.0–3.0)

## 2021-11-24 NOTE — Progress Notes (Signed)
Continue 1 tablet daily except take 1 1/2 tablets on Mondays and Thursdays.  Re-check in 1 week. Dosing instructions left on patient's My Chart. Patient checks INR with MD INR 

## 2021-11-24 NOTE — Patient Instructions (Addendum)
Pre visit review using our clinic review tool, if applicable. No additional management support is needed unless otherwise documented below in the visit note.  Continue 1 tablet daily except take 1 1/2 tablets on Mondays and Thursdays.  Re-check in 1 week. 

## 2021-12-01 ENCOUNTER — Ambulatory Visit (INDEPENDENT_AMBULATORY_CARE_PROVIDER_SITE_OTHER): Payer: Self-pay

## 2021-12-01 DIAGNOSIS — Z7901 Long term (current) use of anticoagulants: Secondary | ICD-10-CM

## 2021-12-01 LAB — POCT INR: INR: 2.7 (ref 2.0–3.0)

## 2021-12-01 NOTE — Patient Instructions (Addendum)
Pre visit review using our clinic review tool, if applicable. No additional management support is needed unless otherwise documented below in the visit note.  Continue 1 tablet daily except take 1 1/2 tablets on Mondays and Thursdays.  Re-check in 1 week. 

## 2021-12-01 NOTE — Progress Notes (Signed)
Continue 1 tablet daily except take 1 1/2 tablets on Mondays and Thursdays.  Re-check in 1 week. Dosing instructions left on patient's My Chart. Patient checks INR with MD INR 

## 2021-12-08 ENCOUNTER — Ambulatory Visit (INDEPENDENT_AMBULATORY_CARE_PROVIDER_SITE_OTHER): Payer: Self-pay

## 2021-12-08 ENCOUNTER — Other Ambulatory Visit: Payer: Self-pay | Admitting: Family Medicine

## 2021-12-08 DIAGNOSIS — Z7901 Long term (current) use of anticoagulants: Secondary | ICD-10-CM

## 2021-12-08 LAB — POCT INR: INR: 2.8 (ref 2.0–3.0)

## 2021-12-08 NOTE — Patient Instructions (Addendum)
Pre visit review using our clinic review tool, if applicable. No additional management support is needed unless otherwise documented below in the visit note.  Continue 1 tablet daily except take 1 1/2 tablets on Mondays and Thursdays.  Re-check in 1 week. 

## 2021-12-08 NOTE — Progress Notes (Signed)
Continue 1 tablet daily except take 1 1/2 tablets on Mondays and Thursdays.  Re-check in 1 week. Dosing instructions left on patient's My Chart. Patient checks INR with MD INR 

## 2021-12-14 ENCOUNTER — Telehealth: Payer: Self-pay | Admitting: Family Medicine

## 2021-12-14 ENCOUNTER — Ambulatory Visit: Payer: BC Managed Care – PPO | Admitting: Family Medicine

## 2021-12-14 ENCOUNTER — Encounter: Payer: Self-pay | Admitting: Family Medicine

## 2021-12-14 VITALS — BP 110/70 | HR 58 | Temp 97.5°F | Ht 67.0 in | Wt 174.5 lb

## 2021-12-14 DIAGNOSIS — Z9861 Coronary angioplasty status: Secondary | ICD-10-CM | POA: Diagnosis not present

## 2021-12-14 DIAGNOSIS — E781 Pure hyperglyceridemia: Secondary | ICD-10-CM | POA: Diagnosis not present

## 2021-12-14 DIAGNOSIS — I2510 Atherosclerotic heart disease of native coronary artery without angina pectoris: Secondary | ICD-10-CM | POA: Diagnosis not present

## 2021-12-14 DIAGNOSIS — Z862 Personal history of diseases of the blood and blood-forming organs and certain disorders involving the immune mechanism: Secondary | ICD-10-CM | POA: Diagnosis not present

## 2021-12-14 DIAGNOSIS — E1165 Type 2 diabetes mellitus with hyperglycemia: Secondary | ICD-10-CM | POA: Diagnosis not present

## 2021-12-14 LAB — COMPREHENSIVE METABOLIC PANEL
ALT: 20 U/L (ref 0–53)
AST: 21 U/L (ref 0–37)
Albumin: 5 g/dL (ref 3.5–5.2)
Alkaline Phosphatase: 31 U/L — ABNORMAL LOW (ref 39–117)
BUN: 19 mg/dL (ref 6–23)
CO2: 27 mEq/L (ref 19–32)
Calcium: 10.2 mg/dL (ref 8.4–10.5)
Chloride: 104 mEq/L (ref 96–112)
Creatinine, Ser: 0.91 mg/dL (ref 0.40–1.50)
GFR: 91.82 mL/min (ref >60.00)
Glucose, Bld: 136 mg/dL — ABNORMAL HIGH (ref 70–99)
Potassium: 5.1 mEq/L (ref 3.5–5.1)
Sodium: 139 mEq/L (ref 135–145)
Total Bilirubin: 1 mg/dL (ref 0.2–1.2)
Total Protein: 7.4 g/dL (ref 6.0–8.3)

## 2021-12-14 LAB — LIPID PANEL
Cholesterol: 165 mg/dL (ref 0–200)
HDL: 36.9 mg/dL — ABNORMAL LOW (ref >39.00)
NonHDL: 127.63
Total CHOL/HDL Ratio: 4
Triglycerides: 329 mg/dL — ABNORMAL HIGH (ref 0.0–149.0)
VLDL: 65.8 mg/dL — ABNORMAL HIGH (ref 0.0–40.0)

## 2021-12-14 LAB — POCT GLYCOSYLATED HEMOGLOBIN (HGB A1C): Hemoglobin A1C: 7 % — AB (ref 4.0–5.6)

## 2021-12-14 LAB — LDL CHOLESTEROL, DIRECT: Direct LDL: 87 mg/dL

## 2021-12-14 NOTE — Progress Notes (Signed)
? ?Established Patient Office Visit ? ?Subjective:  ?Patient ID: Dennis Bates, male    DOB: 06/14/1961  Age: 60 y.o. MRN: [id number] ? ?CC:  ?Chief Complaint  ?Patient presents with  ? Follow-up  ? ? ?HPI ?Dennis Bates presents for medical follow-up.  He has history of CAD, type 2 diabetes, hyperlipidemia.  Last A1c was 7.3%.  This has been steadily coming down from 8.7% a year ago.  He elected diet and lifestyle change as opposed to additional medications after last visit.  A1c today is slightly improved to 7.0%.  He remains on Jardiance and metformin. ? ?Hyperlipidemia.  Over a year since last lipids were checked.  Is on rosuvastatin 20 mg daily and gemfibrozil 600 mg twice daily.  No myalgias.  He is on chronic Coumadin.  He monitors his INR's at home and transmits them here to our Coumadin nurse.  These have been stable. ? ?Past Medical History:  ?Diagnosis Date  ? Aortic stenosis due to bicuspid aortic valve 09/2014  ? Severe aortic stenosis of bicuspid valve - s/pAVR with Bentall ( 09/2015)  ? Bell's palsy   ? LAST EPISODE 09-2015  ? CAD S/P percutaneous coronary angioplasty 09/20/2014  ? a. inferolat STEMI ->> LHC-Angio: Proximal/ostial LAD 20%, OM2 99% -->> PCI: 49mm x 16 mm Promus Premier DES to the OM2.(~3.5 mm)  ? Diabetes mellitus type 2, controlled, with complications ([place])   ? History of mechanical aortic valve replacement 10/09/2015  ? after 1 yr DAPT for Inferolateral STEMI. (Dr. Cyndia Bent)  ? Hyperlipidemia   ? Hypertension   ? Hypertriglyceridemia   ? Shoulder arthritis 07/11/2020  ? Rt Severe GH DJD xray Sep 2021  ? ST elevation myocardial infarction (STEMI) of inferior wall (HCC) 09/20/2014  ? 99 % occluded Cx-OM2 Promus DES 3.0 mm x 16 mm (3.5 mm)  ? ? ?Past Surgical History:  ?Procedure Laterality Date  ? BENTALL PROCEDURE N/A 10/09/2015  ? Procedure: BENTALL PROCEDURE (using a St Jude mechanical valve, size 23);  Surgeon: Gaye Pollack, MD;  Location: [place];  Service: Open Heart Surgery;   Laterality: N/A;  CIRC ARRESTNEEDS RIGHT RADIAL A-LINE  ? COLONOSCOPY WITH PROPOFOL N/A 01/21/2020  ? Procedure: COLONOSCOPY WITH PROPOFOL;  Surgeon: Jackquline Denmark, MD;  Location: [facility] ENDOSCOPY;  Service: Endoscopy;  Laterality: N/A;  ? ENTEROSCOPY N/A 01/24/2020  ? Procedure: ENTEROSCOPY;  Surgeon: Doran Stabler, MD;  Location: [place];  Service: Gastroenterology;  Laterality: N/A;  ? ESOPHAGOGASTRODUODENOSCOPY (EGD) WITH PROPOFOL N/A 01/21/2020  ? Procedure: ESOPHAGOGASTRODUODENOSCOPY (EGD) WITH PROPOFOL;  Surgeon: Jackquline Denmark, MD;  Location: [facility] ENDOSCOPY;  Service: Endoscopy;  Laterality: N/A;  ? GIVENS CAPSULE STUDY N/A 01/22/2020  ? Procedure: GIVENS CAPSULE STUDY;  Surgeon: Doran Stabler, MD;  Location: [place];  Service: Gastroenterology;  Laterality: N/A;  ? KNEE ARTHROSCOPY W/ ACL RECONSTRUCTION Right   ? 90's; "2 ATHROSCOPIC , 2 REBUILDS"  ? LEFT HEART CATHETERIZATION WITH CORONARY ANGIOGRAM N/A 09/20/2014  ? Procedure: LEFT HEART CATHETERIZATION WITH CORONARY ANGIOGRAM;  Surgeon: Leonie Man, MD;  Location: [facility] CATH LAB;  Proximal/ostial LAD 20%, OM2 99% -->> aspiration thrombectomy and DES PCI:   ? PERCUTANEOUS CORONARY STENT INTERVENTION (PCI-S)  09/20/2014  ? Aspiration thrombectomy and DES PCI- OM 2(m-dCx): Promus Premier DES 3.0 mm x 16 mm (3.5 mm)  ? RIGHT/LEFT HEART CATH AND CORONARY ANGIOGRAPHY  09/2015  ? Dr. Shelva Majestic: Widely patent OM 2 stent.  Mild disease in RCA and LAD.Relatively normal  right heart cath pressures.  Normal cardiac output.  ? TEE WITHOUT CARDIOVERSION N/A 10/09/2015  ? Procedure: TRANSESOPHAGEAL ECHOCARDIOGRAM (TEE);  Surgeon: Alleen Borne, MD;  Location: [facility] OR;  Service: Open Heart Surgery;  Laterality: N/A;  ? TOTAL KNEE ARTHROPLASTY Right 12/09/2017  ? Procedure: RIGHT TOTAL KNEE ARTHROPLASTY REMOVAL OF HARDWARE RIGHT KNEE;  Surgeon: Jodi Geralds, MD;  Location: [facility] ORS;  Service: Orthopedics;  Laterality: Right;  ? TRANSTHORACIC ECHOCARDIOGRAM  02/2016   ? Post AVR: mechanical: AVR without obstuction. LVEF improved to 65-70%.  ? TRANSTHORACIC ECHOCARDIOGRAM  09/2014, 10/2014  ? Probable bicuspid aortic valve: a. mod-sev by 2D ECHO 09/2014. AVA 0.9cm^2; b) 10/2014 Echo:. Severe AS Mn-Pk Gradient 41-71 mmHg, AVA ~0.79 cm2, EF 60-65%  ? TRANSTHORACIC ECHOCARDIOGRAM  12/2019  ?  Normal WM. Gr 2 DD. Mild LA dilation. 23 mm St. Jude Mechanical AoV well seated.  Trivial AI.   ? ? ?Family History  ?Problem Relation Age of Onset  ? Heart disease Mother   ? CAD Neg Hx   ?     per wife  ? ? ?Social History  ? ?Socioeconomic History  ? Marital status: Married  ?  Spouse name: Not on file  ? Number of children: Not on file  ? Years of education: Not on file  ? Highest education level: Not on file  ?Occupational History  ? Not on file  ?Tobacco Use  ? Smoking status: Never  ? Smokeless tobacco: Former  ?  Types: Chew  ?  Quit date: 09/01/1985  ?Vaping Use  ? Vaping Use: Never used  ?Substance and Sexual Activity  ? Alcohol use: Yes  ?  Alcohol/week: 0.0 standard drinks  ?  Comment: socially  ? Drug use: No  ? Sexual activity: Not on file  ?Other Topics Concern  ? Not on file  ?Social History Narrative  ? Not on file  ? ?Social Determinants of Health  ? ?Financial Resource Strain: Not on file  ?Food Insecurity: Not on file  ?Transportation Needs: Not on file  ?Physical Activity: Not on file  ?Stress: Not on file  ?Social Connections: Not on file  ?Intimate Partner Violence: Not on file  ? ? ?Outpatient Medications Prior to Visit  ?Medication Sig Dispense Refill  ? acetaminophen (TYLENOL) 325 MG tablet Take 2 tablets (650 mg total) by mouth every 6 (six) hours as needed for mild pain.    ? albuterol (VENTOLIN HFA) 108 (90 Base) MCG/ACT inhaler Inhale 2 puffs into the lungs every 6 (six) hours as needed for wheezing or shortness of breath. 8 g 0  ? empagliflozin (JARDIANCE) 25 MG TABS tablet Take 1 tablet (25 mg total) by mouth daily before breakfast. 30 tablet 11  ? gemfibrozil  (LOPID) 600 MG tablet TAKE 1 TABLET (600 MG TOTAL) BY MOUTH IN THE MORNING AND AT BEDTIME. 180 tablet 0  ? metFORMIN (GLUCOPHAGE) 1000 MG tablet Take 1 tablet (1,000 mg total) by mouth 2 (two) times daily with a meal. 60 tablet 11  ? rosuvastatin (CRESTOR) 20 MG tablet TAKE 1 TABLET BY MOUTH EVERYDAY AT BEDTIME 90 tablet 0  ? tadalafil (CIALIS) 20 MG tablet Take 1 tablet every other day as needed for erectile dysfunction 6 tablet 11  ? warfarin (COUMADIN) 10 MG tablet TAKE 1 TABLET DAILY EXCEPT TAKE 1 1/2 ON MONDAYS AND THURSDAYS OR TAKE AS DIRECTED BY ANTICOAGULATION CLINIC 100 tablet 1  ? carvedilol (COREG) 3.125 MG tablet Take 1 tablet (3.125 mg total) by  mouth 2 (two) times daily. 60 tablet 11  ? ?No facility-administered medications prior to visit.  ? ? ?No Known Allergies ? ?ROS ?Review of Systems  ?Constitutional:  Negative for fatigue and unexpected weight change.  ?Eyes:  Negative for visual disturbance.  ?Respiratory:  Negative for cough, chest tightness and shortness of breath.   ?Cardiovascular:  Negative for chest pain, palpitations and leg swelling.  ?Endocrine: Negative for polydipsia and polyuria.  ?Neurological:  Negative for dizziness, syncope, weakness, light-headedness and headaches.  ? ?  ?Objective:  ?  ?Physical Exam ?Constitutional:   ?   Appearance: He is well-developed.  ?Neck:  ?   Thyroid: No thyromegaly.  ?Cardiovascular:  ?   Rate and Rhythm: Normal rate.  ?Pulmonary:  ?   Effort: Pulmonary effort is normal. No respiratory distress.  ?   Breath sounds: Normal breath sounds. No wheezing or rales.  ?Musculoskeletal:  ?   Cervical back: Neck supple.  ?   Right lower leg: No edema.  ?   Left lower leg: No edema.  ?Neurological:  ?   Mental Status: He is alert and oriented to person, place, and time.  ? ? ?BP 110/70 (BP Location: Left Arm, Patient Position: Sitting, Cuff Size: Normal)   Pulse (!) 58   Temp (!) 97.5 ?F (36.4 ?C) (Oral)   Ht 5\' 7"  (1.702 m)   Wt 174 lb 8 oz (79.2 kg)    SpO2 98%   BMI 27.33 kg/m?  ?Wt Readings from Last 3 Encounters:  ?12/14/21 174 lb 8 oz (79.2 kg)  ?08/24/21 172 lb 4.8 oz (78.2 kg)  ?05/13/21 174 lb 11.2 oz (79.2 kg)  ? ? ? ?Health Maintenance Due  ?Topic Date Due

## 2021-12-14 NOTE — Telephone Encounter (Signed)
Lab add on has been faxed ?

## 2021-12-14 NOTE — Telephone Encounter (Signed)
Patient had labs done today--he stated the lab took and extra vial of blood to get his hemoglobin checked.  Last year he was having a hard time with losing blood so he wanted to get his Hemoglobin checked as a check up on that issue.  He will need lab orders added for this today. ?

## 2021-12-15 ENCOUNTER — Ambulatory Visit (INDEPENDENT_AMBULATORY_CARE_PROVIDER_SITE_OTHER): Payer: BC Managed Care – PPO

## 2021-12-15 DIAGNOSIS — Z7901 Long term (current) use of anticoagulants: Secondary | ICD-10-CM

## 2021-12-15 LAB — POCT INR: INR: 2.6 (ref 2.0–3.0)

## 2021-12-15 NOTE — Addendum Note (Signed)
Addended by: Nilda Riggs on: 12/15/2021 10:58 AM ? ? Modules accepted: Orders ? ?

## 2021-12-15 NOTE — Progress Notes (Signed)
Continue 1 tablet daily except take 1 1/2 tablets on Mondays and Thursdays.  Re-check in 1 week.   

## 2021-12-15 NOTE — Progress Notes (Signed)
Patient ID: Dennis Bates, male   DOB: 07-26-1961, 62 y.o.   MRN: [id number] ? ?Medical screening examination/treatment/procedure(s) were performed by non-physician practitioner and as supervising physician I was immediately available for consultation/collaboration.  I agree with above. Oliver Barre, MD ? ?

## 2021-12-15 NOTE — Patient Instructions (Addendum)
Pre visit review using our clinic review tool, if applicable. No additional management support is needed unless otherwise documented below in the visit note.  Continue 1 tablet daily except take 1 1/2 tablets on Mondays and Thursdays.  Re-check in 1 week. 

## 2021-12-22 ENCOUNTER — Encounter: Payer: Self-pay | Admitting: Family Medicine

## 2021-12-22 ENCOUNTER — Ambulatory Visit (INDEPENDENT_AMBULATORY_CARE_PROVIDER_SITE_OTHER): Payer: BC Managed Care – PPO

## 2021-12-22 ENCOUNTER — Other Ambulatory Visit: Payer: Self-pay

## 2021-12-22 ENCOUNTER — Other Ambulatory Visit (INDEPENDENT_AMBULATORY_CARE_PROVIDER_SITE_OTHER): Payer: BC Managed Care – PPO

## 2021-12-22 DIAGNOSIS — Z7901 Long term (current) use of anticoagulants: Secondary | ICD-10-CM | POA: Diagnosis not present

## 2021-12-22 DIAGNOSIS — Z862 Personal history of diseases of the blood and blood-forming organs and certain disorders involving the immune mechanism: Secondary | ICD-10-CM

## 2021-12-22 DIAGNOSIS — E1165 Type 2 diabetes mellitus with hyperglycemia: Secondary | ICD-10-CM

## 2021-12-22 LAB — CBC WITH DIFFERENTIAL/PLATELET
Basophils Absolute: 0 10*3/uL (ref 0.0–0.1)
Basophils Relative: 0.7 % (ref 0.0–3.0)
Eosinophils Absolute: 0.1 10*3/uL (ref 0.0–0.7)
Eosinophils Relative: 1.6 % (ref 0.0–5.0)
HCT: 46.3 % (ref 39.0–52.0)
Hemoglobin: 16.1 g/dL (ref 13.0–17.0)
Lymphocytes Relative: 18.4 % (ref 12.0–46.0)
Lymphs Abs: 1.2 10*3/uL (ref 0.7–4.0)
MCHC: 34.7 g/dL (ref 30.0–36.0)
MCV: 85.4 fl (ref 78.0–100.0)
Monocytes Absolute: 0.5 10*3/uL (ref 0.1–1.0)
Monocytes Relative: 7.2 % (ref 3.0–12.0)
Neutro Abs: 4.5 10*3/uL (ref 1.4–7.7)
Neutrophils Relative %: 72.1 % (ref 43.0–77.0)
Platelets: 268 10*3/uL (ref 150.0–400.0)
RBC: 5.43 Mil/uL (ref 4.22–5.81)
RDW: 13.9 % (ref 11.5–15.5)
WBC: 6.3 10*3/uL (ref 4.0–10.5)

## 2021-12-22 LAB — HEMOGLOBIN A1C: Hgb A1c MFr Bld: 7.2 % — ABNORMAL HIGH (ref 4.6–6.5)

## 2021-12-22 LAB — POCT INR: INR: 3 (ref 2.0–3.0)

## 2021-12-22 NOTE — Progress Notes (Signed)
Continue 1 tablet daily except take 1 1/2 tablets on Mondays and Thursdays.  Re-check in 1 week. Dosing instructions left on patient's My Chart. Patient checks INR with MD INR 

## 2021-12-22 NOTE — Patient Instructions (Addendum)
Pre visit review using our clinic review tool, if applicable. No additional management support is needed unless otherwise documented below in the visit note.  Continue 1 tablet daily except take 1 1/2 tablets on Mondays and Thursdays.  Re-check in 1 week. 

## 2021-12-22 NOTE — Addendum Note (Signed)
Addended by: Elita Boone E on: 12/22/2021 01:27 PM ? ? Modules accepted: Orders ? ?

## 2021-12-29 ENCOUNTER — Ambulatory Visit (INDEPENDENT_AMBULATORY_CARE_PROVIDER_SITE_OTHER): Payer: BC Managed Care – PPO

## 2021-12-29 DIAGNOSIS — Z7901 Long term (current) use of anticoagulants: Secondary | ICD-10-CM | POA: Diagnosis not present

## 2021-12-29 LAB — POCT INR: INR: 3.1 — AB (ref 2.0–3.0)

## 2021-12-29 NOTE — Patient Instructions (Addendum)
Pre visit review using our clinic review tool, if applicable. No additional management support is needed unless otherwise documented below in the visit note.  Continue 1 tablet daily except take 1 1/2 tablets on Mondays and Thursdays.  Re-check in 1 week. 

## 2021-12-29 NOTE — Progress Notes (Signed)
Continue 1 tablet daily except take 1 1/2 tablets on Mondays and Thursdays.  Re-check in 1 week.   

## 2022-01-05 ENCOUNTER — Ambulatory Visit (INDEPENDENT_AMBULATORY_CARE_PROVIDER_SITE_OTHER): Payer: BC Managed Care – PPO

## 2022-01-05 DIAGNOSIS — Z7901 Long term (current) use of anticoagulants: Secondary | ICD-10-CM

## 2022-01-05 LAB — POCT INR: INR: 1.9 — AB (ref 2.0–3.0)

## 2022-01-05 NOTE — Patient Instructions (Addendum)
Pre visit review using our clinic review tool, if applicable. No additional management support is needed unless otherwise documented below in the visit note.  Increase dose today to take 1 1/2 tablets and increase dose tomorrow to take 1 1/2 tablets and then continue 1 tablet daily except take 1 1/2 tablets on Mondays and Thursdays.  Re-check in 1 week. 

## 2022-01-05 NOTE — Progress Notes (Signed)
Increase dose today to take 1 1/2 tablets and increase dose tomorrow to take 1 1/2 tablets and then continue 1 tablet daily except take 1 1/2 tablets on Mondays and Thursdays.  Re-check in 1 week. ?Dosing instructions left on patient's My Chart. ?Patient checks INR with MD INR ?

## 2022-01-09 ENCOUNTER — Other Ambulatory Visit: Payer: Self-pay | Admitting: Family Medicine

## 2022-01-09 DIAGNOSIS — Z7901 Long term (current) use of anticoagulants: Secondary | ICD-10-CM

## 2022-01-11 NOTE — Telephone Encounter (Signed)
Pt is compliant with warfarin management and PCP apts. ?Sent in refill.  ?

## 2022-01-12 ENCOUNTER — Ambulatory Visit (INDEPENDENT_AMBULATORY_CARE_PROVIDER_SITE_OTHER): Payer: BC Managed Care – PPO

## 2022-01-12 DIAGNOSIS — Z7901 Long term (current) use of anticoagulants: Secondary | ICD-10-CM

## 2022-01-12 LAB — POCT INR: INR: 2.8 (ref 2.0–3.0)

## 2022-01-12 NOTE — Progress Notes (Signed)
Continue 1 tablet daily except take 1 1/2 tablets on Mondays and Thursdays.  Re-check in 1 week.   

## 2022-01-12 NOTE — Patient Instructions (Addendum)
Pre visit review using our clinic review tool, if applicable. No additional management support is needed unless otherwise documented below in the visit note.  Continue 1 tablet daily except take 1 1/2 tablets on Mondays and Thursdays.  Re-check in 1 week. 

## 2022-01-13 ENCOUNTER — Other Ambulatory Visit: Payer: Self-pay

## 2022-01-13 MED ORDER — EMPAGLIFLOZIN 25 MG PO TABS: 25.0000 mg | ORAL_TABLET | Freq: Every day | ORAL | 3 refills | Status: DC

## 2022-01-19 LAB — POCT INR: INR: 2.7 (ref 2.0–3.0)

## 2022-01-20 ENCOUNTER — Ambulatory Visit (INDEPENDENT_AMBULATORY_CARE_PROVIDER_SITE_OTHER): Payer: BC Managed Care – PPO

## 2022-01-20 DIAGNOSIS — Z7901 Long term (current) use of anticoagulants: Secondary | ICD-10-CM

## 2022-01-20 NOTE — Progress Notes (Signed)
Continue 1 tablet daily except take 1 1/2 tablets on Mondays and Thursdays.  Re-check in 1 week. Dosing instructions left on patient's My Chart. Patient checks INR with MD INR 

## 2022-01-20 NOTE — Patient Instructions (Addendum)
Pre visit review using our clinic review tool, if applicable. No additional management support is needed unless otherwise documented below in the visit note.  Continue 1 tablet daily except take 1 1/2 tablets on Mondays and Thursdays.  Re-check in 1 week. 

## 2022-01-25 ENCOUNTER — Other Ambulatory Visit: Payer: Self-pay

## 2022-01-25 DIAGNOSIS — Z7901 Long term (current) use of anticoagulants: Secondary | ICD-10-CM

## 2022-01-25 MED ORDER — GEMFIBROZIL 600 MG PO TABS: 600.0000 mg | ORAL_TABLET | Freq: Two times a day (BID) | ORAL | 3 refills | Status: DC

## 2022-01-25 MED ORDER — WARFARIN SODIUM 10 MG PO TABS: ORAL_TABLET | ORAL | 1 refills | Status: DC

## 2022-01-25 MED ORDER — CARVEDILOL 3.125 MG PO TABS: 3.1250 mg | ORAL_TABLET | Freq: Two times a day (BID) | ORAL | 3 refills | Status: DC

## 2022-01-25 MED ORDER — METFORMIN HCL 1000 MG PO TABS: 1000.0000 mg | ORAL_TABLET | Freq: Two times a day (BID) | ORAL | 3 refills | Status: DC

## 2022-01-25 MED ORDER — ROSUVASTATIN CALCIUM 20 MG PO TABS: ORAL_TABLET | ORAL | 3 refills | Status: DC

## 2022-01-25 NOTE — Telephone Encounter (Signed)
Pt is compliant with warfarin management and PCP apts. ?Sent in refill.  ?

## 2022-01-26 ENCOUNTER — Ambulatory Visit (INDEPENDENT_AMBULATORY_CARE_PROVIDER_SITE_OTHER): Payer: BC Managed Care – PPO

## 2022-01-26 DIAGNOSIS — Z7901 Long term (current) use of anticoagulants: Secondary | ICD-10-CM | POA: Diagnosis not present

## 2022-01-26 LAB — POCT INR: INR: 2.5 (ref 2.0–3.0)

## 2022-01-26 NOTE — Progress Notes (Signed)
Continue 1 tablet daily except take 1 1/2 tablets on Mondays and Thursdays.  Re-check in 1 week. Dosing instructions left on patient's My Chart. Patient checks INR with MD INR 

## 2022-01-26 NOTE — Patient Instructions (Addendum)
Pre visit review using our clinic review tool, if applicable. No additional management support is needed unless otherwise documented below in the visit note.  Continue 1 tablet daily except take 1 1/2 tablets on Mondays and Thursdays.  Re-check in 1 week. 

## 2022-02-03 ENCOUNTER — Ambulatory Visit (INDEPENDENT_AMBULATORY_CARE_PROVIDER_SITE_OTHER): Payer: BC Managed Care – PPO

## 2022-02-03 DIAGNOSIS — Z7901 Long term (current) use of anticoagulants: Secondary | ICD-10-CM | POA: Diagnosis not present

## 2022-02-03 LAB — POCT INR: INR: 2.6 (ref 2.0–3.0)

## 2022-02-03 NOTE — Progress Notes (Signed)
Continue 1 tablet daily except take 1 1/2 tablets on Mondays and Thursdays.  Re-check in 1 week. Dosing instructions left on patient's My Chart. Patient checks INR with MD INR 

## 2022-02-03 NOTE — Patient Instructions (Addendum)
Pre visit review using our clinic review tool, if applicable. No additional management support is needed unless otherwise documented below in the visit note.  Continue 1 tablet daily except take 1 1/2 tablets on Mondays and Thursdays.  Re-check in 1 week. 

## 2022-02-07 ENCOUNTER — Other Ambulatory Visit: Payer: Self-pay | Admitting: Family Medicine

## 2022-02-08 ENCOUNTER — Other Ambulatory Visit: Payer: Self-pay | Admitting: Family Medicine

## 2022-02-09 LAB — POCT INR: INR: 2.5 (ref 2.0–3.0)

## 2022-02-10 ENCOUNTER — Ambulatory Visit (INDEPENDENT_AMBULATORY_CARE_PROVIDER_SITE_OTHER): Payer: BC Managed Care – PPO

## 2022-02-10 DIAGNOSIS — Z7901 Long term (current) use of anticoagulants: Secondary | ICD-10-CM | POA: Diagnosis not present

## 2022-02-10 NOTE — Progress Notes (Signed)
Continue 1 tablet daily except take 1 1/2 tablets on Mondays and Thursdays.  Re-check in 1 week. Dosing instructions left on patient's My Chart. Patient checks INR with MD INR 

## 2022-02-10 NOTE — Patient Instructions (Addendum)
Pre visit review using our clinic review tool, if applicable. No additional management support is needed unless otherwise documented below in the visit note.  Continue 1 tablet daily except take 1 1/2 tablets on Mondays and Thursdays.  Re-check in 1 week. 

## 2022-02-16 ENCOUNTER — Ambulatory Visit (INDEPENDENT_AMBULATORY_CARE_PROVIDER_SITE_OTHER): Payer: BC Managed Care – PPO

## 2022-02-16 DIAGNOSIS — Z7901 Long term (current) use of anticoagulants: Secondary | ICD-10-CM | POA: Diagnosis not present

## 2022-02-16 LAB — POCT INR: INR: 2.8 (ref 2.0–3.0)

## 2022-02-16 NOTE — Patient Instructions (Addendum)
Pre visit review using our clinic review tool, if applicable. No additional management support is needed unless otherwise documented below in the visit note.  Continue 1 tablet daily except take 1 1/2 tablets on Mondays and Thursdays.  Re-check in 1 week. Dosing instructions left on patient's My Chart. Patient checks INR with MD INR

## 2022-02-16 NOTE — Progress Notes (Signed)
Continue 1 tablet daily except take 1 1/2 tablets on Mondays and Thursdays.  Re-check in 1 week. Dosing instructions left on patient's My Chart. Patient checks INR with MD INR 

## 2022-02-19 ENCOUNTER — Other Ambulatory Visit: Payer: Self-pay | Admitting: Family Medicine

## 2022-02-25 ENCOUNTER — Ambulatory Visit (INDEPENDENT_AMBULATORY_CARE_PROVIDER_SITE_OTHER): Payer: BC Managed Care – PPO

## 2022-02-25 DIAGNOSIS — Z7901 Long term (current) use of anticoagulants: Secondary | ICD-10-CM | POA: Diagnosis not present

## 2022-02-25 LAB — POCT INR: INR: 2.9 (ref 2.0–3.0)

## 2022-02-25 NOTE — Progress Notes (Signed)
Continue 1 tablet daily except take 1 1/2 tablets on Mondays and Thursdays.  Re-check in 1 week. Dosing instructions left on patient's My Chart. Patient checks INR with MD INR 

## 2022-02-25 NOTE — Patient Instructions (Signed)
Pre visit review using our clinic review tool, if applicable. No additional management support is needed unless otherwise documented below in the visit note. 

## 2022-03-02 ENCOUNTER — Ambulatory Visit (INDEPENDENT_AMBULATORY_CARE_PROVIDER_SITE_OTHER): Payer: BC Managed Care – PPO

## 2022-03-02 DIAGNOSIS — Z7901 Long term (current) use of anticoagulants: Secondary | ICD-10-CM

## 2022-03-02 LAB — POCT INR: INR: 2.8 (ref 2.0–3.0)

## 2022-03-02 NOTE — Patient Instructions (Addendum)
Pre visit review using our clinic review tool, if applicable. No additional management support is needed unless otherwise documented below in the visit note.  Continue 1 tablet daily except take 1 1/2 tablets on Mondays and Thursdays.  Re-check in 1 week.

## 2022-03-02 NOTE — Progress Notes (Signed)
Continue 1 tablet daily except take 1 1/2 tablets on Mondays and Thursdays.  Re-check in 1 week. Dosing instructions left on patient's My Chart. Patient checks INR with MD INR 

## 2022-03-11 LAB — POCT INR: INR: 3.2 — AB (ref 2.0–3.0)

## 2022-03-18 ENCOUNTER — Ambulatory Visit (INDEPENDENT_AMBULATORY_CARE_PROVIDER_SITE_OTHER): Payer: BC Managed Care – PPO

## 2022-03-18 ENCOUNTER — Other Ambulatory Visit: Payer: Self-pay

## 2022-03-18 ENCOUNTER — Emergency Department (HOSPITAL_COMMUNITY)
Admission: EM | Admit: 2022-03-18 | Discharge: 2022-03-19 | Disposition: A | Discharge status: Home / Self Care | Payer: BC Managed Care – PPO | Attending: Emergency Medicine | Admitting: Emergency Medicine

## 2022-03-18 ENCOUNTER — Emergency Department (HOSPITAL_COMMUNITY): Payer: BC Managed Care – PPO

## 2022-03-18 ENCOUNTER — Encounter (HOSPITAL_COMMUNITY): Payer: Self-pay | Admitting: Emergency Medicine

## 2022-03-18 DIAGNOSIS — Z7901 Long term (current) use of anticoagulants: Secondary | ICD-10-CM

## 2022-03-18 DIAGNOSIS — K8050 Calculus of bile duct without cholangitis or cholecystitis without obstruction: Secondary | ICD-10-CM | POA: Diagnosis not present

## 2022-03-18 DIAGNOSIS — D72829 Elevated white blood cell count, unspecified: Secondary | ICD-10-CM | POA: Diagnosis not present

## 2022-03-18 DIAGNOSIS — I10 Essential (primary) hypertension: Secondary | ICD-10-CM | POA: Diagnosis not present

## 2022-03-18 DIAGNOSIS — R079 Chest pain, unspecified: Secondary | ICD-10-CM | POA: Diagnosis not present

## 2022-03-18 DIAGNOSIS — R7989 Other specified abnormal findings of blood chemistry: Secondary | ICD-10-CM | POA: Insufficient documentation

## 2022-03-18 DIAGNOSIS — Q230 Congenital stenosis of aortic valve: Secondary | ICD-10-CM

## 2022-03-18 DIAGNOSIS — R1013 Epigastric pain: Secondary | ICD-10-CM | POA: Diagnosis not present

## 2022-03-18 DIAGNOSIS — R1084 Generalized abdominal pain: Secondary | ICD-10-CM | POA: Diagnosis not present

## 2022-03-18 DIAGNOSIS — K8070 Calculus of gallbladder and bile duct without cholecystitis without obstruction: Secondary | ICD-10-CM | POA: Diagnosis not present

## 2022-03-18 DIAGNOSIS — Q231 Congenital insufficiency of aortic valve: Secondary | ICD-10-CM

## 2022-03-18 DIAGNOSIS — I2510 Atherosclerotic heart disease of native coronary artery without angina pectoris: Secondary | ICD-10-CM | POA: Diagnosis not present

## 2022-03-18 DIAGNOSIS — K8020 Calculus of gallbladder without cholecystitis without obstruction: Secondary | ICD-10-CM

## 2022-03-18 LAB — COMPREHENSIVE METABOLIC PANEL
ALT: 173 U/L — ABNORMAL HIGH (ref 0–44)
AST: 318 U/L — ABNORMAL HIGH (ref 15–41)
Albumin: 4.1 g/dL (ref 3.5–5.0)
Alkaline Phosphatase: 46 U/L (ref 38–126)
Anion gap: 14 (ref 5–15)
BUN: 12 mg/dL (ref 6–20)
CO2: 20 mmol/L — ABNORMAL LOW (ref 22–32)
Calcium: 9.4 mg/dL (ref 8.9–10.3)
Chloride: 98 mmol/L (ref 98–111)
Creatinine, Ser: 0.92 mg/dL (ref 0.61–1.24)
GFR, Estimated: >60 mL/min (ref >60)
Glucose, Bld: 151 mg/dL — ABNORMAL HIGH (ref 70–99)
Potassium: 3.2 mmol/L — ABNORMAL LOW (ref 3.5–5.1)
Sodium: 132 mmol/L — ABNORMAL LOW (ref 135–145)
Total Bilirubin: 2.3 mg/dL — ABNORMAL HIGH (ref 0.3–1.2)
Total Protein: 7 g/dL (ref 6.5–8.1)

## 2022-03-18 LAB — URINALYSIS, ROUTINE W REFLEX MICROSCOPIC
Bacteria, UA: NONE SEEN
Bilirubin Urine: NEGATIVE
Glucose, UA: >=500 mg/dL — AB
Hgb urine dipstick: NEGATIVE
Ketones, ur: 5 mg/dL — AB
Leukocytes,Ua: NEGATIVE
Nitrite: NEGATIVE
Protein, ur: NEGATIVE mg/dL
Specific Gravity, Urine: 1.024 (ref 1.005–1.030)
pH: 5 (ref 5.0–8.0)

## 2022-03-18 LAB — CBC WITH DIFFERENTIAL/PLATELET
Abs Immature Granulocytes: 0.03 10*3/uL (ref 0.00–0.07)
Basophils Absolute: 0 10*3/uL (ref 0.0–0.1)
Basophils Relative: 0 %
Eosinophils Absolute: 0 10*3/uL (ref 0.0–0.5)
Eosinophils Relative: 0 %
HCT: 38.7 % — ABNORMAL LOW (ref 39.0–52.0)
Hemoglobin: 13.5 g/dL (ref 13.0–17.0)
Immature Granulocytes: 0 %
Lymphocytes Relative: 3 %
Lymphs Abs: 0.3 10*3/uL — ABNORMAL LOW (ref 0.7–4.0)
MCH: 29.6 pg (ref 26.0–34.0)
MCHC: 34.9 g/dL (ref 30.0–36.0)
MCV: 84.9 fL (ref 80.0–100.0)
Monocytes Absolute: 0.6 10*3/uL (ref 0.1–1.0)
Monocytes Relative: 6 %
Neutro Abs: 10.3 10*3/uL — ABNORMAL HIGH (ref 1.7–7.7)
Neutrophils Relative %: 91 %
Platelets: 237 10*3/uL (ref 150–400)
RBC: 4.56 MIL/uL (ref 4.22–5.81)
RDW: 12.5 % (ref 11.5–15.5)
WBC: 11.3 10*3/uL — ABNORMAL HIGH (ref 4.0–10.5)
nRBC: 0 % (ref 0.0–0.2)

## 2022-03-18 LAB — LIPASE, BLOOD: Lipase: 34 U/L (ref 11–51)

## 2022-03-18 LAB — CBG MONITORING, ED: Glucose-Capillary: 161 mg/dL — ABNORMAL HIGH (ref 70–99)

## 2022-03-18 LAB — TROPONIN I (HIGH SENSITIVITY): Troponin I (High Sensitivity): 10 ng/L (ref <18)

## 2022-03-18 NOTE — ED Provider Triage Note (Signed)
Emergency Medicine Provider Triage Evaluation Note  LYNCOLN LEDGERWOOD , a 61 y.o. male  was evaluated in triage.  Pt complains of 2 days of vomiting, feeling sick to his stomach.  Epigastric and lower chest pain.  Has not been taking medicine to help with his symptoms.  Denies any diarrhea.  Review of Systems  Positive: Above Negative: Diarrhea, shortness of breath  Physical Exam  There were no vitals taken for this visit. Gen:   Awake, no distress   Resp:  Normal effort  MSK:   Moves extremities without difficulty  Other:  Abdomen is soft  Medical Decision Making  Medically screening exam initiated at 9:26 PM.  Appropriate orders placed.  Jeffey Janssen Soohoo was informed that the remainder of the evaluation will be completed by another provider, this initial triage assessment does not replace that evaluation, and the importance of remaining in the ED until their evaluation is complete.  Labs and imaging ordered   [name], Cordelia Poche 03/18/22 [id number]

## 2022-03-18 NOTE — ED Triage Notes (Signed)
Pt reported to ED with c/o generalized bodyaches, nausea and chest pain x past few days. Pt states he recently traveled to [place] when symptoms began.

## 2022-03-18 NOTE — Patient Instructions (Addendum)
Pre visit review using our clinic review tool, if applicable. No additional management support is needed unless otherwise documented below in the visit note.  Continue 1 tablet daily except take 1 1/2 tablets on Mondays and Thursdays.  Re-check in 1 week. 

## 2022-03-18 NOTE — Progress Notes (Signed)
Pt did not receive msg sent last week that coumadin nurse would not be in the office from 6/29 to 7/6. So test on 6/29 was performed with a result of 3.2   Continue 1 tablet daily except take 1 1/2 tablets on Mondays and Thursdays.  Re-check in 1 week. Dosing instructions left on patient's My Chart. Patient checks INR with MD INR

## 2022-03-19 ENCOUNTER — Emergency Department (HOSPITAL_COMMUNITY): Payer: BC Managed Care – PPO

## 2022-03-19 DIAGNOSIS — R1084 Generalized abdominal pain: Secondary | ICD-10-CM | POA: Diagnosis not present

## 2022-03-19 LAB — PROTIME-INR
INR: 2.6 — ABNORMAL HIGH (ref 0.8–1.2)
Prothrombin Time: 27.4 seconds — ABNORMAL HIGH (ref 11.4–15.2)

## 2022-03-19 LAB — TROPONIN I (HIGH SENSITIVITY): Troponin I (High Sensitivity): 11 ng/L (ref <18)

## 2022-03-19 NOTE — ED Notes (Signed)
Patient going to ultrasound 

## 2022-03-19 NOTE — ED Notes (Signed)
Upon placing patient in hallway bed, patient had not had an EKG, ED tech at bedside now to get EKG.

## 2022-03-19 NOTE — ED Provider Notes (Signed)
[facility]  Provider Note  CSN: [id number] Arrival date & time: 03/18/22 2051  History Chief Complaint  Patient presents with   Fatigue    [facility] is a 61 y.o. male with history of CAD and aortic valve replacement on coumadin reports he was out of town in [place] earlier this week when he woke up with an episode of epigastric pain and vomiting. He had a tactile fever but did not want to go to the [facility] while out of town. He continues to have occasional squeezing pain but vomiting has improved. He still feels weak. No diarrhea. No dysuria. Denies CP or SOB to me, reported CP to triage. He returned from [place] on day of presentation and came to the ED for evaluation. Denies EtOH or drug use.    Home Medications Prior to Admission medications   Medication Sig Start Date End Date Taking? Authorizing Provider  acetaminophen (TYLENOL) 325 MG tablet Take 2 tablets (650 mg total) by mouth every 6 (six) hours as needed for mild pain. 10/17/15   Ardelle Balls, PA-C  albuterol (VENTOLIN HFA) 108 (90 Base) MCG/ACT inhaler Inhale 2 puffs into the lungs every 6 (six) hours as needed for wheezing or shortness of breath. 07/10/21   Burchette, Elberta Fortis, MD  carvedilol (COREG) 3.125 MG tablet Take 1 tablet (3.125 mg total) by mouth 2 (two) times daily. 01/25/22 04/25/22  Burchette, Elberta Fortis, MD  empagliflozin (JARDIANCE) 25 MG TABS tablet Take 1 tablet (25 mg total) by mouth daily before breakfast. 01/13/22   Burchette, Elberta Fortis, MD  gemfibrozil (LOPID) 600 MG tablet Take 1 tablet (600 mg total) by mouth in the morning and at bedtime. 01/25/22   Burchette, Elberta Fortis, MD  metFORMIN (GLUCOPHAGE) 1000 MG tablet Take 1 tablet (1,000 mg total) by mouth 2 (two) times daily with a meal. 01/25/22   Burchette, Elberta Fortis, MD  rosuvastatin (CRESTOR) 20 MG tablet TAKE 1 TABLET BY MOUTH EVERYDAY AT BEDTIME 01/25/22   Burchette, Elberta Fortis, MD  tadalafil (CIALIS) 20 MG tablet Take 1 tablet  every other day as needed for erectile dysfunction 05/15/21   Burchette, Elberta Fortis, MD  warfarin (COUMADIN) 10 MG tablet TAKE 1 TABLET DAILY EXCEPT TAKE 1 1/2 ON MONDAYS AND THURSDAYS OR TAKE AS DIRECTED BY ANTICOAGULATION CLINIC 01/25/22   Burchette, Elberta Fortis, MD     Allergies    Patient has no known allergies.   Review of Systems   Review of Systems Please see HPI for pertinent positives and negatives  Physical Exam BP 110/71 (BP Location: Right Arm)   Pulse 70   Temp 97.9 F (36.6 C) (Oral)   Resp 16   SpO2 97%   Physical Exam Vitals and nursing note reviewed.  Constitutional:      Appearance: Normal appearance.  HENT:     Head: Normocephalic and atraumatic.     Nose: Nose normal.     Mouth/Throat:     Mouth: Mucous membranes are moist.  Eyes:     Extraocular Movements: Extraocular movements intact.     Conjunctiva/sclera: Conjunctivae normal.  Cardiovascular:     Rate and Rhythm: Normal rate.  Pulmonary:     Effort: Pulmonary effort is normal.     Breath sounds: Normal breath sounds.  Abdominal:     General: Abdomen is flat.     Palpations: Abdomen is soft.     Tenderness: There is no abdominal tenderness. There is no guarding.  Comments: ? hepatomegaly  Musculoskeletal:        General: No swelling. Normal range of motion.     Cervical back: Neck supple.  Skin:    General: Skin is warm and dry.  Neurological:     General: No focal deficit present.     Mental Status: He is alert.  Psychiatric:        Mood and Affect: Mood normal.     ED Results / Procedures / Treatments   EKG EKG Interpretation  Date/Time:  Friday March 19 2022 04:54:40 EDT Ventricular Rate:  74 PR Interval:  172 QRS Duration: 118 QT Interval:  400 QTC Calculation: 444 R Axis:   -11 Text Interpretation: Normal sinus rhythm RSR' or QR pattern in V1 suggests right ventricular conduction delay Inferior infarct , age undetermined Abnormal ECG When compared with ECG of 18-Jan-2020 14:48,  No significant change since last tracing Confirmed by Susy Frizzle [phone]) on 03/19/2022 5:18:07 AM  Procedures Procedures  Medications Ordered in the ED Medications - No data to display  Initial Impression and Plan  Patient here with an episode of epigastric pain earlier this week, not as severe now but continues to feel unwell. He had labs done in triage showing CBC with mild leukocytosis, UA is clear. Trop neg x 2. Lipase is normal. CMP shows moderately elevated LFTs and bilirubin, concerning for biliary obstruction. Will send for [place] to evaluate. I personally viewed the images from radiology studies and agree with radiologist interpretation: CXR is clear.    ED Course   Clinical Course as of 03/19/22 7322  [facility] Mar 19, 2022  0712 I personally viewed the images from radiology studies and agree with radiologist interpretation: [place] with possible stone, but no signs of obstruction or infection. I suspect he passed a stone earlier in the week but he appears comfortable now and his abdomen is benign. Plan discharge with outpatient Gen Surg follow up. Also recommend PCP follow up in a few days to repeat his LFTs. Advised to return to the ED for any worsening pain, vomiting or fever.   [CS]    Clinical Course User Index [CS] Pollyann Savoy, MD     MDM Rules/Calculators/A&P Medical Decision Making Given presenting complaint, I considered that admission might be necessary. After review of results from ED lab and/or imaging studies, admission to the [facility] is not indicated at this time.    Problems Addressed: Biliary colic: acute illness or injury Calculus of gallbladder without cholecystitis without obstruction: chronic illness or injury  Amount and/or Complexity of Data Reviewed Labs: ordered. Decision-making details documented in ED Course. Radiology: ordered and independent interpretation performed. Decision-making details documented in ED Course. ECG/medicine tests: ordered and  independent interpretation performed. Decision-making details documented in ED Course.  Risk Decision regarding hospitalization.    Final Clinical Impression(s) / ED Diagnoses Final diagnoses:  Biliary colic  Calculus of gallbladder without cholecystitis without obstruction    Rx / DC Orders ED Discharge Orders     None        Pollyann Savoy, MD 03/19/22 [phone]

## 2022-03-23 ENCOUNTER — Ambulatory Visit (INDEPENDENT_AMBULATORY_CARE_PROVIDER_SITE_OTHER): Payer: BC Managed Care – PPO

## 2022-03-23 DIAGNOSIS — Z7901 Long term (current) use of anticoagulants: Secondary | ICD-10-CM | POA: Diagnosis not present

## 2022-03-23 LAB — POCT INR: INR: 3.2 — AB (ref 2.0–3.0)

## 2022-03-23 NOTE — Patient Instructions (Addendum)
Pre visit review using our clinic review tool, if applicable. No additional management support is needed unless otherwise documented below in the visit note.  Continue 1 tablet daily except take 1 1/2 tablets on Mondays and Thursdays.  Re-check in 1 week. 

## 2022-03-23 NOTE — Progress Notes (Signed)
Continue 1 tablet daily except take 1 1/2 tablets on Mondays and Thursdays.  Re-check in 1 week. Dosing instructions left on patient's My Chart. Patient checks INR with MD INR 

## 2022-03-29 ENCOUNTER — Ambulatory Visit: Payer: BC Managed Care – PPO | Admitting: Family Medicine

## 2022-03-29 ENCOUNTER — Encounter: Payer: Self-pay | Admitting: Family Medicine

## 2022-03-29 VITALS — BP 116/62 | HR 65 | Temp 97.7°F | Ht 67.0 in | Wt 174.4 lb

## 2022-03-29 DIAGNOSIS — R1013 Epigastric pain: Secondary | ICD-10-CM

## 2022-03-29 DIAGNOSIS — R7401 Elevation of levels of liver transaminase levels: Secondary | ICD-10-CM | POA: Diagnosis not present

## 2022-03-29 NOTE — Patient Instructions (Signed)
Follow up immediately for any fever or recurrent abdominal pain.   We probably do need to look at surgical referral.

## 2022-03-29 NOTE — Progress Notes (Signed)
Established Patient Office Visit  Subjective   Patient ID: [facility], male    DOB: [birth year]  Age: 61 y.o. MRN: [id number]  Chief Complaint  Patient presents with   Hospitalization Follow-up    HPI   [facility] has history of CAD, type 2 diabetes, hyperlipidemia, osteoarthritis.  [facility] was up to [facility] [place] July 4 with 2 of his grandsons.  While [facility] was there [facility] developed some nausea without vomiting and some epigastric pain.  Had some chills and subjective fever.  Did not go to the hospital at there but drove back.  Went to the ER on the sixth.  Denied any recent chest pain.  [facility] has history of CAD with aortic valve replacement is on Coumadin.  In ER troponins negative x2.  EKG no acute changes.  CBC showed white count of 11,000.  Lipase negative.  Moderately elevated liver transaminases with concern for possible biliary obstruction.  Ultrasound obtained, bile duct 5 mm.  No gallbladder wall thickening.  Interpretation of probable cholelithiasis and hepatic steatosis.  No acute findings.  Symptoms have improved since then.  There have been discussion of follow-up with primary for repeat lab work and consideration for referral to general surgeon.  [facility] denies any recurrent fever.  No chest pains.  No dyspnea.  Past Medical History:  Diagnosis Date   Aortic stenosis due to bicuspid aortic valve 09/2014   Severe aortic stenosis of bicuspid valve - s/pAVR with [place] ( 09/2015)   Bell's palsy    LAST EPISODE 09-2015   CAD S/P percutaneous coronary angioplasty 09/20/2014   a. inferolat STEMI ->> LHC-Angio: Proximal/ostial LAD 20%, OM2 99% -->> PCI: 26mm x 16 mm Promus Premier DES to the OM2.(~3.5 mm)   Diabetes mellitus type 2, controlled, with complications (HCC)    History of mechanical aortic valve replacement 10/09/2015   after 1 yr DAPT for Inferolateral STEMI. (Dr. Laneta Simmers)   Hyperlipidemia    Hypertension    Hypertriglyceridemia    Shoulder arthritis 07/11/2020   Rt Severe GH DJD xray  Sep 2021   ST elevation myocardial infarction (STEMI) of inferior wall (HCC) 09/20/2014   99 % occluded Cx-OM2 Promus DES 3.0 mm x 16 mm (3.5 mm)   Past Surgical History:  Procedure Laterality Date   BENTALL PROCEDURE N/A 10/09/2015   Procedure: BENTALL PROCEDURE (using a St Jude mechanical valve, size 23);  Surgeon: Alleen Borne, MD;  Location: [facility] OR;  Service: Open Heart Surgery;  Laterality: N/A;  CIRC ARRESTNEEDS RIGHT RADIAL A-LINE   COLONOSCOPY WITH PROPOFOL N/A 01/21/2020   Procedure: COLONOSCOPY WITH PROPOFOL;  Surgeon: Lynann Bologna, MD;  Location: [facility] ENDOSCOPY;  Service: Endoscopy;  Laterality: N/A;   ENTEROSCOPY N/A 01/24/2020   Procedure: ENTEROSCOPY;  Surgeon: Sherrilyn Rist, MD;  Location: [facility] ENDOSCOPY;  Service: Gastroenterology;  Laterality: N/A;   ESOPHAGOGASTRODUODENOSCOPY (EGD) WITH PROPOFOL N/A 01/21/2020   Procedure: ESOPHAGOGASTRODUODENOSCOPY (EGD) WITH PROPOFOL;  Surgeon: Lynann Bologna, MD;  Location: [facility] ENDOSCOPY;  Service: Endoscopy;  Laterality: N/A;   GIVENS CAPSULE STUDY N/A 01/22/2020   Procedure: GIVENS CAPSULE STUDY;  Surgeon: Sherrilyn Rist, MD;  Location: [facility] ENDOSCOPY;  Service: Gastroenterology;  Laterality: N/A;   KNEE ARTHROSCOPY W/ ACL RECONSTRUCTION Right    90's; "2 ATHROSCOPIC , 2 REBUILDS"   LEFT HEART CATHETERIZATION WITH CORONARY ANGIOGRAM N/A 09/20/2014   Procedure: LEFT HEART CATHETERIZATION WITH CORONARY ANGIOGRAM;  Surgeon: Marykay Lex, MD;  Location: [facility] CATH LAB;  Proximal/ostial LAD 20%, OM2  99% -->> aspiration thrombectomy and DES PCI:    PERCUTANEOUS CORONARY STENT INTERVENTION (PCI-S)  09/20/2014   Aspiration thrombectomy and DES PCI- OM 2(m-dCx): Promus Premier DES 3.0 mm x 16 mm (3.5 mm)   RIGHT/LEFT HEART CATH AND CORONARY ANGIOGRAPHY  09/2015   Dr. Nicki Guadalajara: Widely patent OM 2 stent.  Mild disease in RCA and LAD.Relatively normal right heart cath pressures.  Normal cardiac output.   TEE WITHOUT CARDIOVERSION N/A 10/09/2015    Procedure: TRANSESOPHAGEAL ECHOCARDIOGRAM (TEE);  Surgeon: Alleen Borne, MD;  Location[facility] OR;  Service: Open Heart Surgery;  Laterality: N/A;   TOTAL KNEE ARTHROPLASTY Right 12/09/2017   Procedure: RIGHT TOTAL KNEE ARTHROPLASTY REMOVAL OF HARDWARE RIGHT KNEE;  Surgeon: Jodi Geralds, MD;  Location: [facility] ORS;  Service: Orthopedics;  Laterality: Right;   TRANSTHORACIC ECHOCARDIOGRAM  02/2016   Post AVR: mechanical: AVR without obstuction. LVEF improved to 65-70%.   TRANSTHORACIC ECHOCARDIOGRAM  09/2014, 10/2014   Probable bicuspid aortic valve: a. mod-sev by 2D ECHO 09/2014. AVA 0.9cm^2; b) 10/2014 Echo:. Severe AS Mn-Pk Gradient 41-71 mmHg, AVA ~0.79 cm2, EF 60-65%   TRANSTHORACIC ECHOCARDIOGRAM  12/2019    Normal WM. Gr 2 DD. Mild LA dilation. 23 mm St. Jude Mechanical AoV well seated.  Trivial AI.     reports that [facility] has never smoked. [facility] quit smokeless tobacco use about 36 years ago.  His smokeless tobacco use included chew. [facility] reports current alcohol use. [facility] reports that [facility] does not use drugs. family history includes Heart disease in his mother. No Known Allergies  Review of Systems  Constitutional:        See HPI   Respiratory:  Negative for cough and shortness of breath.   Cardiovascular:  Negative for chest pain.  Gastrointestinal:        See HPI  Genitourinary:  Negative for dysuria.      Objective:     BP 116/62 (BP Location: Left Arm, Patient Position: Sitting, Cuff Size: Normal)   Pulse 65   Temp 97.7 F (36.5 C) (Oral)   Ht 5\' 7"  (1.702 m)   Wt 174 lb 6.4 oz (79.1 kg)   SpO2 99%   BMI 27.31 kg/m  BP Readings from Last 3 Encounters:  03/29/22 116/62  03/19/22 110/71  12/14/21 110/70   Wt Readings from Last 3 Encounters:  03/29/22 174 lb 6.4 oz (79.1 kg)  12/14/21 174 lb 8 oz (79.2 kg)  08/24/21 172 lb 4.8 oz (78.2 kg)      Physical Exam Vitals reviewed.  Constitutional:      Appearance: Normal appearance.  Cardiovascular:     Rate and Rhythm: Normal rate.   Pulmonary:     Effort: Pulmonary effort is normal.     Breath sounds: Normal breath sounds. No wheezing or rales.  Abdominal:     General: Bowel sounds are normal. There is no distension.     Palpations: Abdomen is soft. There is no mass.     Tenderness: There is no abdominal tenderness. There is no guarding or rebound.  Neurological:     Mental Status: [facility] is alert.      No results found for any visits on 03/29/22.  Last CBC Lab Results  Component Value Date   WBC 11.3 (H) 03/18/2022   HGB 13.5 03/18/2022   HCT 38.7 (L) 03/18/2022   MCV 84.9 03/18/2022   MCH 29.6 03/18/2022   RDW 12.5 03/18/2022   PLT 237 03/18/2022   Last metabolic panel  Lab Results  Component Value Date   GLUCOSE 151 (H) 03/18/2022   NA 132 (L) 03/18/2022   K 3.2 (L) 03/18/2022   CL 98 03/18/2022   CO2 20 (L) 03/18/2022   BUN 12 03/18/2022   CREATININE 0.92 03/18/2022   GFRNONAA >60 03/18/2022   CALCIUM 9.4 03/18/2022   PROT 7.0 03/18/2022   ALBUMIN 4.1 03/18/2022   LABGLOB 2.2 08/28/2020   AGRATIO 2.3 (H) 08/28/2020   BILITOT 2.3 (H) 03/18/2022   ALKPHOS 46 03/18/2022   AST 318 (H) 03/18/2022   ALT 173 (H) 03/18/2022   ANIONGAP 14 03/18/2022      The ASCVD Risk score (Arnett DK, et al., 2019) failed to calculate for the following reasons:   The patient has a prior MI or stroke diagnosis    Assessment & Plan:   Problem List Items Addressed This Visit   None Visit Diagnoses     Transaminasemia    -  Primary   Relevant Orders   Hepatic function panel     Patient presented with recent epigastric pain with nausea and elevated liver transaminases.  Concern is whether [facility] had recent acute biliary colic and passed a stone-but no signs of obstruction or acute infection.  Symptomatically improved at this time  -Repeat liver panel -We will likely set up general surgical referral but get labs first -Follow-up immediately for any recurrent fever, abdominal pain, or other concerns  No  follow-ups on file.    Evelena Peat, MD

## 2022-03-30 ENCOUNTER — Ambulatory Visit (INDEPENDENT_AMBULATORY_CARE_PROVIDER_SITE_OTHER): Payer: BC Managed Care – PPO

## 2022-03-30 DIAGNOSIS — Z7901 Long term (current) use of anticoagulants: Secondary | ICD-10-CM | POA: Diagnosis not present

## 2022-03-30 LAB — HEPATIC FUNCTION PANEL
ALT: 19 U/L (ref 0–53)
AST: 16 U/L (ref 0–37)
Albumin: 4.8 g/dL (ref 3.5–5.2)
Alkaline Phosphatase: 40 U/L (ref 39–117)
Bilirubin, Direct: 0.1 mg/dL (ref 0.0–0.3)
Total Bilirubin: 0.7 mg/dL (ref 0.2–1.2)
Total Protein: 7.4 g/dL (ref 6.0–8.3)

## 2022-03-30 LAB — POCT INR: INR: 3 (ref 2.0–3.0)

## 2022-03-30 NOTE — Addendum Note (Signed)
Addended by: Christy Sartorius on: 03/30/2022 03:10 PM   Modules accepted: Orders

## 2022-03-30 NOTE — Progress Notes (Signed)
Continue 1 tablet daily except take 1 1/2 tablets on Mondays and Thursdays.  Re-check in 1 week. Dosing instructions left on patient's My Chart. Patient checks INR with MD INR 

## 2022-03-30 NOTE — Patient Instructions (Addendum)
Pre visit review using our clinic review tool, if applicable. No additional management support is needed unless otherwise documented below in the visit note.  Continue 1 tablet daily except take 1 1/2 tablets on Mondays and Thursdays.  Re-check in 1 week. 

## 2022-04-06 ENCOUNTER — Ambulatory Visit (INDEPENDENT_AMBULATORY_CARE_PROVIDER_SITE_OTHER): Payer: BC Managed Care – PPO

## 2022-04-06 DIAGNOSIS — Z7901 Long term (current) use of anticoagulants: Secondary | ICD-10-CM | POA: Diagnosis not present

## 2022-04-06 LAB — POCT INR: INR: 3.4 — AB (ref 2.0–3.0)

## 2022-04-06 NOTE — Patient Instructions (Addendum)
Pre visit review using our clinic review tool, if applicable. No additional management support is needed unless otherwise documented below in the visit note.  Continue 1 tablet daily except take 1 1/2 tablets on Mondays and Thursdays.  Re-check in 1 week. 

## 2022-04-06 NOTE — Progress Notes (Signed)
Continue 1 tablet daily except take 1 1/2 tablets on Mondays and Thursdays.  Re-check in 1 week. Dosing instructions left on patient's My Chart. Patient checks INR with MD INR 

## 2022-04-13 ENCOUNTER — Ambulatory Visit (INDEPENDENT_AMBULATORY_CARE_PROVIDER_SITE_OTHER): Payer: BC Managed Care – PPO

## 2022-04-13 DIAGNOSIS — Z7901 Long term (current) use of anticoagulants: Secondary | ICD-10-CM

## 2022-04-13 LAB — POCT INR: INR: 3.5 — AB (ref 2.0–3.0)

## 2022-04-13 NOTE — Patient Instructions (Addendum)
Pre visit review using our clinic review tool, if applicable. No additional management support is needed unless otherwise documented below in the visit note.  Continue 1 tablet daily except take 1 1/2 tablets on Mondays and Thursdays.  Re-check in 1 week. 

## 2022-04-13 NOTE — Progress Notes (Signed)
Continue 1 tablet daily except take 1 1/2 tablets on Mondays and Thursdays.  Re-check in 1 week. Dosing instructions left on patient's My Chart. Patient checks INR with MD INR

## 2022-04-20 ENCOUNTER — Ambulatory Visit (INDEPENDENT_AMBULATORY_CARE_PROVIDER_SITE_OTHER): Payer: BC Managed Care – PPO

## 2022-04-20 DIAGNOSIS — Z7901 Long term (current) use of anticoagulants: Secondary | ICD-10-CM | POA: Diagnosis not present

## 2022-04-20 LAB — POCT INR: INR: 3.4 — AB (ref 2.0–3.0)

## 2022-04-20 NOTE — Progress Notes (Signed)
Continue 1 tablet daily except take 1 1/2 tablets on Mondays and Thursdays.  Re-check in 1 week. Dosing instructions left on patient's My Chart. Patient checks INR with MD INR

## 2022-04-20 NOTE — Patient Instructions (Addendum)
Pre visit review using our clinic review tool, if applicable. No additional management support is needed unless otherwise documented below in the visit note.  Continue 1 tablet daily except take 1 1/2 tablets on Mondays and Thursdays.  Re-check in 1 week.

## 2022-04-27 ENCOUNTER — Ambulatory Visit (INDEPENDENT_AMBULATORY_CARE_PROVIDER_SITE_OTHER): Payer: BC Managed Care – PPO

## 2022-04-27 DIAGNOSIS — Z7901 Long term (current) use of anticoagulants: Secondary | ICD-10-CM | POA: Diagnosis not present

## 2022-04-27 LAB — POCT INR: INR: 1.8 — AB (ref 2.0–3.0)

## 2022-04-27 NOTE — Patient Instructions (Addendum)
Pre visit review using our clinic review tool, if applicable. No additional management support is needed unless otherwise documented below in the visit note.  Increase dose today to take 1 1/2 tablets and increase dose tomorrow to take 1 1/2 tablets and then continue 1 tablet daily except take 1 1/2 tablets on Mondays and Thursdays.  Re-check in 1 week.

## 2022-04-27 NOTE — Progress Notes (Signed)
Pt reported he had several servings of cole slaw this weekend, which he normally does not eat.  Increase dose today to take 1 1/2 tablets and increase dose tomorrow to take 1 1/2 tablets and then continue 1 tablet daily except take 1 1/2 tablets on Mondays and Thursdays.  Re-check in 1 week. Dosing instructions left on patient's My Chart. Patient checks INR with MD INR

## 2022-05-04 ENCOUNTER — Ambulatory Visit (INDEPENDENT_AMBULATORY_CARE_PROVIDER_SITE_OTHER): Payer: BC Managed Care – PPO

## 2022-05-04 DIAGNOSIS — Z7901 Long term (current) use of anticoagulants: Secondary | ICD-10-CM | POA: Diagnosis not present

## 2022-05-04 LAB — POCT INR: INR: 2.7 (ref 2.0–3.0)

## 2022-05-04 NOTE — Progress Notes (Signed)
Continue 1 tablet daily except take 1 1/2 tablets on Mondays and Thursdays.  Re-check in 1 week. Dosing instructions left on patient's My Chart. Patient checks INR with MD INR 

## 2022-05-04 NOTE — Patient Instructions (Addendum)
Pre visit review using our clinic review tool, if applicable. No additional management support is needed unless otherwise documented below in the visit note.  Continue 1 tablet daily except take 1 1/2 tablets on Mondays and Thursdays.  Re-check in 1 week. 

## 2022-05-10 ENCOUNTER — Telehealth: Payer: BC Managed Care – PPO | Admitting: Family Medicine

## 2022-05-11 ENCOUNTER — Ambulatory Visit (INDEPENDENT_AMBULATORY_CARE_PROVIDER_SITE_OTHER): Payer: BC Managed Care – PPO

## 2022-05-11 DIAGNOSIS — Z7901 Long term (current) use of anticoagulants: Secondary | ICD-10-CM | POA: Diagnosis not present

## 2022-05-11 LAB — POCT INR: INR: 2.8 (ref 2.0–3.0)

## 2022-05-11 NOTE — Progress Notes (Signed)
Continue 1 tablet daily except take 1 1/2 tablets on Mondays and Thursdays.  Re-check in 1 week, on Wednesday, 9/6. Dosing instructions left on patient's My Chart. Patient checks INR with MD INR

## 2022-05-11 NOTE — Patient Instructions (Addendum)
Pre visit review using our clinic review tool, if applicable. No additional management support is needed unless otherwise documented below in the visit note.  Continue 1 tablet daily except take 1 1/2 tablets on Mondays and Thursdays.  Re-check in 1 week, on Wednesday, 9/6.

## 2022-05-19 ENCOUNTER — Ambulatory Visit (INDEPENDENT_AMBULATORY_CARE_PROVIDER_SITE_OTHER): Payer: BC Managed Care – PPO

## 2022-05-19 DIAGNOSIS — Z7901 Long term (current) use of anticoagulants: Secondary | ICD-10-CM

## 2022-05-19 LAB — POCT INR: INR: 3.5 — AB (ref 2.0–3.0)

## 2022-05-19 NOTE — Patient Instructions (Addendum)
Pre visit review using our clinic review tool, if applicable. No additional management support is needed unless otherwise documented below in the visit note.  Continue 1 tablet daily except take 1 1/2 tablets on Mondays and Thursdays.  Re-check in 1 week, on Wednesday, 9/13.

## 2022-05-19 NOTE — Progress Notes (Signed)
Continue 1 tablet daily except take 1 1/2 tablets on Mondays and Thursdays.  Re-check in 1 week, on Wednesday, 9/13. Dosing instructions left on patient's My Chart. Patient checks INR with MD INR

## 2022-05-25 ENCOUNTER — Ambulatory Visit (INDEPENDENT_AMBULATORY_CARE_PROVIDER_SITE_OTHER): Payer: BC Managed Care – PPO

## 2022-05-25 DIAGNOSIS — Z7901 Long term (current) use of anticoagulants: Secondary | ICD-10-CM | POA: Diagnosis not present

## 2022-05-25 LAB — POCT INR: INR: 3.9 — AB (ref 2.0–3.0)

## 2022-05-25 LAB — HM DIABETES EYE EXAM

## 2022-05-25 NOTE — Patient Instructions (Addendum)
Pre visit review using our clinic review tool, if applicable. No additional management support is needed unless otherwise documented below in the visit note.  Hold dose today and then continue 1 tablet daily except take 1 1/2 tablets on Mondays and Thursdays.  Re-check in 1 week, on Wednesday, 9/19.

## 2022-05-25 NOTE — Progress Notes (Signed)
Hold dose today and then continue 1 tablet daily except take 1 1/2 tablets on Mondays and Thursdays.  Re-check in 1 week, on Wednesday, 9/19. Dosing instructions left on patient's My Chart. Patient checks INR with MD INR

## 2022-06-01 ENCOUNTER — Ambulatory Visit (INDEPENDENT_AMBULATORY_CARE_PROVIDER_SITE_OTHER): Payer: BC Managed Care – PPO

## 2022-06-01 ENCOUNTER — Encounter: Payer: Self-pay | Admitting: Family Medicine

## 2022-06-01 DIAGNOSIS — Z7901 Long term (current) use of anticoagulants: Secondary | ICD-10-CM

## 2022-06-01 LAB — POCT INR: INR: 2.4 (ref 2.0–3.0)

## 2022-06-01 NOTE — Progress Notes (Unsigned)
Increase dose today to 1 1/2 tablets. Then then continue 1 tablet daily except take 1 1/2 tablets on Mondays and Thursdays.  Re-check in 1 week, on Wednesday, 9/26. Dosing instructions left on patient's My Chart. Patient checks INR with MD INR

## 2022-06-08 LAB — POCT INR: INR: 3.3 — AB (ref 2.0–3.0)

## 2022-06-09 ENCOUNTER — Ambulatory Visit (INDEPENDENT_AMBULATORY_CARE_PROVIDER_SITE_OTHER): Payer: BC Managed Care – PPO

## 2022-06-09 DIAGNOSIS — Z7901 Long term (current) use of anticoagulants: Secondary | ICD-10-CM | POA: Diagnosis not present

## 2022-06-09 NOTE — Progress Notes (Signed)
I have reviewed and agree with note, evaluation, plan.   Soleia Badolato, MD  

## 2022-06-09 NOTE — Patient Instructions (Signed)
Patient checks INR with MD INR. Result from 9/26 is 3.3. Responded to pt via MyChart and instructed to him to continue normal dosing of 1 tablet daily except take 1 1/2 tablets on Mondays and Thursdays. Recheck on 06/16/22.

## 2022-06-09 NOTE — Progress Notes (Signed)
Patient checks INR with MD INR. Result from 9/26 is 3.3. Responded to pt via MyChart and instructed to him to continue normal dosing of 1 tablet daily except take 1 1/2 tablets on Mondays and Thursdays. Recheck on 06/16/22. 

## 2022-06-22 LAB — POCT INR: INR: 1.8 — AB (ref 2.0–3.0)

## 2022-06-23 ENCOUNTER — Ambulatory Visit (INDEPENDENT_AMBULATORY_CARE_PROVIDER_SITE_OTHER): Payer: BC Managed Care – PPO

## 2022-06-23 DIAGNOSIS — Z7901 Long term (current) use of anticoagulants: Secondary | ICD-10-CM | POA: Diagnosis not present

## 2022-06-23 NOTE — Progress Notes (Signed)
Patient checks INR with MD INR. Result from 10/10 is 1.8. Pt reports missing one dose last week while traveling.   Responded to pt via MyChart and instructed to him to increase dose today to 1 1/2 tablets and increase dose tomorrow to 2 tablets. Then continue normal dosing of 1 tablet daily except take 1 1/2 tablets on Mondays and Thursdays. Recheck on 06/29/22.

## 2022-06-23 NOTE — Patient Instructions (Signed)
Increase dose today to 1 1/2 tablets and increase dose tomorrow to 2 tablets. Then continue normal dosing of 1 tablet daily except take 1 1/2 tablets on Mondays and Thursdays. Recheck on 06/29/22.

## 2022-06-30 ENCOUNTER — Ambulatory Visit (INDEPENDENT_AMBULATORY_CARE_PROVIDER_SITE_OTHER): Payer: BC Managed Care – PPO

## 2022-06-30 DIAGNOSIS — Z7901 Long term (current) use of anticoagulants: Secondary | ICD-10-CM

## 2022-06-30 LAB — POCT INR: INR: 2.5 (ref 2.0–3.0)

## 2022-06-30 NOTE — Patient Instructions (Signed)
Continue to take 1 tablet daily except take 1 1/2 tablets on Mondays and Thursdays. Recheck on 07/07/22.

## 2022-06-30 NOTE — Progress Notes (Signed)
Patient checks INR at home with MD INR. Result from today is 2.5.  Responded to pt via MyChart and instructed to continue normal dosing of 1 tablet daily except take 1 1/2 tablets on Mondays and Thursdays. Recheck on 07/07/22.

## 2022-07-02 ENCOUNTER — Other Ambulatory Visit: Payer: Self-pay | Admitting: Family Medicine

## 2022-07-02 ENCOUNTER — Other Ambulatory Visit: Payer: Self-pay

## 2022-07-02 DIAGNOSIS — Z7901 Long term (current) use of anticoagulants: Secondary | ICD-10-CM

## 2022-07-02 MED ORDER — WARFARIN SODIUM 10 MG PO TABS: ORAL_TABLET | ORAL | 1 refills | Status: DC

## 2022-07-06 LAB — POCT INR: INR: 2.6 (ref 2.0–3.0)

## 2022-07-07 ENCOUNTER — Ambulatory Visit (INDEPENDENT_AMBULATORY_CARE_PROVIDER_SITE_OTHER): Payer: BC Managed Care – PPO

## 2022-07-07 DIAGNOSIS — Z7901 Long term (current) use of anticoagulants: Secondary | ICD-10-CM

## 2022-07-07 NOTE — Progress Notes (Signed)
Patient checks INR at home with MD INR. Result from yesterday,10/24, is 2.6.  Responded to pt via MyChart and instructed to continue normal dosing of 1 tablet daily except take 1 1/2 tablets on Mondays and Thursdays. Recheck on 07/13/22.

## 2022-07-07 NOTE — Patient Instructions (Signed)
Continue normal dosing of 1 tablet daily except take 1 1/2 tablets on Mondays and Thursdays. Recheck on 07/13/22.

## 2022-07-13 ENCOUNTER — Ambulatory Visit (INDEPENDENT_AMBULATORY_CARE_PROVIDER_SITE_OTHER): Payer: BC Managed Care – PPO

## 2022-07-13 DIAGNOSIS — Z7901 Long term (current) use of anticoagulants: Secondary | ICD-10-CM

## 2022-07-13 LAB — POCT INR: INR: 1.6 — AB (ref 2.0–3.0)

## 2022-07-13 NOTE — Progress Notes (Signed)
Patient checks INR at home with MD INR. Result from today is 1.6.  Pt reports eating brussels sprouts last week and eating spinach with dinner last night.   Responded to pt via MyChart and instructed him to increase dose today to 1 1/2 tablets and increase dose tomorrow to 1 1/2 tablets. Then continue normal dosing of 1 tablet daily except take 1 1/2 tablets on Mondays and Thursdays. Recheck in one week.

## 2022-07-13 NOTE — Patient Instructions (Signed)
Increase dose today to 1 1/2 tablets and increase dose tomorrow to 1 1/2 tablets. Then continue normal dosing of 1 tablet daily except take 1 1/2 tablets on Mondays and Thursdays. Recheck in one week.

## 2022-07-20 ENCOUNTER — Ambulatory Visit (INDEPENDENT_AMBULATORY_CARE_PROVIDER_SITE_OTHER): Payer: BC Managed Care – PPO

## 2022-07-20 DIAGNOSIS — Z7901 Long term (current) use of anticoagulants: Secondary | ICD-10-CM

## 2022-07-20 LAB — POCT INR: INR: 2.6 (ref 2.0–3.0)

## 2022-07-20 NOTE — Progress Notes (Signed)
Patient checks INR at home with MD INR. Result from today is 2.6.  Continue normal dosing of 1 tablet daily except take 1 1/2 tablets on Mondays and Thursdays. Recheck in one week, on 07/27/22.

## 2022-07-20 NOTE — Patient Instructions (Signed)
Continue normal dosing of 1 tablet daily except take 1 1/2 tablets on Mondays and Thursdays. Recheck in one week, on 07/27/22.

## 2022-07-27 ENCOUNTER — Ambulatory Visit (INDEPENDENT_AMBULATORY_CARE_PROVIDER_SITE_OTHER): Payer: BC Managed Care – PPO

## 2022-07-27 DIAGNOSIS — Z7901 Long term (current) use of anticoagulants: Secondary | ICD-10-CM | POA: Diagnosis not present

## 2022-07-27 LAB — POCT INR: INR: 2.3 (ref 2.0–3.0)

## 2022-07-27 NOTE — Patient Instructions (Signed)
Increase dose today to 1 1/2 tablets (15 mg total) then continue normal dosing of 1 tablet daily except take 1 1/2 tablets on Mondays and Thursdays. Recheck in one week, on 08/03/22.

## 2022-07-27 NOTE — Progress Notes (Signed)
Patient checks INR at home with MD INR. Result from today is 2.3.  Increase dose today to 1 1/2 tablets (15 mg total) then continue normal dosing of 1 tablet daily except take 1 1/2 tablets on Mondays and Thursdays. Recheck in one week, on 08/03/22.  MyChart message with above instructions sent to patient.

## 2022-07-30 ENCOUNTER — Telehealth: Payer: Self-pay

## 2022-07-30 NOTE — Telephone Encounter (Signed)
Patient calling regarding the surgical clearance form he needed for a procedure that was scheduled today but did not happen because the clearance was not completed in time.

## 2022-07-30 NOTE — Telephone Encounter (Signed)
Got reports from [facility] that they receive the urgent fax from [place] [employer] to fill out a clearance for patient's procedure on dental extraction. Also that he has appt this morning in 15 mins from the [place] reports.   Brought attention to Dr. Caryl Never. He states he is not able to fill out the form due to the time. He is not comfortable just to fill it out due to patient is on high risk- on blood thinner, has valve issues. As he is seeing patient and doesn't have time to review patient's chart.    Spoke to [employer] and [employer]. Inform her we receive the fax today and that Dr. Caryl Never is not able to fill out the form prior appt and that patient may have to reschedule. She inform that they had faxed it to [place] three times before. Reports to her that we just receive it today. Tresa Endo said she would talk to the provider and see how they would like to handle it.

## 2022-08-03 ENCOUNTER — Ambulatory Visit (INDEPENDENT_AMBULATORY_CARE_PROVIDER_SITE_OTHER): Payer: BC Managed Care – PPO

## 2022-08-03 DIAGNOSIS — Z7901 Long term (current) use of anticoagulants: Secondary | ICD-10-CM | POA: Diagnosis not present

## 2022-08-03 LAB — POCT INR: INR: 2.3 (ref 2.0–3.0)

## 2022-08-03 NOTE — Progress Notes (Signed)
Patient checks INR at home with MD INR. Result from today is 2.3.  Increase dose today to 1 1/2 tablets (15 mg total) then continue normal dosing of 1 tablet daily except take 1 1/2 tablets on Mondays and Thursdays. Recheck in one week, on 08/10/22.  MyChart message with above instructions sent to patient.

## 2022-08-03 NOTE — Patient Instructions (Signed)
Increase dose today to 1 1/2 tablets (15 mg total) then continue normal dosing of 1 tablet daily except take 1 1/2 tablets on Mondays and Thursdays. Recheck in one week, on 08/10/22.

## 2022-08-10 ENCOUNTER — Ambulatory Visit (INDEPENDENT_AMBULATORY_CARE_PROVIDER_SITE_OTHER): Payer: BC Managed Care – PPO

## 2022-08-10 DIAGNOSIS — Z7901 Long term (current) use of anticoagulants: Secondary | ICD-10-CM | POA: Diagnosis not present

## 2022-08-10 LAB — POCT INR: INR: 2.9 (ref 2.0–3.0)

## 2022-08-10 NOTE — Patient Instructions (Signed)
Continue to take 1 tablet daily except take 1 1/2 tablets on Mondays and Thursdays. Recheck in one week, on 08/17/22.

## 2022-08-10 NOTE — Progress Notes (Signed)
Patient checks INR at home with MD INR. Result from today is 2.9.  Continue to take 1 tablet daily except take 1 1/2 tablets on Mondays and Thursdays. Recheck in one week, on 08/17/22.  MyChart message with above instructions sent to patient.

## 2022-08-17 ENCOUNTER — Ambulatory Visit: Payer: Self-pay

## 2022-08-17 DIAGNOSIS — Z7901 Long term (current) use of anticoagulants: Secondary | ICD-10-CM

## 2022-08-17 LAB — POCT INR: INR: 3.4 — AB (ref 2.0–3.0)

## 2022-08-17 NOTE — Patient Instructions (Addendum)
Pre visit review using our clinic review tool, if applicable. No additional management support is needed unless otherwise documented below in the visit note.  Continue to take 1 tablet daily except take 1 1/2 tablets on Mondays and Thursdays. Recheck in one week, on 08/24/22.

## 2022-08-17 NOTE — Progress Notes (Signed)
Patient checks INR at home with [facility]. Result from today is 3.4.  Continue to take 1 tablet daily except take 1 1/2 tablets on Mondays and Thursdays. Recheck in one week, on 08/24/22.  MyChart message with above instructions sent to patient.

## 2022-08-18 ENCOUNTER — Telehealth: Payer: Self-pay | Admitting: Family Medicine

## 2022-08-18 NOTE — Telephone Encounter (Addendum)
Error. Please disregard

## 2022-08-18 NOTE — Telephone Encounter (Signed)
[facility]  [phone]   Faxed Medical Clearance weeks ago. Pt is on blood thinners.   They would still like medical clearance, so that they can go ahead and reschedule the Pt.   Appointment rescheduling is pending receipt of medical clearance.   Please advise.    Fax:  [phone]

## 2022-08-23 NOTE — Telephone Encounter (Signed)
Forms faxed

## 2022-08-24 ENCOUNTER — Ambulatory Visit (INDEPENDENT_AMBULATORY_CARE_PROVIDER_SITE_OTHER): Payer: BC Managed Care – PPO

## 2022-08-24 DIAGNOSIS — Z7901 Long term (current) use of anticoagulants: Secondary | ICD-10-CM

## 2022-08-24 LAB — POCT INR: INR: 3.2 — AB (ref 2.0–3.0)

## 2022-08-24 NOTE — Patient Instructions (Addendum)
Pre visit review using our clinic review tool, if applicable. No additional management support is needed unless otherwise documented below in the visit note.  Continue to take 1 tablet daily except take 1 1/2 tablets on Mondays and Thursdays. Recheck in one week, on 08/31/22.

## 2022-08-24 NOTE — Progress Notes (Signed)
Patient checks INR at home with [facility]. Result from today is 3.2.  Continue to take 1 tablet daily except take 1 1/2 tablets on Mondays and Thursdays. Recheck in one week, on 08/31/22.  MyChart message with above instructions sent to patient.

## 2022-08-31 ENCOUNTER — Ambulatory Visit (INDEPENDENT_AMBULATORY_CARE_PROVIDER_SITE_OTHER): Payer: BC Managed Care – PPO

## 2022-08-31 DIAGNOSIS — Z7901 Long term (current) use of anticoagulants: Secondary | ICD-10-CM

## 2022-08-31 LAB — POCT INR: INR: 3.2 — AB (ref 2.0–3.0)

## 2022-08-31 NOTE — Patient Instructions (Addendum)
Pre visit review using our clinic review tool, if applicable. No additional management support is needed unless otherwise documented below in the visit note.  Continue to take 1 tablet daily except take 1 1/2 tablets on Mondays and Thursdays. Recheck in two week, on 09/14/22.

## 2022-08-31 NOTE — Progress Notes (Signed)
Patient checks INR at home with [facility]. Result from today is 3.2.  Continue to take 1 tablet daily except take 1 1/2 tablets on Mondays and Thursdays. Recheck in two week, on 09/14/22.  MyChart message with above instructions sent to patient.

## 2022-09-07 ENCOUNTER — Telehealth: Payer: Self-pay

## 2022-09-07 NOTE — Telephone Encounter (Signed)
--  Caller states he had a tooth extracted on wednesday. He has had bleeding and clots since. He thought it had slowed down a bit, but this am he had a mouth full of blood and clots. He called the dentist who said he would try to squeeze him in . He takes coumadin. He did take it this am. They told him to continue on. Tuesday he was 2.2 INR. He submits in portal.  09/03/2022 9:14:33 AM Go to ED Now Thurmond Butts, RN, Meriam Sprague  Comments User: Bradly Chris, RN Date/Time Lamount Cohen Time): 09/03/2022 9:17:09 AM He says he will go to ER but cannot go right away. 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  Referrals [facility] - ED  09/07/22 1124 - Pt states he was able to see dentist & dentist repacked it on 09/03/22. Pt states when he woke up during the weekend, bleeding had subsided. States he has a f/u tomorrow if needed. Pt denies further needs at this time.

## 2022-09-14 ENCOUNTER — Ambulatory Visit (INDEPENDENT_AMBULATORY_CARE_PROVIDER_SITE_OTHER): Payer: BC Managed Care – PPO

## 2022-09-14 DIAGNOSIS — Z7901 Long term (current) use of anticoagulants: Secondary | ICD-10-CM

## 2022-09-14 LAB — POCT INR: INR: 2.2 (ref 2.0–3.0)

## 2022-09-14 NOTE — Patient Instructions (Addendum)
Pre visit review using our clinic review tool, if applicable. No additional management support is needed unless otherwise documented below in the visit note.  Increase dose today to take 1 1/2 tablets and the continue to take 1 tablet daily except take 1 1/2 tablets on Mondays and Thursdays. Recheck in one week, on 09/21/22.

## 2022-09-14 NOTE — Progress Notes (Addendum)
Patient checks INR at home with [facility]. Result from today is 2.2.  Increase dose today to take 1 1/2 tablets and the continue to take 1 tablet daily except take 1 1/2 tablets on Mondays and Thursdays. Recheck in one week, on 09/21/22.  MyChart message with above instructions sent to patient.

## 2022-09-15 DIAGNOSIS — Z111 Encounter for screening for respiratory tuberculosis: Secondary | ICD-10-CM | POA: Diagnosis not present

## 2022-09-17 DIAGNOSIS — Z0279 Encounter for issue of other medical certificate: Secondary | ICD-10-CM | POA: Diagnosis not present

## 2022-09-21 ENCOUNTER — Ambulatory Visit (INDEPENDENT_AMBULATORY_CARE_PROVIDER_SITE_OTHER): Payer: BC Managed Care – PPO

## 2022-09-21 DIAGNOSIS — Z7901 Long term (current) use of anticoagulants: Secondary | ICD-10-CM | POA: Diagnosis not present

## 2022-09-21 LAB — POCT INR: INR: 2.9 (ref 2.0–3.0)

## 2022-09-21 NOTE — Patient Instructions (Addendum)
Pre visit review using our clinic review tool, if applicable. No additional management support is needed unless otherwise documented below in the visit note.  Continue to take 1 tablet daily except take 1 1/2 tablets on Mondays and Thursdays. Recheck in one week, on 09/28/22.

## 2022-09-21 NOTE — Progress Notes (Signed)
Patient checks INR at home with [facility]. Result from today is 2.9.  Continue to take 1 tablet daily except take 1 1/2 tablets on Mondays and Thursdays. Recheck in one week, on 09/28/22.  MyChart message with above instructions sent to patient.

## 2022-09-28 ENCOUNTER — Ambulatory Visit (INDEPENDENT_AMBULATORY_CARE_PROVIDER_SITE_OTHER): Payer: BC Managed Care – PPO

## 2022-09-28 DIAGNOSIS — Z7901 Long term (current) use of anticoagulants: Secondary | ICD-10-CM

## 2022-09-28 LAB — POCT INR: INR: 2.8 (ref 2.0–3.0)

## 2022-09-28 NOTE — Patient Instructions (Addendum)
Pre visit review using our clinic review tool, if applicable. No additional management support is needed unless otherwise documented below in the visit note.  Continue to take 1 tablet daily except take 1 1/2 tablets on Mondays and Thursdays. Recheck in one week, on 10/05/22.

## 2022-09-28 NOTE — Progress Notes (Signed)
Patient checks INR at home with [facility]. Result from today is 2.8. Pt reported in msg today that he will be changing jobs and will be without insurance for Feb and March. 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 Continue to take 1 tablet daily except take 1 1/2 tablets on Mondays and Thursdays. Recheck in one week, on 10/05/22.

## 2022-10-05 ENCOUNTER — Ambulatory Visit (INDEPENDENT_AMBULATORY_CARE_PROVIDER_SITE_OTHER): Payer: BC Managed Care – PPO

## 2022-10-05 DIAGNOSIS — Z7901 Long term (current) use of anticoagulants: Secondary | ICD-10-CM | POA: Diagnosis not present

## 2022-10-05 LAB — POCT INR: INR: 4.6 — AB (ref 2.0–3.0)

## 2022-10-05 NOTE — Patient Instructions (Addendum)
Pre visit review using our clinic review tool, if applicable. No additional management support is needed unless otherwise documented below in the visit note.  Hold dose today and reduce dose tomorrow to take 1/2 tablet and the change weekly dose to take 1 tablet daily except take 1 1/2 tablets on Mondaays. Recheck in one week, on 10/12/22. If any abnormal bruising or bleeding please contact the office or go to the emergency room.

## 2022-10-05 NOTE — Progress Notes (Signed)
Patient checks INR at home with [facility]. Result from today is 4.6. Pt reported in msg last week that he will be changing jobs and will be without insurance for Feb and March. Per the [facility] representative, pt only gets charged by their company if he tests. So, it will be ok to push out testing intervals.  Sent msg to pt inquiring what may have caused increased INR. Pt reports increase in consumption of greens. Denies any other changes. This would not have increased INR, it would have decreased it. Do not want to change weekly dose greatly due to pt being stable on this dose for several months.  Hold dose today and reduce dose tomorrow to take 1/2 tablet and the change weekly dose to take 1 tablet daily except take 1 1/2 tablets on Mondaays. Recheck in one week, on 10/12/22. If any abnormal bruising or bleeding please contact the office or go to the emergency room.   MyChart message with above instructions sent to patient.

## 2022-10-12 NOTE — Telephone Encounter (Signed)
Adding order for lab draw INR incase pt decides to go to lab tomorrow morning.

## 2022-10-12 NOTE — Addendum Note (Signed)
Addended by: Randall An A on: 10/12/2022 03:49 PM   Modules accepted: Orders

## 2022-11-12 ENCOUNTER — Telehealth: Payer: Self-pay | Admitting: Family Medicine

## 2022-11-12 DIAGNOSIS — Z7901 Long term (current) use of anticoagulants: Secondary | ICD-10-CM

## 2022-11-12 NOTE — Telephone Encounter (Signed)
Amber with [employer] is calling to get in touch with the patient concerning his coumadin. I left a message on pt wife cell for her to return my call

## 2022-11-29 MED ORDER — WARFARIN SODIUM 10 MG PO TABS: ORAL_TABLET | ORAL | 0 refills | Status: DC

## 2022-11-29 NOTE — Telephone Encounter (Signed)
LVM with pt cell inquiring if pt has acquired insurance again so testing of INR can resume.

## 2022-11-29 NOTE — Telephone Encounter (Signed)
Pt returned call. He reports he will have insurance starting April 1.  Pt reports he is not sure if he will continue home testing INR or he may start coming into the office. He will check his new insurance benefits when he receives them after the first of the month. Pt reports he does need warfarin refill. Looked up cash price for pt on [facility]. Pt requested script be sent to [facility] in [place] due to [place] in script. Advised if any changes to contact the coumadin clinic. Pt verbalized understanding.

## 2022-12-20 NOTE — Telephone Encounter (Signed)
LVM for pt to call to schedule coumadin clinic apt or restart testing INR at home.

## 2022-12-21 NOTE — Telephone Encounter (Signed)
Sent mychart msg.

## 2022-12-23 NOTE — Telephone Encounter (Signed)
LVM

## 2023-01-03 ENCOUNTER — Other Ambulatory Visit: Payer: Self-pay | Admitting: Family Medicine

## 2023-01-04 NOTE — Telephone Encounter (Signed)
LVM and sent another mychart msg.

## 2023-01-07 NOTE — Telephone Encounter (Signed)
LVM on cell phone. Left HIPAA compliant VM on home phone.

## 2023-01-12 NOTE — Telephone Encounter (Signed)
LVM and sent letter

## 2023-01-17 ENCOUNTER — Ambulatory Visit: Payer: Managed Care, Other (non HMO) | Admitting: Family Medicine

## 2023-01-17 ENCOUNTER — Encounter: Payer: Self-pay | Admitting: Family Medicine

## 2023-01-17 VITALS — BP 110/70 | HR 95 | Temp 98.9°F | Ht 67.0 in | Wt 170.4 lb

## 2023-01-17 DIAGNOSIS — R1011 Right upper quadrant pain: Secondary | ICD-10-CM

## 2023-01-17 DIAGNOSIS — K8020 Calculus of gallbladder without cholecystitis without obstruction: Secondary | ICD-10-CM | POA: Diagnosis not present

## 2023-01-17 DIAGNOSIS — R519 Headache, unspecified: Secondary | ICD-10-CM

## 2023-01-17 DIAGNOSIS — E119 Type 2 diabetes mellitus without complications: Secondary | ICD-10-CM

## 2023-01-17 DIAGNOSIS — R6883 Chills (without fever): Secondary | ICD-10-CM | POA: Diagnosis not present

## 2023-01-17 DIAGNOSIS — Z7901 Long term (current) use of anticoagulants: Secondary | ICD-10-CM

## 2023-01-17 LAB — CBC WITH DIFFERENTIAL/PLATELET
Basophils Absolute: 0 10*3/uL (ref 0.0–0.1)
Basophils Relative: 0.1 % (ref 0.0–3.0)
Eosinophils Absolute: 0 10*3/uL (ref 0.0–0.7)
Eosinophils Relative: 0.1 % (ref 0.0–5.0)
HCT: 41.3 % (ref 39.0–52.0)
Hemoglobin: 14.1 g/dL (ref 13.0–17.0)
Lymphocytes Relative: 5 % — ABNORMAL LOW (ref 12.0–46.0)
Lymphs Abs: 0.6 10*3/uL — ABNORMAL LOW (ref 0.7–4.0)
MCHC: 34.1 g/dL (ref 30.0–36.0)
MCV: 84.8 fl (ref 78.0–100.0)
Monocytes Absolute: 1.2 10*3/uL — ABNORMAL HIGH (ref 0.1–1.0)
Monocytes Relative: 9.7 % (ref 3.0–12.0)
Neutro Abs: 10.9 10*3/uL — ABNORMAL HIGH (ref 1.4–7.7)
Neutrophils Relative %: 85.1 % — ABNORMAL HIGH (ref 43.0–77.0)
Platelets: 240 10*3/uL (ref 150.0–400.0)
RBC: 4.87 Mil/uL (ref 4.22–5.81)
RDW: 13.9 % (ref 11.5–15.5)
WBC: 12.8 10*3/uL — ABNORMAL HIGH (ref 4.0–10.5)

## 2023-01-17 LAB — PROTIME-INR
INR: 4 ratio — ABNORMAL HIGH (ref 0.8–1.0)
Prothrombin Time: 39.4 s — ABNORMAL HIGH (ref 9.6–13.1)

## 2023-01-17 LAB — COMPREHENSIVE METABOLIC PANEL
ALT: 30 U/L (ref 0–53)
AST: 23 U/L (ref 0–37)
Albumin: 4.6 g/dL (ref 3.5–5.2)
Alkaline Phosphatase: 40 U/L (ref 39–117)
BUN: 22 mg/dL (ref 6–23)
CO2: 23 mEq/L (ref 19–32)
Calcium: 9.8 mg/dL (ref 8.4–10.5)
Chloride: 91 mEq/L — ABNORMAL LOW (ref 96–112)
Creatinine, Ser: 1.22 mg/dL (ref 0.40–1.50)
GFR: 64.09 mL/min (ref >60.00)
Glucose, Bld: 97 mg/dL (ref 70–99)
Potassium: 4.1 mEq/L (ref 3.5–5.1)
Sodium: 127 mEq/L — ABNORMAL LOW (ref 135–145)
Total Bilirubin: 1.2 mg/dL (ref 0.2–1.2)
Total Protein: 7.8 g/dL (ref 6.0–8.3)

## 2023-01-17 LAB — HEMOGLOBIN A1C: Hgb A1c MFr Bld: 7 % — ABNORMAL HIGH (ref 4.6–6.5)

## 2023-01-17 LAB — POC COVID19 BINAXNOW: SARS Coronavirus 2 Ag: NEGATIVE

## 2023-01-17 LAB — LIPASE: Lipase: 72 U/L — ABNORMAL HIGH (ref 11.0–59.0)

## 2023-01-17 MED ORDER — URSODIOL 300 MG PO CAPS
300.0000 mg | ORAL_CAPSULE | Freq: Two times a day (BID) | ORAL | 2 refills | Status: DC
Start: 2023-01-17 — End: 2023-01-20

## 2023-01-17 MED ORDER — ONDANSETRON HCL 4 MG PO TABS: 4.0000 mg | ORAL_TABLET | Freq: Four times a day (QID) | ORAL | 0 refills | Status: DC | PRN

## 2023-01-17 NOTE — Progress Notes (Unsigned)
Acute Office Visit  Subjective:     Patient ID: Dennis Bates, male    DOB: [birth year], 61 y.o.   MRN: [id number]  Chief Complaint  Patient presents with   Anorexia    X2 days   Nausea    X2 days   Chills    X1 week, temperature not taken   Fatigue    X1 week   Headache    X5 days, tried Tylenol with some relief    Headache  Associated symptoms include abdominal pain, a fever and nausea.   Patient is in today for 1 week history of general malaise, nausea, chills, fatigue, headaches. States that he feels like he is going to throw up, states he hasn't eaten much, did have a piece of toast this morning but hasn't tried to eat much due to the nausea. He reports that   Review of Systems  Constitutional:  Positive for chills, diaphoresis, fever and malaise/fatigue.  Gastrointestinal:  Positive for abdominal pain and nausea.  Neurological:  Positive for headaches.        Objective:    BP 110/70 (BP Location: Left Arm, Patient Position: Sitting, Cuff Size: Normal)   Pulse 95   Temp 98.9 F (37.2 C) (Oral)   Ht 5\' 7"  (1.702 m)   Wt 170 lb 6.4 oz (77.3 kg)   SpO2 96%   BMI 26.69 kg/m  {Vitals History (Optional):23777}  Physical Exam Vitals reviewed.  Constitutional:      Appearance: He is well-developed and normal weight. He is ill-appearing.  Cardiovascular:     Rate and Rhythm: Normal rate and regular rhythm.     Heart sounds: Murmur heard.  Pulmonary:     Effort: Pulmonary effort is normal.     Breath sounds: Normal breath sounds. No wheezing.  Abdominal:     General: Bowel sounds are normal.     Palpations: Abdomen is soft.     Tenderness: There is abdominal tenderness. There is guarding.  Musculoskeletal:     Cervical back: Normal range of motion.  Skin:    General: Skin is warm.  Neurological:     Mental Status: He is alert and oriented to person, place, and time.  Psychiatric:        Mood and Affect: Mood normal.        Behavior: Behavior normal.      Results for orders placed or performed in visit on 01/17/23  POC COVID-19  Result Value Ref Range   SARS Coronavirus 2 Ag Negative Negative        Assessment & Plan:   Problem List Items Addressed This Visit       Unprioritized   Diabetes mellitus type II, non insulin dependent (HCC) (Chronic)   Relevant Orders   Hemoglobin A1c   Chronic anticoagulation - Primary   Relevant Orders   Protime-INR   Other Visit Diagnoses     RUQ pain       Relevant Medications   ondansetron (ZOFRAN) 4 MG tablet   Other Relevant Orders   CBC with Differential/Platelets   CMP   Lipase   Gallstones       Relevant Medications   ondansetron (ZOFRAN) 4 MG tablet   Nonintractable headache, unspecified chronicity pattern, unspecified headache type       Relevant Orders   POC COVID-19 (Completed)   Chills       Relevant Orders   POC COVID-19 (Completed)       Meds ordered this  encounter  Medications   ondansetron (ZOFRAN) 4 MG tablet    Sig: Take 1 tablet (4 mg total) by mouth every 6 (six) hours as needed for nausea or vomiting.    Dispense:  60 tablet    Refill:  0    No follow-ups on file.  Karie Georges, MD

## 2023-01-19 ENCOUNTER — Ambulatory Visit (HOSPITAL_BASED_OUTPATIENT_CLINIC_OR_DEPARTMENT_OTHER)
Admission: RE | Admit: 2023-01-19 | Discharge: 2023-01-19 | Disposition: A | Discharge status: Home / Self Care | Payer: Managed Care, Other (non HMO) | Source: Ambulatory Visit | Attending: Family Medicine | Admitting: Family Medicine

## 2023-01-19 ENCOUNTER — Emergency Department (HOSPITAL_BASED_OUTPATIENT_CLINIC_OR_DEPARTMENT_OTHER): Payer: Managed Care, Other (non HMO)

## 2023-01-19 ENCOUNTER — Emergency Department (HOSPITAL_BASED_OUTPATIENT_CLINIC_OR_DEPARTMENT_OTHER)
Admission: EM | Admit: 2023-01-19 | Discharge: 2023-01-19 | Disposition: A | Discharge status: Home / Self Care | Payer: Managed Care, Other (non HMO) | Source: Home / Self Care | Attending: Emergency Medicine | Admitting: Emergency Medicine

## 2023-01-19 ENCOUNTER — Other Ambulatory Visit (HOSPITAL_BASED_OUTPATIENT_CLINIC_OR_DEPARTMENT_OTHER): Payer: Self-pay

## 2023-01-19 ENCOUNTER — Other Ambulatory Visit: Payer: Self-pay

## 2023-01-19 ENCOUNTER — Encounter (HOSPITAL_BASED_OUTPATIENT_CLINIC_OR_DEPARTMENT_OTHER): Payer: Self-pay | Admitting: Emergency Medicine

## 2023-01-19 ENCOUNTER — Encounter: Payer: Self-pay | Admitting: Family Medicine

## 2023-01-19 DIAGNOSIS — E119 Type 2 diabetes mellitus without complications: Secondary | ICD-10-CM | POA: Insufficient documentation

## 2023-01-19 DIAGNOSIS — R1010 Upper abdominal pain, unspecified: Secondary | ICD-10-CM | POA: Insufficient documentation

## 2023-01-19 DIAGNOSIS — I2510 Atherosclerotic heart disease of native coronary artery without angina pectoris: Secondary | ICD-10-CM | POA: Insufficient documentation

## 2023-01-19 DIAGNOSIS — E860 Dehydration: Secondary | ICD-10-CM | POA: Insufficient documentation

## 2023-01-19 DIAGNOSIS — Z1152 Encounter for screening for COVID-19: Secondary | ICD-10-CM | POA: Insufficient documentation

## 2023-01-19 DIAGNOSIS — R5381 Other malaise: Secondary | ICD-10-CM | POA: Insufficient documentation

## 2023-01-19 DIAGNOSIS — R748 Abnormal levels of other serum enzymes: Secondary | ICD-10-CM | POA: Insufficient documentation

## 2023-01-19 DIAGNOSIS — R1031 Right lower quadrant pain: Secondary | ICD-10-CM | POA: Insufficient documentation

## 2023-01-19 DIAGNOSIS — R109 Unspecified abdominal pain: Secondary | ICD-10-CM | POA: Diagnosis not present

## 2023-01-19 DIAGNOSIS — T8453XA Infection and inflammatory reaction due to internal right knee prosthesis, initial encounter: Secondary | ICD-10-CM | POA: Diagnosis not present

## 2023-01-19 DIAGNOSIS — R5383 Other fatigue: Secondary | ICD-10-CM | POA: Insufficient documentation

## 2023-01-19 DIAGNOSIS — K8020 Calculus of gallbladder without cholecystitis without obstruction: Secondary | ICD-10-CM | POA: Insufficient documentation

## 2023-01-19 DIAGNOSIS — Z7901 Long term (current) use of anticoagulants: Secondary | ICD-10-CM | POA: Insufficient documentation

## 2023-01-19 DIAGNOSIS — R1011 Right upper quadrant pain: Secondary | ICD-10-CM | POA: Insufficient documentation

## 2023-01-19 LAB — COMPREHENSIVE METABOLIC PANEL
ALT: 25 U/L (ref 0–44)
AST: 28 U/L (ref 15–41)
Albumin: 4.3 g/dL (ref 3.5–5.0)
Alkaline Phosphatase: 38 U/L (ref 38–126)
Anion gap: 12 (ref 5–15)
BUN: 21 mg/dL (ref 8–23)
CO2: 23 mmol/L (ref 22–32)
Calcium: 9.7 mg/dL (ref 8.9–10.3)
Chloride: 97 mmol/L — ABNORMAL LOW (ref 98–111)
Creatinine, Ser: 0.9 mg/dL (ref 0.61–1.24)
GFR, Estimated: >60 mL/min (ref >60)
Glucose, Bld: 156 mg/dL — ABNORMAL HIGH (ref 70–99)
Potassium: 4.2 mmol/L (ref 3.5–5.1)
Sodium: 132 mmol/L — ABNORMAL LOW (ref 135–145)
Total Bilirubin: 1.4 mg/dL — ABNORMAL HIGH (ref 0.3–1.2)
Total Protein: 7.8 g/dL (ref 6.5–8.1)

## 2023-01-19 LAB — URINALYSIS, ROUTINE W REFLEX MICROSCOPIC
Bilirubin Urine: NEGATIVE
Glucose, UA: >1000 mg/dL — AB
Ketones, ur: 40 mg/dL — AB
Leukocytes,Ua: NEGATIVE
Nitrite: NEGATIVE
Specific Gravity, Urine: >1.046 — ABNORMAL HIGH (ref 1.005–1.030)
pH: 6 (ref 5.0–8.0)

## 2023-01-19 LAB — RESP PANEL BY RT-PCR (RSV, FLU A&B, COVID)  RVPGX2
Influenza A by PCR: NEGATIVE
Influenza B by PCR: NEGATIVE
Resp Syncytial Virus by PCR: NEGATIVE
SARS Coronavirus 2 by RT PCR: NEGATIVE

## 2023-01-19 LAB — CBC
HCT: 38.9 % — ABNORMAL LOW (ref 39.0–52.0)
Hemoglobin: 13.3 g/dL (ref 13.0–17.0)
MCH: 28.7 pg (ref 26.0–34.0)
MCHC: 34.2 g/dL (ref 30.0–36.0)
MCV: 83.8 fL (ref 80.0–100.0)
Platelets: 211 10*3/uL (ref 150–400)
RBC: 4.64 MIL/uL (ref 4.22–5.81)
RDW: 13.2 % (ref 11.5–15.5)
WBC: 9.4 10*3/uL (ref 4.0–10.5)
nRBC: 0 % (ref 0.0–0.2)

## 2023-01-19 LAB — CULTURE, BLOOD (ROUTINE X 2)

## 2023-01-19 LAB — PROTIME-INR
INR: 1.9 — ABNORMAL HIGH (ref 0.8–1.2)
Prothrombin Time: 22 seconds — ABNORMAL HIGH (ref 11.4–15.2)

## 2023-01-19 LAB — TROPONIN I (HIGH SENSITIVITY)
Troponin I (High Sensitivity): 9 ng/L (ref <18)
Troponin I (High Sensitivity): 12 ng/L (ref <18)

## 2023-01-19 LAB — AMMONIA: Ammonia: 17 umol/L (ref 9–35)

## 2023-01-19 LAB — LIPASE, BLOOD: Lipase: 166 U/L — ABNORMAL HIGH (ref 11–51)

## 2023-01-19 LAB — CK: Total CK: 104 U/L (ref 49–397)

## 2023-01-19 MED ORDER — LACTATED RINGERS IV BOLUS
1000.0000 mL | Freq: Once | INTRAVENOUS | Status: AC
  Administered 2023-01-19: 1000 mL via INTRAVENOUS

## 2023-01-19 MED ORDER — ONDANSETRON 4 MG PO TBDP
4.0000 mg | ORAL_TABLET | ORAL | 0 refills | Status: DC | PRN
  Filled 2023-01-19: qty 20, 4d supply, fill #0

## 2023-01-19 MED ORDER — LACTATED RINGERS IV SOLN: INTRAVENOUS | Status: DC

## 2023-01-19 MED ORDER — TRAMADOL HCL 50 MG PO TABS
50.0000 mg | ORAL_TABLET | Freq: Four times a day (QID) | ORAL | 0 refills | Status: DC | PRN
  Filled 2023-01-19: qty 20, 3d supply, fill #0

## 2023-01-19 MED ORDER — OMEPRAZOLE 20 MG PO CPDR
20.0000 mg | DELAYED_RELEASE_CAPSULE | Freq: Every day | ORAL | 0 refills | Status: DC
  Filled 2023-01-19: qty 30, 30d supply, fill #0

## 2023-01-19 MED ORDER — IOHEXOL 300 MG/ML  SOLN
100.0000 mL | Freq: Once | INTRAMUSCULAR | Status: AC | PRN
  Administered 2023-01-19: 85 mL via INTRAVENOUS

## 2023-01-19 NOTE — Assessment & Plan Note (Signed)
Will attempt treatment with actigall, pt states he is hesistant to have surgery, however it may be the recommended solution in the end.Marland Kitchen

## 2023-01-19 NOTE — ED Provider Notes (Signed)
[place] EMERGENCY DEPARTMENT AT [facility] Provider Note   CSN: [id number] Arrival date & time: 01/19/23  4098     History  Chief Complaint  Patient presents with   Abdominal Pain    Dennis Bates is a 62 y.o. male.  HPI Patient with history of CAD and aortic valve replacement on coumadin, diabetes.  Patient reports he has been ill for about 2 weeks.  He has been experiencing abdominal pain.  Periodically is quite severe.  Central and epigastric typically.  Patient had outpatient abdominal ultrasound this morning.  Identified some gallbladder sludge but no acute obstruction or wall thickening.  PCP recommended ED evaluation for ongoing symptoms.  Patient reports he has had significant loss of appetite.  He has been very nauseated but has not vomited.  He reports he has been eating very little.  He reports he has been feeling very weak and also getting short of breath.  At night he is experiencing sweats and chills.  His wife reports he runs a tactile fever but is not measuring it.  He is typically taking Tylenol in the evening for fever.  Patient is chronically anticoagulated for an aortic valve replacement with Coumadin.  He reports his INR was 4 yesterday.  Wife is also noted that he has had some mild confusion.  He reports all of this is very atypical.  At baseline patient is very healthy active and has normal cognitive function.  Patient reports he is just felt really bad for several weeks now and is getting worse.  He reports he feels very weak when he tries to get up and do anything.  He has had several episodes of large volume liquid stool.  No travel in the past 6 months.  Very little alcohol use.  No smoking.    Home Medications Prior to Admission medications   Medication Sig Start Date End Date Taking? Authorizing Provider  omeprazole (PRILOSEC) 20 MG capsule Take 1 capsule (20 mg total) by mouth daily. 01/19/23  Yes Arby Barrette, MD  ondansetron (ZOFRAN-ODT) 4 MG  disintegrating tablet Take 1 tablet (4 mg total) by mouth every 4 (four) hours as needed for nausea or vomiting. 01/19/23  Yes Arby Barrette, MD  traMADol (ULTRAM) 50 MG tablet Take 1 to 2 tablets every 6 hours as needed for pain. 01/19/23  Yes Arby Barrette, MD  acetaminophen (TYLENOL) 325 MG tablet Take 2 tablets (650 mg total) by mouth every 6 (six) hours as needed for mild pain. 10/17/15   Doree Fudge M, PA-C  carvedilol (COREG) 3.125 MG tablet TAKE 1 TABLET TWICE A DAY 01/04/23   Burchette, Elberta Fortis, MD  gemfibrozil (LOPID) 600 MG tablet TAKE 1 TABLET IN THE MORNING AND AT BEDTIME 01/04/23   Burchette, Elberta Fortis, MD  JARDIANCE 25 MG TABS tablet TAKE 1 TABLET DAILY BEFORE BREAKFAST 01/04/23   Burchette, Elberta Fortis, MD  metFORMIN (GLUCOPHAGE) 1000 MG tablet TAKE 1 TABLET TWICE A DAY WITH MEALS 01/04/23   Burchette, Elberta Fortis, MD  ondansetron (ZOFRAN) 4 MG tablet Take 1 tablet (4 mg total) by mouth every 6 (six) hours as needed for nausea or vomiting. 01/17/23   Karie Georges, MD  rosuvastatin (CRESTOR) 20 MG tablet TAKE 1 TABLET DAILY AT BEDTIME 01/04/23   Burchette, Elberta Fortis, MD  tadalafil (CIALIS) 20 MG tablet Take 1 tablet every other day as needed for erectile dysfunction 05/15/21   Burchette, Elberta Fortis, MD  ursodiol (ACTIGALL) 300 MG capsule Take 1 capsule (  300 mg total) by mouth 2 (two) times daily. 01/17/23   Karie Georges, MD  warfarin (COUMADIN) 10 MG tablet TAKE 1 TABLET DAILY EXCEPT TAKE 1 1/2 TABLETS ON MONDAYS AND THURSDAYS, OR TAKE AS DIRECTED BY ANTICOAGULATION CLINIC 11/29/22   Burchette, Elberta Fortis, MD      Allergies    Patient has no known allergies.    Review of Systems   Review of Systems  Physical Exam Updated Vital Signs BP 129/69 (BP Location: Left Arm)   Pulse 78   Temp 99.4 F (37.4 C) (Oral)   Resp 18   SpO2 98%  Physical Exam Constitutional:      Comments: Patient is alert.  Well-nourished well-developed.  He does appear mildly ill and uncomfortable.  HENT:      Head: Normocephalic and atraumatic.     Mouth/Throat:     Mouth: Mucous membranes are moist.     Pharynx: Oropharynx is clear.  Eyes:     Extraocular Movements: Extraocular movements intact.     Conjunctiva/sclera: Conjunctivae normal.     Pupils: Pupils are equal, round, and reactive to light.  Cardiovascular:     Rate and Rhythm: Normal rate and regular rhythm.  Pulmonary:     Effort: Pulmonary effort is normal.     Breath sounds: Normal breath sounds.  Abdominal:     Comments: Abdomen soft without guarding.  No focal pain to palpation.  No appreciable mass.  Musculoskeletal:        General: No swelling. Normal range of motion.     Cervical back: Neck supple.     Right lower leg: No edema.     Left lower leg: No edema.     Comments: No peripheral edema.  Lower legs are in good condition.  No joint swellings or erythema.  Skin:    General: Skin is warm and dry.  Neurological:     General: No focal deficit present.     Mental Status: He is oriented to person, place, and time.     Coordination: Coordination normal.  Psychiatric:        Mood and Affect: Mood normal.     ED Results / Procedures / Treatments   Labs (all labs ordered are listed, but only abnormal results are displayed) Labs Reviewed  LIPASE, BLOOD - Abnormal; Notable for the following components:      Result Value   Lipase 166 (*)    All other components within normal limits  COMPREHENSIVE METABOLIC PANEL - Abnormal; Notable for the following components:   Sodium 132 (*)    Chloride 97 (*)    Glucose, Bld 156 (*)    Total Bilirubin 1.4 (*)    All other components within normal limits  CBC - Abnormal; Notable for the following components:   HCT 38.9 (*)    All other components within normal limits  URINALYSIS, ROUTINE W REFLEX MICROSCOPIC - Abnormal; Notable for the following components:   Specific Gravity, Urine >1.046 (*)    Glucose, UA >1,000 (*)    Hgb urine dipstick SMALL (*)    Ketones, ur 40 (*)     Protein, ur TRACE (*)    Bacteria, UA FEW (*)    All other components within normal limits  PROTIME-INR - Abnormal; Notable for the following components:   Prothrombin Time 22.0 (*)    INR 1.9 (*)    All other components within normal limits  RESP PANEL BY RT-PCR (RSV, FLU A&B, COVID)  RVPGX2  CULTURE, BLOOD (ROUTINE X 2)  CULTURE, BLOOD (ROUTINE X 2)  AMMONIA  CK  TROPONIN I (HIGH SENSITIVITY)  TROPONIN I (HIGH SENSITIVITY)    EKG None  Radiology CT CHEST ABDOMEN PELVIS W CONTRAST  Result Date: 01/19/2023 CLINICAL DATA:  Sepsis.  Abdominal pain for 2 weeks with fever. EXAM: CT CHEST, ABDOMEN, AND PELVIS WITH CONTRAST TECHNIQUE: Multidetector CT imaging of the chest, abdomen and pelvis was performed following the standard protocol during bolus administration of intravenous contrast. RADIATION DOSE REDUCTION: This exam was performed according to the departmental dose-optimization program which includes automated exposure control, adjustment of the mA and/or kV according to patient size and/or use of iterative reconstruction technique. CONTRAST:  85mL OMNIPAQUE IOHEXOL 300 MG/ML  SOLN COMPARISON:  CTA abdomen and pelvis 01/21/2020. Chest CTA 09/24/2015 FINDINGS: CT CHEST FINDINGS Cardiovascular: Heart is nonenlarged. Trace pericardial fluid. Status post median sternotomy with prosthetic aortic valve. Also graft material along the ascending thoracic aorta. Mediastinum/Nodes: Normal caliber thoracic esophagus. Esophagus is mildly patulous. Preserved thyroid gland. No specific abnormal lymph node enlargement identified in the axillary region, hilum or mediastinum. Lungs/Pleura: There is some linear opacity seen along bases likely scar or atelectasis. No consolidation, pneumothorax or effusion. Mild breathing motion. Musculoskeletal: Scattered degenerative changes of the thoracic spine. CT ABDOMEN PELVIS FINDINGS Hepatobiliary: Stones in the nondilated gallbladder. No enhancing liver mass. Patent  portal vein. Pancreas: Unremarkable. No pancreatic ductal dilatation or surrounding inflammatory changes. Spleen: Normal in size without focal abnormality. Adrenals/Urinary Tract: The adrenal glands are preserved. No enhancing renal mass or collecting system dilatation. Nonspecific perinephric stranding. Preserved contours of the urinary bladder but the bladder wall slightly thickened. Please correlate with any symptoms. Stomach/Bowel: On this non oral contrast exam, the large bowel has a normal course and caliber with scattered stool. Normal appendix extends medial to the cecum in the right lower quadrant. Second portion duodenal diverticula. Third portion duodenal diverticula also seen. Small bowel overall is nondilated. Vascular/Lymphatic: Aortic atherosclerosis. No enlarged abdominal or pelvic lymph nodes. Retroaortic left renal vein. Reproductive: Prostate is unremarkable. Other: No free air or free fluid. Musculoskeletal: Scattered degenerative changes of the spine and pelvis. There are some areas of canal stenosis along the lower lumbar spine with posterior disc bulging and osteophyte formation. Transitional lumbosacral segment. IMPRESSION: No consolidation, pneumothorax or effusion. 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 No bowel obstruction, free air or free fluid. Normal appendix. Scattered stool. Gallstones in the nondilated gallbladder. Fatty liver infiltration. Slight wall thickening of the urinary bladder, nonspecific. Please correlate with any symptoms of a subtle cystitis. Electronically Signed   By: Karen Kays M.D.   On: 01/19/2023 10:18   CT Head Wo Contrast  Result Date: 01/19/2023 CLINICAL DATA:  Mental status change for 2 weeks. EXAM: CT HEAD WITHOUT CONTRAST TECHNIQUE: Contiguous axial images were obtained from the base of the skull through the vertex without intravenous contrast. RADIATION DOSE REDUCTION: This exam was performed according to the  departmental dose-optimization program which includes automated exposure control, adjustment of the mA and/or kV according to patient size and/or use of iterative reconstruction technique. COMPARISON:  Head CT 09/14/2013 FINDINGS: Brain: Small low densities in the bilateral cerebellum, convincing even when accounting for motion artifact. No evidence of supratentorial infarct. No hemorrhage, hydrocephalus, collection. Vascular: No hyperdense vessel or unexpected calcification. Skull: Normal. Negative for fracture or focal lesion. Sinuses/Orbits: No acute finding. 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 Motion degraded head CT especially at  the ventral brain. Electronically Signed   By: Tiburcio Pea M.D.   On: 01/19/2023 10:08   US Abdomen Complete  Result Date: 01/19/2023 CLINICAL DATA:  Cholelithiasis, elevated lipase, question obstruction EXAM: ABDOMEN ULTRASOUND COMPLETE COMPARISON:  03/19/2022 FINDINGS: Gallbladder: Multiple small shadowing calculi in gallbladder up to 5 mm diameter. Associated sludge. No gallbladder wall thickening, pericholecystic fluid, or sonographic Murphy sign. Common bile duct: Diameter: 6 mm, normal for age Liver: Heterogeneously increased echogenicity, likely fatty infiltration, though can be seen with cirrhosis and some infiltrative disorders. No mass or nodularity. Portal vein is patent on color Doppler imaging with normal direction of blood flow towards the liver. IVC: Normal appearance Pancreas: Normal appearance.  No mass or ductal dilatation. Spleen: Normal echogenicity. 13.3 cm greatest diameter. No focal abnormalities. Right Kidney: Length: 12.8 cm. Normal cortical thickness and echogenicity. Tiny cyst inferior pole 9 mm diameter, simple features; no follow-up imaging recommended. No additional mass or hydronephrosis. Left Kidney: Length: 12.6 cm. Normal morphology without mass or hydronephrosis. Abdominal aorta: Normal caliber Other findings: No  free fluid IMPRESSION: Cholelithiasis and sludge within gallbladder without evidence of acute cholecystitis or biliary dilatation. Probable fatty infiltration of liver as above. Remainder of exam unremarkable. Electronically Signed   By: Ulyses Southward M.D.   On: 01/19/2023 09:02    Procedures Procedures    Medications Ordered in ED Medications  lactated ringers infusion ( Intravenous New Bag/Given 01/19/23 1127)  lactated ringers bolus 1,000 mL (0 mLs Intravenous Stopped 01/19/23 1305)  iohexol (OMNIPAQUE) 300 MG/ML solution 100 mL (85 mLs Intravenous Contrast Given 01/19/23 0950)  lactated ringers bolus 1,000 mL (0 mLs Intravenous Stopped 01/19/23 1305)    ED Course/ Medical Decision Making/ A&P                             Medical Decision Making Amount and/or Complexity of Data Reviewed Labs: ordered. Radiology: ordered.  Risk Prescription drug management.   Patient describes an indolent course of episodic abdominal pain and now 2 weeks of worsening generalized symptoms possibly with febrile illness and loss of appetite.  Patient is chronically anticoagulated for aortic valve replacement.  Will proceed with broad diagnostic evaluation.  Will initiate fluid resuscitation with lactated Ringer's.   Patient's lipase has been mildly elevated on outpatient basis.  It is elevated at 177.  Patient did have ultrasound done prior to his emergency department presentation.  No specific obstructions identified.  Will proceed with CT scan for additional imaging of liver or gallbladder pancreas.  He also has history of an aortic valve replacement.  He endorses some shortness of breath.  Will include CT scan of the chest to rule out pneumonia\empyema/neoplasm.  Patient's urine has high specific gravity and ketones.  Findings suggest some dehydration.  Will continue with volume resuscitation with lactated Ringer's.  For generalized constitutional symptoms and shortness of breath, COVID and influenza test  obtained.  Results are negative.  CT abdomen pelvis reviewed by radiology: MPRESSION:  No consolidation, pneumothorax or effusion.    Postop chest with prosthetic aortic valve and graft along the  ascending aorta.    No bowel obstruction, free air or free fluid. Normal appendix.  Scattered stool.    Gallstones in the nondilated gallbladder.    Fatty liver infiltration.    Slight wall thickening of the urinary bladder, nonspecific. Please  correlate with any symptoms of a subtle cystitis.   After fluid resuscitation patient felt significantly better.  He has been up and ambulatory to the bathroom.  Reports significant constitutional improvement.  At this time without specific etiology for symptoms will plan for discharge with symptomatic treatment for symptoms and continued outpatient follow-up.  Do recommend considering follow-up with gastroenterology for persistent mild lipase elevation in the setting of gallbladder with some stones or sludge but not acutely appearing to be cholecystitis.  Will have the patient initiate omeprazole for upper abdominal discomfort this been persistent and sometimes worse with eating with nausea and bloating.  CT identified a possible mild disc protrusion in the lumbar spine which could be associated with the patient's right groin pain with no other etiology identified.  I recommend trying tramadol and discussed with PCP or follow-up with orthopedics.  With a number of various symptoms and mild abnormalities have advised the patient he must return if he has any concerning symptoms especially fever, confusion, increasing weakness or any other new symptoms.  Patient and wife voiced understanding and plan for follow-up care and return if needed.       Final Clinical Impression(s) / ED Diagnoses Final diagnoses:  Pain of upper abdomen  Right groin pain  Malaise and fatigue  Dehydration  Abnormal serum level of lipase    Rx / DC Orders ED Discharge  Orders          Ordered    ondansetron (ZOFRAN-ODT) 4 MG disintegrating tablet  Every 4 hours PRN        01/19/23 1529    omeprazole (PRILOSEC) 20 MG capsule  Daily        01/19/23 1529    traMADol (ULTRAM) 50 MG tablet        01/19/23 1534              Arby Barrette, MD 01/19/23 1548

## 2023-01-19 NOTE — Assessment & Plan Note (Signed)
Needs INR check

## 2023-01-19 NOTE — Discharge Instructions (Addendum)
1.  At this time the exact cause of your symptoms is still unclear.  I recommend you follow-up with your doctor in discuss getting a HIDA scan and possible referral to gastroenterology for a persistent but mildly elevated lipase with upper abdominal pain.  You may need additional testing such as an MRI. 2.  For stomach acid and reflux symptoms start omeprazole daily.  See if this medication alleviate symptoms for you.  It will take up to a week for it to have full effect so do not take it on an as-needed basis.  Is meant to be taken daily.  Take Zofran as needed for nausea so you can stay hydrated and eat small amounts of nutritious food. 3.  Your right groin pain may be due to a pinched nerve in your lower back.  Take pain medication, tramadol as needed. 4.  Return to emergency department immediately if you have new worsening or concerning symptoms.  Return if you are getting generally weak and cannot stay hydrated, you get a fever you have worsening pain or other concerning changes.

## 2023-01-19 NOTE — Assessment & Plan Note (Signed)
With severe nausea. The gallstones are most likely to blame. I will need to order all new blood work including CMP, lipade and CBC to rule out sepsis/ gallstone pancreatitis. I will rx zofran PRN for the nausea and an urgent referral to general surgery. Pt looks acutely ill/ gray in the office and I advised that if his symptoms did not improve that he should go immediately to the ER. We will attempt to complete his work up as an outpatient per his request.

## 2023-01-19 NOTE — Telephone Encounter (Signed)
Assessing pt chart for contact regarding INR check. Pt has not returned call or msg made by coumadin clinic on several attempts.  Pt was in office to see provider on 5/6 for abdominal pain. INR checked at that apt was 4.0. Coumadin clinic was not notified of this result. Pt is currently in ER after contacting provider he saw on 5/6 reporting worsening symptoms. Will f/u with pt after he is d/c from the hospital.

## 2023-01-19 NOTE — Progress Notes (Signed)
Abdominal US acutally looks ok but he should still go to the ER since his pain has gotten worse.

## 2023-01-19 NOTE — ED Triage Notes (Signed)
Pt arrives to ED with c/o abdominal pain x2 weeks associated with fever with radiation to his lower back.

## 2023-01-20 ENCOUNTER — Other Ambulatory Visit: Payer: Self-pay

## 2023-01-20 ENCOUNTER — Inpatient Hospital Stay (HOSPITAL_COMMUNITY): Payer: Managed Care, Other (non HMO)

## 2023-01-20 ENCOUNTER — Inpatient Hospital Stay (HOSPITAL_COMMUNITY)
Admission: EM | Admit: 2023-01-20 | Discharge: 2023-01-27 | DRG: 469 | Disposition: A | Discharge status: Home Health | Payer: Managed Care, Other (non HMO) | Attending: Internal Medicine | Admitting: Internal Medicine

## 2023-01-20 ENCOUNTER — Other Ambulatory Visit (HOSPITAL_BASED_OUTPATIENT_CLINIC_OR_DEPARTMENT_OTHER): Payer: Self-pay

## 2023-01-20 ENCOUNTER — Encounter (HOSPITAL_COMMUNITY): Payer: Self-pay | Admitting: Emergency Medicine

## 2023-01-20 ENCOUNTER — Telehealth (HOSPITAL_BASED_OUTPATIENT_CLINIC_OR_DEPARTMENT_OTHER): Payer: Self-pay | Admitting: *Deleted

## 2023-01-20 DIAGNOSIS — K828 Other specified diseases of gallbladder: Secondary | ICD-10-CM | POA: Diagnosis present

## 2023-01-20 DIAGNOSIS — Z1152 Encounter for screening for COVID-19: Secondary | ICD-10-CM | POA: Diagnosis not present

## 2023-01-20 DIAGNOSIS — N529 Male erectile dysfunction, unspecified: Secondary | ICD-10-CM | POA: Diagnosis present

## 2023-01-20 DIAGNOSIS — I5030 Unspecified diastolic (congestive) heart failure: Secondary | ICD-10-CM | POA: Diagnosis not present

## 2023-01-20 DIAGNOSIS — M86251 Subacute osteomyelitis, right femur: Secondary | ICD-10-CM | POA: Diagnosis present

## 2023-01-20 DIAGNOSIS — Y838 Other surgical procedures as the cause of abnormal reaction of the patient, or of later complication, without mention of misadventure at the time of the procedure: Secondary | ICD-10-CM | POA: Diagnosis present

## 2023-01-20 DIAGNOSIS — B952 Enterococcus as the cause of diseases classified elsewhere: Secondary | ICD-10-CM

## 2023-01-20 DIAGNOSIS — T8459XA Infection and inflammatory reaction due to other internal joint prosthesis, initial encounter: Secondary | ICD-10-CM

## 2023-01-20 DIAGNOSIS — Z96651 Presence of right artificial knee joint: Secondary | ICD-10-CM

## 2023-01-20 DIAGNOSIS — Z7901 Long term (current) use of anticoagulants: Secondary | ICD-10-CM | POA: Diagnosis not present

## 2023-01-20 DIAGNOSIS — E871 Hypo-osmolality and hyponatremia: Secondary | ICD-10-CM | POA: Insufficient documentation

## 2023-01-20 DIAGNOSIS — A498 Other bacterial infections of unspecified site: Secondary | ICD-10-CM

## 2023-01-20 DIAGNOSIS — Y831 Surgical operation with implant of artificial internal device as the cause of abnormal reaction of the patient, or of later complication, without mention of misadventure at the time of the procedure: Secondary | ICD-10-CM | POA: Diagnosis present

## 2023-01-20 DIAGNOSIS — A4181 Sepsis due to Enterococcus: Secondary | ICD-10-CM | POA: Diagnosis present

## 2023-01-20 DIAGNOSIS — E1169 Type 2 diabetes mellitus with other specified complication: Secondary | ICD-10-CM | POA: Diagnosis present

## 2023-01-20 DIAGNOSIS — Z8249 Family history of ischemic heart disease and other diseases of the circulatory system: Secondary | ICD-10-CM

## 2023-01-20 DIAGNOSIS — T826XXA Infection and inflammatory reaction due to cardiac valve prosthesis, initial encounter: Secondary | ICD-10-CM | POA: Diagnosis present

## 2023-01-20 DIAGNOSIS — I38 Endocarditis, valve unspecified: Secondary | ICD-10-CM | POA: Diagnosis present

## 2023-01-20 DIAGNOSIS — Z79899 Other long term (current) drug therapy: Secondary | ICD-10-CM

## 2023-01-20 DIAGNOSIS — R7881 Bacteremia: Secondary | ICD-10-CM

## 2023-01-20 DIAGNOSIS — I517 Cardiomegaly: Secondary | ICD-10-CM | POA: Diagnosis not present

## 2023-01-20 DIAGNOSIS — I2510 Atherosclerotic heart disease of native coronary artery without angina pectoris: Secondary | ICD-10-CM | POA: Diagnosis present

## 2023-01-20 DIAGNOSIS — Z87891 Personal history of nicotine dependence: Secondary | ICD-10-CM

## 2023-01-20 DIAGNOSIS — Z96659 Presence of unspecified artificial knee joint: Secondary | ICD-10-CM

## 2023-01-20 DIAGNOSIS — E1165 Type 2 diabetes mellitus with hyperglycemia: Secondary | ICD-10-CM | POA: Diagnosis present

## 2023-01-20 DIAGNOSIS — I081 Rheumatic disorders of both mitral and tricuspid valves: Secondary | ICD-10-CM | POA: Diagnosis not present

## 2023-01-20 DIAGNOSIS — Z955 Presence of coronary angioplasty implant and graft: Secondary | ICD-10-CM

## 2023-01-20 DIAGNOSIS — I10 Essential (primary) hypertension: Secondary | ICD-10-CM | POA: Diagnosis present

## 2023-01-20 DIAGNOSIS — E119 Type 2 diabetes mellitus without complications: Secondary | ICD-10-CM | POA: Diagnosis not present

## 2023-01-20 DIAGNOSIS — E781 Pure hyperglyceridemia: Secondary | ICD-10-CM | POA: Diagnosis present

## 2023-01-20 DIAGNOSIS — Z7984 Long term (current) use of oral hypoglycemic drugs: Secondary | ICD-10-CM

## 2023-01-20 DIAGNOSIS — T8453XA Infection and inflammatory reaction due to internal right knee prosthesis, initial encounter: Principal | ICD-10-CM | POA: Diagnosis present

## 2023-01-20 DIAGNOSIS — T826XXD Infection and inflammatory reaction due to cardiac valve prosthesis, subsequent encounter: Secondary | ICD-10-CM | POA: Diagnosis not present

## 2023-01-20 DIAGNOSIS — R591 Generalized enlarged lymph nodes: Secondary | ICD-10-CM | POA: Diagnosis present

## 2023-01-20 DIAGNOSIS — E785 Hyperlipidemia, unspecified: Secondary | ICD-10-CM | POA: Diagnosis present

## 2023-01-20 DIAGNOSIS — R109 Unspecified abdominal pain: Secondary | ICD-10-CM | POA: Diagnosis present

## 2023-01-20 DIAGNOSIS — E860 Dehydration: Secondary | ICD-10-CM | POA: Diagnosis present

## 2023-01-20 DIAGNOSIS — I330 Acute and subacute infective endocarditis: Secondary | ICD-10-CM | POA: Diagnosis present

## 2023-01-20 DIAGNOSIS — T847XXA Infection and inflammatory reaction due to other internal orthopedic prosthetic devices, implants and grafts, initial encounter: Principal | ICD-10-CM

## 2023-01-20 DIAGNOSIS — K8020 Calculus of gallbladder without cholecystitis without obstruction: Secondary | ICD-10-CM | POA: Diagnosis present

## 2023-01-20 DIAGNOSIS — I340 Nonrheumatic mitral (valve) insufficiency: Secondary | ICD-10-CM | POA: Diagnosis not present

## 2023-01-20 DIAGNOSIS — T8459XS Infection and inflammatory reaction due to other internal joint prosthesis, sequela: Secondary | ICD-10-CM | POA: Diagnosis not present

## 2023-01-20 DIAGNOSIS — R748 Abnormal levels of other serum enzymes: Secondary | ICD-10-CM

## 2023-01-20 DIAGNOSIS — K760 Fatty (change of) liver, not elsewhere classified: Secondary | ICD-10-CM | POA: Diagnosis present

## 2023-01-20 DIAGNOSIS — I252 Old myocardial infarction: Secondary | ICD-10-CM | POA: Diagnosis not present

## 2023-01-20 DIAGNOSIS — M19011 Primary osteoarthritis, right shoulder: Secondary | ICD-10-CM | POA: Diagnosis present

## 2023-01-20 LAB — COMPREHENSIVE METABOLIC PANEL
ALT: 26 U/L (ref 0–44)
AST: 28 U/L (ref 15–41)
Albumin: 3 g/dL — ABNORMAL LOW (ref 3.5–5.0)
Alkaline Phosphatase: 41 U/L (ref 38–126)
Anion gap: 13 (ref 5–15)
BUN: 15 mg/dL (ref 8–23)
CO2: 21 mmol/L — ABNORMAL LOW (ref 22–32)
Calcium: 9.1 mg/dL (ref 8.9–10.3)
Chloride: 96 mmol/L — ABNORMAL LOW (ref 98–111)
Creatinine, Ser: 0.87 mg/dL (ref 0.61–1.24)
GFR, Estimated: >60 mL/min (ref >60)
Glucose, Bld: 134 mg/dL — ABNORMAL HIGH (ref 70–99)
Potassium: 3.7 mmol/L (ref 3.5–5.1)
Sodium: 130 mmol/L — ABNORMAL LOW (ref 135–145)
Total Bilirubin: 1.1 mg/dL (ref 0.3–1.2)
Total Protein: 6.2 g/dL — ABNORMAL LOW (ref 6.5–8.1)

## 2023-01-20 LAB — BLOOD CULTURE ID PANEL (REFLEXED) - BCID2

## 2023-01-20 LAB — ECHOCARDIOGRAM COMPLETE
AR max vel: 1.32 cm2
AV Area VTI: 1.24 cm2
AV Area mean vel: 1.26 cm2
AV Mean grad: 14.3 mmHg
AV Peak grad: 27.2 mmHg
Ao pk vel: 2.61 m/s
Area-P 1/2: 4.31 cm2
Calc EF: 57.6 %
Height: 67 in
MV VTI: 1.94 cm2
S' Lateral: 2.8 cm
Single Plane A2C EF: 52.8 %
Single Plane A4C EF: 61.4 %
Weight: 2726.65 oz

## 2023-01-20 LAB — CBC WITH DIFFERENTIAL/PLATELET
Abs Immature Granulocytes: 0.03 10*3/uL (ref 0.00–0.07)
Basophils Absolute: 0.1 10*3/uL (ref 0.0–0.1)
Basophils Relative: 1 %
Eosinophils Absolute: 0 10*3/uL (ref 0.0–0.5)
Eosinophils Relative: 0 %
HCT: 34.3 % — ABNORMAL LOW (ref 39.0–52.0)
Hemoglobin: 11.9 g/dL — ABNORMAL LOW (ref 13.0–17.0)
Immature Granulocytes: 0 %
Lymphocytes Relative: 8 %
Lymphs Abs: 0.8 10*3/uL (ref 0.7–4.0)
MCH: 29.2 pg (ref 26.0–34.0)
MCHC: 34.7 g/dL (ref 30.0–36.0)
MCV: 84.1 fL (ref 80.0–100.0)
Monocytes Absolute: 1.1 10*3/uL — ABNORMAL HIGH (ref 0.1–1.0)
Monocytes Relative: 12 %
Neutro Abs: 7.5 10*3/uL (ref 1.7–7.7)
Neutrophils Relative %: 79 %
Platelets: 234 10*3/uL (ref 150–400)
RBC: 4.08 MIL/uL — ABNORMAL LOW (ref 4.22–5.81)
RDW: 13 % (ref 11.5–15.5)
WBC: 9.5 10*3/uL (ref 4.0–10.5)
nRBC: 0 % (ref 0.0–0.2)

## 2023-01-20 LAB — PROTIME-INR
INR: 1.8 — ABNORMAL HIGH (ref 0.8–1.2)
Prothrombin Time: 21 seconds — ABNORMAL HIGH (ref 11.4–15.2)

## 2023-01-20 LAB — CULTURE, BLOOD (ROUTINE X 2)

## 2023-01-20 LAB — SYNOVIAL CELL COUNT + DIFF, W/ CRYSTALS
Crystals, Fluid: NONE SEEN
Eosinophils-Synovial: 0 % (ref 0–1)
Lymphocytes-Synovial Fld: 10 % (ref 0–20)
Monocyte-Macrophage-Synovial Fluid: 14 % — ABNORMAL LOW (ref 50–90)
Neutrophil, Synovial: 76 % — ABNORMAL HIGH (ref 0–25)
WBC, Synovial: 54250 /mm3 — ABNORMAL HIGH (ref 0–200)

## 2023-01-20 LAB — GLUCOSE, CAPILLARY
Glucose-Capillary: 143 mg/dL — ABNORMAL HIGH (ref 70–99)
Glucose-Capillary: 114 mg/dL — ABNORMAL HIGH (ref 70–99)
Glucose-Capillary: 148 mg/dL — ABNORMAL HIGH (ref 70–99)

## 2023-01-20 LAB — LIPASE, BLOOD: Lipase: 115 U/L — ABNORMAL HIGH (ref 11–51)

## 2023-01-20 MED ORDER — ROSUVASTATIN CALCIUM 20 MG PO TABS
20.0000 mg | ORAL_TABLET | Freq: Every day | ORAL | Status: DC
  Administered 2023-01-20 – 2023-01-26 (×7): 20 mg via ORAL
  Filled 2023-01-20 (×7): qty 1

## 2023-01-20 MED ORDER — ONDANSETRON HCL 4 MG/2ML IJ SOLN: 4.0000 mg | Freq: Once | INTRAMUSCULAR | Status: DC

## 2023-01-20 MED ORDER — ONDANSETRON HCL 4 MG PO TABS: 4.0000 mg | ORAL_TABLET | Freq: Four times a day (QID) | ORAL | Status: DC | PRN

## 2023-01-20 MED ORDER — CARVEDILOL 3.125 MG PO TABS
3.1250 mg | ORAL_TABLET | Freq: Two times a day (BID) | ORAL | Status: DC
  Administered 2023-01-20 – 2023-01-27 (×14): 3.125 mg via ORAL
  Filled 2023-01-20 (×15): qty 1

## 2023-01-20 MED ORDER — HYDRALAZINE HCL 20 MG/ML IJ SOLN: 5.0000 mg | INTRAMUSCULAR | Status: DC | PRN

## 2023-01-20 MED ORDER — LACTATED RINGERS IV SOLN: INTRAVENOUS | Status: DC

## 2023-01-20 MED ORDER — ACETAMINOPHEN 325 MG PO TABS
650.0000 mg | ORAL_TABLET | Freq: Four times a day (QID) | ORAL | Status: DC | PRN
  Administered 2023-01-25 (×2): 650 mg via ORAL
  Filled 2023-01-20 (×3): qty 2

## 2023-01-20 MED ORDER — SODIUM CHLORIDE 0.9 % IV BOLUS
1000.0000 mL | Freq: Once | INTRAVENOUS | Status: AC
  Administered 2023-01-20: 1000 mL via INTRAVENOUS

## 2023-01-20 MED ORDER — WARFARIN SODIUM 7.5 MG PO TABS
15.0000 mg | ORAL_TABLET | Freq: Once | ORAL | Status: AC
  Administered 2023-01-20: 15 mg via ORAL
  Filled 2023-01-20: qty 2

## 2023-01-20 MED ORDER — MORPHINE SULFATE (PF) 4 MG/ML IV SOLN: 4.0000 mg | INTRAVENOUS | Status: DC | PRN

## 2023-01-20 MED ORDER — SODIUM CHLORIDE 0.9 % IV SOLN
2.0000 g | Freq: Two times a day (BID) | INTRAVENOUS | Status: DC
  Administered 2023-01-20 – 2023-01-27 (×15): 2 g via INTRAVENOUS
  Filled 2023-01-20 (×15): qty 20

## 2023-01-20 MED ORDER — MORPHINE SULFATE (PF) 2 MG/ML IV SOLN: 2.0000 mg | INTRAVENOUS | Status: DC | PRN

## 2023-01-20 MED ORDER — WARFARIN - PHARMACIST DOSING INPATIENT: Freq: Every day | Status: DC

## 2023-01-20 MED ORDER — INSULIN ASPART 100 UNIT/ML IJ SOLN
0.0000 [IU] | Freq: Three times a day (TID) | INTRAMUSCULAR | Status: DC
  Administered 2023-01-22: 5 [IU] via SUBCUTANEOUS
  Administered 2023-01-22: 2 [IU] via SUBCUTANEOUS
  Administered 2023-01-22: 3 [IU] via SUBCUTANEOUS
  Administered 2023-01-23: 5 [IU] via SUBCUTANEOUS
  Administered 2023-01-23: 2 [IU] via SUBCUTANEOUS
  Administered 2023-01-23 – 2023-01-24 (×2): 3 [IU] via SUBCUTANEOUS
  Administered 2023-01-24: 2 [IU] via SUBCUTANEOUS
  Administered 2023-01-25: 3 [IU] via SUBCUTANEOUS
  Administered 2023-01-25: 2 [IU] via SUBCUTANEOUS
  Administered 2023-01-25 – 2023-01-26 (×2): 5 [IU] via SUBCUTANEOUS
  Administered 2023-01-26 (×2): 3 [IU] via SUBCUTANEOUS
  Administered 2023-01-27 (×2): 8 [IU] via SUBCUTANEOUS

## 2023-01-20 MED ORDER — INSULIN ASPART 100 UNIT/ML IJ SOLN
0.0000 [IU] | Freq: Every day | INTRAMUSCULAR | Status: DC
  Administered 2023-01-24: 4 [IU] via SUBCUTANEOUS
  Administered 2023-01-26: 2 [IU] via SUBCUTANEOUS

## 2023-01-20 MED ORDER — ACETAMINOPHEN 650 MG RE SUPP: 650.0000 mg | Freq: Four times a day (QID) | RECTAL | Status: DC | PRN

## 2023-01-20 MED ORDER — SODIUM CHLORIDE 0.9 % IV SOLN
2.0000 g | INTRAVENOUS | Status: DC
  Administered 2023-01-20 – 2023-01-27 (×41): 2 g via INTRAVENOUS
  Filled 2023-01-20 (×49): qty 2000

## 2023-01-20 MED ORDER — BUPIVACAINE HCL (PF) 0.5 % IJ SOLN
10.0000 mL | Freq: Once | INTRAMUSCULAR | Status: AC
  Administered 2023-01-20: 10 mL
  Filled 2023-01-20: qty 10

## 2023-01-20 MED ORDER — ONDANSETRON HCL 4 MG/2ML IJ SOLN: 4.0000 mg | Freq: Four times a day (QID) | INTRAMUSCULAR | Status: DC | PRN

## 2023-01-20 MED ORDER — PIPERACILLIN-TAZOBACTAM 3.375 G IVPB 30 MIN
3.3750 g | Freq: Once | INTRAVENOUS | Status: AC
  Administered 2023-01-20: 3.375 g via INTRAVENOUS
  Filled 2023-01-20: qty 50

## 2023-01-20 MED ORDER — OXYCODONE HCL 5 MG PO TABS
5.0000 mg | ORAL_TABLET | ORAL | Status: DC | PRN
  Administered 2023-01-23: 5 mg via ORAL
  Filled 2023-01-20: qty 1

## 2023-01-20 MED ORDER — GEMFIBROZIL 600 MG PO TABS
600.0000 mg | ORAL_TABLET | Freq: Two times a day (BID) | ORAL | Status: DC
  Administered 2023-01-20 – 2023-01-27 (×15): 600 mg via ORAL
  Filled 2023-01-20 (×17): qty 1

## 2023-01-20 NOTE — ED Notes (Signed)
ED TO INPATIENT HANDOFF REPORT  ED Nurse Name and Phone #: Treniya Lobb [phone]  S Name/Age/Gender Dennis Bates 62 y.o. male Room/Bed: 034C/034C  Code Status   Code Status: Full Code  Home/SNF/Other Home Patient oriented to: self, place, time, and situation Is this baseline? Yes   Triage Complete: Triage complete  Chief Complaint Bacteremia [R78.81] Enterococcus faecalis infection [A49.8]  Triage Note Pt in with abdominal pain x 2 weeks, severe HA tonight pt just seen at [facility] last night, had +blood cx's and told to come to nearest ED. Hypotensive during triage 76/64   Allergies No Known Allergies  Level of Care/Admitting Diagnosis ED Disposition     ED Disposition  Admit   Condition  --   Comment  Hospital Area: [facility] [[id number]]  Level of Care: Med-Surg [[id number]]  May admit patient to [facility] or [facility] if equivalent level of care is available:: No  Covid Evaluation: Asymptomatic - no recent exposure (last 10 days) testing not required  Diagnosis: Enterococcus faecalis infection [811914]  Admitting Physician: Jonah Blue [[id number]]  Attending Physician: Jonah Blue [[id number]]  Certification:: I certify this patient will need inpatient services for at least 2 midnights  Estimated Length of Stay: 4          B Medical/Surgery History Past Medical History:  Diagnosis Date   Aortic stenosis due to bicuspid aortic valve 09/2014   Severe aortic stenosis of bicuspid valve - s/pAVR with [place] ( 09/2015)   Bell's palsy    LAST EPISODE 09-2015   CAD S/P percutaneous coronary angioplasty 09/20/2014   a. inferolat STEMI ->> LHC-Angio: Proximal/ostial LAD 20%, OM2 99% -->> PCI: 3mm x 16 mm Promus Premier DES to the OM2.(~3.5 mm)   Diabetes mellitus type 2, controlled, with complications (HCC)    History of mechanical aortic valve replacement 10/09/2015   after 1 yr DAPT for Inferolateral STEMI. (Dr. Laneta Simmers)   Hyperlipidemia    Hypertension     Hypertriglyceridemia    Shoulder arthritis 07/11/2020   Rt Severe GH DJD xray Sep 2021   ST elevation myocardial infarction (STEMI) of inferior wall (HCC) 09/20/2014   99 % occluded Cx-OM2 Promus DES 3.0 mm x 16 mm (3.5 mm)   Past Surgical History:  Procedure Laterality Date   BENTALL PROCEDURE N/A 10/09/2015   Procedure: BENTALL PROCEDURE (using a St Jude mechanical valve, size 23);  Surgeon: Alleen Borne, MD;  Location: [facility] OR;  Service: Open Heart Surgery;  Laterality: N/A;  CIRC ARRESTNEEDS RIGHT RADIAL A-LINE   COLONOSCOPY WITH PROPOFOL N/A 01/21/2020   Procedure: COLONOSCOPY WITH PROPOFOL;  Surgeon: Lynann Bologna, MD;  Location: [facility] ENDOSCOPY;  Service: Endoscopy;  Laterality: N/A;   ENTEROSCOPY N/A 01/24/2020   Procedure: ENTEROSCOPY;  Surgeon: Sherrilyn Rist, MD;  Location: [facility] ENDOSCOPY;  Service: Gastroenterology;  Laterality: N/A;   ESOPHAGOGASTRODUODENOSCOPY (EGD) WITH PROPOFOL N/A 01/21/2020   Procedure: ESOPHAGOGASTRODUODENOSCOPY (EGD) WITH PROPOFOL;  Surgeon: Lynann Bologna, MD;  Location: [facility] ENDOSCOPY;  Service: Endoscopy;  Laterality: N/A;   GIVENS CAPSULE STUDY N/A 01/22/2020   Procedure: GIVENS CAPSULE STUDY;  Surgeon: Sherrilyn Rist, MD;  Location: [facility] ENDOSCOPY;  Service: Gastroenterology;  Laterality: N/A;   KNEE ARTHROSCOPY W/ ACL RECONSTRUCTION Right    90's; "2 ATHROSCOPIC , 2 REBUILDS"   LEFT HEART CATHETERIZATION WITH CORONARY ANGIOGRAM N/A 09/20/2014   Procedure: LEFT HEART CATHETERIZATION WITH CORONARY ANGIOGRAM;  Surgeon: Marykay Lex, MD;  Location: [facility] CATH LAB;  Proximal/ostial LAD 20%,  OM2 99% -->> aspiration thrombectomy and DES PCI:    PERCUTANEOUS CORONARY STENT INTERVENTION (PCI-S)  09/20/2014   Aspiration thrombectomy and DES PCI- OM 2(m-dCx): Promus Premier DES 3.0 mm x 16 mm (3.5 mm)   RIGHT/LEFT HEART CATH AND CORONARY ANGIOGRAPHY  09/2015   Dr. Nicki Guadalajara: Widely patent OM 2 stent.  Mild disease in RCA and LAD.Relatively normal right heart cath  pressures.  Normal cardiac output.   TEE WITHOUT CARDIOVERSION N/A 10/09/2015   Procedure: TRANSESOPHAGEAL ECHOCARDIOGRAM (TEE);  Surgeon: Alleen Borne, MD;  Location: [facility] OR;  Service: Open Heart Surgery;  Laterality: N/A;   TOTAL KNEE ARTHROPLASTY Right 12/09/2017   Procedure: RIGHT TOTAL KNEE ARTHROPLASTY REMOVAL OF HARDWARE RIGHT KNEE;  Surgeon: Jodi Geralds, MD;  Location: [facility] ORS;  Service: Orthopedics;  Laterality: Right;   TRANSTHORACIC ECHOCARDIOGRAM  02/2016   Post AVR: mechanical: AVR without obstuction. LVEF improved to 65-70%.   TRANSTHORACIC ECHOCARDIOGRAM  09/2014, 10/2014   Probable bicuspid aortic valve: a. mod-sev by 2D ECHO 09/2014. AVA 0.9cm^2; b) 10/2014 Echo:. Severe AS Mn-Pk Gradient 41-71 mmHg, AVA ~0.79 cm2, EF 60-65%   TRANSTHORACIC ECHOCARDIOGRAM  12/2019    Normal WM. Gr 2 DD. Mild LA dilation. 23 mm St. Jude Mechanical AoV well seated.  Trivial AI.      A IV Location/Drains/Wounds Patient Lines/Drains/Airways Status     Active Line/Drains/Airways     Name Placement date Placement time Site Days   Peripheral IV 01/20/23 18 G 1.16" Anterior;Right Forearm 01/20/23  0417  Forearm  less than 1            Intake/Output Last 24 hours  Intake/Output Summary (Last 24 hours) at 01/20/2023 1043 Last data filed at 01/20/2023 0549 Gross per 24 hour  Intake 1050 ml  Output --  Net 1050 ml    Labs/Imaging Results for orders placed or performed during the hospital encounter of 01/20/23 (from the past 48 hour(s))  Lipase, blood     Status: Abnormal   Collection Time: 01/20/23  4:18 AM  Result Value Ref Range   Lipase 115 (H) 11 - 51 U/L    Comment: Performed at [facility], [address] [place], [place] [address]  Comprehensive metabolic panel     Status: Abnormal   Collection Time: 01/20/23  4:18 AM  Result Value Ref Range   Sodium 130 (L) 135 - 145 mmol/L   Potassium 3.7 3.5 - 5.1 mmol/L   Chloride 96 (L) 98 - 111 mmol/L   CO2 21 (L) 22 - 32 mmol/L    Glucose, Bld 134 (H) 70 - 99 mg/dL    Comment: Glucose reference range applies only to samples taken after fasting for at least 8 hours.   BUN 15 8 - 23 mg/dL   Creatinine, Ser 3.47 0.61 - 1.24 mg/dL   Calcium 9.1 8.9 - 42.5 mg/dL   Total Protein 6.2 (L) 6.5 - 8.1 g/dL   Albumin 3.0 (L) 3.5 - 5.0 g/dL   AST 28 15 - 41 U/L   ALT 26 0 - 44 U/L   Alkaline Phosphatase 41 38 - 126 U/L   Total Bilirubin 1.1 0.3 - 1.2 mg/dL   GFR, Estimated >95 >63 mL/min    Comment: (NOTE) Calculated using the CKD-EPI Creatinine Equation (2021)    Anion gap 13 5 - 15    Comment: Performed at [facility], [address], [place], [place] [address]  CBC with Differential  Status: Abnormal   Collection Time: 01/20/23  4:18 AM  Result Value Ref Range   WBC 9.5 4.0 - 10.5 K/uL   RBC 4.08 (L) 4.22 - 5.81 MIL/uL   Hemoglobin 11.9 (L) 13.0 - 17.0 g/dL   HCT 16.1 (L) 09.6 - 04.5 %   MCV 84.1 80.0 - 100.0 fL   MCH 29.2 26.0 - 34.0 pg   MCHC 34.7 30.0 - 36.0 g/dL   RDW 40.9 81.1 - 91.4 %   Platelets 234 150 - 400 K/uL   nRBC 0.0 0.0 - 0.2 %   Neutrophils Relative % 79 %   Neutro Abs 7.5 1.7 - 7.7 K/uL   Lymphocytes Relative 8 %   Lymphs Abs 0.8 0.7 - 4.0 K/uL   Monocytes Relative 12 %   Monocytes Absolute 1.1 (H) 0.1 - 1.0 K/uL   Eosinophils Relative 0 %   Eosinophils Absolute 0.0 0.0 - 0.5 K/uL   Basophils Relative 1 %   Basophils Absolute 0.1 0.0 - 0.1 K/uL   Immature Granulocytes 0 %   Abs Immature Granulocytes 0.03 0.00 - 0.07 K/uL    Comment: Performed at [facility], [address], [place], [place] [address]  Protime-INR     Status: Abnormal   Collection Time: 01/20/23  4:18 AM  Result Value Ref Range   Prothrombin Time 21.0 (H) 11.4 - 15.2 seconds   INR 1.8 (H) 0.8 - 1.2    Comment: (NOTE) INR goal varies based on device and disease states. Performed at [facility], [address] [place], [place] [address]    CT CHEST ABDOMEN PELVIS W CONTRAST  Result Date:  01/19/2023 CLINICAL DATA:  Sepsis.  Abdominal pain for 2 weeks with fever. EXAM: CT CHEST, ABDOMEN, AND PELVIS WITH CONTRAST TECHNIQUE: Multidetector CT imaging of the chest, abdomen and pelvis was performed following the standard protocol during bolus administration of intravenous contrast. RADIATION DOSE REDUCTION: This exam was performed according to the departmental dose-optimization program which includes automated exposure control, adjustment of the mA and/or kV according to patient size and/or use of iterative reconstruction technique. CONTRAST:  85mL OMNIPAQUE IOHEXOL 300 MG/ML  SOLN COMPARISON:  CTA abdomen and pelvis 01/21/2020. Chest CTA 09/24/2015 FINDINGS: CT CHEST FINDINGS Cardiovascular: Heart is nonenlarged. Trace pericardial fluid. Status post median sternotomy with prosthetic aortic valve. Also graft material along the ascending thoracic aorta. Mediastinum/Nodes: Normal caliber thoracic esophagus. Esophagus is mildly patulous. Preserved thyroid gland. No specific abnormal lymph node enlargement identified in the axillary region, hilum or mediastinum. Lungs/Pleura: There is some linear opacity seen along bases likely scar or atelectasis. No consolidation, pneumothorax or effusion. Mild breathing motion. Musculoskeletal: Scattered degenerative changes of the thoracic spine. CT ABDOMEN PELVIS FINDINGS Hepatobiliary: Stones in the nondilated gallbladder. No enhancing liver mass. Patent portal vein. Pancreas: Unremarkable. No pancreatic ductal dilatation or surrounding inflammatory changes. Spleen: Normal in size without focal abnormality. Adrenals/Urinary Tract: The adrenal glands are preserved. No enhancing renal mass or collecting system dilatation. Nonspecific perinephric stranding. Preserved contours of the urinary bladder but the bladder wall slightly thickened. Please correlate with any symptoms. Stomach/Bowel: On this non oral contrast exam, the large bowel has a normal course and caliber with  scattered stool. Normal appendix extends medial to the cecum in the right lower quadrant. Second portion duodenal diverticula. Third portion duodenal diverticula also seen. Small bowel overall is nondilated. Vascular/Lymphatic: Aortic atherosclerosis. No enlarged abdominal or pelvic lymph nodes. Retroaortic left renal vein. Reproductive: Prostate is unremarkable. Other:  No free air or free fluid. Musculoskeletal: Scattered degenerative changes of the spine and pelvis. There are some areas of canal stenosis along the lower lumbar spine with posterior disc bulging and osteophyte formation. Transitional lumbosacral segment. IMPRESSION: No consolidation, pneumothorax or effusion. 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 No bowel obstruction, free air or free fluid. Normal appendix. Scattered stool. Gallstones in the nondilated gallbladder. Fatty liver infiltration. Slight wall thickening of the urinary bladder, nonspecific. Please correlate with any symptoms of a subtle cystitis. Electronically Signed   By: Karen Kays M.D.   On: 01/19/2023 10:18   CT Head Wo Contrast  Result Date: 01/19/2023 CLINICAL DATA:  Mental status change for 2 weeks. EXAM: CT HEAD WITHOUT CONTRAST TECHNIQUE: Contiguous axial images were obtained from the base of the skull through the vertex without intravenous contrast. RADIATION DOSE REDUCTION: This exam was performed according to the departmental dose-optimization program which includes automated exposure control, adjustment of the mA and/or kV according to patient size and/or use of iterative reconstruction technique. COMPARISON:  Head CT 09/14/2013 FINDINGS: Brain: Small low densities in the bilateral cerebellum, convincing even when accounting for motion artifact. No evidence of supratentorial infarct. No hemorrhage, hydrocephalus, collection. Vascular: No hyperdense vessel or unexpected calcification. Skull: Normal. Negative for fracture or focal  lesion. Sinuses/Orbits: No acute finding. 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 Motion degraded head CT especially at the ventral brain. Electronically Signed   By: Tiburcio Pea M.D.   On: 01/19/2023 10:08   US Abdomen Complete  Result Date: 01/19/2023 CLINICAL DATA:  Cholelithiasis, elevated lipase, question obstruction EXAM: ABDOMEN ULTRASOUND COMPLETE COMPARISON:  03/19/2022 FINDINGS: Gallbladder: Multiple small shadowing calculi in gallbladder up to 5 mm diameter. Associated sludge. No gallbladder wall thickening, pericholecystic fluid, or sonographic Murphy sign. Common bile duct: Diameter: 6 mm, normal for age Liver: Heterogeneously increased echogenicity, likely fatty infiltration, though can be seen with cirrhosis and some infiltrative disorders. No mass or nodularity. Portal vein is patent on color Doppler imaging with normal direction of blood flow towards the liver. IVC: Normal appearance Pancreas: Normal appearance.  No mass or ductal dilatation. Spleen: Normal echogenicity. 13.3 cm greatest diameter. No focal abnormalities. Right Kidney: Length: 12.8 cm. Normal cortical thickness and echogenicity. Tiny cyst inferior pole 9 mm diameter, simple features; no follow-up imaging recommended. No additional mass or hydronephrosis. Left Kidney: Length: 12.6 cm. Normal morphology without mass or hydronephrosis. Abdominal aorta: Normal caliber Other findings: No free fluid IMPRESSION: Cholelithiasis and sludge within gallbladder without evidence of acute cholecystitis or biliary dilatation. Probable fatty infiltration of liver as above. Remainder of exam unremarkable. Electronically Signed   By: Ulyses Southward M.D.   On: 01/19/2023 09:02    Pending Labs Unresulted Labs (From admission, onward)     Start     Ordered   01/21/23 0500  Basic metabolic panel  Tomorrow morning,   R        01/20/23 0949   01/21/23 0500  CBC  Tomorrow morning,   R        01/20/23 0949   01/20/23  0948  HIV Antibody (routine testing w rflx)  (HIV Antibody (Routine testing w reflex) panel)  Once,   R        01/20/23 0949            Vitals/Pain Today's Vitals   01/20/23 0647 01/20/23 0723 01/20/23 0800 01/20/23 0900  BP:  110/66 105/65 120/70  Pulse:  77 75 75  Resp:  20 17 20   Temp: 98.8 F (37.1 C) 99.3 F (37.4 C)    TempSrc: Oral Oral    SpO2:  100% 95% 95%  Weight:      Height:        Isolation Precautions No active isolations  Medications Medications  ondansetron (ZOFRAN) injection 4 mg (4 mg Intravenous Not Given 01/20/23 0431)  ampicillin (OMNIPEN) 2 g in sodium chloride 0.9 % 100 mL IVPB (0 g Intravenous Stopped 01/20/23 1041)  cefTRIAXone (ROCEPHIN) 2 g in sodium chloride 0.9 % 100 mL IVPB (has no administration in time range)  carvedilol (COREG) tablet 3.125 mg (has no administration in time range)  gemfibrozil (LOPID) tablet 600 mg (has no administration in time range)  rosuvastatin (CRESTOR) tablet 20 mg (has no administration in time range)  insulin aspart (novoLOG) injection 0-15 Units (has no administration in time range)  insulin aspart (novoLOG) injection 0-5 Units (has no administration in time range)  lactated ringers infusion ( Intravenous New Bag/Given 01/20/23 1042)  acetaminophen (TYLENOL) tablet 650 mg (has no administration in time range)    Or  acetaminophen (TYLENOL) suppository 650 mg (has no administration in time range)  oxyCODONE (Oxy IR/ROXICODONE) immediate release tablet 5 mg (has no administration in time range)  morphine (PF) 2 MG/ML injection 2 mg (has no administration in time range)  ondansetron (ZOFRAN) tablet 4 mg (has no administration in time range)    Or  ondansetron (ZOFRAN) injection 4 mg (has no administration in time range)  hydrALAZINE (APRESOLINE) injection 5 mg (has no administration in time range)  sodium chloride 0.9 % bolus 1,000 mL (0 mLs Intravenous Stopped 01/20/23 0549)  piperacillin-tazobactam (ZOSYN) IVPB 3.375  g (0 g Intravenous Stopped 01/20/23 0500)  bupivacaine(PF) (MARCAINE) 0.5 % injection 10 mL (10 mLs Infiltration Given 01/20/23 1015)    Mobility walks     Focused Assessments    R Recommendations: See Admitting Provider Note  Report given to:   Additional Notes:

## 2023-01-20 NOTE — ED Provider Notes (Signed)
[place] EMERGENCY DEPARTMENT AT [facility] Provider Note   CSN: [id number] Arrival date & time: 01/20/23  0250     History  Chief Complaint  Patient presents with   Abdominal Pain   Fever    EWEL IKNER is a 62 y.o. male.  The history is provided by the patient and the spouse.  Abdominal Pain Associated symptoms: fever   Fever He has history of hypertension, diabetes, hyperlipidemia, coronary artery disease, mechanical aortic valve replacement anticoagulated on warfarin and was told to come to the hospital because of a positive blood culture.  For the last 2 weeks, he has been having upper abdominal pain, subjective fever, chills, sweats.  There has been nausea but no vomiting.  He was seen in the emergency at [facility] yesterday and had extensive workup including CT scan and blood cultures.  He was called at home because of a positive blood culture.  Of note, abdominal pain is similar to what he had about a year ago which was diagnosed with biliary colic, but he never followed up with a general surgeon.   Home Medications Prior to Admission medications   Medication Sig Start Date End Date Taking? Authorizing Provider  acetaminophen (TYLENOL) 325 MG tablet Take 2 tablets (650 mg total) by mouth every 6 (six) hours as needed for mild pain. 10/17/15   Doree Fudge M, PA-C  carvedilol (COREG) 3.125 MG tablet TAKE 1 TABLET TWICE A DAY 01/04/23   Burchette, Elberta Fortis, MD  gemfibrozil (LOPID) 600 MG tablet TAKE 1 TABLET IN THE MORNING AND AT BEDTIME 01/04/23   Burchette, Elberta Fortis, MD  JARDIANCE 25 MG TABS tablet TAKE 1 TABLET DAILY BEFORE BREAKFAST 01/04/23   Burchette, Elberta Fortis, MD  metFORMIN (GLUCOPHAGE) 1000 MG tablet TAKE 1 TABLET TWICE A DAY WITH MEALS 01/04/23   Burchette, Elberta Fortis, MD  omeprazole (PRILOSEC) 20 MG capsule Take 1 capsule (20 mg total) by mouth daily. 01/19/23   Arby Barrette, MD  ondansetron (ZOFRAN) 4 MG tablet Take 1 tablet (4 mg total) by  mouth every 6 (six) hours as needed for nausea or vomiting. 01/17/23   Karie Georges, MD  ondansetron (ZOFRAN-ODT) 4 MG disintegrating tablet Take 1 tablet (4 mg total) by mouth every 4 (four) hours as needed for nausea or vomiting. 01/19/23   Arby Barrette, MD  rosuvastatin (CRESTOR) 20 MG tablet TAKE 1 TABLET DAILY AT BEDTIME 01/04/23   Burchette, Elberta Fortis, MD  tadalafil (CIALIS) 20 MG tablet Take 1 tablet every other day as needed for erectile dysfunction 05/15/21   Burchette, Elberta Fortis, MD  traMADol (ULTRAM) 50 MG tablet Take 1 to 2 tablets every 6 hours as needed for pain. 01/19/23   Arby Barrette, MD  ursodiol (ACTIGALL) 300 MG capsule Take 1 capsule (300 mg total) by mouth 2 (two) times daily. 01/17/23   Karie Georges, MD  warfarin (COUMADIN) 10 MG tablet TAKE 1 TABLET DAILY EXCEPT TAKE 1 1/2 TABLETS ON MONDAYS AND THURSDAYS, OR TAKE AS DIRECTED BY ANTICOAGULATION CLINIC 11/29/22   Burchette, Elberta Fortis, MD      Allergies    Patient has no known allergies.    Review of Systems   Review of Systems  Constitutional:  Positive for fever.  Gastrointestinal:  Positive for abdominal pain.  All other systems reviewed and are negative.   Physical Exam Updated Vital Signs BP 108/62   Pulse 74   Temp 98.6 F (37 C) (Oral)   Resp 14  Ht 5\' 7"  (1.702 m)   Wt 77.3 kg   SpO2 95%   BMI 26.69 kg/m  Physical Exam Vitals and nursing note reviewed.   62 year old male, resting comfortably and in no acute distress. Vital signs are normal. Oxygen saturation is 95%, which is normal. Head is normocephalic and atraumatic. PERRLA, EOMI. Oropharynx is clear. Neck is nontender and supple without adenopathy or JVD. Back is nontender and there is no CVA tenderness. Lungs are clear without rales, wheezes, or rhonchi. Chest is nontender. Heart has regular rate and rhythm with prominent prosthetic aortic valve click and a 2/6 soft systolic murmur. Abdomen is soft, flat, nontender. Extremities have no  cyanosis or edema, full range of motion is present. Skin is warm and dry without rash. Neurologic: Mental status is normal, cranial nerves are intact, moves all extremities equally.  ED Results / Procedures / Treatments   Labs (all labs ordered are listed, but only abnormal results are displayed) Labs Reviewed  LIPASE, BLOOD  COMPREHENSIVE METABOLIC PANEL  CBC WITH DIFFERENTIAL/PLATELET  PROTIME-INR    EKG None  Radiology CT CHEST ABDOMEN PELVIS W CONTRAST  Result Date: 01/19/2023 CLINICAL DATA:  Sepsis.  Abdominal pain for 2 weeks with fever. EXAM: CT CHEST, ABDOMEN, AND PELVIS WITH CONTRAST TECHNIQUE: Multidetector CT imaging of the chest, abdomen and pelvis was performed following the standard protocol during bolus administration of intravenous contrast. RADIATION DOSE REDUCTION: This exam was performed according to the departmental dose-optimization program which includes automated exposure control, adjustment of the mA and/or kV according to patient size and/or use of iterative reconstruction technique. CONTRAST:  85mL OMNIPAQUE IOHEXOL 300 MG/ML  SOLN COMPARISON:  CTA abdomen and pelvis 01/21/2020. Chest CTA 09/24/2015 FINDINGS: CT CHEST FINDINGS Cardiovascular: Heart is nonenlarged. Trace pericardial fluid. Status post median sternotomy with prosthetic aortic valve. Also graft material along the ascending thoracic aorta. Mediastinum/Nodes: Normal caliber thoracic esophagus. Esophagus is mildly patulous. Preserved thyroid gland. No specific abnormal lymph node enlargement identified in the axillary region, hilum or mediastinum. Lungs/Pleura: There is some linear opacity seen along bases likely scar or atelectasis. No consolidation, pneumothorax or effusion. Mild breathing motion. Musculoskeletal: Scattered degenerative changes of the thoracic spine. CT ABDOMEN PELVIS FINDINGS Hepatobiliary: Stones in the nondilated gallbladder. No enhancing liver mass. Patent portal vein. Pancreas:  Unremarkable. No pancreatic ductal dilatation or surrounding inflammatory changes. Spleen: Normal in size without focal abnormality. Adrenals/Urinary Tract: The adrenal glands are preserved. No enhancing renal mass or collecting system dilatation. Nonspecific perinephric stranding. Preserved contours of the urinary bladder but the bladder wall slightly thickened. Please correlate with any symptoms. Stomach/Bowel: On this non oral contrast exam, the large bowel has a normal course and caliber with scattered stool. Normal appendix extends medial to the cecum in the right lower quadrant. Second portion duodenal diverticula. Third portion duodenal diverticula also seen. Small bowel overall is nondilated. Vascular/Lymphatic: Aortic atherosclerosis. No enlarged abdominal or pelvic lymph nodes. Retroaortic left renal vein. Reproductive: Prostate is unremarkable. Other: No free air or free fluid. Musculoskeletal: Scattered degenerative changes of the spine and pelvis. There are some areas of canal stenosis along the lower lumbar spine with posterior disc bulging and osteophyte formation. Transitional lumbosacral segment. IMPRESSION: No consolidation, pneumothorax or effusion. 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 No bowel obstruction, free air or free fluid. Normal appendix. Scattered stool. Gallstones in the nondilated gallbladder. Fatty liver infiltration. Slight wall thickening of the urinary bladder, nonspecific. Please correlate with any symptoms of  a subtle cystitis. Electronically Signed   By: Karen Kays M.D.   On: 01/19/2023 10:18   CT Head Wo Contrast  Result Date: 01/19/2023 CLINICAL DATA:  Mental status change for 2 weeks. EXAM: CT HEAD WITHOUT CONTRAST TECHNIQUE: Contiguous axial images were obtained from the base of the skull through the vertex without intravenous contrast. RADIATION DOSE REDUCTION: This exam was performed according to the departmental  dose-optimization program which includes automated exposure control, adjustment of the mA and/or kV according to patient size and/or use of iterative reconstruction technique. COMPARISON:  Head CT 09/14/2013 FINDINGS: Brain: Small low densities in the bilateral cerebellum, convincing even when accounting for motion artifact. No evidence of supratentorial infarct. No hemorrhage, hydrocephalus, collection. Vascular: No hyperdense vessel or unexpected calcification. Skull: Normal. Negative for fracture or focal lesion. Sinuses/Orbits: No acute finding. 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 Motion degraded head CT especially at the ventral brain. Electronically Signed   By: Tiburcio Pea M.D.   On: 01/19/2023 10:08   US Abdomen Complete  Result Date: 01/19/2023 CLINICAL DATA:  Cholelithiasis, elevated lipase, question obstruction EXAM: ABDOMEN ULTRASOUND COMPLETE COMPARISON:  03/19/2022 FINDINGS: Gallbladder: Multiple small shadowing calculi in gallbladder up to 5 mm diameter. Associated sludge. No gallbladder wall thickening, pericholecystic fluid, or sonographic Murphy sign. Common bile duct: Diameter: 6 mm, normal for age Liver: Heterogeneously increased echogenicity, likely fatty infiltration, though can be seen with cirrhosis and some infiltrative disorders. No mass or nodularity. Portal vein is patent on color Doppler imaging with normal direction of blood flow towards the liver. IVC: Normal appearance Pancreas: Normal appearance.  No mass or ductal dilatation. Spleen: Normal echogenicity. 13.3 cm greatest diameter. No focal abnormalities. Right Kidney: Length: 12.8 cm. Normal cortical thickness and echogenicity. Tiny cyst inferior pole 9 mm diameter, simple features; no follow-up imaging recommended. No additional mass or hydronephrosis. Left Kidney: Length: 12.6 cm. Normal morphology without mass or hydronephrosis. Abdominal aorta: Normal caliber Other findings: No free fluid  IMPRESSION: Cholelithiasis and sludge within gallbladder without evidence of acute cholecystitis or biliary dilatation. Probable fatty infiltration of liver as above. Remainder of exam unremarkable. Electronically Signed   By: Ulyses Southward M.D.   On: 01/19/2023 09:02    Procedures Procedures  Cardiac monitor shows normal sinus rhythm, per my interpretation.  Medications Ordered in ED Medications  sodium chloride 0.9 % bolus 1,000 mL (has no administration in time range)  morphine (PF) 4 MG/ML injection 4 mg (has no administration in time range)  piperacillin-tazobactam (ZOSYN) IVPB 3.375 g (has no administration in time range)  ondansetron (ZOFRAN) injection 4 mg (has no administration in time range)    ED Course/ Medical Decision Making/ A&P                             Medical Decision Making Amount and/or Complexity of Data Reviewed Labs: ordered.  Risk Prescription drug management. Decision regarding hospitalization.   Positive blood culture inpatient with abdominal pain concerning for biliary colic.  I have reviewed her past records, and he had an emergency department visit on 03/18/2022 for biliary colic.  Outpatient abdominal ultrasound yesterday showed cholelithiasis and sludge without evidence of acute cholecystitis or biliary distention.  CT of chest/abdomen/pelvis yesterday showed gallstones and a nondilated gallbladder and no acute findings.  Laboratory workup yesterday showed elevated lipase of 166 but no evidence of pancreatitis on his CT scan.  Also had elevated random glucose level and mild hyponatremia with  normal WBC.  INR was subtherapeutic at 1.9 (goal in patient with mechanical prosthetic valves is INR 2.5-3.5).  Blood culture was positive for Enterococcus faecalis.  In the setting of a prosthetic aortic valve, this needs to be treated aggressively.  I have ordered intravenous piperacillin-tazobactam and I have ordered IV fluids.  I have ordered repeat laboratory testing  of CBC, comprehensive metabolic panel, lipase, INR.  He is complaining of nausea, I have ordered IV ondansetron.  ED will need to be admitted for IV antibiotics and will need to obtain echocardiogram to rule out endocarditis.  I have reviewed and interpreted his laboratory tests, and my interpretation is normal lactic acid, normal CBC, mild anemia which is new compared with yesterday, hyponatremia which is essentially unchanged, elevated lipase which has decreased from yesterday, normal WBC with normal differential, subtherapeutic INR.  I have discussed the case with Dr. Loney Loh of [facility], who agrees to admit the patient.  CRITICAL CARE Performed by: Dione Booze Total critical care time: 50 minutes Critical care time was exclusive of separately billable procedures and treating other patients. Critical care was necessary to treat or prevent imminent or life-threatening deterioration. Critical care was time spent personally by me on the following activities: development of treatment plan with patient and/or surrogate as well as nursing, discussions with consultants, evaluation of patient's response to treatment, examination of patient, obtaining history from patient or surrogate, ordering and performing treatments and interventions, ordering and review of laboratory studies, ordering and review of radiographic studies, pulse oximetry and re-evaluation of patient's condition.  Final Clinical Impression(s) / ED Diagnoses Final diagnoses:  Bacteremia due to Enterococcus  Elevated lipase  Hyponatremia  Anticoagulated on warfarin    Rx / DC Orders ED Discharge Orders     None         Dione Booze, MD 01/20/23 [phone]

## 2023-01-20 NOTE — H&P (Signed)
History and Physical    Patient: Dennis Bates:[id number] DOB: May 14, 1961 DOA: 01/20/2023 DOS: the patient was seen and examined on 01/20/2023 PCP: Kristian Covey, MD  Patient coming from: Home - lives with wife; NOK: Wife, [phone]   Chief Complaint: + blood cultures  HPI: Dennis Bates is a 62 y.o. male with medical history significant of s/p AVR on Coumadin, CAD s/p cath, DM, HTN, and HLD presenting with positive blood cultures.  He was seen yesterday at [facility] with abdominal pain x 2 weeks, poor PO intake, nausea.  He was fluid resuscitated with improved symptoms and so was discharged.  However, blood cultures came back positive and he was called to come to the ER, where presenting BP was 76/64.  About 2 weeks ago he started having abdominal pain, waxing and waning  He was having headaches as well  He felt bad, like back in July when he had a gallbladder attack but then it was forceful vomiting.  It subsided and so he never followed up for cholecystectomy.  By the end of last week, he really felt bad and was getting worse, very weak.  He wasn't able to think clearly, drove off the road a couple of times, felt like he was tilting over when standing at times.  He slept most of the weekend, felt terrible.  He went to his PCP Monday, had blood drawn, given "don't throw up meds" for periodic nausea.  Some diarrhea, none recently because he hasn't eaten.  His lower back had been squeezing in pain last week.   He went to [facility] thinking it was his gallbladder, needed cholecystectomy.   He "spent a long time there", got home about 6pm, ate 1/2 salad  They called him back at 0100 today to report positive blood cultures, told him to come to the ER.  Nightly fevers, subjective.  Sweats, chills, no energy.  Some urinary frequency, incontinence in the last couple of weeks.     ER Course:  Carryover, per Dr. Loney Loh:  Seen in [facility] ED yesterday for abdominal pain x 2 weeks, fever, and loss of  appetite.  Lipase was elevated.  He had CT of chest/abdomen/pelvis done which were negative for acute findings.  Ultrasound was showing cholelithiasis and sludge within the gallbladder without evidence of acute cholecystitis or biliary dilatation. He returns to the ED complaining of ongoing abdominal pain and severe headache.  Blood cultures drawn yesterday grew enterococcus faecalis.  Patient hypotensive with blood pressure 75/64 on arrival to the ED but afebrile.  Labs showing no leukocytosis, lipase 115, normal LFTs, INR 1.8.  CT head done yesterday was showing small age-indeterminate bilateral cerebellar infarcts. Patient was given Zosyn and 1 L IV fluids. BP improved.      Review of Systems: As mentioned in the history of present illness. All other systems reviewed and are negative. Past Medical History:  Diagnosis Date   Aortic stenosis due to bicuspid aortic valve 09/2014   Severe aortic stenosis of bicuspid valve - s/pAVR with Bentall ( 09/2015)   Bell's palsy    LAST EPISODE 09-2015   CAD S/P percutaneous coronary angioplasty 09/20/2014   a. inferolat STEMI ->> LHC-Angio: Proximal/ostial LAD 20%, OM2 99% -->> PCI: 3mm x 16 mm Promus Premier DES to the OM2.(~3.5 mm)   Diabetes mellitus type 2, controlled, with complications (HCC)    History of mechanical aortic valve replacement 10/09/2015   after 1 yr DAPT for Inferolateral STEMI. (Dr. Laneta Simmers)   Hyperlipidemia  Hypertension    Hypertriglyceridemia    Shoulder arthritis 07/11/2020   Rt Severe GH DJD xray Sep 2021   ST elevation myocardial infarction (STEMI) of inferior wall (HCC) 09/20/2014   99 % occluded Cx-OM2 Promus DES 3.0 mm x 16 mm (3.5 mm)   Past Surgical History:  Procedure Laterality Date   BENTALL PROCEDURE N/A 10/09/2015   Procedure: BENTALL PROCEDURE (using a St Jude mechanical valve, size 23);  Surgeon: Alleen Borne, MD;  Location: [facility] OR;  Service: Open Heart Surgery;  Laterality: N/A;  CIRC ARRESTNEEDS RIGHT RADIAL  A-LINE   COLONOSCOPY WITH PROPOFOL N/A 01/21/2020   Procedure: COLONOSCOPY WITH PROPOFOL;  Surgeon: Lynann Bologna, MD;  Location: [facility] ENDOSCOPY;  Service: Endoscopy;  Laterality: N/A;   ENTEROSCOPY N/A 01/24/2020   Procedure: ENTEROSCOPY;  Surgeon: Sherrilyn Rist, MD;  Location: [facility] ENDOSCOPY;  Service: Gastroenterology;  Laterality: N/A;   ESOPHAGOGASTRODUODENOSCOPY (EGD) WITH PROPOFOL N/A 01/21/2020   Procedure: ESOPHAGOGASTRODUODENOSCOPY (EGD) WITH PROPOFOL;  Surgeon: Lynann Bologna, MD;  Location: [facility] ENDOSCOPY;  Service: Endoscopy;  Laterality: N/A;   GIVENS CAPSULE STUDY N/A 01/22/2020   Procedure: GIVENS CAPSULE STUDY;  Surgeon: Sherrilyn Rist, MD;  Location: [facility] ENDOSCOPY;  Service: Gastroenterology;  Laterality: N/A;   KNEE ARTHROSCOPY W/ ACL RECONSTRUCTION Right    90's; "2 ATHROSCOPIC , 2 REBUILDS"   LEFT HEART CATHETERIZATION WITH CORONARY ANGIOGRAM N/A 09/20/2014   Procedure: LEFT HEART CATHETERIZATION WITH CORONARY ANGIOGRAM;  Surgeon: Marykay Lex, MD;  Location: [facility] CATH LAB;  Proximal/ostial LAD 20%, OM2 99% -->> aspiration thrombectomy and DES PCI:    PERCUTANEOUS CORONARY STENT INTERVENTION (PCI-S)  09/20/2014   Aspiration thrombectomy and DES PCI- OM 2(m-dCx): Promus Premier DES 3.0 mm x 16 mm (3.5 mm)   RIGHT/LEFT HEART CATH AND CORONARY ANGIOGRAPHY  09/2015   Dr. Nicki Guadalajara: Widely patent OM 2 stent.  Mild disease in RCA and LAD.Relatively normal right heart cath pressures.  Normal cardiac output.   TEE WITHOUT CARDIOVERSION N/A 10/09/2015   Procedure: TRANSESOPHAGEAL ECHOCARDIOGRAM (TEE);  Surgeon: Alleen Borne, MD;  Location: [facility] OR;  Service: Open Heart Surgery;  Laterality: N/A;   TOTAL KNEE ARTHROPLASTY Right 12/09/2017   Procedure: RIGHT TOTAL KNEE ARTHROPLASTY REMOVAL OF HARDWARE RIGHT KNEE;  Surgeon: Jodi Geralds, MD;  Location: [facility] ORS;  Service: Orthopedics;  Laterality: Right;   TRANSTHORACIC ECHOCARDIOGRAM  02/2016   Post AVR: mechanical: AVR without obstuction.  LVEF improved to 65-70%.   TRANSTHORACIC ECHOCARDIOGRAM  09/2014, 10/2014   Probable bicuspid aortic valve: a. mod-sev by 2D ECHO 09/2014. AVA 0.9cm^2; b) 10/2014 Echo:. Severe AS Mn-Pk Gradient 41-71 mmHg, AVA ~0.79 cm2, EF 60-65%   TRANSTHORACIC ECHOCARDIOGRAM  12/2019    Normal WM. Gr 2 DD. Mild LA dilation. 23 mm St. Jude Mechanical AoV well seated.  Trivial AI.    Social History:  reports that he has never smoked. He quit smokeless tobacco use about 37 years ago.  His smokeless tobacco use included chew. He reports current alcohol use. He reports that he does not use drugs.  No Known Allergies  Family History  Problem Relation Age of Onset   Heart disease Mother    CAD Neg Hx        per wife    Prior to Admission medications   Medication Sig Start Date End Date Taking? Authorizing Provider  acetaminophen (TYLENOL) 325 MG tablet Take 2 tablets (650 mg total) by mouth every 6 (six) hours as needed for mild pain.  10/17/15  Yes Joycelyn Man, Donielle M, PA-C  carvedilol (COREG) 3.125 MG tablet TAKE 1 TABLET TWICE A DAY 01/04/23  Yes Burchette, Elberta Fortis, MD  gemfibrozil (LOPID) 600 MG tablet TAKE 1 TABLET IN THE MORNING AND AT BEDTIME 01/04/23  Yes Burchette, Elberta Fortis, MD  JARDIANCE 25 MG TABS tablet TAKE 1 TABLET DAILY BEFORE BREAKFAST 01/04/23  Yes Burchette, Elberta Fortis, MD  metFORMIN (GLUCOPHAGE) 1000 MG tablet TAKE 1 TABLET TWICE A DAY WITH MEALS 01/04/23  Yes Burchette, Elberta Fortis, MD  rosuvastatin (CRESTOR) 20 MG tablet TAKE 1 TABLET DAILY AT BEDTIME Patient taking differently: Take 20 mg by mouth at bedtime. 01/04/23  Yes Burchette, Elberta Fortis, MD  tadalafil (CIALIS) 20 MG tablet Take 1 tablet every other day as needed for erectile dysfunction 05/15/21  Yes Burchette, Elberta Fortis, MD  warfarin (COUMADIN) 10 MG tablet TAKE 1 TABLET DAILY EXCEPT TAKE 1 1/2 TABLETS ON MONDAYS AND THURSDAYS, OR TAKE AS DIRECTED BY ANTICOAGULATION CLINIC Patient taking differently: Take 10-15 mg by mouth See admin instructions. TAKE  1 TABLET DAILY EXCEPT TAKE 1 1/2 TABLETS ON MONDAYS AND THURSDAYS, OR TAKE AS DIRECTED BY ANTICOAGULATION CLINIC 11/29/22  Yes Burchette, Elberta Fortis, MD  omeprazole (PRILOSEC) 20 MG capsule Take 1 capsule (20 mg total) by mouth daily. Patient not taking: Reported on 01/20/2023 01/19/23   Arby Barrette, MD  ondansetron (ZOFRAN) 4 MG tablet Take 1 tablet (4 mg total) by mouth every 6 (six) hours as needed for nausea or vomiting. Patient not taking: Reported on 01/20/2023 01/17/23   Karie Georges, MD  ondansetron (ZOFRAN-ODT) 4 MG disintegrating tablet Take 1 tablet (4 mg total) by mouth every 4 (four) hours as needed for nausea or vomiting. 01/19/23   Arby Barrette, MD  traMADol (ULTRAM) 50 MG tablet Take 1 to 2 tablets every 6 hours as needed for pain. 01/19/23   Arby Barrette, MD  ursodiol (ACTIGALL) 300 MG capsule Take 1 capsule (300 mg total) by mouth 2 (two) times daily. Patient not taking: Reported on 01/20/2023 01/17/23   Karie Georges, MD    Physical Exam: Vitals:   01/20/23 0900 01/20/23 1042 01/20/23 1100 01/20/23 1300  BP: 120/70  108/62   Pulse: 75  78 76  Resp: 20  18 16   Temp:  99 F (37.2 C)  (!) 97.3 F (36.3 C)  TempSrc:  Oral    SpO2: 95%  97% 95%  Weight:      Height:       General:  Appears calm and comfortable and is in NAD, mildly fatigued Eyes:  EOMI, normal lids, iris ENT:  grossly normal hearing, lips & tongue, mmm; appropriate dentition Neck:  no LAD, masses or thyromegaly Cardiovascular:  RRR, no m/r/g. No LE edema. Well-healed sternotomy scar. Respiratory:   CTA bilaterally with no wheezes/rales/rhonchi.  Normal respiratory effort. Abdomen:  soft, NT, ND Skin:  no rash or induration seen on limited exam Musculoskeletal:  R knee effusion without erythema Psychiatric:  grossly normal mood and affect, speech fluent and appropriate, AOx3 Neurologic:  CN 2-12 grossly intact, moves all extremities in coordinated fashion   Radiological Exams on  Admission: Independently reviewed - see discussion in A/P where applicable  ECHOCARDIOGRAM COMPLETE  Result Date: 01/20/2023    ECHOCARDIOGRAM REPORT   Patient Name:   Dennis Bates Date of Exam: 01/20/2023 Medical Rec #:  [id number]        Height:       67.0 in Accession #:  [id number]       Weight:       170.4 lb Date of Birth:  1961-06-03        BSA:          1.889 m Patient Age:    62 years         BP:           108/62 mmHg Patient Gender: M                HR:           71 bpm. Exam Location:  Inpatient Procedure: 2D Echo, 3D Echo, Cardiac Doppler and Color Doppler Indications:    Bacteremia  History:        Patient has prior history of Echocardiogram examinations, most                 recent 12/25/2019. CAD and Previous Myocardial Infarction, Aortic                 Valve Disease, Signs/Symptoms:Shortness of Breath, Bacteremia                 and Dyspnea; Risk Factors:Dyslipidemia and Diabetes. Bicuspid                 aortic valve. Aortic stenosis. Bentall procedure.                 Aortic Valve: 23 mm St. Jude mechanical valve is present in the                 aortic position. Procedure Date: 10/09/2015.  Sonographer:    Sheralyn Boatman RDCS Referring Phys: Jonah Blue IMPRESSIONS  1. Right ventricular systolic function is normal. The right ventricular size is normal. There is mildly elevated pulmonary artery systolic pressure.  2. There is a 23 mm St. Jude Careers information officer series valve present in the aortic position.  3. Left ventricular ejection fraction, by estimation, is 55 to 60%. The left ventricle has normal function. The left ventricle has no regional wall motion abnormalities. There is moderate concentric left ventricular hypertrophy. Left ventricular diastolic parameters are consistent with Grade II diastolic dysfunction (pseudonormalization). Elevated left ventricular end-diastolic pressure.  4. The mitral valve is normal in structure. Mild mitral valve regurgitation. No evidence of mitral  stenosis.  5. The inferior vena cava is dilated in size with <50% respiratory variability, suggesting right atrial pressure of 15 mmHg.  6. Aortic root/ascending aorta has been repaired/replaced. FINDINGS  Left Ventricle: Left ventricular ejection fraction, by estimation, is 55 to 60%. The left ventricle has normal function. The left ventricle has no regional wall motion abnormalities. The left ventricular internal cavity size was normal in size. There is  moderate concentric left ventricular hypertrophy. Left ventricular diastolic parameters are consistent with Grade II diastolic dysfunction (pseudonormalization). Elevated left ventricular end-diastolic pressure. Right Ventricle: The right ventricular size is normal. No increase in right ventricular wall thickness. Right ventricular systolic function is normal. There is mildly elevated pulmonary artery systolic pressure. The tricuspid regurgitant velocity is 2.55  m/s, and with an assumed right atrial pressure of 15 mmHg, the estimated right ventricular systolic pressure is 41.0 mmHg. Left Atrium: Left atrial size was normal in size. Right Atrium: Right atrial size was normal in size. Pericardium: There is no evidence of pericardial effusion. Mitral Valve: The mitral valve is normal in structure. Mild mitral valve regurgitation. No evidence of mitral valve stenosis. MV peak gradient, 8.6 mmHg. The mean mitral  valve gradient is 3.0 mmHg. Tricuspid Valve: The tricuspid valve is normal in structure. Tricuspid valve regurgitation is mild . No evidence of tricuspid stenosis. Aortic Valve: Procedure date 10/09/2015/. The aortic valve has been repaired/replaced. Aortic valve regurgitation is not visualized. No aortic stenosis is present. Aortic valve mean gradient measures 14.3 mmHg. Aortic valve peak gradient measures 27.2 mmHg. Aortic valve area, by VTI measures 1.24 cm. There is a 23 mm St. Jude mechanical valve present in the aortic position. Procedure Date:  10/09/2015. Echo findings are consistent with normal structure and function of the aortic valve prosthesis. Pulmonic Valve: The pulmonic valve was normal in structure. Pulmonic valve regurgitation is not visualized. No evidence of pulmonic stenosis. Aorta: The aortic root/ascending aorta has been repaired/replaced. Venous: The inferior vena cava is dilated in size with less than 50% respiratory variability, suggesting right atrial pressure of 15 mmHg. IAS/Shunts: No atrial level shunt detected by color flow Doppler.  LEFT VENTRICLE PLAX 2D LVIDd:         4.45 cm      Diastology LVIDs:         2.80 cm      LV e' medial:    7.72 cm/s LV PW:         1.20 cm      LV E/e' medial:  16.2 LV IVS:        1.50 cm      LV e' lateral:   7.07 cm/s LVOT diam:     1.90 cm      LV E/e' lateral: 17.7 LV SV:         69 LV SV Index:   36 LVOT Area:     2.84 cm                              3D Volume EF: LV Volumes (MOD)            3D EF:        57 % LV vol d, MOD A2C: 104.0 ml LV EDV:       121 ml LV vol d, MOD A4C: 113.0 ml LV ESV:       51 ml LV vol s, MOD A2C: 49.1 ml  LV SV:        69 ml LV vol s, MOD A4C: 43.6 ml LV SV MOD A2C:     54.9 ml LV SV MOD A4C:     113.0 ml LV SV MOD BP:      65.1 ml RIGHT VENTRICLE            IVC RV S prime:     8.27 cm/s  IVC diam: 2.10 cm TAPSE (M-mode): 1.6 cm LEFT ATRIUM             Index        RIGHT ATRIUM           Index LA diam:        3.50 cm 1.85 cm/m   RA Area:     17.00 cm LA Vol (A2C):   55.7 ml 29.48 ml/m  RA Volume:   43.40 ml  22.97 ml/m LA Vol (A4C):   37.4 ml 19.80 ml/m LA Biplane Vol: 50.1 ml 26.52 ml/m  AORTIC VALVE AV Area (Vmax):    1.32 cm AV Area (Vmean):   1.26 cm AV Area (VTI):     1.24 cm AV Vmax:  260.67 cm/s AV Vmean:          174.000 cm/s AV VTI:            0.555 m AV Peak Grad:      27.2 mmHg AV Mean Grad:      14.3 mmHg LVOT Vmax:         121.00 cm/s LVOT Vmean:        77.200 cm/s LVOT VTI:          0.243 m LVOT/AV VTI ratio: 0.44  AORTA Ao Root diam:  3.00 cm Ao Asc diam:  3.20 cm MITRAL VALVE                TRICUSPID VALVE MV Area (PHT): 4.31 cm     TR Peak grad:   26.0 mmHg MV Area VTI:   1.94 cm     TR Vmax:        255.00 cm/s MV Peak grad:  8.6 mmHg MV Mean grad:  3.0 mmHg     SHUNTS MV Vmax:       1.47 m/s     Systemic VTI:  0.24 m MV Vmean:      77.3 cm/s    Systemic Diam: 1.90 cm MV Decel Time: 176 msec MV E velocity: 125.00 cm/s MV A velocity: 50.80 cm/s MV E/A ratio:  2.46 Chilton Si MD Electronically signed by Chilton Si MD Signature Date/Time: 01/20/2023/3:20:55 PM    Final    CT CHEST ABDOMEN PELVIS W CONTRAST  Result Date: 01/19/2023 CLINICAL DATA:  Sepsis.  Abdominal pain for 2 weeks with fever. EXAM: CT CHEST, ABDOMEN, AND PELVIS WITH CONTRAST TECHNIQUE: Multidetector CT imaging of the chest, abdomen and pelvis was performed following the standard protocol during bolus administration of intravenous contrast. RADIATION DOSE REDUCTION: This exam was performed according to the departmental dose-optimization program which includes automated exposure control, adjustment of the mA and/or kV according to patient size and/or use of iterative reconstruction technique. CONTRAST:  85mL OMNIPAQUE IOHEXOL 300 MG/ML  SOLN COMPARISON:  CTA abdomen and pelvis 01/21/2020. Chest CTA 09/24/2015 FINDINGS: CT CHEST FINDINGS Cardiovascular: Heart is nonenlarged. Trace pericardial fluid. Status post median sternotomy with prosthetic aortic valve. Also graft material along the ascending thoracic aorta. Mediastinum/Nodes: Normal caliber thoracic esophagus. Esophagus is mildly patulous. Preserved thyroid gland. No specific abnormal lymph node enlargement identified in the axillary region, hilum or mediastinum. Lungs/Pleura: There is some linear opacity seen along bases likely scar or atelectasis. No consolidation, pneumothorax or effusion. Mild breathing motion. Musculoskeletal: Scattered degenerative changes of the thoracic spine. CT ABDOMEN PELVIS FINDINGS  Hepatobiliary: Stones in the nondilated gallbladder. No enhancing liver mass. Patent portal vein. Pancreas: Unremarkable. No pancreatic ductal dilatation or surrounding inflammatory changes. Spleen: Normal in size without focal abnormality. Adrenals/Urinary Tract: The adrenal glands are preserved. No enhancing renal mass or collecting system dilatation. Nonspecific perinephric stranding. Preserved contours of the urinary bladder but the bladder wall slightly thickened. Please correlate with any symptoms. Stomach/Bowel: On this non oral contrast exam, the large bowel has a normal course and caliber with scattered stool. Normal appendix extends medial to the cecum in the right lower quadrant. Second portion duodenal diverticula. Third portion duodenal diverticula also seen. Small bowel overall is nondilated. Vascular/Lymphatic: Aortic atherosclerosis. No enlarged abdominal or pelvic lymph nodes. Retroaortic left renal vein. Reproductive: Prostate is unremarkable. Other: No free air or free fluid. Musculoskeletal: Scattered degenerative changes of the spine and pelvis. There are some areas of canal stenosis along the lower  lumbar spine with posterior disc bulging and osteophyte formation. Transitional lumbosacral segment. IMPRESSION: No consolidation, pneumothorax or effusion. 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 No bowel obstruction, free air or free fluid. Normal appendix. Scattered stool. Gallstones in the nondilated gallbladder. Fatty liver infiltration. Slight wall thickening of the urinary bladder, nonspecific. Please correlate with any symptoms of a subtle cystitis. Electronically Signed   By: Karen Kays M.D.   On: 01/19/2023 10:18   CT Head Wo Contrast  Result Date: 01/19/2023 CLINICAL DATA:  Mental status change for 2 weeks. EXAM: CT HEAD WITHOUT CONTRAST TECHNIQUE: Contiguous axial images were obtained from the base of the skull through the vertex without  intravenous contrast. RADIATION DOSE REDUCTION: This exam was performed according to the departmental dose-optimization program which includes automated exposure control, adjustment of the mA and/or kV according to patient size and/or use of iterative reconstruction technique. COMPARISON:  Head CT 09/14/2013 FINDINGS: Brain: Small low densities in the bilateral cerebellum, convincing even when accounting for motion artifact. No evidence of supratentorial infarct. No hemorrhage, hydrocephalus, collection. Vascular: No hyperdense vessel or unexpected calcification. Skull: Normal. Negative for fracture or focal lesion. Sinuses/Orbits: No acute finding. 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 Motion degraded head CT especially at the ventral brain. Electronically Signed   By: Tiburcio Pea M.D.   On: 01/19/2023 10:08   US Abdomen Complete  Result Date: 01/19/2023 CLINICAL DATA:  Cholelithiasis, elevated lipase, question obstruction EXAM: ABDOMEN ULTRASOUND COMPLETE COMPARISON:  03/19/2022 FINDINGS: Gallbladder: Multiple small shadowing calculi in gallbladder up to 5 mm diameter. Associated sludge. No gallbladder wall thickening, pericholecystic fluid, or sonographic Murphy sign. Common bile duct: Diameter: 6 mm, normal for age Liver: Heterogeneously increased echogenicity, likely fatty infiltration, though can be seen with cirrhosis and some infiltrative disorders. No mass or nodularity. Portal vein is patent on color Doppler imaging with normal direction of blood flow towards the liver. IVC: Normal appearance Pancreas: Normal appearance.  No mass or ductal dilatation. Spleen: Normal echogenicity. 13.3 cm greatest diameter. No focal abnormalities. Right Kidney: Length: 12.8 cm. Normal cortical thickness and echogenicity. Tiny cyst inferior pole 9 mm diameter, simple features; no follow-up imaging recommended. No additional mass or hydronephrosis. Left Kidney: Length: 12.6 cm. Normal  morphology without mass or hydronephrosis. Abdominal aorta: Normal caliber Other findings: No free fluid IMPRESSION: Cholelithiasis and sludge within gallbladder without evidence of acute cholecystitis or biliary dilatation. Probable fatty infiltration of liver as above. Remainder of exam unremarkable. Electronically Signed   By: Ulyses Southward M.D.   On: 01/19/2023 09:02    EKG: Independently reviewed.  NSR with rate 74; nonspecific ST changes with IVCD   Labs on Admission: I have personally reviewed the available labs and imaging studies at the time of the admission.  Pertinent labs:    Na++ 130 Glucose 134 Albumin 3.0 Lipase 115 HS troponin 9, 12 Normal BNP WBC 9.5 Hgb 11.9 INR 1.8 COVID/flu/RSV negative UA: >1000 glucose, small Hgb, 40 ketones, trace protein, few bacteria BCID + E facecalis Blood cultures x 2 + for GPC   Assessment and Plan: Principal Problem:   Enterococcal bacteremia Active Problems:   Type 2 diabetes mellitus with hyperglycemia (HCC)   Gallstones   Prosthetic valve endocarditis (HCC)   Chronic infection of prosthetic knee (HCC)   Anticoagulated on warfarin   Essential hypertension   Dyslipidemia    Enterococcus faecalis bacteremia -Patient with several weeks of vague symptoms of lethargy, malaise, head/abdominal pain -Some diarrhea -  Mild urinary symptoms, if present -Presented to [facility] yesterday and [facility] + for enterococcus bacteremia which cultures appear to confirm -Will admit to telemetry -Echo today, TEE tomorrow -ID consult -GI pathogen vs. UTI as most likely source despite negative CT -Will treat with antibiotics per ID  H/o AVR -Unfortunately, given his prior AVR he is at increased risk for endocarditis -ID is consulting -Echo today, TEE tomorrow -Coumadin dosing per pharmacy (slightly low INR, may need bridge tomorrow post-TEE)  DM -Recent A1c was 7, good control -hold Glucophage, Jardiance -Cover with moderate-scale SSI    HTN -Continue carvedilol  HLD -Continue rosuvastatin, gemfibrozil  Gallstones -No current evidence of acute cholecystitis or other obvious indication for current cholecystectomy -It is possible that sepsis came from this source but seems less likely -For now, will plan for outpatient surgery f/u  R knee effusion -No erythema but this is another potential source of infection -Ortho consulted, dry tap -As above, this is unlikely to be the source of infection currently    Advance Care Planning:   Code Status: Full Code - Code status was discussed with the patient and wife at the time of admission.  The patient would want to receive full resuscitative measures at this time.   Consults: ID; Cardiology; Orthopedics; nutrition; TOC team; PT/OT  DVT Prophylaxis: Coumadin  Family Communication: Wife was present throughout evaluation  Severity of Illness: The appropriate patient status for this patient is INPATIENT. Inpatient status is judged to be reasonable and necessary in order to provide the required intensity of service to ensure the patient's safety. The patient's presenting symptoms, physical exam findings, and initial radiographic and laboratory data in the context of their chronic comorbidities is felt to place them at high risk for further clinical deterioration. Furthermore, it is not anticipated that the patient will be medically stable for discharge from the hospital within 2 midnights of admission.   * I certify that at the point of admission it is my clinical judgment that the patient will require inpatient hospital care spanning beyond 2 midnights from the point of admission due to high intensity of service, high risk for further deterioration and high frequency of surveillance required.*  Author: Jonah Blue, MD 01/20/2023 5:49 PM  For on call review [email or web address].

## 2023-01-20 NOTE — Consult Note (Signed)
[facility]    Date of Admission:  01/20/2023     Total days of antibiotics 2               Reason for Consult: Enterococcal bacteremia    Referring Provider: [employer]  Primary Care Provider: Kristian Covey, MD   ASSESSMENT:  Dennis Bates is a 62 y/o male presenting with abdominal pain and fevers for a few weeks found to have Enterococcus faecalis bacteremia complicated by presence of mechanical aortic valve and total right knee arthroplasty. Source of infection is likely GI/biliary although no significant findings on imaging. 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 Recheck blood cultures to ensure clearance of bacteremia. Will need TEE to rule out endocarditis which is tentatively planned for tomorrow. Seen by Orthopedics with no fluid able to be aspirated and may need to consider MRI imaging given new onset pain in the setting of bacteremia. Antibiotics changed to ampicillin and ceftriaxone. Plan of care discussed with Dennis Bates and his wife at bedside. Remaining medical and supportive care per Internal Medicine.   PLAN:  Continue current dose of ampicillin and ceftriaxone Repeat blood cultures for clearance of bacteremia TEE to check for prosthetic valve endocarditis.  Consider MRI of the right knee to rule out PJI Remaining medical and supportive care per Internal Medicine.   I have personally spent 32 minutes involved in face-to-face and non-face-to-face activities for this patient on the day of the visit. Professional time spent includes the following activities: Preparing to see the patient (review of tests), Obtaining and/or reviewing separately obtained history (admission/discharge record), Performing a medically appropriate examination and/or evaluation , Ordering medications/tests/procedures,  referring and communicating with other health care professionals, Documenting clinical information in the EMR, Independently interpreting results (not separately reported), Communicating results to the patient/family/caregiver, Counseling and educating the patient/family/caregiver and Care coordination (not separately reported).    Principal Problem:   Enterococcal bacteremia    carvedilol  3.125 mg Oral BID   gemfibrozil  600 mg Oral BID AC   insulin aspart  0-15 Units Subcutaneous TID WC   insulin aspart  0-5 Units Subcutaneous QHS   ondansetron (ZOFRAN) IV  4 mg Intravenous Once   rosuvastatin  20 mg Oral QHS     HPI: Dennis Bates is a 62 y.o. 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   Dennis Bates came to the ED with 2 weeks history of abdominal pain, loss of appetite, night sweats and fevers. Abdominal ultrasound with cholelithiasis and sludge without evidence of acute cholecystitis or biliary dilation. CT abdomen/pelvis with fatty liver infiltration and gall stones in non dilated gallbladder. Initially felt better with fluid resuscitation and was discharged with follow up recommendations for GI. Course complicated by Enterococcus faecalis bacteremia and was called back to the hospital for treatment. Started on piperacillin-tazobactam. Pain in abdomen was similar to previous bilary colic that he had last year.   Dennis Bates was afebrile in the ED with initial leukocytosis of 12.8 now improved to 9.5. Feeling a little bit better. Has had back pain in the past and right knee pain that has recently been worsening with no significant injury or trauma. He did walk  a 5k last month.   Review of Systems: Review of Systems  Constitutional:  Positive for diaphoresis, fever and malaise/fatigue. Negative for  chills and weight loss.  Respiratory:  Negative for cough, shortness of breath and wheezing.   Cardiovascular:  Negative for chest pain and leg swelling.  Gastrointestinal:  Positive for abdominal pain. Negative for constipation, diarrhea, nausea and vomiting.  Musculoskeletal:        Positive for right knee pain.   Skin:  Negative for rash.     Past Medical History:  Diagnosis Date   Aortic stenosis due to bicuspid aortic valve 09/2014   Severe aortic stenosis of bicuspid valve - s/pAVR with Bentall ( 09/2015)   Bell's palsy    LAST EPISODE 09-2015   CAD S/P percutaneous coronary angioplasty 09/20/2014   a. inferolat STEMI ->> LHC-Angio: Proximal/ostial LAD 20%, OM2 99% -->> PCI: 3mm x 16 mm Promus Premier DES to the OM2.(~3.5 mm)   Diabetes mellitus type 2, controlled, with complications (HCC)    History of mechanical aortic valve replacement 10/09/2015   after 1 yr DAPT for Inferolateral STEMI. (Dr. Laneta Simmers)   Hyperlipidemia    Hypertension    Hypertriglyceridemia    Shoulder arthritis 07/11/2020   Rt Severe GH DJD xray Sep 2021   ST elevation myocardial infarction (STEMI) of inferior wall (HCC) 09/20/2014   99 % occluded Cx-OM2 Promus DES 3.0 mm x 16 mm (3.5 mm)    Social History   Tobacco Use   Smoking status: Never   Smokeless tobacco: Former    Types: Chew    Quit date: 09/01/1985  Vaping Use   Vaping Use: Never used  Substance Use Topics   Alcohol use: Yes    Alcohol/week: 0.0 standard drinks of alcohol    Comment: socially   Drug use: No    Family History  Problem Relation Age of Onset   Heart disease Mother    CAD Neg Hx        per wife    No Known Allergies  OBJECTIVE: Blood pressure 108/62, pulse 78, temperature 99 F (37.2 C), temperature source Oral, resp. rate 18, height 5\' 7"  (1.702 m), weight 77.3 kg, SpO2 97 %.  Physical Exam Constitutional:      General: He is not in acute distress.    Appearance: He is well-developed. He is ill-appearing.   Cardiovascular:     Rate and Rhythm: Normal rate and regular rhythm.     Heart sounds: Normal heart sounds.  Pulmonary:     Effort: Pulmonary effort is normal.     Breath sounds: Normal breath sounds.  Musculoskeletal:     Comments: Right knee with moderate tenderness and no obvious edema, deformity or discoloration.   Skin:    General: Skin is warm and dry.  Neurological:     Mental Status: He is alert and oriented to person, place, and time.  Psychiatric:        Mood and Affect: Mood normal.     Lab Results Lab Results  Component Value Date   WBC 9.5 01/20/2023   HGB 11.9 (L) 01/20/2023   HCT 34.3 (L) 01/20/2023   MCV 84.1 01/20/2023   PLT 234 01/20/2023    Lab Results  Component Value Date   CREATININE 0.87 01/20/2023   BUN 15 01/20/2023   NA 130 (L) 01/20/2023   K 3.7 01/20/2023   CL 96 (L) 01/20/2023   CO2 21 (L) 01/20/2023    Lab Results  Component  Value Date   ALT 26 01/20/2023   AST 28 01/20/2023   ALKPHOS 41 01/20/2023   BILITOT 1.1 01/20/2023     Microbiology: Recent Results (from the past 240 hour(s))  Culture, blood (routine x 2)     Status: Abnormal (Preliminary result)   Collection Time: 01/19/23  9:34 AM   Specimen: BLOOD  Result Value Ref Range Status   Specimen Description   Final    BLOOD LEFT ARM Performed at [facility], [address] [place], [place] [address]    Special Requests   Final    BOTTLES DRAWN AEROBIC AND ANAEROBIC Blood Culture adequate volume Performed at [facility], [address] [place], [place] [address]    Culture  Setup Time   Final    GRAM POSITIVE COCCI IN BOTH AEROBIC AND ANAEROBIC BOTTLES CRITICAL RESULT CALLED TO, READ BACK BY AND VERIFIED WITH: KELLY GIBSON,RN@0039  01/20/23 MK    Culture (A)  Final    ENTEROCOCCUS FAECALIS CULTURE REINCUBATED FOR BETTER GROWTH Performed at [facility], [address], [place], [place] [address]    Report Status  PENDING  Incomplete  Blood Culture ID Panel (Reflexed)     Status: Abnormal   Collection Time: 01/19/23  9:34 AM  Result Value Ref Range Status   Enterococcus faecalis DETECTED (A) NOT DETECTED Final    Comment: CRITICAL RESULT CALLED TO, READ BACK BY AND VERIFIED WITH: KELLY GIBSON,RN@0040  01/20/23 MK    Enterococcus Faecium NOT DETECTED NOT DETECTED Final   Listeria monocytogenes NOT DETECTED NOT DETECTED Final   Staphylococcus species NOT DETECTED NOT DETECTED Final   Staphylococcus aureus (BCID) NOT DETECTED NOT DETECTED Final   Staphylococcus epidermidis NOT DETECTED NOT DETECTED Final   Staphylococcus lugdunensis NOT DETECTED NOT DETECTED Final   Streptococcus species NOT DETECTED NOT DETECTED Final   Streptococcus agalactiae NOT DETECTED NOT DETECTED Final   Streptococcus pneumoniae NOT DETECTED NOT DETECTED Final   Streptococcus pyogenes NOT DETECTED NOT DETECTED Final   A.calcoaceticus-baumannii NOT DETECTED NOT DETECTED Final   Bacteroides fragilis NOT DETECTED NOT DETECTED Final   Enterobacterales NOT DETECTED NOT DETECTED Final   Enterobacter cloacae complex NOT DETECTED NOT DETECTED Final   Escherichia coli NOT DETECTED NOT DETECTED Final   Klebsiella aerogenes NOT DETECTED NOT DETECTED Final   Klebsiella oxytoca NOT DETECTED NOT DETECTED Final   Klebsiella pneumoniae NOT DETECTED NOT DETECTED Final   Proteus species NOT DETECTED NOT DETECTED Final   Salmonella species NOT DETECTED NOT DETECTED Final   Serratia marcescens NOT DETECTED NOT DETECTED Final   Haemophilus influenzae NOT DETECTED NOT DETECTED Final   Neisseria meningitidis NOT DETECTED NOT DETECTED Final   Pseudomonas aeruginosa NOT DETECTED NOT DETECTED Final   Stenotrophomonas maltophilia NOT DETECTED NOT DETECTED Final   Candida albicans NOT DETECTED NOT DETECTED Final   Candida auris NOT DETECTED NOT DETECTED Final   Candida glabrata NOT DETECTED NOT DETECTED Final   Candida krusei NOT DETECTED NOT  DETECTED Final   Candida parapsilosis NOT DETECTED NOT DETECTED Final   Candida tropicalis NOT DETECTED NOT DETECTED Final   Cryptococcus neoformans/gattii NOT DETECTED NOT DETECTED Final   Vancomycin resistance NOT DETECTED NOT DETECTED Final    Comment: Performed at [facility], [address] [place], [place] [address]  Culture, blood (routine x 2)     Status: None (Preliminary result)   Collection Time: 01/19/23 10:15 AM   Specimen: BLOOD  Result Value Ref Range Status  Specimen Description   Final    BLOOD BLOOD RIGHT FOREARM Performed at [facility], [address] [place], [place] [address]    Special Requests   Final    BOTTLES DRAWN AEROBIC AND ANAEROBIC Blood Culture adequate volume Performed at [facility], [address], [place], [place] [address]    Culture  Setup Time   Final    GRAM POSITIVE COCCI IN CHAINS IN BOTH AEROBIC AND ANAEROBIC BOTTLES Performed at [facility], [address] [place], [place] [address]    Culture GRAM POSITIVE COCCI  Final   Report Status PENDING  Incomplete  Resp panel by RT-PCR (RSV, Flu A&B, Covid) Anterior Nasal Swab     Status: None   Collection Time: 01/19/23 12:08 PM   Specimen: Anterior Nasal Swab  Result Value Ref Range Status   SARS Coronavirus 2 by RT PCR NEGATIVE NEGATIVE Final    Comment: (NOTE) SARS-CoV-2 target nucleic acids are NOT DETECTED.  The SARS-CoV-2 RNA is generally detectable in upper respiratory specimens during the acute phase of infection. The lowest concentration of SARS-CoV-2 viral copies this assay can detect is 138 copies/mL. A negative result does not preclude SARS-Cov-2 infection and should not be used as the sole basis for treatment or other patient management decisions. A negative result may occur with  improper specimen collection/handling, submission of specimen other than nasopharyngeal swab, presence of viral mutation(s) within  the areas targeted by this assay, and inadequate number of viral copies(<138 copies/mL). A negative result must be combined with clinical observations, patient history, and epidemiological information. The expected result is Negative.  Fact Sheet for Patients:  BloggerCourse.com  Fact Sheet for Healthcare Providers:  SeriousBroker.it  This test is no t yet approved or cleared by the [place] FDA and  has been authorized for detection and/or diagnosis of SARS-CoV-2 by FDA under an Emergency Use Authorization (EUA). This EUA will remain  in effect (meaning this test can be used) for the duration of the COVID-19 declaration under Section 564(b)(1) of the Act, 21 U.S.C.section 360bbb-3(b)(1), unless the authorization is terminated  or revoked sooner.       Influenza A by PCR NEGATIVE NEGATIVE Final   Influenza B by PCR NEGATIVE NEGATIVE Final    Comment: (NOTE) The Xpert Xpress SARS-CoV-2/FLU/RSV plus assay is intended as an aid in the diagnosis of influenza from Nasopharyngeal swab specimens and should not be used as a sole basis for treatment. Nasal washings and aspirates are unacceptable for Xpert Xpress SARS-CoV-2/FLU/RSV testing.  Fact Sheet for Patients: BloggerCourse.com  Fact Sheet for Healthcare Providers: SeriousBroker.it  This test is not yet approved or cleared by the [place] FDA and has been authorized for detection and/or diagnosis of SARS-CoV-2 by FDA under an Emergency Use Authorization (EUA). This EUA will remain in effect (meaning this test can be used) for the duration of the COVID-19 declaration under Section 564(b)(1) of the Act, 21 U.S.C. section 360bbb-3(b)(1), unless the authorization is terminated or revoked.     Resp Syncytial Virus by PCR NEGATIVE NEGATIVE Final    Comment: (NOTE) Fact Sheet for  Patients: BloggerCourse.com  Fact Sheet for Healthcare Providers: SeriousBroker.it  This test is not yet approved or cleared by the [place] FDA and has been authorized for detection and/or diagnosis of SARS-CoV-2 by FDA under an Emergency Use Authorization (EUA). This EUA will remain in effect (meaning this test can be used) for the duration of the COVID-19 declaration under Section 564(b)(1)  of the Act, 21 U.S.C. section 360bbb-3(b)(1), unless the authorization is terminated or revoked.  Performed at [facility], [address], [place], [place] [address]      Marcos Eke, NP [facility]  01/20/2023  12:33 PM

## 2023-01-20 NOTE — Progress Notes (Signed)
  Echocardiogram 2D Echocardiogram has been performed.  Dennis Bates 01/20/2023, 3:14 PM

## 2023-01-20 NOTE — Progress Notes (Signed)
    [facility] has been requested to perform a transesophageal echocardiogram on KAIUS SALBER for evaluation of possible endocarditis with enterococcus faecalis bacteremia in patient with hx St. Jude mechanical AVR.  After careful review of history and examination, the risks and benefits of transesophageal echocardiogram have been explained including risks of esophageal damage, perforation (1:10,000 risk), bleeding, pharyngeal hematoma as well as other potential complications associated with conscious sedation including aspiration, arrhythmia, respiratory failure and death. Alternatives to treatment were discussed, questions were answered. Patient is willing to proceed.   Perlie Gold PA-C 01/20/2023 3:38 PM

## 2023-01-20 NOTE — TOC Initial Note (Signed)
Transition of Care [facility]) - Initial/Assessment Note    Patient Details  Name: Dennis Bates MRN: [id number] Date of Birth: 26-Feb-1961  [facility] CM/SW Contact:    Gordy Clement, RN Phone Number: 01/20/2023, 2:10 PM  Clinical Narrative:      CM acknowledges Consult to [facility] for HH/DME needs  Patient here from home and lives with his Wife. Patient presents with  enterococcus faecalis bacteremia complicated by presence of mechanical aortic valve and total right knee arthroplasty . TOC will continue to follow patient for any additional discharge needs                Expected Discharge Plan: Home w Home Health Services (possible [facility]) Barriers to Discharge: Continued Medical Work up   Patient Goals and CMS Choice            Expected Discharge Plan and Services                                              Prior Living Arrangements/Services   Lives with:: Spouse                   Activities of Daily Living Home Assistive Devices/Equipment: None ADL Screening (condition at time of admission) Patient's cognitive ability adequate to safely complete daily activities?: Yes Is the patient deaf or have difficulty hearing?: No Does the patient have difficulty seeing, even when wearing glasses/contacts?: No Does the patient have difficulty concentrating, remembering, or making decisions?: No Patient able to express need for assistance with ADLs?: Yes Does the patient have difficulty dressing or bathing?: No Independently performs ADLs?: Yes (appropriate for developmental age) Does the patient have difficulty walking or climbing stairs?: No Weakness of Legs: Right Weakness of Arms/Hands: None  Permission Sought/Granted                  Emotional Assessment              Admission diagnosis:  Bacteremia [R78.81] Hyponatremia [E87.1] Enterococcus faecalis infection [A49.8] Anticoagulated on warfarin [Z79.01] Elevated lipase  [R74.8] Bacteremia due to Enterococcus [R78.81, B95.2] Patient Active Problem List   Diagnosis Date Noted   Enterococcal bacteremia 01/20/2023   Enterococcus faecalis infection 01/20/2023   Gallstones 01/19/2023   RUQ pain 01/19/2023   Shoulder arthritis 07/11/2020   Chronic anticoagulation    Symptomatic anemia    Melena    Iron deficiency anemia    Gastrointestinal hemorrhage 01/18/2020   DOE (dyspnea on exertion) 12/17/2019   Primary osteoarthritis of right knee 12/09/2017   Retained orthopedic hardware 12/09/2017   Primary osteoarthritis of left knee 12/09/2017   Preoperative clearance 09/02/2017   Encounter for medication adjustment 07/14/2016   Long term (current) use of anticoagulants [Z79.01] 10/27/2015   Hx of prosthetic aortic valve replacement -mechanical; along with aortic root ([place]) 10/27/2015   Type 2 diabetes mellitus with hyperglycemia (HCC) 10/21/2015   Aortic root dilatation (HCC)    Hypertriglyceridemia    CAD S/P p DES PCI to the Circumflex-OM 2    Diabetes mellitus type II, non insulin dependent (HCC) 09/21/2014   Aortic stenosis due to bicuspid aortic valve 09/21/2014   Hyperlipidemia associated with type 2 diabetes mellitus (HCC) 09/21/2014   ST elevation myocardial infarction (STEMI) of inferolateral wall (HCC) 09/20/2014   PCP:  Kristian Covey, MD Pharmacy:   [id number] [address], [place] - [address] AT [facility] [address] [place] [address] Phone: [phone] Fax: [phone]  [facility] [address] [place], [place] - [address] [id number] [place] [address] Phone: [phone] Fax: [phone]     Social Determinants of Health (SDOH) Social History: SDOH Screenings   Food Insecurity: No Food Insecurity (01/20/2023)  Housing: Low Risk  (01/20/2023)  Transportation Needs: No Transportation Needs (01/20/2023)  Utilities: Not At Risk (01/20/2023)  Depression (PHQ2-9): Low Risk   (01/17/2023)  Tobacco Use: Medium Risk (01/20/2023)   SDOH Interventions:     Readmission Risk Interventions     No data to display

## 2023-01-20 NOTE — Progress Notes (Signed)
ANTICOAGULATION CONSULT NOTE - Initial Consult  Pharmacy Consult for warfarin Indication: Mechanical aortic valve  No Known Allergies  Patient Measurements: Height: 5\' 7"  (170.2 cm) Weight: 77.3 kg (170 lb 6.7 oz) IBW/kg (Calculated) : 66.1 Heparin Dosing Weight: 77.3  Vital Signs: Temp: 99 F (37.2 C) (05/09 1042) Temp Source: Oral (05/09 1042) BP: 108/62 (05/09 1100) Pulse Rate: 78 (05/09 1100)  Labs: Recent Labs    01/17/23 1439 01/19/23 0846 01/19/23 0935 01/19/23 1208 01/20/23 0418  HGB 14.1 13.3  --   --  11.9*  HCT 41.3 38.9*  --   --  34.3*  PLT 240.0 211  --   --  234  LABPROT 39.4*  --  22.0*  --  21.0*  INR 4.0*  --  1.9*  --  1.8*  CREATININE 1.22 0.90  --   --  0.87  CKTOTAL  --   --  104  --   --   TROPONINIHS  --   --  9 12  --     Estimated Creatinine Clearance: 83.4 mL/min (by C-G formula based on SCr of 0.87 mg/dL).   Medical History: Past Medical History:  Diagnosis Date   Aortic stenosis due to bicuspid aortic valve 09/2014   Severe aortic stenosis of bicuspid valve - s/pAVR with [facility] ( 09/2015)   Bell's palsy    LAST EPISODE 09-2015   CAD S/P percutaneous coronary angioplasty 09/20/2014   a. inferolat STEMI ->> LHC-Angio: Proximal/ostial LAD 20%, OM2 99% -->> PCI: 3mm x 16 mm Promus Premier DES to the OM2.(~3.5 mm)   Diabetes mellitus type 2, controlled, with complications (HCC)    History of mechanical aortic valve replacement 10/09/2015   after 1 yr DAPT for Inferolateral STEMI. (Dr. Laneta Simmers)   Hyperlipidemia    Hypertension    Hypertriglyceridemia    Shoulder arthritis 07/11/2020   Rt Severe GH DJD xray Sep 2021   ST elevation myocardial infarction (STEMI) of inferior wall (HCC) 09/20/2014   99 % occluded Cx-OM2 Promus DES 3.0 mm x 16 mm (3.5 mm)    Medications:  Medications Prior to Admission  Medication Sig Dispense Refill Last Dose   acetaminophen (TYLENOL) 325 MG tablet Take 2 tablets (650 mg total) by mouth every 6 (six)  hours as needed for mild pain.   Past Week   carvedilol (COREG) 3.125 MG tablet TAKE 1 TABLET TWICE A DAY 60 tablet 1 01/19/2023 at 1800   gemfibrozil (LOPID) 600 MG tablet TAKE 1 TABLET IN THE MORNING AND AT BEDTIME 60 tablet 1 01/19/2023 at pm   JARDIANCE 25 MG TABS tablet TAKE 1 TABLET DAILY BEFORE BREAKFAST 30 tablet 1 01/19/2023 at pm   metFORMIN (GLUCOPHAGE) 1000 MG tablet TAKE 1 TABLET TWICE A DAY WITH MEALS 60 tablet 1 01/19/2023 at pm   rosuvastatin (CRESTOR) 20 MG tablet TAKE 1 TABLET DAILY AT BEDTIME (Patient taking differently: Take 20 mg by mouth at bedtime.) 30 tablet 1 01/18/2023 at pm   tadalafil (CIALIS) 20 MG tablet Take 1 tablet every other day as needed for erectile dysfunction 6 tablet 11 UNKNOWN   warfarin (COUMADIN) 10 MG tablet TAKE 1 TABLET DAILY EXCEPT TAKE 1 1/2 TABLETS ON MONDAYS AND THURSDAYS, OR TAKE AS DIRECTED BY ANTICOAGULATION CLINIC (Patient taking differently: Take 10-15 mg by mouth See admin instructions. TAKE 1 TABLET DAILY EXCEPT TAKE 1 1/2 TABLETS ON MONDAYS AND THURSDAYS, OR TAKE AS DIRECTED BY ANTICOAGULATION CLINIC) 90 tablet 0 01/16/2023 at 0600   ondansetron (ZOFRAN-ODT)  4 MG disintegrating tablet Take 1 tablet (4 mg total) by mouth every 4 (four) hours as needed for nausea or vomiting. 20 tablet 0    traMADol (ULTRAM) 50 MG tablet Take 1 to 2 tablets every 6 hours as needed for pain. 20 tablet 0     Assessment: 62 yo male with h/o of mechanical aortic valve on warfarin PTA. Admitted with enterococcus bacteremia. Patient on warfarin 10mg  daily except 15mg  on Mon and Thursdays per HML. Patient's INR was 4 on 5/6 (last dose of warfarin stated on 5/5). INR today on admit is 1.8 (goal 2.5-3.5). Patient to receive TEE tomorrow, so MD hesitant to start bridge therapy. Would start bridge therapy after TEE until INR >2.5.  Goal of Therapy:  INR 2.5-3.5 Monitor platelets by anticoagulation protocol: Yes   Plan:  Warfarin 15mg  Po x 1 today Would consider lovenox therapy to  bridge when MD feels appropriate Daily INR  Ellenore Roscoe A. Jeanella Craze, PharmD, BCPS, FNKF Clinical Pharmacist [place] Please utilize Amion for appropriate phone number to reach the unit pharmacist [facility])  01/20/2023,1:10 PM

## 2023-01-20 NOTE — Consult Note (Signed)
Reason for Consult:r knee pain Referring Physician: Hospitalists  Dennis Bates is an 62 y.o. male.  HPI: The patient presented to the emergency room this morning with some swelling in the right knee.  He was sent to the hospital because blood cultures came back with bacteremia.  He was seen by the orthopedic PA who attempted aspiration but was unsuccessful.  He is admitted to the hospital and I came to see him just for evaluation.  Patient states that he is really struggling to move the leg and is having a little bit more pain that he been having.  He has been running some fevers at home but he had not been measuring it.  Said the knee has been working great up until just the last couple of weeks when it has been bothering him quite a lot.    Past Medical History:  Diagnosis Date   Aortic stenosis due to bicuspid aortic valve 09/2014   Severe aortic stenosis of bicuspid valve - s/pAVR with Bentall ( 09/2015)   Bell's palsy    LAST EPISODE 09-2015   CAD S/P percutaneous coronary angioplasty 09/20/2014   a. inferolat STEMI ->> LHC-Angio: Proximal/ostial LAD 20%, OM2 99% -->> PCI: 3mm x 16 mm Promus Premier DES to the OM2.(~3.5 mm)   Diabetes mellitus type 2, controlled, with complications (HCC)    History of mechanical aortic valve replacement 10/09/2015   after 1 yr DAPT for Inferolateral STEMI. (Dr. Laneta Simmers)   Hyperlipidemia    Hypertension    Hypertriglyceridemia    Shoulder arthritis 07/11/2020   Rt Severe GH DJD xray Sep 2021   ST elevation myocardial infarction (STEMI) of inferior wall (HCC) 09/20/2014   99 % occluded Cx-OM2 Promus DES 3.0 mm x 16 mm (3.5 mm)    Past Surgical History:  Procedure Laterality Date   BENTALL PROCEDURE N/A 10/09/2015   Procedure: BENTALL PROCEDURE (using a St Jude mechanical valve, size 23);  Surgeon: Alleen Borne, MD;  Location: [facility] OR;  Service: Open Heart Surgery;  Laterality: N/A;  CIRC ARRESTNEEDS RIGHT RADIAL A-LINE   COLONOSCOPY WITH PROPOFOL N/A  01/21/2020   Procedure: COLONOSCOPY WITH PROPOFOL;  Surgeon: Lynann Bologna, MD;  Location: [facility] ENDOSCOPY;  Service: Endoscopy;  Laterality: N/A;   ENTEROSCOPY N/A 01/24/2020   Procedure: ENTEROSCOPY;  Surgeon: Sherrilyn Rist, MD;  Location: [facility] ENDOSCOPY;  Service: Gastroenterology;  Laterality: N/A;   ESOPHAGOGASTRODUODENOSCOPY (EGD) WITH PROPOFOL N/A 01/21/2020   Procedure: ESOPHAGOGASTRODUODENOSCOPY (EGD) WITH PROPOFOL;  Surgeon: Lynann Bologna, MD;  Location: [facility] ENDOSCOPY;  Service: Endoscopy;  Laterality: N/A;   GIVENS CAPSULE STUDY N/A 01/22/2020   Procedure: GIVENS CAPSULE STUDY;  Surgeon: Sherrilyn Rist, MD;  Location: [facility] ENDOSCOPY;  Service: Gastroenterology;  Laterality: N/A;   KNEE ARTHROSCOPY W/ ACL RECONSTRUCTION Right    90's; "2 ATHROSCOPIC , 2 REBUILDS"   LEFT HEART CATHETERIZATION WITH CORONARY ANGIOGRAM N/A 09/20/2014   Procedure: LEFT HEART CATHETERIZATION WITH CORONARY ANGIOGRAM;  Surgeon: Marykay Lex, MD;  Location: [facility] CATH LAB;  Proximal/ostial LAD 20%, OM2 99% -->> aspiration thrombectomy and DES PCI:    PERCUTANEOUS CORONARY STENT INTERVENTION (PCI-S)  09/20/2014   Aspiration thrombectomy and DES PCI- OM 2(m-dCx): Promus Premier DES 3.0 mm x 16 mm (3.5 mm)   RIGHT/LEFT HEART CATH AND CORONARY ANGIOGRAPHY  09/2015   Dr. Nicki Guadalajara: Widely patent OM 2 stent.  Mild disease in RCA and LAD.Relatively normal right heart cath pressures.  Normal cardiac output.   TEE WITHOUT  CARDIOVERSION N/A 10/09/2015   Procedure: TRANSESOPHAGEAL ECHOCARDIOGRAM (TEE);  Surgeon: Alleen Borne, MD;  Location: [facility] OR;  Service: Open Heart Surgery;  Laterality: N/A;   TOTAL KNEE ARTHROPLASTY Right 12/09/2017   Procedure: RIGHT TOTAL KNEE ARTHROPLASTY REMOVAL OF HARDWARE RIGHT KNEE;  Surgeon: Jodi Geralds, MD;  Location: [facility] ORS;  Service: Orthopedics;  Laterality: Right;   TRANSTHORACIC ECHOCARDIOGRAM  02/2016   Post AVR: mechanical: AVR without obstuction. LVEF improved to 65-70%.    TRANSTHORACIC ECHOCARDIOGRAM  09/2014, 10/2014   Probable bicuspid aortic valve: a. mod-sev by 2D ECHO 09/2014. AVA 0.9cm^2; b) 10/2014 Echo:. Severe AS Mn-Pk Gradient 41-71 mmHg, AVA ~0.79 cm2, EF 60-65%   TRANSTHORACIC ECHOCARDIOGRAM  12/2019    Normal WM. Gr 2 DD. Mild LA dilation. 23 mm St. Jude Mechanical AoV well seated.  Trivial AI.     Family History  Problem Relation Age of Onset   Heart disease Mother    CAD Neg Hx        per wife    Social History:  reports that he has never smoked. He quit smokeless tobacco use about 37 years ago.  His smokeless tobacco use included chew. He reports current alcohol use. He reports that he does not use drugs.  Allergies: No Known Allergies  Medications: I have reviewed the patient's current medications.  Results for orders placed or performed during the hospital encounter of 01/20/23 (from the past 48 hour(s))  Lipase, blood     Status: Abnormal   Collection Time: 01/20/23  4:18 AM  Result Value Ref Range   Lipase 115 (H) 11 - 51 U/L    Comment: Performed at [facility], [address], [place], [place] [address]  Comprehensive metabolic panel     Status: Abnormal   Collection Time: 01/20/23  4:18 AM  Result Value Ref Range   Sodium 130 (L) 135 - 145 mmol/L   Potassium 3.7 3.5 - 5.1 mmol/L   Chloride 96 (L) 98 - 111 mmol/L   CO2 21 (L) 22 - 32 mmol/L   Glucose, Bld 134 (H) 70 - 99 mg/dL    Comment: Glucose reference range applies only to samples taken after fasting for at least 8 hours.   BUN 15 8 - 23 mg/dL   Creatinine, Ser 1.91 0.61 - 1.24 mg/dL   Calcium 9.1 8.9 - 47.8 mg/dL   Total Protein 6.2 (L) 6.5 - 8.1 g/dL   Albumin 3.0 (L) 3.5 - 5.0 g/dL   AST 28 15 - 41 U/L   ALT 26 0 - 44 U/L   Alkaline Phosphatase 41 38 - 126 U/L   Total Bilirubin 1.1 0.3 - 1.2 mg/dL   GFR, Estimated >29 >56 mL/min    Comment: (NOTE) Calculated using the CKD-EPI Creatinine Equation (2021)    Anion gap 13 5 - 15    Comment: Performed at  [facility], [address] [place], [place] [address]  CBC with Differential     Status: Abnormal   Collection Time: 01/20/23  4:18 AM  Result Value Ref Range   WBC 9.5 4.0 - 10.5 K/uL   RBC 4.08 (L) 4.22 - 5.81 MIL/uL   Hemoglobin 11.9 (L) 13.0 - 17.0 g/dL   HCT 65.7 (L) 84.6 - 96.2 %   MCV 84.1 80.0 - 100.0 fL   MCH 29.2 26.0 - 34.0 pg   MCHC 34.7 30.0 - 36.0 g/dL   RDW 95.2 84.1 - 32.4 %   Platelets  234 150 - 400 K/uL   nRBC 0.0 0.0 - 0.2 %   Neutrophils Relative % 79 %   Neutro Abs 7.5 1.7 - 7.7 K/uL   Lymphocytes Relative 8 %   Lymphs Abs 0.8 0.7 - 4.0 K/uL   Monocytes Relative 12 %   Monocytes Absolute 1.1 (H) 0.1 - 1.0 K/uL   Eosinophils Relative 0 %   Eosinophils Absolute 0.0 0.0 - 0.5 K/uL   Basophils Relative 1 %   Basophils Absolute 0.1 0.0 - 0.1 K/uL   Immature Granulocytes 0 %   Abs Immature Granulocytes 0.03 0.00 - 0.07 K/uL    Comment: Performed at [facility], [address] [place], [place] [address]  Protime-INR     Status: Abnormal   Collection Time: 01/20/23  4:18 AM  Result Value Ref Range   Prothrombin Time 21.0 (H) 11.4 - 15.2 seconds   INR 1.8 (H) 0.8 - 1.2    Comment: (NOTE) INR goal varies based on device and disease states. Performed at [facility], [address], [place], [place] [address]   Glucose, capillary     Status: Abnormal   Collection Time: 01/20/23 12:32 PM  Result Value Ref Range   Glucose-Capillary 143 (H) 70 - 99 mg/dL    Comment: Glucose reference range applies only to samples taken after fasting for at least 8 hours.  Glucose, capillary     Status: Abnormal   Collection Time: 01/20/23  5:00 PM  Result Value Ref Range   Glucose-Capillary 114 (H) 70 - 99 mg/dL    Comment: Glucose reference range applies only to samples taken after fasting for at least 8 hours.    ECHOCARDIOGRAM COMPLETE  Result Date: 01/20/2023    ECHOCARDIOGRAM REPORT   Patient Name:   Dennis Bates Date of Exam: 01/20/2023 Medical  Rec #:  [id number]        Height:       67.0 in Accession #:    [id number]       Weight:       170.4 lb Date of Birth:  1961/07/04        BSA:          1.889 m Patient Age:    62 years         BP:           108/62 mmHg Patient Gender: M                HR:           71 bpm. Exam Location:  Inpatient Procedure: 2D Echo, 3D Echo, Cardiac Doppler and Color Doppler Indications:    Bacteremia  History:        Patient has prior history of Echocardiogram examinations, most                 recent 12/25/2019. CAD and Previous Myocardial Infarction, Aortic                 Valve Disease, Signs/Symptoms:Shortness of Breath, Bacteremia                 and Dyspnea; Risk Factors:Dyslipidemia and Diabetes. Bicuspid                 aortic valve. Aortic stenosis. Bentall procedure.                 Aortic Valve: 23 mm St. Jude mechanical valve is present in the  aortic position. Procedure Date: 10/09/2015.  Sonographer:    Sheralyn Boatman RDCS Referring Phys: Jonah Blue IMPRESSIONS  1. Right ventricular systolic function is normal. The right ventricular size is normal. There is mildly elevated pulmonary artery systolic pressure.  2. There is a 23 mm St. Jude Careers information officer series valve present in the aortic position.  3. Left ventricular ejection fraction, by estimation, is 55 to 60%. The left ventricle has normal function. The left ventricle has no regional wall motion abnormalities. There is moderate concentric left ventricular hypertrophy. Left ventricular diastolic parameters are consistent with Grade II diastolic dysfunction (pseudonormalization). Elevated left ventricular end-diastolic pressure.  4. The mitral valve is normal in structure. Mild mitral valve regurgitation. No evidence of mitral stenosis.  5. The inferior vena cava is dilated in size with <50% respiratory variability, suggesting right atrial pressure of 15 mmHg.  6. Aortic root/ascending aorta has been repaired/replaced. FINDINGS  Left Ventricle: Left  ventricular ejection fraction, by estimation, is 55 to 60%. The left ventricle has normal function. The left ventricle has no regional wall motion abnormalities. The left ventricular internal cavity size was normal in size. There is  moderate concentric left ventricular hypertrophy. Left ventricular diastolic parameters are consistent with Grade II diastolic dysfunction (pseudonormalization). Elevated left ventricular end-diastolic pressure. Right Ventricle: The right ventricular size is normal. No increase in right ventricular wall thickness. Right ventricular systolic function is normal. There is mildly elevated pulmonary artery systolic pressure. The tricuspid regurgitant velocity is 2.55  m/s, and with an assumed right atrial pressure of 15 mmHg, the estimated right ventricular systolic pressure is 41.0 mmHg. Left Atrium: Left atrial size was normal in size. Right Atrium: Right atrial size was normal in size. Pericardium: There is no evidence of pericardial effusion. Mitral Valve: The mitral valve is normal in structure. Mild mitral valve regurgitation. No evidence of mitral valve stenosis. MV peak gradient, 8.6 mmHg. The mean mitral valve gradient is 3.0 mmHg. Tricuspid Valve: The tricuspid valve is normal in structure. Tricuspid valve regurgitation is mild . No evidence of tricuspid stenosis. Aortic Valve: Procedure date 10/09/2015/. The aortic valve has been repaired/replaced. Aortic valve regurgitation is not visualized. No aortic stenosis is present. Aortic valve mean gradient measures 14.3 mmHg. Aortic valve peak gradient measures 27.2 mmHg. Aortic valve area, by VTI measures 1.24 cm. There is a 23 mm St. Jude mechanical valve present in the aortic position. Procedure Date: 10/09/2015. Echo findings are consistent with normal structure and function of the aortic valve prosthesis. Pulmonic Valve: The pulmonic valve was normal in structure. Pulmonic valve regurgitation is not visualized. No evidence of  pulmonic stenosis. Aorta: The aortic root/ascending aorta has been repaired/replaced. Venous: The inferior vena cava is dilated in size with less than 50% respiratory variability, suggesting right atrial pressure of 15 mmHg. IAS/Shunts: No atrial level shunt detected by color flow Doppler.  LEFT VENTRICLE PLAX 2D LVIDd:         4.45 cm      Diastology LVIDs:         2.80 cm      LV e' medial:    7.72 cm/s LV PW:         1.20 cm      LV E/e' medial:  16.2 LV IVS:        1.50 cm      LV e' lateral:   7.07 cm/s LVOT diam:     1.90 cm      LV E/e' lateral: 17.7 LV  SV:         69 LV SV Index:   36 LVOT Area:     2.84 cm                              3D Volume EF: LV Volumes (MOD)            3D EF:        57 % LV vol d, MOD A2C: 104.0 ml LV EDV:       121 ml LV vol d, MOD A4C: 113.0 ml LV ESV:       51 ml LV vol s, MOD A2C: 49.1 ml  LV SV:        69 ml LV vol s, MOD A4C: 43.6 ml LV SV MOD A2C:     54.9 ml LV SV MOD A4C:     113.0 ml LV SV MOD BP:      65.1 ml RIGHT VENTRICLE            IVC RV S prime:     8.27 cm/s  IVC diam: 2.10 cm TAPSE (M-mode): 1.6 cm LEFT ATRIUM             Index        RIGHT ATRIUM           Index LA diam:        3.50 cm 1.85 cm/m   RA Area:     17.00 cm LA Vol (A2C):   55.7 ml 29.48 ml/m  RA Volume:   43.40 ml  22.97 ml/m LA Vol (A4C):   37.4 ml 19.80 ml/m LA Biplane Vol: 50.1 ml 26.52 ml/m  AORTIC VALVE AV Area (Vmax):    1.32 cm AV Area (Vmean):   1.26 cm AV Area (VTI):     1.24 cm AV Vmax:           260.67 cm/s AV Vmean:          174.000 cm/s AV VTI:            0.555 m AV Peak Grad:      27.2 mmHg AV Mean Grad:      14.3 mmHg LVOT Vmax:         121.00 cm/s LVOT Vmean:        77.200 cm/s LVOT VTI:          0.243 m LVOT/AV VTI ratio: 0.44  AORTA Ao Root diam: 3.00 cm Ao Asc diam:  3.20 cm MITRAL VALVE                TRICUSPID VALVE MV Area (PHT): 4.31 cm     TR Peak grad:   26.0 mmHg MV Area VTI:   1.94 cm     TR Vmax:        255.00 cm/s MV Peak grad:  8.6 mmHg MV Mean grad:  3.0 mmHg      SHUNTS MV Vmax:       1.47 m/s     Systemic VTI:  0.24 m MV Vmean:      77.3 cm/s    Systemic Diam: 1.90 cm MV Decel Time: 176 msec MV E velocity: 125.00 cm/s MV A velocity: 50.80 cm/s MV E/A ratio:  2.46 Chilton Si MD Electronically signed by Chilton Si MD Signature Date/Time: 01/20/2023/3:20:55 PM    Final    CT CHEST ABDOMEN PELVIS W CONTRAST  Result Date: 01/19/2023 CLINICAL  DATA:  Sepsis.  Abdominal pain for 2 weeks with fever. EXAM: CT CHEST, ABDOMEN, AND PELVIS WITH CONTRAST TECHNIQUE: Multidetector CT imaging of the chest, abdomen and pelvis was performed following the standard protocol during bolus administration of intravenous contrast. RADIATION DOSE REDUCTION: This exam was performed according to the departmental dose-optimization program which includes automated exposure control, adjustment of the mA and/or kV according to patient size and/or use of iterative reconstruction technique. CONTRAST:  85mL OMNIPAQUE IOHEXOL 300 MG/ML  SOLN COMPARISON:  CTA abdomen and pelvis 01/21/2020. Chest CTA 09/24/2015 FINDINGS: CT CHEST FINDINGS Cardiovascular: Heart is nonenlarged. Trace pericardial fluid. Status post median sternotomy with prosthetic aortic valve. Also graft material along the ascending thoracic aorta. Mediastinum/Nodes: Normal caliber thoracic esophagus. Esophagus is mildly patulous. Preserved thyroid gland. No specific abnormal lymph node enlargement identified in the axillary region, hilum or mediastinum. Lungs/Pleura: There is some linear opacity seen along bases likely scar or atelectasis. No consolidation, pneumothorax or effusion. Mild breathing motion. Musculoskeletal: Scattered degenerative changes of the thoracic spine. CT ABDOMEN PELVIS FINDINGS Hepatobiliary: Stones in the nondilated gallbladder. No enhancing liver mass. Patent portal vein. Pancreas: Unremarkable. No pancreatic ductal dilatation or surrounding inflammatory changes. Spleen: Normal in size without focal  abnormality. Adrenals/Urinary Tract: The adrenal glands are preserved. No enhancing renal mass or collecting system dilatation. Nonspecific perinephric stranding. Preserved contours of the urinary bladder but the bladder wall slightly thickened. Please correlate with any symptoms. Stomach/Bowel: On this non oral contrast exam, the large bowel has a normal course and caliber with scattered stool. Normal appendix extends medial to the cecum in the right lower quadrant. Second portion duodenal diverticula. Third portion duodenal diverticula also seen. Small bowel overall is nondilated. Vascular/Lymphatic: Aortic atherosclerosis. No enlarged abdominal or pelvic lymph nodes. Retroaortic left renal vein. Reproductive: Prostate is unremarkable. Other: No free air or free fluid. Musculoskeletal: Scattered degenerative changes of the spine and pelvis. There are some areas of canal stenosis along the lower lumbar spine with posterior disc bulging and osteophyte formation. Transitional lumbosacral segment. IMPRESSION: No consolidation, pneumothorax or effusion. 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 No bowel obstruction, free air or free fluid. Normal appendix. Scattered stool. Gallstones in the nondilated gallbladder. Fatty liver infiltration. Slight wall thickening of the urinary bladder, nonspecific. Please correlate with any symptoms of a subtle cystitis. Electronically Signed   By: Karen Kays M.D.   On: 01/19/2023 10:18   CT Head Wo Contrast  Result Date: 01/19/2023 CLINICAL DATA:  Mental status change for 2 weeks. EXAM: CT HEAD WITHOUT CONTRAST TECHNIQUE: Contiguous axial images were obtained from the base of the skull through the vertex without intravenous contrast. RADIATION DOSE REDUCTION: This exam was performed according to the departmental dose-optimization program which includes automated exposure control, adjustment of the mA and/or kV according to patient size and/or  use of iterative reconstruction technique. COMPARISON:  Head CT 09/14/2013 FINDINGS: Brain: Small low densities in the bilateral cerebellum, convincing even when accounting for motion artifact. No evidence of supratentorial infarct. No hemorrhage, hydrocephalus, collection. Vascular: No hyperdense vessel or unexpected calcification. Skull: Normal. Negative for fracture or focal lesion. Sinuses/Orbits: No acute finding. 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 Motion degraded head CT especially at the ventral brain. Electronically Signed   By: Tiburcio Pea M.D.   On: 01/19/2023 10:08   US Abdomen Complete  Result Date: 01/19/2023 CLINICAL DATA:  Cholelithiasis, elevated lipase, question obstruction EXAM: ABDOMEN ULTRASOUND COMPLETE COMPARISON:  03/19/2022 FINDINGS: Gallbladder: Multiple small  shadowing calculi in gallbladder up to 5 mm diameter. Associated sludge. No gallbladder wall thickening, pericholecystic fluid, or sonographic Murphy sign. Common bile duct: Diameter: 6 mm, normal for age Liver: Heterogeneously increased echogenicity, likely fatty infiltration, though can be seen with cirrhosis and some infiltrative disorders. No mass or nodularity. Portal vein is patent on color Doppler imaging with normal direction of blood flow towards the liver. IVC: Normal appearance Pancreas: Normal appearance.  No mass or ductal dilatation. Spleen: Normal echogenicity. 13.3 cm greatest diameter. No focal abnormalities. Right Kidney: Length: 12.8 cm. Normal cortical thickness and echogenicity. Tiny cyst inferior pole 9 mm diameter, simple features; no follow-up imaging recommended. No additional mass or hydronephrosis. Left Kidney: Length: 12.6 cm. Normal morphology without mass or hydronephrosis. Abdominal aorta: Normal caliber Other findings: No free fluid IMPRESSION: Cholelithiasis and sludge within gallbladder without evidence of acute cholecystitis or biliary dilatation. Probable fatty  infiltration of liver as above. Remainder of exam unremarkable. Electronically Signed   By: Ulyses Southward M.D.   On: 01/19/2023 09:02    ROS ROS: I have reviewed the patient's review of systems thoroughly and there are no positive responses as relates to the HPI.  Blood pressure 108/62, pulse 76, temperature (!) 97.3 F (36.3 C), resp. rate 16, height 5\' 7"  (1.702 m), weight 77.3 kg, SpO2 95 %. Physical Exam Well-developed well-nourished patient in no acute distress. Alert and oriented x3 HEENT:within normal limits Cardiac: Regular rate and rhythm Pulmonary: Lungs clear to auscultation Abdomen: Soft and nontender.  Normal active bowel sounds  Musculoskeletal: (Right knee: Obvious 2+ effusion.  No instability.  Mild pain through range of motion.  Mild warmth.  No erythema.)  Assessment/Plan: 62 year old male 5 years status post total knee replacement who unfortunately had bacteremia and has effusion in his knee.  After verbal consent the knee was aspirated for 60 cc of cloudy fluid.  I have sent it off for Gram stain, culture, and cell counts.  I am very much concerned that he could have an infection in the knee.  We will be guided by the cell counts and possibly Gram stain and culture.  If he has an infection that is age-indeterminate but seems to have just come on in the last couple of weeks I think would be reasonable to try open synovectomy washout and poly swap.  I think given that he is on Coumadin we will need to get him off of the Coumadin and he will probably need bridging Lovenox treatment going into the surgical intervention.  I am thinking this will likely need to be at least Sunday and given that is the case I probably would wait till Monday for the surgical intervention.  I will talk to his treatment team about this once we get better knowledge of the fluid.  I guess it is possible he could just have an effusion unrelated to the process but I am very much concerned at this point.  I  will plan on seeing him and having discussions with the treatment team going forward.  I can be reached on my cell phone if there is any questions about the patient.  The number is [phone].  Harvie Junior 01/20/2023, 7:02 PM

## 2023-01-20 NOTE — Procedures (Signed)
Procedure: Right knee aspiration and injection   Indication: Right knee effusion(s)   Surgeon: Charma Igo, PA-C   Assist: None   Anesthesia: Topical refrigerant   EBL: None   Complications: Dry tap   Findings: After risks/benefits explained patient desires to undergo procedure. Consent obtained and time out performed. The right knee was sterilely prepped and aspirated. Unable to aspirate any fluid. Pt tolerated the procedure well.       Dennis Caldron, PA-C Orthopedic Surgery [phone]

## 2023-01-20 NOTE — ED Notes (Signed)
PA Jeffery at bedside to drain right knee

## 2023-01-20 NOTE — ED Triage Notes (Signed)
Pt in with abdominal pain x 2 weeks, severe HA tonight pt just seen at [facility] last night, had +blood cx's and told to come to nearest ED. Hypotensive during triage 76/64

## 2023-01-20 NOTE — Consult Note (Signed)
Reason for Consult:Right knee pain Referring Physician: Jonah Blue, MD Time called: 0945 Time at bedside: 0956   Dennis Bates is an 62 y.o. male.  HPI: Dennis Bates comes in with a 2 week hx/o fevers, etc. And was dx with bacteremia. He also has had right knee pain for about a week. He denies any antecedent event and hasn't had any problems with the knee since TKA 5y ago.  Past Medical History:  Diagnosis Date   Aortic stenosis due to bicuspid aortic valve 09/2014   Severe aortic stenosis of bicuspid valve - s/pAVR with Bent[place] ( 09/2015)   Bell's palsy    LAST EPISODE 09-2015   CAD S/P percutaneous coronary angioplasty 09/20/2014   a. inferolat STEMI ->> LHC-Angio: Proximal/ostial LAD 20%, OM2 99% -->> PCI: 3mm x 16 mm Promus Premier DES to the OM2.(~3.5 mm)   Diabetes mellitus type 2, controlled, with complications (HCC)    History of mechanical aortic valve replacement 10/09/2015   after 1 yr DAPT for Inferolateral STEMI. (Dr. Laneta Simmers)   Hyperlipidemia    Hypertension    Hypertriglyceridemia    Shoulder arthritis 07/11/2020   Rt Severe GH DJD xray Sep 2021   ST elevation myocardial infarction (STEMI) of inferior wall (HCC) 09/20/2014   99 % occluded Cx-OM2 Promus DES 3.0 mm x 16 mm (3.5 mm)    Past Surgical History:  Procedure Laterality Date   BENTALL PROCEDURE N/A 10/09/2015   Procedure: BENTALL PROCEDURE (using a St Jude mechanical valve, size 23);  Surgeon: Alleen Borne, MD;  Location: [facility] OR;  Service: Open Heart Surgery;  Laterality: N/A;  CIRC ARRESTNEEDS RIGHT RADIAL A-LINE   COLONOSCOPY WITH PROPOFOL N/A 01/21/2020   Procedure: COLONOSCOPY WITH PROPOFOL;  Surgeon: Lynann Bologna, MD;  Location: [facility] ENDOSCOPY;  Service: Endoscopy;  Laterality: N/A;   ENTEROSCOPY N/A 01/24/2020   Procedure: ENTEROSCOPY;  Surgeon: Sherrilyn Rist, MD;  Location: [facility] ENDOSCOPY;  Service: Gastroenterology;  Laterality: N/A;   ESOPHAGOGASTRODUODENOSCOPY (EGD) WITH PROPOFOL N/A 01/21/2020    Procedure: ESOPHAGOGASTRODUODENOSCOPY (EGD) WITH PROPOFOL;  Surgeon: Lynann Bologna, MD;  Location: [facility] ENDOSCOPY;  Service: Endoscopy;  Laterality: N/A;   GIVENS CAPSULE STUDY N/A 01/22/2020   Procedure: GIVENS CAPSULE STUDY;  Surgeon: Sherrilyn Rist, MD;  Location: [facility] ENDOSCOPY;  Service: Gastroenterology;  Laterality: N/A;   KNEE ARTHROSCOPY W/ ACL RECONSTRUCTION Right    90's; "2 ATHROSCOPIC , 2 REBUILDS"   LEFT HEART CATHETERIZATION WITH CORONARY ANGIOGRAM N/A 09/20/2014   Procedure: LEFT HEART CATHETERIZATION WITH CORONARY ANGIOGRAM;  Surgeon: Marykay Lex, MD;  Location: [facility] CATH LAB;  Proximal/ostial LAD 20%, OM2 99% -->> aspiration thrombectomy and DES PCI:    PERCUTANEOUS CORONARY STENT INTERVENTION (PCI-S)  09/20/2014   Aspiration thrombectomy and DES PCI- OM 2(m-dCx): Promus Premier DES 3.0 mm x 16 mm (3.5 mm)   RIGHT/LEFT HEART CATH AND CORONARY ANGIOGRAPHY  09/2015   Dr. Nicki Guadalajara: Widely patent OM 2 stent.  Mild disease in RCA and LAD.Relatively normal right heart cath pressures.  Normal cardiac output.   TEE WITHOUT CARDIOVERSION N/A 10/09/2015   Procedure: TRANSESOPHAGEAL ECHOCARDIOGRAM (TEE);  Surgeon: Alleen Borne, MD;  Location: [facility] OR;  Service: Open Heart Surgery;  Laterality: N/A;   TOTAL KNEE ARTHROPLASTY Right 12/09/2017   Procedure: RIGHT TOTAL KNEE ARTHROPLASTY REMOVAL OF HARDWARE RIGHT KNEE;  Surgeon: Jodi Geralds, MD;  Location: [facility] ORS;  Service: Orthopedics;  Laterality: Right;   TRANSTHORACIC ECHOCARDIOGRAM  02/2016   Post AVR: mechanical: AVR without  obstuction. LVEF improved to 65-70%.   TRANSTHORACIC ECHOCARDIOGRAM  09/2014, 10/2014   Probable bicuspid aortic valve: a. mod-sev by 2D ECHO 09/2014. AVA 0.9cm^2; b) 10/2014 Echo:. Severe AS Mn-Pk Gradient 41-71 mmHg, AVA ~0.79 cm2, EF 60-65%   TRANSTHORACIC ECHOCARDIOGRAM  12/2019    Normal WM. Gr 2 DD. Mild LA dilation. 23 mm St. Jude Mechanical AoV well seated.  Trivial AI.     Family History  Problem Relation  Age of Onset   Heart disease Mother    CAD Neg Hx        per wife    Social History:  reports that he has never smoked. He quit smokeless tobacco use about 37 years ago.  His smokeless tobacco use included chew. He reports current alcohol use. He reports that he does not use drugs.  Allergies: No Known Allergies  Medications: I have reviewed the patient's current medications.  Results for orders placed or performed during the hospital encounter of 01/20/23 (from the past 48 hour(s))  Lipase, blood     Status: Abnormal   Collection Time: 01/20/23  4:18 AM  Result Value Ref Range   Lipase 115 (H) 11 - 51 U/L    Comment: Performed at [facility], [address] [place], [place] [address]  Comprehensive metabolic panel     Status: Abnormal   Collection Time: 01/20/23  4:18 AM  Result Value Ref Range   Sodium 130 (L) 135 - 145 mmol/L   Potassium 3.7 3.5 - 5.1 mmol/L   Chloride 96 (L) 98 - 111 mmol/L   CO2 21 (L) 22 - 32 mmol/L   Glucose, Bld 134 (H) 70 - 99 mg/dL    Comment: Glucose reference range applies only to samples taken after fasting for at least 8 hours.   BUN 15 8 - 23 mg/dL   Creatinine, Ser 3.24 0.61 - 1.24 mg/dL   Calcium 9.1 8.9 - 40.1 mg/dL   Total Protein 6.2 (L) 6.5 - 8.1 g/dL   Albumin 3.0 (L) 3.5 - 5.0 g/dL   AST 28 15 - 41 U/L   ALT 26 0 - 44 U/L   Alkaline Phosphatase 41 38 - 126 U/L   Total Bilirubin 1.1 0.3 - 1.2 mg/dL   GFR, Estimated >02 >72 mL/min    Comment: (NOTE) Calculated using the CKD-EPI Creatinine Equation (2021)    Anion gap 13 5 - 15    Comment: Performed at [facility], [address] [place], [place] [address]  CBC with Differential     Status: Abnormal   Collection Time: 01/20/23  4:18 AM  Result Value Ref Range   WBC 9.5 4.0 - 10.5 K/uL   RBC 4.08 (L) 4.22 - 5.81 MIL/uL   Hemoglobin 11.9 (L) 13.0 - 17.0 g/dL   HCT 40.3 (L) 47.4 - 25.9 %   MCV 84.1 80.0 - 100.0 fL   MCH 29.2 26.0 - 34.0 pg   MCHC 34.7 30.0 - 36.0  g/dL   RDW 56.3 87.5 - 64.3 %   Platelets 234 150 - 400 K/uL   nRBC 0.0 0.0 - 0.2 %   Neutrophils Relative % 79 %   Neutro Abs 7.5 1.7 - 7.7 K/uL   Lymphocytes Relative 8 %   Lymphs Abs 0.8 0.7 - 4.0 K/uL   Monocytes Relative 12 %   Monocytes Absolute 1.1 (H) 0.1 - 1.0 K/uL   Eosinophils Relative 0 %   Eosinophils Absolute 0.0 0.0 - 0.5  K/uL   Basophils Relative 1 %   Basophils Absolute 0.1 0.0 - 0.1 K/uL   Immature Granulocytes 0 %   Abs Immature Granulocytes 0.03 0.00 - 0.07 K/uL    Comment: Performed at [facility], [address], [place], [place] [address]  Protime-INR     Status: Abnormal   Collection Time: 01/20/23  4:18 AM  Result Value Ref Range   Prothrombin Time 21.0 (H) 11.4 - 15.2 seconds   INR 1.8 (H) 0.8 - 1.2    Comment: (NOTE) INR goal varies based on device and disease states. Performed at [facility], [address] [place], [place] [address]     CT CHEST ABDOMEN PELVIS W CONTRAST  Result Date: 01/19/2023 CLINICAL DATA:  Sepsis.  Abdominal pain for 2 weeks with fever. EXAM: CT CHEST, ABDOMEN, AND PELVIS WITH CONTRAST TECHNIQUE: Multidetector CT imaging of the chest, abdomen and pelvis was performed following the standard protocol during bolus administration of intravenous contrast. RADIATION DOSE REDUCTION: This exam was performed according to the departmental dose-optimization program which includes automated exposure control, adjustment of the mA and/or kV according to patient size and/or use of iterative reconstruction technique. CONTRAST:  85mL OMNIPAQUE IOHEXOL 300 MG/ML  SOLN COMPARISON:  CTA abdomen and pelvis 01/21/2020. Chest CTA 09/24/2015 FINDINGS: CT CHEST FINDINGS Cardiovascular: Heart is nonenlarged. Trace pericardial fluid. Status post median sternotomy with prosthetic aortic valve. Also graft material along the ascending thoracic aorta. Mediastinum/Nodes: Normal caliber thoracic esophagus. Esophagus is mildly patulous. Preserved thyroid  gland. No specific abnormal lymph node enlargement identified in the axillary region, hilum or mediastinum. Lungs/Pleura: There is some linear opacity seen along bases likely scar or atelectasis. No consolidation, pneumothorax or effusion. Mild breathing motion. Musculoskeletal: Scattered degenerative changes of the thoracic spine. CT ABDOMEN PELVIS FINDINGS Hepatobiliary: Stones in the nondilated gallbladder. No enhancing liver mass. Patent portal vein. Pancreas: Unremarkable. No pancreatic ductal dilatation or surrounding inflammatory changes. Spleen: Normal in size without focal abnormality. Adrenals/Urinary Tract: The adrenal glands are preserved. No enhancing renal mass or collecting system dilatation. Nonspecific perinephric stranding. Preserved contours of the urinary bladder but the bladder wall slightly thickened. Please correlate with any symptoms. Stomach/Bowel: On this non oral contrast exam, the large bowel has a normal course and caliber with scattered stool. Normal appendix extends medial to the cecum in the right lower quadrant. Second portion duodenal diverticula. Third portion duodenal diverticula also seen. Small bowel overall is nondilated. Vascular/Lymphatic: Aortic atherosclerosis. No enlarged abdominal or pelvic lymph nodes. Retroaortic left renal vein. Reproductive: Prostate is unremarkable. Other: No free air or free fluid. Musculoskeletal: Scattered degenerative changes of the spine and pelvis. There are some areas of canal stenosis along the lower lumbar spine with posterior disc bulging and osteophyte formation. Transitional lumbosacral segment. IMPRESSION: No consolidation, pneumothorax or effusion. 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 No bowel obstruction, free air or free fluid. Normal appendix. Scattered stool. Gallstones in the nondilated gallbladder. Fatty liver infiltration. Slight wall thickening of the urinary bladder, nonspecific. Please  correlate with any symptoms of a subtle cystitis. Electronically Signed   By: Karen Kays M.D.   On: 01/19/2023 10:18   CT Head Wo Contrast  Result Date: 01/19/2023 CLINICAL DATA:  Mental status change for 2 weeks. EXAM: CT HEAD WITHOUT CONTRAST TECHNIQUE: Contiguous axial images were obtained from the base of the skull through the vertex without intravenous contrast. RADIATION DOSE REDUCTION: This exam was performed according to the  departmental dose-optimization program which includes automated exposure control, adjustment of the mA and/or kV according to patient size and/or use of iterative reconstruction technique. COMPARISON:  Head CT 09/14/2013 FINDINGS: Brain: Small low densities in the bilateral cerebellum, convincing even when accounting for motion artifact. No evidence of supratentorial infarct. No hemorrhage, hydrocephalus, collection. Vascular: No hyperdense vessel or unexpected calcification. Skull: Normal. Negative for fracture or focal lesion. Sinuses/Orbits: No acute finding. 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 Motion degraded head CT especially at the ventral brain. Electronically Signed   By: Tiburcio Pea M.D.   On: 01/19/2023 10:08   US Abdomen Complete  Result Date: 01/19/2023 CLINICAL DATA:  Cholelithiasis, elevated lipase, question obstruction EXAM: ABDOMEN ULTRASOUND COMPLETE COMPARISON:  03/19/2022 FINDINGS: Gallbladder: Multiple small shadowing calculi in gallbladder up to 5 mm diameter. Associated sludge. No gallbladder wall thickening, pericholecystic fluid, or sonographic Murphy sign. Common bile duct: Diameter: 6 mm, normal for age Liver: Heterogeneously increased echogenicity, likely fatty infiltration, though can be seen with cirrhosis and some infiltrative disorders. No mass or nodularity. Portal vein is patent on color Doppler imaging with normal direction of blood flow towards the liver. IVC: Normal appearance Pancreas: Normal appearance.  No  mass or ductal dilatation. Spleen: Normal echogenicity. 13.3 cm greatest diameter. No focal abnormalities. Right Kidney: Length: 12.8 cm. Normal cortical thickness and echogenicity. Tiny cyst inferior pole 9 mm diameter, simple features; no follow-up imaging recommended. No additional mass or hydronephrosis. Left Kidney: Length: 12.6 cm. Normal morphology without mass or hydronephrosis. Abdominal aorta: Normal caliber Other findings: No free fluid IMPRESSION: Cholelithiasis and sludge within gallbladder without evidence of acute cholecystitis or biliary dilatation. Probable fatty infiltration of liver as above. Remainder of exam unremarkable. Electronically Signed   By: Ulyses Southward M.D.   On: 01/19/2023 09:02    Review of Systems  Constitutional:  Positive for chills, diaphoresis and fever.  HENT:  Negative for ear discharge, ear pain, hearing loss and tinnitus.   Eyes:  Negative for photophobia and pain.  Respiratory:  Negative for cough and shortness of breath.   Cardiovascular:  Negative for chest pain.  Gastrointestinal:  Positive for nausea and vomiting. Negative for abdominal pain.  Genitourinary:  Negative for dysuria, flank pain, frequency and urgency.  Musculoskeletal:  Positive for arthralgias (Right knee). Negative for back pain, myalgias and neck pain.  Neurological:  Negative for dizziness and headaches.  Hematological:  Does not bruise/bleed easily.  Psychiatric/Behavioral:  The patient is not nervous/anxious.    Blood pressure 120/70, pulse 75, temperature 99.3 F (37.4 C), temperature source Oral, resp. rate 20, height 5\' 7"  (1.702 m), weight 77.3 kg, SpO2 95 %. Physical Exam Constitutional:      General: He is not in acute distress.    Appearance: He is well-developed. He is not diaphoretic.  HENT:     Head: Normocephalic and atraumatic.  Eyes:     General: No scleral icterus.       Right eye: No discharge.        Left eye: No discharge.     Conjunctiva/sclera:  Conjunctivae normal.  Cardiovascular:     Rate and Rhythm: Normal rate and regular rhythm.  Pulmonary:     Effort: Pulmonary effort is normal. No respiratory distress.  Musculoskeletal:     Cervical back: Normal range of motion.     Comments: RLE No traumatic wounds, ecchymosis, or rash  Mod TTP knee, mod pain with AROM/PROM but about 90 degrees of motion  Mod knee effusion  Knee stable to varus/ valgus and anterior/posterior stress  Sens DPN, SPN, TN intact  Motor EHL, ext, flex, evers 5/5  DP 2+, PT 2+, No significant edema  Skin:    General: Skin is warm and dry.  Neurological:     Mental Status: He is alert.  Psychiatric:        Mood and Affect: Mood normal.        Behavior: Behavior normal.     Assessment/Plan: Right knee pain -- Exam not very convincing for septic arthritis and dry tap likely rules out. Dr. Luiz Blare to examine this afternoon. No restrictions at this time.    Freeman Caldron, PA-C Orthopedic Surgery [phone] 01/20/2023, 10:26 AM

## 2023-01-21 ENCOUNTER — Other Ambulatory Visit: Payer: Self-pay | Admitting: Orthopedic Surgery

## 2023-01-21 ENCOUNTER — Telehealth: Payer: Self-pay

## 2023-01-21 ENCOUNTER — Encounter (HOSPITAL_COMMUNITY): Payer: Self-pay | Admitting: Internal Medicine

## 2023-01-21 ENCOUNTER — Inpatient Hospital Stay (HOSPITAL_COMMUNITY): Payer: Managed Care, Other (non HMO) | Admitting: Anesthesiology

## 2023-01-21 ENCOUNTER — Inpatient Hospital Stay: Payer: Managed Care, Other (non HMO) | Admitting: Urgent Care

## 2023-01-21 ENCOUNTER — Inpatient Hospital Stay (HOSPITAL_COMMUNITY): Payer: Managed Care, Other (non HMO)

## 2023-01-21 ENCOUNTER — Encounter (HOSPITAL_COMMUNITY)
Admission: EM | Disposition: A | Discharge status: Home / Self Care | Payer: Self-pay | Source: Home / Self Care | Attending: Internal Medicine

## 2023-01-21 DIAGNOSIS — E1165 Type 2 diabetes mellitus with hyperglycemia: Secondary | ICD-10-CM

## 2023-01-21 DIAGNOSIS — K8020 Calculus of gallbladder without cholecystitis without obstruction: Secondary | ICD-10-CM

## 2023-01-21 DIAGNOSIS — R7881 Bacteremia: Secondary | ICD-10-CM | POA: Diagnosis not present

## 2023-01-21 DIAGNOSIS — T8459XS Infection and inflammatory reaction due to other internal joint prosthesis, sequela: Secondary | ICD-10-CM | POA: Diagnosis not present

## 2023-01-21 DIAGNOSIS — I2510 Atherosclerotic heart disease of native coronary artery without angina pectoris: Secondary | ICD-10-CM

## 2023-01-21 DIAGNOSIS — I252 Old myocardial infarction: Secondary | ICD-10-CM | POA: Diagnosis not present

## 2023-01-21 DIAGNOSIS — Z96659 Presence of unspecified artificial knee joint: Secondary | ICD-10-CM

## 2023-01-21 DIAGNOSIS — I081 Rheumatic disorders of both mitral and tricuspid valves: Secondary | ICD-10-CM | POA: Diagnosis not present

## 2023-01-21 DIAGNOSIS — I10 Essential (primary) hypertension: Secondary | ICD-10-CM | POA: Diagnosis not present

## 2023-01-21 DIAGNOSIS — T8453XA Infection and inflammatory reaction due to internal right knee prosthesis, initial encounter: Secondary | ICD-10-CM

## 2023-01-21 DIAGNOSIS — I340 Nonrheumatic mitral (valve) insufficiency: Secondary | ICD-10-CM

## 2023-01-21 DIAGNOSIS — Z955 Presence of coronary angioplasty implant and graft: Secondary | ICD-10-CM

## 2023-01-21 DIAGNOSIS — B952 Enterococcus as the cause of diseases classified elsewhere: Secondary | ICD-10-CM | POA: Diagnosis not present

## 2023-01-21 DIAGNOSIS — Z7984 Long term (current) use of oral hypoglycemic drugs: Secondary | ICD-10-CM

## 2023-01-21 HISTORY — PX: TEE WITHOUT CARDIOVERSION: SHX5443

## 2023-01-21 LAB — GLUCOSE, CAPILLARY
Glucose-Capillary: 107 mg/dL — ABNORMAL HIGH (ref 70–99)
Glucose-Capillary: 173 mg/dL — ABNORMAL HIGH (ref 70–99)
Glucose-Capillary: 177 mg/dL — ABNORMAL HIGH (ref 70–99)

## 2023-01-21 LAB — CBC
HCT: 31.5 % — ABNORMAL LOW (ref 39.0–52.0)
Hemoglobin: 11.1 g/dL — ABNORMAL LOW (ref 13.0–17.0)
MCH: 29.1 pg (ref 26.0–34.0)
MCHC: 35.2 g/dL (ref 30.0–36.0)
MCV: 82.7 fL (ref 80.0–100.0)
Platelets: 246 10*3/uL (ref 150–400)
RBC: 3.81 MIL/uL — ABNORMAL LOW (ref 4.22–5.81)
RDW: 13.2 % (ref 11.5–15.5)
WBC: 6.7 10*3/uL (ref 4.0–10.5)
nRBC: 0 % (ref 0.0–0.2)

## 2023-01-21 LAB — BASIC METABOLIC PANEL
Anion gap: 13 (ref 5–15)
BUN: 13 mg/dL (ref 8–23)
CO2: 21 mmol/L — ABNORMAL LOW (ref 22–32)
Calcium: 8.8 mg/dL — ABNORMAL LOW (ref 8.9–10.3)
Chloride: 101 mmol/L (ref 98–111)
Creatinine, Ser: 0.97 mg/dL (ref 0.61–1.24)
GFR, Estimated: >60 mL/min (ref >60)
Glucose, Bld: 121 mg/dL — ABNORMAL HIGH (ref 70–99)
Potassium: 3.8 mmol/L (ref 3.5–5.1)
Sodium: 135 mmol/L (ref 135–145)

## 2023-01-21 LAB — PROTIME-INR
INR: 2.1 — ABNORMAL HIGH (ref 0.8–1.2)
Prothrombin Time: 24.2 seconds — ABNORMAL HIGH (ref 11.4–15.2)

## 2023-01-21 LAB — HEPARIN LEVEL (UNFRACTIONATED): Heparin Unfractionated: <0.1 IU/mL — ABNORMAL LOW (ref 0.30–0.70)

## 2023-01-21 LAB — HIV ANTIBODY (ROUTINE TESTING W REFLEX): HIV Screen 4th Generation wRfx: NONREACTIVE

## 2023-01-21 LAB — CULTURE, BLOOD (ROUTINE X 2): Special Requests: ADEQUATE

## 2023-01-21 SURGERY — ECHOCARDIOGRAM, TRANSESOPHAGEAL
Anesthesia: Monitor Anesthesia Care

## 2023-01-21 MED ORDER — PROPOFOL 500 MG/50ML IV EMUL
INTRAVENOUS | Status: DC | PRN
  Administered 2023-01-21: 150 ug/kg/min via INTRAVENOUS

## 2023-01-21 MED ORDER — HEPARIN (PORCINE) 25000 UT/250ML-% IV SOLN
2100.0000 [IU]/h | INTRAVENOUS | Status: DC
  Administered 2023-01-21: 1100 [IU]/h via INTRAVENOUS
  Administered 2023-01-22: 1600 [IU]/h via INTRAVENOUS
  Administered 2023-01-22: 1850 [IU]/h via INTRAVENOUS
  Administered 2023-01-23 (×2): 2100 [IU]/h via INTRAVENOUS
  Filled 2023-01-21 (×5): qty 250

## 2023-01-21 MED ORDER — PHENYLEPHRINE 80 MCG/ML (10ML) SYRINGE FOR IV PUSH (FOR BLOOD PRESSURE SUPPORT)
PREFILLED_SYRINGE | INTRAVENOUS | Status: DC | PRN
  Administered 2023-01-21 (×3): 160 ug via INTRAVENOUS

## 2023-01-21 MED ORDER — ADULT MULTIVITAMIN W/MINERALS CH
1.0000 | ORAL_TABLET | Freq: Every day | ORAL | Status: DC
  Administered 2023-01-21 – 2023-01-27 (×6): 1 via ORAL
  Filled 2023-01-21 (×6): qty 1

## 2023-01-21 MED ORDER — PROPOFOL 10 MG/ML IV BOLUS
INTRAVENOUS | Status: DC | PRN
  Administered 2023-01-21: 25 mg via INTRAVENOUS
  Administered 2023-01-21 (×2): 50 mg via INTRAVENOUS

## 2023-01-21 MED ORDER — LIDOCAINE 2% (20 MG/ML) 5 ML SYRINGE
INTRAMUSCULAR | Status: DC | PRN
  Administered 2023-01-21: 80 mg via INTRAVENOUS

## 2023-01-21 MED ORDER — SODIUM CHLORIDE 0.9 % IV SOLN: INTRAVENOUS | Status: DC | PRN

## 2023-01-21 NOTE — Anesthesia Preprocedure Evaluation (Addendum)
Anesthesia Evaluation  Patient identified by MRN, date of birth, ID band Patient awake    Reviewed: Allergy & Precautions, NPO status , Patient's Chart, lab work & pertinent test results, reviewed documented beta blocker date and time   History of Anesthesia Complications Negative for: history of anesthetic complications  Airway Mallampati: I  TM Distance: >3 FB Neck ROM: Full    Dental  (+) Dental Advisory Given   Pulmonary neg pulmonary ROS   breath sounds clear to auscultation       Cardiovascular hypertension, Pt. on medications and Pt. on home beta blockers (-) angina + CAD, + Past MI and + Cardiac Stents (DES OM2)   Rhythm:Regular Rate:Normal  01/20/2023 ECHO: EF 55-60%, normal LVF, mod LVH, Grade 2 DD, normal RVF, St Jude prosthetic valve in aortic position, Aortic valve mean gradient measures 14.3 mmHg. Aortic valve peak gradient measures 27.2 mmHg. Aortic valve area, by VTI measures 1.24 cm, mild MR   Neuro/Psych H/o Bell's palsy    GI/Hepatic negative GI ROS, Neg liver ROS,,,  Endo/Other  diabetes (glu 107), Oral Hypoglycemic Agents    Renal/GU negative Renal ROS     Musculoskeletal   Abdominal   Peds  Hematology  (+) Blood dyscrasia (Hb 11.1), anemia Coumadin   Anesthesia Other Findings   Reproductive/Obstetrics                             Anesthesia Physical Anesthesia Plan  ASA: 3  Anesthesia Plan: MAC   Post-op Pain Management: Minimal or no pain anticipated   Induction:   PONV Risk Score and Plan: 1 and Treatment may vary due to age or medical condition  Airway Management Planned: Nasal Cannula and Natural Airway  Additional Equipment: None  Intra-op Plan:   Post-operative Plan:   Informed Consent: I have reviewed the patients History and Physical, chart, labs and discussed the procedure including the risks, benefits and alternatives for the proposed anesthesia  with the patient or authorized representative who has indicated his/her understanding and acceptance.     Dental advisory given  Plan Discussed with: CRNA and Surgeon  Anesthesia Plan Comments:        Anesthesia Quick Evaluation

## 2023-01-21 NOTE — TOC Progression Note (Signed)
Transition of Care [facility]) - Progression Note    Patient Details  Name: Dennis Bates MRN: [id number] Date of Birth: 02-Dec-1960  Transition of Care [facility]) CM/SW Contact  Gordy Clement, RN Phone Number: 01/21/2023, 11:03 AM  Clinical Narrative:    Patient reviewed in Progression this am. Will DC to home on IVABX  [facility] has been contacted with referral . [facility] will provide SN services.  PT and OT still have to eval patient . TOC will follow for recs      Expected Discharge Plan: Home w Home Health Services (possible IVABX) Barriers to Discharge: Continued Medical Work up  Expected Discharge Plan and Services                                               Social Determinants of Health (SDOH) Interventions SDOH Screenings   Food Insecurity: No Food Insecurity (01/20/2023)  Housing: Low Risk  (01/20/2023)  Transportation Needs: No Transportation Needs (01/20/2023)  Utilities: Not At Risk (01/20/2023)  Depression (PHQ2-9): Low Risk  (01/17/2023)  Tobacco Use: Medium Risk (01/20/2023)    Readmission Risk Interventions     No data to display

## 2023-01-21 NOTE — Telephone Encounter (Signed)
Attempted to reach pt regarding to his appt if we need to reschedule it. Left a voicemail to call us back.

## 2023-01-21 NOTE — Progress Notes (Signed)
ANTICOAGULATION CONSULT NOTE  Pharmacy Consult for heparin Indication: mechanical aortic valve  No Known Allergies  Patient Measurements: Height: 5\' 7"  (170.2 cm) Weight: 77.3 kg (170 lb 6.7 oz) IBW/kg (Calculated) : 66.1 Heparin Dosing Weight: 77.3  Vital Signs: Temp: 98.2 F (36.8 C) (05/10 2006) Temp Source: Oral (05/10 2006) BP: 120/66 (05/10 2006) Pulse Rate: 70 (05/10 2006)  Labs: Recent Labs    01/19/23 0846 01/19/23 0935 01/19/23 1208 01/20/23 0418 01/21/23 0546 01/21/23 2003  HGB 13.3  --   --  11.9* 11.1*  --   HCT 38.9*  --   --  34.3* 31.5*  --   PLT 211  --   --  234 246  --   LABPROT  --  22.0*  --  21.0* 24.2*  --   INR  --  1.9*  --  1.8* 2.1*  --   HEPARINUNFRC  --   --   --   --   --  <0.10*  CREATININE 0.90  --   --  0.87 0.97  --   CKTOTAL  --  104  --   --   --   --   TROPONINIHS  --  9 12  --   --   --      Estimated Creatinine Clearance: 74.8 mL/min (by C-G formula based on SCr of 0.97 mg/dL).  Assessment: 62 yo male with h/o of mechanical aortic valve on warfarin PTA. Admitted with Enterococcus bacteremia. Patient on warfarin 10mg  daily except 15mg  on Mon and Thursdays per HML. Patient's INR was 4 on 5/6 (last dose of warfarin stated on 5/5). INR on admit is 1.8 (goal 2.5-3.5).   INR is subtherapeutic at 2.1 today. Pharmacy consulted to transition to IV heparin. Hgb down to 11.1, platelets are normal. TEE planned for today.   5/10 spoke with Sherrie George, RN from [facility] who manages warfarin about higher INR goal 2.5-3.5. It is unknown why goal was increased from 2-3 to 2.5-3.5 in 02/2020. He also had a GIB in 01/2020.  Heparin level < 0.10  Goal of Therapy:  INR 2.5-3.5 Monitor platelets by anticoagulation protocol: Yes   Plan:  Heparin drip to 1350 units / hr Daily heparin level, CBC Monitor for s/sx of bleeding Clarify the need for INR goal of 2.5-3.5  Thank you Okey Regal, PharmD  01/21/2023 8:58 PM

## 2023-01-21 NOTE — Evaluation (Signed)
Occupational Therapy Evaluation Patient Details Name: Dennis Bates MRN: [id number] DOB: 10-Jun-1961 Today's Date: 01/21/2023   History of Present Illness Pt is a 62 y.o. male admitted to [facility] 01/20/23 following 2-3 weeks hx R knee swelling and R knee, back, and abdominal pain. Pt prested to urgent care and found to be positive for enterococcal bacteremia. Admitted to [facility] for further evaluation. PMH of aortic stenosis-s/p AVR (mechanical valve) in 2016, CAD s/p PCI 2016, HLD, HTN, s/p right total knee arthroplasty 2019.   Clinical Impression   At baseline pt is Independent with ADLs, IADLs, functional transfers, and functional mobility and drives. Currently pt demonstrates ability to complete ADLs, functional transfers, and functional mobility Independent to Mod I with increased time needed for LB ADLs and functional transfers secondary to R knee pain. If knee pain persists, pt may benefit from use of tub transfer bench with leg extenders and Supervision from wife with tub transfers for increased safety as pt has a clawfoot tub. OT discussed these recommendations with pt and wife with both verbalizing understanding and agreement. No additional acute skilled OT services are indicated at this time. No post acute skilled OT services are indicated at this time.      Recommendations for follow up therapy are one component of a multi-disciplinary discharge planning process, led by the attending physician.  Recommendations may be updated based on patient status, additional functional criteria and insurance authorization.   Assistance Recommended at Discharge PRN  Patient can return home with the following Assistance with cooking/housework;Assist for transportation;Other (comment) (Supervision from wife with tub transfer as needed due to Right knee pain.)    Functional Status Assessment  Patient has not had a recent decline in their functional status (Pt requiring extra time secondary to pain, otherwise at  baseline.)  Equipment Recommendations  Other (comment) (Recommend tub transfer bench with outside leg extenders for use as needed due to R knee pain.)    Recommendations for Other Services       Precautions / Restrictions Precautions Precautions: Fall Restrictions Weight Bearing Restrictions: No      Mobility Bed Mobility Overal bed mobility: Modified Independent             General bed mobility comments: Extra time due to pain    Transfers Overall transfer level: Modified independent Equipment used: None                      Balance Overall balance assessment: Modified Independent                                         ADL either performed or assessed with clinical judgement   ADL Overall ADL's : Modified independent                                       General ADL Comments: Pt close to baseline. Requiring increased time with LB ADLs due to pain.     Vision Baseline Vision/History: 1 Wears glasses Ability to See in Adequate Light: 0 Adequate Patient Visual Report: No change from baseline       Perception     Praxis Praxis Praxis tested?: Within functional limits    Pertinent Vitals/Pain Pain Assessment Pain Assessment: Faces Faces Pain Scale: Hurts a little bit Pain Location:  R knee; Pt reports decreased pain in R knee today as compared to previous few days Pain Descriptors / Indicators: Discomfort, Grimacing, Guarding Pain Intervention(s): Limited activity within patient's tolerance, Monitored during session     Hand Dominance Right   Extremity/Trunk Assessment Upper Extremity Assessment Upper Extremity Assessment: Overall WFL for tasks assessed   Lower Extremity Assessment Lower Extremity Assessment: Defer to PT evaluation   Cervical / Trunk Assessment Cervical / Trunk Assessment: Normal   Communication Communication Communication: No difficulties   Cognition Arousal/Alertness:  Awake/alert Behavior During Therapy: WFL for tasks assessed/performed Overall Cognitive Status: Within Functional Limits for tasks assessed                                       General Comments  Pt is close to baseline and demonstrates ability to complete ADLs, functional transfers, and functional mobility with Mod I (extra time). VSS on RA. Wife present throughout eval.    Exercises     Shoulder Instructions      Home Living Family/patient expects to be discharged to:: Private residence Living Arrangements: Spouse/significant other Available Help at Discharge: Family Type of Home: House Home Access: Stairs to enter Secretary/administrator of Steps: 3 Entrance Stairs-Rails: None Home Layout: Two level Alternate Level Stairs-Number of Steps: Flight Alternate Level Stairs-Rails: Right Bathroom Shower/Tub: Tub/shower unit (Clawfoot tub)   Bathroom Toilet: Standard     Home Equipment: Shower seat;Hand held shower head          Prior Functioning/Environment Prior Level of Function : Independent/Modified Independent             Mobility Comments: Previously Independent ADLs Comments: Horticulturist, commercial, driving        OT Problem List:        OT Treatment/Interventions:      OT Goals(Current goals can be found in the care plan section) Acute Rehab OT Goals Patient Stated Goal: To return home with wife  OT Frequency:      Co-evaluation              AM-PAC OT "6 Clicks" Daily Activity     Outcome Measure Help from another person eating meals?: None Help from another person taking care of personal grooming?: None Help from another person toileting, which includes using toliet, bedpan, or urinal?: None Help from another person bathing (including washing, rinsing, drying)?: None Help from another person to put on and taking off regular upper body clothing?: None Help from another person to put on and taking off regular lower body  clothing?: None 6 Click Score: 24   End of Session Equipment Utilized During Treatment: Gait belt Nurse Communication: Mobility status  Activity Tolerance: Patient tolerated treatment well Patient left: in bed;with call bell/phone within reach;with bed alarm set;with family/visitor present                   Time: 1031-1056 OT Time Calculation (min): 25 min Charges:  OT General Charges $OT Visit: 1 Visit OT Evaluation $OT Eval Low Complexity: 1 Low  Aloni Chuang "Orson Eva., OTR/L, MA Acute Rehab [phone]   Lendon Colonel 01/21/2023, 1:15 PM

## 2023-01-21 NOTE — Progress Notes (Signed)
ANTICOAGULATION CONSULT NOTE  Pharmacy Consult for heparin Indication: mechanical aortic valve  No Known Allergies  Patient Measurements: Height: 5\' 7"  (170.2 cm) Weight: 77.3 kg (170 lb 6.7 oz) IBW/kg (Calculated) : 66.1 Heparin Dosing Weight: 77.3  Vital Signs: Temp: 97.6 F (36.4 C) (05/10 0738) Temp Source: Oral (05/10 0738) BP: 118/72 (05/10 0738) Pulse Rate: 65 (05/10 0738)  Labs: Recent Labs    01/19/23 0846 01/19/23 0935 01/19/23 1208 01/20/23 0418 01/21/23 0546  HGB 13.3  --   --  11.9* 11.1*  HCT 38.9*  --   --  34.3* 31.5*  PLT 211  --   --  234 246  LABPROT  --  22.0*  --  21.0* 24.2*  INR  --  1.9*  --  1.8* 2.1*  CREATININE 0.90  --   --  0.87 0.97  CKTOTAL  --  104  --   --   --   TROPONINIHS  --  9 12  --   --      Estimated Creatinine Clearance: 74.8 mL/min (by C-G formula based on SCr of 0.97 mg/dL).  Assessment: 62 yo male with h/o of mechanical aortic valve on warfarin PTA. Admitted with Enterococcus bacteremia. Patient on warfarin 10mg  daily except 15mg  on Mon and Thursdays per HML. Patient's INR was 4 on 5/6 (last dose of warfarin stated on 5/5). INR on admit is 1.8 (goal 2.5-3.5).   INR is subtherapeutic at 2.1 today. Pharmacy consulted to transition to IV heparin. Hgb down to 11.1, platelets are normal. TEE planned for today.   5/10 spoke with Sherrie George, RN from [facility] who manages warfarin about higher INR goal 2.5-3.5. It is unknown why goal was increased from 2-3 to 2.5-3.5 in 02/2020. He also had a GIB in 01/2020.  Goal of Therapy:  INR 2.5-3.5 Monitor platelets by anticoagulation protocol: Yes   Plan:  Begin heparin drip at 1100 units/hr without bolus 6h heparin level Daily heparin level, CBC Monitor for s/sx of bleeding Clarify the need for INR goal of 2.5-3.5  Thank you for involving pharmacy in this patient's care.  Loura Back, PharmD, BCPS Clinical Pharmacist Clinical phone for 01/21/2023 is  [phone] 01/21/2023 8:58 AM

## 2023-01-21 NOTE — Progress Notes (Signed)
Initial Nutrition Assessment  DOCUMENTATION CODES:   Not applicable  INTERVENTION:  Liberalize pt to regular diet due to recent poor appetite and increased needs  Multivitamin w/ minerals daily  NUTRITION DIAGNOSIS:   Increased nutrient needs related to acute illness as evidenced by estimated needs.  GOAL:   Patient will meet greater than or equal to 90% of their needs  MONITOR:   PO intake, Labs, I & O's  REASON FOR ASSESSMENT:   Consult  (Nutritional Goals)  ASSESSMENT:   62 y.o. male presented to the ED with abdominal pain x 2 weeks, poor PO intake, and nausea. PMH includes HTN, CAD, T2DM, and HLD. Pt admitted with enterococcus faecalis bacteremia.  5/09 - admitted 5/10 - TEE  Met with pt and wife in room. Reports that appetite has been very poor for ~1 week PTA. Reports that it has slowly started to come back, pt ordering lunch while in room. Pt reports that he was drinking Glucerna at home but had to stop because it had made his INR out of the range he needed. Shares that he enjoyed them, explained that it is consistent Vitamin K intake and no drastic changes to intake; RD recommend pt following up with physician that manages that level about continuing. Pt shared that he takes metformin and Jardiance at home to manage his diabetes.   Reports that his weight has remained stable between 166-169#. Shares that his physician would like for his weight to get down to 155#.   Discussed pt going on regular diet to help with increased needs due to acute illness and prepare for surgery next week. Reviewed the menu with pt.   Medications reviewed and include: NovoLog SSI, IV antibiotics  Labs reviewed: Sodium 135, Potassium 3.8, Hgb A1c 7%, 24 hr CBGs 107-148  NUTRITION - FOCUSED PHYSICAL EXAM: Deferred to follow-up.   Diet Order:   Diet Order             Diet regular Room service appropriate? Yes; Fluid consistency: Thin  Diet effective now                    EDUCATION NEEDS:   Education needs have been addressed  Skin:  Skin Assessment: Reviewed RN Assessment  Last BM:  5/10  Height:   Ht Readings from Last 1 Encounters:  01/20/23 5\' 7"  (1.702 m)    Weight:   Wt Readings from Last 1 Encounters:  01/20/23 77.3 kg    Ideal Body Weight:  70 kg  BMI:  Body mass index is 26.69 kg/m.  Estimated Nutritional Needs:  Kcal:  2000-2200 Protein:  100-120 grams Fluid:  >/= 2 L   Kirby Crigler RD, LDN Clinical Dietitian See [facility] for contact information.

## 2023-01-21 NOTE — Telephone Encounter (Signed)
Noted  

## 2023-01-21 NOTE — Telephone Encounter (Signed)
Received call from hospital pharmacist, [facility], [facility], concerning INR range for pt. INR range currently is 2.5-3.5 but was 2.0-3.0 with cardiology, before [facility] primary care took over management in 03/05/20.  Reviewing chart for need of INR at 2.5-3.5. PCP out of office until June. Unable to find reason for elevating INR range to 2.5-3.5 when [facility] primary care took over warfarin management.   Advised [place], [facility], this nurse will f/u with another provider at the PCP's office that will be looking at msgs for the PCP while he is out of the office. Advised will talk to that provider next week when in that office. She will inquire if cardiology will have input on INR range and f/u with coumadin clinic if any updates.   No ETA of pt d/c at this time.

## 2023-01-21 NOTE — Discharge Instructions (Signed)
TEE  YOU HAD AN CARDIAC PROCEDURE TODAY: Refer to the procedure report and other information in the discharge instructions given to you for any specific questions about what was found during the examination. If this information does not answer your questions, please call [facility] office at [phone] to clarify.   DIET: Your first meal following the procedure should be a light meal and then it is ok to progress to your normal diet. A half-sandwich or bowl of soup is an example of a good first meal. Heavy or fried foods are harder to digest and may make you feel nauseous or bloated. Drink plenty of fluids but you should avoid alcoholic beverages for 24 hours. If you had a esophageal dilation, please see attached instructions for diet.   ACTIVITY: Your care partner should take you home directly after the procedure. You should plan to take it easy, moving slowly for the rest of the day. You can resume normal activity the day after the procedure however YOU SHOULD NOT DRIVE, use power tools, machinery or perform tasks that involve climbing or major physical exertion for 24 hours (because of the sedation medicines used during the test).   SYMPTOMS TO REPORT IMMEDIATELY: A cardiologist can be reached at any hour. Please call [phone] for any of the following symptoms:  Vomiting of blood or coffee ground material  New, significant abdominal pain  New, significant chest pain or pain under the shoulder blades  Painful or persistently difficult swallowing  New shortness of breath  Black, tarry-looking or red, bloody stools  FOLLOW UP:  Please also call with any specific questions about appointments or follow up tests. Patient may be weightbearing as tolerated and he can do active, passive, and active assisted range of motion.  Patient should use CPM at home to attempt to maintain as much motion as possible.

## 2023-01-21 NOTE — CV Procedure (Signed)
Transesophageal Echo  Indication:   Bacteremia   Shared Decision Making/Informed Consent The risks [esophageal damage, perforation (1:10,000 risk), bleeding, pharyngeal hematoma as well as other potential complications associated with conscious sedation including aspiration, arrhythmia, respiratory failure and death], benefits (treatment guidance and diagnostic support) and alternatives of a transesophageal echocardiogram were discussed in detail with Mr. Crabb and he is willing to proceed.    Patient sedated by anesthesia with propofol intravenously Mouth guard placed  TEE probe advanced to mid esophagus without difficulty   TV normal   Mild TR MV normal   Very mild MR PV normal   No PI AV prosthesis with no obvious vegetation.   Trace AI  LVEF and RVEF normal  Mild plaquiing (fixed) in thoracic aorta  Procedure was without complication.  Full report to follow   [name] MD

## 2023-01-21 NOTE — Progress Notes (Signed)
Subjective:   The patient is seen in the vascular lab.  He just had his TEE.  He reports that since his aspiration late yesterday his knee feels better, but still does hurt somewhat.  It is still swollen.  He denies pain up into his thigh or hip today.  Denies fever, chills, or sweats. Objective: Vital signs in last 24 hours: Temp:  [97.6 F (36.4 C)-98.8 F (37.1 C)] 98.1 F (36.7 C) (05/10 1300) Pulse Rate:  [63-85] 63 (05/10 1312) Resp:  [15-26] 18 (05/10 1312) BP: (95-127)/(58-76) 111/76 (05/10 1312) SpO2:  [92 %-99 %] 95 % (05/10 1312)  Intake/Output from previous day: 05/09 0701 - 05/10 0700 In: 1474.4 [I.V.:878; IV Piggyback:596.4] Out: -  Intake/Output this shift: Total I/O In: 400 [I.V.:400] Out: -   Recent Labs    01/19/23 0846 01/20/23 0418 01/21/23 0546  HGB 13.3 11.9* 11.1*   Recent Labs    01/20/23 0418 01/21/23 0546  WBC 9.5 6.7  RBC 4.08* 3.81*  HCT 34.3* 31.5*  PLT 234 246   Recent Labs    01/20/23 0418 01/21/23 0546  NA 130* 135  K 3.7 3.8  CL 96* 101  CO2 21* 21*  BUN 15 13  CREATININE 0.87 0.97  GLUCOSE 134* 121*  CALCIUM 9.1 8.8*   Recent Labs    01/20/23 0418 01/21/23 0546  INR 1.8* 2.1*   Right knee exam: He still has a +1 effusion.  The knee is moderately warm.  It is not red.  He has mild pain through range of motion.  No instability.  His calf is soft.  He is nontender in his thigh or hip and has excellent range of motion of his hip without pain.  Right knee aspiration from yesterday showed no crystals.  The fluid was cloudy and had 54 K WBCs.  76% neutrophils.  Cultures showed no growth so far and were reincubated.  Assessment/Plan: 62 year old male who is 5 years status post right total knee replacement who had bacteremia and effusion in his knee.  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 Plan: Based upon his clinical and laboratory findings we will plan on surgical intervention for his right knee on Monday.  We  will plan an open washout of the right knee with extensive synovectomy and polyethylene swap. Medicine is converting him from warfarin and is currently on heparin.  Postoperatively we should be able to resume his warfarin on postop day 1.  Will allow the medicine service to manage this.  We appreciate ID's involvement/assistance in this case. He will need to be n.p.o. after midnight on Sunday. I discussed our thought process with the patient and he is ready to proceed on Monday.  We also discussed the risks and benefits of the surgery with the understanding that we possibly may not completely eradicate his knee infection.  The patient is understanding of this as well.  In the meantime it is okay for the patient to weight-bear as tolerated on the right knee and be out of bed in his room as tolerated.  Dennis Bates 01/21/2023, 1:31 PM  [phone]. Jodi Geralds, MD [phone]

## 2023-01-21 NOTE — Interval H&P Note (Signed)
History and Physical Interval Note:  01/21/2023 12:17 PM  Dennis Bates  has presented today for surgery, with the diagnosis of BACTOREMIA.  The various methods of treatment have been discussed with the patient and family. After consideration of risks, benefits and other options for treatment, the patient has consented to  Procedure(s): TRANSESOPHAGEAL ECHOCARDIOGRAM (N/A) as a surgical intervention.  The patient's history has been reviewed, patient examined, no change in status, stable for surgery.  I have reviewed the patient's chart and labs.  Questions were answered to the patient's satisfaction.     [name]

## 2023-01-21 NOTE — H&P (View-Only) (Signed)
                       PROGRESS NOTE        PATIENT DETAILS Name: Dennis Bates Age: 62 y.o. Sex: male Date of Birth: 06/07/1961 Admit Date: 01/20/2023 Admitting Physician Jennifer Yates, MD PCP:Burchette, Bruce W, MD  Brief Summary: Patient is a 62 y.o.  male with history of aortic stenosis-s/p AVR (mechanical valve) in 2016, CAD s/p PCI 2016, HLD, HTN, s/p right total knee arthroplasty 2019 who presented with 2-3 weeks history of subjective fever, abdominal pain, back pain, right knee swelling-she was seen at a local urgent care-and had blood cultures drawn which was subsequently positive, patient was then admitted to the hospitalist service for further evaluation and treatment.  Significant events: 5/8>> eval at [facility]-abdominal pain-subjective fever-blood cultures drawn. 5/9>> blood cultures positive for Enterococcus-referred to the ED-admit to [facility].  Significant studies: 5/8>> CT chest/abdomen/pelvis: No consolidation/pneumothorax/effusion-no bowel obstruction.  Gallstones in the nondilated gallbladder 5/8>> CT head: Age-indeterminate bilateral cerebellar infarcts 5/9>> x-ray right knee: Total knee arthroplasty-trace joint fluid 5/9>> echo: EF 55-60%-no obvious vegetation seen on prosthetic aortic valve.  Significant microbiology data: 5/8>> blood culture: Enterococcus faecalis 5/9>> synovial fluid culture: Pending 5/10>> blood culture: Pending  Procedures: 5/9>> arthrocentesis by orthopedics (WBC 54,250 with 76% neutrophils-no crystals)  Consults: [facility] Orthoped[facility]  Subjective: Lying comfortably in bed-denies any chest pain or shortness of breath.  Objective: Vitals: Blood pressure 118/72, pulse 65, temperature 97.6 F (36.4 C), temperature source Oral, resp. rate 18, height 5' 7" (1.702 m), weight 77.3 kg, SpO2 97 %.   Exam: Gen Exam:Alert awake-not in any distress HEENT:atraumatic, normocephalic Chest: B/L clear to auscultation  anteriorly CVS:S1S2 regular Abdomen:soft non tender, non distended Extremities:no edema.  Right knee-mildly swollen-no overlying erythema. 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 Orthopedics following-will likely require washout of the affected knee.  History of mechanical aortic valve replacement Coumadin on hold IV heparin until we sure that patient has completed all procedures/surgeries  History of CAD s/p PCI 2016 No anginal symptoms  DM-2 (A1c 7.0 on 5/6) CBG stable on SSI Resume metformin/Jardiance on discharge.  HTN BP stable on Coreg  HLD Statin/Lopid  Cholelithiasis Asymptomatic at this point-primary problem is enterococcal infection-he will require outpatient elective general surgery evaluation for cholecystectomy.  BMI: Estimated body mass index is 26.69 kg/m as calculated from the following:   Height as of this encounter: 5' 7" (1.702 m).   Weight as of this encounter: 77.3 kg.   Code status:   Code Status: Full Code   DVT Prophylaxis:IV heparin   Family Communication: Spouse at bedside   Disposition   Plan: Status is: Inpatient Remains inpatient appropriate because: Severity of illness   Planned Discharge Destination:Home   Diet: Diet Order             Diet NPO time specified  Diet effective  midnight                     Antimicrobial agents: Anti-infectives (From admission, onward)    Start     Dose/Rate Route Frequency Ordered Stop   01/20/23 1100  ampicillin (OMNIPEN) 2 g in sodium chloride 0.9 % 100 mL IVPB        2 g 300 mL/hr over 20 Minutes Intravenous Every 4 hours 01/20/23 0834     01/20/23 1100  cefTRIAXone (ROCEPHIN) 2 g in sodium chloride 0.9 % 100 mL IVPB        2 g 200 mL/hr over 30 Minutes Intravenous Every 12 hours 01/20/23 0834     01/20/23 0415  piperacillin-tazobactam (ZOSYN) IVPB 3.375 g        3.375 g 100 mL/hr over 30 Minutes Intravenous  Once 01/20/23 0403 01/20/23 0500        MEDICATIONS: Scheduled Meds:  carvedilol  3.125 mg Oral BID   gemfibrozil  600 mg Oral BID AC   insulin aspart  0-15 Units Subcutaneous TID WC   insulin aspart  0-5 Units Subcutaneous QHS   ondansetron (ZOFRAN) IV  4 mg Intravenous Once   rosuvastatin  20 mg Oral QHS   Continuous Infusions:  ampicillin (OMNIPEN) IV 2 g (01/21/23 0745)   cefTRIAXone (ROCEPHIN)  IV 2 g (01/20/23 2119)   heparin     lactated ringers 75 mL/hr at 01/21/23 0316   PRN Meds:.acetaminophen **OR** acetaminophen, hydrALAZINE, morphine injection, ondansetron **OR** ondansetron (ZOFRAN) IV, oxyCODONE   I have personally reviewed following labs and imaging studies  LABORATORY DATA: CBC: Recent Labs  Lab 01/17/23 1439 01/19/23 0846 01/20/23 0418 01/21/23 0546  WBC 12.8* 9.4 9.5 6.7  NEUTROABS 10.9*  --  7.5  --   HGB 14.1 13.3 11.9* 11.1*  HCT 41.3 38.9* 34.3* 31.5*  MCV 84.8 83.8 84.1 82.7  PLT 240.0 211 234 246    Basic Metabolic Panel: Recent Labs  Lab 01/17/23 1439 01/19/23 0846 01/20/23 0418 01/21/23 0546  NA 127* 132* 130* 135  K 4.1 4.2 3.7 3.8  CL 91* 97* 96* 101  CO2 23 23 21* 21*  GLUCOSE 97 156* 134* 121*  BUN 22 21 15 13  CREATININE 1.22 0.90 0.87 0.97  CALCIUM 9.8 9.7 9.1 8.8*    GFR: Estimated Creatinine Clearance: 74.8 mL/min (by C-G formula  based on SCr of 0.97 mg/dL).  Liver Function Tests: Recent Labs  Lab 01/17/23 1439 01/19/23 0846 01/20/23 0418  AST 23 28 28  ALT 30 25 26  ALKPHOS 40 38 41  BILITOT 1.2 1.4* 1.1  PROT 7.8 7.8 6.2*  ALBUMIN 4.6 4.3 3.0*   Recent Labs  Lab 01/17/23 1439 01/19/23 0846 01/20/23 0418  LIPASE 72.0* 166* 115*   Recent Labs  Lab 01/19/23 0934  AMMONIA 17    Coagulation Profile: Recent Labs  Lab 01/17/23 1439 01/19/23 0935 01/20/23 0418 01/21/23 0546  INR 4.0* 1.9* 1.8* 2.1*    Cardiac Enzymes: Recent Labs  Lab 01/19/23 0935  CKTOTAL 104    BNP (last 3 results) No results for input(s): "PROBNP" in the last 8760 hours.  Lipid Profile: No results for input(s): "CHOL", "HDL", "LDLCALC", "TRIG", "CHOLHDL", "LDLDIRECT" in the last 72 hours.    Thyroid Function Tests: No results for input(s): "TSH", "T4TOTAL", "FREET4", "T3FREE", "THYROIDAB" in the last 72 hours.  Anemia Panel: No results for input(s): "VITAMINB12", "FOLATE", "FERRITIN", "TIBC", "IRON", "RETICCTPCT" in the last 72 hours.  Urine analysis:    Component Value Date/Time   COLORURINE YELLOW 01/19/2023 1200   APPEARANCEUR CLEAR 01/19/2023 1200   LABSPEC >1.046 (H) 01/19/2023 1200   PHURINE 6.0 01/19/2023 1200   GLUCOSEU >1,000 (A) 01/19/2023 1200   HGBUR SMALL (A) 01/19/2023 1200   BILIRUBINUR NEGATIVE 01/19/2023 1200   KETONESUR 40 (A) 01/19/2023 1200   PROTEINUR TRACE (A) 01/19/2023 1200   NITRITE NEGATIVE 01/19/2023 1200   LEUKOCYTESUR NEGATIVE 01/19/2023 1200    Sepsis Labs: Lactic Acid, Venous    Component Value Date/Time   LATICACIDVEN 1.5 10/20/2015 2200    MICROBIOLOGY: Recent Results (from the past 240 hour(s))  Culture, blood (routine x 2)     Status: Abnormal (Preliminary result)   Collection Time: 01/19/23  9:34 AM   Specimen: BLOOD  Result Value Ref Range Status   Specimen Description   Final    BLOOD LEFT ARM Performed at [facility], [address], [place], [place] [address]    Special Requests   Final    BOTTLES DRAWN AEROBIC AND ANAEROBIC Blood Culture adequate volume Performed at [facility], [address], [place], [place] [address]    Culture  Setup Time   Final    GRAM POSITIVE COCCI IN BOTH AEROBIC AND ANAEROBIC BOTTLES CRITICAL RESULT CALLED TO, READ BACK BY AND VERIFIED WITH: KELLY GIBSON,RN@0039 01/20/23 MK    Culture (A)  Final    ENTEROCOCCUS FAECALIS CULTURE REINCUBATED FOR BETTER GROWTH Performed at [facility], [address] [place], [place] [address]    Report Status PENDING  Incomplete  Blood Culture ID Panel (Reflexed)     Status: Abnormal   Collection Time: 01/19/23  9:34 AM  Result Value Ref Range Status   Enterococcus faecalis DETECTED (A) NOT DETECTED Final    Comment: CRITICAL RESULT CALLED TO, READ BACK BY AND VERIFIED WITH: KELLY GIBSON,RN@0040 01/20/23 MK    Enterococcus Faecium NOT DETECTED NOT DETECTED Final   Listeria monocytogenes NOT DETECTED NOT DETECTED Final   Staphylococcus species NOT DETECTED NOT DETECTED Final   Staphylococcus aureus (BCID) NOT DETECTED NOT DETECTED Final   Staphylococcus epidermidis NOT DETECTED NOT DETECTED Final   Staphylococcus lugdunensis NOT DETECTED NOT DETECTED Final   Streptococcus species NOT DETECTED NOT DETECTED Final   Streptococcus agalactiae NOT DETECTED NOT DETECTED Final   Streptococcus pneumoniae NOT DETECTED NOT DETECTED Final   Streptococcus pyogenes NOT DETECTED NOT DETECTED Final   A.calcoaceticus-baumannii NOT DETECTED NOT DETECTED Final   Bacteroides fragilis NOT DETECTED NOT DETECTED Final   Enterobacterales NOT DETECTED NOT DETECTED Final   Enterobacter cloacae complex NOT DETECTED NOT DETECTED Final   Escherichia coli NOT DETECTED NOT DETECTED Final   Klebsiella aerogenes NOT DETECTED NOT DETECTED Final   Klebsiella oxytoca NOT DETECTED NOT DETECTED Final   Klebsiella pneumoniae NOT DETECTED NOT DETECTED  Final   Proteus species NOT DETECTED NOT DETECTED Final   Salmonella species NOT DETECTED NOT DETECTED Final   Serratia marcescens NOT DETECTED NOT DETECTED Final   Haemophilus influenzae NOT DETECTED NOT DETECTED Final   Neisseria meningitidis NOT DETECTED NOT DETECTED Final   Pseudomonas aeruginosa NOT DETECTED NOT DETECTED Final   Stenotrophomonas maltophilia NOT DETECTED NOT DETECTED Final   Candida albicans NOT DETECTED NOT DETECTED Final     Candida auris NOT DETECTED NOT DETECTED Final   Candida glabrata NOT DETECTED NOT DETECTED Final   Candida krusei NOT DETECTED NOT DETECTED Final   Candida parapsilosis NOT DETECTED NOT DETECTED Final   Candida tropicalis NOT DETECTED NOT DETECTED Final   Cryptococcus neoformans/gattii NOT DETECTED NOT DETECTED Final   Vancomycin resistance NOT DETECTED NOT DETECTED Final    Comment: Performed at [facility], [address] [place], [place] [address]  Culture, blood (routine x 2)     Status: Abnormal (Preliminary result)   Collection Time: 01/19/23 10:15 AM   Specimen: BLOOD  Result Value Ref Range Status   Specimen Description   Final    BLOOD BLOOD RIGHT FOREARM Performed at [facility], [address], [place], [place] [address]    Special Requests   Final    BOTTLES DRAWN AEROBIC AND ANAEROBIC Blood Culture adequate volume Performed at [facility], [address], [place], [place] [address]    Culture  Setup Time   Final    GRAM POSITIVE COCCI IN CHAINS IN BOTH AEROBIC AND ANAEROBIC BOTTLES Performed at [facility], [address] [place], [place] [address]    Culture ENTEROCOCCUS FAECALIS (A)  Final   Report Status PENDING  Incomplete  Resp panel by RT-PCR (RSV, Flu A&B, Covid) Anterior Nasal Swab     Status: None   Collection Time: 01/19/23 12:08 PM   Specimen: Anterior Nasal Swab  Result Value Ref Range Status   SARS Coronavirus 2 by RT PCR NEGATIVE NEGATIVE Final     Comment: (NOTE) SARS-CoV-2 target nucleic acids are NOT DETECTED.  The SARS-CoV-2 RNA is generally detectable in upper respiratory specimens during the acute phase of infection. The lowest concentration of SARS-CoV-2 viral copies this assay can detect is 138 copies/mL. A negative result does not preclude SARS-Cov-2 infection and should not be used as the sole basis for treatment or other patient management decisions. A negative result may occur with  improper specimen collection/handling, submission of specimen other than nasopharyngeal swab, presence of viral mutation(s) within the areas targeted by this assay, and inadequate number of viral copies(<138 copies/mL). A negative result must be combined with clinical observations, patient history, and epidemiological information. The expected result is Negative.  Fact Sheet for Patients:  https://www.fda.gov/media/152166/download  Fact Sheet for Healthcare Providers:  https://www.fda.gov/media/152162/download  This test is no t yet approved or cleared by the [place] FDA and  has been authorized for detection and/or diagnosis of SARS-CoV-2 by FDA under an Emergency Use Authorization (EUA). This EUA will remain  in effect (meaning this test can be used) for the duration of the COVID-19 declaration under Section 564(b)(1) of the Act, 21 U.S.C.section 360bbb-3(b)(1), unless the authorization is terminated  or revoked sooner.       Influenza A by PCR NEGATIVE NEGATIVE Final   Influenza B by PCR NEGATIVE NEGATIVE Final    Comment: (NOTE) The Xpert Xpress SARS-CoV-2/FLU/RSV plus assay is intended as an aid in the diagnosis of influenza from Nasopharyngeal swab specimens and should not be used as a sole basis for treatment. Nasal washings and aspirates are unacceptable for Xpert Xpress SARS-CoV-2/FLU/RSV testing.  Fact Sheet for Patients: https://www.fda.gov/media/152166/download  Fact Sheet for Healthcare  Providers: https://www.fda.gov/media/152162/download  This test is not yet approved or cleared by the [place] FDA and has been authorized for detection and/or diagnosis of SARS-CoV-2 by FDA under an Emergency Use Authorization (EUA). This EUA will remain in effect (meaning this   test can be used) for the duration of the COVID-19 declaration under Section 564(b)(1) of the Act, 21 U.S.C. section 360bbb-3(b)(1), unless the authorization is terminated or revoked.     Resp Syncytial Virus by PCR NEGATIVE NEGATIVE Final    Comment: (NOTE) Fact Sheet for Patients: https://www.fda.gov/media/152166/download  Fact Sheet for Healthcare Providers: https://www.fda.gov/media/152162/download  This test is not yet approved or cleared by the [place] FDA and has been authorized for detection and/or diagnosis of SARS-CoV-2 by FDA under an Emergency Use Authorization (EUA). This EUA will remain in effect (meaning this test can be used) for the duration of the COVID-19 declaration under Section 564(b)(1) of the Act, 21 U.S.C. section 360bbb-3(b)(1), unless the authorization is terminated or revoked.  Performed at [facility], [address], [place], [place] [address]   Body fluid culture w Gram Stain     Status: None (Preliminary result)   Collection Time: 01/20/23  7:03 PM   Specimen: Body Fluid  Result Value Ref Range Status   Specimen Description FLUID  Final   Special Requests KNEE  Final   Gram Stain   Final    ABUNDANT WBC PRESENT, PREDOMINANTLY PMN NO ORGANISMS SEEN Performed at [facility], [address] [place], [place] [address]    Culture PENDING  Incomplete   Report Status PENDING  Incomplete  Culture, blood (Routine X 2) w Reflex to ID Panel     Status: None (Preliminary result)   Collection Time: 01/21/23  5:46 AM   Specimen: BLOOD RIGHT ARM  Result Value Ref Range Status   Specimen Description BLOOD RIGHT ARM  Final   Special  Requests   Final    BOTTLES DRAWN AEROBIC AND ANAEROBIC Blood Culture results may not be optimal due to an excessive volume of blood received in culture bottles   Culture   Final    NO GROWTH < 12 HOURS Performed at [facility], [address] [place], [place] [address]    Report Status PENDING  Incomplete  Culture, blood (Routine X 2) w Reflex to ID Panel     Status: None (Preliminary result)   Collection Time: 01/21/23  5:47 AM   Specimen: BLOOD LEFT HAND  Result Value Ref Range Status   Specimen Description BLOOD LEFT HAND  Final   Special Requests   Final    BOTTLES DRAWN AEROBIC AND ANAEROBIC Blood Culture adequate volume   Culture   Final    NO GROWTH < 12 HOURS Performed at [facility], [address] [place], [place] [address]    Report Status PENDING  Incomplete    RADIOLOGY STUDIES/RESULTS: DG Knee Complete 4 Views Right  Result Date: 01/20/2023 CLINICAL DATA:  Knee pain.  Question infection.  Replacement 2019 EXAM: RIGHT KNEE - COMPLETE 4 VIEW COMPARISON:  None Available. FINDINGS: Total knee arthroplasty identified. Cemented femoral and tibial component. Patellar button. Trace joint fluid. Osteopenia. No fracture or dislocation. No significant lucency seen along the margins of the prosthesis at this time. If there is further concern of infection, a nuclear medicine white blood cell study could be considered. In addition please correlate with any prior imaging to assess for stability of the hardware in the bony structures. IMPRESSION: Total knee arthroplasty. No obvious hardware failure. Trace joint fluid. Further workup can be performed as clinically appropriate Electronically Signed   By: Ashok  Gupta M.D.   On: 01/20/2023 20:01   ECHOCARDIOGRAM COMPLETE  Result Date: 01/20/2023      ECHOCARDIOGRAM REPORT   Patient Name:   Ashley L Anschutz Date of Exam: 01/20/2023 Medical Rec #:  [id number]        Height:       67.0 in Accession #:    [id number]       Weight:        170.4 lb Date of Birth:  06/24/1961        BSA:          1.889 m Patient Age:    62 years         BP:           108/62 mmHg Patient Gender: M                HR:           71 bpm. Exam Location:  Inpatient Procedure: 2D Echo, 3D Echo, Cardiac Doppler and Color Doppler Indications:    Bacteremia  History:        Patient has prior history of Echocardiogram examinations, most                 recent 12/25/2019. CAD and Previous Myocardial Infarction, Aortic                 Valve Disease, Signs/Symptoms:Shortness of Breath, Bacteremia                 and Dyspnea; Risk Factors:Dyslipidemia and Diabetes. Bicuspid                 aortic valve. Aortic stenosis. Bentall procedure.                 Aortic Valve: 23 mm St. Jude mechanical valve is present in the                 aortic position. Procedure Date: 10/09/2015.  Sonographer:    Tina West RDCS Referring Phys: JENNIFER YATES IMPRESSIONS  1. Right ventricular systolic function is normal. The right ventricular size is normal. There is mildly elevated pulmonary artery systolic pressure.  2. There is a 23 mm St. Jude Mechanical Master series valve present in the aortic position.  3. Left ventricular ejection fraction, by estimation, is 55 to 60%. The left ventricle has normal function. The left ventricle has no regional wall motion abnormalities. There is moderate concentric left ventricular hypertrophy. Left ventricular diastolic parameters are consistent with Grade II diastolic dysfunction (pseudonormalization). Elevated left ventricular end-diastolic pressure.  4. The mitral valve is normal in structure. Mild mitral valve regurgitation. No evidence of mitral stenosis.  5. The inferior vena cava is dilated in size with <50% respiratory variability, suggesting right atrial pressure of 15 mmHg.  6. Aortic root/ascending aorta has been repaired/replaced. FINDINGS  Left Ventricle: Left ventricular ejection fraction, by estimation, is 55 to 60%. The left ventricle has normal  function. The left ventricle has no regional wall motion abnormalities. The left ventricular internal cavity size was normal in size. There is  moderate concentric left ventricular hypertrophy. Left ventricular diastolic parameters are consistent with Grade II diastolic dysfunction (pseudonormalization). Elevated left ventricular end-diastolic pressure. Right Ventricle: The right ventricular size is normal. No increase in right ventricular wall thickness. Right ventricular systolic function is normal. There is mildly elevated pulmonary artery systolic pressure. The tricuspid regurgitant velocity is 2.55  m/s, and with an assumed right atrial pressure of 15 mmHg, the estimated right ventricular systolic pressure is 41.0 mmHg. Left Atrium: Left atrial size was normal in size. Right Atrium:   Right atrial size was normal in size. Pericardium: There is no evidence of pericardial effusion. Mitral Valve: The mitral valve is normal in structure. Mild mitral valve regurgitation. No evidence of mitral valve stenosis. MV peak gradient, 8.6 mmHg. The mean mitral valve gradient is 3.0 mmHg. Tricuspid Valve: The tricuspid valve is normal in structure. Tricuspid valve regurgitation is mild . No evidence of tricuspid stenosis. Aortic Valve: Procedure date 10/09/2015/. The aortic valve has been repaired/replaced. Aortic valve regurgitation is not visualized. No aortic stenosis is present. Aortic valve mean gradient measures 14.3 mmHg. Aortic valve peak gradient measures 27.2 mmHg. Aortic valve area, by VTI measures 1.24 cm. There is a 23 mm St. Jude mechanical valve present in the aortic position. Procedure Date: 10/09/2015. Echo findings are consistent with normal structure and function of the aortic valve prosthesis. Pulmonic Valve: The pulmonic valve was normal in structure. Pulmonic valve regurgitation is not visualized. No evidence of pulmonic stenosis. Aorta: The aortic root/ascending aorta has been repaired/replaced. Venous:  The inferior vena cava is dilated in size with less than 50% respiratory variability, suggesting right atrial pressure of 15 mmHg. IAS/Shunts: No atrial level shunt detected by color flow Doppler.  LEFT VENTRICLE PLAX 2D LVIDd:         4.45 cm      Diastology LVIDs:         2.80 cm      LV e' medial:    7.72 cm/s LV PW:         1.20 cm      LV E/e' medial:  16.2 LV IVS:        1.50 cm      LV e' lateral:   7.07 cm/s LVOT diam:     1.90 cm      LV E/e' lateral: 17.7 LV SV:         69 LV SV Index:   36 LVOT Area:     2.84 cm                              3D Volume EF: LV Volumes (MOD)            3D EF:        57 % LV vol d, MOD A2C: 104.0 ml LV EDV:       121 ml LV vol d, MOD A4C: 113.0 ml LV ESV:       51 ml LV vol s, MOD A2C: 49.1 ml  LV SV:        69 ml LV vol s, MOD A4C: 43.6 ml LV SV MOD A2C:     54.9 ml LV SV MOD A4C:     113.0 ml LV SV MOD BP:      65.1 ml RIGHT VENTRICLE            IVC RV S prime:     8.27 cm/s  IVC diam: 2.10 cm TAPSE (M-mode): 1.6 cm LEFT ATRIUM             Index        RIGHT ATRIUM           Index LA diam:        3.50 cm 1.85 cm/m   RA Area:     17.00 cm LA Vol (A2C):   55.7 ml 29.48 ml/m  RA Volume:   43.40 ml  22.97 ml/m LA Vol (A4C):   37.4 ml   19.80 ml/m LA Biplane Vol: 50.1 ml 26.52 ml/m  AORTIC VALVE AV Area (Vmax):    1.32 cm AV Area (Vmean):   1.26 cm AV Area (VTI):     1.24 cm AV Vmax:           260.67 cm/s AV Vmean:          174.000 cm/s AV VTI:            0.555 m AV Peak Grad:      27.2 mmHg AV Mean Grad:      14.3 mmHg LVOT Vmax:         121.00 cm/s LVOT Vmean:        77.200 cm/s LVOT VTI:          0.243 m LVOT/AV VTI ratio: 0.44  AORTA Ao Root diam: 3.00 cm Ao Asc diam:  3.20 cm MITRAL VALVE                TRICUSPID VALVE MV Area (PHT): 4.31 cm     TR Peak grad:   26.0 mmHg MV Area VTI:   1.94 cm     TR Vmax:        255.00 cm/s MV Peak grad:  8.6 mmHg MV Mean grad:  3.0 mmHg     SHUNTS MV Vmax:       1.47 m/s     Systemic VTI:  0.24 m MV Vmean:      77.3 cm/s     Systemic Diam: 1.90 cm MV Decel Time: 176 msec MV E velocity: 125.00 cm/s MV A velocity: 50.80 cm/s MV E/A ratio:  2.46 Tiffany Heyburn MD Electronically signed by Tiffany Lake Lakengren MD Signature Date/Time: 01/20/2023/3:20:55 PM    Final    CT CHEST ABDOMEN PELVIS W CONTRAST  Result Date: 01/19/2023 CLINICAL DATA:  Sepsis.  Abdominal pain for 2 weeks with fever. EXAM: CT CHEST, ABDOMEN, AND PELVIS WITH CONTRAST TECHNIQUE: Multidetector CT imaging of the chest, abdomen and pelvis was performed following the standard protocol during bolus administration of intravenous contrast. RADIATION DOSE REDUCTION: This exam was performed according to the departmental dose-optimization program which includes automated exposure control, adjustment of the mA and/or kV according to patient size and/or use of iterative reconstruction technique. CONTRAST:  85mL OMNIPAQUE IOHEXOL 300 MG/ML  SOLN COMPARISON:  CTA abdomen and pelvis 01/21/2020. Chest CTA 09/24/2015 FINDINGS: CT CHEST FINDINGS Cardiovascular: Heart is nonenlarged. Trace pericardial fluid. Status post median sternotomy with prosthetic aortic valve. Also graft material along the ascending thoracic aorta. Mediastinum/Nodes: Normal caliber thoracic esophagus. Esophagus is mildly patulous. Preserved thyroid gland. No specific abnormal lymph node enlargement identified in the axillary region, hilum or mediastinum. Lungs/Pleura: There is some linear opacity seen along bases likely scar or atelectasis. No consolidation, pneumothorax or effusion. Mild breathing motion. Musculoskeletal: Scattered degenerative changes of the thoracic spine. CT ABDOMEN PELVIS FINDINGS Hepatobiliary: Stones in the nondilated gallbladder. No enhancing liver mass. Patent portal vein. Pancreas: Unremarkable. No pancreatic ductal dilatation or surrounding inflammatory changes. Spleen: Normal in size without focal abnormality. Adrenals/Urinary Tract: The adrenal glands are preserved. No enhancing renal  mass or collecting system dilatation. Nonspecific perinephric stranding. Preserved contours of the urinary bladder but the bladder wall slightly thickened. Please correlate with any symptoms. Stomach/Bowel: On this non oral contrast exam, the large bowel has a normal course and caliber with scattered stool. Normal appendix extends medial to the cecum in the right lower quadrant. Second portion duodenal diverticula. Third portion duodenal diverticula also seen. Small   bowel overall is nondilated. Vascular/Lymphatic: Aortic atherosclerosis. No enlarged abdominal or pelvic lymph nodes. Retroaortic left renal vein. Reproductive: Prostate is unremarkable. Other: No free air or free fluid. Musculoskeletal: Scattered degenerative changes of the spine and pelvis. There are some areas of canal stenosis along the lower lumbar spine with posterior disc bulging and osteophyte formation. Transitional lumbosacral segment. IMPRESSION: No consolidation, pneumothorax or effusion. 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 No bowel obstruction, free air or free fluid. Normal appendix. Scattered stool. Gallstones in the nondilated gallbladder. Fatty liver infiltration. Slight wall thickening of the urinary bladder, nonspecific. Please correlate with any symptoms of a subtle cystitis. Electronically Signed   By: Ashok  Gupta M.D.   On: 01/19/2023 10:18   CT Head Wo Contrast  Result Date: 01/19/2023 CLINICAL DATA:  Mental status change for 2 weeks. EXAM: CT HEAD WITHOUT CONTRAST TECHNIQUE: Contiguous axial images were obtained from the base of the skull through the vertex without intravenous contrast. RADIATION DOSE REDUCTION: This exam was performed according to the departmental dose-optimization program which includes automated exposure control, adjustment of the mA and/or kV according to patient size and/or use of iterative reconstruction technique. COMPARISON:  Head CT 09/14/2013 FINDINGS:  Brain: Small low densities in the bilateral cerebellum, convincing even when accounting for motion artifact. No evidence of supratentorial infarct. No hemorrhage, hydrocephalus, collection. Vascular: No hyperdense vessel or unexpected calcification. Skull: Normal. Negative for fracture or focal lesion. Sinuses/Orbits: No acute finding. 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 Motion degraded head CT especially at the ventral brain. Electronically Signed   By: Jonathan  Watts M.D.   On: 01/19/2023 10:08     LOS: 1 day   Ulonda Klosowski, MD  [facility]    To contact the attending provider between 7A-7P or the covering provider during after hours 7P-7A, please log into the web site [email or web address] and access using universal [place] password for that web site. If you do not have the password, please call the hospital operator.  01/21/2023, 9:37 AM    

## 2023-01-21 NOTE — Anesthesia Postprocedure Evaluation (Signed)
Anesthesia Post Note  Patient: Joshaua Yurkovich Weltz  Procedure(s) Performed: TRANSESOPHAGEAL ECHOCARDIOGRAM     Patient location during evaluation: Cath Lab Anesthesia Type: MAC Level of consciousness: awake and alert, patient cooperative and oriented Pain management: pain level controlled Vital Signs Assessment: post-procedure vital signs reviewed and stable Respiratory status: nonlabored ventilation, spontaneous breathing and respiratory function stable Cardiovascular status: stable and blood pressure returned to baseline Postop Assessment: no apparent nausea or vomiting Anesthetic complications: no   No notable events documented.  Last Vitals:  Vitals:   01/21/23 1304 01/21/23 1312  BP: 116/69 111/76  Pulse: 64 63  Resp: 17 18  Temp:    SpO2: 99% 95%    Last Pain:  Vitals:   01/21/23 1312  TempSrc:   PainSc: 0-No pain                 Berlin Viereck,E. Tsion Inghram

## 2023-01-21 NOTE — Transfer of Care (Signed)
Immediate Anesthesia Transfer of Care Note  Patient: Dennis Bates  Procedure(s) Performed: TRANSESOPHAGEAL ECHOCARDIOGRAM  Patient Location: PACU  Anesthesia Type:MAC  Level of Consciousness: drowsy and patient cooperative  Airway & Oxygen Therapy: Patient Spontanous Breathing and Patient connected to nasal cannula oxygen  Post-op Assessment: Report given to RN and Post -op Vital signs reviewed and stable  Post vital signs: Reviewed and stable  Last Vitals:  Vitals Value Taken Time  BP    Temp    Pulse    Resp    SpO2      Last Pain:  Vitals:   01/21/23 1130  TempSrc: Temporal  PainSc:          Complications: No notable events documented.

## 2023-01-21 NOTE — Progress Notes (Signed)
PROGRESS NOTE        PATIENT DETAILS Name: Dennis Bates Age: 62 y.o. Sex: male Date of Birth: 30-Jul-1961 Admit Date: 01/20/2023 Admitting Physician Jonah Blue, MD WUJ:[id number], Elberta Fortis, MD  Brief Summary: Patient is a 62 y.o.  male with history of aortic stenosis-s/p AVR (mechanical valve) in 2016, CAD s/p PCI 2016, HLD, HTN, s/p right total knee arthroplasty 2019 who presented with 2-3 weeks history of subjective fever, abdominal pain, back pain, right knee swelling-she was seen at a local urgent care-and had blood cultures drawn which was subsequently positive, patient was then admitted to the hospitalist service for further evaluation and treatment.  Significant events: 5/8>> eval [facility]-abdominal pain-subjective fever-blood cultures drawn. 5/9>> blood cultures positive for Enterococcus-referred to the ED-admit to [facility].  Significant studies: 5/8>> CT chest/abdomen/pelvis: No consolidation/pneumothorax/effusion-no bowel obstruction.  Gallstones in the nondilated gallbladder 5/8>> CT head: Age-indeterminate bilateral cerebellar infarcts 5/9>> x-ray right knee: Total knee arthroplasty-trace joint fluid 5/9>> echo: EF 55-60%-no obvious vegetation seen on prosthetic aortic valve.  Significant microbiology data: 5/8>> blood culture: Enterococcus faecalis 5/9>> synovial fluid culture: Pending 5/10>> blood culture: Pending  Procedures: 5/9>> arthrocentesis by orthopedics (WBC 54,250 with 76% neutrophils-no crystals)  Consults: ID Orthopedics  Subjective: Lying comfortably in bed-denies any chest pain or shortness of breath.  Objective: Vitals: Blood pressure 118/72, pulse 65, temperature 97.6 F (36.4 C), temperature source Oral, resp. rate 18, height 5\' 7"  (1.702 m), weight 77.3 kg, SpO2 97 %.   Exam: Gen Exam:Alert awake-not in any distress HEENT:atraumatic, normocephalic Chest: B/L clear to auscultation  anteriorly CVS:S1S2 regular Abdomen:soft non tender, non distended Extremities:no edema.  Right knee-mildly swollen-no overlying erythema. 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 Orthopedics following-will likely require washout of the affected knee.  History of mechanical aortic valve replacement Coumadin on hold IV heparin until we sure that patient has completed all procedures/surgeries  History of CAD s/p PCI 2016 No anginal symptoms  DM-2 (A1c 7.0 on 5/6) CBG stable on SSI Resume metformin/Jardiance on discharge.  HTN BP stable on Coreg  HLD Statin/Lopid  Cholelithiasis Asymptomatic at this point-primary problem is enterococcal infection-he will require outpatient elective general surgery evaluation for cholecystectomy.  BMI: Estimated body mass index is 26.69 kg/m as calculated from the following:   Height as of this encounter: 5\' 7"  (1.702 m).   Weight as of this encounter: 77.3 kg.   Code status:   Code Status: Full Code   DVT Prophylaxis:IV heparin   Family Communication: Spouse at bedside   Disposition  Plan: Status is: Inpatient Remains inpatient appropriate because: Severity of illness   Planned Discharge Destination:Home   Diet: Diet Order             Diet NPO time specified  Diet effective  midnight                     Antimicrobial agents: Anti-infectives (From admission, onward)    Start     Dose/Rate Route Frequency Ordered Stop   01/20/23 1100  ampicillin (OMNIPEN) 2 g in sodium chloride 0.9 % 100 mL IVPB        2 g 300 mL/hr over 20 Minutes Intravenous Every 4 hours 01/20/23 0834     01/20/23 1100  cefTRIAXone (ROCEPHIN) 2 g in sodium chloride 0.9 % 100 mL IVPB        2 g 200 mL/hr over 30 Minutes Intravenous Every 12 hours 01/20/23 0834     01/20/23 0415  piperacillin-tazobactam (ZOSYN) IVPB 3.375 g        3.375 g 100 mL/hr over 30 Minutes Intravenous  Once 01/20/23 0403 01/20/23 0500        MEDICATIONS: Scheduled Meds:  carvedilol  3.125 mg Oral BID   gemfibrozil  600 mg Oral BID AC   insulin aspart  0-15 Units Subcutaneous TID WC   insulin aspart  0-5 Units Subcutaneous QHS   ondansetron (ZOFRAN) IV  4 mg Intravenous Once   rosuvastatin  20 mg Oral QHS   Continuous Infusions:  ampicillin (OMNIPEN) IV 2 g (01/21/23 0745)   cefTRIAXone (ROCEPHIN)  IV 2 g (01/20/23 2119)   heparin     lactated ringers 75 mL/hr at 01/21/23 0316   PRN Meds:.acetaminophen **OR** acetaminophen, hydrALAZINE, morphine injection, ondansetron **OR** ondansetron (ZOFRAN) IV, oxyCODONE   I have personally reviewed following labs and imaging studies  LABORATORY DATA: CBC: Recent Labs  Lab 01/17/23 1439 01/19/23 0846 01/20/23 0418 01/21/23 0546  WBC 12.8* 9.4 9.5 6.7  NEUTROABS 10.9*  --  7.5  --   HGB 14.1 13.3 11.9* 11.1*  HCT 41.3 38.9* 34.3* 31.5*  MCV 84.8 83.8 84.1 82.7  PLT 240.0 211 234 246    Basic Metabolic Panel: Recent Labs  Lab 01/17/23 1439 01/19/23 0846 01/20/23 0418 01/21/23 0546  NA 127* 132* 130* 135  K 4.1 4.2 3.7 3.8  CL 91* 97* 96* 101  CO2 23 23 21* 21*  GLUCOSE 97 156* 134* 121*  BUN 22 21 15 13   CREATININE 1.22 0.90 0.87 0.97  CALCIUM 9.8 9.7 9.1 8.8*    GFR: Estimated Creatinine Clearance: 74.8 mL/min (by C-G formula  based on SCr of 0.97 mg/dL).  Liver Function Tests: Recent Labs  Lab 01/17/23 1439 01/19/23 0846 01/20/23 0418  AST 23 28 28   ALT 30 25 26   ALKPHOS 40 38 41  BILITOT 1.2 1.4* 1.1  PROT 7.8 7.8 6.2*  ALBUMIN 4.6 4.3 3.0*   Recent Labs  Lab 01/17/23 1439 01/19/23 0846 01/20/23 0418  LIPASE 72.0* 166* 115*   Recent Labs  Lab 01/19/23 0934  AMMONIA 17    Coagulation Profile: Recent Labs  Lab 01/17/23 1439 01/19/23 0935 01/20/23 0418 01/21/23 0546  INR 4.0* 1.9* 1.8* 2.1*    Cardiac Enzymes: Recent Labs  Lab 01/19/23 0935  CKTOTAL 104    BNP (last 3 results) No results for input(s): "PROBNP" in the last 8760 hours.  Lipid Profile: No results for input(s): "CHOL", "HDL", "LDLCALC", "TRIG", "CHOLHDL", "LDLDIRECT" in the last 72 hours.  Thyroid Function Tests: No results for input(s): "TSH", "T4TOTAL", "FREET4", "T3FREE", "THYROIDAB" in the last 72 hours.  Anemia Panel: No results for input(s): "VITAMINB12", "FOLATE", "FERRITIN", "TIBC", "IRON", "RETICCTPCT" in the last 72 hours.  Urine analysis:    Component Value Date/Time   COLORURINE YELLOW 01/19/2023 1200   APPEARANCEUR CLEAR 01/19/2023 1200   LABSPEC >1.046 (H) 01/19/2023 1200   PHURINE 6.0 01/19/2023 1200   GLUCOSEU >1,000 (A) 01/19/2023 1200   HGBUR SMALL (A) 01/19/2023 1200   BILIRUBINUR NEGATIVE 01/19/2023 1200   KETONESUR 40 (A) 01/19/2023 1200   PROTEINUR TRACE (A) 01/19/2023 1200   NITRITE NEGATIVE 01/19/2023 1200   LEUKOCYTESUR NEGATIVE 01/19/2023 1200    Sepsis Labs: Lactic Acid, Venous    Component Value Date/Time   LATICACIDVEN 1.5 10/20/2015 2200    MICROBIOLOGY: Recent Results (from the past 240 hour(s))  Culture, blood (routine x 2)     Status: Abnormal (Preliminary result)   Collection Time: 01/19/23  9:34 AM   Specimen: BLOOD  Result Value Ref Range Status   Specimen Description   Final    BLOOD LEFT ARM Performed at [facility], [address] [place], [place] [address]    Special Requests   Final    BOTTLES DRAWN AEROBIC AND ANAEROBIC Blood Culture adequate volume Performed at [facility], [address], [place], [place] [address]    Culture  Setup Time   Final    GRAM POSITIVE COCCI IN BOTH AEROBIC AND ANAEROBIC BOTTLES CRITICAL RESULT CALLED TO, READ BACK BY AND VERIFIED WITH: KELLY GIBSON,RN@0039  01/20/23 MK    Culture (A)  Final    ENTEROCOCCUS FAECALIS CULTURE REINCUBATED FOR BETTER GROWTH Performed at [facility], [address], [place], [place] [address]    Report Status PENDING  Incomplete  Blood Culture ID Panel (Reflexed)     Status: Abnormal   Collection Time: 01/19/23  9:34 AM  Result Value Ref Range Status   Enterococcus faecalis DETECTED (A) NOT DETECTED Final    Comment: CRITICAL RESULT CALLED TO, READ BACK BY AND VERIFIED WITH: KELLY GIBSON,RN@0040  01/20/23 MK    Enterococcus Faecium NOT DETECTED NOT DETECTED Final   Listeria monocytogenes NOT DETECTED NOT DETECTED Final   Staphylococcus species NOT DETECTED NOT DETECTED Final   Staphylococcus aureus (BCID) NOT DETECTED NOT DETECTED Final   Staphylococcus epidermidis NOT DETECTED NOT DETECTED Final   Staphylococcus lugdunensis NOT DETECTED NOT DETECTED Final   Streptococcus species NOT DETECTED NOT DETECTED Final   Streptococcus agalactiae NOT DETECTED NOT DETECTED Final   Streptococcus pneumoniae NOT DETECTED NOT DETECTED Final   Streptococcus pyogenes NOT DETECTED NOT DETECTED Final   A.calcoaceticus-baumannii NOT DETECTED NOT DETECTED Final   Bacteroides fragilis NOT DETECTED NOT DETECTED Final   Enterobacterales NOT DETECTED NOT DETECTED Final   Enterobacter cloacae complex NOT DETECTED NOT DETECTED Final   Escherichia coli NOT DETECTED NOT DETECTED Final   Klebsiella aerogenes NOT DETECTED NOT DETECTED Final   Klebsiella oxytoca NOT DETECTED NOT DETECTED Final   Klebsiella pneumoniae NOT DETECTED NOT DETECTED  Final   Proteus species NOT DETECTED NOT DETECTED Final   Salmonella species NOT DETECTED NOT DETECTED Final   Serratia marcescens NOT DETECTED NOT DETECTED Final   Haemophilus influenzae NOT DETECTED NOT DETECTED Final   Neisseria meningitidis NOT DETECTED NOT DETECTED Final   Pseudomonas aeruginosa NOT DETECTED NOT DETECTED Final   Stenotrophomonas maltophilia NOT DETECTED NOT DETECTED Final   Candida albicans NOT DETECTED NOT DETECTED Final  Candida auris NOT DETECTED NOT DETECTED Final   Candida glabrata NOT DETECTED NOT DETECTED Final   Candida krusei NOT DETECTED NOT DETECTED Final   Candida parapsilosis NOT DETECTED NOT DETECTED Final   Candida tropicalis NOT DETECTED NOT DETECTED Final   Cryptococcus neoformans/gattii NOT DETECTED NOT DETECTED Final   Vancomycin resistance NOT DETECTED NOT DETECTED Final    Comment: Performed at [facility], [address], [place], [place] [address]  Culture, blood (routine x 2)     Status: Abnormal (Preliminary result)   Collection Time: 01/19/23 10:15 AM   Specimen: BLOOD  Result Value Ref Range Status   Specimen Description   Final    BLOOD BLOOD RIGHT FOREARM Performed at [facility], [address] [place], [place] [address]    Special Requests   Final    BOTTLES DRAWN AEROBIC AND ANAEROBIC Blood Culture adequate volume Performed at [facility], [address], [place], [place] [address]    Culture  Setup Time   Final    GRAM POSITIVE COCCI IN CHAINS IN BOTH AEROBIC AND ANAEROBIC BOTTLES Performed at [facility], [address] [place], [place] [address]    Culture ENTEROCOCCUS FAECALIS (A)  Final   Report Status PENDING  Incomplete  Resp panel by RT-PCR (RSV, Flu A&B, Covid) Anterior Nasal Swab     Status: None   Collection Time: 01/19/23 12:08 PM   Specimen: Anterior Nasal Swab  Result Value Ref Range Status   SARS Coronavirus 2 by RT PCR NEGATIVE NEGATIVE Final     Comment: (NOTE) SARS-CoV-2 target nucleic acids are NOT DETECTED.  The SARS-CoV-2 RNA is generally detectable in upper respiratory specimens during the acute phase of infection. The lowest concentration of SARS-CoV-2 viral copies this assay can detect is 138 copies/mL. A negative result does not preclude SARS-Cov-2 infection and should not be used as the sole basis for treatment or other patient management decisions. A negative result may occur with  improper specimen collection/handling, submission of specimen other than nasopharyngeal swab, presence of viral mutation(s) within the areas targeted by this assay, and inadequate number of viral copies(<138 copies/mL). A negative result must be combined with clinical observations, patient history, and epidemiological information. The expected result is Negative.  Fact Sheet for Patients:  BloggerCourse.com  Fact Sheet for Healthcare Providers:  SeriousBroker.it  This test is no t yet approved or cleared by the [place] FDA and  has been authorized for detection and/or diagnosis of SARS-CoV-2 by FDA under an Emergency Use Authorization (EUA). This EUA will remain  in effect (meaning this test can be used) for the duration of the COVID-19 declaration under Section 564(b)(1) of the Act, 21 U.S.C.section 360bbb-3(b)(1), unless the authorization is terminated  or revoked sooner.       Influenza A by PCR NEGATIVE NEGATIVE Final   Influenza B by PCR NEGATIVE NEGATIVE Final    Comment: (NOTE) The Xpert Xpress SARS-CoV-2/FLU/RSV plus assay is intended as an aid in the diagnosis of influenza from Nasopharyngeal swab specimens and should not be used as a sole basis for treatment. Nasal washings and aspirates are unacceptable for Xpert Xpress SARS-CoV-2/FLU/RSV testing.  Fact Sheet for Patients: BloggerCourse.com  Fact Sheet for Healthcare  Providers: SeriousBroker.it  This test is not yet approved or cleared by the [place] FDA and has been authorized for detection and/or diagnosis of SARS-CoV-2 by FDA under an Emergency Use Authorization (EUA). This EUA will remain in effect (meaning this  test can be used) for the duration of the COVID-19 declaration under Section 564(b)(1) of the Act, 21 U.S.C. section 360bbb-3(b)(1), unless the authorization is terminated or revoked.     Resp Syncytial Virus by PCR NEGATIVE NEGATIVE Final    Comment: (NOTE) Fact Sheet for Patients: BloggerCourse.com  Fact Sheet for Healthcare Providers: SeriousBroker.it  This test is not yet approved or cleared by the [place] FDA and has been authorized for detection and/or diagnosis of SARS-CoV-2 by FDA under an Emergency Use Authorization (EUA). This EUA will remain in effect (meaning this test can be used) for the duration of the COVID-19 declaration under Section 564(b)(1) of the Act, 21 U.S.C. section 360bbb-3(b)(1), unless the authorization is terminated or revoked.  Performed at [facility], [address] [place], [place] [address]   Body fluid culture w Gram Stain     Status: None (Preliminary result)   Collection Time: 01/20/23  7:03 PM   Specimen: Body Fluid  Result Value Ref Range Status   Specimen Description FLUID  Final   Special Requests KNEE  Final   Gram Stain   Final    ABUNDANT WBC PRESENT, PREDOMINANTLY PMN NO ORGANISMS SEEN Performed at [facility], [address], [place], [place] [address]    Culture PENDING  Incomplete   Report Status PENDING  Incomplete  Culture, blood (Routine X 2) w Reflex to ID Panel     Status: None (Preliminary result)   Collection Time: 01/21/23  5:46 AM   Specimen: BLOOD RIGHT ARM  Result Value Ref Range Status   Specimen Description BLOOD RIGHT ARM  Final   Special  Requests   Final    BOTTLES DRAWN AEROBIC AND ANAEROBIC Blood Culture results may not be optimal due to an excessive volume of blood received in culture bottles   Culture   Final    NO GROWTH < 12 HOURS Performed at [facility], [address] [place], [place] [address]    Report Status PENDING  Incomplete  Culture, blood (Routine X 2) w Reflex to ID Panel     Status: None (Preliminary result)   Collection Time: 01/21/23  5:47 AM   Specimen: BLOOD LEFT HAND  Result Value Ref Range Status   Specimen Description BLOOD LEFT HAND  Final   Special Requests   Final    BOTTLES DRAWN AEROBIC AND ANAEROBIC Blood Culture adequate volume   Culture   Final    NO GROWTH < 12 HOURS Performed at [facility], [address], [place], [place] [address]    Report Status PENDING  Incomplete    RADIOLOGY STUDIES/RESULTS: DG Knee Complete 4 Views Right  Result Date: 01/20/2023 CLINICAL DATA:  Knee pain.  Question infection.  Replacement 2019 EXAM: RIGHT KNEE - COMPLETE 4 VIEW COMPARISON:  None Available. FINDINGS: Total knee arthroplasty identified. Cemented femoral and tibial component. Patellar button. Trace joint fluid. Osteopenia. No fracture or dislocation. No significant lucency seen along the margins of the prosthesis at this time. If there is further concern of infection, a nuclear medicine white blood cell study could be considered. In addition please correlate with any prior imaging to assess for stability of the hardware in the bony structures. IMPRESSION: Total knee arthroplasty. No obvious hardware failure. Trace joint fluid. Further workup can be performed as clinically appropriate Electronically Signed   By: Karen Kays M.D.   On: 01/20/2023 20:01   ECHOCARDIOGRAM COMPLETE  Result Date: 01/20/2023  ECHOCARDIOGRAM REPORT   Patient Name:   KIERIAN HOGGAN Date of Exam: 01/20/2023 Medical Rec #:  [id number]        Height:       67.0 in Accession #:    [id number]       Weight:        170.4 lb Date of Birth:  [birth year]        BSA:          1.889 m Patient Age:    62 years         BP:           108/62 mmHg Patient Gender: M                HR:           71 bpm. Exam Location:  Inpatient Procedure: 2D Echo, 3D Echo, Cardiac Doppler and Color Doppler Indications:    Bacteremia  History:        Patient has prior history of Echocardiogram examinations, most                 recent 12/25/2019. CAD and Previous Myocardial Infarction, Aortic                 Valve Disease, Signs/Symptoms:Shortness of Breath, Bacteremia                 and Dyspnea; Risk Factors:Dyslipidemia and Diabetes. Bicuspid                 aortic valve. Aortic stenosis. Bentall procedure.                 Aortic Valve: 23 mm St. Jude mechanical valve is present in the                 aortic position. Procedure Date: 10/09/2015.  Sonographer:    Sheralyn Boatman RDCS Referring Phys: Jonah Blue IMPRESSIONS  1. Right ventricular systolic function is normal. The right ventricular size is normal. There is mildly elevated pulmonary artery systolic pressure.  2. There is a 23 mm St. Jude Care[employer] information officer series valve present in the aortic position.  3. Left ventricular ejection fraction, by estimation, is 55 to 60%. The left ventricle has normal function. The left ventricle has no regional wall motion abnormalities. There is moderate concentric left ventricular hypertrophy. Left ventricular diastolic parameters are consistent with Grade II diastolic dysfunction (pseudonormalization). Elevated left ventricular end-diastolic pressure.  4. The mitral valve is normal in structure. Mild mitral valve regurgitation. No evidence of mitral stenosis.  5. The inferior vena cava is dilated in size with <50% respiratory variability, suggesting right atrial pressure of 15 mmHg.  6. Aortic root/ascending aorta has been repaired/replaced. FINDINGS  Left Ventricle: Left ventricular ejection fraction, by estimation, is 55 to 60%. The left ventricle has normal  function. The left ventricle has no regional wall motion abnormalities. The left ventricular internal cavity size was normal in size. There is  moderate concentric left ventricular hypertrophy. Left ventricular diastolic parameters are consistent with Grade II diastolic dysfunction (pseudonormalization). Elevated left ventricular end-diastolic pressure. Right Ventricle: The right ventricular size is normal. No increase in right ventricular wall thickness. Right ventricular systolic function is normal. There is mildly elevated pulmonary artery systolic pressure. The tricuspid regurgitant velocity is 2.55  m/s, and with an assumed right atrial pressure of 15 mmHg, the estimated right ventricular systolic pressure is 41.0 mmHg. Left Atrium: Left atrial size was normal in size. Right Atrium:  Right atrial size was normal in size. Pericardium: There is no evidence of pericardial effusion. Mitral Valve: The mitral valve is normal in structure. Mild mitral valve regurgitation. No evidence of mitral valve stenosis. MV peak gradient, 8.6 mmHg. The mean mitral valve gradient is 3.0 mmHg. Tricuspid Valve: The tricuspid valve is normal in structure. Tricuspid valve regurgitation is mild . No evidence of tricuspid stenosis. Aortic Valve: Procedure date 10/09/2015/. The aortic valve has been repaired/replaced. Aortic valve regurgitation is not visualized. No aortic stenosis is present. Aortic valve mean gradient measures 14.3 mmHg. Aortic valve peak gradient measures 27.2 mmHg. Aortic valve area, by VTI measures 1.24 cm. There is a 23 mm St. Jude mechanical valve present in the aortic position. Procedure Date: 10/09/2015. Echo findings are consistent with normal structure and function of the aortic valve prosthesis. Pulmonic Valve: The pulmonic valve was normal in structure. Pulmonic valve regurgitation is not visualized. No evidence of pulmonic stenosis. Aorta: The aortic root/ascending aorta has been repaired/replaced. Venous:  The inferior vena cava is dilated in size with less than 50% respiratory variability, suggesting right atrial pressure of 15 mmHg. IAS/Shunts: No atrial level shunt detected by color flow Doppler.  LEFT VENTRICLE PLAX 2D LVIDd:         4.45 cm      Diastology LVIDs:         2.80 cm      LV e' medial:    7.72 cm/s LV PW:         1.20 cm      LV E/e' medial:  16.2 LV IVS:        1.50 cm      LV e' lateral:   7.07 cm/s LVOT diam:     1.90 cm      LV E/e' lateral: 17.7 LV SV:         69 LV SV Index:   36 LVOT Area:     2.84 cm                              3D Volume EF: LV Volumes (MOD)            3D EF:        57 % LV vol d, MOD A2C: 104.0 ml LV EDV:       121 ml LV vol d, MOD A4C: 113.0 ml LV ESV:       51 ml LV vol s, MOD A2C: 49.1 ml  LV SV:        69 ml LV vol s, MOD A4C: 43.6 ml LV SV MOD A2C:     54.9 ml LV SV MOD A4C:     113.0 ml LV SV MOD BP:      65.1 ml RIGHT VENTRICLE            IVC RV S prime:     8.27 cm/s  IVC diam: 2.10 cm TAPSE (M-mode): 1.6 cm LEFT ATRIUM             Index        RIGHT ATRIUM           Index LA diam:        3.50 cm 1.85 cm/m   RA Area:     17.00 cm LA Vol (A2C):   55.7 ml 29.48 ml/m  RA Volume:   43.40 ml  22.97 ml/m LA Vol (A4C):   37.4 ml  19.80 ml/m LA Biplane Vol: 50.1 ml 26.52 ml/m  AORTIC VALVE AV Area (Vmax):    1.32 cm AV Area (Vmean):   1.26 cm AV Area (VTI):     1.24 cm AV Vmax:           260.67 cm/s AV Vmean:          174.000 cm/s AV VTI:            0.555 m AV Peak Grad:      27.2 mmHg AV Mean Grad:      14.3 mmHg LVOT Vmax:         121.00 cm/s LVOT Vmean:        77.200 cm/s LVOT VTI:          0.243 m LVOT/AV VTI ratio: 0.44  AORTA Ao Root diam: 3.00 cm Ao Asc diam:  3.20 cm MITRAL VALVE                TRICUSPID VALVE MV Area (PHT): 4.31 cm     TR Peak grad:   26.0 mmHg MV Area VTI:   1.94 cm     TR Vmax:        255.00 cm/s MV Peak grad:  8.6 mmHg MV Mean grad:  3.0 mmHg     SHUNTS MV Vmax:       1.47 m/s     Systemic VTI:  0.24 m MV Vmean:      77.3 cm/s     Systemic Diam: 1.90 cm MV Decel Time: 176 msec MV E velocity: 125.00 cm/s MV A velocity: 50.80 cm/s MV E/A ratio:  2.46 Chilton Si MD Electronically signed by Chilton Si MD Signature Date/Time: 01/20/2023/3:20:55 PM    Final    CT CHEST ABDOMEN PELVIS W CONTRAST  Result Date: 01/19/2023 CLINICAL DATA:  Sepsis.  Abdominal pain for 2 weeks with fever. EXAM: CT CHEST, ABDOMEN, AND PELVIS WITH CONTRAST TECHNIQUE: Multidetector CT imaging of the chest, abdomen and pelvis was performed following the standard protocol during bolus administration of intravenous contrast. RADIATION DOSE REDUCTION: This exam was performed according to the departmental dose-optimization program which includes automated exposure control, adjustment of the mA and/or kV according to patient size and/or use of iterative reconstruction technique. CONTRAST:  85mL OMNIPAQUE IOHEXOL 300 MG/ML  SOLN COMPARISON:  CTA abdomen and pelvis 01/21/2020. Chest CTA 09/24/2015 FINDINGS: CT CHEST FINDINGS Cardiovascular: Heart is nonenlarged. Trace pericardial fluid. Status post median sternotomy with prosthetic aortic valve. Also graft material along the ascending thoracic aorta. Mediastinum/Nodes: Normal caliber thoracic esophagus. Esophagus is mildly patulous. Preserved thyroid gland. No specific abnormal lymph node enlargement identified in the axillary region, hilum or mediastinum. Lungs/Pleura: There is some linear opacity seen along bases likely scar or atelectasis. No consolidation, pneumothorax or effusion. Mild breathing motion. Musculoskeletal: Scattered degenerative changes of the thoracic spine. CT ABDOMEN PELVIS FINDINGS Hepatobiliary: Stones in the nondilated gallbladder. No enhancing liver mass. Patent portal vein. Pancreas: Unremarkable. No pancreatic ductal dilatation or surrounding inflammatory changes. Spleen: Normal in size without focal abnormality. Adrenals/Urinary Tract: The adrenal glands are preserved. No enhancing renal  mass or collecting system dilatation. Nonspecific perinephric stranding. Preserved contours of the urinary bladder but the bladder wall slightly thickened. Please correlate with any symptoms. Stomach/Bowel: On this non oral contrast exam, the large bowel has a normal course and caliber with scattered stool. Normal appendix extends medial to the cecum in the right lower quadrant. Second portion duodenal diverticula. Third portion duodenal diverticula also seen. Small  bowel overall is nondilated. Vascular/Lymphatic: Aortic atherosclerosis. No enlarged abdominal or pelvic lymph nodes. Retroaortic left renal vein. Reproductive: Prostate is unremarkable. Other: No free air or free fluid. Musculoskeletal: Scattered degenerative changes of the spine and pelvis. There are some areas of canal stenosis along the lower lumbar spine with posterior disc bulging and osteophyte formation. Transitional lumbosacral segment. IMPRESSION: No consolidation, pneumothorax or effusion. 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 No bowel obstruction, free air or free fluid. Normal appendix. Scattered stool. Gallstones in the nondilated gallbladder. Fatty liver infiltration. Slight wall thickening of the urinary bladder, nonspecific. Please correlate with any symptoms of a subtle cystitis. Electronically Signed   By: Karen Kays M.D.   On: 01/19/2023 10:18   CT Head Wo Contrast  Result Date: 01/19/2023 CLINICAL DATA:  Mental status change for 2 weeks. EXAM: CT HEAD WITHOUT CONTRAST TECHNIQUE: Contiguous axial images were obtained from the base of the skull through the vertex without intravenous contrast. RADIATION DOSE REDUCTION: This exam was performed according to the departmental dose-optimization program which includes automated exposure control, adjustment of the mA and/or kV according to patient size and/or use of iterative reconstruction technique. COMPARISON:  Head CT 09/14/2013 FINDINGS:  Brain: Small low densities in the bilateral cerebellum, convincing even when accounting for motion artifact. No evidence of supratentorial infarct. No hemorrhage, hydrocephalus, collection. Vascular: No hyperdense vessel or unexpected calcification. Skull: Normal. Negative for fracture or focal lesion. Sinuses/Orbits: No acute finding. 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 Motion degraded head CT especially at the ventral brain. Electronically Signed   By: Tiburcio Pea M.D.   On: 01/19/2023 10:08     LOS: 1 day   Jeoffrey Massed, MD  [facility]    To contact the attending provider between 7A-7P or the covering provider during after hours 7P-7A, please log into the web site [email or web address] and access using universal [place] password for that web site. If you do not have the password, please call the hospital operator.  01/21/2023, 9:37 AM

## 2023-01-21 NOTE — Telephone Encounter (Signed)
Can you help the patient cancel these appointments??

## 2023-01-21 NOTE — Progress Notes (Signed)
[facility]  Date of Admission:  01/20/2023     Total days of antibiotics 3         ASSESSMENT:  Mr. Punzalan has right knee aspiration cell counts that are consistent with septic arthritis/prosthetic joint infection with plans for poly exchange scheduled for Monday. TEE planned for today to check for prosthetic valve endocarditis. Continue current dose of ampicillin and ceftriaxone. Hold PICC placement until blood cultures from 01/21/23 are cleared. Plan of care discussed with Mr. Pitsenbarger and wife. Remaining medical and supportive care per Internal Medicine.   PLAN:  Continue current dose of ceftriaxone and ampicillin TEE scheduled today to evaluate for endocarditis. Prosthetic joint infection surgical management per Orthopedics.  Monitor blood cultures for clearance of bacteremia.  Remaining medical and supportive care per Internal Medicine.   Dr. Drue Second is available over the weekend for any ID related questions.   I have personally spent 23 minutes involved in face-to-face and non-face-to-face activities for this patient on the day of the visit. Professional time spent includes the following activities: Preparing to see the patient (review of tests), Obtaining and/or reviewing separately obtained history (admission/discharge record), Performing a medically appropriate examination and/or evaluation , Ordering medications/tests/procedures, referring and communicating with other health care professionals, Documenting clinical information in the EMR, Independently interpreting results (not separately reported), Communicating results to the patient/family/caregiver, Counseling and educating the patient/family/caregiver and Care coordination (not separately reported).    Principal Problem:   Enterococcal bacteremia Active Problems:   Type 2 diabetes mellitus with hyperglycemia (HCC)   Gallstones   Prosthetic valve endocarditis (HCC)   Chronic infection of  prosthetic knee (HCC)   Anticoagulated on warfarin   Essential hypertension   Dyslipidemia    carvedilol  3.125 mg Oral BID   gemfibrozil  600 mg Oral BID AC   insulin aspart  0-15 Units Subcutaneous TID WC   insulin aspart  0-5 Units Subcutaneous QHS   ondansetron (ZOFRAN) IV  4 mg Intravenous Once   rosuvastatin  20 mg Oral QHS    SUBJECTIVE:  Afebrile overnight with no acute events. Right knee aspiration with 54,250 WBC and 76 neutrophils with plan for surgery on Monday. TEE scheduled for today. Has questions about plan of care.   No Known Allergies   Review of Systems: Review of Systems  Constitutional:  Negative for chills, fever and weight loss.  Respiratory:  Negative for cough, shortness of breath and wheezing.   Cardiovascular:  Negative for chest pain and leg swelling.  Gastrointestinal:  Negative for abdominal pain, constipation, diarrhea, nausea and vomiting.  Skin:  Negative for rash.      OBJECTIVE: Vitals:   01/21/23 0135 01/21/23 0200 01/21/23 0400 01/21/23 0738  BP: 116/69   118/72  Pulse: 71 69 68 65  Resp: 18 (!) 24 (!) 21 18  Temp: 98.7 F (37.1 C)   97.6 F (36.4 C)  TempSrc: Oral   Oral  SpO2: 92% 96% 94% 97%  Weight:      Height:       Body mass index is 26.69 kg/m.  Physical Exam Constitutional:      General: He is not in acute distress.    Appearance: He is well-developed.  Cardiovascular:     Rate and Rhythm: Normal rate and regular rhythm.     Heart sounds: Normal heart sounds.  Pulmonary:     Effort: Pulmonary effort is normal.     Breath sounds: Normal breath sounds.  Skin:    General: Skin is warm and dry.  Neurological:     Mental Status: He is alert and oriented to person, place, and time.  Psychiatric:        Behavior: Behavior normal.        Thought Content: Thought content normal.        Judgment: Judgment normal.     Lab Results Lab Results  Component Value Date   WBC 6.7 01/21/2023   HGB 11.1 (L) 01/21/2023    HCT 31.5 (L) 01/21/2023   MCV 82.7 01/21/2023   PLT 246 01/21/2023    Lab Results  Component Value Date   CREATININE 0.97 01/21/2023   BUN 13 01/21/2023   NA 135 01/21/2023   K 3.8 01/21/2023   CL 101 01/21/2023   CO2 21 (L) 01/21/2023    Lab Results  Component Value Date   ALT 26 01/20/2023   AST 28 01/20/2023   ALKPHOS 41 01/20/2023   BILITOT 1.1 01/20/2023     Microbiology: Recent Results (from the past 240 hour(s))  Culture, blood (routine x 2)     Status: Abnormal (Preliminary result)   Collection Time: 01/19/23  9:34 AM   Specimen: BLOOD  Result Value Ref Range Status   Specimen Description   Final    BLOOD LEFT ARM Performed at [facility], [address], [place], [place] [address]    Special Requests   Final    BOTTLES DRAWN AEROBIC AND ANAEROBIC Blood Culture adequate volume Performed at [facility], [address], [place], [place] [address]    Culture  Setup Time   Final    GRAM POSITIVE COCCI IN BOTH AEROBIC AND ANAEROBIC BOTTLES CRITICAL RESULT CALLED TO, READ BACK BY AND VERIFIED WITH: KELLY GIBSON,RN@0039  01/20/23 MK    Culture (A)  Final    ENTEROCOCCUS FAECALIS SUSCEPTIBILITIES TO FOLLOW Performed at [facility], [address], [place], [place] [address]    Report Status PENDING  Incomplete  Blood Culture ID Panel (Reflexed)     Status: Abnormal   Collection Time: 01/19/23  9:34 AM  Result Value Ref Range Status   Enterococcus faecalis DETECTED (A) NOT DETECTED Final    Comment: CRITICAL RESULT CALLED TO, READ BACK BY AND VERIFIED WITH: KELLY GIBSON,RN@0040  01/20/23 MK    Enterococcus Faecium NOT DETECTED NOT DETECTED Final   Listeria monocytogenes NOT DETECTED NOT DETECTED Final   Staphylococcus species NOT DETECTED NOT DETECTED Final   Staphylococcus aureus (BCID) NOT DETECTED NOT DETECTED Final   Staphylococcus epidermidis NOT DETECTED NOT DETECTED Final   Staphylococcus lugdunensis NOT  DETECTED NOT DETECTED Final   Streptococcus species NOT DETECTED NOT DETECTED Final   Streptococcus agalactiae NOT DETECTED NOT DETECTED Final   Streptococcus pneumoniae NOT DETECTED NOT DETECTED Final   Streptococcus pyogenes NOT DETECTED NOT DETECTED Final   A.calcoaceticus-baumannii NOT DETECTED NOT DETECTED Final   Bacteroides fragilis NOT DETECTED NOT DETECTED Final   Enterobacterales NOT DETECTED NOT DETECTED Final   Enterobacter cloacae complex NOT DETECTED NOT DETECTED Final   Escherichia coli NOT DETECTED NOT DETECTED Final   Klebsiella aerogenes NOT DETECTED NOT DETECTED Final   Klebsiella oxytoca NOT DETECTED NOT DETECTED Final   Klebsiella pneumoniae NOT DETECTED NOT DETECTED Final   Proteus species NOT DETECTED NOT DETECTED Final   Salmonella species NOT DETECTED NOT DETECTED Final   Serratia marcescens NOT DETECTED NOT DETECTED Final   Haemophilus influenzae NOT DETECTED NOT DETECTED Final   Neisseria  meningitidis NOT DETECTED NOT DETECTED Final   Pseudomonas aeruginosa NOT DETECTED NOT DETECTED Final   Stenotrophomonas maltophilia NOT DETECTED NOT DETECTED Final   Candida albicans NOT DETECTED NOT DETECTED Final   Candida auris NOT DETECTED NOT DETECTED Final   Candida glabrata NOT DETECTED NOT DETECTED Final   Candida krusei NOT DETECTED NOT DETECTED Final   Candida parapsilosis NOT DETECTED NOT DETECTED Final   Candida tropicalis NOT DETECTED NOT DETECTED Final   Cryptococcus neoformans/gattii NOT DETECTED NOT DETECTED Final   Vancomycin resistance NOT DETECTED NOT DETECTED Final    Comment: Performed at [facility], [address] [place], [place] [address]  Culture, blood (routine x 2)     Status: Abnormal (Preliminary result)   Collection Time: 01/19/23 10:15 AM   Specimen: BLOOD  Result Value Ref Range Status   Specimen Description   Final    BLOOD BLOOD RIGHT FOREARM Performed at [facility], [address], [place], [place]  [address]    Special Requests   Final    BOTTLES DRAWN AEROBIC AND ANAEROBIC Blood Culture adequate volume Performed at [facility], [address] [place], [place] [address]    Culture  Setup Time   Final    GRAM POSITIVE COCCI IN CHAINS IN BOTH AEROBIC AND ANAEROBIC BOTTLES CRITICAL VALUE NOTED.  VALUE IS CONSISTENT WITH PREVIOUSLY REPORTED AND CALLED VALUE.    Culture (A)  Final    ENTEROCOCCUS FAECALIS CULTURE REINCUBATED FOR BETTER GROWTH Performed at [facility], [address], [place], [place] [address]    Report Status PENDING  Incomplete  Resp panel by RT-PCR (RSV, Flu A&B, Covid) Anterior Nasal Swab     Status: None   Collection Time: 01/19/23 12:08 PM   Specimen: Anterior Nasal Swab  Result Value Ref Range Status   SARS Coronavirus 2 by RT PCR NEGATIVE NEGATIVE Final    Comment: (NOTE) SARS-CoV-2 target nucleic acids are NOT DETECTED.  The SARS-CoV-2 RNA is generally detectable in upper respiratory specimens during the acute phase of infection. The lowest concentration of SARS-CoV-2 viral copies this assay can detect is 138 copies/mL. A negative result does not preclude SARS-Cov-2 infection and should not be used as the sole basis for treatment or other patient management decisions. A negative result may occur with  improper specimen collection/handling, submission of specimen other than nasopharyngeal swab, presence of viral mutation(s) within the areas targeted by this assay, and inadequate number of viral copies(<138 copies/mL). A negative result must be combined with clinical observations, patient history, and epidemiological information. The expected result is Negative.  Fact Sheet for Patients:  BloggerCourse.com  Fact Sheet for Healthcare Providers:  SeriousBroker.it  This test is no t yet approved or cleared by the [place] FDA and  has been authorized for detection and/or  diagnosis of SARS-CoV-2 by FDA under an Emergency Use Authorization (EUA). This EUA will remain  in effect (meaning this test can be used) for the duration of the COVID-19 declaration under Section 564(b)(1) of the Act, 21 U.S.C.section 360bbb-3(b)(1), unless the authorization is terminated  or revoked sooner.       Influenza A by PCR NEGATIVE NEGATIVE Final   Influenza B by PCR NEGATIVE NEGATIVE Final    Comment: (NOTE) The Xpert Xpress SARS-CoV-2/FLU/RSV plus assay is intended as an aid in the diagnosis of influenza from Nasopharyngeal swab specimens and should not be used as a sole basis for treatment. Nasal washings and aspirates are unacceptable for  Xpert Xpress SARS-CoV-2/FLU/RSV testing.  Fact Sheet for Patients: BloggerCourse.com  Fact Sheet for Healthcare Providers: SeriousBroker.it  This test is not yet approved or cleared by the [place] FDA and has been authorized for detection and/or diagnosis of SARS-CoV-2 by FDA under an Emergency Use Authorization (EUA). This EUA will remain in effect (meaning this test can be used) for the duration of the COVID-19 declaration under Section 564(b)(1) of the Act, 21 U.S.C. section 360bbb-3(b)(1), unless the authorization is terminated or revoked.     Resp Syncytial Virus by PCR NEGATIVE NEGATIVE Final    Comment: (NOTE) Fact Sheet for Patients: BloggerCourse.com  Fact Sheet for Healthcare Providers: SeriousBroker.it  This test is not yet approved or cleared by the [place] FDA and has been authorized for detection and/or diagnosis of SARS-CoV-2 by FDA under an Emergency Use Authorization (EUA). This EUA will remain in effect (meaning this test can be used) for the duration of the COVID-19 declaration under Section 564(b)(1) of the Act, 21 U.S.C. section 360bbb-3(b)(1), unless the authorization is terminated  or revoked.  Performed at [facility], [address] [place], [place] [address]   Body fluid culture w Gram Stain     Status: None (Preliminary result)   Collection Time: 01/20/23  7:03 PM   Specimen: Body Fluid  Result Value Ref Range Status   Specimen Description FLUID  Final   Special Requests KNEE  Final   Gram Stain   Final    ABUNDANT WBC PRESENT, PREDOMINANTLY PMN NO ORGANISMS SEEN Performed at [facility], [address] [place], [place] [address]    Culture PENDING  Incomplete   Report Status PENDING  Incomplete  Culture, blood (Routine X 2) w Reflex to ID Panel     Status: None (Preliminary result)   Collection Time: 01/21/23  5:46 AM   Specimen: BLOOD RIGHT ARM  Result Value Ref Range Status   Specimen Description BLOOD RIGHT ARM  Final   Special Requests   Final    BOTTLES DRAWN AEROBIC AND ANAEROBIC Blood Culture results may not be optimal due to an excessive volume of blood received in culture bottles   Culture   Final    NO GROWTH < 12 HOURS Performed at [facility], [address], [place], [place] [address]    Report Status PENDING  Incomplete  Culture, blood (Routine X 2) w Reflex to ID Panel     Status: None (Preliminary result)   Collection Time: 01/21/23  5:47 AM   Specimen: BLOOD LEFT HAND  Result Value Ref Range Status   Specimen Description BLOOD LEFT HAND  Final   Special Requests   Final    BOTTLES DRAWN AEROBIC AND ANAEROBIC Blood Culture adequate volume   Culture   Final    NO GROWTH < 12 HOURS Performed at [facility], [address] [place], [place] [address]    Report Status PENDING  Incomplete     Marcos Eke, NP [facility]  01/21/2023  10:50 AM

## 2023-01-21 NOTE — Evaluation (Signed)
Physical Therapy Evaluation Patient Details Name: Dennis Bates MRN: [id number] DOB: 1961-02-01 Today's Date: 01/21/2023  History of Present Illness  Pt is a 62 y.o. male admitted to [facility] 01/20/23 following 2-3 weeks hx R knee swelling and R knee, back, and abdominal pain. Pt presented to urgent care and found to be positive for enterococcal bacteremia. Admitted to [facility] for further evaluation. Planning washout and exchange on 5/13. PMH of aortic stenosis-s/p AVR (mechanical valve) in 2016, CAD s/p PCI 2016, HLD, HTN, s/p right total knee arthroplasty 2019.  Clinical Impression  Pt presents today for R knee pain and swelling. Pt reports his knee feels a lot better today compared to on admission, able to ambulate pain free but antalgic gait noted. Pt performed stair trial requiring minA for safe and controlled management, improving with education on step-to method. Pt is pending OR with ortho on 5/13, educated pt on mobilizing as able and performing strengthening exercises prior to surgery to maintain strength and acute PT will follow up afterwards to continue to progress mobility. At this time recommend OPPT upon discharge but will update next session after the OR. Will follow as appropriate.        Recommendations for follow up therapy are one component of a multi-disciplinary discharge planning process, led by the attending physician.  Recommendations may be updated based on patient status, additional functional criteria and insurance authorization.  Follow Up Recommendations       Assistance Recommended at Discharge Intermittent Supervision/Assistance  Patient can return home with the following  Help with stairs or ramp for entrance    Equipment Recommendations None recommended by PT  Recommendations for Other Services       Functional Status Assessment Patient has had a recent decline in their functional status and demonstrates the ability to make significant improvements in function in a  reasonable and predictable amount of time.     Precautions / Restrictions Precautions Precautions: Fall Restrictions Weight Bearing Restrictions: Yes RLE Weight Bearing: Weight bearing as tolerated      Mobility  Bed Mobility               General bed mobility comments: pt seated EOB upon arrival, ended session with pt EOB    Transfers Overall transfer level: Modified independent Equipment used: None                    Ambulation/Gait Ambulation/Gait assistance: Supervision Gait Distance (Feet): 250 Feet Assistive device: None Gait Pattern/deviations: Step-through pattern, Decreased stride length, Antalgic Gait velocity: grossly WFL     General Gait Details: antlagic gait on RLE but denies any pain with mobility, ambulating well without AD.  Stairs Stairs: Yes Stairs assistance: Min guard, Min assist Stair Management: One rail Left, Step to pattern, Alternating pattern, Forwards, No rails Number of Stairs: 5 (x2 sets) General stair comments: pt attempting to ascend and descend steps with alternating pattern and no rails requiring min A for balance and safety. Instructed pt on use of 1 rail with step-to pattern and proper technique with his R knee, able to complete with minG. Initially attempting to descend step with L foot first but due to knee flexion limitations pt with poor descent onto the step requiring minA, controlled with R foot leading.  Wheelchair Mobility    Modified Rankin (Stroke Patients Only)       Balance Overall balance assessment: Needs assistance Sitting-balance support: No upper extremity supported, Feet supported Sitting balance-Leahy Scale: Normal  Standing balance support: No upper extremity supported, During functional activity Standing balance-Leahy Scale: Good Standing balance comment: no AD use but noted mild imbalance with stairs                             Pertinent Vitals/Pain Pain Assessment Pain  Assessment: 0-10 Pain Score: 0-No pain    Home Living Family/patient expects to be discharged to:: Private residence Living Arrangements: Spouse/significant other Available Help at Discharge: Family Type of Home: House Home Access: Stairs to enter Entrance Stairs-Rails: None Entrance Stairs-Number of Steps: 3 Alternate Level Stairs-Number of Steps: Flight Home Layout: Two level Home Equipment: Shower seat;Hand held Programmer, systems (2 wheels) Additional Comments: clawfoot tub    Prior Function Prior Level of Function : Independent/Modified Independent             Mobility Comments: independent prior to admission ADLs Comments: Preveiously Independent, driving     Hand Dominance   Dominant Hand: Right    Extremity/Trunk Assessment   Upper Extremity Assessment Upper Extremity Assessment: Defer to OT evaluation    Lower Extremity Assessment Lower Extremity Assessment: RLE deficits/detail RLE Deficits / Details: sensation intact, mild swelling noted but pt declining any pain, grossly 4+/5 throughout knee extension/flexion and hip flexion. Knee flexion limited to ~100 degrees AROM    Cervical / Trunk Assessment Cervical / Trunk Assessment: Normal  Communication   Communication: No difficulties  Cognition Arousal/Alertness: Awake/alert Behavior During Therapy: WFL for tasks assessed/performed Overall Cognitive Status: Within Functional Limits for tasks assessed                                 General Comments: A&Ox4, pleasant during session        General Comments General comments (skin integrity, edema, etc.): VSS on room air, no increase in pain with mobility, wife at bedside throughout session. Plans for OR with ortho on 5/13    Exercises     Assessment/Plan    PT Assessment Patient needs continued PT services  PT Problem List Decreased strength;Decreased range of motion;Decreased balance;Decreased mobility;Decreased safety  awareness       PT Treatment Interventions DME instruction;Gait training;Stair training;Functional mobility training;Therapeutic activities;Therapeutic exercise;Neuromuscular re-education;Balance training;Patient/family education;Manual techniques;Modalities    PT Goals (Current goals can be found in the Care Plan section)  Acute Rehab PT Goals Patient Stated Goal: go home PT Goal Formulation: With patient/family Time For Goal Achievement: 02/04/23 Potential to Achieve Goals: Good    Frequency Min 3X/week     Co-evaluation               AM-PAC PT "6 Clicks" Mobility  Outcome Measure Help needed turning from your back to your side while in a flat bed without using bedrails?: None Help needed moving from lying on your back to sitting on the side of a flat bed without using bedrails?: None Help needed moving to and from a bed to a chair (including a wheelchair)?: None Help needed standing up from a chair using your arms (e.g., wheelchair or bedside chair)?: None Help needed to walk in hospital room?: A Little Help needed climbing 3-5 steps with a railing? : A Little 6 Click Score: 22    End of Session Equipment Utilized During Treatment: Gait belt Activity Tolerance: Patient tolerated treatment well Patient left: in bed;with call bell/phone within reach;with nursing/sitter in room;with family/visitor present Nurse Communication:  Mobility status PT Visit Diagnosis: Difficulty in walking, not elsewhere classified (R26.2);Other abnormalities of gait and mobility (R26.89)    Time: 1610-9604 PT Time Calculation (min) (ACUTE ONLY): 16 min   Charges:   PT Evaluation $PT Eval Low Complexity: 1 Low          Lindalou Hose, PT DPT Acute Rehabilitation Services Office [phone]   Leonie Man 01/21/2023, 4:11 PM

## 2023-01-21 NOTE — Anesthesia Procedure Notes (Signed)
Procedure Name: MAC Date/Time: 01/21/2023 12:15 PM  Performed by: Adria Dill, CRNAPre-anesthesia Checklist: Patient identified, Emergency Drugs available, Suction available and Patient being monitored Patient Re-evaluated:Patient Re-evaluated prior to induction Oxygen Delivery Method: Nasal cannula Preoxygenation: Pre-oxygenation with 100% oxygen Induction Type: IV induction Airway Equipment and Method: Bite block Placement Confirmation: positive ETCO2 and breath sounds checked- equal and bilateral Dental Injury: Teeth and Oropharynx as per pre-operative assessment

## 2023-01-22 DIAGNOSIS — T826XXD Infection and inflammatory reaction due to cardiac valve prosthesis, subsequent encounter: Secondary | ICD-10-CM | POA: Diagnosis not present

## 2023-01-22 DIAGNOSIS — T8459XS Infection and inflammatory reaction due to other internal joint prosthesis, sequela: Secondary | ICD-10-CM | POA: Diagnosis not present

## 2023-01-22 DIAGNOSIS — E1165 Type 2 diabetes mellitus with hyperglycemia: Secondary | ICD-10-CM | POA: Diagnosis not present

## 2023-01-22 DIAGNOSIS — R7881 Bacteremia: Secondary | ICD-10-CM | POA: Diagnosis not present

## 2023-01-22 LAB — CULTURE, BLOOD (ROUTINE X 2): Special Requests: ADEQUATE

## 2023-01-22 LAB — CBC
HCT: 33.8 % — ABNORMAL LOW (ref 39.0–52.0)
Hemoglobin: 11.2 g/dL — ABNORMAL LOW (ref 13.0–17.0)
MCH: 28.5 pg (ref 26.0–34.0)
MCHC: 33.1 g/dL (ref 30.0–36.0)
MCV: 86 fL (ref 80.0–100.0)
Platelets: 299 10*3/uL (ref 150–400)
RBC: 3.93 MIL/uL — ABNORMAL LOW (ref 4.22–5.81)
RDW: 13.1 % (ref 11.5–15.5)
WBC: 5.8 10*3/uL (ref 4.0–10.5)
nRBC: 0 % (ref 0.0–0.2)

## 2023-01-22 LAB — PROTIME-INR
INR: 2.2 — ABNORMAL HIGH (ref 0.8–1.2)
Prothrombin Time: 24.9 seconds — ABNORMAL HIGH (ref 11.4–15.2)

## 2023-01-22 LAB — GLUCOSE, CAPILLARY
Glucose-Capillary: 134 mg/dL — ABNORMAL HIGH (ref 70–99)
Glucose-Capillary: 157 mg/dL — ABNORMAL HIGH (ref 70–99)
Glucose-Capillary: 235 mg/dL — ABNORMAL HIGH (ref 70–99)
Glucose-Capillary: 147 mg/dL — ABNORMAL HIGH (ref 70–99)

## 2023-01-22 LAB — HEPARIN LEVEL (UNFRACTIONATED)
Heparin Unfractionated: <0.1 IU/mL — ABNORMAL LOW (ref 0.30–0.70)
Heparin Unfractionated: 0.1 IU/mL — ABNORMAL LOW (ref 0.30–0.70)
Heparin Unfractionated: 0.17 IU/mL — ABNORMAL LOW (ref 0.30–0.70)

## 2023-01-22 NOTE — Progress Notes (Signed)
ANTICOAGULATION CONSULT NOTE  Pharmacy Consult for heparin Indication: mechanical aortic valve  No Known Allergies  Patient Measurements: Height: 5\' 7"  (170.2 cm) Weight: 77.3 kg (170 lb 6.7 oz) IBW/kg (Calculated) : 66.1 Heparin Dosing Weight: 77.3  Vital Signs: Temp: 98.2 F (36.8 C) (05/11 0800) Temp Source: Oral (05/11 1239) BP: 122/81 (05/11 0800) Pulse Rate: 70 (05/11 0800)  Labs: Recent Labs    01/20/23 0418 01/21/23 0546 01/21/23 2003 01/22/23 0629 01/22/23 1451  HGB 11.9* 11.1*  --  11.2*  --   HCT 34.3* 31.5*  --  33.8*  --   PLT 234 246  --  299  --   LABPROT 21.0* 24.2*  --  24.9*  --   INR 1.8* 2.1*  --  2.2*  --   HEPARINUNFRC  --   --  <0.10* <0.10* 0.10*  CREATININE 0.87 0.97  --   --   --      Estimated Creatinine Clearance: 74.8 mL/min (by C-G formula based on SCr of 0.97 mg/dL).  Assessment: 62 yo male with h/o of mechanical aortic valve on warfarin PTA. Admitted with Enterococcus bacteremia. Patient on warfarin 10mg  daily except 15mg  on Mon and Thursdays per HML. Patient's INR was 4 on 5/6 (last dose of warfarin stated on 5/5). INR on admit is 1.8 (goal 2.5-3.5).   5/10 spoke with Sherrie George, RN from [facility] who manages warfarin about higher INR goal 2.5-3.5. It is unknown why goal was increased from 2-3 to 2.5-3.5 in 02/2020. He also had a GIB in 01/2020.  INR is subtherapeutic at 2.2. Hgb down to 11.2, platelets are normal at 299. Heparin level is subtherapeutic at 0.1, on 1600 units/hr. No s/sx of bleeding or infusion issues.    Goal of Therapy:  Heparin level 0.3 - 0.7  Monitor platelets by anticoagulation protocol: Yes   Plan:  Increase heparin drip to 1850 units / hr - check level in 6 hours Continue to hold warfarin Daily heparin level, CBC Daily INR until <2.0  Monitor for s/sx of bleeding Clarify the need for INR goal of 2.5-3.5  Thank you for allowing pharmacy to participate in this patient's care,  Sherron Monday, PharmD, BCCCP Clinical Pharmacist  Phone: [phone] 01/22/2023 3:30 PM  Please check AMION for all [facility] Pharmacy phone numbers After 10:00 PM, call Main Pharmacy [phone]

## 2023-01-22 NOTE — Plan of Care (Signed)

## 2023-01-22 NOTE — Progress Notes (Signed)
ANTICOAGULATION CONSULT NOTE  Pharmacy Consult for heparin Indication: mechanical aortic valve  No Known Allergies  Patient Measurements: Height: 5\' 7"  (170.2 cm) Weight: 77.3 kg (170 lb 6.7 oz) IBW/kg (Calculated) : 66.1 Heparin Dosing Weight: 77.3  Vital Signs: Temp: 98.2 F (36.8 C) (05/11 0417) Temp Source: Oral (05/11 0417) BP: 118/78 (05/11 0417) Pulse Rate: 66 (05/11 0417)  Labs: Recent Labs    01/19/23 0846 01/19/23 0935 01/19/23 1208 01/20/23 0418 01/21/23 0546 01/21/23 2003 01/22/23 0629  HGB 13.3  --   --  11.9* 11.1*  --  11.2*  HCT 38.9*  --   --  34.3* 31.5*  --  33.8*  PLT 211  --   --  234 246  --  299  LABPROT  --  22.0*  --  21.0* 24.2*  --   --   INR  --  1.9*  --  1.8* 2.1*  --   --   HEPARINUNFRC  --   --   --   --   --  <0.10* <0.10*  CREATININE 0.90  --   --  0.87 0.97  --   --   CKTOTAL  --  104  --   --   --   --   --   TROPONINIHS  --  9 12  --   --   --   --      Estimated Creatinine Clearance: 74.8 mL/min (by C-G formula based on SCr of 0.97 mg/dL).  Assessment: 62 yo male with h/o of mechanical aortic valve on warfarin PTA. Admitted with Enterococcus bacteremia. Patient on warfarin 10mg  daily except 15mg  on Mon and Thursdays per HML. Patient's INR was 4 on 5/6 (last dose of warfarin stated on 5/5). INR on admit is 1.8 (goal 2.5-3.5).   INR is subtherapeutic at 2.1 today. Pharmacy consulted to transition to IV heparin. Hgb down to 11.1, platelets are normal. TEE planned for today.   5/10 spoke with Sherrie George, RN from [facility] who manages warfarin about higher INR goal 2.5-3.5. It is unknown why goal was increased from 2-3 to 2.5-3.5 in 02/2020. He also had a GIB in 01/2020.  Heparin level < 0.10 again on 1350 units/hr.  No concerns or issues per RN.   Goal of Therapy:  Heparin level 0.3 - 0.7  Monitor platelets by anticoagulation protocol: Yes   Plan:  Increase heparin drip to 1600 units / hr Daily heparin level,  CBC Daily INR until <2.0  Monitor for s/sx of bleeding Clarify the need for INR goal of 2.5-3.5  Blane Ohara, PharmD  PGY1 Pharmacy Resident

## 2023-01-22 NOTE — Progress Notes (Signed)
PROGRESS NOTE        PATIENT DETAILS Name: Dennis Bates Age: 62 y.o. Sex: male Date of Birth: 08-Jun-1961 Admit Date: 01/20/2023 Admitting Physician Jonah Blue, MD [id number], Elberta Fortis, MD  Brief Summary: Patient is a 62 y.o.  male with history of aortic stenosis-s/p AVR (mechanical valve) in 2016, CAD s/p PCI 2016, HLD, HTN, s/p right total knee arthroplasty 2019 who presented with 2-3 weeks history of subjective fever, abdominal pain, back pain, right knee swelling-she was seen at a local urgent care-and had blood cultures drawn which was subsequently positive, patient was then admitted to the hospitalist service for further evaluation and treatment.  Significant events: 5/8>> eval at [facility]-abdominal pain-subjective fever-blood cultures drawn. 5/9>> blood cultures positive for Enterococcus-referred to the ED-admit to [facility].  Significant studies: 5/8>> CT chest/abdomen/pelvis: No consolidation/pneumothorax/effusion-no bowel obstruction.  Gallstones in the nondilated gallbladder 5/8>> CT head: Age-indeterminate bilateral cerebellar infarcts 5/9>> x-ray right knee: Total knee arthroplasty-trace joint fluid 5/9>> echo: EF 55-60%-no obvious vegetation seen on prosthetic aortic valve. 5/10>> TEE: No vegetation  Significant microbiology data: 5/8>> blood culture: Enterococcus faecalis 5/9>> synovial fluid culture: Enterococcus faecalis 5/10>> blood culture: No growth  Procedures: 5/9>> arthrocentesis by orthopedics (WBC 54,250 with 76% neutrophils-no crystals) 5/10>> TEE: No vegetation  Consults: ID Orthopedics  Subjective: Afebrile-right knee swelling has reoccurred but not much pain.  Feels much better than how he first presented.  Objective: Vitals: Blood pressure 122/81, pulse 70, temperature 98.2 F (36.8 C), temperature source Oral, resp. rate 16, height 5\' 7"  (1.702 m), weight 77.3 kg, SpO2 96 %.   Exam: Gen Exam:Alert  awake-not in any distress HEENT:atraumatic, normocephalic Chest: B/L clear to auscultation anteriorly CVS:S1S2 regular Abdomen:soft non tender, non distended Extremities:no edema Neurology: Non focal-+ right knee swelling-no erythema-nontender. 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   History of mechanical aortic valve replacement Coumadin on hold-allowing INR to slowly drift down. IV heparin until we sure that patient has completed all procedures/surgeries  History of CAD s/p PCI 2016 No anginal symptoms  DM-2 (A1c 7.0 on 5/6) CBG stable on SSI Resume metformin/Jardiance on discharge.  Recent Labs    01/21/23 1547 01/21/23 2112 01/22/23 0828  GLUCAP 173* 177* 134*     HTN BP stable on Coreg  HLD Statin/Lopid  Cholelithiasis Asymptomatic at this point-primary problem is enterococcal infection-he will require outpatient elective general surgery evaluation for cholecystectomy.  BMI: Estimated body mass index is 26.69 kg/m as calculated from the following:   Height as of this  encounter: 5\' 7"  (1.702 m).   Weight as of this encounter: 77.3 kg.   Code status:   Code Status: Full Code   DVT Prophylaxis:IV heparin   Family Communication: None at bedside this morning   Disposition Plan: Status is:  Inpatient Remains inpatient appropriate because: Severity of illness   Planned Discharge Destination:Home with home health-sometime next week.   Diet: Diet Order             Diet regular Room service appropriate? Yes; Fluid consistency: Thin  Diet effective now                     Antimicrobial agents: Anti-infectives (From admission, onward)    Start     Dose/Rate Route Frequency Ordered Stop   01/20/23 1100  ampicillin (OMNIPEN) 2 g in sodium chloride 0.9 % 100 mL IVPB        2 g 300 mL/hr over 20 Minutes Intravenous Every 4 hours 01/20/23 0834     01/20/23 1100  cefTRIAXone (ROCEPHIN) 2 g in sodium chloride 0.9 % 100 mL IVPB        2 g 200 mL/hr over 30 Minutes Intravenous Every 12 hours 01/20/23 0834     01/20/23 0415  piperacillin-tazobactam (ZOSYN) IVPB 3.375 g        3.375 g 100 mL/hr over 30 Minutes Intravenous  Once 01/20/23 0403 01/20/23 0500        MEDICATIONS: Scheduled Meds:  carvedilol  3.125 mg Oral BID   gemfibrozil  600 mg Oral BID AC   insulin aspart  0-15 Units Subcutaneous TID WC   insulin aspart  0-5 Units Subcutaneous QHS   multivitamin with minerals  1 tablet Oral Daily   ondansetron (ZOFRAN) IV  4 mg Intravenous Once   rosuvastatin  20 mg Oral QHS   Continuous Infusions:  ampicillin (OMNIPEN) IV 2 g (01/22/23 0755)   cefTRIAXone (ROCEPHIN)  IV 2 g (01/22/23 0846)   heparin 1,600 Units/hr (01/22/23 0800)   lactated ringers 75 mL/hr at 01/21/23 2139   PRN Meds:.acetaminophen **OR** acetaminophen, hydrALAZINE, morphine injection, ondansetron **OR** ondansetron (ZOFRAN) IV, oxyCODONE   I have personally reviewed following labs and imaging studies  LABORATORY DATA: CBC: Recent Labs  Lab 01/17/23 1439 01/19/23 0846 01/20/23 0418 01/21/23 0546 01/22/23 0629  WBC 12.8* 9.4 9.5 6.7 5.8  NEUTROABS 10.9*  --  7.5  --   --   HGB 14.1 13.3 11.9* 11.1* 11.2*  HCT 41.3 38.9* 34.3* 31.5* 33.8*  MCV 84.8 83.8 84.1 82.7 86.0  PLT 240.0  211 234 246 299     Basic Metabolic Panel: Recent Labs  Lab 01/17/23 1439 01/19/23 0846 01/20/23 0418 01/21/23 0546  NA 127* 132* 130* 135  K 4.1 4.2 3.7 3.8  CL 91* 97* 96* 101  CO2 23 23 21* 21*  GLUCOSE 97 156* 134* 121*  BUN 22 21 15 13   CREATININE 1.22 0.90 0.87 0.97  CALCIUM 9.8 9.7 9.1 8.8*     GFR: Estimated Creatinine Clearance: 74.8 mL/min (by C-G formula based on SCr of 0.97 mg/dL).  Liver Function Tests: Recent Labs  Lab 01/17/23 1439 01/19/23 0846 01/20/23 0418  AST 23 28 28   ALT 30 25 26   ALKPHOS 40 38 41  BILITOT 1.2 1.4* 1.1  PROT 7.8 7.8 6.2*  ALBUMIN 4.6 4.3 3.0*    Recent Labs  Lab 01/17/23 1439 01/19/23 0846 01/20/23 0418  LIPASE 72.0* 166* 115*    Recent Labs  Lab  01/19/23 0934  AMMONIA 17     Coagulation Profile: Recent Labs  Lab 01/17/23 1439 01/19/23 0935 01/20/23 0418 01/21/23 0546 01/22/23 0629  INR 4.0* 1.9* 1.8* 2.1* 2.2*     Cardiac Enzymes: Recent Labs  Lab 01/19/23 0935  CKTOTAL 104     BNP (last 3 results) No results for input(s): "PROBNP" in the last 8760 hours.  Lipid Profile: No results for input(s): "CHOL", "HDL", "LDLCALC", "TRIG", "CHOLHDL", "LDLDIRECT" in the last 72 hours.  Thyroid Function Tests: No results for input(s): "TSH", "T4TOTAL", "FREET4", "T3FREE", "THYROIDAB" in the last 72 hours.  Anemia Panel: No results for input(s): "VITAMINB12", "FOLATE", "FERRITIN", "TIBC", "IRON", "RETICCTPCT" in the last 72 hours.  Urine analysis:    Component Value Date/Time   COLORURINE YELLOW 01/19/2023 1200   APPEARANCEUR CLEAR 01/19/2023 1200   LABSPEC >1.046 (H) 01/19/2023 1200   PHURINE 6.0 01/19/2023 1200   GLUCOSEU >1,000 (A) 01/19/2023 1200   HGBUR SMALL (A) 01/19/2023 1200   BILIRUBINUR NEGATIVE 01/19/2023 1200   KETONESUR 40 (A) 01/19/2023 1200   PROTEINUR TRACE (A) 01/19/2023 1200   NITRITE NEGATIVE 01/19/2023 1200   LEUKOCYTESUR NEGATIVE 01/19/2023 1200    Sepsis Labs: Lactic  Acid, Venous    Component Value Date/Time   LATICACIDVEN 1.5 10/20/2015 2200    MICROBIOLOGY: Recent Results (from the past 240 hour(s))  Culture, blood (routine x 2)     Status: Abnormal   Collection Time: 01/19/23  9:34 AM   Specimen: BLOOD  Result Value Ref Range Status   Specimen Description   Final    BLOOD LEFT ARM Performed at [facility], [address], [place], [place] [address]    Special Requests   Final    BOTTLES DRAWN AEROBIC AND ANAEROBIC Blood Culture adequate volume Performed at [facility], [address] [place], [place] [address]    Culture  Setup Time   Final    GRAM POSITIVE COCCI IN BOTH AEROBIC AND ANAEROBIC BOTTLES CRITICAL RESULT CALLED TO, READ BACK BY AND VERIFIED WITH: KELLY GIBSON,RN@0039  01/20/23 MK Performed at [facility], [address], [place], [place] [address]    Culture ENTEROCOCCUS FAECALIS (A)  Final   Report Status 01/22/2023 FINAL  Final   Organism ID, Bacteria ENTEROCOCCUS FAECALIS  Final      Susceptibility   Enterococcus faecalis - MIC*    AMPICILLIN <=2 SENSITIVE Sensitive     VANCOMYCIN 1 SENSITIVE Sensitive     GENTAMICIN SYNERGY SENSITIVE Sensitive     * ENTEROCOCCUS FAECALIS  Blood Culture ID Panel (Reflexed)     Status: Abnormal   Collection Time: 01/19/23  9:34 AM  Result Value Ref Range Status   Enterococcus faecalis DETECTED (A) NOT DETECTED Final    Comment: CRITICAL RESULT CALLED TO, READ BACK BY AND VERIFIED WITH: KELLY GIBSON,RN@0040  01/20/23 MK    Enterococcus Faecium NOT DETECTED NOT DETECTED Final   Listeria monocytogenes NOT DETECTED NOT DETECTED Final   Staphylococcus species NOT DETECTED NOT DETECTED Final   Staphylococcus aureus (BCID) NOT DETECTED NOT DETECTED Final   Staphylococcus epidermidis NOT DETECTED NOT DETECTED Final   Staphylococcus lugdunensis NOT DETECTED NOT DETECTED Final   Streptococcus species NOT DETECTED NOT DETECTED Final    Streptococcus agalactiae NOT DETECTED NOT DETECTED Final   Streptococcus pneumoniae NOT DETECTED NOT DETECTED Final   Streptococcus pyogenes NOT DETECTED NOT DETECTED Final   A.calcoaceticus-baumannii NOT DETECTED NOT DETECTED Final   Bacteroides fragilis NOT DETECTED NOT DETECTED Final  Enterobacterales NOT DETECTED NOT DETECTED Final   Enterobacter cloacae complex NOT DETECTED NOT DETECTED Final   Escherichia coli NOT DETECTED NOT DETECTED Final   Klebsiella aerogenes NOT DETECTED NOT DETECTED Final   Klebsiella oxytoca NOT DETECTED NOT DETECTED Final   Klebsiella pneumoniae NOT DETECTED NOT DETECTED Final   Proteus species NOT DETECTED NOT DETECTED Final   Salmonella species NOT DETECTED NOT DETECTED Final   Serratia marcescens NOT DETECTED NOT DETECTED Final   Haemophilus influenzae NOT DETECTED NOT DETECTED Final   Neisseria meningitidis NOT DETECTED NOT DETECTED Final   Pseudomonas aeruginosa NOT DETECTED NOT DETECTED Final   Stenotrophomonas maltophilia NOT DETECTED NOT DETECTED Final   Candida albicans NOT DETECTED NOT DETECTED Final   Candida auris NOT DETECTED NOT DETECTED Final   Candida glabrata NOT DETECTED NOT DETECTED Final   Candida krusei NOT DETECTED NOT DETECTED Final   Candida parapsilosis NOT DETECTED NOT DETECTED Final   Candida tropicalis NOT DETECTED NOT DETECTED Final   Cryptococcus neoformans/gattii NOT DETECTED NOT DETECTED Final   Vancomycin resistance NOT DETECTED NOT DETECTED Final    Comment: Performed at [facility], [address], [place], [place] [address]  Culture, blood (routine x 2)     Status: Abnormal   Collection Time: 01/19/23 10:15 AM   Specimen: BLOOD  Result Value Ref Range Status   Specimen Description   Final    BLOOD BLOOD RIGHT FOREARM Performed at [facility], [address] [place], [place] [address]    Special Requests   Final    BOTTLES DRAWN AEROBIC AND ANAEROBIC Blood Culture adequate  volume Performed at [facility], [address], [place], [place] [address]    Culture  Setup Time   Final    GRAM POSITIVE COCCI IN CHAINS IN BOTH AEROBIC AND ANAEROBIC BOTTLES CRITICAL VALUE NOTED.  VALUE IS CONSISTENT WITH PREVIOUSLY REPORTED AND CALLED VALUE.    Culture (A)  Final    ENTEROCOCCUS FAECALIS SUSCEPTIBILITIES PERFORMED ON PREVIOUS CULTURE WITHIN THE LAST 5 DAYS. Performed at [facility], [address], [place], [place] [address]    Report Status 01/22/2023 FINAL  Final  Resp panel by RT-PCR (RSV, Flu A&B, Covid) Anterior Nasal Swab     Status: None   Collection Time: 01/19/23 12:08 PM   Specimen: Anterior Nasal Swab  Result Value Ref Range Status   SARS Coronavirus 2 by RT PCR NEGATIVE NEGATIVE Final    Comment: (NOTE) SARS-CoV-2 target nucleic acids are NOT DETECTED.  The SARS-CoV-2 RNA is generally detectable in upper respiratory specimens during the acute phase of infection. The lowest concentration of SARS-CoV-2 viral copies this assay can detect is 138 copies/mL. A negative result does not preclude SARS-Cov-2 infection and should not be used as the sole basis for treatment or other patient management decisions. A negative result may occur with  improper specimen collection/handling, submission of specimen other than nasopharyngeal swab, presence of viral mutation(s) within the areas targeted by this assay, and inadequate number of viral copies(<138 copies/mL). A negative result must be combined with clinical observations, patient history, and epidemiological information. The expected result is Negative.  Fact Sheet for Patients:  BloggerCourse.com  Fact Sheet for Healthcare Providers:  SeriousBroker.it  This test is no t yet approved or cleared by the [place] FDA and  has been authorized for detection and/or diagnosis of SARS-CoV-2 by FDA under an Emergency Use  Authorization (EUA). This EUA will remain  in effect (meaning  this test can be used) for the duration of the COVID-19 declaration under Section 564(b)(1) of the Act, 21 U.S.C.section 360bbb-3(b)(1), unless the authorization is terminated  or revoked sooner.       Influenza A by PCR NEGATIVE NEGATIVE Final   Influenza B by PCR NEGATIVE NEGATIVE Final    Comment: (NOTE) The Xpert Xpress SARS-CoV-2/FLU/RSV plus assay is intended as an aid in the diagnosis of influenza from Nasopharyngeal swab specimens and should not be used as a sole basis for treatment. Nasal washings and aspirates are unacceptable for Xpert Xpress SARS-CoV-2/FLU/RSV testing.  Fact Sheet for Patients: BloggerCourse.com  Fact Sheet for Healthcare Providers: SeriousBroker.it  This test is not yet approved or cleared by the [place] FDA and has been authorized for detection and/or diagnosis of SARS-CoV-2 by FDA under an Emergency Use Authorization (EUA). This EUA will remain in effect (meaning this test can be used) for the duration of the COVID-19 declaration under Section 564(b)(1) of the Act, 21 U.S.C. section 360bbb-3(b)(1), unless the authorization is terminated or revoked.     Resp Syncytial Virus by PCR NEGATIVE NEGATIVE Final    Comment: (NOTE) Fact Sheet for Patients: BloggerCourse.com  Fact Sheet for Healthcare Providers: SeriousBroker.it  This test is not yet approved or cleared by the [place] FDA and has been authorized for detection and/or diagnosis of SARS-CoV-2 by FDA under an Emergency Use Authorization (EUA). This EUA will remain in effect (meaning this test can be used) for the duration of the COVID-19 declaration under Section 564(b)(1) of the Act, 21 U.S.C. section 360bbb-3(b)(1), unless the authorization is terminated or revoked.  Performed at [facility], [address] [place], [place] [address]   Body fluid culture w Gram Stain     Status: None (Preliminary result)   Collection Time: 01/20/23  7:03 PM   Specimen: Body Fluid  Result Value Ref Range Status   Specimen Description FLUID  Final   Special Requests KNEE  Final   Gram Stain   Final    ABUNDANT WBC PRESENT, PREDOMINANTLY PMN NO ORGANISMS SEEN    Culture   Final    FEW ENTEROCOCCUS FAECALIS SUSCEPTIBILITIES TO FOLLOW Performed at [facility], [address], [place], [place] [address]    Report Status PENDING  Incomplete  Culture, blood (Routine X 2) w Reflex to ID Panel     Status: None (Preliminary result)   Collection Time: 01/21/23  5:46 AM   Specimen: BLOOD RIGHT ARM  Result Value Ref Range Status   Specimen Description BLOOD RIGHT ARM  Final   Special Requests   Final    BOTTLES DRAWN AEROBIC AND ANAEROBIC Blood Culture results may not be optimal due to an excessive volume of blood received in culture bottles   Culture   Final    NO GROWTH < 12 HOURS Performed at [facility], [address], [place], [place] [address]    Report Status PENDING  Incomplete  Culture, blood (Routine X 2) w Reflex to ID Panel     Status: None (Preliminary result)   Collection Time: 01/21/23  5:47 AM   Specimen: BLOOD LEFT HAND  Result Value Ref Range Status   Specimen Description BLOOD LEFT HAND  Final   Special Requests   Final    BOTTLES DRAWN AEROBIC AND ANAEROBIC Blood Culture adequate volume   Culture   Final    NO GROWTH < 12 HOURS Performed at [facility], [address] [place], [place] [address]    Report Status PENDING  Incomplete    RADIOLOGY STUDIES/RESULTS: ECHO TEE  Result Date: 01/21/2023    TRANSESOPHOGEAL ECHO REPORT   Patient Name:   HENSON SWINEFORD Date of Exam: 01/21/2023 Medical Rec #:  [id number]        Height:       67.0 in Accession #:    [id number]       Weight:       170.4 lb Date of Birth:  1960/10/24        BSA:          1.889 m  Patient Age:    62 years         BP:           123/70 mmHg Patient Gender: M                HR:           81 bpm. Exam Location:  Inpatient Procedure: 2D Echo, Cardiac Doppler and Color Doppler Indications:    Bacteremia  History:        Patient has prior history of Echocardiogram examinations.  Sonographer:    Dondra Prader RVT RCS Referring Phys: [id number] Perlie Gold PROCEDURE: After discussion of the risks and benefits of a TEE, an informed consent was obtained from the patient. The transesophogeal probe was passed without difficulty through the esophogus of the patient. Sedation performed by different physician. The patient developed no complications during the procedure.  IMPRESSIONS  1. No obvious vegetations.  2. The left ventricle has normal function.  3. Right ventricular systolic function is normal. The right ventricular size is normal.  4. No left atrial/left atrial appendage thrombus was detected.  5. The mitral valve is normal in structure. Mild mitral valve regurgitation.  6. S/p AVR (23 mm St Jude mechanical prosthesis; procedure 10/09/2015). Some shadowing present from metal. No obvious vegetations seen. . Aortic valve regurgitation is trivial.  7. There is mild (Grade II) plaque. FINDINGS  Left Ventricle: The left ventricle has normal function. The left ventricular internal cavity size was normal in size. Right Ventricle: The right ventricular size is normal. Right ventricular systolic function is normal. Left Atrium: Left atrial size was normal in size. No left atrial/left atrial appendage thrombus was detected. Right Atrium: Right atrial size was normal in size. Pericardium: There is no evidence of pericardial effusion. Mitral Valve: The mitral valve is normal in structure. Mild mitral valve regurgitation. Tricuspid Valve: The tricuspid valve is normal in structure. Tricuspid valve regurgitation is mild. Aortic Valve: S/p AVR (23 mm St Jude mechanical prosthesis; procedure 10/09/2015). Some shadowing  present from metal. No obvious vegetations seen. Aortic valve regurgitation is trivial. Pulmonic Valve: The pulmonic valve was grossly normal. Aorta: There is mild (Grade II) plaque. [name] MD Electronically signed by [name] MD Signature Date/Time: 01/21/2023/5:00:37 PM    Final    EP STUDY  Result Date: 01/21/2023 See surgical note for result.  DG Knee Complete 4 Views Right  Result Date: 01/20/2023 CLINICAL DATA:  Knee pain.  Question infection.  Replacement 2019 EXAM: RIGHT KNEE - COMPLETE 4 VIEW COMPARISON:  None Available. FINDINGS: Total knee arthroplasty identified. Cemented femoral and tibial component. Patellar button. Trace joint fluid. Osteopenia. No fracture or dislocation. No significant lucency seen along the margins of the prosthesis at this time. If there is further concern of infection, a nuclear medicine white blood cell study could be considered. In  addition please correlate with any prior imaging to assess for stability of the hardware in the bony structures. IMPRESSION: Total knee arthroplasty. No obvious hardware failure. Trace joint fluid. Further workup can be performed as clinically appropriate Electronically Signed   By: Karen Kays M.D.   On: 01/20/2023 20:01   ECHOCARDIOGRAM COMPLETE  Result Date: 01/20/2023    ECHOCARDIOGRAM REPORT   Patient Name:   Dennis Bates Date of Exam: 01/20/2023 Medical Rec #:  [id number]        Height:       67.0 in Accession #:    [id number]       Weight:       170.4 lb Date of Birth:  [birth year]        BSA:          1.889 m Patient Age:    62 years         BP:           108/62 mmHg Patient Gender: M                HR:           71 bpm. Exam Location:  Inpatient Procedure: 2D Echo, 3D Echo, Cardiac Doppler and Color Doppler Indications:    Bacteremia  History:        Patient has prior history of Echocardiogram examinations, most                 recent 12/25/2019. CAD and Previous Myocardial Infarction, Aortic                 Valve Disease,  Signs/Symptoms:Shortness of Breath, Bacteremia                 and Dyspnea; Risk Factors:Dyslipidemia and Diabetes. Bicuspid                 aortic valve. Aortic stenosis. Bentall procedure.                 Aortic Valve: 23 mm St. Jude mechanical valve is present in the                 aortic position. Procedure Date: 10/09/2015.  Sonographer:    Sheralyn Boatman RDCS Referring Phys: Jonah Blue IMPRESSIONS  1. Right ventricular systolic function is normal. The right ventricular size is normal. There is mildly elevated pulmonary artery systolic pressure.  2. There is a 23 mm St. Jude Care[employer] information officer series valve present in the aortic position.  3. Left ventricular ejection fraction, by estimation, is 55 to 60%. The left ventricle has normal function. The left ventricle has no regional wall motion abnormalities. There is moderate concentric left ventricular hypertrophy. Left ventricular diastolic parameters are consistent with Grade II diastolic dysfunction (pseudonormalization). Elevated left ventricular end-diastolic pressure.  4. The mitral valve is normal in structure. Mild mitral valve regurgitation. No evidence of mitral stenosis.  5. The inferior vena cava is dilated in size with <50% respiratory variability, suggesting right atrial pressure of 15 mmHg.  6. Aortic root/ascending aorta has been repaired/replaced. FINDINGS  Left Ventricle: Left ventricular ejection fraction, by estimation, is 55 to 60%. The left ventricle has normal function. The left ventricle has no regional wall motion abnormalities. The left ventricular internal cavity size was normal in size. There is  moderate concentric left ventricular hypertrophy. Left ventricular diastolic parameters are consistent with Grade II diastolic dysfunction (pseudonormalization). Elevated left ventricular end-diastolic pressure. Right Ventricle: The right ventricular size  is normal. No increase in right ventricular wall thickness. Right ventricular systolic  function is normal. There is mildly elevated pulmonary artery systolic pressure. The tricuspid regurgitant velocity is 2.55  m/s, and with an assumed right atrial pressure of 15 mmHg, the estimated right ventricular systolic pressure is 41.0 mmHg. Left Atrium: Left atrial size was normal in size. Right Atrium: Right atrial size was normal in size. Pericardium: There is no evidence of pericardial effusion. Mitral Valve: The mitral valve is normal in structure. Mild mitral valve regurgitation. No evidence of mitral valve stenosis. MV peak gradient, 8.6 mmHg. The mean mitral valve gradient is 3.0 mmHg. Tricuspid Valve: The tricuspid valve is normal in structure. Tricuspid valve regurgitation is mild . No evidence of tricuspid stenosis. Aortic Valve: Procedure date 10/09/2015/. The aortic valve has been repaired/replaced. Aortic valve regurgitation is not visualized. No aortic stenosis is present. Aortic valve mean gradient measures 14.3 mmHg. Aortic valve peak gradient measures 27.2 mmHg. Aortic valve area, by VTI measures 1.24 cm. There is a 23 mm St. Jude mechanical valve present in the aortic position. Procedure Date: 10/09/2015. Echo findings are consistent with normal structure and function of the aortic valve prosthesis. Pulmonic Valve: The pulmonic valve was normal in structure. Pulmonic valve regurgitation is not visualized. No evidence of pulmonic stenosis. Aorta: The aortic root/ascending aorta has been repaired/replaced. Venous: The inferior vena cava is dilated in size with less than 50% respiratory variability, suggesting right atrial pressure of 15 mmHg. IAS/Shunts: No atrial level shunt detected by color flow Doppler.  LEFT VENTRICLE PLAX 2D LVIDd:         4.45 cm      Diastology LVIDs:         2.80 cm      LV e' medial:    7.72 cm/s LV PW:         1.20 cm      LV E/e' medial:  16.2 LV IVS:        1.50 cm      LV e' lateral:   7.07 cm/s LVOT diam:     1.90 cm      LV E/e' lateral: 17.7 LV SV:         69  LV SV Index:   36 LVOT Area:     2.84 cm                              3D Volume EF: LV Volumes (MOD)            3D EF:        57 % LV vol d, MOD A2C: 104.0 ml LV EDV:       121 ml LV vol d, MOD A4C: 113.0 ml LV ESV:       51 ml LV vol s, MOD A2C: 49.1 ml  LV SV:        69 ml LV vol s, MOD A4C: 43.6 ml LV SV MOD A2C:     54.9 ml LV SV MOD A4C:     113.0 ml LV SV MOD BP:      65.1 ml RIGHT VENTRICLE            IVC RV S prime:     8.27 cm/s  IVC diam: 2.10 cm TAPSE (M-mode): 1.6 cm LEFT ATRIUM             Index  RIGHT ATRIUM           Index LA diam:        3.50 cm 1.85 cm/m   RA Area:     17.00 cm LA Vol (A2C):   55.7 ml 29.48 ml/m  RA Volume:   43.40 ml  22.97 ml/m LA Vol (A4C):   37.4 ml 19.80 ml/m LA Biplane Vol: 50.1 ml 26.52 ml/m  AORTIC VALVE AV Area (Vmax):    1.32 cm AV Area (Vmean):   1.26 cm AV Area (VTI):     1.24 cm AV Vmax:           260.67 cm/s AV Vmean:          174.000 cm/s AV VTI:            0.555 m AV Peak Grad:      27.2 mmHg AV Mean Grad:      14.3 mmHg LVOT Vmax:         121.00 cm/s LVOT Vmean:        77.200 cm/s LVOT VTI:          0.243 m LVOT/AV VTI ratio: 0.44  AORTA Ao Root diam: 3.00 cm Ao Asc diam:  3.20 cm MITRAL VALVE                TRICUSPID VALVE MV Area (PHT): 4.31 cm     TR Peak grad:   26.0 mmHg MV Area VTI:   1.94 cm     TR Vmax:        255.00 cm/s MV Peak grad:  8.6 mmHg MV Mean grad:  3.0 mmHg     SHUNTS MV Vmax:       1.47 m/s     Systemic VTI:  0.24 m MV Vmean:      77.3 cm/s    Systemic Diam: 1.90 cm MV Decel Time: 176 msec MV E velocity: 125.00 cm/s MV A velocity: 50.80 cm/s MV E/A ratio:  2.46 Chilton Si MD Electronically signed by Chilton Si MD Signature Date/Time: 01/20/2023/3:20:55 PM    Final      LOS: 2 days   Jeoffrey Massed, MD  [facility]    To contact the attending provider between 7A-7P or the covering provider during after hours 7P-7A, please log into the web site [email or web address] and access using universal [address]  password for that web site. If you do not have the password, please call the hospital operator.  01/22/2023, 10:41 AM

## 2023-01-23 ENCOUNTER — Telehealth (HOSPITAL_BASED_OUTPATIENT_CLINIC_OR_DEPARTMENT_OTHER): Payer: Self-pay | Admitting: *Deleted

## 2023-01-23 DIAGNOSIS — R7881 Bacteremia: Secondary | ICD-10-CM | POA: Diagnosis not present

## 2023-01-23 DIAGNOSIS — T8459XS Infection and inflammatory reaction due to other internal joint prosthesis, sequela: Secondary | ICD-10-CM | POA: Diagnosis not present

## 2023-01-23 DIAGNOSIS — T826XXD Infection and inflammatory reaction due to cardiac valve prosthesis, subsequent encounter: Secondary | ICD-10-CM | POA: Diagnosis not present

## 2023-01-23 DIAGNOSIS — I10 Essential (primary) hypertension: Secondary | ICD-10-CM | POA: Diagnosis not present

## 2023-01-23 LAB — CBC
HCT: 33.4 % — ABNORMAL LOW (ref 39.0–52.0)
Hemoglobin: 10.9 g/dL — ABNORMAL LOW (ref 13.0–17.0)
MCH: 27.6 pg (ref 26.0–34.0)
MCHC: 32.6 g/dL (ref 30.0–36.0)
MCV: 84.6 fL (ref 80.0–100.0)
Platelets: 338 10*3/uL (ref 150–400)
RBC: 3.95 MIL/uL — ABNORMAL LOW (ref 4.22–5.81)
RDW: 13.1 % (ref 11.5–15.5)
WBC: 5.9 10*3/uL (ref 4.0–10.5)
nRBC: 0 % (ref 0.0–0.2)

## 2023-01-23 LAB — BODY FLUID CULTURE W GRAM STAIN

## 2023-01-23 LAB — GLUCOSE, CAPILLARY
Glucose-Capillary: 165 mg/dL — ABNORMAL HIGH (ref 70–99)
Glucose-Capillary: 207 mg/dL — ABNORMAL HIGH (ref 70–99)
Glucose-Capillary: 135 mg/dL — ABNORMAL HIGH (ref 70–99)
Glucose-Capillary: 190 mg/dL — ABNORMAL HIGH (ref 70–99)

## 2023-01-23 LAB — BASIC METABOLIC PANEL
Anion gap: 9 (ref 5–15)
BUN: 5 mg/dL — ABNORMAL LOW (ref 8–23)
CO2: 25 mmol/L (ref 22–32)
Calcium: 8.7 mg/dL — ABNORMAL LOW (ref 8.9–10.3)
Chloride: 102 mmol/L (ref 98–111)
Creatinine, Ser: 0.77 mg/dL (ref 0.61–1.24)
GFR, Estimated: >60 mL/min (ref >60)
Glucose, Bld: 154 mg/dL — ABNORMAL HIGH (ref 70–99)
Potassium: 4.1 mmol/L (ref 3.5–5.1)
Sodium: 136 mmol/L (ref 135–145)

## 2023-01-23 LAB — PROTIME-INR
INR: 2.1 — ABNORMAL HIGH (ref 0.8–1.2)
Prothrombin Time: 23.5 seconds — ABNORMAL HIGH (ref 11.4–15.2)

## 2023-01-23 LAB — HEPARIN LEVEL (UNFRACTIONATED): Heparin Unfractionated: 0.37 IU/mL (ref 0.30–0.70)

## 2023-01-23 LAB — CULTURE, BLOOD (ROUTINE X 2)

## 2023-01-23 MED ORDER — POVIDONE-IODINE 10 % EX SWAB
2.0000 | Freq: Once | CUTANEOUS | Status: AC
  Administered 2023-01-24: 2 via TOPICAL

## 2023-01-23 MED ORDER — CHLORHEXIDINE GLUCONATE 4 % EX SOLN
60.0000 mL | Freq: Once | CUTANEOUS | Status: AC
  Administered 2023-01-24: 4 via TOPICAL
  Filled 2023-01-23: qty 60

## 2023-01-23 MED ORDER — CEFAZOLIN SODIUM-DEXTROSE 2-4 GM/100ML-% IV SOLN: 2.0000 g | INTRAVENOUS | Status: DC

## 2023-01-23 NOTE — Progress Notes (Signed)
Mobility Specialist Progress Note    01/23/23 1044  Mobility  Activity Ambulated independently in hallway  Level of Assistance Standby assist, set-up cues, supervision of patient - no hands on  Assistive Device None  Distance Ambulated (ft) 400 ft  RLE Weight Bearing WBAT  Activity Response Tolerated well  Mobility Referral Yes  $Mobility charge 1 Mobility  Mobility Specialist Start Time (ACUTE ONLY) 1031  Mobility Specialist Stop Time (ACUTE ONLY) 1043  Mobility Specialist Time Calculation (min) (ACUTE ONLY) 12 min   Pre-Mobility: 79 HR, 92% SpO2 During Mobility: 89 HR Post-Mobility: 85 HR, 90% SpO2  Pt received in bed and agreeable. No complaints on walk. Returned to chair with call bell in reach.   [place] Mobility Specialist  Please Neurosurgeon or Rehab Office at [phone]

## 2023-01-23 NOTE — Progress Notes (Signed)
PROGRESS NOTE        PATIENT DETAILS Name: Dennis Bates Age: 62 y.o. Sex: male Date of Birth: [birth year] Admit Date: 01/20/2023 Admitting Physician Jonah Blue, MD [id number], Elberta Fortis, MD  Brief Summary: Patient is a 62 y.o.  male with history of aortic stenosis-s/p AVR (mechanical valve) in 2016, CAD s/p PCI 2016, HLD, HTN, s/p right total knee arthroplasty 2019 who presented with 2-3 weeks history of subjective fever, abdominal pain, back pain, right knee swelling-she was seen at a local urgent care-and had blood cultures drawn which was subsequently positive, patient was then admitted to the hospitalist service for further evaluation and treatment.  Significant events: 5/8>> eval [facility]-abdominal pain-subjective fever-blood cultures drawn. 5/9>> blood cultures positive for Enterococcus-referred to the ED-admit to [facility].  Significant studies: 5/8>> CT chest/abdomen/pelvis: No consolidation/pneumothorax/effusion-no bowel obstruction.  Gallstones in the nondilated gallbladder 5/8>> CT head: Age-indeterminate bilateral cerebellar infarcts 5/9>> x-ray right knee: Total knee arthroplasty-trace joint fluid 5/9>> echo: EF 55-60%-no obvious vegetation seen on prosthetic aortic valve. 5/10>> TEE: No vegetation  Significant microbiology data: 5/8>> blood culture: Enterococcus faecalis 5/9>> synovial fluid culture: Enterococcus faecalis 5/10>> blood culture: No growth  Procedures: 5/9>> arthrocentesis by orthopedics (WBC 54,250 with 76% neutrophils-no crystals) 5/10>> TEE: No vegetation  Consults: ID Orthopedics  Subjective: No complaints overnight.  Sitting in bedside chair.  Objective: Vitals: Blood pressure 139/79, pulse 73, temperature 98.9 F (37.2 C), temperature source Oral, resp. rate 14, height 5\' 7"  (1.702 m), weight 77.3 kg, SpO2 97 %.   Exam: Gen Exam:Alert awake-not in any distress HEENT:atraumatic,  normocephalic Chest: B/L clear to auscultation anteriorly CVS:S1S2 regular Abdomen:soft non tender, non distended Extremities:no edema-mild swelling of his right knee without any overlying tenderness/erythema. 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   History of mechanical aortic valve replacement Coumadin on hold-allowing INR to slowly drift down. IV heparin until we sure that patient has completed all procedures/surgeries  History of CAD s/p PCI 2016 No anginal symptoms  DM-2 (A1c 7.0 on 5/6) CBG stable on SSI Resume metformin/Jardiance on discharge.  Recent Labs    01/22/23 1653 01/22/23 2119 01/23/23 0842  GLUCAP 235* 147* 165*      HTN BP stable on Coreg  HLD Statin/Lopid  Cholelithiasis Asymptomatic at this point-primary problem is enterococcal infection-he will require outpatient elective general surgery evaluation for cholecystectomy.  BMI: Estimated body mass index is 26.69 kg/m as calculated from the following:   Height as of this encounter: 5\' 7"  (1.702 m).  Weight as of this encounter: 77.3 kg.   Code status:   Code Status: Full Code   DVT Prophylaxis:IV heparin   Family Communication: None at bedside this morning   Disposition Plan: Status is:  Inpatient Remains inpatient appropriate because: Severity of illness   Planned Discharge Destination:Home with home health-sometime next week.   Diet: Diet Order             Diet NPO time specified  Diet effective midnight           Diet regular Room service appropriate? Yes; Fluid consistency: Thin  Diet effective now                     Antimicrobial agents: Anti-infectives (From admission, onward)    Start     Dose/Rate Route Frequency Ordered Stop   01/20/23 1100  ampicillin (OMNIPEN) 2 g in sodium chloride 0.9 % 100 mL IVPB        2 g 300 mL/hr over 20 Minutes Intravenous Every 4 hours 01/20/23 0834     01/20/23 1100  cefTRIAXone (ROCEPHIN) 2 g in sodium chloride 0.9 % 100 mL IVPB        2 g 200 mL/hr over 30 Minutes Intravenous Every 12 hours 01/20/23 0834     01/20/23 0415  piperacillin-tazobactam (ZOSYN) IVPB 3.375 g        3.375 g 100 mL/hr over 30 Minutes Intravenous  Once 01/20/23 0403 01/20/23 0500        MEDICATIONS: Scheduled Meds:  carvedilol  3.125 mg Oral BID   gemfibrozil  600 mg Oral BID AC   insulin aspart  0-15 Units Subcutaneous TID WC   insulin aspart  0-5 Units Subcutaneous QHS   multivitamin with minerals  1 tablet Oral Daily   ondansetron (ZOFRAN) IV  4 mg Intravenous Once   rosuvastatin  20 mg Oral QHS   Continuous Infusions:  ampicillin (OMNIPEN) IV 2 g (01/23/23 0926)   cefTRIAXone (ROCEPHIN)  IV 2 g (01/23/23 1015)   heparin 2,100 Units/hr (01/23/23 1103)   lactated ringers 75 mL/hr at 01/23/23 0743   PRN Meds:.acetaminophen **OR** acetaminophen, hydrALAZINE, morphine injection, ondansetron **OR** ondansetron (ZOFRAN) IV, oxyCODONE   I have personally reviewed following labs and imaging studies  LABORATORY DATA: CBC: Recent Labs  Lab 01/17/23 1439 01/19/23 0846 01/20/23 0418 01/21/23 0546 01/22/23 0629 01/23/23 0602  WBC 12.8* 9.4 9.5 6.7 5.8 5.9  NEUTROABS 10.9*  --  7.5  --   --   --   HGB 14.1 13.3 11.9* 11.1*  11.2* 10.9*  HCT 41.3 38.9* 34.3* 31.5* 33.8* 33.4*  MCV 84.8 83.8 84.1 82.7 86.0 84.6  PLT 240.0 211 234 246 299 338     Basic Metabolic Panel: Recent Labs  Lab 01/17/23 1439 01/19/23 0846 01/20/23 0418 01/21/23 0546 01/23/23 0602  NA 127* 132* 130* 135 136  K 4.1 4.2 3.7 3.8 4.1  CL 91* 97* 96* 101 102  CO2 23 23 21* 21* 25  GLUCOSE 97 156* 134* 121* 154*  BUN 22 21 15 13  5*  CREATININE 1.22 0.90 0.87 0.97 0.77  CALCIUM 9.8 9.7 9.1 8.8* 8.7*     GFR: Estimated Creatinine Clearance: 90.7 mL/min (by C-G formula based on SCr of 0.77 mg/dL).  Liver Function Tests: Recent Labs  Lab 01/17/23 1439 01/19/23 0846 01/20/23 0418  AST 23 28 28   ALT 30 25 26   ALKPHOS 40 38 41  BILITOT 1.2 1.4* 1.1  PROT 7.8 7.8  6.2*  ALBUMIN 4.6 4.3 3.0*    Recent Labs  Lab 01/17/23 1439 01/19/23 0846 01/20/23 0418  LIPASE 72.0* 166* 115*    Recent Labs  Lab 01/19/23 0934  AMMONIA 17     Coagulation Profile: Recent Labs  Lab 01/19/23 0935 01/20/23 0418 01/21/23 0546 01/22/23 0629 01/23/23 0602  INR 1.9* 1.8* 2.1* 2.2* 2.1*     Cardiac Enzymes: Recent Labs  Lab 01/19/23 0935  CKTOTAL 104     BNP (last 3 results) No results for input(s): "PROBNP" in the last 8760 hours.  Lipid Profile: No results for input(s): "CHOL", "HDL", "LDLCALC", "TRIG", "CHOLHDL", "LDLDIRECT" in the last 72 hours.  Thyroid Function Tests: No results for input(s): "TSH", "T4TOTAL", "FREET4", "T3FREE", "THYROIDAB" in the last 72 hours.  Anemia Panel: No results for input(s): "VITAMINB12", "FOLATE", "FERRITIN", "TIBC", "IRON", "RETICCTPCT" in the last 72 hours.  Urine analysis:    Component Value Date/Time   COLORURINE YELLOW 01/19/2023 1200   APPEARANCEUR CLEAR 01/19/2023 1200   LABSPEC >1.046 (H) 01/19/2023 1200   PHURINE 6.0 01/19/2023 1200   GLUCOSEU >1,000 (A) 01/19/2023 1200   HGBUR SMALL (A) 01/19/2023 1200   BILIRUBINUR NEGATIVE 01/19/2023 1200   KETONESUR 40 (A)  01/19/2023 1200   PROTEINUR TRACE (A) 01/19/2023 1200   NITRITE NEGATIVE 01/19/2023 1200   LEUKOCYTESUR NEGATIVE 01/19/2023 1200    Sepsis Labs: Lactic Acid, Venous    Component Value Date/Time   LATICACIDVEN 1.5 10/20/2015 2200    MICROBIOLOGY: Recent Results (from the past 240 hour(s))  Culture, blood (routine x 2)     Status: Abnormal   Collection Time: 01/19/23  9:34 AM   Specimen: BLOOD  Result Value Ref Range Status   Specimen Description   Final    BLOOD LEFT ARM Performed at [facility], [address] [place], [place] [address]    Special Requests   Final    BOTTLES DRAWN AEROBIC AND ANAEROBIC Blood Culture adequate volume Performed at [facility], [address], [place], [place] [address]    Culture  Setup Time   Final    GRAM POSITIVE COCCI IN BOTH AEROBIC AND ANAEROBIC BOTTLES CRITICAL RESULT CALLED TO, READ BACK BY AND VERIFIED WITH: KELLY GIBSON,RN@0039  01/20/23 MK Performed at [facility], [address] [place], [place] [address]    Culture ENTEROCOCCUS FAECALIS (A)  Final   Report Status 01/22/2023 FINAL  Final   Organism ID, Bacteria ENTEROCOCCUS FAECALIS  Final      Susceptibility   Enterococcus faecalis - MIC*    AMPICILLIN <=2 SENSITIVE Sensitive     VANCOMYCIN 1 SENSITIVE Sensitive     GENTAMICIN SYNERGY SENSITIVE Sensitive     * ENTEROCOCCUS FAECALIS  Blood Culture ID Panel (Reflexed)     Status: Abnormal   Collection Time: 01/19/23  9:34 AM  Result Value Ref Range Status   Enterococcus faecalis DETECTED (A) NOT DETECTED Final    Comment: CRITICAL RESULT CALLED TO, READ BACK BY AND VERIFIED WITH: KELLY GIBSON,RN@0040  01/20/23 MK    Enterococcus Faecium NOT DETECTED NOT DETECTED Final   Listeria monocytogenes NOT DETECTED NOT DETECTED Final   Staphylococcus species NOT DETECTED NOT DETECTED Final   Staphylococcus aureus (BCID) NOT DETECTED NOT DETECTED Final   Staphylococcus epidermidis NOT  DETECTED NOT DETECTED Final   Staphylococcus lugdunensis NOT DETECTED NOT DETECTED Final   Streptococcus species NOT DETECTED NOT DETECTED Final   Streptococcus agalactiae NOT DETECTED NOT DETECTED Final   Streptococcus pneumoniae NOT DETECTED  NOT DETECTED Final   Streptococcus pyogenes NOT DETECTED NOT DETECTED Final   A.calcoaceticus-baumannii NOT DETECTED NOT DETECTED Final   Bacteroides fragilis NOT DETECTED NOT DETECTED Final   Enterobacterales NOT DETECTED NOT DETECTED Final   Enterobacter cloacae complex NOT DETECTED NOT DETECTED Final   Escherichia coli NOT DETECTED NOT DETECTED Final   Klebsiella aerogenes NOT DETECTED NOT DETECTED Final   Klebsiella oxytoca NOT DETECTED NOT DETECTED Final   Klebsiella pneumoniae NOT DETECTED NOT DETECTED Final   Proteus species NOT DETECTED NOT DETECTED Final   Salmonella species NOT DETECTED NOT DETECTED Final   Serratia marcescens NOT DETECTED NOT DETECTED Final   Haemophilus influenzae NOT DETECTED NOT DETECTED Final   Neisseria meningitidis NOT DETECTED NOT DETECTED Final   Pseudomonas aeruginosa NOT DETECTED NOT DETECTED Final   Stenotrophomonas maltophilia NOT DETECTED NOT DETECTED Final   Candida albicans NOT DETECTED NOT DETECTED Final   Candida auris NOT DETECTED NOT DETECTED Final   Candida glabrata NOT DETECTED NOT DETECTED Final   Candida krusei NOT DETECTED NOT DETECTED Final   Candida parapsilosis NOT DETECTED NOT DETECTED Final   Candida tropicalis NOT DETECTED NOT DETECTED Final   Cryptococcus neoformans/gattii NOT DETECTED NOT DETECTED Final   Vancomycin resistance NOT DETECTED NOT DETECTED Final    Comment: Performed at [facility], [address] [place], [place] [address]  Culture, blood (routine x 2)     Status: Abnormal   Collection Time: 01/19/23 10:15 AM   Specimen: BLOOD  Result Value Ref Range Status   Specimen Description   Final    BLOOD BLOOD RIGHT FOREARM Performed at [facility],  [address] [place], [place] [address]    Special Requests   Final    BOTTLES DRAWN AEROBIC AND ANAEROBIC Blood Culture adequate volume Performed at [facility], [address] [place], [place] [address]    Culture  Setup Time   Final    GRAM POSITIVE COCCI IN CHAINS IN BOTH AEROBIC AND ANAEROBIC BOTTLES CRITICAL VALUE NOTED.  VALUE IS CONSISTENT WITH PREVIOUSLY REPORTED AND CALLED VALUE.    Culture (A)  Final    ENTEROCOCCUS FAECALIS SUSCEPTIBILITIES PERFORMED ON PREVIOUS CULTURE WITHIN THE LAST 5 DAYS. Performed at [facility], [address] [place], [place] [address]    Report Status 01/22/2023 FINAL  Final  Resp panel by RT-PCR (RSV, Flu A&B, Covid) Anterior Nasal Swab     Status: None   Collection Time: 01/19/23 12:08 PM   Specimen: Anterior Nasal Swab  Result Value Ref Range Status   SARS Coronavirus 2 by RT PCR NEGATIVE NEGATIVE Final    Comment: (NOTE) SARS-CoV-2 target nucleic acids are NOT DETECTED.  The SARS-CoV-2 RNA is generally detectable in upper respiratory specimens during the acute phase of infection. The lowest concentration of SARS-CoV-2 viral copies this assay can detect is 138 copies/mL. A negative result does not preclude SARS-Cov-2 infection and should not be used as the sole basis for treatment or other patient management decisions. A negative result may occur with  improper specimen collection/handling, submission of specimen other than nasopharyngeal swab, presence of viral mutation(s) within the areas targeted by this assay, and inadequate number of viral copies(<138 copies/mL). A negative result must be combined with clinical observations, patient history, and epidemiological information. The expected result is Negative.  Fact Sheet for Patients:  BloggerCourse.com  Fact Sheet for Healthcare Providers:  SeriousBroker.it  This test is no t yet approved or  cleared by  the [employer] and  has been authorized for detection and/or diagnosis of SARS-CoV-2 by FDA under an Emergency Use Authorization (EUA). This EUA will remain  in effect (meaning this test can be used) for the duration of the COVID-19 declaration under Section 564(b)(1) of the Act, 21 U.S.C.section 360bbb-3(b)(1), unless the authorization is terminated  or revoked sooner.       Influenza A by PCR NEGATIVE NEGATIVE Final   Influenza B by PCR NEGATIVE NEGATIVE Final    Comment: (NOTE) The Xpert Xpress SARS-CoV-2/FLU/RSV plus assay is intended as an aid in the diagnosis of influenza from Nasopharyngeal swab specimens and should not be used as a sole basis for treatment. Nasal washings and aspirates are unacceptable for Xpert Xpress SARS-CoV-2/FLU/RSV testing.  Fact Sheet for Patients: BloggerCourse.com  Fact Sheet for Healthcare Providers: SeriousBroker.it  This test is not yet approved or cleared by the [place] FDA and has been authorized for detection and/or diagnosis of SARS-CoV-2 by FDA under an Emergency Use Authorization (EUA). This EUA will remain in effect (meaning this test can be used) for the duration of the COVID-19 declaration under Section 564(b)(1) of the Act, 21 U.S.C. section 360bbb-3(b)(1), unless the authorization is terminated or revoked.     Resp Syncytial Virus by PCR NEGATIVE NEGATIVE Final    Comment: (NOTE) Fact Sheet for Patients: BloggerCourse.com  Fact Sheet for Healthcare Providers: SeriousBroker.it  This test is not yet approved or cleared by the [place] FDA and has been authorized for detection and/or diagnosis of SARS-CoV-2 by FDA under an Emergency Use Authorization (EUA). This EUA will remain in effect (meaning this test can be used) for the duration of the COVID-19 declaration under Section 564(b)(1) of the Act, 21  U.S.C. section 360bbb-3(b)(1), unless the authorization is terminated or revoked.  Performed at [facility], [address], [place], [place] [address]   Body fluid culture w Gram Stain     Status: None   Collection Time: 01/20/23  7:03 PM   Specimen: Body Fluid  Result Value Ref Range Status   Specimen Description FLUID  Final   Special Requests KNEE  Final   Gram Stain   Final    ABUNDANT WBC PRESENT, PREDOMINANTLY PMN NO ORGANISMS SEEN    Culture   Final    FEW ENTEROCOCCUS FAECALIS CRITICAL RESULT CALLED TO, READ BACK BY AND VERIFIED WITH: RN Oren Beckmann [phone] FCP Performed at [facility], [address], [place], [place] [address]    Report Status 01/23/2023 FINAL  Final   Organism ID, Bacteria ENTEROCOCCUS FAECALIS  Final      Susceptibility   Enterococcus faecalis - MIC*    AMPICILLIN <=2 SENSITIVE Sensitive     VANCOMYCIN 1 SENSITIVE Sensitive     GENTAMICIN SYNERGY SENSITIVE Sensitive     * FEW ENTEROCOCCUS FAECALIS  Culture, blood (Routine X 2) w Reflex to ID Panel     Status: None (Preliminary result)   Collection Time: 01/21/23  5:46 AM   Specimen: BLOOD RIGHT ARM  Result Value Ref Range Status   Specimen Description BLOOD RIGHT ARM  Final   Special Requests   Final    BOTTLES DRAWN AEROBIC AND ANAEROBIC Blood Culture results may not be optimal due to an excessive volume of blood received in culture bottles   Culture   Final    NO GROWTH 1 DAY Performed at [facility], [address], [place], [place] [address]  Report Status PENDING  Incomplete  Culture, blood (Routine X 2) w Reflex to ID Panel     Status: None (Preliminary result)   Collection Time: 01/21/23  5:47 AM   Specimen: BLOOD LEFT HAND  Result Value Ref Range Status   Specimen Description BLOOD LEFT HAND  Final   Special Requests   Final    BOTTLES DRAWN AEROBIC AND ANAEROBIC Blood Culture adequate volume   Culture   Final    NO GROWTH 1 DAY Performed  at [facility], [address] [place], [place] [address]    Report Status PENDING  Incomplete    RADIOLOGY STUDIES/RESULTS: ECHO TEE  Result Date: 01/21/2023    TRANSESOPHOGEAL ECHO REPORT   Patient Name:   Dennis Bates Date of Exam: 01/21/2023 Medical Rec #:  [id number]        Height:       67.0 in Accession #:    [id number]       Weight:       170.4 lb Date of Birth:  06/06/1961        BSA:          1.889 m Patient Age:    62 years         BP:           123/70 mmHg Patient Gender: M                HR:           81 bpm. Exam Location:  Inpatient Procedure: 2D Echo, Cardiac Doppler and Color Doppler Indications:    Bacteremia  History:        Patient has prior history of Echocardiogram examinations.  Sonographer:    Dondra Prader RVT RCS Referring Phys: [id number] Perlie Gold PROCEDURE: After discussion of the risks and benefits of a TEE, an informed consent was obtained from the patient. The transesophogeal probe was passed without difficulty through the esophogus of the patient. Sedation performed by different physician. The patient developed no complications during the procedure.  IMPRESSIONS  1. No obvious vegetations.  2. The left ventricle has normal function.  3. Right ventricular systolic function is normal. The right ventricular size is normal.  4. No left atrial/left atrial appendage thrombus was detected.  5. The mitral valve is normal in structure. Mild mitral valve regurgitation.  6. S/p AVR (23 mm [employer] Jude mechanical prosthesis; procedure 10/09/2015). Some shadowing present from metal. No obvious vegetations seen. . Aortic valve regurgitation is trivial.  7. There is mild (Grade II) plaque. FINDINGS  Left Ventricle: The left ventricle has normal function. The left ventricular internal cavity size was normal in size. Right Ventricle: The right ventricular size is normal. Right ventricular systolic function is normal. Left Atrium: Left atrial size was normal in size. No left atrial/left  atrial appendage thrombus was detected. Right Atrium: Right atrial size was normal in size. Pericardium: There is no evidence of pericardial effusion. Mitral Valve: The mitral valve is normal in structure. Mild mitral valve regurgitation. Tricuspid Valve: The tricuspid valve is normal in structure. Tricuspid valve regurgitation is mild. Aortic Valve: S/p AVR (23 mm St Jude mechanical prosthesis; procedure 10/09/2015). Some shadowing present from metal. No obvious vegetations seen. Aortic valve regurgitation is trivial. Pulmonic Valve: The pulmonic valve was grossly normal. Aorta: There is mild (Grade II) plaque. [name] MD Electronically signed by [name] MD Signature Date/Time: 01/21/2023/5:00:37 PM    Final    EP STUDY  Result Date: 01/21/2023 See surgical note for result.    LOS: 3 days   Jeoffrey Massed, MD  [facility]    To contact the attending provider between 7A-7P or the covering provider during after hours 7P-7A, please log into the web site [email or web address] and access using universal [place] password for that web site. If you do not have the password, please call the hospital operator.  01/23/2023, 11:21 AM

## 2023-01-23 NOTE — Telephone Encounter (Signed)
Post ED Visit - Positive Culture Follow-up  Culture report reviewed by antimicrobial stewardship pharmacist: [facility] []  Enzo Bi, Pharm.D. []  Celedonio Miyamoto, Pharm.D., BCPS AQ-ID []  Garvin Fila, Pharm.D., BCPS []  Georgina Pillion, Pharm.D., BCPS []  [place], [place].D., BCPS, AAHIVP []  Estella Husk, Pharm.D., BCPS, AAHIVP []  Lysle Pearl, PharmD, BCPS []  Phillips Climes, PharmD, BCPS []  Agapito Games, PharmD, BCPS []  Verlan Friends, PharmD []  Mervyn Gay, PharmD, BCPS [x]  Riccardo Dubin, PharmD  [facility] []  Len Childs, PharmD []  Greer Pickerel, PharmD []  Adalberto Cole, PharmD []  Perlie Gold, Rph []  Lonell Face) Jean Rosenthal, PharmD []  Earl Many, PharmD []  Junita Push, PharmD []  Dorna Leitz, PharmD []  Terrilee Files, PharmD []  Lynann Beaver, PharmD []  Keturah Barre, PharmD []  Loralee Pacas, PharmD []  Bernadene Person, PharmD   Positive blood culture Pt currently admitted and being followed by ID. No action needed  Patsey Berthold 01/23/2023, 1:31 PM

## 2023-01-23 NOTE — Progress Notes (Signed)
I am aware of the results of the fluid culture.  Current plan which is unchanged is to perform a extensive synovectomy and poly swap tomorrow at 3 PM.  The patient is aware of the risks and benefits of the surgery including a potential for recurrence and wishes to proceed.   Heparin will need to be stopped at a time that makes surgery safe for 3pm start. Jodi Geralds

## 2023-01-23 NOTE — Progress Notes (Signed)
ANTICOAGULATION CONSULT NOTE  Pharmacy Consult for heparin Indication: mechanical aortic valve  No Known Allergies  Patient Measurements: Height: 5\' 7"  (170.2 cm) Weight: 77.3 kg (170 lb 6.7 oz) IBW/kg (Calculated) : 66.1 Heparin Dosing Weight: 77.3  Vital Signs: Temp: 98.9 F (37.2 C) (05/12 0843) Temp Source: Oral (05/12 0843) BP: 139/79 (05/12 0843) Pulse Rate: 73 (05/12 0843)  Labs: Recent Labs    01/21/23 0546 01/21/23 2003 01/22/23 0629 01/22/23 1451 01/22/23 2253 01/23/23 0602 01/23/23 0926  HGB 11.1*  --  11.2*  --   --  10.9*  --   HCT 31.5*  --  33.8*  --   --  33.4*  --   PLT 246  --  299  --   --  338  --   LABPROT 24.2*  --  24.9*  --   --  23.5*  --   INR 2.1*  --  2.2*  --   --  2.1*  --   HEPARINUNFRC  --    < > <0.10* 0.10* 0.17*  --  0.37  CREATININE 0.97  --   --   --   --  0.77  --    < > = values in this interval not displayed.     Estimated Creatinine Clearance: 90.7 mL/min (by C-G formula based on SCr of 0.77 mg/dL).  Assessment: 62 yo male with h/o of mechanical aortic valve on warfarin PTA. Admitted with Enterococcus bacteremia. Patient on warfarin 10mg  daily except 15mg  on Mon and Thursdays per HML. Patient's INR was 4 on 5/6 (last dose of warfarin stated on 5/5). INR on admit is 1.8 (goal 2.5-3.5).   5/10 spoke with Sherrie George, RN from [facility] who manages warfarin about higher INR goal 2.5-3.5. It is unknown why goal was increased from 2-3 to 2.5-3.5 in 02/2020. He also had a GIB in 01/2020.  INR at 2.1. Hgb down to 10.9, platelets are normal at 338k. Heparin level is therapeutic at 0.37 on 2100 units/hr. No s/sx of bleeding or infusion issues.    Goal of Therapy:  Heparin level 0.3 - 0.7  Monitor platelets by anticoagulation protocol: Yes   Plan:  Continue heparin drip at 2100 units/hr  Continue to hold warfarin Daily heparin level, CBC Daily INR until <2.0  Monitor for s/sx of bleeding Clarify the need for INR goal  of 2.5-3.5  Blane Ohara, PharmD  PGY1 Pharmacy Resident

## 2023-01-23 NOTE — Progress Notes (Signed)
ANTICOAGULATION CONSULT NOTE - Follow Up Consult  Pharmacy Consult for heparin Indication:  AVR  Labs: Recent Labs    01/20/23 0418 01/21/23 0546 01/21/23 2003 01/22/23 0629 01/22/23 1451 01/22/23 2253  HGB 11.9* 11.1*  --  11.2*  --   --   HCT 34.3* 31.5*  --  33.8*  --   --   PLT 234 246  --  299  --   --   LABPROT 21.0* 24.2*  --  24.9*  --   --   INR 1.8* 2.1*  --  2.2*  --   --   HEPARINUNFRC  --   --    < > <0.10* 0.10* 0.17*  CREATININE 0.87 0.97  --   --   --   --    < > = values in this interval not displayed.    Assessment: 62yo male subtherapeutic on heparin after rate change; no infusion issues or signs of bleeding per RN.  Goal of Therapy:  Heparin level 0.3-0.7 units/ml   Plan:  Increase heparin infusion by 3-4 units/kg/hr to 2100 units/hr. Check level in 6 hours.   Vernard Gambles, PharmD, BCPS 01/23/2023 12:18 AM

## 2023-01-23 NOTE — Plan of Care (Signed)

## 2023-01-24 ENCOUNTER — Encounter (HOSPITAL_COMMUNITY)
Admission: EM | Disposition: A | Discharge status: Home / Self Care | Payer: Self-pay | Source: Home / Self Care | Attending: Internal Medicine

## 2023-01-24 ENCOUNTER — Other Ambulatory Visit: Payer: Self-pay

## 2023-01-24 ENCOUNTER — Inpatient Hospital Stay (HOSPITAL_COMMUNITY): Payer: Managed Care, Other (non HMO) | Admitting: Anesthesiology

## 2023-01-24 ENCOUNTER — Encounter (HOSPITAL_COMMUNITY): Payer: Self-pay | Admitting: Internal Medicine

## 2023-01-24 DIAGNOSIS — I10 Essential (primary) hypertension: Secondary | ICD-10-CM | POA: Diagnosis not present

## 2023-01-24 DIAGNOSIS — T826XXD Infection and inflammatory reaction due to cardiac valve prosthesis, subsequent encounter: Secondary | ICD-10-CM | POA: Diagnosis not present

## 2023-01-24 DIAGNOSIS — B952 Enterococcus as the cause of diseases classified elsewhere: Secondary | ICD-10-CM | POA: Diagnosis not present

## 2023-01-24 DIAGNOSIS — I2510 Atherosclerotic heart disease of native coronary artery without angina pectoris: Secondary | ICD-10-CM

## 2023-01-24 DIAGNOSIS — T8453XA Infection and inflammatory reaction due to internal right knee prosthesis, initial encounter: Secondary | ICD-10-CM

## 2023-01-24 DIAGNOSIS — T8459XS Infection and inflammatory reaction due to other internal joint prosthesis, sequela: Secondary | ICD-10-CM | POA: Diagnosis not present

## 2023-01-24 DIAGNOSIS — Z7984 Long term (current) use of oral hypoglycemic drugs: Secondary | ICD-10-CM

## 2023-01-24 DIAGNOSIS — R591 Generalized enlarged lymph nodes: Secondary | ICD-10-CM

## 2023-01-24 DIAGNOSIS — E119 Type 2 diabetes mellitus without complications: Secondary | ICD-10-CM

## 2023-01-24 DIAGNOSIS — R7881 Bacteremia: Secondary | ICD-10-CM | POA: Diagnosis not present

## 2023-01-24 HISTORY — PX: TOTAL KNEE REVISION: SHX996

## 2023-01-24 HISTORY — PX: IRRIGATION AND DEBRIDEMENT KNEE: SHX5185

## 2023-01-24 LAB — CBC
HCT: 32.9 % — ABNORMAL LOW (ref 39.0–52.0)
Hemoglobin: 10.8 g/dL — ABNORMAL LOW (ref 13.0–17.0)
MCH: 28.5 pg (ref 26.0–34.0)
MCHC: 32.8 g/dL (ref 30.0–36.0)
MCV: 86.8 fL (ref 80.0–100.0)
Platelets: 397 10*3/uL (ref 150–400)
RBC: 3.79 MIL/uL — ABNORMAL LOW (ref 4.22–5.81)
RDW: 13.1 % (ref 11.5–15.5)
WBC: 6.7 10*3/uL (ref 4.0–10.5)
nRBC: 0 % (ref 0.0–0.2)

## 2023-01-24 LAB — MRSA NEXT GEN BY PCR, NASAL: MRSA by PCR Next Gen: NOT DETECTED

## 2023-01-24 LAB — PROTIME-INR
INR: 1.5 — ABNORMAL HIGH (ref 0.8–1.2)
Prothrombin Time: 18.3 seconds — ABNORMAL HIGH (ref 11.4–15.2)

## 2023-01-24 LAB — GLUCOSE, CAPILLARY
Glucose-Capillary: 132 mg/dL — ABNORMAL HIGH (ref 70–99)
Glucose-Capillary: 149 mg/dL — ABNORMAL HIGH (ref 70–99)
Glucose-Capillary: 116 mg/dL — ABNORMAL HIGH (ref 70–99)
Glucose-Capillary: 112 mg/dL — ABNORMAL HIGH (ref 70–99)
Glucose-Capillary: 174 mg/dL — ABNORMAL HIGH (ref 70–99)
Glucose-Capillary: 194 mg/dL — ABNORMAL HIGH (ref 70–99)
Glucose-Capillary: 326 mg/dL — ABNORMAL HIGH (ref 70–99)

## 2023-01-24 LAB — HEPARIN LEVEL (UNFRACTIONATED): Heparin Unfractionated: 0.36 IU/mL (ref 0.30–0.70)

## 2023-01-24 LAB — CULTURE, BLOOD (ROUTINE X 2): Culture: NO GROWTH

## 2023-01-24 SURGERY — TOTAL KNEE REVISION
Anesthesia: General | Site: Knee | Laterality: Right

## 2023-01-24 MED ORDER — MIDAZOLAM HCL 2 MG/2ML IJ SOLN: 0.5000 mg | Freq: Once | INTRAMUSCULAR | Status: DC | PRN

## 2023-01-24 MED ORDER — ORAL CARE MOUTH RINSE: 15.0000 mL | Freq: Once | OROMUCOSAL | Status: AC

## 2023-01-24 MED ORDER — OXYCODONE HCL 5 MG/5ML PO SOLN: 5.0000 mg | Freq: Once | ORAL | Status: DC | PRN

## 2023-01-24 MED ORDER — WARFARIN - PHARMACIST DOSING INPATIENT: Freq: Every day | Status: DC

## 2023-01-24 MED ORDER — PROMETHAZINE HCL 25 MG/ML IJ SOLN: 6.2500 mg | INTRAMUSCULAR | Status: DC | PRN

## 2023-01-24 MED ORDER — ROCURONIUM BROMIDE 10 MG/ML (PF) SYRINGE
PREFILLED_SYRINGE | INTRAVENOUS | Status: DC | PRN
  Administered 2023-01-24: 60 mg via INTRAVENOUS

## 2023-01-24 MED ORDER — TRANEXAMIC ACID 1000 MG/10ML IV SOLN
2000.0000 mg | Freq: Once | INTRAVENOUS | Status: AC
  Administered 2023-01-24: 2000 mg via TOPICAL
  Filled 2023-01-24: qty 20

## 2023-01-24 MED ORDER — LACTATED RINGERS IV SOLN: INTRAVENOUS | Status: DC

## 2023-01-24 MED ORDER — DEXAMETHASONE SODIUM PHOSPHATE 10 MG/ML IJ SOLN
INTRAMUSCULAR | Status: DC | PRN
  Administered 2023-01-24: 4 mg via INTRAVENOUS

## 2023-01-24 MED ORDER — FENTANYL CITRATE (PF) 250 MCG/5ML IJ SOLN
INTRAMUSCULAR | Status: DC | PRN
  Administered 2023-01-24 (×2): 100 ug via INTRAVENOUS
  Administered 2023-01-24: 50 ug via INTRAVENOUS

## 2023-01-24 MED ORDER — SUGAMMADEX SODIUM 200 MG/2ML IV SOLN
INTRAVENOUS | Status: DC | PRN
  Administered 2023-01-24: 160 mg via INTRAVENOUS

## 2023-01-24 MED ORDER — HYDROMORPHONE HCL 1 MG/ML IJ SOLN
INTRAMUSCULAR | Status: DC | PRN
  Administered 2023-01-24: .5 mg via INTRAVENOUS

## 2023-01-24 MED ORDER — ACETAMINOPHEN 10 MG/ML IV SOLN
INTRAVENOUS | Status: DC | PRN
  Administered 2023-01-24: 1000 mg via INTRAVENOUS

## 2023-01-24 MED ORDER — HYDROMORPHONE HCL 1 MG/ML IJ SOLN: 0.2500 mg | INTRAMUSCULAR | Status: DC | PRN

## 2023-01-24 MED ORDER — 0.9 % SODIUM CHLORIDE (POUR BTL) OPTIME
TOPICAL | Status: DC | PRN
  Administered 2023-01-24: 1000 mL

## 2023-01-24 MED ORDER — ONDANSETRON HCL 4 MG/2ML IJ SOLN
INTRAMUSCULAR | Status: AC
  Filled 2023-01-24: qty 2

## 2023-01-24 MED ORDER — LIDOCAINE 2% (20 MG/ML) 5 ML SYRINGE
INTRAMUSCULAR | Status: DC | PRN
  Administered 2023-01-24: 20 mg via INTRAVENOUS

## 2023-01-24 MED ORDER — CEFAZOLIN SODIUM-DEXTROSE 2-4 GM/100ML-% IV SOLN
2.0000 g | INTRAVENOUS | Status: AC
  Administered 2023-01-24: 2 g via INTRAVENOUS
  Filled 2023-01-24: qty 100

## 2023-01-24 MED ORDER — HYDROMORPHONE HCL 1 MG/ML IJ SOLN
INTRAMUSCULAR | Status: AC
  Filled 2023-01-24: qty 0.5

## 2023-01-24 MED ORDER — VANCOMYCIN HCL 1000 MG IV SOLR
INTRAVENOUS | Status: DC | PRN
  Administered 2023-01-24: 1000 mg via TOPICAL

## 2023-01-24 MED ORDER — INSULIN ASPART 100 UNIT/ML IJ SOLN: 0.0000 [IU] | INTRAMUSCULAR | Status: DC | PRN

## 2023-01-24 MED ORDER — OXYCODONE HCL 5 MG PO TABS: 5.0000 mg | ORAL_TABLET | Freq: Once | ORAL | Status: DC | PRN

## 2023-01-24 MED ORDER — VANCOMYCIN HCL 1000 MG IV SOLR
INTRAVENOUS | Status: AC
  Filled 2023-01-24: qty 20

## 2023-01-24 MED ORDER — DEXAMETHASONE SODIUM PHOSPHATE 10 MG/ML IJ SOLN
INTRAMUSCULAR | Status: AC
  Filled 2023-01-24: qty 1

## 2023-01-24 MED ORDER — FENTANYL CITRATE (PF) 250 MCG/5ML IJ SOLN
INTRAMUSCULAR | Status: AC
  Filled 2023-01-24: qty 5

## 2023-01-24 MED ORDER — SODIUM CHLORIDE 0.9 % IR SOLN
Status: DC | PRN
  Administered 2023-01-24: 3000 mL

## 2023-01-24 MED ORDER — KETOROLAC TROMETHAMINE 30 MG/ML IJ SOLN
INTRAMUSCULAR | Status: DC | PRN
  Administered 2023-01-24: 30 mg via INTRAVENOUS

## 2023-01-24 MED ORDER — SODIUM CHLORIDE (PF) 0.9 % IJ SOLN
INTRAMUSCULAR | Status: DC | PRN
  Administered 2023-01-24: 15 mL via INTRAVENOUS

## 2023-01-24 MED ORDER — ONDANSETRON HCL 4 MG/2ML IJ SOLN
INTRAMUSCULAR | Status: DC | PRN
  Administered 2023-01-24: 4 mg via INTRAVENOUS

## 2023-01-24 MED ORDER — WARFARIN SODIUM 10 MG PO TABS
10.0000 mg | ORAL_TABLET | Freq: Once | ORAL | Status: AC
  Administered 2023-01-24: 10 mg via ORAL
  Filled 2023-01-24: qty 1

## 2023-01-24 MED ORDER — BUPIVACAINE-EPINEPHRINE 0.25% -1:200000 IJ SOLN
INTRAMUSCULAR | Status: DC | PRN
  Administered 2023-01-24: 15 mL

## 2023-01-24 MED ORDER — PROPOFOL 10 MG/ML IV BOLUS
INTRAVENOUS | Status: AC
  Filled 2023-01-24: qty 20

## 2023-01-24 MED ORDER — ROCURONIUM BROMIDE 10 MG/ML (PF) SYRINGE
PREFILLED_SYRINGE | INTRAVENOUS | Status: AC
  Filled 2023-01-24: qty 10

## 2023-01-24 MED ORDER — BUPIVACAINE-EPINEPHRINE (PF) 0.25% -1:200000 IJ SOLN
INTRAMUSCULAR | Status: AC
  Filled 2023-01-24: qty 30

## 2023-01-24 MED ORDER — PROPOFOL 10 MG/ML IV BOLUS
INTRAVENOUS | Status: DC | PRN
  Administered 2023-01-24: 120 mg via INTRAVENOUS

## 2023-01-24 MED ORDER — HEPARIN (PORCINE) 25000 UT/250ML-% IV SOLN
2100.0000 [IU]/h | INTRAVENOUS | Status: DC
  Administered 2023-01-25 – 2023-01-27 (×4): 2100 [IU]/h via INTRAVENOUS
  Filled 2023-01-24 (×5): qty 250

## 2023-01-24 MED ORDER — OXYCODONE HCL 5 MG PO TABS
5.0000 mg | ORAL_TABLET | ORAL | Status: DC | PRN
  Administered 2023-01-25 (×2): 5 mg via ORAL
  Administered 2023-01-25 – 2023-01-27 (×6): 10 mg via ORAL
  Administered 2023-01-27: 5 mg via ORAL
  Administered 2023-01-27: 10 mg via ORAL
  Filled 2023-01-24: qty 1
  Filled 2023-01-24 (×2): qty 2
  Filled 2023-01-24: qty 1
  Filled 2023-01-24 (×2): qty 2
  Filled 2023-01-24: qty 1
  Filled 2023-01-24 (×3): qty 2

## 2023-01-24 MED ORDER — CHLORHEXIDINE GLUCONATE 0.12 % MT SOLN
15.0000 mL | Freq: Once | OROMUCOSAL | Status: AC
  Administered 2023-01-24: 15 mL via OROMUCOSAL
  Filled 2023-01-24: qty 15

## 2023-01-24 MED ORDER — MEPERIDINE HCL 25 MG/ML IJ SOLN: 6.2500 mg | INTRAMUSCULAR | Status: DC | PRN

## 2023-01-24 MED ORDER — BUPIVACAINE HCL (PF) 0.25 % IJ SOLN
INTRAMUSCULAR | Status: AC
  Filled 2023-01-24: qty 30

## 2023-01-24 MED ORDER — KETOROLAC TROMETHAMINE 30 MG/ML IJ SOLN
INTRAMUSCULAR | Status: AC
  Filled 2023-01-24: qty 1

## 2023-01-24 SURGICAL SUPPLY — 74 items
APL SKNCLS STERI-STRIP NONHPOA (GAUZE/BANDAGES/DRESSINGS)
ATTUNE PSRP INSR SZ5 8 KNEE (Insert) IMPLANT
BAG COUNTER SPONGE SURGICOUNT (BAG) ×1 IMPLANT
BAG SPNG CNTER NS LX DISP (BAG) ×1
BANDAGE ESMARK 6X9 LF (GAUZE/BANDAGES/DRESSINGS) ×1 IMPLANT
BENZOIN TINCTURE PRP APPL 2/3 (GAUZE/BANDAGES/DRESSINGS) ×1 IMPLANT
BLADE SAGITTAL 25.0X1.19X90 (BLADE) ×1 IMPLANT
BLADE SAW SGTL 13X75X1.27 (BLADE) ×1 IMPLANT
BNDG CMPR 9X6 STRL LF SNTH (GAUZE/BANDAGES/DRESSINGS)
BNDG CMPR MED 10X6 ELC LF (GAUZE/BANDAGES/DRESSINGS) ×1
BNDG ELASTIC 6X10 VLCR STRL LF (GAUZE/BANDAGES/DRESSINGS) IMPLANT
BNDG ESMARK 6X9 LF (GAUZE/BANDAGES/DRESSINGS)
BOWL SMART MIX CTS (DISPOSABLE) ×1 IMPLANT
COVER BACK TABLE 24X17X13 BIG (DRAPES) IMPLANT
COVER SURGICAL LIGHT HANDLE (MISCELLANEOUS) ×1 IMPLANT
CUFF TOURN SGL QUICK 34 (TOURNIQUET CUFF) ×1
CUFF TOURN SGL QUICK 42 (TOURNIQUET CUFF) IMPLANT
CUFF TRNQT CYL 34X4.125X (TOURNIQUET CUFF) IMPLANT
DRAPE HALF SHEET 40X57 (DRAPES) ×1 IMPLANT
DRAPE IMP U-DRAPE 54X76 (DRAPES) ×1 IMPLANT
DRAPE U-SHAPE 47X51 STRL (DRAPES) ×1 IMPLANT
DRSG AQUACEL AG ADV 3.5X10 (GAUZE/BANDAGES/DRESSINGS) IMPLANT
DURAPREP 26ML APPLICATOR (WOUND CARE) ×1 IMPLANT
ELECT REM PT RETURN 9FT ADLT (ELECTROSURGICAL) ×1
ELECTRODE REM PT RTRN 9FT ADLT (ELECTROSURGICAL) ×1 IMPLANT
EVACUATOR 1/8 PVC DRAIN (DRAIN) ×1 IMPLANT
FACESHIELD WRAPAROUND (MASK) IMPLANT
FACESHIELD WRAPAROUND OR TEAM (MASK) ×1 IMPLANT
GAUZE PAD ABD 8X10 STRL (GAUZE/BANDAGES/DRESSINGS) ×1 IMPLANT
GAUZE SPONGE 4X4 12PLY STRL (GAUZE/BANDAGES/DRESSINGS) ×1 IMPLANT
GAUZE XEROFORM 5X9 LF (GAUZE/BANDAGES/DRESSINGS) ×1 IMPLANT
GLOVE BIOGEL PI IND STRL 8 (GLOVE) ×2 IMPLANT
GLOVE ECLIPSE 7.5 STRL STRAW (GLOVE) ×2 IMPLANT
GOWN STRL REUS W/ TWL LRG LVL3 (GOWN DISPOSABLE) ×1 IMPLANT
GOWN STRL REUS W/ TWL XL LVL3 (GOWN DISPOSABLE) ×2 IMPLANT
GOWN STRL REUS W/TWL LRG LVL3 (GOWN DISPOSABLE) ×1
GOWN STRL REUS W/TWL XL LVL3 (GOWN DISPOSABLE) ×2
HANDPIECE INTERPULSE COAX TIP (DISPOSABLE) ×1
HOOD W/PEELAWAY (MISCELLANEOUS) ×3 IMPLANT
IMMOBILIZER KNEE 20 (SOFTGOODS) ×1 IMPLANT
IMMOBILIZER KNEE 20 THIGH 36 (SOFTGOODS) IMPLANT
IMMOBILIZER KNEE 22 UNIV (SOFTGOODS) IMPLANT
IMMOBILIZER KNEE 24 THIGH 36 (MISCELLANEOUS) IMPLANT
IMMOBILIZER KNEE 24 UNIV (MISCELLANEOUS) IMPLANT
KIT BASIN OR (CUSTOM PROCEDURE TRAY) ×1 IMPLANT
KIT TURNOVER KIT B (KITS) ×1 IMPLANT
MANIFOLD NEPTUNE II (INSTRUMENTS) ×1 IMPLANT
NDL HYPO 25GX1X1/2 BEV (NEEDLE) IMPLANT
NEEDLE HYPO 25GX1X1/2 BEV (NEEDLE) ×1 IMPLANT
NS IRRIG 1000ML POUR BTL (IV SOLUTION) ×1 IMPLANT
PACK TOTAL JOINT (CUSTOM PROCEDURE TRAY) ×1 IMPLANT
PACK UNIVERSAL I (CUSTOM PROCEDURE TRAY) ×1 IMPLANT
PAD ARMBOARD 7.5X6 YLW CONV (MISCELLANEOUS) ×2 IMPLANT
PAD CAST 4YDX4 CTTN HI CHSV (CAST SUPPLIES) ×1 IMPLANT
PADDING CAST COTTON 4X4 STRL (CAST SUPPLIES)
PADDING CAST COTTON 6X4 STRL (CAST SUPPLIES) IMPLANT
SET HNDPC FAN SPRY TIP SCT (DISPOSABLE) ×1 IMPLANT
STAPLER VISISTAT 35W (STAPLE) IMPLANT
STRIP CLOSURE SKIN 1/2X4 (GAUZE/BANDAGES/DRESSINGS) ×1 IMPLANT
SUCTION FRAZIER HANDLE 10FR (MISCELLANEOUS) ×1
SUCTION TUBE FRAZIER 10FR DISP (MISCELLANEOUS) ×1 IMPLANT
SUT ETHILON 2 0 FS 18 (SUTURE) IMPLANT
SUT MON AB 3-0 SH 27 (SUTURE) ×1
SUT MON AB 3-0 SH27 (SUTURE) IMPLANT
SUT VIC AB 0 CT1 27 (SUTURE) ×2
SUT VIC AB 0 CT1 27XBRD ANBCTR (SUTURE) ×2 IMPLANT
SUT VIC AB 1 CT1 27 (SUTURE) ×2
SUT VIC AB 1 CT1 27XBRD ANBCTR (SUTURE) ×2 IMPLANT
SUT VIC AB 2-0 CTB1 (SUTURE) ×2 IMPLANT
SWAB CULTURE ESWAB REG 1ML (MISCELLANEOUS) ×1 IMPLANT
SYR CONTROL 10ML LL (SYRINGE) IMPLANT
TOWEL GREEN STERILE (TOWEL DISPOSABLE) ×1 IMPLANT
TOWEL GREEN STERILE FF (TOWEL DISPOSABLE) ×1 IMPLANT
WATER STERILE IRR 1000ML POUR (IV SOLUTION) ×3 IMPLANT

## 2023-01-24 NOTE — Anesthesia Postprocedure Evaluation (Signed)
Anesthesia Post Note  Patient: Dennis Bates  Procedure(s) Performed: TOTAL KNEE REVISION (Right: Knee) IRRIGATION AND DEBRIDEMENT KNEE (Right: Knee)     Patient location during evaluation: PACU Anesthesia Type: General Level of consciousness: awake and alert, patient cooperative and oriented Pain management: pain level controlled Vital Signs Assessment: post-procedure vital signs reviewed and stable Respiratory status: spontaneous breathing, nonlabored ventilation and respiratory function stable Cardiovascular status: blood pressure returned to baseline and stable Postop Assessment: no apparent nausea or vomiting Anesthetic complications: no   No notable events documented.  Last Vitals:  Vitals:   01/24/23 1830 01/24/23 1846  BP: (!) 146/78 (!) 141/79  Pulse: 65 65  Resp: 14 14  Temp: 36.5 C 36.6 C  SpO2: 93% 94%    Last Pain:  Vitals:   01/24/23 1846  TempSrc: Oral  PainSc:                  Maybelline Kolarik,E. Biana Haggar

## 2023-01-24 NOTE — Progress Notes (Signed)
Subjective:  C/o left sided groin pain in area of lymph node   Antibiotics:  Anti-infectives (From admission, onward)    Start     Dose/Rate Route Frequency Ordered Stop   01/24/23 0600  ceFAZolin (ANCEF) IVPB 2g/100 mL premix        2 g 200 mL/hr over 30 Minutes Intravenous On call to O.R. 01/23/23 1914 01/25/23 0559   01/20/23 1100  ampicillin (OMNIPEN) 2 g in sodium chloride 0.9 % 100 mL IVPB        2 g 300 mL/hr over 20 Minutes Intravenous Every 4 hours 01/20/23 0834     01/20/23 1100  cefTRIAXone (ROCEPHIN) 2 g in sodium chloride 0.9 % 100 mL IVPB        2 g 200 mL/hr over 30 Minutes Intravenous Every 12 hours 01/20/23 0834     01/20/23 0415  piperacillin-tazobactam (ZOSYN) IVPB 3.375 g        3.375 g 100 mL/hr over 30 Minutes Intravenous  Once 01/20/23 0403 01/20/23 0500       Medications: Scheduled Meds:  carvedilol  3.125 mg Oral BID   gemfibrozil  600 mg Oral BID AC   insulin aspart  0-15 Units Subcutaneous TID WC   insulin aspart  0-5 Units Subcutaneous QHS   multivitamin with minerals  1 tablet Oral Daily   ondansetron (ZOFRAN) IV  4 mg Intravenous Once   rosuvastatin  20 mg Oral QHS   Continuous Infusions:  ampicillin (OMNIPEN) IV 2 g (01/24/23 1154)    ceFAZolin (ANCEF) IV     cefTRIAXone (ROCEPHIN)  IV 2 g (01/24/23 0920)   lactated ringers 75 mL/hr at 01/23/23 2327   PRN Meds:.acetaminophen **OR** acetaminophen, hydrALAZINE, morphine injection, ondansetron **OR** ondansetron (ZOFRAN) IV, oxyCODONE    Objective: Weight change:  No intake or output data in the 24 hours ending 01/24/23 1157 Blood pressure 127/75, pulse 68, temperature 98.5 F (36.9 C), temperature source Oral, resp. rate 18, height 5\' 7"  (1.702 m), weight 77.3 kg, SpO2 93 %. Temp:  [98.1 F (36.7 C)-98.8 F (37.1 C)] 98.5 F (36.9 C) (05/13 0802) Pulse Rate:  [67-72] 68 (05/13 0353) Resp:  [10-26] 18 (05/13 0353) BP: (116-131)/(70-80) 127/75 (05/13 0802) SpO2:  [93  %-100 %] 93 % (05/13 0353)  Physical Exam: Physical Exam Constitutional:      Appearance: He is well-developed.  HENT:     Head: Normocephalic and atraumatic.  Eyes:     Conjunctiva/sclera: Conjunctivae normal.  Cardiovascular:     Rate and Rhythm: Normal rate and regular rhythm.  Pulmonary:     Effort: Pulmonary effort is normal. No respiratory distress.     Breath sounds: No wheezing.  Abdominal:     General: There is no distension.     Palpations: Abdomen is soft.  Musculoskeletal:     Cervical back: Normal range of motion and neck supple.     Right knee: Swelling and effusion present.  Lymphadenopathy:     Lower Body: Right inguinal adenopathy present. No left inguinal adenopathy.  Skin:    General: Skin is warm and dry.     Findings: No erythema or rash.  Neurological:     General: No focal deficit present.     Mental Status: He is alert and oriented to person, place, and time.  Psychiatric:        Mood and Affect: Mood normal.        Behavior: Behavior normal.  Thought Content: Thought content normal.        Judgment: Judgment normal.      CBC:    BMET Recent Labs    01/23/23 0602  NA 136  K 4.1  CL 102  CO2 25  GLUCOSE 154*  BUN 5*  CREATININE 0.77  CALCIUM 8.7*     Liver Panel  No results for input(s): "PROT", "ALBUMIN", "AST", "ALT", "ALKPHOS", "BILITOT", "BILIDIR", "IBILI" in the last 72 hours.     Sedimentation Rate No results for input(s): "ESRSEDRATE" in the last 72 hours. C-Reactive Protein No results for input(s): "CRP" in the last 72 hours.  Micro Results: Recent Results (from the past 720 hour(s))  Culture, blood (routine x 2)     Status: Abnormal   Collection Time: 01/19/23  9:34 AM   Specimen: BLOOD  Result Value Ref Range Status   Specimen Description   Final    BLOOD LEFT ARM Performed at [facility], [address] [place], [place] [address]    Special Requests   Final    BOTTLES DRAWN  AEROBIC AND ANAEROBIC Blood Culture adequate volume Performed at [facility], [address] [place], [place] [address]    Culture  Setup Time   Final    GRAM POSITIVE COCCI IN BOTH AEROBIC AND ANAEROBIC BOTTLES CRITICAL RESULT CALLED TO, READ BACK BY AND VERIFIED WITH: KELLY GIBSON,RN@0039  01/20/23 MK Performed at [facility], [address], [place], [place] [address]    Culture ENTEROCOCCUS FAECALIS (A)  Final   Report Status 01/22/2023 FINAL  Final   Organism ID, Bacteria ENTEROCOCCUS FAECALIS  Final      Susceptibility   Enterococcus faecalis - MIC*    AMPICILLIN <=2 SENSITIVE Sensitive     VANCOMYCIN 1 SENSITIVE Sensitive     GENTAMICIN SYNERGY SENSITIVE Sensitive     * ENTEROCOCCUS FAECALIS  Blood Culture ID Panel (Reflexed)     Status: Abnormal   Collection Time: 01/19/23  9:34 AM  Result Value Ref Range Status   Enterococcus faecalis DETECTED (A) NOT DETECTED Final    Comment: CRITICAL RESULT CALLED TO, READ BACK BY AND VERIFIED WITH: KELLY GIBSON,RN@0040  01/20/23 MK    Enterococcus Faecium NOT DETECTED NOT DETECTED Final   Listeria monocytogenes NOT DETECTED NOT DETECTED Final   Staphylococcus species NOT DETECTED NOT DETECTED Final   Staphylococcus aureus (BCID) NOT DETECTED NOT DETECTED Final   Staphylococcus epidermidis NOT DETECTED NOT DETECTED Final   Staphylococcus lugdunensis NOT DETECTED NOT DETECTED Final   Streptococcus species NOT DETECTED NOT DETECTED Final   Streptococcus agalactiae NOT DETECTED NOT DETECTED Final   Streptococcus pneumoniae NOT DETECTED NOT DETECTED Final   Streptococcus pyogenes NOT DETECTED NOT DETECTED Final   A.calcoaceticus-baumannii NOT DETECTED NOT DETECTED Final   Bacteroides fragilis NOT DETECTED NOT DETECTED Final   Enterobacterales NOT DETECTED NOT DETECTED Final   Enterobacter cloacae complex NOT DETECTED NOT DETECTED Final   Escherichia coli NOT DETECTED NOT DETECTED Final   Klebsiella aerogenes  NOT DETECTED NOT DETECTED Final   Klebsiella oxytoca NOT DETECTED NOT DETECTED Final   Klebsiella pneumoniae NOT DETECTED NOT DETECTED Final   Proteus species NOT DETECTED NOT DETECTED Final   Salmonella species NOT DETECTED NOT DETECTED Final   Serratia marcescens NOT DETECTED NOT DETECTED Final   Haemophilus influenzae NOT DETECTED NOT DETECTED Final   Neisseria meningitidis NOT DETECTED NOT DETECTED Final   Pseudomonas aeruginosa NOT DETECTED NOT DETECTED Final   Stenotrophomonas maltophilia NOT DETECTED  NOT DETECTED Final   Candida albicans NOT DETECTED NOT DETECTED Final   Candida auris NOT DETECTED NOT DETECTED Final   Candida glabrata NOT DETECTED NOT DETECTED Final   Candida krusei NOT DETECTED NOT DETECTED Final   Candida parapsilosis NOT DETECTED NOT DETECTED Final   Candida tropicalis NOT DETECTED NOT DETECTED Final   Cryptococcus neoformans/gattii NOT DETECTED NOT DETECTED Final   Vancomycin resistance NOT DETECTED NOT DETECTED Final    Comment: Performed at [facility], [address], [place], [place] [address]  Culture, blood (routine x 2)     Status: Abnormal   Collection Time: 01/19/23 10:15 AM   Specimen: BLOOD  Result Value Ref Range Status   Specimen Description   Final    BLOOD BLOOD RIGHT FOREARM Performed at [facility], [address] [place], [place] [address]    Special Requests   Final    BOTTLES DRAWN AEROBIC AND ANAEROBIC Blood Culture adequate volume Performed at [facility], [address] [place], [place] [address]    Culture  Setup Time   Final    GRAM POSITIVE COCCI IN CHAINS IN BOTH AEROBIC AND ANAEROBIC BOTTLES CRITICAL VALUE NOTED.  VALUE IS CONSISTENT WITH PREVIOUSLY REPORTED AND CALLED VALUE.    Culture (A)  Final    ENTEROCOCCUS FAECALIS SUSCEPTIBILITIES PERFORMED ON PREVIOUS CULTURE WITHIN THE LAST 5 DAYS. Performed at [facility], [address] [place], [place] [address]     Report Status 01/22/2023 FINAL  Final  Resp panel by RT-PCR (RSV, Flu A&B, Covid) Anterior Nasal Swab     Status: None   Collection Time: 01/19/23 12:08 PM   Specimen: Anterior Nasal Swab  Result Value Ref Range Status   SARS Coronavirus 2 by RT PCR NEGATIVE NEGATIVE Final    Comment: (NOTE) SARS-CoV-2 target nucleic acids are NOT DETECTED.  The SARS-CoV-2 RNA is generally detectable in upper respiratory specimens during the acute phase of infection. The lowest concentration of SARS-CoV-2 viral copies this assay can detect is 138 copies/mL. A negative result does not preclude SARS-Cov-2 infection and should not be used as the sole basis for treatment or other patient management decisions. A negative result may occur with  improper specimen collection/handling, submission of specimen other than nasopharyngeal swab, presence of viral mutation(s) within the areas targeted by this assay, and inadequate number of viral copies(<138 copies/mL). A negative result must be combined with clinical observations, patient history, and epidemiological information. The expected result is Negative.  Fact Sheet for Patients:  BloggerCourse.com  Fact Sheet for Healthcare Providers:  SeriousBroker.it  This test is no t yet approved or cleared by the [place] FDA and  has been authorized for detection and/or diagnosis of SARS-CoV-2 by FDA under an Emergency Use Authorization (EUA). This EUA will remain  in effect (meaning this test can be used) for the duration of the COVID-19 declaration under Section 564(b)(1) of the Act, 21 U.S.C.section 360bbb-3(b)(1), unless the authorization is terminated  or revoked sooner.       Influenza A by PCR NEGATIVE NEGATIVE Final   Influenza B by PCR NEGATIVE NEGATIVE Final    Comment: (NOTE) The Xpert Xpress SARS-CoV-2/FLU/RSV plus assay is intended as an aid in the diagnosis of influenza from Nasopharyngeal  swab specimens and should not be used as a sole basis for treatment. Nasal washings and aspirates are unacceptable for Xpert Xpress SARS-CoV-2/FLU/RSV testing.  Fact Sheet for Patients: BloggerCourse.com  Fact Sheet for Healthcare Providers: SeriousBroker.it  This test is not yet approved or cleared by the [place] and has been authorized for detection and/or diagnosis of SARS-CoV-2 by FDA under an Emergency Use Authorization (EUA). This EUA will remain in effect (meaning this test can be used) for the duration of the COVID-19 declaration under Section 564(b)(1) of the Act, 21 U.S.C. section 360bbb-3(b)(1), unless the authorization is terminated or revoked.     Resp Syncytial Virus by PCR NEGATIVE NEGATIVE Final    Comment: (NOTE) Fact Sheet for Patients: BloggerCourse.com  Fact Sheet for Healthcare Providers: SeriousBroker.it  This test is not yet approved or cleared by the [place] FDA and has been authorized for detection and/or diagnosis of SARS-CoV-2 by FDA under an Emergency Use Authorization (EUA). This EUA will remain in effect (meaning this test can be used) for the duration of the COVID-19 declaration under Section 564(b)(1) of the Act, 21 U.S.C. section 360bbb-3(b)(1), unless the authorization is terminated or revoked.  Performed at [facility], [address] [place], [place] [address]   Body fluid culture w Gram Stain     Status: None   Collection Time: 01/20/23  7:03 PM   Specimen: Body Fluid  Result Value Ref Range Status   Specimen Description FLUID  Final   Special Requests KNEE  Final   Gram Stain   Final    ABUNDANT WBC PRESENT, PREDOMINANTLY PMN NO ORGANISMS SEEN    Culture   Final    FEW ENTEROCOCCUS FAECALIS CRITICAL RESULT CALLED TO, READ BACK BY AND VERIFIED WITH: RN Oren Beckmann [phone] [id number] FCP Performed at  [facility], [address] [place], [place] [address]    Report Status 01/23/2023 FINAL  Final   Organism ID, Bacteria ENTEROCOCCUS FAECALIS  Final      Susceptibility   Enterococcus faecalis - MIC*    AMPICILLIN <=2 SENSITIVE Sensitive     VANCOMYCIN 1 SENSITIVE Sensitive     GENTAMICIN SYNERGY SENSITIVE Sensitive     * FEW ENTEROCOCCUS FAECALIS  Culture, blood (Routine X 2) w Reflex to ID Panel     Status: None (Preliminary result)   Collection Time: 01/21/23  5:46 AM   Specimen: BLOOD RIGHT ARM  Result Value Ref Range Status   Specimen Description BLOOD RIGHT ARM  Final   Special Requests   Final    BOTTLES DRAWN AEROBIC AND ANAEROBIC Blood Culture results may not be optimal due to an excessive volume of blood received in culture bottles   Culture   Final    NO GROWTH 3 DAYS Performed at [facility], [address] [place], [place] [address]    Report Status PENDING  Incomplete  Culture, blood (Routine X 2) w Reflex to ID Panel     Status: None (Preliminary result)   Collection Time: 01/21/23  5:47 AM   Specimen: BLOOD LEFT HAND  Result Value Ref Range Status   Specimen Description BLOOD LEFT HAND  Final   Special Requests   Final    BOTTLES DRAWN AEROBIC AND ANAEROBIC Blood Culture adequate volume   Culture   Final    NO GROWTH 3 DAYS Performed at [facility], [address], [place], [place] [address]    Report Status PENDING  Incomplete  MRSA Next Gen by PCR, Nasal     Status: None   Collection Time: 01/23/23 10:36 PM   Specimen: Nasal Mucosa; Nasal Swab  Result Value Ref Range Status   MRSA by PCR  Next Gen NOT DETECTED NOT DETECTED Final    Comment: (NOTE) The GeneXpert MRSA Assay (FDA approved for NASAL specimens only), is one component of a comprehensive MRSA colonization surveillance program. It is not intended to diagnose MRSA infection nor to guide or monitor treatment for MRSA infections. Test performance is not FDA approved in patients  less than 62 years old. Performed at [facility], [address] [place], [place] [address]     Studies/Results: No results found.    Assessment/Plan:  INTERVAL HISTORY: pt going to OR today   Principal Problem:   Enterococcal bacteremia Active Problems:   Type 2 diabetes mellitus with hyperglycemia (HCC)   Gallstones   Prosthetic valve endocarditis (HCC)   Chronic infection of prosthetic knee (HCC)   Anticoagulated on warfarin   Essential hypertension   Dyslipidemia    Dennis Bates is a 62 y.o. 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 His TEE was clean.  #1 E faecalis bacteremia and PJI  Given AVR refer to make sure he gets 6 weeks of dual beta-lactam therapy in case he has undiagnosed endocarditis.  Will follow this with high-dose oral amoxicillin to get to 6 months if not a year  #2 Pain in groin; ROM of hip joint is good. He has an enlarged lymph node in the right inguinal area.  He told me that this improved after his knee had been aspirated but now is recurring.    I have personally spent 50 minutes involved in face-to-face and non-face-to-face activities for this patient on the day of the visit. Professional time spent includes the following activities: Preparing to see the patient (review of tests), Obtaining and/or reviewing separately obtained history (admission/discharge record), Performing a medically appropriate examination and/or evaluation , Ordering medications/tests/procedures, referring and communicating with other health care professionals, Documenting clinical information in the EMR, Independently interpreting results (not separately reported), Communicating results to the patient/family/caregiver, Counseling and educating the patient/family/caregiver and Care coordination (not separately reported).     LOS: 4 days   Acey Lav 01/24/2023, 11:57 AM

## 2023-01-24 NOTE — Anesthesia Preprocedure Evaluation (Signed)
Anesthesia Evaluation    Airway Mallampati: II  TM Distance: >3 FB Neck ROM: Full    Dental  (+) Missing, Dental Advisory Given   Pulmonary    breath sounds clear to auscultation       Cardiovascular hypertension, Pt. on medications and Pt. on home beta blockers + CAD, + Past MI and + Cardiac Stents  + Valvular Problems/Murmurs (s/p AVR [place])  Rhythm:Regular Rate:Normal  01/21/2023 ECHO:  1. No obvious vegetations.  2. The left ventricle has normal function.  3. Right ventricular systolic function is normal. The right ventricular size is normal.  4. No left atrial/left atrial appendage thrombus was detected.  5. The mitral valve is normal in structure. Mild MR.  6. S/p AVR (23 mm St Jude mechanical prosthesis; procedure 10/09/2015). Some shadowing present from metal. No obvious vegetations seen. . AI is trivial.  7. There is mild (Grade II) plaque.    Neuro/Psych H/o Bell's    GI/Hepatic   Endo/Other  diabetes (glu 112), Oral Hypoglycemic Agents    Renal/GU      Musculoskeletal  (+) Arthritis ,    Abdominal   Peds  Hematology  (+) Blood dyscrasia (Hb 10.8), anemia INR 1.5 Heparin gtt off   Anesthesia Other Findings   Reproductive/Obstetrics                              Anesthesia Physical Anesthesia Plan  ASA: 3  Anesthesia Plan: General   Post-op Pain Management: Ofirmev IV (intra-op)*   Induction: Intravenous  PONV Risk Score and Plan: 2 and Ondansetron, Dexamethasone and Treatment may vary due to age or medical condition  Airway Management Planned: Oral ETT  Additional Equipment: None  Intra-op Plan:   Post-operative Plan: Extubation in OR  Informed Consent: I have reviewed the patients History and Physical, chart, labs and discussed the procedure including the risks, benefits and alternatives for the proposed anesthesia with the patient or authorized representative who  has indicated his/her understanding and acceptance.     Dental advisory given  Plan Discussed with: CRNA and Surgeon  Anesthesia Plan Comments:          Anesthesia Quick Evaluation

## 2023-01-24 NOTE — TOC Progression Note (Signed)
Transition of Care [facility]) - Progression Note    Patient Details  Name: Dennis Bates MRN: [id number] Date of Birth: [birth year]  [facility] CM/SW Contact  Gordy Clement, RN Phone Number: 01/24/2023, 11:00 AM  Clinical Narrative:     Patient plan is to DC to home on IV ABX Wednesday 5/15 per progression discussion. [facility] and [facility] notified . Bedside teaching to take place [facility] will continue to follow     Expected Discharge Plan: Home w Home Health Services (possible IVABX) Barriers to Discharge: Continued Medical Work up  Expected Discharge Plan and Services                                               Social Determinants of Health (SDOH) Interventions SDOH Screenings   Food Insecurity: No Food Insecurity (01/20/2023)  Housing: Low Risk  (01/20/2023)  Transportation Needs: No Transportation Needs (01/20/2023)  Utilities: Not At Risk (01/20/2023)  Depression (PHQ2-9): Low Risk  (01/17/2023)  Tobacco Use: Medium Risk (01/21/2023)    Readmission Risk Interventions     No data to display

## 2023-01-24 NOTE — Progress Notes (Addendum)
Pt received back from OR.Alert oriented*4.Vitals stable.Callbell on reach.Family bedside.

## 2023-01-24 NOTE — Brief Op Note (Signed)
01/20/2023 - 01/24/2023  5:27 PM  PATIENT:  Dennis Bates  62 y.o. male  PRE-OPERATIVE DIAGNOSIS:  RIGHT TOTAL KNEE INFECTION  POST-OPERATIVE DIAGNOSIS:  RIGHT TOTAL KNEE INFECTION  PROCEDURE:  Procedure(s): TOTAL KNEE REVISION (Right) IRRIGATION AND DEBRIDEMENT KNEE (Right)  SURGEON:  Surgeon(s) and Role:    Jodi Geralds, MD - Primary  PHYSICIAN ASSISTANT:   ASSISTANTS: Gus Puma   ANESTHESIA:   general  EBL:  50 mL   BLOOD ADMINISTERED:none  DRAINS: (large) Hemovact drain(s) in the r knee with  Suction Open   LOCAL MEDICATIONS USED:  MARCAINE     SPECIMEN:  No Specimen  DISPOSITION OF SPECIMEN:  N/A  COUNTS:  YES  TOURNIQUET:   Total Tourniquet Time Documented: Thigh (Right) - 46 minutes Total: Thigh (Right) - 46 minutes   DICTATION: .Other Dictation: Dictation Number [id number]  PLAN OF CARE: Admit to inpatient   PATIENT DISPOSITION:  PACU - hemodynamically stable.   Delay start of Pharmacological VTE agent (>24hrs) due to surgical blood loss or risk of bleeding: no

## 2023-01-24 NOTE — Op Note (Unsigned)
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   PREOPERATIVE DIAGNOSES:  Infected right total knee, status post total knee replacement 5 years ago with enterococcus bacteremia and enterococcus growing from cultures from aspiration of the knee.  Total duration of symptoms was less than 2 weeks.  POSTOPERATIVE DIAGNOSES: 1.  Infected right total knee, status post total knee replacement 5 years ago with enterococcus bacteremia and enterococcus growing from cultures from aspiration of the knee.  Total duration of symptoms was less than 2 weeks. 2.  Retained foreign body, right knee. 3.  Extensive synovial proliferation, right knee.  PRINCIPAL PROCEDURES: 1.  Total knee revision of tibial component by way of poly swap. 2. Four compartment synovectomy extensive right knee including medial, lateral, posterior, and anterior. 3.  Removal of foreign body, right knee.  SURGEON:  Dr.  Luiz Blare.  ASSISTANT: Gus Puma, PA-C, who was present for the entire case and assisted by retraction of tissues, manipulation of the leg, and closing to minimize OR time.  BRIEF HISTORY:  Mr. Fiers is a 62 year old male who had a total knee replacement done by me 5 years ago.  I saw him 2 months after his surgery.  I have not seen him since.  I was called by the orthopedic PA on call when he presented to the [facility]  Emergency Room with enterococcus bacteremia and there was some concern about fluid in the knee.  He attempted aspiration, but was unsuccessful.  I saw him later that evening and was very much concerned about the warmth and fluid in the knee.  I aspirated  him for 40 mL of cloudy fluid, which ultimately grew out enterococcus.  We, at that point, needed to get him off of  Coumadin, get his INR corrected and also wanted the knee to calm down and so we continued him on IV antibiotics.  He was on a heparin  drip and we kept him on Coumadin and then took him off the Coumadin for his INR to correct and he was brought to the operating room once the INR corrected for surgical intervention.  DESCRIPTION OF PROCEDURE:  The patient was brought to the operating room.  After adequate anesthesia was obtained with a general anesthetic, the patient was placed on the operating table.  The right leg was prepped and draped in the usual sterile  fashion.  Following this, the leg was elevated for 2 minutes and blood pressure, tourniquet inflated to 250 mmHg.  Following this, the old incision was exploited.  Subcutaneous tissue down to the level of the medial parapatella and a medial parapatellar  arthrotomy was undertaken.  Significant cloudy fluid was obtained that was found at this point and was sent for Gram stain and culture.  At that point, we got the knee exposed and did an extensive and incredibly thorough synovectomy.  We went all around  the medial side.  We went over the anterior aspect of the periosteum of the femur.  We went over the lateral side.  We took synovial proliferation off the quad tendon off the back of the patellar tendon.  We did an elliptical synovectomy around the  patella.  We really got into the back and took quite a bit of synovial proliferation out of the back of the knee,  medial in the knee as well.  At this time, we were able to then gain access to the knee and while we were doing the synovectomy  superolaterally, we encountered what appeared to be some retained suture as we kind of identified that and followed it more closely.  It tracked right into the femur on the distal lateral side, so I got a drill out and we drilled that hole open bigger  and we were able to put some Betadine peroxide Q-tips in that hole.  We then put Betadine and peroxide throughout the  knee, washed it out thoroughly for about 10 minutes and then suctioned that out.  We again passed some Q-tips into this area that we had  opened in the distal femoral cortex.  Once that was done, we then put topical TXA into the knee 100 mL and let it sit for 5 minutes after the tourniquet had been let down and this did a nice coagulation of the tissues around the knee.  At that point, we  took some vancomycin powder and stuffed it into that hole, opening in the femur and then we sprinkled vancomycin powder throughout the knee.  We had trialled the knee, originally it had a 7 poly.  We trialed up to an 8 poly and that seemed to still be  of good positioning and fit, a 10 poly was too tight, so we opened and placed an 8 poly prior to doing the TXA and prior to placing the vancomycin powder, so ultimately we had irrigated with 6 liters of saline as well as a liter of Betadine peroxide  mixture and then did the TXA and then put the vancomycin powder in.  We closed the medial parapatellar arthrotomy with 1 Vicryl running.  We used Marcaine and epinephrine throughout the synovial reflection of the knee to help for postoperative pain  control and bleeding control.  We placed a large Hemovac drain 2 limbs through the lateral side and a hooked these to a suction.  Medial parapatellar arthrotomy was closed with 1 Vicryl running, skin with 2-0 Vicryl.  We used staples on the skin just to  try to minimize the amount of foreign material in the skin.  At this point, the patient had easy and excellent range of motion.  Knee immobilizer was placed.  He was taken to recovery, was noted to be in satisfactory condition.  ESTIMATED BLOOD LOSS:  For the procedure was 50 mL.   MUK D: 01/24/2023 5:36:01 pm T: 01/24/2023 10:48:00 pm  JOB: [id number]/ [id number]

## 2023-01-24 NOTE — Transfer of Care (Signed)
Immediate Anesthesia Transfer of Care Note  Patient: Dennis Bates  Procedure(s) Performed: TOTAL KNEE REVISION (Right: Knee) IRRIGATION AND DEBRIDEMENT KNEE (Right: Knee)  Patient Location: PACU  Anesthesia Type:General  Level of Consciousness: awake, alert , oriented, and patient cooperative  Airway & Oxygen Therapy: Patient Spontanous Breathing  Post-op Assessment: Report given to RN and Post -op Vital signs reviewed and stable  Post vital signs: Reviewed and stable  Last Vitals:  Vitals Value Taken Time  BP 138/77 01/24/23 1800  Temp    Pulse 69 01/24/23 1803  Resp 17 01/24/23 1803  SpO2 92 % 01/24/23 1803  Vitals shown include unvalidated device data.  Last Pain:  Vitals:   01/24/23 1332  TempSrc: Oral  PainSc: 0-No pain      Patients Stated Pain Goal: 1 (01/23/23 2138)  Complications: No notable events documented.

## 2023-01-24 NOTE — Progress Notes (Signed)
PROGRESS NOTE        PATIENT DETAILS Name: Dennis Bates Age: 62 y.o. Sex: male Date of Birth: [birth year] Admit Date: 01/20/2023 Admitting Physician Jonah Blue, MD UJW:[id number], Elberta Fortis, MD  Brief Summary: Patient is a 62 y.o.  male with history of aortic stenosis-s/p AVR (mechanical valve) in 2016, CAD s/p PCI 2016, HLD, HTN, s/p right total knee arthroplasty 2019 who presented with 2-3 weeks history of subjective fever, abdominal pain, back pain, right knee swelling-she was seen at a local urgent care-and had blood cultures drawn which was subsequently positive, patient was then admitted to the hospitalist service for further evaluation and treatment.  Significant events: 5/8>> eval [facility]-abdominal pain-subjective fever-blood cultures drawn. 5/9>> blood cultures positive for Enterococcus-referred to the ED-admit to [facility].  Significant studies: 5/8>> CT chest/abdomen/pelvis: No consolidation/pneumothorax/effusion-no bowel obstruction.  Gallstones in the nondilated gallbladder 5/8>> CT head: Age-indeterminate bilateral cerebellar infarcts 5/9>> x-ray right knee: Total knee arthroplasty-trace joint fluid 5/9>> echo: EF 55-60%-no obvious vegetation seen on prosthetic aortic valve. 5/10>> TEE: No vegetation  Significant microbiology data: 5/8>> blood culture: Enterococcus faecalis 5/9>> synovial fluid culture: Enterococcus faecalis 5/10>> blood culture: No growth  Procedures: 5/9>> arthrocentesis by orthopedics (WBC 54,250 with 76% neutrophils-no crystals) 5/10>> TEE: No vegetation  Consults: ID Orthopedics  Subjective: No major complaints-afebrile-sitting at bedside chair.  A bit more right knee swelling-he thinks this is because he walked quite a bit yesterday.  Objective: Vitals: Blood pressure 127/75, pulse 68, temperature 98.5 F (36.9 C), temperature source Oral, resp. rate 18, height 5\' 7"  (1.702 m), weight 77.3 kg, SpO2  93 %.   Exam: Gen Exam:Alert awake-not in any distress HEENT:atraumatic, normocephalic Chest: B/L clear to auscultation anteriorly CVS:S1S2 regular Abdomen:soft non tender, non distended Extremities: Right knee swelling-no erythema-nontender. 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 ID following Continue IV antibiotics per infectious disease.   History of mechanical aortic valve replacement Coumadin on hold-allowing INR to slowly drift down. IV heparin until we sure that patient has completed all procedures/surgeries  History of CAD s/p PCI 2016 No anginal symptoms  DM-2 (A1c 7.0 on 5/6) CBG stable on SSI Resume metformin/Jardiance on discharge.  Recent Labs    01/23/23 2105 01/24/23 0555 01/24/23 0801  GLUCAP 190* 132* 149*      HTN BP stable on Coreg  HLD Statin/Lopid  Cholelithiasis Asymptomatic at this point-primary problem is enterococcal infection-he will require outpatient elective general surgery evaluation for cholecystectomy.  BMI: Estimated body mass index is 26.69 kg/m as calculated from the following:   Height  as of this encounter: 5\' 7"  (1.702 m).   Weight as of this encounter: 77.3 kg.   Code status:   Code Status: Full Code   DVT Prophylaxis:IV heparin   Family Communication: None at bedside this morning   Disposition  Plan: Status is: Inpatient Remains inpatient appropriate because: Severity of illness   Planned Discharge Destination:Home with home health   Diet: Diet Order             Diet NPO time specified  Diet effective ____                     Antimicrobial agents: Anti-infectives (From admission, onward)    Start     Dose/Rate Route Frequency Ordered Stop   01/24/23 0600  ceFAZolin (ANCEF) IVPB 2g/100 mL premix        2 g 200 mL/hr over 30 Minutes Intravenous On call to O.R. 01/23/23 1914 01/25/23 0559   01/20/23 1100  ampicillin (OMNIPEN) 2 g in sodium chloride 0.9 % 100 mL IVPB        2 g 300 mL/hr over 20 Minutes Intravenous Every 4 hours 01/20/23 0834     01/20/23 1100  cefTRIAXone (ROCEPHIN) 2 g in sodium chloride 0.9 % 100 mL IVPB        2 g 200 mL/hr over 30 Minutes Intravenous Every 12 hours 01/20/23 0834     01/20/23 0415  piperacillin-tazobactam (ZOSYN) IVPB 3.375 g        3.375 g 100 mL/hr over 30 Minutes Intravenous  Once 01/20/23 0403 01/20/23 0500        MEDICATIONS: Scheduled Meds:  carvedilol  3.125 mg Oral BID   gemfibrozil  600 mg Oral BID AC   insulin aspart  0-15 Units Subcutaneous TID WC   insulin aspart  0-5 Units Subcutaneous QHS   multivitamin with minerals  1 tablet Oral Daily   ondansetron (ZOFRAN) IV  4 mg Intravenous Once   rosuvastatin  20 mg Oral QHS   Continuous Infusions:  ampicillin (OMNIPEN) IV 2 g (01/24/23 0816)    ceFAZolin (ANCEF) IV     cefTRIAXone (ROCEPHIN)  IV 2 g (01/24/23 0920)   lactated ringers 75 mL/hr at 01/23/23 2327   PRN Meds:.acetaminophen **OR** acetaminophen, hydrALAZINE, morphine injection, ondansetron **OR** ondansetron (ZOFRAN) IV, oxyCODONE   I have personally reviewed following labs and imaging studies  LABORATORY DATA: CBC: Recent Labs  Lab 01/17/23 1439 01/19/23 0846 01/20/23 0418 01/21/23 0546 01/22/23 0629 01/23/23 0602 01/24/23 0348  WBC 12.8*   < > 9.5 6.7 5.8 5.9 6.7  NEUTROABS 10.9*   --  7.5  --   --   --   --   HGB 14.1   < > 11.9* 11.1* 11.2* 10.9* 10.8*  HCT 41.3   < > 34.3* 31.5* 33.8* 33.4* 32.9*  MCV 84.8   < > 84.1 82.7 86.0 84.6 86.8  PLT 240.0   < > 234 246 299 338 397   < > = values in this interval not displayed.     Basic Metabolic Panel: Recent Labs  Lab 01/17/23 1439 01/19/23 0846 01/20/23 0418 01/21/23 0546 01/23/23 0602  NA 127* 132* 130* 135 136  K 4.1 4.2 3.7 3.8 4.1  CL 91* 97* 96* 101 102  CO2 23 23 21* 21* 25  GLUCOSE 97 156* 134* 121* 154*  BUN 22 21 15 13  5*  CREATININE 1.22 0.90 0.87 0.97 0.77  CALCIUM 9.8 9.7 9.1 8.8* 8.7*  GFR: Estimated Creatinine Clearance: 90.7 mL/min (by C-G formula based on SCr of 0.77 mg/dL).  Liver Function Tests: Recent Labs  Lab 01/17/23 1439 01/19/23 0846 01/20/23 0418  AST 23 28 28   ALT 30 25 26   ALKPHOS 40 38 41  BILITOT 1.2 1.4* 1.1  PROT 7.8 7.8 6.2*  ALBUMIN 4.6 4.3 3.0*    Recent Labs  Lab 01/17/23 1439 01/19/23 0846 01/20/23 0418  LIPASE 72.0* 166* 115*    Recent Labs  Lab 01/19/23 0934  AMMONIA 17     Coagulation Profile: Recent Labs  Lab 01/20/23 0418 01/21/23 0546 01/22/23 0629 01/23/23 0602 01/24/23 0348  INR 1.8* 2.1* 2.2* 2.1* 1.5*     Cardiac Enzymes: Recent Labs  Lab 01/19/23 0935  CKTOTAL 104     BNP (last 3 results) No results for input(s): "PROBNP" in the last 8760 hours.  Lipid Profile: No results for input(s): "CHOL", "HDL", "LDLCALC", "TRIG", "CHOLHDL", "LDLDIRECT" in the last 72 hours.  Thyroid Function Tests: No results for input(s): "TSH", "T4TOTAL", "FREET4", "T3FREE", "THYROIDAB" in the last 72 hours.  Anemia Panel: No results for input(s): "VITAMINB12", "FOLATE", "FERRITIN", "TIBC", "IRON", "RETICCTPCT" in the last 72 hours.  Urine analysis:    Component Value Date/Time   COLORURINE YELLOW 01/19/2023 1200   APPEARANCEUR CLEAR 01/19/2023 1200   LABSPEC >1.046 (H) 01/19/2023 1200   PHURINE 6.0 01/19/2023 1200    GLUCOSEU >1,000 (A) 01/19/2023 1200   HGBUR SMALL (A) 01/19/2023 1200   BILIRUBINUR NEGATIVE 01/19/2023 1200   KETONESUR 40 (A) 01/19/2023 1200   PROTEINUR TRACE (A) 01/19/2023 1200   NITRITE NEGATIVE 01/19/2023 1200   LEUKOCYTESUR NEGATIVE 01/19/2023 1200    Sepsis Labs: Lactic Acid, Venous    Component Value Date/Time   LATICACIDVEN 1.5 10/20/2015 2200    MICROBIOLOGY: Recent Results (from the past 240 hour(s))  Culture, blood (routine x 2)     Status: Abnormal   Collection Time: 01/19/23  9:34 AM   Specimen: BLOOD  Result Value Ref Range Status   Specimen Description   Final    BLOOD LEFT ARM Performed at [facility], [address], [place], [place] [address]    Special Requests   Final    BOTTLES DRAWN AEROBIC AND ANAEROBIC Blood Culture adequate volume Performed at [facility], [address], [place], [place] [address]    Culture  Setup Time   Final    GRAM POSITIVE COCCI IN BOTH AEROBIC AND ANAEROBIC BOTTLES CRITICAL RESULT CALLED TO, READ BACK BY AND VERIFIED WITH: KELLY GIBSON,RN@0039  01/20/23 MK Performed at [facility], [address], [place], [place] [address]    Culture ENTEROCOCCUS FAECALIS (A)  Final   Report Status 01/22/2023 FINAL  Final   Organism ID, Bacteria ENTEROCOCCUS FAECALIS  Final      Susceptibility   Enterococcus faecalis - MIC*    AMPICILLIN <=2 SENSITIVE Sensitive     VANCOMYCIN 1 SENSITIVE Sensitive     GENTAMICIN SYNERGY SENSITIVE Sensitive     * ENTEROCOCCUS FAECALIS  Blood Culture ID Panel (Reflexed)     Status: Abnormal   Collection Time: 01/19/23  9:34 AM  Result Value Ref Range Status   Enterococcus faecalis DETECTED (A) NOT DETECTED Final    Comment: CRITICAL RESULT CALLED TO, READ BACK BY AND VERIFIED WITH: KELLY GIBSON,RN@0040  01/20/23 MK    Enterococcus Faecium NOT DETECTED NOT DETECTED Final   Listeria monocytogenes NOT DETECTED NOT DETECTED Final   Staphylococcus species  NOT DETECTED NOT DETECTED  Final   Staphylococcus aureus (BCID) NOT DETECTED NOT DETECTED Final   Staphylococcus epidermidis NOT DETECTED NOT DETECTED Final   Staphylococcus lugdunensis NOT DETECTED NOT DETECTED Final   Streptococcus species NOT DETECTED NOT DETECTED Final   Streptococcus agalactiae NOT DETECTED NOT DETECTED Final   Streptococcus pneumoniae NOT DETECTED NOT DETECTED Final   Streptococcus pyogenes NOT DETECTED NOT DETECTED Final   A.calcoaceticus-baumannii NOT DETECTED NOT DETECTED Final   Bacteroides fragilis NOT DETECTED NOT DETECTED Final   Enterobacterales NOT DETECTED NOT DETECTED Final   Enterobacter cloacae complex NOT DETECTED NOT DETECTED Final   Escherichia coli NOT DETECTED NOT DETECTED Final   Klebsiella aerogenes NOT DETECTED NOT DETECTED Final   Klebsiella oxytoca NOT DETECTED NOT DETECTED Final   Klebsiella pneumoniae NOT DETECTED NOT DETECTED Final   Proteus species NOT DETECTED NOT DETECTED Final   Salmonella species NOT DETECTED NOT DETECTED Final   Serratia marcescens NOT DETECTED NOT DETECTED Final   Haemophilus influenzae NOT DETECTED NOT DETECTED Final   Neisseria meningitidis NOT DETECTED NOT DETECTED Final   Pseudomonas aeruginosa NOT DETECTED NOT DETECTED Final   Stenotrophomonas maltophilia NOT DETECTED NOT DETECTED Final   Candida albicans NOT DETECTED NOT DETECTED Final   Candida auris NOT DETECTED NOT DETECTED Final   Candida glabrata NOT DETECTED NOT DETECTED Final   Candida krusei NOT DETECTED NOT DETECTED Final   Candida parapsilosis NOT DETECTED NOT DETECTED Final   Candida tropicalis NOT DETECTED NOT DETECTED Final   Cryptococcus neoformans/gattii NOT DETECTED NOT DETECTED Final   Vancomycin resistance NOT DETECTED NOT DETECTED Final    Comment: Performed at [facility], [address], [place], [place] [address]  Culture, blood (routine x 2)     Status: Abnormal   Collection Time: 01/19/23 10:15 AM   Specimen: BLOOD  Result  Value Ref Range Status   Specimen Description   Final    BLOOD BLOOD RIGHT FOREARM Performed at [facility], [address] [place], [place] [address]    Special Requests   Final    BOTTLES DRAWN AEROBIC AND ANAEROBIC Blood Culture adequate volume Performed at [facility], [address], [place], [place] [address]    Culture  Setup Time   Final    GRAM POSITIVE COCCI IN CHAINS IN BOTH AEROBIC AND ANAEROBIC BOTTLES CRITICAL VALUE NOTED.  VALUE IS CONSISTENT WITH PREVIOUSLY REPORTED AND CALLED VALUE.    Culture (A)  Final    ENTEROCOCCUS FAECALIS SUSCEPTIBILITIES PERFORMED ON PREVIOUS CULTURE WITHIN THE LAST 5 DAYS. Performed at [facility], [address] [place], [place] [address]    Report Status 01/22/2023 FINAL  Final  Resp panel by RT-PCR (RSV, Flu A&B, Covid) Anterior Nasal Swab     Status: None   Collection Time: 01/19/23 12:08 PM   Specimen: Anterior Nasal Swab  Result Value Ref Range Status   SARS Coronavirus 2 by RT PCR NEGATIVE NEGATIVE Final    Comment: (NOTE) SARS-CoV-2 target nucleic acids are NOT DETECTED.  The SARS-CoV-2 RNA is generally detectable in upper respiratory specimens during the acute phase of infection. The lowest concentration of SARS-CoV-2 viral copies this assay can detect is 138 copies/mL. A negative result does not preclude SARS-Cov-2 infection and should not be used as the sole basis for treatment or other patient management decisions. A negative result may occur with  improper specimen collection/handling, submission of specimen other than nasopharyngeal swab, presence of viral mutation(s) within the areas targeted by this assay,  and inadequate number of viral copies(<138 copies/mL). A negative result must be combined with clinical observations, patient history, and epidemiological information. The expected result is Negative.  Fact Sheet for Patients:   BloggerCourse.com  Fact Sheet for Healthcare Providers:  SeriousBroker.it  This test is no t yet approved or cleared by the [place] FDA and  has been authorized for detection and/or diagnosis of SARS-CoV-2 by FDA under an Emergency Use Authorization (EUA). This EUA will remain  in effect (meaning this test can be used) for the duration of the COVID-19 declaration under Section 564(b)(1) of the Act, 21 U.S.C.section 360bbb-3(b)(1), unless the authorization is terminated  or revoked sooner.       Influenza A by PCR NEGATIVE NEGATIVE Final   Influenza B by PCR NEGATIVE NEGATIVE Final    Comment: (NOTE) The Xpert Xpress SARS-CoV-2/FLU/RSV plus assay is intended as an aid in the diagnosis of influenza from Nasopharyngeal swab specimens and should not be used as a sole basis for treatment. Nasal washings and aspirates are unacceptable for Xpert Xpress SARS-CoV-2/FLU/RSV testing.  Fact Sheet for Patients: BloggerCourse.com  Fact Sheet for Healthcare Providers: SeriousBroker.it  This test is not yet approved or cleared by the [place] FDA and has been authorized for detection and/or diagnosis of SARS-CoV-2 by FDA under an Emergency Use Authorization (EUA). This EUA will remain in effect (meaning this test can be used) for the duration of the COVID-19 declaration under Section 564(b)(1) of the Act, 21 U.S.C. section 360bbb-3(b)(1), unless the authorization is terminated or revoked.     Resp Syncytial Virus by PCR NEGATIVE NEGATIVE Final    Comment: (NOTE) Fact Sheet for Patients: BloggerCourse.com  Fact Sheet for Healthcare Providers: SeriousBroker.it  This test is not yet approved or cleared by the [place] FDA and has been authorized for detection and/or diagnosis of SARS-CoV-2 by FDA under an Emergency Use  Authorization (EUA). This EUA will remain in effect (meaning this test can be used) for the duration of the COVID-19 declaration under Section 564(b)(1) of the Act, 21 U.S.C. section 360bbb-3(b)(1), unless the authorization is terminated or revoked.  Performed at [facility], [address] [place], [place] [address]   Body fluid culture w Gram Stain     Status: None   Collection Time: 01/20/23  7:03 PM   Specimen: Body Fluid  Result Value Ref Range Status   Specimen Description FLUID  Final   Special Requests KNEE  Final   Gram Stain   Final    ABUNDANT WBC PRESENT, PREDOMINANTLY PMN NO ORGANISMS SEEN    Culture   Final    FEW ENTEROCOCCUS FAECALIS CRITICAL RESULT CALLED TO, READ BACK BY AND VERIFIED WITH: RN Oren Beckmann [phone] FCP Performed at [facility], [address] [place], [place] [address]    Report Status 01/23/2023 FINAL  Final   Organism ID, Bacteria ENTEROCOCCUS FAECALIS  Final      Susceptibility   Enterococcus faecalis - MIC*    AMPICILLIN <=2 SENSITIVE Sensitive     VANCOMYCIN 1 SENSITIVE Sensitive     GENTAMICIN SYNERGY SENSITIVE Sensitive     * FEW ENTEROCOCCUS FAECALIS  Culture, blood (Routine X 2) w Reflex to ID Panel     Status: None (Preliminary result)   Collection Time: 01/21/23  5:46 AM   Specimen: BLOOD RIGHT ARM  Result Value Ref Range Status   Specimen Description BLOOD RIGHT ARM  Final   Special Requests   Final  BOTTLES DRAWN AEROBIC AND ANAEROBIC Blood Culture results may not be optimal due to an excessive volume of blood received in culture bottles   Culture   Final    NO GROWTH 3 DAYS Performed at [facility], [address], [place], [place] [address]    Report Status PENDING  Incomplete  Culture, blood (Routine X 2) w Reflex to ID Panel     Status: None (Preliminary result)   Collection Time: 01/21/23  5:47 AM   Specimen: BLOOD LEFT HAND  Result Value Ref Range Status   Specimen Description  BLOOD LEFT HAND  Final   Special Requests   Final    BOTTLES DRAWN AEROBIC AND ANAEROBIC Blood Culture adequate volume   Culture   Final    NO GROWTH 3 DAYS Performed at [facility], [address] [place], [place] [address]    Report Status PENDING  Incomplete  MRSA Next Gen by PCR, Nasal     Status: None   Collection Time: 01/23/23 10:36 PM   Specimen: Nasal Mucosa; Nasal Swab  Result Value Ref Range Status   MRSA by PCR Next Gen NOT DETECTED NOT DETECTED Final    Comment: (NOTE) The GeneXpert MRSA Assay (FDA approved for NASAL specimens only), is one component of a comprehensive MRSA colonization surveillance program. It is not intended to diagnose MRSA infection nor to guide or monitor treatment for MRSA infections. Test performance is not FDA approved in patients less than 41 years old. Performed at [facility], [address], [place], [place] [address]     RADIOLOGY STUDIES/RESULTS: No results found.   LOS: 4 days   Jeoffrey Massed, MD  [facility]    To contact the attending provider between 7A-7P or the covering provider during after hours 7P-7A, please log into the web site [email or web address] and access using universal [place] password for that web site. If you do not have the password, please call the hospital operator.  01/24/2023, 11:35 AM

## 2023-01-24 NOTE — Anesthesia Procedure Notes (Signed)
Procedure Name: Intubation Date/Time: 01/24/2023 4:00 PM  Performed by: Alwyn Ren, CRNAPre-anesthesia Checklist: Patient identified, Emergency Drugs available, Suction available and Patient being monitored Patient Re-evaluated:Patient Re-evaluated prior to induction Oxygen Delivery Method: Circle system utilized Preoxygenation: Pre-oxygenation with 100% oxygen Induction Type: IV induction Ventilation: Mask ventilation without difficulty Laryngoscope Size: Miller and 3 Grade View: Grade I Tube type: Oral Tube size: 7.0 mm Number of attempts: 1 Airway Equipment and Method: Stylet and Oral airway Placement Confirmation: ETT inserted through vocal cords under direct vision, positive ETCO2 and breath sounds checked- equal and bilateral Secured at: 22 cm Tube secured with: Tape Dental Injury: Teeth and Oropharynx as per pre-operative assessment

## 2023-01-24 NOTE — Plan of Care (Signed)

## 2023-01-24 NOTE — Progress Notes (Signed)
ANTICOAGULATION CONSULT NOTE  Pharmacy Consult for heparin, warfarin Indication: mechanical aortic valve  Not on File  Patient Measurements: Height: 5\' 7"  (170.2 cm) Weight: 77.3 kg (170 lb 6.7 oz) IBW/kg (Calculated) : 66.1 Heparin Dosing Weight: 77.3  Vital Signs: Temp: 97.9 F (36.6 C) (05/13 1800) Temp Source: Oral (05/13 1800) BP: 138/77 (05/13 1800) Pulse Rate: 65 (05/13 1800)  Labs: Recent Labs    01/22/23 0629 01/22/23 1451 01/22/23 2253 01/23/23 0602 01/23/23 0926 01/24/23 0348  HGB 11.2*  --   --  10.9*  --  10.8*  HCT 33.8*  --   --  33.4*  --  32.9*  PLT 299  --   --  338  --  397  LABPROT 24.9*  --   --  23.5*  --  18.3*  INR 2.2*  --   --  2.1*  --  1.5*  HEPARINUNFRC <0.10*   < > 0.17*  --  0.37 0.36  CREATININE  --   --   --  0.77  --   --    < > = values in this interval not displayed.     Estimated Creatinine Clearance: 90.7 mL/min (by C-G formula based on SCr of 0.77 mg/dL).  Assessment: 62 yo male with h/o of mechanical aortic valve on warfarin PTA. Admitted with Enterococcus bacteremia. Patient on warfarin 10mg  daily except 15mg  on Mon and Thursdays per HML. Patient's INR was 4 on 5/6 (last dose of warfarin stated on 5/5). INR on admit is 1.8 (goal 2.5-3.5).   Patient transitioned to heparin drip while warfarin held for knee washout 5/13. Per discussion with [facility] and Ortho - will resume warfarin tonight and heparin drip at 0600 on 5/14 (12hr post-op) until INR therapeutic.  Goal of Therapy:  Heparin level 0.3 - 0.7  INR 2.5-3.5 Monitor platelets by anticoagulation protocol: Yes   Plan:  Warfarin 10mg  PO x 1 tonight At 06:00 tomorrow, resume heparin 2100 units/hr (no bolus) Check heparin level 6hrs after starting Daily heparin level, CBC, INR Monitor for s/sx of bleeding  Loralee Pacas, PharmD, BCPS Please see amion for complete clinical pharmacist phone list 01/24/2023 6:12 PM

## 2023-01-24 NOTE — H&P (Signed)
Subjective: Day of Surgery Procedure(s) (LRB): TOTAL KNEE REVISION (Right) IRRIGATION AND DEBRIDEMENT KNEE (Right) Patient reports pain as mild.    Objective: Vital signs in last 24 hours: Temp:  [98.1 F (36.7 C)-98.8 F (37.1 C)] 98.4 F (36.9 C) (05/13 1332) Pulse Rate:  [65-71] 65 (05/13 1332) Resp:  [10-19] 18 (05/13 1332) BP: (116-140)/(74-80) 140/76 (05/13 1332) SpO2:  [93 %-100 %] 99 % (05/13 1332)  Intake/Output from previous day: 05/12 0701 - 05/13 0700 In: 240 [P.O.:240] Out: -  Intake/Output this shift: No intake/output data recorded.  Recent Labs    01/22/23 0629 01/23/23 0602 01/24/23 0348  HGB 11.2* 10.9* 10.8*   Recent Labs    01/23/23 0602 01/24/23 0348  WBC 5.9 6.7  RBC 3.95* 3.79*  HCT 33.4* 32.9*  PLT 338 397   Recent Labs    01/23/23 0602  NA 136  K 4.1  CL 102  CO2 25  BUN 5*  CREATININE 0.77  GLUCOSE 154*  CALCIUM 8.7*   Recent Labs    01/23/23 0602 01/24/23 0348  INR 2.1* 1.5*    Neurologically intact ABD soft Neurovascular intact Sensation intact distally Intact pulses distally Dorsiflexion/Plantar flexion intact No cellulitis present Compartment soft Right knee 2+ effusion.  No instability.  Mild warmth.   Assessment/Plan: Day of Surgery Procedure(s) (LRB): TOTAL KNEE REVISION (Right) IRRIGATION AND DEBRIDEMENT KNEE (Right) Patient was taken the operating room for irrigation debridement and revision of knee by way of poly swap.  Cultures will be taken and we will do our best to reduce the bacterial load to his small as possible.  We will put in large-bore drains and leave those for several days until the drainage ceases.      Dennis Bates 01/24/2023, 3:21 PM

## 2023-01-25 ENCOUNTER — Encounter (HOSPITAL_COMMUNITY): Payer: Self-pay | Admitting: Orthopedic Surgery

## 2023-01-25 DIAGNOSIS — T826XXD Infection and inflammatory reaction due to cardiac valve prosthesis, subsequent encounter: Secondary | ICD-10-CM | POA: Diagnosis not present

## 2023-01-25 DIAGNOSIS — R591 Generalized enlarged lymph nodes: Secondary | ICD-10-CM | POA: Diagnosis not present

## 2023-01-25 DIAGNOSIS — I10 Essential (primary) hypertension: Secondary | ICD-10-CM | POA: Diagnosis not present

## 2023-01-25 DIAGNOSIS — M86251 Subacute osteomyelitis, right femur: Secondary | ICD-10-CM

## 2023-01-25 DIAGNOSIS — T8459XS Infection and inflammatory reaction due to other internal joint prosthesis, sequela: Secondary | ICD-10-CM | POA: Diagnosis not present

## 2023-01-25 DIAGNOSIS — T847XXA Infection and inflammatory reaction due to other internal orthopedic prosthetic devices, implants and grafts, initial encounter: Secondary | ICD-10-CM

## 2023-01-25 DIAGNOSIS — E1169 Type 2 diabetes mellitus with other specified complication: Secondary | ICD-10-CM

## 2023-01-25 DIAGNOSIS — T8453XA Infection and inflammatory reaction due to internal right knee prosthesis, initial encounter: Secondary | ICD-10-CM | POA: Diagnosis not present

## 2023-01-25 DIAGNOSIS — B952 Enterococcus as the cause of diseases classified elsewhere: Secondary | ICD-10-CM | POA: Diagnosis not present

## 2023-01-25 DIAGNOSIS — R7881 Bacteremia: Secondary | ICD-10-CM | POA: Diagnosis not present

## 2023-01-25 LAB — BASIC METABOLIC PANEL
Anion gap: 11 (ref 5–15)
BUN: 9 mg/dL (ref 8–23)
CO2: 25 mmol/L (ref 22–32)
Calcium: 8.8 mg/dL — ABNORMAL LOW (ref 8.9–10.3)
Chloride: 98 mmol/L (ref 98–111)
Creatinine, Ser: 0.83 mg/dL (ref 0.61–1.24)
GFR, Estimated: >60 mL/min (ref >60)
Glucose, Bld: 217 mg/dL — ABNORMAL HIGH (ref 70–99)
Potassium: 4.1 mmol/L (ref 3.5–5.1)
Sodium: 134 mmol/L — ABNORMAL LOW (ref 135–145)

## 2023-01-25 LAB — CBC
HCT: 35.7 % — ABNORMAL LOW (ref 39.0–52.0)
Hemoglobin: 11.8 g/dL — ABNORMAL LOW (ref 13.0–17.0)
MCH: 28.6 pg (ref 26.0–34.0)
MCHC: 33.1 g/dL (ref 30.0–36.0)
MCV: 86.7 fL (ref 80.0–100.0)
Platelets: 480 10*3/uL — ABNORMAL HIGH (ref 150–400)
RBC: 4.12 MIL/uL — ABNORMAL LOW (ref 4.22–5.81)
RDW: 13 % (ref 11.5–15.5)
WBC: 8.9 10*3/uL (ref 4.0–10.5)
nRBC: 0 % (ref 0.0–0.2)

## 2023-01-25 LAB — GLUCOSE, CAPILLARY
Glucose-Capillary: 173 mg/dL — ABNORMAL HIGH (ref 70–99)
Glucose-Capillary: 239 mg/dL — ABNORMAL HIGH (ref 70–99)
Glucose-Capillary: 139 mg/dL — ABNORMAL HIGH (ref 70–99)
Glucose-Capillary: 190 mg/dL — ABNORMAL HIGH (ref 70–99)

## 2023-01-25 LAB — PROTIME-INR
INR: 1.4 — ABNORMAL HIGH (ref 0.8–1.2)
Prothrombin Time: 17.6 seconds — ABNORMAL HIGH (ref 11.4–15.2)

## 2023-01-25 LAB — CULTURE, BLOOD (ROUTINE X 2)

## 2023-01-25 LAB — HEPARIN LEVEL (UNFRACTIONATED): Heparin Unfractionated: 0.37 IU/mL (ref 0.30–0.70)

## 2023-01-25 LAB — AEROBIC/ANAEROBIC CULTURE W GRAM STAIN (SURGICAL/DEEP WOUND)

## 2023-01-25 MED ORDER — WARFARIN SODIUM 7.5 MG PO TABS
15.0000 mg | ORAL_TABLET | Freq: Once | ORAL | Status: AC
  Administered 2023-01-25: 15 mg via ORAL
  Filled 2023-01-25: qty 2

## 2023-01-25 MED ORDER — WARFARIN SODIUM 7.5 MG PO TABS: 15.0000 mg | ORAL_TABLET | Freq: Once | ORAL | Status: DC

## 2023-01-25 NOTE — Progress Notes (Signed)
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 To discharging provider:  please sign these orders via discharge navigator,  Select New Orders & click on the button choice - Manage This Unsigned Work.     Thank you for allowing pharmacy to be a part of this patient's care.  Sharin Mons, PharmD, BCPS, BCIDP Infectious Diseases Clinical Pharmacist Phone: [phone] 01/25/2023, 1:32 PM

## 2023-01-25 NOTE — TOC Progression Note (Signed)
Transition of Care [facility]) - Progression Note    Patient Details  Name: Dennis Bates MRN: [id number] Date of Birth: [birth year]  Transition of Care [facility]) CM/SW Contact  Lawerance Sabal, RN Phone Number: 01/25/2023, 2:15 PM  Clinical Narrative:     Touched base with Pam from [facility], and Amy with [facility] to update on DC in next 1-2 days.   Expected Discharge Plan: Home w Home Health Services (possible IVABX) Barriers to Discharge: Continued Medical Work up  Expected Discharge Plan and Services                                               Social Determinants of Health (SDOH) Interventions SDOH Screenings   Food Insecurity: No Food Insecurity (01/20/2023)  Housing: Low Risk  (01/20/2023)  Transportation Needs: No Transportation Needs (01/20/2023)  Utilities: Not At Risk (01/20/2023)  Depression (PHQ2-9): Low Risk  (01/17/2023)  Tobacco Use: Medium Risk (01/25/2023)    Readmission Risk Interventions     No data to display

## 2023-01-25 NOTE — Progress Notes (Addendum)
ANTICOAGULATION CONSULT NOTE  Pharmacy Consult for heparin, warfarin Indication: mechanical aortic valve  Not on File  Patient Measurements: Height: 5\' 7"  (170.2 cm) Weight: 77.3 kg (170 lb 6.7 oz) IBW/kg (Calculated) : 66.1 Heparin Dosing Weight: 77.3  Vital Signs: Temp: 98.3 F (36.8 C) (05/14 1127) Temp Source: Oral (05/14 1127) BP: 129/75 (05/14 1127) Pulse Rate: 74 (05/14 1127)  Labs: Recent Labs    01/23/23 0602 01/23/23 0926 01/24/23 0348 01/25/23 0444 01/25/23 1205  HGB 10.9*  --  10.8* 11.8*  --   HCT 33.4*  --  32.9* 35.7*  --   PLT 338  --  397 480*  --   LABPROT 23.5*  --  18.3* 17.6*  --   INR 2.1*  --  1.5* 1.4*  --   HEPARINUNFRC  --  0.37 0.36  --  0.37  CREATININE 0.77  --   --  0.83  --      Estimated Creatinine Clearance: 87.4 mL/min (by C-G formula based on SCr of 0.83 mg/dL).  Assessment: 62 yo male with h/o of mechanical aortic valve on warfarin PTA. Admitted with Enterococcus bacteremia. Patient on warfarin 10mg  daily except 15mg  on Mon and Thursdays per HML. Patient's INR was 4 on 5/6 (last dose of warfarin stated on 5/5 prior to admit). INR on admit is 1.8 (goal 2.5-3.5).  Last Warfarin dose 10mg  given while inpatient on 01/20/23.   Patient transitioned to heparin drip while warfarin held for knee washout 5/13.  S/p Knee washout 5/13, per discussion with TRH and Ortho on 5/13 PM, ordered to resume warfarin on 5/13 PM  and  restart IV heparin drip at 0600 on 5/14 (12hr post-op) until INR therapeutic.  5/14  heparin drip was resumed 5/14 at 0603 6 hour HL is 0.37, therapeutic on heparin 2100 units/hr INR 1.4,  Hgb 11s-10s stable, pltc wnl stable  Goal of Therapy:  Heparin level 0.3 - 0.7  INR 2.5-3.5   Monitor platelets by anticoagulation protocol: Yes   Plan:  Warfarin 15mg  PO x 1 tonight Continue IV heparin 2100 units/hr Daily heparin level, CBC, INR Monitor for s/sx of bleeding  Thank you for allowing pharmacy to be part of this  patients care team.  Noah Delaine, RPh Clinical Pharmacist Please see amion for complete clinical pharmacist phone list 01/25/2023 3:07 PM

## 2023-01-25 NOTE — Progress Notes (Signed)
Physical Therapy Treatment Patient Details Name: Dennis Bates MRN: [id number] DOB: [birth year] Today's Date: 01/25/2023   History of Present Illness Pt is a 62 y.o. male admitted to [facility] 01/20/23 following 2-3 weeks hx R knee swelling and R knee, back, and abdominal pain. Pt presented to urgent care and found to be positive for enterococcal bacteremia. Admitted to [facility] for further evaluation. R knee total revision, irregation, and debridment on 5/13. PMH of aortic stenosis-s/p AVR (mechanical valve) in 2016, CAD s/p PCI 2016, HLD, HTN, s/p right total knee arthroplasty 2019.    PT Comments    Pt tolerated today's session well, having undergone R knee washout and revision, remaining WBAT RLE with knee immobilizer until able to perform a straight leg raise. Pt required minA for supine>sit today for trunk support and increased time for RLE management. Educated on mobility with knee immobilizer donned, minG for safety and cueing for technique. Pt ambulating well with minG for balance and safety, able to progress from step-to to step-through gait pattern with use of RW. Pt demonstrated exercises ortho MD instructed him in (heel slide and hip abduction), cueing for proper technique provided and encouraged pt to perform as tolerating. Acute PT will continue to follow pt as appropriate to progress mobility, discharge plans remain appropriate.    Recommendations for follow up therapy are one component of a multi-disciplinary discharge planning process, led by the attending physician.  Recommendations may be updated based on patient status, additional functional criteria and insurance authorization.  Follow Up Recommendations       Assistance Recommended at Discharge Intermittent Supervision/Assistance  Patient can return home with the following A little help with walking and/or transfers;Assist for transportation;Help with stairs or ramp for entrance   Equipment Recommendations  None recommended by PT     Recommendations for Other Services       Precautions / Restrictions Precautions Precautions: Fall Required Braces or Orthoses: Knee Immobilizer - Right Knee Immobilizer - Right: On when out of bed or walking;Discontinue once straight leg raise with < 10 degree lag Restrictions Weight Bearing Restrictions: Yes RLE Weight Bearing: Weight bearing as tolerated     Mobility  Bed Mobility Overal bed mobility: Needs Assistance Bed Mobility: Supine to Sit     Supine to sit: Min assist, HOB elevated     General bed mobility comments: increased time for pt management of RLE off bed, minA required for trunk support and balance scooting to EOB    Transfers Overall transfer level: Needs assistance Equipment used: Rolling walker (2 wheels) Transfers: Sit to/from Stand Sit to Stand: Min guard           General transfer comment: minG for balance and cueing for technique with BUE placement and RLE management    Ambulation/Gait Ambulation/Gait assistance: Min guard Gait Distance (Feet): 100 Feet Assistive device: Rolling walker (2 wheels) Gait Pattern/deviations: Step-to pattern, Step-through pattern, Antalgic Gait velocity: mildly decreased     General Gait Details: step-to pattern progressing to step-through pattern with progressing distance. Pt cued for RW management as pt picking up to progress initially. Cued for forward gaze and upright posture, able to self-correct during trial   Stairs             Wheelchair Mobility    Modified Rankin (Stroke Patients Only)       Balance Overall balance assessment: Needs assistance Sitting-balance support: Single extremity supported, Feet supported Sitting balance-Leahy Scale: Fair     Standing balance support: Bilateral upper extremity  supported, No upper extremity supported, Reliant on assistive device for balance Standing balance-Leahy Scale: Poor                              Cognition  Arousal/Alertness: Awake/alert Behavior During Therapy: WFL for tasks assessed/performed Overall Cognitive Status: Within Functional Limits for tasks assessed                                          Exercises      General Comments General comments (skin integrity, edema, etc.): VSS throughout      Pertinent Vitals/Pain Pain Assessment Pain Assessment: Faces Faces Pain Scale: Hurts little more Pain Location: R knee Pain Descriptors / Indicators: Discomfort Pain Intervention(s): Limited activity within patient's tolerance, Monitored during session, Premedicated before session    Home Living Family/patient expects to be discharged to:: Private residence Living Arrangements: Spouse/significant other Available Help at Discharge: Family Type of Home: House Home Access: Stairs to enter Entrance Stairs-Rails: None Entrance Stairs-Number of Steps: 3 Alternate Level Stairs-Number of Steps: Flight Home Layout: Two level Home Equipment: Shower seat;Hand held Programmer, systems (2 wheels) Additional Comments: clawfoot tub    Prior Function            PT Goals (current goals can now be found in the care plan section) Acute Rehab PT Goals Patient Stated Goal: go home PT Goal Formulation: With patient/family Time For Goal Achievement: 02/04/23 Potential to Achieve Goals: Good Progress towards PT goals: Progressing toward goals    Frequency    Min 4X/week      PT Plan Current plan remains appropriate    Co-evaluation              AM-PAC PT "6 Clicks" Mobility   Outcome Measure  Help needed turning from your back to your side while in a flat bed without using bedrails?: A Little Help needed moving from lying on your back to sitting on the side of a flat bed without using bedrails?: A Little Help needed moving to and from a bed to a chair (including a wheelchair)?: A Little Help needed standing up from a chair using your arms (e.g.,  wheelchair or bedside chair)?: A Little Help needed to walk in hospital room?: A Little Help needed climbing 3-5 steps with a railing? : A Lot 6 Click Score: 17    End of Session Equipment Utilized During Treatment: Gait belt Activity Tolerance: Patient tolerated treatment well Patient left: in chair;with call bell/phone within reach;with family/visitor present Nurse Communication: Mobility status PT Visit Diagnosis: Difficulty in walking, not elsewhere classified (R26.2);Other abnormalities of gait and mobility (R26.89)     Time: 1610-9604 PT Time Calculation (min) (ACUTE ONLY): 30 min  Charges:  $Gait Training: 8-22 mins $Therapeutic Activity: 8-22 mins                     Lindalou Hose, PT DPT Acute Rehabilitation Services Office [phone]    Leonie Man 01/25/2023, 4:05 PM

## 2023-01-25 NOTE — Plan of Care (Signed)

## 2023-01-25 NOTE — Progress Notes (Signed)
PROGRESS NOTE        PATIENT DETAILS Name: Dennis Bates Age: 62 y.o. Sex: male Date of Birth: 25-Mar-1961 Admit Date: 01/20/2023 Admitting Physician Jonah Blue, MD [id number], Elberta Fortis, MD  Brief Summary: Patient is a 62 y.o.  male with history of aortic stenosis-s/p AVR (mechanical valve) in 2016, CAD s/p PCI 2016, HLD, HTN, s/p right total knee arthroplasty 2019 who presented with 2-3 weeks history of subjective fever, abdominal pain, back pain, right knee swelling-she was seen at a local urgent care-and had blood cultures drawn which was subsequently positive, patient was then admitted to the hospitalist service for further evaluation and treatment.  Significant events: 5/8>> eval [facility]-abdominal pain-subjective fever-blood cultures drawn. 5/9>> blood cultures positive for Enterococcus-referred to the ED-admit to [facility].  Significant studies: 5/8>> CT chest/abdomen/pelvis: No consolidation/pneumothorax/effusion-no bowel obstruction.  Gallstones in the nondilated gallbladder 5/8>> CT head: Age-indeterminate bilateral cerebellar infarcts 5/9>> x-ray right knee: Total knee arthroplasty-trace joint fluid 5/9>> echo: EF 55-60%-no obvious vegetation seen on prosthetic aortic valve. 5/10>> TEE: No vegetation  Significant microbiology data: 5/8>> blood culture: Enterococcus faecalis 5/9>> synovial fluid culture: Enterococcus faecalis 5/10>> blood culture: No growth  Procedures: 5/9>> arthrocentesis by orthopedics (WBC 54,250 with 76% neutrophils-no crystals) 5/10>> TEE: No vegetation 5/13>> total right knee revision, 4 compartment synovectomy, removal of foreign body.  Consults: ID Orthopedics  Subjective: Some pain at the operative site-but no other complaints.  Objective: Vitals: Blood pressure (!) 142/86, pulse 64, temperature (!) 97.5 F (36.4 C), temperature source Oral, resp. rate 18, height 5\' 7"  (1.702 m), weight 77.3  kg, SpO2 96 %.   Exam: Gen Exam:Alert awake-not in any distress HEENT:atraumatic, normocephalic Chest: B/L clear to auscultation anteriorly CVS:S1S2 regular Abdomen:soft non tender, non distended Extremities:no edema Neurology: Non focal Skin: no rash  Pertinent Labs/Radiology:    Latest Ref Rng & Units 01/25/2023    4:44 AM 01/24/2023    3:48 AM 01/23/2023    6:02 AM  CBC  WBC 4.0 - 10.5 K/uL 8.9  6.7  5.9   Hemoglobin 13.0 - 17.0 g/dL 81.1  91.4  78.2   Hematocrit 39.0 - 52.0 % 35.7  32.9  33.4   Platelets 150 - 400 K/uL 480  397  338     Lab Results  Component Value Date   NA 134 (L) 01/25/2023   K 4.1 01/25/2023   CL 98 01/25/2023   CO2 25 01/25/2023      Assessment/Plan: 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   History of mechanical aortic valve replacement Coumadin held for orthopedic procedure on 5/13 Back on overlapping IV heparin/Coumadin.    History of CAD s/p PCI 2016 No anginal symptoms  DM-2 (A1c 7.0 on 5/6) CBG stable on SSI Resume metformin/Jardiance on discharge.  Recent Labs    01/24/23 1803 01/24/23 2134 01/25/23 0746  GLUCAP 194* 326* 173*      HTN BP stable on Coreg  HLD Statin/Lopid  Cholelithiasis Asymptomatic at this point-primary problem is enterococcal infection-he will require outpatient elective general surgery evaluation for cholecystectomy.  BMI: Estimated body mass index is 26.69 kg/m as calculated from the following:   Height as of this encounter: 5\' 7"  (1.702 m).  Weight as of this encounter: 77.3 kg.   Code status:   Code Status: Full Code   DVT Prophylaxis:IV heparin   Family Communication: None at bedside this morning   Disposition Plan: Status is: Inpatient Remains inpatient appropriate because: Severity  of illness   Planned Discharge Destination:Home with home health   Diet: Diet Order             Diet Carb Modified Fluid consistency: Thin; Room service appropriate? Yes  Diet effective now                     Antimicrobial agents: Anti-infectives (From admission, onward)    Start     Dose/Rate Route Frequency Ordered Stop   01/25/23 0600  ceFAZolin (ANCEF) IVPB 2g/100 mL premix        2 g 200 mL/hr over 30 Minutes Intravenous On call to O.R. 01/24/23 1325 01/24/23 1606   01/24/23 1649  vancomycin (VANCOCIN) powder  Status:  Discontinued          As needed 01/24/23 1650 01/24/23 1757   01/24/23 0600  ceFAZolin (ANCEF) IVPB 2g/100 mL premix  Status:  Discontinued        2 g 200 mL/hr over 30 Minutes Intravenous On call to O.R. 01/23/23 1914 01/24/23 1852   01/20/23 1100  ampicillin (OMNIPEN) 2 g in sodium chloride 0.9 % 100 mL IVPB        2 g 300 mL/hr over 20 Minutes Intravenous Every 4 hours 01/20/23 0834     01/20/23 1100  cefTRIAXone (ROCEPHIN) 2 g in sodium chloride 0.9 % 100 mL IVPB        2 g 200 mL/hr over 30 Minutes Intravenous Every 12 hours 01/20/23 0834     01/20/23 0415  piperacillin-tazobactam (ZOSYN) IVPB 3.375 g        3.375 g 100 mL/hr over 30 Minutes Intravenous  Once 01/20/23 0403 01/20/23 0500        MEDICATIONS: Scheduled Meds:  carvedilol  3.125 mg Oral BID   gemfibrozil  600 mg Oral BID AC   insulin aspart  0-15 Units Subcutaneous TID WC   insulin aspart  0-5 Units Subcutaneous QHS   multivitamin with minerals  1 tablet Oral Daily   rosuvastatin  20 mg Oral QHS   Warfarin - Pharmacist Dosing Inpatient   Does not apply q1600   Continuous Infusions:  ampicillin (OMNIPEN) IV 2 g (01/25/23 8295)   cefTRIAXone (ROCEPHIN)  IV 2 g (01/25/23 0944)   heparin 2,100 Units/hr (01/25/23 0603)   lactated ringers 75 mL/hr at 01/25/23 0600   PRN Meds:.acetaminophen **OR** acetaminophen, hydrALAZINE, morphine injection, ondansetron **OR** ondansetron  (ZOFRAN) IV, oxyCODONE   I have personally reviewed following labs and imaging studies  LABORATORY DATA: CBC: Recent Labs  Lab 01/20/23 0418 01/21/23 0546 01/22/23 0629 01/23/23 0602 01/24/23 0348 01/25/23 0444  WBC 9.5 6.7 5.8 5.9 6.7 8.9  NEUTROABS 7.5  --   --   --   --   --   HGB 11.9* 11.1* 11.2* 10.9* 10.8* 11.8*  HCT 34.3* 31.5* 33.8* 33.4* 32.9* 35.7*  MCV 84.1 82.7 86.0 84.6 86.8 86.7  PLT 234 246 299 338 397 480*     Basic Metabolic Panel: Recent Labs  Lab 01/19/23 0846 01/20/23 0418 01/21/23 0546 01/23/23 0602 01/25/23 0444  NA 132* 130* 135 136 134*  K 4.2 3.7 3.8 4.1 4.1  CL 97* 96* 101 102 98  CO2 23 21* 21* 25 25  GLUCOSE 156* 134* 121* 154* 217*  BUN 21 15 13  5* 9  CREATININE 0.90 0.87 0.97 0.77 0.83  CALCIUM 9.7 9.1 8.8* 8.7* 8.8*     GFR: Estimated Creatinine Clearance: 87.4 mL/min (by C-G formula based on SCr of 0.83 mg/dL).  Liver Function Tests: Recent Labs  Lab 01/19/23 0846 01/20/23 0418  AST 28 28  ALT 25 26  ALKPHOS 38 41  BILITOT 1.4* 1.1  PROT 7.8 6.2*  ALBUMIN 4.3 3.0*    Recent Labs  Lab 01/19/23 0846 01/20/23 0418  LIPASE 166* 115*    Recent Labs  Lab 01/19/23 0934  AMMONIA 17     Coagulation Profile: Recent Labs  Lab 01/21/23 0546 01/22/23 0629 01/23/23 0602 01/24/23 0348 01/25/23 0444  INR 2.1* 2.2* 2.1* 1.5* 1.4*     Cardiac Enzymes: Recent Labs  Lab 01/19/23 0935  CKTOTAL 104     BNP (last 3 results) No results for input(s): "PROBNP" in the last 8760 hours.  Lipid Profile: No results for input(s): "CHOL", "HDL", "LDLCALC", "TRIG", "CHOLHDL", "LDLDIRECT" in the last 72 hours.  Thyroid Function Tests: No results for input(s): "TSH", "T4TOTAL", "FREET4", "T3FREE", "THYROIDAB" in the last 72 hours.  Anemia Panel: No results for input(s): "VITAMINB12", "FOLATE", "FERRITIN", "TIBC", "IRON", "RETICCTPCT" in the last 72 hours.  Urine analysis:    Component Value Date/Time   COLORURINE  YELLOW 01/19/2023 1200   APPEARANCEUR CLEAR 01/19/2023 1200   LABSPEC >1.046 (H) 01/19/2023 1200   PHURINE 6.0 01/19/2023 1200   GLUCOSEU >1,000 (A) 01/19/2023 1200   HGBUR SMALL (A) 01/19/2023 1200   BILIRUBINUR NEGATIVE 01/19/2023 1200   KETONESUR 40 (A) 01/19/2023 1200   PROTEINUR TRACE (A) 01/19/2023 1200   NITRITE NEGATIVE 01/19/2023 1200   LEUKOCYTESUR NEGATIVE 01/19/2023 1200    Sepsis Labs: Lactic Acid, Venous    Component Value Date/Time   LATICACIDVEN 1.5 10/20/2015 2200    MICROBIOLOGY: Recent Results (from the past 240 hour(s))  Culture, blood (routine x 2)     Status: Abnormal   Collection Time: 01/19/23  9:34 AM   Specimen: BLOOD  Result Value Ref Range Status   Specimen Description   Final    BLOOD LEFT ARM Performed at [facility], [address], [place], [place] [address]    Special Requests   Final    BOTTLES DRAWN AEROBIC AND ANAEROBIC Blood Culture adequate volume Performed at [facility], [address], [place], [place] [address]    Culture  Setup Time   Final    GRAM POSITIVE COCCI IN BOTH AEROBIC AND ANAEROBIC BOTTLES CRITICAL RESULT CALLED TO, READ BACK BY AND VERIFIED WITH: KELLY GIBSON,RN@0039  01/20/23 MK Performed at [facility], [address], [place], [place] [address]    Culture ENTEROCOCCUS FAECALIS (A)  Final   Report Status 01/22/2023 FINAL  Final   Organism ID, Bacteria ENTEROCOCCUS FAECALIS  Final      Susceptibility   Enterococcus faecalis - MIC*    AMPICILLIN <=2 SENSITIVE Sensitive     VANCOMYCIN 1 SENSITIVE Sensitive     GENTAMICIN SYNERGY SENSITIVE Sensitive     * ENTEROCOCCUS FAECALIS  Blood Culture ID Panel (Reflexed)     Status: Abnormal   Collection Time: 01/19/23  9:34 AM  Result Value Ref Range Status   Enterococcus faecalis DETECTED (A) NOT DETECTED Final    Comment: CRITICAL RESULT CALLED TO, READ BACK BY AND VERIFIED WITH: KELLY GIBSON,RN@0040  01/20/23 MK     Enterococcus Faecium NOT DETECTED  NOT DETECTED Final   Listeria monocytogenes NOT DETECTED NOT DETECTED Final   Staphylococcus species NOT DETECTED NOT DETECTED Final   Staphylococcus aureus (BCID) NOT DETECTED NOT DETECTED Final   Staphylococcus epidermidis NOT DETECTED NOT DETECTED Final   Staphylococcus lugdunensis NOT DETECTED NOT DETECTED Final   Streptococcus species NOT DETECTED NOT DETECTED Final   Streptococcus agalactiae NOT DETECTED NOT DETECTED Final   Streptococcus pneumoniae NOT DETECTED NOT DETECTED Final   Streptococcus pyogenes NOT DETECTED NOT DETECTED Final   A.calcoaceticus-baumannii NOT DETECTED NOT DETECTED Final   Bacteroides fragilis NOT DETECTED NOT DETECTED Final   Enterobacterales NOT DETECTED NOT DETECTED Final   Enterobacter cloacae complex NOT DETECTED NOT DETECTED Final   Escherichia coli NOT DETECTED NOT DETECTED Final   Klebsiella aerogenes NOT DETECTED NOT DETECTED Final   Klebsiella oxytoca NOT DETECTED NOT DETECTED Final   Klebsiella pneumoniae NOT DETECTED NOT DETECTED Final   Proteus species NOT DETECTED NOT DETECTED Final   Salmonella species NOT DETECTED NOT DETECTED Final   Serratia marcescens NOT DETECTED NOT DETECTED Final   Haemophilus influenzae NOT DETECTED NOT DETECTED Final   Neisseria meningitidis NOT DETECTED NOT DETECTED Final   Pseudomonas aeruginosa NOT DETECTED NOT DETECTED Final   Stenotrophomonas maltophilia NOT DETECTED NOT DETECTED Final   Candida albicans NOT DETECTED NOT DETECTED Final   Candida auris NOT DETECTED NOT DETECTED Final   Candida glabrata NOT DETECTED NOT DETECTED Final   Candida krusei NOT DETECTED NOT DETECTED Final   Candida parapsilosis NOT DETECTED NOT DETECTED Final   Candida tropicalis NOT DETECTED NOT DETECTED Final   Cryptococcus neoformans/gattii NOT DETECTED NOT DETECTED Final   Vancomycin resistance NOT DETECTED NOT DETECTED Final    Comment: Performed at [facility], [address]  [place], [place] [address]  Culture, blood (routine x 2)     Status: Abnormal   Collection Time: 01/19/23 10:15 AM   Specimen: BLOOD  Result Value Ref Range Status   Specimen Description   Final    BLOOD BLOOD RIGHT FOREARM Performed at [facility], [address] [place], [place] [address]    Special Requests   Final    BOTTLES DRAWN AEROBIC AND ANAEROBIC Blood Culture adequate volume Performed at [facility], [address] [place], [place] [address]    Culture  Setup Time   Final    GRAM POSITIVE COCCI IN CHAINS IN BOTH AEROBIC AND ANAEROBIC BOTTLES CRITICAL VALUE NOTED.  VALUE IS CONSISTENT WITH PREVIOUSLY REPORTED AND CALLED VALUE.    Culture (A)  Final    ENTEROCOCCUS FAECALIS SUSCEPTIBILITIES PERFORMED ON PREVIOUS CULTURE WITHIN THE LAST 5 DAYS. Performed at [facility], [address] [place], [place] [address]    Report Status 01/22/2023 FINAL  Final  Resp panel by RT-PCR (RSV, Flu A&B, Covid) Anterior Nasal Swab     Status: None   Collection Time: 01/19/23 12:08 PM   Specimen: Anterior Nasal Swab  Result Value Ref Range Status   SARS Coronavirus 2 by RT PCR NEGATIVE NEGATIVE Final    Comment: (NOTE) SARS-CoV-2 target nucleic acids are NOT DETECTED.  The SARS-CoV-2 RNA is generally detectable in upper respiratory specimens during the acute phase of infection. The lowest concentration of SARS-CoV-2 viral copies this assay can detect is 138 copies/mL. A negative result does not preclude SARS-Cov-2 infection and should not be used as the sole basis for treatment or other patient management decisions. A negative result may occur with  improper  specimen collection/handling, submission of specimen other than nasopharyngeal swab, presence of viral mutation(s) within the areas targeted by this assay, and inadequate number of viral copies(<138 copies/mL). A negative result must be combined with clinical observations, patient  history, and epidemiological information. The expected result is Negative.  Fact Sheet for Patients:  BloggerCourse.com  Fact Sheet for Healthcare Providers:  SeriousBroker.it  This test is no t yet approved or cleared by the [place] FDA and  has been authorized for detection and/or diagnosis of SARS-CoV-2 by FDA under an Emergency Use Authorization (EUA). This EUA will remain  in effect (meaning this test can be used) for the duration of the COVID-19 declaration under Section 564(b)(1) of the Act, 21 U.S.C.section 360bbb-3(b)(1), unless the authorization is terminated  or revoked sooner.       Influenza A by PCR NEGATIVE NEGATIVE Final   Influenza B by PCR NEGATIVE NEGATIVE Final    Comment: (NOTE) The Xpert Xpress SARS-CoV-2/FLU/RSV plus assay is intended as an aid in the diagnosis of influenza from Nasopharyngeal swab specimens and should not be used as a sole basis for treatment. Nasal washings and aspirates are unacceptable for Xpert Xpress SARS-CoV-2/FLU/RSV testing.  Fact Sheet for Patients: BloggerCourse.com  Fact Sheet for Healthcare Providers: SeriousBroker.it  This test is not yet approved or cleared by the [place] FDA and has been authorized for detection and/or diagnosis of SARS-CoV-2 by FDA under an Emergency Use Authorization (EUA). This EUA will remain in effect (meaning this test can be used) for the duration of the COVID-19 declaration under Section 564(b)(1) of the Act, 21 U.S.C. section 360bbb-3(b)(1), unless the authorization is terminated or revoked.     Resp Syncytial Virus by PCR NEGATIVE NEGATIVE Final    Comment: (NOTE) Fact Sheet for Patients: BloggerCourse.com  Fact Sheet for Healthcare Providers: SeriousBroker.it  This test is not yet approved or cleared by the [place] FDA  and has been authorized for detection and/or diagnosis of SARS-CoV-2 by FDA under an Emergency Use Authorization (EUA). This EUA will remain in effect (meaning this test can be used) for the duration of the COVID-19 declaration under Section 564(b)(1) of the Act, 21 U.S.C. section 360bbb-3(b)(1), unless the authorization is terminated or revoked.  Performed at [facility], [address] [place], [place] [address]   Body fluid culture w Gram Stain     Status: None   Collection Time: 01/20/23  7:03 PM   Specimen: Body Fluid  Result Value Ref Range Status   Specimen Description FLUID  Final   Special Requests KNEE  Final   Gram Stain   Final    ABUNDANT WBC PRESENT, PREDOMINANTLY PMN NO ORGANISMS SEEN    Culture   Final    FEW ENTEROCOCCUS FAECALIS CRITICAL RESULT CALLED TO, READ BACK BY AND VERIFIED WITH: RN Oren Beckmann [phone] FCP Performed at [facility], [address], [place], [place] [address]    Report Status 01/23/2023 FINAL  Final   Organism ID, Bacteria ENTEROCOCCUS FAECALIS  Final      Susceptibility   Enterococcus faecalis - MIC*    AMPICILLIN <=2 SENSITIVE Sensitive     VANCOMYCIN 1 SENSITIVE Sensitive     GENTAMICIN SYNERGY SENSITIVE Sensitive     * FEW ENTEROCOCCUS FAECALIS  Culture, blood (Routine X 2) w Reflex to ID Panel     Status: None (Preliminary result)   Collection Time: 01/21/23  5:46 AM   Specimen: BLOOD RIGHT ARM  Result Value Ref  Range Status   Specimen Description BLOOD RIGHT ARM  Final   Special Requests   Final    BOTTLES DRAWN AEROBIC AND ANAEROBIC Blood Culture results may not be optimal due to an excessive volume of blood received in culture bottles   Culture   Final    NO GROWTH 4 DAYS Performed at [facility], [address] [place], [place] [address]    Report Status PENDING  Incomplete  Culture, blood (Routine X 2) w Reflex to ID Panel     Status: None (Preliminary result)   Collection Time:  01/21/23  5:47 AM   Specimen: BLOOD LEFT HAND  Result Value Ref Range Status   Specimen Description BLOOD LEFT HAND  Final   Special Requests   Final    BOTTLES DRAWN AEROBIC AND ANAEROBIC Blood Culture adequate volume   Culture   Final    NO GROWTH 4 DAYS Performed at [facility], [address] [place], [place] [address]    Report Status PENDING  Incomplete  MRSA Next Gen by PCR, Nasal     Status: None   Collection Time: 01/23/23 10:36 PM   Specimen: Nasal Mucosa; Nasal Swab  Result Value Ref Range Status   MRSA by PCR Next Gen NOT DETECTED NOT DETECTED Final    Comment: (NOTE) The GeneXpert MRSA Assay (FDA approved for NASAL specimens only), is one component of a comprehensive MRSA colonization surveillance program. It is not intended to diagnose MRSA infection nor to guide or monitor treatment for MRSA infections. Test performance is not FDA approved in patients less than 30 years old. Performed at [facility], [address], [place], [place] [address]   Aerobic/Anaerobic Culture w Gram Stain (surgical/deep wound)     Status: None (Preliminary result)   Collection Time: 01/24/23  4:27 PM   Specimen: Wound; Body Fluid  Result Value Ref Range Status   Specimen Description FLUID RIGHT KNEE SYNOVIAL  Final   Special Requests SWAB PT ON ANCEF  Final   Gram Stain   Final    FEW WBC PRESENT, PREDOMINANTLY PMN NO ORGANISMS SEEN Performed at [facility], [address] [place], [place] [address]    Culture PENDING  Incomplete   Report Status PENDING  Incomplete    RADIOLOGY STUDIES/RESULTS: No results found.   LOS: 5 days   Jeoffrey Massed, MD  [facility]    To contact the attending provider between 7A-7P or the covering provider during after hours 7P-7A, please log into the web site [email or web address] and access using universal [place] password for that web site. If you do not have the password, please call the hospital  operator.  01/25/2023, 10:25 AM

## 2023-01-25 NOTE — Progress Notes (Signed)
[facility]  Date of Admission:  01/20/2023      Total days of antibiotics 7   Ampicillin 5/09 >> c  Ceftriaxone 5/09 >> c          ASSESSMENT: Dennis Bates is a 62 y.o. male admitted with enterococcal faecalis bacteremia in the setting of worsening right knee pain involving his total joint replacement.    Enterococcal Bacteremia -  Possible this is translocation of GI bacteria from gallstone but not possible to know for sure. Hematogenous seeding of right total knee now POD1 DAIR. Continue treatment for 6 week course of IV induction followed by suppressive amoxicillin thereafter. Blood cultures (+) 5/8 and negative preliminarily since 5/09. 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 Cloudy fluid described in op note along with foreign material from previous surgeries. Tracking of suture down to metaphyseal bone.  Will need 6 weeks IV abx then oral abx to complete 6 months minimum.   AVR -  TEE read as negative.  Given the risk here with artificial valve, would prefer to give dual beta-lactam therapy induction as opposed to ampicillin alone.    PLAN: PICC to be placed 5/15 if blood cultures finalize no growth ---> double lumen will be needed Post op care per Dr. Luiz Blare  OPAT as outlined below.   OPAT ORDERS:  Diagnosis: Rt Knee PJI with bacteremia and high degree of concern for PVE  Culture Result: enterococus faecalis   Allergies: none    Discharge antibiotics to be given via PICC line:  AMPICILLIN 12,000 mg continuous infusion   + Ceftriaxone 2 gm IV BID    Duration: 6 weeks   End Date: 6/24  [facility] Care Per Protocol with Biopatch Use: Home health RN for IV administration and teaching, line care and labs.    Labs weekly while on IV antibiotics: _x_ CBC with differential __ BMP **TWICE WEEKLY ON VANCOMYCIN   _x_ CMP _x_ CRP __ ESR __ Vancomycin trough TWICE WEEKLY __ CK  _x_ Please pull PIC at completion of IV antibiotics __ Please leave PIC in place until doctor has seen patient or been notified  Fax weekly labs to [phone]  Clinic Follow Up Appt:   Principal Problem:   Enterococcal bacteremia Active Problems:   Type 2 diabetes mellitus with hyperglycemia (HCC)   Gallstones   Prosthetic valve endocarditis (HCC)   Chronic infection of prosthetic knee (HCC)   Anticoagulated on warfarin   Essential hypertension   Dyslipidemia   Lymphadenopathy    carvedilol  3.125 mg Oral BID   gemfibrozil  600 mg Oral BID AC   insulin aspart  0-15 Units Subcutaneous TID WC   insulin aspart  0-5 Units Subcutaneous QHS   multivitamin with minerals  1 tablet Oral Daily   rosuvastatin  20 mg Oral QHS   Warfarin - Pharmacist Dosing Inpatient   Does not apply q1600    SUBJECTIVE: Doing well after his surgery yesterday. Hemovac drain in place x 1. Given clearance to do some movement exercises. No PICC yet.    Review of Systems: Review of Systems  Constitutional:  Negative for chills, diaphoresis and fever.  Musculoskeletal:  Positive for joint pain (soreness from surgery).      OBJECTIVE: Vitals:   01/25/23 0414 01/25/23 0743 01/25/23 1000  01/25/23 1127  BP: 124/76 (!) 142/86  129/75  Pulse: 64     Resp: 18 18  18   Temp: (!) 97.5 F (36.4 C)  98.4 F (36.9 C) 98.3 F (36.8 C)  TempSrc: Oral  Oral Oral  SpO2: 96%     Weight:      Height:       Body mass index is 26.69 kg/m.  Physical Exam Vitals reviewed.     Lab Results Lab Results  Component Value Date   WBC 8.9 01/25/2023   HGB 11.8 (L) 01/25/2023   HCT 35.7 (L) 01/25/2023   MCV 86.7 01/25/2023   PLT 480 (H) 01/25/2023    Lab Results  Component Value Date   CREATININE 0.83 01/25/2023   BUN 9 01/25/2023   NA 134 (L) 01/25/2023   K 4.1 01/25/2023   CL 98 01/25/2023   CO2 25 01/25/2023    Lab Results   Component Value Date   ALT 26 01/20/2023   AST 28 01/20/2023   ALKPHOS 41 01/20/2023   BILITOT 1.1 01/20/2023     Microbiology: Recent Results (from the past 240 hour(s))  Culture, blood (routine x 2)     Status: Abnormal   Collection Time: 01/19/23  9:34 AM   Specimen: BLOOD  Result Value Ref Range Status   Specimen Description   Final    BLOOD LEFT ARM Performed at [facility], [address] [place], [place] [address]    Special Requests   Final    BOTTLES DRAWN AEROBIC AND ANAEROBIC Blood Culture adequate volume Performed at [facility], [address] [place], [place] [address]    Culture  Setup Time   Final    GRAM POSITIVE COCCI IN BOTH AEROBIC AND ANAEROBIC BOTTLES CRITICAL RESULT CALLED TO, READ BACK BY AND VERIFIED WITH: KELLY GIBSON,RN@0039  01/20/23 MK Performed at [facility], [address] [place], [place] [address]    Culture ENTEROCOCCUS FAECALIS (A)  Final   Report Status 01/22/2023 FINAL  Final   Organism ID, Bacteria ENTEROCOCCUS FAECALIS  Final      Susceptibility   Enterococcus faecalis - MIC*    AMPICILLIN <=2 SENSITIVE Sensitive     VANCOMYCIN 1 SENSITIVE Sensitive     GENTAMICIN SYNERGY SENSITIVE Sensitive     * ENTEROCOCCUS FAECALIS  Blood Culture ID Panel (Reflexed)     Status: Abnormal   Collection Time: 01/19/23  9:34 AM  Result Value Ref Range Status   Enterococcus faecalis DETECTED (A) NOT DETECTED Final    Comment: CRITICAL RESULT CALLED TO, READ BACK BY AND VERIFIED WITH: KELLY GIBSON,RN@0040  01/20/23 MK    Enterococcus Faecium NOT DETECTED NOT DETECTED Final   Listeria monocytogenes NOT DETECTED NOT DETECTED Final   Staphylococcus species NOT DETECTED NOT DETECTED Final   Staphylococcus aureus (BCID) NOT DETECTED NOT DETECTED Final   Staphylococcus epidermidis NOT DETECTED NOT DETECTED Final   Staphylococcus lugdunensis NOT DETECTED NOT DETECTED Final   Streptococcus species NOT  DETECTED NOT DETECTED Final   Streptococcus agalactiae NOT DETECTED NOT DETECTED Final   Streptococcus pneumoniae NOT DETECTED NOT DETECTED Final   Streptococcus pyogenes NOT DETECTED NOT DETECTED Final   A.calcoaceticus-baumannii NOT DETECTED NOT DETECTED Final   Bacteroides fragilis NOT DETECTED NOT DETECTED Final   Enterobacterales NOT DETECTED NOT DETECTED Final   Enterobacter cloacae complex NOT DETECTED NOT DETECTED Final   Escherichia coli NOT DETECTED NOT DETECTED Final   Klebsiella aerogenes NOT DETECTED NOT DETECTED Final  Klebsiella oxytoca NOT DETECTED NOT DETECTED Final   Klebsiella pneumoniae NOT DETECTED NOT DETECTED Final   Proteus species NOT DETECTED NOT DETECTED Final   Salmonella species NOT DETECTED NOT DETECTED Final   Serratia marcescens NOT DETECTED NOT DETECTED Final   Haemophilus influenzae NOT DETECTED NOT DETECTED Final   Neisseria meningitidis NOT DETECTED NOT DETECTED Final   Pseudomonas aeruginosa NOT DETECTED NOT DETECTED Final   Stenotrophomonas maltophilia NOT DETECTED NOT DETECTED Final   Candida albicans NOT DETECTED NOT DETECTED Final   Candida auris NOT DETECTED NOT DETECTED Final   Candida glabrata NOT DETECTED NOT DETECTED Final   Candida krusei NOT DETECTED NOT DETECTED Final   Candida parapsilosis NOT DETECTED NOT DETECTED Final   Candida tropicalis NOT DETECTED NOT DETECTED Final   Cryptococcus neoformans/gattii NOT DETECTED NOT DETECTED Final   Vancomycin resistance NOT DETECTED NOT DETECTED Final    Comment: Performed at [facility], [address], [place], [place] [address]  Culture, blood (routine x 2)     Status: Abnormal   Collection Time: 01/19/23 10:15 AM   Specimen: BLOOD  Result Value Ref Range Status   Specimen Description   Final    BLOOD BLOOD RIGHT FOREARM Performed at [facility], [address], [place], [place] [address]    Special Requests   Final    BOTTLES DRAWN AEROBIC AND ANAEROBIC  Blood Culture adequate volume Performed at [facility], [address] [place], [place] [address]    Culture  Setup Time   Final    GRAM POSITIVE COCCI IN CHAINS IN BOTH AEROBIC AND ANAEROBIC BOTTLES CRITICAL VALUE NOTED.  VALUE IS CONSISTENT WITH PREVIOUSLY REPORTED AND CALLED VALUE.    Culture (A)  Final    ENTEROCOCCUS FAECALIS SUSCEPTIBILITIES PERFORMED ON PREVIOUS CULTURE WITHIN THE LAST 5 DAYS. Performed at [facility], [address] [place], [place] [address]    Report Status 01/22/2023 FINAL  Final  Resp panel by RT-PCR (RSV, Flu A&B, Covid) Anterior Nasal Swab     Status: None   Collection Time: 01/19/23 12:08 PM   Specimen: Anterior Nasal Swab  Result Value Ref Range Status   SARS Coronavirus 2 by RT PCR NEGATIVE NEGATIVE Final    Comment: (NOTE) SARS-CoV-2 target nucleic acids are NOT DETECTED.  The SARS-CoV-2 RNA is generally detectable in upper respiratory specimens during the acute phase of infection. The lowest concentration of SARS-CoV-2 viral copies this assay can detect is 138 copies/mL. A negative result does not preclude SARS-Cov-2 infection and should not be used as the sole basis for treatment or other patient management decisions. A negative result may occur with  improper specimen collection/handling, submission of specimen other than nasopharyngeal swab, presence of viral mutation(s) within the areas targeted by this assay, and inadequate number of viral copies(<138 copies/mL). A negative result must be combined with clinical observations, patient history, and epidemiological information. The expected result is Negative.  Fact Sheet for Patients:  BloggerCourse.com  Fact Sheet for Healthcare Providers:  SeriousBroker.it  This test is no t yet approved or cleared by the [place] FDA and  has been authorized for detection and/or diagnosis of SARS-CoV-2 by FDA under an  Emergency Use Authorization (EUA). This EUA will remain  in effect (meaning this test can be used) for the duration of the COVID-19 declaration under Section 564(b)(1) of the Act, 21 U.S.C.section 360bbb-3(b)(1), unless the authorization is terminated  or revoked sooner.  Influenza A by PCR NEGATIVE NEGATIVE Final   Influenza B by PCR NEGATIVE NEGATIVE Final    Comment: (NOTE) The Xpert Xpress SARS-CoV-2/FLU/RSV plus assay is intended as an aid in the diagnosis of influenza from Nasopharyngeal swab specimens and should not be used as a sole basis for treatment. Nasal washings and aspirates are unacceptable for Xpert Xpress SARS-CoV-2/FLU/RSV testing.  Fact Sheet for Patients: BloggerCourse.com  Fact Sheet for Healthcare Providers: SeriousBroker.it  This test is not yet approved or cleared by the [place] FDA and has been authorized for detection and/or diagnosis of SARS-CoV-2 by FDA under an Emergency Use Authorization (EUA). This EUA will remain in effect (meaning this test can be used) for the duration of the COVID-19 declaration under Section 564(b)(1) of the Act, 21 U.S.C. section 360bbb-3(b)(1), unless the authorization is terminated or revoked.     Resp Syncytial Virus by PCR NEGATIVE NEGATIVE Final    Comment: (NOTE) Fact Sheet for Patients: BloggerCourse.com  Fact Sheet for Healthcare Providers: SeriousBroker.it  This test is not yet approved or cleared by the [place] FDA and has been authorized for detection and/or diagnosis of SARS-CoV-2 by FDA under an Emergency Use Authorization (EUA). This EUA will remain in effect (meaning this test can be used) for the duration of the COVID-19 declaration under Section 564(b)(1) of the Act, 21 U.S.C. section 360bbb-3(b)(1), unless the authorization is terminated or revoked.  Performed at [facility], [address] [place], [place] [address]   Body fluid culture w Gram Stain     Status: None   Collection Time: 01/20/23  7:03 PM   Specimen: Body Fluid  Result Value Ref Range Status   Specimen Description FLUID  Final   Special Requests KNEE  Final   Gram Stain   Final    ABUNDANT WBC PRESENT, PREDOMINANTLY PMN NO ORGANISMS SEEN    Culture   Final    FEW ENTEROCOCCUS FAECALIS CRITICAL RESULT CALLED TO, READ BACK BY AND VERIFIED WITH: RN Oren Beckmann [phone] FCP Performed at [facility], [address] [place], [place] [address]    Report Status 01/23/2023 FINAL  Final   Organism ID, Bacteria ENTEROCOCCUS FAECALIS  Final      Susceptibility   Enterococcus faecalis - MIC*    AMPICILLIN <=2 SENSITIVE Sensitive     VANCOMYCIN 1 SENSITIVE Sensitive     GENTAMICIN SYNERGY SENSITIVE Sensitive     * FEW ENTEROCOCCUS FAECALIS  Culture, blood (Routine X 2) w Reflex to ID Panel     Status: None (Preliminary result)   Collection Time: 01/21/23  5:46 AM   Specimen: BLOOD RIGHT ARM  Result Value Ref Range Status   Specimen Description BLOOD RIGHT ARM  Final   Special Requests   Final    BOTTLES DRAWN AEROBIC AND ANAEROBIC Blood Culture results may not be optimal due to an excessive volume of blood received in culture bottles   Culture   Final    NO GROWTH 4 DAYS Performed at [facility], [address] [place], [place] [address]    Report Status PENDING  Incomplete  Culture, blood (Routine X 2) w Reflex to ID Panel     Status: None (Preliminary result)   Collection Time: 01/21/23  5:47 AM   Specimen: BLOOD LEFT HAND  Result Value Ref Range Status   Specimen Description BLOOD LEFT HAND  Final   Special Requests   Final    BOTTLES DRAWN AEROBIC AND  ANAEROBIC Blood Culture adequate volume   Culture   Final    NO GROWTH 4 DAYS Performed at [facility], [address], [place], [place] [address]    Report Status PENDING  Incomplete  MRSA Next  Gen by PCR, Nasal     Status: None   Collection Time: 01/23/23 10:36 PM   Specimen: Nasal Mucosa; Nasal Swab  Result Value Ref Range Status   MRSA by PCR Next Gen NOT DETECTED NOT DETECTED Final    Comment: (NOTE) The GeneXpert MRSA Assay (FDA approved for NASAL specimens only), is one component of a comprehensive MRSA colonization surveillance program. It is not intended to diagnose MRSA infection nor to guide or monitor treatment for MRSA infections. Test performance is not FDA approved in patients less than 28 years old. Performed at [facility], [address] [place], [place] [address]   Aerobic/Anaerobic Culture w Gram Stain (surgical/deep wound)     Status: None (Preliminary result)   Collection Time: 01/24/23  4:27 PM   Specimen: Wound; Body Fluid  Result Value Ref Range Status   Specimen Description FLUID RIGHT KNEE SYNOVIAL  Final   Special Requests SWAB PT ON ANCEF  Final   Gram Stain   Final    FEW WBC PRESENT, PREDOMINANTLY PMN NO ORGANISMS SEEN    Culture   Final    NO GROWTH < 24 HOURS Performed at [facility], [address], [place], [place] [address]    Report Status PENDING  Incomplete     Rexene Alberts, MSN, NP-C [facility]  [email or web address] Pager: [phone] Office: [phone] [facility] Main Line: [phone] *Secure Chat Communication Welcome   Total Encounter Time: 30 m

## 2023-01-25 NOTE — Evaluation (Signed)
Occupational Therapy Evaluation Patient Details Name: Dennis Bates MRN: [id number] DOB: 1961-01-05 Today's Date: 01/25/2023   History of Present Illness Pt is a 62 y.o. male admitted to [facility] 01/20/23 following 2-3 weeks hx R knee swelling and R knee, back, and abdominal pain. Pt presented to urgent care and found to be positive for enterococcal bacteremia. Admitted to [facility] for further evaluation. R knee total revision, irregation, and debridment on 5/13. PMH of aortic stenosis-s/p AVR (mechanical valve) in 2016, CAD s/p PCI 2016, HLD, HTN, s/p right total knee arthroplasty 2019.   Clinical Impression   At baseline PLOF, pt is Independent with ADLs, IADLs, and functional transfers/mobility. Pt drives and works outside the home. Following R total knee revision, irrigation, and debridement on 01/24/23, pt presents with decreased balance, decreased activity tolerance, and decreased safety and independence with ADLs, functional transfers, and functional mobility. Pt currently demonstrates ability to complete UB ADLs Independently in sitting, LB ADLs with Min to Mod assist, and functional transfers/mobility with a RW (2 wheel) with Min guard to Min assist. Pt will benefit from acute skilled OT to increase safety and independence with functional transfers/mobilities and LB ADLs with training in use of AE as needed. Post acute discharge, pt will benefit from continued skilled OT services in the home to address safety and independence with tub/shower transfers and for further assessment of home equipment needs as pt's home has claw foot tubs.      Recommendations for follow up therapy are one component of a multi-disciplinary discharge planning process, led by the attending physician.  Recommendations may be updated based on patient status, additional functional criteria and insurance authorization.   Assistance Recommended at Discharge Intermittent Supervision/Assistance  Patient can return home with the  following A little help with walking and/or transfers;A lot of help with bathing/dressing/bathroom;Assistance with cooking/housework;Assist for transportation;Help with stairs or ramp for entrance    Functional Status Assessment  Patient has had a recent decline in their functional status and demonstrates the ability to make significant improvements in function in a reasonable and predictable amount of time.  Equipment Recommendations  Other (comment) (Tub transfer bench with leg extenders)    Recommendations for Other Services       Precautions / Restrictions Precautions Precautions: Fall Required Braces or Orthoses: Knee Immobilizer - Right Restrictions Weight Bearing Restrictions: Yes RLE Weight Bearing: Weight bearing as tolerated      Mobility Bed Mobility Overal bed mobility: Needs Assistance Bed Mobility: Supine to Sit     Supine to sit: Min guard          Transfers Overall transfer level: Needs assistance Equipment used: Rolling walker (2 wheels) Transfers: Sit to/from Stand, Bed to chair/wheelchair/BSC Sit to Stand: Min guard     Step pivot transfers: Min guard     General transfer comment: with cues for technique      Balance Overall balance assessment: Needs assistance Sitting-balance support: No upper extremity supported, Single extremity supported, Feet supported Sitting balance-Leahy Scale: Good     Standing balance support: Bilateral upper extremity supported, During functional activity Standing balance-Leahy Scale: Good                             ADL either performed or assessed with clinical judgement   ADL Overall ADL's : Needs assistance/impaired Eating/Feeding: Independent   Grooming: Independent;Sitting   Upper Body Bathing: Independent;Sitting   Lower Body Bathing: Moderate assistance;Sitting/lateral leans;Cueing for compensatory techniques  Upper Body Dressing : Independent   Lower Body Dressing: Moderate  assistance;Cueing for compensatory techniques;Sitting/lateral leans   Toilet Transfer: Minimal assistance;Rolling walker (2 wheels);BSC/3in1;Ambulation   Toileting- Clothing Manipulation and Hygiene: Moderate assistance;Cueing for compensatory techniques;Sit to/from stand       Functional mobility during ADLs: Min guard;Minimal assistance;Rolling walker (2 wheels) (Min guard to Min assist) General ADL Comments: Post precedure on 5/13, pt presents with decreased safety and independence with LB ADLs.     Vision Baseline Vision/History: 1 Wears glasses Ability to See in Adequate Light: 0 Adequate Patient Visual Report: No change from baseline       Perception     Praxis      Pertinent Vitals/Pain Pain Assessment Pain Assessment: Faces Faces Pain Scale: Hurts whole lot Pain Location: R knee Pain Descriptors / Indicators: Discomfort, Grimacing, Guarding Pain Intervention(s): Limited activity within patient's tolerance, Monitored during session, RN gave pain meds during session     Hand Dominance Right   Extremity/Trunk Assessment Upper Extremity Assessment Upper Extremity Assessment: Overall WFL for tasks assessed   Lower Extremity Assessment Lower Extremity Assessment: Defer to PT evaluation   Cervical / Trunk Assessment Cervical / Trunk Assessment: Normal   Communication Communication Communication: No difficulties   Cognition Arousal/Alertness: Awake/alert Behavior During Therapy: WFL for tasks assessed/performed Overall Cognitive Status: Within Functional Limits for tasks assessed                                 General Comments: A&Ox4, pleasant during session     General Comments  VSS throughout session on RA.    Exercises     Shoulder Instructions      Home Living Family/patient expects to be discharged to:: Private residence Living Arrangements: Spouse/significant other Available Help at Discharge: Family Type of Home: House Home  Access: Stairs to enter Secretary/administrator of Steps: 3 Entrance Stairs-Rails: None Home Layout: Two level Alternate Level Stairs-Number of Steps: Flight Alternate Level Stairs-Rails: Right Bathroom Shower/Tub: Chief Strategy Officer: Standard     Home Equipment: Shower seat;Hand held Programmer, systems (2 wheels)   Additional Comments: clawfoot tub      Prior Functioning/Environment Prior Level of Function : Independent/Modified Independent             Mobility Comments: independent prior to admission ADLs Comments: Preveiously Independent, driving        OT Problem List: Decreased activity tolerance;Impaired balance (sitting and/or standing);Decreased knowledge of use of DME or AE      OT Treatment/Interventions: Self-care/ADL training;DME and/or AE instruction;Therapeutic activities;Patient/family education;Balance training    OT Goals(Current goals can be found in the care plan section) Acute Rehab OT Goals Patient Stated Goal: To return to being Independent with ADLs. OT Goal Formulation: With patient Time For Goal Achievement: 02/08/23 Potential to Achieve Goals: Good ADL Goals Pt Will Perform Grooming: standing;with modified independence Pt Will Perform Lower Body Bathing: with supervision;sit to/from stand (with use of adaptive equipment as needed) Pt Will Perform Lower Body Dressing: sit to/from stand;with modified independence (with use of adaptive equipment as needed) Pt Will Transfer to Toilet: ambulating;regular height toilet;grab bars;with modified independence (with least restrictive AD) Pt Will Perform Toileting - Clothing Manipulation and hygiene: sit to/from stand;with modified independence (with least restrictive AD)  OT Frequency: Min 2X/week    Co-evaluation              AM-PAC OT "6 Clicks"  Daily Activity     Outcome Measure Help from another person eating meals?: None Help from another person taking care of  personal grooming?: None (in sitting) Help from another person toileting, which includes using toliet, bedpan, or urinal?: A Lot Help from another person bathing (including washing, rinsing, drying)?: A Lot Help from another person to put on and taking off regular upper body clothing?: None Help from another person to put on and taking off regular lower body clothing?: A Lot 6 Click Score: 18   End of Session Equipment Utilized During Treatment: Gait belt;Rolling walker (2 wheels);Right knee immobilizer Nurse Communication: Mobility status  Activity Tolerance: Patient tolerated treatment well Patient left: in bed;with call bell/phone within reach;with bed alarm set  OT Visit Diagnosis: Unsteadiness on feet (R26.81)                Time: 8119-1478 OT Time Calculation (min): 26 min Charges:  OT General Charges $OT Visit: 1 Visit OT Evaluation $OT Eval Low Complexity: 1 Low  Samay Delcarlo "Orson Eva., OTR/L, MA Acute Rehab [phone]   Lendon Colonel 01/25/2023, 2:08 PM

## 2023-01-25 NOTE — Progress Notes (Signed)
Subjective: 1 Day Post-Op Procedure(s) (LRB): TOTAL KNEE REVISION (Right) IRRIGATION AND DEBRIDEMENT KNEE (Right) Patient continues to have some pain but not severe. Patient reports pain as mild.    Objective: Vital signs in last 24 hours: Temp:  [97.5 F (36.4 C)-98.4 F (36.9 C)] 97.5 F (36.4 C) (05/14 0414) Pulse Rate:  [64-71] 64 (05/14 0414) Resp:  [10-18] 18 (05/14 0743) BP: (124-148)/(76-86) 142/86 (05/14 0743) SpO2:  [93 %-99 %] 96 % (05/14 0414)  Intake/Output from previous day: 05/13 0701 - 05/14 0700 In: 6406.3 [P.O.:720; I.V.:3756.4; IV Piggyback:1929.9] Out: 1600 [Urine:1550; Blood:50] Intake/Output this shift: No intake/output data recorded.  Recent Labs    01/23/23 0602 01/24/23 0348 01/25/23 0444  HGB 10.9* 10.8* 11.8*   Recent Labs    01/24/23 0348 01/25/23 0444  WBC 6.7 8.9  RBC 3.79* 4.12*  HCT 32.9* 35.7*  PLT 397 480*   Recent Labs    01/23/23 0602 01/25/23 0444  NA 136 134*  K 4.1 4.1  CL 102 98  CO2 25 25  BUN 5* 9  CREATININE 0.77 0.83  GLUCOSE 154* 217*  CALCIUM 8.7* 8.8*   Recent Labs    01/24/23 0348 01/25/23 0444  INR 1.5* 1.4*    Neurologically intact ABD soft Neurovascular intact Sensation intact distally Intact pulses distally No cellulitis present Compartment soft   Assessment/Plan: 1 Day Post-Op Procedure(s) (LRB): TOTAL KNEE REVISION (Right) IRRIGATION AND DEBRIDEMENT KNEE (Right) Advance diet Up with therapy   We will start CPM on the patient today.  Findings at the time of surgery showed that there was significant cloudy fluid within the knee.  There was some foreign material left over from previous surgeries.  There was a tracking of suture down into the metaphyseal bone and I overdrilled the metaphyseal bone as there was a hole there.  We did put Betadine and peroxide swabs through this hole.  We also put vancomycin powder within this hole and also within the knee.  Patient will obviously need the  full complement of antibiotic therapy.  We will continue to follow him in the hospital and DC drains once the drainage has minimized.   Harvie Junior 01/25/2023, 8:20 AM

## 2023-01-26 ENCOUNTER — Other Ambulatory Visit: Payer: Self-pay

## 2023-01-26 ENCOUNTER — Other Ambulatory Visit (HOSPITAL_BASED_OUTPATIENT_CLINIC_OR_DEPARTMENT_OTHER): Payer: Self-pay

## 2023-01-26 DIAGNOSIS — I10 Essential (primary) hypertension: Secondary | ICD-10-CM | POA: Diagnosis not present

## 2023-01-26 DIAGNOSIS — T826XXD Infection and inflammatory reaction due to cardiac valve prosthesis, subsequent encounter: Secondary | ICD-10-CM | POA: Diagnosis not present

## 2023-01-26 DIAGNOSIS — T8459XS Infection and inflammatory reaction due to other internal joint prosthesis, sequela: Secondary | ICD-10-CM | POA: Diagnosis not present

## 2023-01-26 DIAGNOSIS — R7881 Bacteremia: Secondary | ICD-10-CM | POA: Diagnosis not present

## 2023-01-26 LAB — CBC
HCT: 32.6 % — ABNORMAL LOW (ref 39.0–52.0)
Hemoglobin: 10.7 g/dL — ABNORMAL LOW (ref 13.0–17.0)
MCH: 28.7 pg (ref 26.0–34.0)
MCHC: 32.8 g/dL (ref 30.0–36.0)
MCV: 87.4 fL (ref 80.0–100.0)
Platelets: 544 10*3/uL — ABNORMAL HIGH (ref 150–400)
RBC: 3.73 MIL/uL — ABNORMAL LOW (ref 4.22–5.81)
RDW: 13.2 % (ref 11.5–15.5)
WBC: 8.2 10*3/uL (ref 4.0–10.5)
nRBC: 0 % (ref 0.0–0.2)

## 2023-01-26 LAB — CULTURE, BLOOD (ROUTINE X 2)
Culture: NO GROWTH
Special Requests: ADEQUATE

## 2023-01-26 LAB — PROTIME-INR
INR: 1.7 — ABNORMAL HIGH (ref 0.8–1.2)
Prothrombin Time: 20.5 seconds — ABNORMAL HIGH (ref 11.4–15.2)

## 2023-01-26 LAB — HEPARIN LEVEL (UNFRACTIONATED): Heparin Unfractionated: 0.38 IU/mL (ref 0.30–0.70)

## 2023-01-26 LAB — GLUCOSE, CAPILLARY
Glucose-Capillary: 158 mg/dL — ABNORMAL HIGH (ref 70–99)
Glucose-Capillary: 172 mg/dL — ABNORMAL HIGH (ref 70–99)
Glucose-Capillary: 214 mg/dL — ABNORMAL HIGH (ref 70–99)
Glucose-Capillary: 240 mg/dL — ABNORMAL HIGH (ref 70–99)

## 2023-01-26 LAB — AEROBIC/ANAEROBIC CULTURE W GRAM STAIN (SURGICAL/DEEP WOUND)

## 2023-01-26 MED ORDER — SODIUM CHLORIDE 0.9% FLUSH: 10.0000 mL | INTRAVENOUS | Status: DC | PRN

## 2023-01-26 MED ORDER — CEFTRIAXONE IV (FOR PTA / DISCHARGE USE ONLY)
2.0000 g | Freq: Two times a day (BID) | INTRAVENOUS | 0 refills | Status: AC
Start: 2023-01-26 — End: 2023-03-08

## 2023-01-26 MED ORDER — AMPICILLIN IV (FOR PTA / DISCHARGE USE ONLY)
12.0000 g | INTRAVENOUS | 0 refills | Status: AC
Start: 2023-01-26 — End: 2023-03-08

## 2023-01-26 MED ORDER — WARFARIN SODIUM 10 MG PO TABS
10.0000 mg | ORAL_TABLET | Freq: Once | ORAL | Status: AC
  Administered 2023-01-26: 10 mg via ORAL
  Filled 2023-01-26: qty 1

## 2023-01-26 MED ORDER — CHLORHEXIDINE GLUCONATE CLOTH 2 % EX PADS
6.0000 | MEDICATED_PAD | Freq: Every day | CUTANEOUS | Status: DC
  Administered 2023-01-26 – 2023-01-27 (×2): 6 via TOPICAL

## 2023-01-26 MED ORDER — SODIUM CHLORIDE 0.9% FLUSH
10.0000 mL | Freq: Two times a day (BID) | INTRAVENOUS | Status: DC
  Administered 2023-01-26 – 2023-01-27 (×3): 10 mL

## 2023-01-26 NOTE — Progress Notes (Signed)
Occupational Therapy Treatment Patient Details Name: Dennis Bates MRN: [id number] DOB: [birth year] Today's Date: 01/26/2023   History of present illness Pt is a 62 y.o. male admitted to [facility] [place] 01/20/23 following 2-3 weeks hx R knee swelling and R knee, back, and abdominal pain. Pt presented to urgent care and found to be positive for enterococcal bacteremia. Admitted to [facility] for further evaluation. R knee total revision, irregation, and debridment on 5/13. PMH of aortic stenosis-s/p AVR (mechanical valve) in 2016, CAD s/p PCI 2016, HLD, HTN, s/p right total knee arthroplasty 2019.   OT comments  Pt supine in bed upon OT arrival. Pt agreeable to participation in skilled OT session. OT instructed pt in use of AE for increased safety and independence with ADLs with pt verbalizing and demonstrating understanding through teach back. Pt demonstrates ability to complete LB dressing/bathing with lateral leans and use of AE with Min guard to Min assist and cues for technique. OT also instructed pt in techniques for safety functional transfer with pt performing step-pivot transfer from bed to recliner with use of RW with Min guard assist and cues for safety and technique. Pt is making good progress toward goals. Pt will benefit from continued acute skilled OT services. Discharge plan remains appropriate.    Recommendations for follow up therapy are one component of a multi-disciplinary discharge planning process, led by the attending physician.  Recommendations may be updated based on patient status, additional functional criteria and insurance authorization.    Assistance Recommended at Discharge Intermittent Supervision/Assistance  Patient can return home with the following  A little help with walking and/or transfers;A little help with bathing/dressing/bathroom;Assistance with cooking/housework;Assist for transportation;Help with stairs or ramp for entrance   Equipment Recommendations       Recommendations  for Other Services      Precautions / Restrictions Precautions Precautions: Fall Required Braces or Orthoses: Knee Immobilizer - Right Knee Immobilizer - Right: On when out of bed or walking;Discontinue once straight leg raise with < 10 degree lag Restrictions Weight Bearing Restrictions: Yes RLE Weight Bearing: Weight bearing as tolerated       Mobility Bed Mobility Overal bed mobility: Needs Assistance Bed Mobility: Supine to Sit     Supine to sit: Min guard, HOB elevated     General bed mobility comments: Requires increased time    Transfers Overall transfer level: Needs assistance Equipment used: Rolling walker (2 wheels) Transfers: Sit to/from Stand, Bed to chair/wheelchair/BSC Sit to Stand: Min guard     Step pivot transfers: Min guard     General transfer comment: minG for balance and cueing for technique with BUE placement and RLE management     Balance Overall balance assessment: Needs assistance Sitting-balance support: No upper extremity supported, Single extremity supported, Feet supported Sitting balance-Leahy Scale: Fair     Standing balance support: Bilateral upper extremity supported, During functional activity, Reliant on assistive device for balance Standing balance-Leahy Scale: Poor                             ADL either performed or assessed with clinical judgement   ADL Overall ADL's : Needs assistance/impaired             Lower Body Bathing: Min guard;Minimal assistance;Cueing for compensatory techniques;Sitting/lateral leans (Simulated task with use of long handled sponge) Lower Body Bathing Details (indicate cue type and reason): OT instructed pt in techniques for increased safety and independence in LB bathing with  use of long handled sponge in simulated task.     Lower Body Dressing: Min guard;Cueing for compensatory techniques;Sitting/lateral leans;Minimal assistance (with use of sock aid, long handled show horn, and  reacher.) Lower Body Dressing Details (indicate cue type and reason): OT instructed pt in use of sock aid, long handled shoe horn, and reacher to increase independence with LB dressing with pt verbalizing and demonstrating understanding through teach back. Pt will benefit from reinforcement of training. Toilet Transfer: Min guard;Rolling walker (2 wheels);Cueing for safety (Step-pivot) Toilet Transfer Details (indicate cue type and reason): Simulated task with functional transfer from bed to recliner.                Extremity/Trunk Assessment              Vision       Perception     Praxis      Cognition Arousal/Alertness: Awake/alert Behavior During Therapy: WFL for tasks assessed/performed Overall Cognitive Status: Within Functional Limits for tasks assessed                                 General Comments: A&Ox4, pleasant during session        Exercises      Shoulder Instructions       General Comments VSS on RA throughout session. Pt's daughter present through a portion of the session.     Pertinent Vitals/ Pain       Pain Assessment Pain Assessment: Faces Faces Pain Scale: Hurts little more Pain Location: R knee Pain Descriptors / Indicators: Discomfort, Grimacing, Guarding Pain Intervention(s): Limited activity within patient's tolerance, Monitored during session, Premedicated before session, Repositioned  Home Living                                          Prior Functioning/Environment              Frequency           Progress Toward Goals  OT Goals(current goals can now be found in the care plan section)  Progress towards OT goals: Progressing toward goals  Acute Rehab OT Goals Patient Stated Goal: To be more independent with ADLs  Plan Discharge plan remains appropriate    Co-evaluation                 AM-PAC OT "6 Clicks" Daily Activity     Outcome Measure   Help from another person  eating meals?: None Help from another person taking care of personal grooming?: None (in sitting) Help from another person toileting, which includes using toliet, bedpan, or urinal?: A Little Help from another person bathing (including washing, rinsing, drying)?: A Little (with use of AE) Help from another person to put on and taking off regular upper body clothing?: None Help from another person to put on and taking off regular lower body clothing?: A Little (with use of AE) 6 Click Score: 21    End of Session Equipment Utilized During Treatment: Gait belt;Rolling walker (2 wheels);Other (comment) (sock aid, reacher, long handled shoe horn, long handled sponge)  OT Visit Diagnosis: Unsteadiness on feet (R26.81)   Activity Tolerance Patient tolerated treatment well   Patient Left in chair;with call bell/phone within reach;with family/visitor present;with nursing/sitter in room   Nurse Communication Mobility status;Other (comment) (Pain level)  Time: 0981-1914 OT Time Calculation (min): 42 min  Charges: OT General Charges $OT Visit: 1 Visit OT Treatments $Self Care/Home Management : 38-52 mins  Lorraina Spring "Orson Eva., OTR/L, MA Acute Rehab [phone]   Lendon Colonel 01/26/2023, 1:05 PM

## 2023-01-26 NOTE — Progress Notes (Signed)
Orthopedic Tech Progress Note Patient Details:  Dennis Bates 02-15-1961 [id number]  Patient ID: [name], male   DOB: April 20, 1961, 62 y.o.   MRN: [id number] Micah Flesher back to check on patient and he said he was taken off of CPM at 5:00 pm.   Danesha Kirchoff OTR/L 01/26/2023, 6:50 PM

## 2023-01-26 NOTE — Progress Notes (Addendum)
ANTICOAGULATION CONSULT NOTE  Pharmacy Consult for heparin, warfarin Indication: mechanical aortic valve  Not on File  Patient Measurements: Height: 5\' 7"  (170.2 cm) Weight: 77.3 kg (170 lb 6.7 oz) IBW/kg (Calculated) : 66.1 Heparin Dosing Weight: 77.3  Vital Signs: Temp: 97.9 F (36.6 C) (05/15 1153) Temp Source: Oral (05/15 1153) BP: 191/71 (05/15 1153) Pulse Rate: 69 (05/15 0328)  Labs: Recent Labs    01/24/23 0348 01/25/23 0444 01/25/23 1205 01/26/23 0730  HGB 10.8* 11.8*  --  10.7*  HCT 32.9* 35.7*  --  32.6*  PLT 397 480*  --  544*  LABPROT 18.3* 17.6*  --  20.5*  INR 1.5* 1.4*  --  1.7*  HEPARINUNFRC 0.36  --  0.37 0.38  CREATININE  --  0.83  --   --      Estimated Creatinine Clearance: 87.4 mL/min (by C-G formula based on SCr of 0.83 mg/dL).  Assessment: 62 yo male with h/o of mechanical aortic valve on warfarin PTA. Admitted with Enterococcus bacteremia. Patient on warfarin 10mg  daily except 15mg  on Mon and Thursdays per HML. Patient's INR was 4 on 5/6 (last dose of warfarin stated on 5/5 prior to admit). INR on admit is 1.8 (goal 2.5-3.5).  Last Warfarin dose 10mg  given while inpatient on 01/20/23.   Patient transitioned to heparin drip while warfarin held 5/10 for knee washout 5/13.   Back on IV heparin bridge/ Warfarin resumed s/p orthopedic procedure knee washout 5/13. Heparin until INR therapeutic.  Heparin level 0.38 , remains, therapeutic on heparin 2100 units/hr INR 1.4> 1.7 ,  Hgb  10.7 stable,  pltc  544. No bleeding reported.  PTA Warfarin dose:  10mg  daily except 15mg  every Mon and Thurs. Warfarin held 5/10 > 5/12, resumed 5/13 post op.,    Goal of Therapy:  Heparin level 0.3 - 0.7  INR 2.5-3.5   Monitor platelets by anticoagulation protocol: Yes   Plan:  Warfarin 10mg  PO x 1 tonight Continue IV heparin 2100 units/hr Daily heparin level, CBC, INR Monitor for s/sx of bleeding  Thank you for allowing pharmacy to be part of this patients  care team.  Noah Delaine, RPh Clinical Pharmacist Please see amion for complete clinical pharmacist phone list 01/26/2023 1:55 PM

## 2023-01-26 NOTE — Progress Notes (Signed)
Subjective: 2 Days Post-Op Procedure(s) (LRB): TOTAL KNEE REVISION (Right) IRRIGATION AND DEBRIDEMENT KNEE (Right) Patient reports pain as moderate.  Pain was better yesterday.  He had a definite increase in his pain today.  Objective: Vital signs in last 24 hours: Temp:  [97.9 F (36.6 C)-98.7 F (37.1 C)] 97.9 F (36.6 C) (05/15 1153) Pulse Rate:  [69-81] 69 (05/15 0328) Resp:  [18] 18 (05/15 1153) BP: (131-191)/(70-90) 191/71 (05/15 1153)  Intake/Output from previous day: 05/14 0701 - 05/15 0700 In: 2211.5 [I.V.:1502.8; IV Piggyback:708.8] Out: 3500 [Urine:3500] Intake/Output this shift: No intake/output data recorded.  Recent Labs    01/24/23 0348 01/25/23 0444 01/26/23 0730  HGB 10.8* 11.8* 10.7*   Recent Labs    01/25/23 0444 01/26/23 0730  WBC 8.9 8.2  RBC 4.12* 3.73*  HCT 35.7* 32.6*  PLT 480* 544*   Recent Labs    01/25/23 0444  NA 134*  K 4.1  CL 98  CO2 25  BUN 9  CREATININE 0.83  GLUCOSE 217*  CALCIUM 8.8*   Recent Labs    01/25/23 0444 01/26/23 0730  INR 1.4* 1.7*    Neurologically intact ABD soft Neurovascular intact Sensation intact distally Intact pulses distally Dorsiflexion/Plantar flexion intact No cellulitis present Compartment soft Range of motion is 5 to 15 to 20 degrees.   Assessment/Plan: 2 Days Post-Op Procedure(s) (LRB): TOTAL KNEE REVISION (Right) IRRIGATION AND DEBRIDEMENT KNEE (Right) Advance diet Up with therapy Plan for discharge tomorrow I had ordered CPM for the patient yesterday.  I am not certain why it did not get placed.  I talked to the unit secretary today and ask her to please call the orthopedic tech and see if we can get CPM placed on him soon as possible.  I DC'd his drains today because they really were not draining anything but he does have some fluid in the knee joint.  I am hopeful we can start to get some motion and may be some of that retained blood in the knee can work its way out.  At this  point I think his antibiotic regimen needs to be set and when he is set on his Coumadin I think it is fine for him to be discharged.  I will plan on seeing him back in the office in a couple weeks.      Harvie Junior 01/26/2023, 12:53 PM

## 2023-01-26 NOTE — Progress Notes (Signed)
Orthopedic Tech Progress Note Patient Details:  Dennis Bates June 12, 1961 [id number]  CPM Right Knee CPM Right Knee: On Right Knee Flexion (Degrees): 45 Right Knee Extension (Degrees): 0 No specification in orders at to time CPM should stay on.  Will check back after 3-4 hours.   Post Interventions Patient Tolerated: Well Instructions Provided: Adjustment of device  Phinley Schall OTR/L 01/26/2023, 1:15 PM

## 2023-01-26 NOTE — Progress Notes (Signed)
PROGRESS NOTE        PATIENT DETAILS Name: Dennis Bates Age: 62 y.o. Sex: male Date of Birth: 06-27-1961 Admit Date: 01/20/2023 Admitting Physician Jonah Blue, MD [id number], Elberta Fortis, MD  Brief Summary: Patient is a 62 y.o.  male with history of aortic stenosis-s/p AVR (mechanical valve) in 2016, CAD s/p PCI 2016, HLD, HTN, s/p right total knee arthroplasty 2019 who presented with 2-3 weeks history of subjective fever, abdominal pain, back pain, right knee swelling-she was seen at a local urgent care-and had blood cultures drawn which was subsequently positive, patient was then admitted to the hospitalist service for further evaluation and treatment.  Significant events: 5/8>> [facility]-abdominal pain-subjective fever-blood cultures drawn. 5/9>> blood cultures positive for Enterococcus-referred to the ED-admit to [facility].  Significant studies: 5/8>> CT chest/abdomen/pelvis: No consolidation/pneumothorax/effusion-no bowel obstruction.  Gallstones in the nondilated gallbladder 5/8>> CT head: Age-indeterminate bilateral cerebellar infarcts 5/9>> x-ray right knee: Total knee arthroplasty-trace joint fluid 5/9>> echo: EF 55-60%-no obvious vegetation seen on prosthetic aortic valve. 5/10>> TEE: No vegetation  Significant microbiology data: 5/8>> blood culture: Enterococcus faecalis 5/9>> synovial fluid culture: Enterococcus faecalis 5/10>> blood culture: No growth  Procedures: 5/9>> arthrocentesis by orthopedics (WBC 54,250 with 76% neutrophils-no crystals) 5/10>> TEE: No vegetation 5/13>> total right knee revision, 4 compartment synovectomy, removal of foreign body 5/15>> PICC line  Consults: ID Orthopedics  Subjective: No major issues overnight-lying comfortably in bed.  Objective: Vitals: Blood pressure (!) 144/90, pulse 69, temperature 98.1 F (36.7 C), temperature source Oral, resp. rate 18, height 5\' 7"  (1.702 m), weight  77.3 kg, SpO2 95 %.   Exam: Gen Exam:Alert awake-not in any distress HEENT:atraumatic, normocephalic Chest: B/L clear to auscultation anteriorly CVS:S1S2 regular Abdomen:soft non tender, non distended Extremities:no edema Neurology: Non focal Skin: no rash  Pertinent Labs/Radiology:    Latest Ref Rng & Units 01/26/2023    7:30 AM 01/25/2023    4:44 AM 01/24/2023    3:48 AM  CBC  WBC 4.0 - 10.5 K/uL 8.2  8.9  6.7   Hemoglobin 13.0 - 17.0 g/dL 81.1  91.4  78.2   Hematocrit 39.0 - 52.0 % 32.6  35.7  32.9   Platelets 150 - 400 K/uL 544  480  397     Lab Results  Component Value Date   NA 134 (L) 01/25/2023   K 4.1 01/25/2023   CL 98 01/25/2023   CO2 25 01/25/2023      Assessment/Plan: 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  History of mechanical aortic valve replacement Coumadin held for orthopedic procedure on 5/13 Back on overlapping IV heparin/Coumadin.   INR remained subtherapeutic-goal INR 2-3.  History of CAD s/p PCI 2016 No anginal symptoms  DM-2 (A1c 7.0 on 5/6) CBG stable on SSI Resume metformin/Jardiance on discharge.  Recent Labs    01/25/23 1531 01/25/23 2022 01/26/23 0733  GLUCAP 139* 190* 158*      HTN BP stable on Coreg  HLD Statin/Lopid  Cholelithiasis Asymptomatic at this point-primary problem is enterococcal infection-he will require outpatient elective general surgery evaluation for cholecystectomy.  BMI: Estimated body mass index is 26.69 kg/m as calculated from the following:  Height as of this encounter: 5\' 7"  (1.702 m).   Weight as of this encounter: 77.3 kg.   Code status:   Code Status: Full Code   DVT Prophylaxis:IV heparin   Family Communication: None at bedside this  morning   Disposition Plan: Status is: Inpatient Remains inpatient appropriate because: Severity of illness   Planned Discharge Destination:Home with home health   Diet: Diet Order             Diet Carb Modified Fluid consistency: Thin; Room service appropriate? Yes  Diet effective now                     Antimicrobial agents: Anti-infectives (From admission, onward)    Start     Dose/Rate Route Frequency Ordered Stop   01/25/23 0600  ceFAZolin (ANCEF) IVPB 2g/100 mL premix        2 g 200 mL/hr over 30 Minutes Intravenous On call to O.R. 01/24/23 1325 01/24/23 1606   01/24/23 1649  vancomycin (VANCOCIN) powder  Status:  Discontinued          As needed 01/24/23 1650 01/24/23 1757   01/24/23 0600  ceFAZolin (ANCEF) IVPB 2g/100 mL premix  Status:  Discontinued        2 g 200 mL/hr over 30 Minutes Intravenous On call to O.R. 01/23/23 1914 01/24/23 1852   01/20/23 1100  ampicillin (OMNIPEN) 2 g in sodium chloride 0.9 % 100 mL IVPB        2 g 300 mL/hr over 20 Minutes Intravenous Every 4 hours 01/20/23 0834     01/20/23 1100  cefTRIAXone (ROCEPHIN) 2 g in sodium chloride 0.9 % 100 mL IVPB        2 g 200 mL/hr over 30 Minutes Intravenous Every 12 hours 01/20/23 0834     01/20/23 0415  piperacillin-tazobactam (ZOSYN) IVPB 3.375 g        3.375 g 100 mL/hr over 30 Minutes Intravenous  Once 01/20/23 0403 01/20/23 0500        MEDICATIONS: Scheduled Meds:  carvedilol  3.125 mg Oral BID   Chlorhexidine Gluconate Cloth  6 each Topical Daily   gemfibrozil  600 mg Oral BID AC   insulin aspart  0-15 Units Subcutaneous TID WC   insulin aspart  0-5 Units Subcutaneous QHS   multivitamin with minerals  1 tablet Oral Daily   rosuvastatin  20 mg Oral QHS   sodium chloride flush  10-40 mL Intracatheter Q12H   Warfarin - Pharmacist Dosing Inpatient   Does not apply q1600   Continuous Infusions:  ampicillin (OMNIPEN) IV 2 g (01/26/23 1107)   cefTRIAXone (ROCEPHIN)  IV 2 g  (01/26/23 1116)   heparin 2,100 Units/hr (01/26/23 0426)   lactated ringers 75 mL/hr at 01/26/23 0602   PRN Meds:.acetaminophen **OR** acetaminophen, hydrALAZINE, morphine injection, ondansetron **OR** ondansetron (ZOFRAN) IV, oxyCODONE, sodium chloride flush   I have personally reviewed following labs and imaging studies  LABORATORY DATA: CBC: Recent Labs  Lab 01/20/23 0418 01/21/23 0546 01/22/23 0629 01/23/23 0602 01/24/23 0348 01/25/23 0444 01/26/23 0730  WBC 9.5   < > 5.8 5.9 6.7 8.9 8.2  NEUTROABS 7.5  --   --   --   --   --   --   HGB 11.9*   < > 11.2* 10.9* 10.8* 11.8* 10.7*  HCT 34.3*   < > 33.8* 33.4* 32.9* 35.7* 32.6*  MCV 84.1   < > 86.0 84.6 86.8 86.7 87.4  PLT  234   < > 299 338 397 480* 544*   < > = values in this interval not displayed.     Basic Metabolic Panel: Recent Labs  Lab 01/20/23 0418 01/21/23 0546 01/23/23 0602 01/25/23 0444  NA 130* 135 136 134*  K 3.7 3.8 4.1 4.1  CL 96* 101 102 98  CO2 21* 21* 25 25  GLUCOSE 134* 121* 154* 217*  BUN 15 13 5* 9  CREATININE 0.87 0.97 0.77 0.83  CALCIUM 9.1 8.8* 8.7* 8.8*     GFR: Estimated Creatinine Clearance: 87.4 mL/min (by C-G formula based on SCr of 0.83 mg/dL).  Liver Function Tests: Recent Labs  Lab 01/20/23 0418  AST 28  ALT 26  ALKPHOS 41  BILITOT 1.1  PROT 6.2*  ALBUMIN 3.0*    Recent Labs  Lab 01/20/23 0418  LIPASE 115*    No results for input(s): "AMMONIA" in the last 168 hours.   Coagulation Profile: Recent Labs  Lab 01/22/23 0629 01/23/23 0602 01/24/23 0348 01/25/23 0444 01/26/23 0730  INR 2.2* 2.1* 1.5* 1.4* 1.7*     Cardiac Enzymes: No results for input(s): "CKTOTAL", "CKMB", "CKMBINDEX", "TROPONINI" in the last 168 hours.   BNP (last 3 results) No results for input(s): "PROBNP" in the last 8760 hours.  Lipid Profile: No results for input(s): "CHOL", "HDL", "LDLCALC", "TRIG", "CHOLHDL", "LDLDIRECT" in the last 72 hours.  Thyroid Function Tests: No  results for input(s): "TSH", "T4TOTAL", "FREET4", "T3FREE", "THYROIDAB" in the last 72 hours.  Anemia Panel: No results for input(s): "VITAMINB12", "FOLATE", "FERRITIN", "TIBC", "IRON", "RETICCTPCT" in the last 72 hours.  Urine analysis:    Component Value Date/Time   COLORURINE YELLOW 01/19/2023 1200   APPEARANCEUR CLEAR 01/19/2023 1200   LABSPEC >1.046 (H) 01/19/2023 1200   PHURINE 6.0 01/19/2023 1200   GLUCOSEU >1,000 (A) 01/19/2023 1200   HGBUR SMALL (A) 01/19/2023 1200   BILIRUBINUR NEGATIVE 01/19/2023 1200   KETONESUR 40 (A) 01/19/2023 1200   PROTEINUR TRACE (A) 01/19/2023 1200   NITRITE NEGATIVE 01/19/2023 1200   LEUKOCYTESUR NEGATIVE 01/19/2023 1200    Sepsis Labs: Lactic Acid, Venous    Component Value Date/Time   LATICACIDVEN 1.5 10/20/2015 2200    MICROBIOLOGY: Recent Results (from the past 240 hour(s))  Culture, blood (routine x 2)     Status: Abnormal   Collection Time: 01/19/23  9:34 AM   Specimen: BLOOD  Result Value Ref Range Status   Specimen Description   Final    BLOOD LEFT ARM Performed at [facility], [address] [place], [place] [address]    Special Requests   Final    BOTTLES DRAWN AEROBIC AND ANAEROBIC Blood Culture adequate volume Performed at [facility], [address] [place], [place] [address]    Culture  Setup Time   Final    GRAM POSITIVE COCCI IN BOTH AEROBIC AND ANAEROBIC BOTTLES CRITICAL RESULT CALLED TO, READ BACK BY AND VERIFIED WITH: KELLY GIBSON,RN@0039  01/20/23 MK Performed at [facility], [address], [place], [place] [address]    Culture ENTEROCOCCUS FAECALIS (A)  Final   Report Status 01/22/2023 FINAL  Final   Organism ID, Bacteria ENTEROCOCCUS FAECALIS  Final      Susceptibility   Enterococcus faecalis - MIC*    AMPICILLIN <=2 SENSITIVE Sensitive     VANCOMYCIN 1 SENSITIVE Sensitive     GENTAMICIN SYNERGY SENSITIVE Sensitive     * ENTEROCOCCUS FAECALIS  Blood  Culture ID Panel (Reflexed)  Status: Abnormal   Collection Time: 01/19/23  9:34 AM  Result Value Ref Range Status   Enterococcus faecalis DETECTED (A) NOT DETECTED Final    Comment: CRITICAL RESULT CALLED TO, READ BACK BY AND VERIFIED WITH: KELLY GIBSON,RN@0040  01/20/23 MK    Enterococcus Faecium NOT DETECTED NOT DETECTED Final   Listeria monocytogenes NOT DETECTED NOT DETECTED Final   Staphylococcus species NOT DETECTED NOT DETECTED Final   Staphylococcus aureus (BCID) NOT DETECTED NOT DETECTED Final   Staphylococcus epidermidis NOT DETECTED NOT DETECTED Final   Staphylococcus lugdunensis NOT DETECTED NOT DETECTED Final   Streptococcus species NOT DETECTED NOT DETECTED Final   Streptococcus agalactiae NOT DETECTED NOT DETECTED Final   Streptococcus pneumoniae NOT DETECTED NOT DETECTED Final   Streptococcus pyogenes NOT DETECTED NOT DETECTED Final   A.calcoaceticus-baumannii NOT DETECTED NOT DETECTED Final   Bacteroides fragilis NOT DETECTED NOT DETECTED Final   Enterobacterales NOT DETECTED NOT DETECTED Final   Enterobacter cloacae complex NOT DETECTED NOT DETECTED Final   Escherichia coli NOT DETECTED NOT DETECTED Final   Klebsiella aerogenes NOT DETECTED NOT DETECTED Final   Klebsiella oxytoca NOT DETECTED NOT DETECTED Final   Klebsiella pneumoniae NOT DETECTED NOT DETECTED Final   Proteus species NOT DETECTED NOT DETECTED Final   Salmonella species NOT DETECTED NOT DETECTED Final   Serratia marcescens NOT DETECTED NOT DETECTED Final   Haemophilus influenzae NOT DETECTED NOT DETECTED Final   Neisseria meningitidis NOT DETECTED NOT DETECTED Final   Pseudomonas aeruginosa NOT DETECTED NOT DETECTED Final   Stenotrophomonas maltophilia NOT DETECTED NOT DETECTED Final   Candida albicans NOT DETECTED NOT DETECTED Final   Candida auris NOT DETECTED NOT DETECTED Final   Candida glabrata NOT DETECTED NOT DETECTED Final   Candida krusei NOT DETECTED NOT DETECTED Final   Candida  parapsilosis NOT DETECTED NOT DETECTED Final   Candida tropicalis NOT DETECTED NOT DETECTED Final   Cryptococcus neoformans/gattii NOT DETECTED NOT DETECTED Final   Vancomycin resistance NOT DETECTED NOT DETECTED Final    Comment: Performed at [facility], [address], [place], [place] [address]  Culture, blood (routine x 2)     Status: Abnormal   Collection Time: 01/19/23 10:15 AM   Specimen: BLOOD  Result Value Ref Range Status   Specimen Description   Final    BLOOD BLOOD RIGHT FOREARM Performed at [facility], [address], [place], [place] [address]    Special Requests   Final    BOTTLES DRAWN AEROBIC AND ANAEROBIC Blood Culture adequate volume Performed at [facility], [address], [place], [place] [address]    Culture  Setup Time   Final    GRAM POSITIVE COCCI IN CHAINS IN BOTH AEROBIC AND ANAEROBIC BOTTLES CRITICAL VALUE NOTED.  VALUE IS CONSISTENT WITH PREVIOUSLY REPORTED AND CALLED VALUE.    Culture (A)  Final    ENTEROCOCCUS FAECALIS SUSCEPTIBILITIES PERFORMED ON PREVIOUS CULTURE WITHIN THE LAST 5 DAYS. Performed at [facility], [address] [place], [place] [address]    Report Status 01/22/2023 FINAL  Final  Resp panel by RT-PCR (RSV, Flu A&B, Covid) Anterior Nasal Swab     Status: None   Collection Time: 01/19/23 12:08 PM   Specimen: Anterior Nasal Swab  Result Value Ref Range Status   SARS Coronavirus 2 by RT PCR NEGATIVE NEGATIVE Final    Comment: (NOTE) SARS-CoV-2 target nucleic acids are NOT DETECTED.  The SARS-CoV-2 RNA is generally detectable in upper respiratory specimens during the  acute phase of infection. The lowest concentration of SARS-CoV-2 viral copies this assay can detect is 138 copies/mL. A negative result does not preclude SARS-Cov-2 infection and should not be used as the sole basis for treatment or other patient management decisions. A negative result may occur with  improper  specimen collection/handling, submission of specimen other than nasopharyngeal swab, presence of viral mutation(s) within the areas targeted by this assay, and inadequate number of viral copies(<138 copies/mL). A negative result must be combined with clinical observations, patient history, and epidemiological information. The expected result is Negative.  Fact Sheet for Patients:  BloggerCourse.com  Fact Sheet for Healthcare Providers:  SeriousBroker.it  This test is no t yet approved or cleared by the [place] FDA and  has been authorized for detection and/or diagnosis of SARS-CoV-2 by FDA under an Emergency Use Authorization (EUA). This EUA will remain  in effect (meaning this test can be used) for the duration of the COVID-19 declaration under Section 564(b)(1) of the Act, 21 U.S.C.section 360bbb-3(b)(1), unless the authorization is terminated  or revoked sooner.       Influenza A by PCR NEGATIVE NEGATIVE Final   Influenza B by PCR NEGATIVE NEGATIVE Final    Comment: (NOTE) The Xpert Xpress SARS-CoV-2/FLU/RSV plus assay is intended as an aid in the diagnosis of influenza from Nasopharyngeal swab specimens and should not be used as a sole basis for treatment. Nasal washings and aspirates are unacceptable for Xpert Xpress SARS-CoV-2/FLU/RSV testing.  Fact Sheet for Patients: BloggerCourse.com  Fact Sheet for Healthcare Providers: SeriousBroker.it  This test is not yet approved or cleared by the [place] FDA and has been authorized for detection and/or diagnosis of SARS-CoV-2 by FDA under an Emergency Use Authorization (EUA). This EUA will remain in effect (meaning this test can be used) for the duration of the COVID-19 declaration under Section 564(b)(1) of the Act, 21 U.S.C. section 360bbb-3(b)(1), unless the authorization is terminated or revoked.     Resp  Syncytial Virus by PCR NEGATIVE NEGATIVE Final    Comment: (NOTE) Fact Sheet for Patients: BloggerCourse.com  Fact Sheet for Healthcare Providers: SeriousBroker.it  This test is not yet approved or cleared by the [place] FDA and has been authorized for detection and/or diagnosis of SARS-CoV-2 by FDA under an Emergency Use Authorization (EUA). This EUA will remain in effect (meaning this test can be used) for the duration of the COVID-19 declaration under Section 564(b)(1) of the Act, 21 U.S.C. section 360bbb-3(b)(1), unless the authorization is terminated or revoked.  Performed at [facility], [address] [place], [place] [address]   Body fluid culture w Gram Stain     Status: None   Collection Time: 01/20/23  7:03 PM   Specimen: Body Fluid  Result Value Ref Range Status   Specimen Description FLUID  Final   Special Requests KNEE  Final   Gram Stain   Final    ABUNDANT WBC PRESENT, PREDOMINANTLY PMN NO ORGANISMS SEEN    Culture   Final    FEW ENTEROCOCCUS FAECALIS CRITICAL RESULT CALLED TO, READ BACK BY AND VERIFIED WITH: RN Oren Beckmann [phone] FCP Performed at [facility], [address] [place], [place] [address]    Report Status 01/23/2023 FINAL  Final   Organism ID, Bacteria ENTEROCOCCUS FAECALIS  Final      Susceptibility   Enterococcus faecalis - MIC*    AMPICILLIN <=2 SENSITIVE Sensitive     VANCOMYCIN 1 SENSITIVE Sensitive  GENTAMICIN SYNERGY SENSITIVE Sensitive     * FEW ENTEROCOCCUS FAECALIS  Culture, blood (Routine X 2) w Reflex to ID Panel     Status: None   Collection Time: 01/21/23  5:46 AM   Specimen: BLOOD RIGHT ARM  Result Value Ref Range Status   Specimen Description BLOOD RIGHT ARM  Final   Special Requests   Final    BOTTLES DRAWN AEROBIC AND ANAEROBIC Blood Culture results may not be optimal due to an excessive volume of blood received in culture bottles    Culture   Final    NO GROWTH 5 DAYS Performed at [facility], [address] [place], [place] [address]    Report Status 01/26/2023 FINAL  Final  Culture, blood (Routine X 2) w Reflex to ID Panel     Status: None   Collection Time: 01/21/23  5:47 AM   Specimen: BLOOD LEFT HAND  Result Value Ref Range Status   Specimen Description BLOOD LEFT HAND  Final   Special Requests   Final    BOTTLES DRAWN AEROBIC AND ANAEROBIC Blood Culture adequate volume   Culture   Final    NO GROWTH 5 DAYS Performed at [facility], [address], [place], [place] [address]    Report Status 01/26/2023 FINAL  Final  MRSA Next Gen by PCR, Nasal     Status: None   Collection Time: 01/23/23 10:36 PM   Specimen: Nasal Mucosa; Nasal Swab  Result Value Ref Range Status   MRSA by PCR Next Gen NOT DETECTED NOT DETECTED Final    Comment: (NOTE) The GeneXpert MRSA Assay (FDA approved for NASAL specimens only), is one component of a comprehensive MRSA colonization surveillance program. It is not intended to diagnose MRSA infection nor to guide or monitor treatment for MRSA infections. Test performance is not FDA approved in patients less than 21 years old. Performed at [facility], [address], [place], [place] [address]   Aerobic/Anaerobic Culture w Gram Stain (surgical/deep wound)     Status: None (Preliminary result)   Collection Time: 01/24/23  4:27 PM   Specimen: Wound; Body Fluid  Result Value Ref Range Status   Specimen Description FLUID RIGHT KNEE SYNOVIAL  Final   Special Requests SWAB PT ON ANCEF  Final   Gram Stain   Final    FEW WBC PRESENT, PREDOMINANTLY PMN NO ORGANISMS SEEN    Culture   Final    NO GROWTH 2 DAYS Performed at [facility], [address], [place], [place] [address]    Report Status PENDING  Incomplete    RADIOLOGY STUDIES/RESULTS: [place] EKG SITE RITE  Result Date: 01/26/2023 If Site Rite image not attached, placement could not be confirmed  due to current cardiac rhythm.    LOS: 6 days   Jeoffrey Massed, MD  [facility]    To contact the attending provider between 7A-7P or the covering provider during after hours 7P-7A, please log into the web site [email or web address] and access using universal  password for that web site. If you do not have the password, please call the hospital operator.  01/26/2023, 11:16 AM

## 2023-01-26 NOTE — Progress Notes (Signed)
Peripherally Inserted Central Catheter Placement  The IV Nurse has discussed with the patient and/or persons authorized to consent for the patient, the purpose of this procedure and the potential benefits and risks involved with this procedure.  The benefits include less needle sticks, lab draws from the catheter, and the patient may be discharged home with the catheter. Risks include, but not limited to, infection, bleeding, blood clot (thrombus formation), and puncture of an artery; nerve damage and irregular heartbeat and possibility to perform a PICC exchange if needed/ordered by physician.  Alternatives to this procedure were also discussed.  Bard Power PICC patient education guide, fact sheet on infection prevention and patient information card has been provided to patient /or left at bedside.    PICC Placement Documentation  PICC Double Lumen 01/26/23 Right Basilic 36 cm 1 cm (Active)  Indication for Insertion or Continuance of Line Home intravenous therapies (PICC only);Prolonged intravenous therapies 01/26/23 0956  Exposed Catheter (cm) 1 cm 01/26/23 0956  Site Assessment Clean, Dry, Intact 01/26/23 0956  Lumen #1 Status Flushed;Saline locked;Blood return noted 01/26/23 0956  Lumen #2 Status Flushed;Saline locked;Blood return noted 01/26/23 0956  Dressing Type Transparent;Securing device 01/26/23 0956  Dressing Status Antimicrobial disc in place 01/26/23 0956  Safety Lock Not Applicable 01/26/23 0956  Line Care Connections checked and tightened 01/26/23 0956  Line Adjustment (NICU/IV Team Only) No 01/26/23 0956  Dressing Intervention New dressing 01/26/23 0956  Dressing Change Due 02/02/23 01/26/23 0956       Vernona Rieger  Cicely Ortner 01/26/2023, 9:58 AM

## 2023-01-26 NOTE — Progress Notes (Signed)
Physical Therapy Treatment Patient Details Name: Dennis Bates MRN: [id number] DOB: 1960-11-27 Today's Date: 01/26/2023   History of Present Illness Pt is a 62 y.o. male admitted to [facility] 01/20/23 following 2-3 weeks hx R knee swelling and R knee, back, and abdominal pain. Pt presented to urgent care and found to be positive for enterococcal bacteremia. Admitted to [facility] for further evaluation. R knee total revision, irregation, and debridment on 5/13. PMH of aortic stenosis-s/p AVR (mechanical valve) in 2016, CAD s/p PCI 2016, HLD, HTN, s/p right total knee arthroplasty 2019.    PT Comments    Pt greeted supine in bed with CPM on and pt agreeable to session. Pt making continued progress towards acute goals with session focused on progression of mobility. Pt able to come to sitting EOB with light min A to manage RLE with pt able to initiate LE to EOB with LLE hooked under ankle. Pt requiring grossly min guard assist for functional transfers and gait with RW support for increased distance this session. Pt continues to be limited by RLE weakness and pain, decreased activity tolerance and impaired balance/postural reactions. Educated pt on appropriate activity progression, knee immobilizer use, CPM use and importance of time OOB with pt verbalizing understanding. Plan to trial stair negotiation in next session. Pt continues to benefit from skilled PT services to progress toward functional mobility goals.    Recommendations for follow up therapy are one component of a multi-disciplinary discharge planning process, led by the attending physician.  Recommendations may be updated based on patient status, additional functional criteria and insurance authorization.  Follow Up Recommendations       Assistance Recommended at Discharge Intermittent Supervision/Assistance  Patient can return home with the following A little help with walking and/or transfers;Assist for transportation;Help with stairs or ramp for  entrance   Equipment Recommendations  None recommended by PT    Recommendations for Other Services       Precautions / Restrictions Precautions Precautions: Fall Required Braces or Orthoses: Knee Immobilizer - Right Knee Immobilizer - Right: On when out of bed or walking;Discontinue once straight leg raise with < 10 degree lag Restrictions Weight Bearing Restrictions: Yes RLE Weight Bearing: Weight bearing as tolerated     Mobility  Bed Mobility Overal bed mobility: Needs Assistance Bed Mobility: Supine to Sit, Sit to Supine     Supine to sit: HOB elevated, Min assist Sit to supine: Min assist   General bed mobility comments: Requires increased time, assis to manage RLE    Transfers Overall transfer level: Needs assistance Equipment used: Rolling walker (2 wheels) Transfers: Sit to/from Stand, Bed to chair/wheelchair/BSC Sit to Stand: Min guard           General transfer comment: minG for balance and cueing for technique with BUE placement and RLE management    Ambulation/Gait Ambulation/Gait assistance: Min guard Gait Distance (Feet): 200 Feet Assistive device: Rolling walker (2 wheels) Gait Pattern/deviations: Step-to pattern, Step-through pattern, Antalgic Gait velocity: mildly decreased     General Gait Details: step-to pattern with minimal weight bearing through RLE initially progressing to step-through pattern with progressing distance. Cued for forward gaze and upright posture, able to self-correct during trial   Stairs             Wheelchair Mobility    Modified Rankin (Stroke Patients Only)       Balance Overall balance assessment: Needs assistance Sitting-balance support: No upper extremity supported, Single extremity supported, Feet supported Sitting balance-Leahy Scale: Fair  Standing balance support: Bilateral upper extremity supported, During functional activity, Reliant on assistive device for balance Standing balance-Leahy  Scale: Poor                              Cognition Arousal/Alertness: Awake/alert Behavior During Therapy: WFL for tasks assessed/performed Overall Cognitive Status: Within Functional Limits for tasks assessed                                 General Comments: A&Ox4, pleasant during session        Exercises Total Joint Exercises Quad Sets: AROM, Right, 10 reps Heel Slides: AROM, AAROM, Right, 10 reps, Seated Straight Leg Raises: AAROM, Right, 5 reps    General Comments General comments (skin integrity, edema, etc.): VSS on RA      Pertinent Vitals/Pain Pain Assessment Pain Assessment: Faces Faces Pain Scale: Hurts little more Pain Location: R knee Pain Descriptors / Indicators: Discomfort, Grimacing, Guarding Pain Intervention(s): Monitored during session, Limited activity within patient's tolerance    Home Living                          Prior Function            PT Goals (current goals can now be found in the care plan section) Acute Rehab PT Goals PT Goal Formulation: With patient/family Time For Goal Achievement: 02/04/23 Progress towards PT goals: Progressing toward goals    Frequency    Min 4X/week      PT Plan Current plan remains appropriate    Co-evaluation              AM-PAC PT "6 Clicks" Mobility   Outcome Measure  Help needed turning from your back to your side while in a flat bed without using bedrails?: A Little Help needed moving from lying on your back to sitting on the side of a flat bed without using bedrails?: A Little Help needed moving to and from a bed to a chair (including a wheelchair)?: A Little Help needed standing up from a chair using your arms (e.g., wheelchair or bedside chair)?: A Little Help needed to walk in hospital room?: A Little Help needed climbing 3-5 steps with a railing? : A Lot 6 Click Score: 17    End of Session Equipment Utilized During Treatment: Gait  belt Activity Tolerance: Patient tolerated treatment well Patient left: in bed;with call bell/phone within reach;with family/visitor present Nurse Communication: Mobility status PT Visit Diagnosis: Difficulty in walking, not elsewhere classified (R26.2);Other abnormalities of gait and mobility (R26.89)     Time: 4098-1191 PT Time Calculation (min) (ACUTE ONLY): 31 min  Charges:  $Gait Training: 8-22 mins $Therapeutic Activity: 8-22 mins                     Sintia Mckissic R. PTA Acute Rehabilitation Services Office: [phone]   Catalina Antigua 01/26/2023, 3:56 PM

## 2023-01-27 DIAGNOSIS — T8459XS Infection and inflammatory reaction due to other internal joint prosthesis, sequela: Secondary | ICD-10-CM | POA: Diagnosis not present

## 2023-01-27 DIAGNOSIS — R7881 Bacteremia: Secondary | ICD-10-CM | POA: Diagnosis not present

## 2023-01-27 DIAGNOSIS — R591 Generalized enlarged lymph nodes: Secondary | ICD-10-CM | POA: Diagnosis not present

## 2023-01-27 DIAGNOSIS — B952 Enterococcus as the cause of diseases classified elsewhere: Secondary | ICD-10-CM | POA: Diagnosis not present

## 2023-01-27 DIAGNOSIS — E1169 Type 2 diabetes mellitus with other specified complication: Secondary | ICD-10-CM | POA: Diagnosis not present

## 2023-01-27 DIAGNOSIS — Z7901 Long term (current) use of anticoagulants: Secondary | ICD-10-CM | POA: Diagnosis not present

## 2023-01-27 DIAGNOSIS — T8453XA Infection and inflammatory reaction due to internal right knee prosthesis, initial encounter: Secondary | ICD-10-CM | POA: Diagnosis not present

## 2023-01-27 LAB — CBC
HCT: 30 % — ABNORMAL LOW (ref 39.0–52.0)
Hemoglobin: 9.7 g/dL — ABNORMAL LOW (ref 13.0–17.0)
MCH: 28.1 pg (ref 26.0–34.0)
MCHC: 32.3 g/dL (ref 30.0–36.0)
MCV: 87 fL (ref 80.0–100.0)
Platelets: 523 10*3/uL — ABNORMAL HIGH (ref 150–400)
RBC: 3.45 MIL/uL — ABNORMAL LOW (ref 4.22–5.81)
RDW: 13.2 % (ref 11.5–15.5)
WBC: 7.6 10*3/uL (ref 4.0–10.5)
nRBC: 0 % (ref 0.0–0.2)

## 2023-01-27 LAB — HEPARIN LEVEL (UNFRACTIONATED): Heparin Unfractionated: 0.41 IU/mL (ref 0.30–0.70)

## 2023-01-27 LAB — GLUCOSE, CAPILLARY
Glucose-Capillary: 277 mg/dL — ABNORMAL HIGH (ref 70–99)
Glucose-Capillary: 116 mg/dL — ABNORMAL HIGH (ref 70–99)
Glucose-Capillary: 268 mg/dL — ABNORMAL HIGH (ref 70–99)

## 2023-01-27 LAB — AEROBIC/ANAEROBIC CULTURE W GRAM STAIN (SURGICAL/DEEP WOUND)

## 2023-01-27 LAB — PROTIME-INR
INR: 2 — ABNORMAL HIGH (ref 0.8–1.2)
Prothrombin Time: 23.3 seconds — ABNORMAL HIGH (ref 11.4–15.2)

## 2023-01-27 MED ORDER — ONDANSETRON 4 MG PO TBDP: 4.0000 mg | ORAL_TABLET | ORAL | 0 refills | Status: DC | PRN

## 2023-01-27 MED ORDER — WARFARIN SODIUM 7.5 MG PO TABS
15.0000 mg | ORAL_TABLET | Freq: Once | ORAL | Status: AC
  Administered 2023-01-27: 15 mg via ORAL
  Filled 2023-01-27: qty 2

## 2023-01-27 MED ORDER — ORAL CARE MOUTH RINSE: 15.0000 mL | OROMUCOSAL | Status: DC | PRN

## 2023-01-27 MED ORDER — OXYCODONE-ACETAMINOPHEN 5-325 MG PO TABS: 1.0000 | ORAL_TABLET | Freq: Four times a day (QID) | ORAL | 0 refills | Status: DC | PRN

## 2023-01-27 NOTE — Progress Notes (Signed)
Assessed patient discharge readiness. Patient is going home with a PICC and is scheduled to receive at home PICC care and infusion education at 3pm today.   Discharge will be later today.  Orvan Seen SWOT RN

## 2023-01-27 NOTE — Progress Notes (Signed)
Subjective:  No new complaints   Antibiotics:  Anti-infectives (From admission, onward)    Start     Dose/Rate Route Frequency Ordered Stop   01/26/23 0000  ampicillin IVPB        12 g Intravenous Every 24 hours 01/26/23 1118 03/08/23 2359   01/26/23 0000  cefTRIAXone (ROCEPHIN) IVPB        2 g Intravenous Every 12 hours 01/26/23 1118 03/08/23 2359   01/25/23 0600  ceFAZolin (ANCEF) IVPB 2g/100 mL premix        2 g 200 mL/hr over 30 Minutes Intravenous On call to O.R. 01/24/23 1325 01/24/23 1606   01/24/23 1649  vancomycin (VANCOCIN) powder  Status:  Discontinued          As needed 01/24/23 1650 01/24/23 1757   01/24/23 0600  ceFAZolin (ANCEF) IVPB 2g/100 mL premix  Status:  Discontinued        2 g 200 mL/hr over 30 Minutes Intravenous On call to O.R. 01/23/23 1914 01/24/23 1852   01/20/23 1100  ampicillin (OMNIPEN) 2 g in sodium chloride 0.9 % 100 mL IVPB        2 g 300 mL/hr over 20 Minutes Intravenous Every 4 hours 01/20/23 0834     01/20/23 1100  cefTRIAXone (ROCEPHIN) 2 g in sodium chloride 0.9 % 100 mL IVPB        2 g 200 mL/hr over 30 Minutes Intravenous Every 12 hours 01/20/23 0834     01/20/23 0415  piperacillin-tazobactam (ZOSYN) IVPB 3.375 g        3.375 g 100 mL/hr over 30 Minutes Intravenous  Once 01/20/23 0403 01/20/23 0500       Medications: Scheduled Meds:  carvedilol  3.125 mg Oral BID   Chlorhexidine Gluconate Cloth  6 each Topical Daily   gemfibrozil  600 mg Oral BID AC   insulin aspart  0-15 Units Subcutaneous TID WC   insulin aspart  0-5 Units Subcutaneous QHS   multivitamin with minerals  1 tablet Oral Daily   rosuvastatin  20 mg Oral QHS   sodium chloride flush  10-40 mL Intracatheter Q12H   warfarin  15 mg Oral ONCE-1600   Warfarin - Pharmacist Dosing Inpatient   Does not apply q1600   Continuous Infusions:  ampicillin (OMNIPEN) IV 2 g (01/27/23 0916)   cefTRIAXone (ROCEPHIN)  IV 2 g (01/27/23 0936)   heparin 2,100 Units/hr  (01/27/23 0543)   PRN Meds:.acetaminophen **OR** acetaminophen, hydrALAZINE, morphine injection, ondansetron **OR** ondansetron (ZOFRAN) IV, mouth rinse, oxyCODONE, sodium chloride flush    Objective: Weight change:   Intake/Output Summary (Last 24 hours) at 01/27/2023 1036 Last data filed at 01/27/2023 0400 Gross per 24 hour  Intake 1210.86 ml  Output 700 ml  Net 510.86 ml   Blood pressure (!) 148/88, pulse 64, temperature 98.9 F (37.2 C), temperature source Oral, resp. rate 18, height 5\' 7"  (1.702 m), weight 77.3 kg, SpO2 98 %. Temp:  [97.9 F (36.6 C)-98.9 F (37.2 C)] 98.9 F (37.2 C) (05/16 0819) Pulse Rate:  [64-76] 64 (05/16 0819) Resp:  [16-19] 18 (05/16 0819) BP: (135-191)/(58-88) 148/88 (05/16 0819) SpO2:  [96 %-98 %] 98 % (05/16 0819)  Physical Exam: Physical Exam Constitutional:      Appearance: He is well-developed.  HENT:     Head: Normocephalic and atraumatic.  Eyes:     Conjunctiva/sclera: Conjunctivae normal.  Cardiovascular:     Rate and Rhythm: Normal rate and regular rhythm.  Pulmonary:     Effort: Pulmonary effort is normal. No respiratory distress.     Breath sounds: No wheezing.  Abdominal:     General: There is no distension.     Palpations: Abdomen is soft.  Musculoskeletal:     Cervical back: Normal range of motion and neck supple.  Skin:    General: Skin is warm and dry.     Findings: No erythema or rash.  Neurological:     General: No focal deficit present.     Mental Status: He is alert and oriented to person, place, and time.  Psychiatric:        Mood and Affect: Mood normal.        Behavior: Behavior normal.        Thought Content: Thought content normal.        Judgment: Judgment normal.      CBC:    BMET Recent Labs    01/25/23 0444  NA 134*  K 4.1  CL 98  CO2 25  GLUCOSE 217*  BUN 9  CREATININE 0.83  CALCIUM 8.8*      Liver Panel  No results for input(s): "PROT", "ALBUMIN", "AST", "ALT", "ALKPHOS",  "BILITOT", "BILIDIR", "IBILI" in the last 72 hours.     Sedimentation Rate No results for input(s): "ESRSEDRATE" in the last 72 hours. C-Reactive Protein No results for input(s): "CRP" in the last 72 hours.  Micro Results: Recent Results (from the past 720 hour(s))  Culture, blood (routine x 2)     Status: Abnormal   Collection Time: 01/19/23  9:34 AM   Specimen: BLOOD  Result Value Ref Range Status   Specimen Description   Final    BLOOD LEFT ARM Performed at [facility], [address], [place], [place] [address]    Special Requests   Final    BOTTLES DRAWN AEROBIC AND ANAEROBIC Blood Culture adequate volume Performed at [facility], [address] [place], [place] [address]    Culture  Setup Time   Final    GRAM POSITIVE COCCI IN BOTH AEROBIC AND ANAEROBIC BOTTLES CRITICAL RESULT CALLED TO, READ BACK BY AND VERIFIED WITH: KELLY GIBSON,RN@0039  01/20/23 MK Performed at [facility], [address], [place], [place] [address]    Culture ENTEROCOCCUS FAECALIS (A)  Final   Report Status 01/22/2023 FINAL  Final   Organism ID, Bacteria ENTEROCOCCUS FAECALIS  Final      Susceptibility   Enterococcus faecalis - MIC*    AMPICILLIN <=2 SENSITIVE Sensitive     VANCOMYCIN 1 SENSITIVE Sensitive     GENTAMICIN SYNERGY SENSITIVE Sensitive     * ENTEROCOCCUS FAECALIS  Blood Culture ID Panel (Reflexed)     Status: Abnormal   Collection Time: 01/19/23  9:34 AM  Result Value Ref Range Status   Enterococcus faecalis DETECTED (A) NOT DETECTED Final    Comment: CRITICAL RESULT CALLED TO, READ BACK BY AND VERIFIED WITH: KELLY GIBSON,RN@0040  01/20/23 MK    Enterococcus Faecium NOT DETECTED NOT DETECTED Final   Listeria monocytogenes NOT DETECTED NOT DETECTED Final   Staphylococcus species NOT DETECTED NOT DETECTED Final   Staphylococcus aureus (BCID) NOT DETECTED NOT DETECTED Final   Staphylococcus epidermidis NOT DETECTED NOT DETECTED  Final   Staphylococcus lugdunensis NOT DETECTED NOT DETECTED Final   Streptococcus species NOT DETECTED NOT DETECTED Final   Streptococcus agalactiae NOT DETECTED NOT DETECTED Final   Streptococcus pneumoniae NOT DETECTED NOT DETECTED Final   Streptococcus pyogenes NOT DETECTED  NOT DETECTED Final   A.calcoaceticus-baumannii NOT DETECTED NOT DETECTED Final   Bacteroides fragilis NOT DETECTED NOT DETECTED Final   Enterobacterales NOT DETECTED NOT DETECTED Final   Enterobacter cloacae complex NOT DETECTED NOT DETECTED Final   Escherichia coli NOT DETECTED NOT DETECTED Final   Klebsiella aerogenes NOT DETECTED NOT DETECTED Final   Klebsiella oxytoca NOT DETECTED NOT DETECTED Final   Klebsiella pneumoniae NOT DETECTED NOT DETECTED Final   Proteus species NOT DETECTED NOT DETECTED Final   Salmonella species NOT DETECTED NOT DETECTED Final   Serratia marcescens NOT DETECTED NOT DETECTED Final   Haemophilus influenzae NOT DETECTED NOT DETECTED Final   Neisseria meningitidis NOT DETECTED NOT DETECTED Final   Pseudomonas aeruginosa NOT DETECTED NOT DETECTED Final   Stenotrophomonas maltophilia NOT DETECTED NOT DETECTED Final   Candida albicans NOT DETECTED NOT DETECTED Final   Candida auris NOT DETECTED NOT DETECTED Final   Candida glabrata NOT DETECTED NOT DETECTED Final   Candida krusei NOT DETECTED NOT DETECTED Final   Candida parapsilosis NOT DETECTED NOT DETECTED Final   Candida tropicalis NOT DETECTED NOT DETECTED Final   Cryptococcus neoformans/gattii NOT DETECTED NOT DETECTED Final   Vancomycin resistance NOT DETECTED NOT DETECTED Final    Comment: Performed at [facility], [address], [place], [place] [address]  Culture, blood (routine x 2)     Status: Abnormal   Collection Time: 01/19/23 10:15 AM   Specimen: BLOOD  Result Value Ref Range Status   Specimen Description   Final    BLOOD BLOOD RIGHT FOREARM Performed at [facility], [address] [place], [place] [address]    Special Requests   Final    BOTTLES DRAWN AEROBIC AND ANAEROBIC Blood Culture adequate volume Performed at [facility], [address], [place], [place] [address]    Culture  Setup Time   Final    GRAM POSITIVE COCCI IN CHAINS IN BOTH AEROBIC AND ANAEROBIC BOTTLES CRITICAL VALUE NOTED.  VALUE IS CONSISTENT WITH PREVIOUSLY REPORTED AND CALLED VALUE.    Culture (A)  Final    ENTEROCOCCUS FAECALIS SUSCEPTIBILITIES PERFORMED ON PREVIOUS CULTURE WITHIN THE LAST 5 DAYS. Performed at [facility], [address], [place], [place] [address]    Report Status 01/22/2023 FINAL  Final  Resp panel by RT-PCR (RSV, Flu A&B, Covid) Anterior Nasal Swab     Status: None   Collection Time: 01/19/23 12:08 PM   Specimen: Anterior Nasal Swab  Result Value Ref Range Status   SARS Coronavirus 2 by RT PCR NEGATIVE NEGATIVE Final    Comment: (NOTE) SARS-CoV-2 target nucleic acids are NOT DETECTED.  The SARS-CoV-2 RNA is generally detectable in upper respiratory specimens during the acute phase of infection. The lowest concentration of SARS-CoV-2 viral copies this assay can detect is 138 copies/mL. A negative result does not preclude SARS-Cov-2 infection and should not be used as the sole basis for treatment or other patient management decisions. A negative result may occur with  improper specimen collection/handling, submission of specimen other than nasopharyngeal swab, presence of viral mutation(s) within the areas targeted by this assay, and inadequate number of viral copies(<138 copies/mL). A negative result must be combined with clinical observations, patient history, and epidemiological information. The expected result is Negative.  Fact Sheet for Patients:  BloggerCourse.com  Fact Sheet for Healthcare Providers:  SeriousBroker.it  This test is no t yet approved or cleared by the  [place] and  has been authorized  for detection and/or diagnosis of SARS-CoV-2 by FDA under an Emergency Use Authorization (EUA). This EUA will remain  in effect (meaning this test can be used) for the duration of the COVID-19 declaration under Section 564(b)(1) of the Act, 21 U.S.C.section 360bbb-3(b)(1), unless the authorization is terminated  or revoked sooner.       Influenza A by PCR NEGATIVE NEGATIVE Final   Influenza B by PCR NEGATIVE NEGATIVE Final    Comment: (NOTE) The Xpert Xpress SARS-CoV-2/FLU/RSV plus assay is intended as an aid in the diagnosis of influenza from Nasopharyngeal swab specimens and should not be used as a sole basis for treatment. Nasal washings and aspirates are unacceptable for Xpert Xpress SARS-CoV-2/FLU/RSV testing.  Fact Sheet for Patients: BloggerCourse.com  Fact Sheet for Healthcare Providers: SeriousBroker.it  This test is not yet approved or cleared by the [place] FDA and has been authorized for detection and/or diagnosis of SARS-CoV-2 by FDA under an Emergency Use Authorization (EUA). This EUA will remain in effect (meaning this test can be used) for the duration of the COVID-19 declaration under Section 564(b)(1) of the Act, 21 U.S.C. section 360bbb-3(b)(1), unless the authorization is terminated or revoked.     Resp Syncytial Virus by PCR NEGATIVE NEGATIVE Final    Comment: (NOTE) Fact Sheet for Patients: BloggerCourse.com  Fact Sheet for Healthcare Providers: SeriousBroker.it  This test is not yet approved or cleared by the [place] FDA and has been authorized for detection and/or diagnosis of SARS-CoV-2 by FDA under an Emergency Use Authorization (EUA). This EUA will remain in effect (meaning this test can be used) for the duration of the COVID-19 declaration under Section 564(b)(1) of the Act, 21 U.S.C. section  360bbb-3(b)(1), unless the authorization is terminated or revoked.  Performed at [facility], [address], [place], [place] [address]   Body fluid culture w Gram Stain     Status: None   Collection Time: 01/20/23  7:03 PM   Specimen: Body Fluid  Result Value Ref Range Status   Specimen Description FLUID  Final   Special Requests KNEE  Final   Gram Stain   Final    ABUNDANT WBC PRESENT, PREDOMINANTLY PMN NO ORGANISMS SEEN    Culture   Final    FEW ENTEROCOCCUS FAECALIS CRITICAL RESULT CALLED TO, READ BACK BY AND VERIFIED WITH: RN Oren Beckmann [phone] FCP Performed at [facility], [address], [place], [place] [address]    Report Status 01/23/2023 FINAL  Final   Organism ID, Bacteria ENTEROCOCCUS FAECALIS  Final      Susceptibility   Enterococcus faecalis - MIC*    AMPICILLIN <=2 SENSITIVE Sensitive     VANCOMYCIN 1 SENSITIVE Sensitive     GENTAMICIN SYNERGY SENSITIVE Sensitive     * FEW ENTEROCOCCUS FAECALIS  Culture, blood (Routine X 2) w Reflex to ID Panel     Status: None   Collection Time: 01/21/23  5:46 AM   Specimen: BLOOD RIGHT ARM  Result Value Ref Range Status   Specimen Description BLOOD RIGHT ARM  Final   Special Requests   Final    BOTTLES DRAWN AEROBIC AND ANAEROBIC Blood Culture results may not be optimal due to an excessive volume of blood received in culture bottles   Culture   Final    NO GROWTH 5 DAYS Performed at [facility], [address], [place], [place] [address]    Report Status 01/26/2023 FINAL  Final  Culture, blood (Routine X  2) w Reflex to ID Panel     Status: None   Collection Time: 01/21/23  5:47 AM   Specimen: BLOOD LEFT HAND  Result Value Ref Range Status   Specimen Description BLOOD LEFT HAND  Final   Special Requests   Final    BOTTLES DRAWN AEROBIC AND ANAEROBIC Blood Culture adequate volume   Culture   Final    NO GROWTH 5 DAYS Performed at [facility], [address],  [place], [place] [address]    Report Status 01/26/2023 FINAL  Final  MRSA Next Gen by PCR, Nasal     Status: None   Collection Time: 01/23/23 10:36 PM   Specimen: Nasal Mucosa; Nasal Swab  Result Value Ref Range Status   MRSA by PCR Next Gen NOT DETECTED NOT DETECTED Final    Comment: (NOTE) The GeneXpert MRSA Assay (FDA approved for NASAL specimens only), is one component of a comprehensive MRSA colonization surveillance program. It is not intended to diagnose MRSA infection nor to guide or monitor treatment for MRSA infections. Test performance is not FDA approved in patients less than 44 years old. Performed at [facility], [address], [place], [place] [address]   Aerobic/Anaerobic Culture w Gram Stain (surgical/deep wound)     Status: None (Preliminary result)   Collection Time: 01/24/23  4:27 PM   Specimen: Wound; Body Fluid  Result Value Ref Range Status   Specimen Description FLUID RIGHT KNEE SYNOVIAL  Final   Special Requests SWAB PT ON ANCEF  Final   Gram Stain   Final    FEW WBC PRESENT, PREDOMINANTLY PMN NO ORGANISMS SEEN Performed at [facility], [address], [place], [place] [address]    Culture   Final    RARE ENTEROCOCCUS FAECALIS SUSCEPTIBILITIES TO FOLLOW NO ANAEROBES ISOLATED; CULTURE IN PROGRESS FOR 5 DAYS    Report Status PENDING  Incomplete    Studies/Results: [place] EKG SITE RITE  Result Date: 01/26/2023 If Site Rite image not attached, placement could not be confirmed due to current cardiac rhythm.     Assessment/Plan:  INTERVAL HISTORY: pt has dual lumen picc now   Principal Problem:   Enterococcal bacteremia Active Problems:   Type 2 diabetes mellitus with hyperglycemia (HCC)   Gallstones   Prosthetic valve endocarditis (HCC)   Chronic infection of prosthetic knee (HCC)   Anticoagulated on warfarin   Essential hypertension   Dyslipidemia   Lymphadenopathy   Subacute osteomyelitis of right femur (HCC)   Hardware  complicating wound infection (HCC)    Dennis Bates is a 62 y.o. 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 His TEE was clean. He is sp I and D of knee with exchange.  There was suture material that went down to bone that was removed.    #1 E faecalis bacteremia and PJI  We go with dual beta-lactam therapy to complete 6 weeks with end date on the 24th.  After that we will go to high-dose amoxicillin 1 g every 8 hours   TARIG BARROZO has an appointment on 02/28/2023 at 330PM with Dr. Daiva Eves at  [facility], which  is located in the [facility] at  [address] [place] in [place].  [address], which is located to the left of the elevators.  Phone: [phone]  Fax: [phone]  [email or web address]  The patient should arrive 35  minutes prior to their appoitment.  I have personally spent 52 minutes involved in face-to-face and non-face-to-face activities for this patient on the day of the visit. Professional time spent includes the following activities: Preparing to see the patient (review of tests), Obtaining and/or reviewing separately obtained history (admission/discharge record), Performing a medically appropriate examination and/or evaluation , Ordering medications/tests/procedures, referring and communicating with other health care professionals, Documenting clinical information in the EMR, Independently interpreting results (not separately reported), Communicating results to the patient/family/caregiver, Counseling and educating the patient/family/caregiver and Care coordination (not separately reported).   We will sign off for now.  Please call with further questions.   LOS: 7 days   Acey Lav 01/27/2023, 10:36 AM

## 2023-01-27 NOTE — Progress Notes (Signed)
ANTICOAGULATION CONSULT NOTE  Pharmacy Consult for heparin, warfarin Indication: mechanical aortic valve  Not on File  Patient Measurements: Height: 5\' 7"  (170.2 cm) Weight: 77.3 kg (170 lb 6.7 oz) IBW/kg (Calculated) : 66.1 Heparin Dosing Weight: 77.3  Vital Signs: Temp: 98.9 F (37.2 C) (05/16 0819) Temp Source: Oral (05/16 0819) BP: 148/88 (05/16 0819) Pulse Rate: 64 (05/16 0819)  Labs: Recent Labs    01/25/23 0444 01/25/23 1205 01/26/23 0730 01/27/23 0357  HGB 11.8*  --  10.7* 9.7*  HCT 35.7*  --  32.6* 30.0*  PLT 480*  --  544* 523*  LABPROT 17.6*  --  20.5* 23.3*  INR 1.4*  --  1.7* 2.0*  HEPARINUNFRC  --  0.37 0.38 0.41  CREATININE 0.83  --   --   --      Estimated Creatinine Clearance: 87.4 mL/min (by C-G formula based on SCr of 0.83 mg/dL).  Assessment: 62 yo male with h/o of mechanical aortic valve on warfarin PTA. Admitted with Enterococcus bacteremia. Patient on warfarin 10mg  daily except 15mg  on Mon and Thursdays per HML. Patient's INR was 4 on 5/6 (last dose of warfarin stated on 5/5 prior to admit). INR on admit is 1.8 (goal 2.5-3.5).  Last Warfarin dose 10mg  given while inpatient on 01/20/23.   Patient transitioned to heparin drip while warfarin held 5/10 for knee washout 5/13.  Then back on IV heparin bridge/ Warfarin resumed s/p orthopedic procedure knee washout 5/13. Heparin until INR therapeutic.  Heparin level  remains therapeutic on heparin 2100 units/hr INR  is 2.0 today,  Hgb 9.7 stable,  pltc  523. No bleeding reported.   PTA Warfarin dose:  10mg  daily except 15mg  every Mon and Thurs. Warfarin held 5/10 > 5/12, resumed 5/13 post op. No bleeding noted.   Goal INR listed in anticoag clinic visit notes is 2.5-3.5.   Attending MD is discharging patient home today 01/27/23 INR is 2.0 on Warfarin.  Discharging on home PTA warfarin dose  10mg  daily except 15mg  every Monday and Thursday.  Will give 15 mg dose today here before his discharge after  15:00 today.   Will need follow up of INR check at his anticoag clinic.     Goal of Therapy:  Heparin level 0.3 - 0.7  INR 2.5-3.5   Monitor platelets by anticoagulation protocol: Yes   Plan:  Warfarin 15mg  PO x 1  before discharge today Continue IV heparin 2100 units/hr untild discharge today Patient to  follow up wigh his anticoagulation clinic for INR check.   Thank you for allowing pharmacy to be part of this patients care team.  Dennis Bates, RPh Clinical Pharmacist Please see amion for complete clinical pharmacist phone list 01/27/2023 9:46 AM

## 2023-01-27 NOTE — Progress Notes (Signed)
Subjective: 3 Days Post-Op Procedure(s) (LRB): TOTAL KNEE REVISION (Right) IRRIGATION AND DEBRIDEMENT KNEE (Right) Patient reports pain as mild.  He does have pain with attempts at range of motion of his knee.  He used the CPM machine overnight.  Objective: Vital signs in last 24 hours: Temp:  [97.9 F (36.6 C)-98.8 F (37.1 C)] 98.8 F (37.1 C) (05/16 0400) Pulse Rate:  [64-76] 64 (05/16 0400) Resp:  [16-19] 18 (05/16 0400) BP: (135-191)/(58-75) 135/75 (05/16 0400) SpO2:  [96 %-98 %] 98 % (05/16 0400)  Intake/Output from previous day: 05/15 0701 - 05/16 0700 In: 1210.9 [I.V.:503.6; IV Piggyback:707.3] Out: 700 [Urine:700] Intake/Output this shift: No intake/output data recorded.  Recent Labs    01/25/23 0444 01/26/23 0730 01/27/23 0357  HGB 11.8* 10.7* 9.7*   Recent Labs    01/26/23 0730 01/27/23 0357  WBC 8.2 7.6  RBC 3.73* 3.45*  HCT 32.6* 30.0*  PLT 544* 523*   Recent Labs    01/25/23 0444  NA 134*  K 4.1  CL 98  CO2 25  BUN 9  CREATININE 0.83  GLUCOSE 217*  CALCIUM 8.8*   Recent Labs    01/26/23 0730 01/27/23 0357  INR 1.7* 2.0*  Right knee exam: Dressing is clean and dry.  His calf is soft and nontender.  Has good ankle motion.  He is easily able to do a straight leg raise, his range of motion is quite limited at -5 degrees to about 40 degrees of flexion.  His Hemovac drains were pulled yesterday by Dr. Luiz Blare.   Assessment/Plan: 3 Days Post-Op Procedure(s) (LRB): TOTAL KNEE REVISION (Right) IRRIGATION AND DEBRIDEMENT KNEE (Right) status post septic right knee. Plan: May weight-bear as tolerated with a walker or crutches.  He does not necessarily need the knee immobilizer unless he feels as though his knee will buckle. I will send in Rx for Percocet 5 mg as needed for pain. I will set him up with outpatient physical therapy at our office to work on aggressive range of motion of the right knee.  We appreciate the medical care his received  during this hospitalization.  He will follow-up with Dr. Luiz Blare in the office in 2 weeks.    Matthew Folks 01/27/2023, 8:17 AM

## 2023-01-27 NOTE — Discharge Summary (Signed)
PATIENT DETAILS Name: Dennis Bates Age: 62 y.o. Sex: male Date of Birth: March 19, 1961 MRN: [id number]. Admitting Physician: Jonah Blue, MD WUJ:[id number], Elberta Fortis, MD  Admit Date: 01/20/2023 Discharge date: 01/27/2023  Recommendations for Outpatient Follow-up:  Follow up with PCP in 1-2 weeks Please obtain CMP/CBC in one week Please ensure follow-up with orthopedics, infectious disease.    Admitted From:  Home  Disposition: Home health   Discharge Condition: good  CODE STATUS:   Code Status: Full Code   Diet recommendation:  Diet Order             Diet - low sodium heart healthy           Diet Carb Modified           Diet Carb Modified Fluid consistency: Thin; Room service appropriate? Yes  Diet effective now                    Brief Summary: Patient is a 62 y.o.  male with history of aortic stenosis-s/p AVR (mechanical valve) in 2016, CAD s/p PCI 2016, HLD, HTN, s/p right total knee arthroplasty 2019 who presented with 2-3 weeks history of subjective fever, abdominal pain, back pain, right knee swelling-she was seen at a local urgent care-and had blood cultures drawn which was subsequently positive, patient was then admitted to the hospitalist service for further evaluation and treatment.   Significant events: 5/8>> [facility]-abdominal pain-subjective fever-blood cultures drawn. 5/9>> blood cultures positive for Enterococcus-referred to the ED-admit to [facility].   Significant studies: 5/8>> CT chest/abdomen/pelvis: No consolidation/pneumothorax/effusion-no bowel obstruction.  Gallstones in the nondilated gallbladder 5/8>> CT head: Age-indeterminate bilateral cerebellar infarcts 5/9>> x-ray right knee: Total knee arthroplasty-trace joint fluid 5/9>> echo: EF 55-60%-no obvious vegetation seen on prosthetic aortic valve. 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 Cleared by both orthopedic/infectious disease for discharge today.   History of mechanical aortic valve replacement Coumadin held for orthopedic procedure on 5/13 Back on overlapping IV heparin/Coumadin.   INR back up to 2-resume usual Coumadin dosage on discharge-follow-up with Coumadin clinic.   History of CAD s/p PCI 2016 No anginal symptoms   DM-2 (A1c 7.0 on 5/6) CBG stable on SSI Resume metformin/Jardiance on discharge.  HTN BP stable on Coreg   HLD Statin/Lopid   Cholelithiasis Asymptomatic at this point-primary problem is enterococcal infection-he will require outpatient elective general surgery evaluation for cholecystectomy.   BMI: Estimated body mass index is 26.69 kg/m as calculated from the following:   Height as of this encounter: 5\' 7"  (1.702 m).   Weight as of this encounter: 77.3 kg.   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   Complete by: As directed    [facility] to provide Cath Flo 2mg    Complete by: As directed    Administer for PICC line occlusion and as ordered by physician for other access device issues.   Anaphylaxis Kit: Provided to treat any anaphylactic reaction to the medication being provided to the patient if First Dose or when requested by physician   Complete by: As directed    Epinephrine 1mg /ml vial / amp: Administer 0.3mg  (0.44ml) subcutaneously once for moderate to severe anaphylaxis, nurse to call physician and pharmacy when reaction occurs and call 911 if needed for immediate care   Diphenhydramine 50mg /ml IV vial: Administer 25-50mg  IV/IM PRN for first dose reaction, rash, itching, mild reaction, nurse to call physician and pharmacy when reaction occurs   Sodium Chloride 0.9% NS IV: Administer if needed for hypovolemic blood pressure drop or as ordered by physician after call to physician with anaphylactic reaction   Call MD for:  persistant nausea and vomiting   Complete by: As directed    Call MD for:  severe uncontrolled pain   Complete by: As directed    Change dressing on IV access line weekly and PRN   Complete by: As directed    Diet - low sodium heart healthy   Complete by: As directed    Diet Carb Modified   Complete by: As directed    Discharge instructions   Complete by: As directed    Follow with Primary MD  Kristian Covey, MD in 1-2 weeks  Please get a complete blood count and chemistry panel checked by your Primary MD at your next visit, and again as instructed by your Primary  MD.  Get Medicines reviewed and adjusted: Please take all your medications with you for your next visit with your Primary MD  Laboratory/radiological data: Please request your Primary MD to go over all hospital tests and procedure/radiological results at the follow up, please ask your Primary MD to get all Hospital records sent to his/her office.  In some cases, they will be blood work, cultures and biopsy results pending at the time of your discharge. Please request that your primary care M.D. follows up on these results.  Also Note the following: If you experience worsening of your admission symptoms, develop shortness of breath, life threatening emergency, suicidal or homicidal thoughts you must seek medical attention immediately by calling 911 or calling your MD immediately  if symptoms less severe.  You must read complete instructions/literature along with all the possible adverse reactions/side effects for all the Medicines you take and that have been prescribed to you. Take any new Medicines after you have completely understood and accpet all the possible adverse reactions/side effects.   Do not drive when taking Pain medications or sleeping medications (Benzodaizepines)  Do not take more than prescribed Pain, Sleep and Anxiety Medications. It is not advisable to combine anxiety,sleep and pain medications without talking with your primary care practitioner  Special Instructions: If you have smoked or chewed Tobacco  in the last 2 yrs  please stop smoking, stop any regular Alcohol  and or any Recreational drug use.  Wear Seat belts while driving.  Please note: You were cared for by a hospitalist during your hospital stay. Once you are discharged, your primary care physician will handle any further medical issues. Please note that NO REFILLS for any discharge medications will be authorized once you are discharged, as it is imperative that you return to your primary care physician (or  establish a relationship with a primary care physician if you do not have one) for your post hospital discharge needs so that they can reassess your need for medications and monitor your lab values.   Flush IV access with Sodium Chloride 0.9% and Heparin 10 units/ml or 100 units/ml   Complete by: As directed    Home infusion instructions - [facility]   Complete by: As directed    Instructions: Flush IV access with Sodium Chloride 0.9% and Heparin 10units/ml or 100units/ml   Change dressing on IV access line: Weekly and PRN   Instructions Cath Flo 2mg : Administer for PICC Line occlusion and as ordered by physician for other access device   [facility] pharmacist to adjust dose for: Vancomycin, Aminoglycosides and other anti-infective therapies as requested by physician   Increase activity slowly   Complete by: As directed    weight-bear as tolerated with a walker or crutches.   Method of administration may be changed at the discretion of home infusion pharmacist based upon assessment of the patient and/or caregiver's ability to self-administer the medication ordered   Complete by: As directed    No wound care   Complete by: As directed       Allergies as of 01/27/2023   Not on File      Medication List     STOP taking these medications    traMADol 50 MG tablet Commonly known as: ULTRAM       TAKE these medications    acetaminophen 325 MG tablet Commonly known as: TYLENOL Take 2 tablets (650 mg total) by mouth every 6 (six) hours as needed for mild pain.   ampicillin  IVPB Inject 12 g into the vein daily. As a continuous infusion. 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   carvedilol 3.125 MG tablet Commonly known as: COREG TAKE 1 TABLET TWICE A DAY   cefTRIAXone  IVPB Commonly known as: ROCEPHIN Inject 2 g into the vein every 12 (twelve) hours. 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   gemfibrozil 600 MG tablet Commonly known as: LOPID TAKE 1 TABLET IN THE MORNING AND AT BEDTIME   Jardiance 25 MG Tabs tablet Generic drug: empagliflozin TAKE 1 TABLET DAILY BEFORE BREAKFAST   metFORMIN 1000 MG tablet Commonly known as: GLUCOPHAGE TAKE 1 TABLET TWICE A DAY WITH MEALS  ondansetron 4 MG disintegrating tablet Commonly known as: ZOFRAN-ODT Take 1 tablet (4 mg total) by mouth every 4 (four) hours as needed for nausea or vomiting.   oxyCODONE-acetaminophen 5-325 MG tablet Commonly known as: PERCOCET/ROXICET Take 1-2 tablets by mouth every 6 (six) hours as needed for severe pain.   rosuvastatin 20 MG tablet Commonly known as: CRESTOR TAKE 1 TABLET DAILY AT BEDTIME What changed:  how much to take how to take this when to take this additional instructions   tadalafil 20 MG tablet Commonly known as: CIALIS Take 1 tablet every other day as needed for erectile dysfunction   warfarin 10 MG tablet Commonly known as: COUMADIN Take as directed. If you are unsure how to take this medication, talk to your nurse or doctor. Original instructions: TAKE 1 TABLET DAILY EXCEPT TAKE 1 1/2 TABLETS ON MONDAYS AND THURSDAYS, OR TAKE AS DIRECTED BY ANTICOAGULATION CLINIC What changed:  how much to  take how to take this when to take this               Discharge Care Instructions  (From admission, onward)           Start     Ordered   01/26/23 0000  Change dressing on IV access line weekly and PRN  (Home infusion instructions - [facility] )        01/26/23 1118            Follow-up Information     Jodi Geralds, MD. Schedule an appointment as soon as possible for a visit in 2 week(s).   Specialty: Orthopedic Surgery Contact information: [address] [place] [address] [phone]         Kristian Covey, MD. Schedule an appointment as soon as possible for a visit in 1 week(s).   Specialty: Family Medicine Contact information: [address] [place] [address] [phone]         Daiva Eves, Lisette Grinder, MD Follow up on 02/28/2023.   Specialty: Infectious Diseases Why: appt at 3:30 pm Contact information: [address] [place] [address] [phone]                Not on File   Other Procedures/Studies: [place] EKG SITE RITE  Result Date: 01/26/2023 If Site Rite image not attached, placement could not be confirmed due to current cardiac rhythm.  ECHO TEE  Result Date: 01/21/2023    TRANSESOPHOGEAL ECHO REPORT   Patient Name:   Dennis Bates Date of Exam: 01/21/2023 Medical Rec #:  [id number]        Height:       67.0 in Accession #:    [id number]       Weight:       170.4 lb Date of Birth:  Jan 19, 1961        BSA:          1.889 m Patient Age:    62 years         BP:           123/70 mmHg Patient Gender: M                HR:           81 bpm. Exam Location:  Inpatient Procedure: 2D Echo, Cardiac Doppler and Color Doppler Indications:    Bacteremia  History:        Patient has prior history of Echocardiogram examinations.  Sonographer:    Almira Coaster  Gealow RVT RCS Referring Phys: [id number] Perlie Gold PROCEDURE: After discussion of the risks and benefits of a TEE, an informed consent was obtained from the  patient. The transesophogeal probe was passed without difficulty through the esophogus of the patient. Sedation performed by different physician. The patient developed no complications during the procedure.  IMPRESSIONS  1. No obvious vegetations.  2. The left ventricle has normal function.  3. Right ventricular systolic function is normal. The right ventricular size is normal.  4. No left atrial/left atrial appendage thrombus was detected.  5. The mitral valve is normal in structure. Mild mitral valve regurgitation.  6. S/p AVR (23 mm St Jude mechanical prosthesis; procedure 10/09/2015). Some shadowing present from metal. No obvious vegetations seen. . Aortic valve regurgitation is trivial.  7. There is mild (Grade II) plaque. FINDINGS  Left Ventricle: The left ventricle has normal function. The left ventricular internal cavity size was normal in size. Right Ventricle: The right ventricular size is normal. Right ventricular systolic function is normal. Left Atrium: Left atrial size was normal in size. No left atrial/left atrial appendage thrombus was detected. Right Atrium: Right atrial size was normal in size. Pericardium: There is no evidence of pericardial effusion. Mitral Valve: The mitral valve is normal in structure. Mild mitral valve regurgitation. Tricuspid Valve: The tricuspid valve is normal in structure. Tricuspid valve regurgitation is mild. Aortic Valve: S/p AVR (23 mm St Jude mechanical prosthesis; procedure 10/09/2015). Some shadowing present from metal. No obvious vegetations seen. Aortic valve regurgitation is trivial. Pulmonic Valve: The pulmonic valve was grossly normal. Aorta: There is mild (Grade II) plaque. [name] MD Electronically signed by [name] MD Signature Date/Time: 01/21/2023/5:00:37 PM    Final    EP STUDY  Result Date: 01/21/2023 See surgical note for result.  DG Knee Complete 4 Views Right  Result Date: 01/20/2023 CLINICAL DATA:  Knee pain.  Question infection.   Replacement 2019 EXAM: RIGHT KNEE - COMPLETE 4 VIEW COMPARISON:  None Available. FINDINGS: Total knee arthroplasty identified. Cemented femoral and tibial component. Patellar button. Trace joint fluid. Osteopenia. No fracture or dislocation. No significant lucency seen along the margins of the prosthesis at this time. If there is further concern of infection, a nuclear medicine white blood cell study could be considered. In addition please correlate with any prior imaging to assess for stability of the hardware in the bony structures. IMPRESSION: Total knee arthroplasty. No obvious hardware failure. Trace joint fluid. Further workup can be performed as clinically appropriate Electronically Signed   By: Karen Kays M.D.   On: 01/20/2023 20:01   ECHOCARDIOGRAM COMPLETE  Result Date: 01/20/2023    ECHOCARDIOGRAM REPORT   Patient Name:   Dennis Bates Date of Exam: 01/20/2023 Medical Rec #:  [id number]        Height:       67.0 in Accession #:    [id number]       Weight:       170.4 lb Date of Birth:  1961/02/25        BSA:          1.889 m Patient Age:    62 years         BP:           108/62 mmHg Patient Gender: M                HR:           71 bpm. Exam Location:  Inpatient Procedure:  2D Echo, 3D Echo, Cardiac Doppler and Color Doppler Indications:    Bacteremia  History:        Patient has prior history of Echocardiogram examinations, most                 recent 12/25/2019. CAD and Previous Myocardial Infarction, Aortic                 Valve Disease, Signs/Symptoms:Shortness of Breath, Bacteremia                 and Dyspnea; Risk Factors:Dyslipidemia and Diabetes. Bicuspid                 aortic valve. Aortic stenosis. Bentall procedure.                 Aortic Valve: 23 mm St. Jude mechanical valve is present in the                 aortic position. Procedure Date: 10/09/2015.  Sonographer:    Sheralyn Boatman RDCS Referring Phys: Jonah Blue IMPRESSIONS  1. Right ventricular systolic function is normal. The right  ventricular size is normal. There is mildly elevated pulmonary artery systolic pressure.  2. There is a 23 mm St. Jude Care[employer] information officer series valve present in the aortic position.  3. Left ventricular ejection fraction, by estimation, is 55 to 60%. The left ventricle has normal function. The left ventricle has no regional wall motion abnormalities. There is moderate concentric left ventricular hypertrophy. Left ventricular diastolic parameters are consistent with Grade II diastolic dysfunction (pseudonormalization). Elevated left ventricular end-diastolic pressure.  4. The mitral valve is normal in structure. Mild mitral valve regurgitation. No evidence of mitral stenosis.  5. The inferior vena cava is dilated in size with <50% respiratory variability, suggesting right atrial pressure of 15 mmHg.  6. Aortic root/ascending aorta has been repaired/replaced. FINDINGS  Left Ventricle: Left ventricular ejection fraction, by estimation, is 55 to 60%. The left ventricle has normal function. The left ventricle has no regional wall motion abnormalities. The left ventricular internal cavity size was normal in size. There is  moderate concentric left ventricular hypertrophy. Left ventricular diastolic parameters are consistent with Grade II diastolic dysfunction (pseudonormalization). Elevated left ventricular end-diastolic pressure. Right Ventricle: The right ventricular size is normal. No increase in right ventricular wall thickness. Right ventricular systolic function is normal. There is mildly elevated pulmonary artery systolic pressure. The tricuspid regurgitant velocity is 2.55  m/s, and with an assumed right atrial pressure of 15 mmHg, the estimated right ventricular systolic pressure is 41.0 mmHg. Left Atrium: Left atrial size was normal in size. Right Atrium: Right atrial size was normal in size. Pericardium: There is no evidence of pericardial effusion. Mitral Valve: The mitral valve is normal in structure. Mild  mitral valve regurgitation. No evidence of mitral valve stenosis. MV peak gradient, 8.6 mmHg. The mean mitral valve gradient is 3.0 mmHg. Tricuspid Valve: The tricuspid valve is normal in structure. Tricuspid valve regurgitation is mild . No evidence of tricuspid stenosis. Aortic Valve: Procedure date 10/09/2015/. The aortic valve has been repaired/replaced. Aortic valve regurgitation is not visualized. No aortic stenosis is present. Aortic valve mean gradient measures 14.3 mmHg. Aortic valve peak gradient measures 27.2 mmHg. Aortic valve area, by VTI measures 1.24 cm. There is a 23 mm St. Jude mechanical valve present in the aortic position. Procedure Date: 10/09/2015. Echo findings are consistent with normal structure and function of the aortic valve prosthesis. Pulmonic Valve: The  pulmonic valve was normal in structure. Pulmonic valve regurgitation is not visualized. No evidence of pulmonic stenosis. Aorta: The aortic root/ascending aorta has been repaired/replaced. Venous: The inferior vena cava is dilated in size with less than 50% respiratory variability, suggesting right atrial pressure of 15 mmHg. IAS/Shunts: No atrial level shunt detected by color flow Doppler.  LEFT VENTRICLE PLAX 2D LVIDd:         4.45 cm      Diastology LVIDs:         2.80 cm      LV e' medial:    7.72 cm/s LV PW:         1.20 cm      LV E/e' medial:  16.2 LV IVS:        1.50 cm      LV e' lateral:   7.07 cm/s LVOT diam:     1.90 cm      LV E/e' lateral: 17.7 LV SV:         69 LV SV Index:   36 LVOT Area:     2.84 cm                              3D Volume EF: LV Volumes (MOD)            3D EF:        57 % LV vol d, MOD A2C: 104.0 ml LV EDV:       121 ml LV vol d, MOD A4C: 113.0 ml LV ESV:       51 ml LV vol s, MOD A2C: 49.1 ml  LV SV:        69 ml LV vol s, MOD A4C: 43.6 ml LV SV MOD A2C:     54.9 ml LV SV MOD A4C:     113.0 ml LV SV MOD BP:      65.1 ml RIGHT VENTRICLE            IVC RV S prime:     8.27 cm/s  IVC diam: 2.10 cm TAPSE  (M-mode): 1.6 cm LEFT ATRIUM             Index        RIGHT ATRIUM           Index LA diam:        3.50 cm 1.85 cm/m   RA Area:     17.00 cm LA Vol (A2C):   55.7 ml 29.48 ml/m  RA Volume:   43.40 ml  22.97 ml/m LA Vol (A4C):   37.4 ml 19.80 ml/m LA Biplane Vol: 50.1 ml 26.52 ml/m  AORTIC VALVE AV Area (Vmax):    1.32 cm AV Area (Vmean):   1.26 cm AV Area (VTI):     1.24 cm AV Vmax:           260.67 cm/s AV Vmean:          174.000 cm/s AV VTI:            0.555 m AV Peak Grad:      27.2 mmHg AV Mean Grad:      14.3 mmHg LVOT Vmax:         121.00 cm/s LVOT Vmean:        77.200 cm/s LVOT VTI:          0.243 m LVOT/AV VTI ratio: 0.44  AORTA Ao Root diam: 3.00 cm  Ao Asc diam:  3.20 cm MITRAL VALVE                TRICUSPID VALVE MV Area (PHT): 4.31 cm     TR Peak grad:   26.0 mmHg MV Area VTI:   1.94 cm     TR Vmax:        255.00 cm/s MV Peak grad:  8.6 mmHg MV Mean grad:  3.0 mmHg     SHUNTS MV Vmax:       1.47 m/s     Systemic VTI:  0.24 m MV Vmean:      77.3 cm/s    Systemic Diam: 1.90 cm MV Decel Time: 176 msec MV E velocity: 125.00 cm/s MV A velocity: 50.80 cm/s MV E/A ratio:  2.46 Chilton Si MD Electronically signed by Chilton Si MD Signature Date/Time: 01/20/2023/3:20:55 PM    Final    CT CHEST ABDOMEN PELVIS W CONTRAST  Result Date: 01/19/2023 CLINICAL DATA:  Sepsis.  Abdominal pain for 2 weeks with fever. EXAM: CT CHEST, ABDOMEN, AND PELVIS WITH CONTRAST TECHNIQUE: Multidetector CT imaging of the chest, abdomen and pelvis was performed following the standard protocol during bolus administration of intravenous contrast. RADIATION DOSE REDUCTION: This exam was performed according to the departmental dose-optimization program which includes automated exposure control, adjustment of the mA and/or kV according to patient size and/or use of iterative reconstruction technique. CONTRAST:  85mL OMNIPAQUE IOHEXOL 300 MG/ML  SOLN COMPARISON:  CTA abdomen and pelvis 01/21/2020. Chest CTA 09/24/2015  FINDINGS: CT CHEST FINDINGS Cardiovascular: Heart is nonenlarged. Trace pericardial fluid. Status post median sternotomy with prosthetic aortic valve. Also graft material along the ascending thoracic aorta. Mediastinum/Nodes: Normal caliber thoracic esophagus. Esophagus is mildly patulous. Preserved thyroid gland. No specific abnormal lymph node enlargement identified in the axillary region, hilum or mediastinum. Lungs/Pleura: There is some linear opacity seen along bases likely scar or atelectasis. No consolidation, pneumothorax or effusion. Mild breathing motion. Musculoskeletal: Scattered degenerative changes of the thoracic spine. CT ABDOMEN PELVIS FINDINGS Hepatobiliary: Stones in the nondilated gallbladder. No enhancing liver mass. Patent portal vein. Pancreas: Unremarkable. No pancreatic ductal dilatation or surrounding inflammatory changes. Spleen: Normal in size without focal abnormality. Adrenals/Urinary Tract: The adrenal glands are preserved. No enhancing renal mass or collecting system dilatation. Nonspecific perinephric stranding. Preserved contours of the urinary bladder but the bladder wall slightly thickened. Please correlate with any symptoms. Stomach/Bowel: On this non oral contrast exam, the large bowel has a normal course and caliber with scattered stool. Normal appendix extends medial to the cecum in the right lower quadrant. Second portion duodenal diverticula. Third portion duodenal diverticula also seen. Small bowel overall is nondilated. Vascular/Lymphatic: Aortic atherosclerosis. No enlarged abdominal or pelvic lymph nodes. Retroaortic left renal vein. Reproductive: Prostate is unremarkable. Other: No free air or free fluid. Musculoskeletal: Scattered degenerative changes of the spine and pelvis. There are some areas of canal stenosis along the lower lumbar spine with posterior disc bulging and osteophyte formation. Transitional lumbosacral segment. IMPRESSION: No consolidation,  pneumothorax or effusion. 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 No bowel obstruction, free air or free fluid. Normal appendix. Scattered stool. Gallstones in the nondilated gallbladder. Fatty liver infiltration. Slight wall thickening of the urinary bladder, nonspecific. Please correlate with any symptoms of a subtle cystitis. Electronically Signed   By: Karen Kays M.D.   On: 01/19/2023 10:18   CT Head Wo Contrast  Result Date: 01/19/2023 CLINICAL DATA:  Mental status change  for 2 weeks. EXAM: CT HEAD WITHOUT CONTRAST TECHNIQUE: Contiguous axial images were obtained from the base of the skull through the vertex without intravenous contrast. RADIATION DOSE REDUCTION: This exam was performed according to the departmental dose-optimization program which includes automated exposure control, adjustment of the mA and/or kV according to patient size and/or use of iterative reconstruction technique. COMPARISON:  Head CT 09/14/2013 FINDINGS: Brain: Small low densities in the bilateral cerebellum, convincing even when accounting for motion artifact. No evidence of supratentorial infarct. No hemorrhage, hydrocephalus, collection. Vascular: No hyperdense vessel or unexpected calcification. Skull: Normal. Negative for fracture or focal lesion. Sinuses/Orbits: No acute finding. 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 Motion degraded head CT especially at the ventral brain. Electronically Signed   By: Tiburcio Pea M.D.   On: 01/19/2023 10:08   US Abdomen Complete  Result Date: 01/19/2023 CLINICAL DATA:  Cholelithiasis, elevated lipase, question obstruction EXAM: ABDOMEN ULTRASOUND COMPLETE COMPARISON:  03/19/2022 FINDINGS: Gallbladder: Multiple small shadowing calculi in gallbladder up to 5 mm diameter. Associated sludge. No gallbladder wall thickening, pericholecystic fluid, or sonographic Murphy sign. Common bile duct: Diameter: 6 mm, normal for age  Liver: Heterogeneously increased echogenicity, likely fatty infiltration, though can be seen with cirrhosis and some infiltrative disorders. No mass or nodularity. Portal vein is patent on color Doppler imaging with normal direction of blood flow towards the liver. IVC: Normal appearance Pancreas: Normal appearance.  No mass or ductal dilatation. Spleen: Normal echogenicity. 13.3 cm greatest diameter. No focal abnormalities. Right Kidney: Length: 12.8 cm. Normal cortical thickness and echogenicity. Tiny cyst inferior pole 9 mm diameter, simple features; no follow-up imaging recommended. No additional mass or hydronephrosis. Left Kidney: Length: 12.6 cm. Normal morphology without mass or hydronephrosis. Abdominal aorta: Normal caliber Other findings: No free fluid IMPRESSION: Cholelithiasis and sludge within gallbladder without evidence of acute cholecystitis or biliary dilatation. Probable fatty infiltration of liver as above. Remainder of exam unremarkable. Electronically Signed   By: Ulyses Southward M.D.   On: 01/19/2023 09:02     TODAY-DAY OF DISCHARGE:  Subjective:   Dennis Bates today has no headache,no chest abdominal pain,no new weakness tingling or numbness, feels much better wants to go home today.   Objective:   Blood pressure (!) 148/88, pulse 64, temperature 98.9 F (37.2 C), temperature source Oral, resp. rate 18, height 5\' 7"  (1.702 m), weight 77.3 kg, SpO2 98 %.  Intake/Output Summary (Last 24 hours) at 01/27/2023 0850 Last data filed at 01/27/2023 0400 Gross per 24 hour  Intake 1210.86 ml  Output 700 ml  Net 510.86 ml   Filed Weights   01/20/23 0317 01/20/23 0322  Weight: 77.3 kg 77.3 kg    Exam: Awake Alert, Oriented *3, No new F.N deficits, Normal affect [place].AT,PERRAL Supple Neck,No JVD, No cervical lymphadenopathy appriciated.  Symmetrical Chest wall movement, Good air movement bilaterally, CTAB RRR,No Gallops,Rubs or new Murmurs, No Parasternal Heave +ve B.Sounds, Abd  Soft, Non tender, No organomegaly appriciated, No rebound -guarding or rigidity. No Cyanosis, Clubbing or edema, No new Rash or bruise   PERTINENT RADIOLOGIC STUDIES: [place] EKG SITE RITE  Result Date: 01/26/2023 If Site Rite image not attached, placement could not be confirmed due to current cardiac rhythm.    PERTINENT LAB RESULTS: CBC: Recent Labs    01/26/23 0730 01/27/23 0357  WBC 8.2 7.6  HGB 10.7* 9.7*  HCT 32.6* 30.0*  PLT 544* 523*   CMET CMP     Component Value Date/Time   NA 134 (L) 01/25/2023  0444   NA 136 08/28/2020 1002   K 4.1 01/25/2023 0444   CL 98 01/25/2023 0444   CO2 25 01/25/2023 0444   GLUCOSE 217 (H) 01/25/2023 0444   BUN 9 01/25/2023 0444   BUN 11 08/28/2020 1002   CREATININE 0.83 01/25/2023 0444   CREATININE 0.92 03/14/2020 1035   CREATININE 0.88 02/05/2016 1156   CALCIUM 8.8 (L) 01/25/2023 0444   PROT 6.2 (L) 01/20/2023 0418   PROT 7.2 08/28/2020 1002   ALBUMIN 3.0 (L) 01/20/2023 0418   ALBUMIN 5.0 (H) 08/28/2020 1002   AST 28 01/20/2023 0418   AST 11 (L) 03/14/2020 1035   ALT 26 01/20/2023 0418   ALT 13 03/14/2020 1035   ALKPHOS 41 01/20/2023 0418   BILITOT 1.1 01/20/2023 0418   BILITOT 0.7 08/28/2020 1002   BILITOT 0.6 03/14/2020 1035   GFRNONAA >60 01/25/2023 0444   GFRNONAA >60 03/14/2020 1035   GFRAA 112 08/28/2020 1002   GFRAA >60 03/14/2020 1035    GFR Estimated Creatinine Clearance: 87.4 mL/min (by C-G formula based on SCr of 0.83 mg/dL). No results for input(s): "LIPASE", "AMYLASE" in the last 72 hours. No results for input(s): "CKTOTAL", "CKMB", "CKMBINDEX", "TROPONINI" in the last 72 hours. Invalid input(s): "POCBNP" No results for input(s): "DDIMER" in the last 72 hours. No results for input(s): "HGBA1C" in the last 72 hours. No results for input(s): "CHOL", "HDL", "LDLCALC", "TRIG", "CHOLHDL", "LDLDIRECT" in the last 72 hours. No results for input(s): "TSH", "T4TOTAL", "T3FREE", "THYROIDAB" in the last 72  hours.  Invalid input(s): "FREET3" No results for input(s): "VITAMINB12", "FOLATE", "FERRITIN", "TIBC", "IRON", "RETICCTPCT" in the last 72 hours. Coags: Recent Labs    01/26/23 0730 01/27/23 0357  INR 1.7* 2.0*   Microbiology: Recent Results (from the past 240 hour(s))  Culture, blood (routine x 2)     Status: Abnormal   Collection Time: 01/19/23  9:34 AM   Specimen: BLOOD  Result Value Ref Range Status   Specimen Description   Final    BLOOD LEFT ARM Performed at [facility], [address] [place], [place] [address]    Special Requests   Final    BOTTLES DRAWN AEROBIC AND ANAEROBIC Blood Culture adequate volume Performed at [facility], [address], [place], [place] [address]    Culture  Setup Time   Final    GRAM POSITIVE COCCI IN BOTH AEROBIC AND ANAEROBIC BOTTLES CRITICAL RESULT CALLED TO, READ BACK BY AND VERIFIED WITH: KELLY GIBSON,RN@0039  01/20/23 MK Performed at [facility], [address], [place], [place] [address]    Culture ENTEROCOCCUS FAECALIS (A)  Final   Report Status 01/22/2023 FINAL  Final   Organism ID, Bacteria ENTEROCOCCUS FAECALIS  Final      Susceptibility   Enterococcus faecalis - MIC*    AMPICILLIN <=2 SENSITIVE Sensitive     VANCOMYCIN 1 SENSITIVE Sensitive     GENTAMICIN SYNERGY SENSITIVE Sensitive     * ENTEROCOCCUS FAECALIS  Blood Culture ID Panel (Reflexed)     Status: Abnormal   Collection Time: 01/19/23  9:34 AM  Result Value Ref Range Status   Enterococcus faecalis DETECTED (A) NOT DETECTED Final    Comment: CRITICAL RESULT CALLED TO, READ BACK BY AND VERIFIED WITH: KELLY GIBSON,RN@0040  01/20/23 MK    Enterococcus Faecium NOT DETECTED NOT DETECTED Final   Listeria monocytogenes NOT DETECTED NOT DETECTED Final   Staphylococcus species NOT DETECTED NOT DETECTED Final   Staphylococcus aureus (BCID) NOT DETECTED NOT DETECTED  Final   Staphylococcus epidermidis NOT DETECTED NOT  DETECTED Final   Staphylococcus lugdunensis NOT DETECTED NOT DETECTED Final   Streptococcus species NOT DETECTED NOT DETECTED Final   Streptococcus agalactiae NOT DETECTED NOT DETECTED Final   Streptococcus pneumoniae NOT DETECTED NOT DETECTED Final   Streptococcus pyogenes NOT DETECTED NOT DETECTED Final   A.calcoaceticus-baumannii NOT DETECTED NOT DETECTED Final   Bacteroides fragilis NOT DETECTED NOT DETECTED Final   Enterobacterales NOT DETECTED NOT DETECTED Final   Enterobacter cloacae complex NOT DETECTED NOT DETECTED Final   Escherichia coli NOT DETECTED NOT DETECTED Final   Klebsiella aerogenes NOT DETECTED NOT DETECTED Final   Klebsiella oxytoca NOT DETECTED NOT DETECTED Final   Klebsiella pneumoniae NOT DETECTED NOT DETECTED Final   Proteus species NOT DETECTED NOT DETECTED Final   Salmonella species NOT DETECTED NOT DETECTED Final   Serratia marcescens NOT DETECTED NOT DETECTED Final   Haemophilus influenzae NOT DETECTED NOT DETECTED Final   Neisseria meningitidis NOT DETECTED NOT DETECTED Final   Pseudomonas aeruginosa NOT DETECTED NOT DETECTED Final   Stenotrophomonas maltophilia NOT DETECTED NOT DETECTED Final   Candida albicans NOT DETECTED NOT DETECTED Final   Candida auris NOT DETECTED NOT DETECTED Final   Candida glabrata NOT DETECTED NOT DETECTED Final   Candida krusei NOT DETECTED NOT DETECTED Final   Candida parapsilosis NOT DETECTED NOT DETECTED Final   Candida tropicalis NOT DETECTED NOT DETECTED Final   Cryptococcus neoformans/gattii NOT DETECTED NOT DETECTED Final   Vancomycin resistance NOT DETECTED NOT DETECTED Final    Comment: Performed at [facility], [address], [place], [place] [address]  Culture, blood (routine x 2)     Status: Abnormal   Collection Time: 01/19/23 10:15 AM   Specimen: BLOOD  Result Value Ref Range Status   Specimen Description   Final    BLOOD BLOOD RIGHT FOREARM Performed at [facility], [address], [place], [place] [address]    Special Requests   Final    BOTTLES DRAWN AEROBIC AND ANAEROBIC Blood Culture adequate volume Performed at [facility], [address] [place], [place] [address]    Culture  Setup Time   Final    GRAM POSITIVE COCCI IN CHAINS IN BOTH AEROBIC AND ANAEROBIC BOTTLES CRITICAL VALUE NOTED.  VALUE IS CONSISTENT WITH PREVIOUSLY REPORTED AND CALLED VALUE.    Culture (A)  Final    ENTEROCOCCUS FAECALIS SUSCEPTIBILITIES PERFORMED ON PREVIOUS CULTURE WITHIN THE LAST 5 DAYS. Performed at [facility], [address] [place], [place] [address]    Report Status 01/22/2023 FINAL  Final  Resp panel by RT-PCR (RSV, Flu A&B, Covid) Anterior Nasal Swab     Status: None   Collection Time: 01/19/23 12:08 PM   Specimen: Anterior Nasal Swab  Result Value Ref Range Status   SARS Coronavirus 2 by RT PCR NEGATIVE NEGATIVE Final    Comment: (NOTE) SARS-CoV-2 target nucleic acids are NOT DETECTED.  The SARS-CoV-2 RNA is generally detectable in upper respiratory specimens during the acute phase of infection. The lowest concentration of SARS-CoV-2 viral copies this assay can detect is 138 copies/mL. A negative result does not preclude SARS-Cov-2 infection and should not be used as the sole basis for treatment or other patient management decisions. A negative result may occur with  improper specimen collection/handling, submission of specimen other than nasopharyngeal swab, presence of viral mutation(s) within the areas targeted by this assay, and inadequate number of viral copies(<138 copies/mL). A negative  result must be combined with clinical observations, patient history, and epidemiological information. The expected result is Negative.  Fact Sheet for Patients:  BloggerCourse.com  Fact Sheet for Healthcare Providers:  SeriousBroker.it  This test is no t yet approved or cleared  by the [place] FDA and  has been authorized for detection and/or diagnosis of SARS-CoV-2 by FDA under an Emergency Use Authorization (EUA). This EUA will remain  in effect (meaning this test can be used) for the duration of the COVID-19 declaration under Section 564(b)(1) of the Act, 21 U.S.C.section 360bbb-3(b)(1), unless the authorization is terminated  or revoked sooner.       Influenza A by PCR NEGATIVE NEGATIVE Final   Influenza B by PCR NEGATIVE NEGATIVE Final    Comment: (NOTE) The Xpert Xpress SARS-CoV-2/FLU/RSV plus assay is intended as an aid in the diagnosis of influenza from Nasopharyngeal swab specimens and should not be used as a sole basis for treatment. Nasal washings and aspirates are unacceptable for Xpert Xpress SARS-CoV-2/FLU/RSV testing.  Fact Sheet for Patients: BloggerCourse.com  Fact Sheet for Healthcare Providers: SeriousBroker.it  This test is not yet approved or cleared by the [place] FDA and has been authorized for detection and/or diagnosis of SARS-CoV-2 by FDA under an Emergency Use Authorization (EUA). This EUA will remain in effect (meaning this test can be used) for the duration of the COVID-19 declaration under Section 564(b)(1) of the Act, 21 U.S.C. section 360bbb-3(b)(1), unless the authorization is terminated or revoked.     Resp Syncytial Virus by PCR NEGATIVE NEGATIVE Final    Comment: (NOTE) Fact Sheet for Patients: BloggerCourse.com  Fact Sheet for Healthcare Providers: SeriousBroker.it  This test is not yet approved or cleared by the [place] FDA and has been authorized for detection and/or diagnosis of SARS-CoV-2 by FDA under an Emergency Use Authorization (EUA). This EUA will remain in effect (meaning this test can be used) for the duration of the COVID-19 declaration under Section 564(b)(1) of the Act, 21  U.S.C. section 360bbb-3(b)(1), unless the authorization is terminated or revoked.  Performed at [facility], [address], [place], [place] [address]   Body fluid culture w Gram Stain     Status: None   Collection Time: 01/20/23  7:03 PM   Specimen: Body Fluid  Result Value Ref Range Status   Specimen Description FLUID  Final   Special Requests KNEE  Final   Gram Stain   Final    ABUNDANT WBC PRESENT, PREDOMINANTLY PMN NO ORGANISMS SEEN    Culture   Final    FEW ENTEROCOCCUS FAECALIS CRITICAL RESULT CALLED TO, READ BACK BY AND VERIFIED WITH: RN Oren Beckmann [phone] FCP Performed at [facility], [address], [place], [place] [address]    Report Status 01/23/2023 FINAL  Final   Organism ID, Bacteria ENTEROCOCCUS FAECALIS  Final      Susceptibility   Enterococcus faecalis - MIC*    AMPICILLIN <=2 SENSITIVE Sensitive     VANCOMYCIN 1 SENSITIVE Sensitive     GENTAMICIN SYNERGY SENSITIVE Sensitive     * FEW ENTEROCOCCUS FAECALIS  Culture, blood (Routine X 2) w Reflex to ID Panel     Status: None   Collection Time: 01/21/23  5:46 AM   Specimen: BLOOD RIGHT ARM  Result Value Ref Range Status   Specimen Description BLOOD RIGHT ARM  Final   Special Requests   Final    BOTTLES DRAWN AEROBIC AND ANAEROBIC Blood Culture results may not  be optimal due to an excessive volume of blood received in culture bottles   Culture   Final    NO GROWTH 5 DAYS Performed at [facility], [address] [place], [place] [address]    Report Status 01/26/2023 FINAL  Final  Culture, blood (Routine X 2) w Reflex to ID Panel     Status: None   Collection Time: 01/21/23  5:47 AM   Specimen: BLOOD LEFT HAND  Result Value Ref Range Status   Specimen Description BLOOD LEFT HAND  Final   Special Requests   Final    BOTTLES DRAWN AEROBIC AND ANAEROBIC Blood Culture adequate volume   Culture   Final    NO GROWTH 5 DAYS Performed at [facility], [address], [place], [place] [address]    Report Status 01/26/2023 FINAL  Final  MRSA Next Gen by PCR, Nasal     Status: None   Collection Time: 01/23/23 10:36 PM   Specimen: Nasal Mucosa; Nasal Swab  Result Value Ref Range Status   MRSA by PCR Next Gen NOT DETECTED NOT DETECTED Final    Comment: (NOTE) The GeneXpert MRSA Assay (FDA approved for NASAL specimens only), is one component of a comprehensive MRSA colonization surveillance program. It is not intended to diagnose MRSA infection nor to guide or monitor treatment for MRSA infections. Test performance is not FDA approved in patients less than 10 years old. Performed at [facility], [address] [place], [place] [address]   Aerobic/Anaerobic Culture w Gram Stain (surgical/deep wound)     Status: None (Preliminary result)   Collection Time: 01/24/23  4:27 PM   Specimen: Wound; Body Fluid  Result Value Ref Range Status   Specimen Description FLUID RIGHT KNEE SYNOVIAL  Final   Special Requests SWAB PT ON ANCEF  Final   Gram Stain   Final    FEW WBC PRESENT, PREDOMINANTLY PMN NO ORGANISMS SEEN Performed at [facility], [address] [place], [place] [address]    Culture   Final    RARE ENTEROCOCCUS FAECALIS CULTURE REINCUBATED FOR BETTER GROWTH NO ANAEROBES ISOLATED; CULTURE IN PROGRESS FOR 5 DAYS    Report Status PENDING  Incomplete    FURTHER DISCHARGE INSTRUCTIONS:  Get Medicines reviewed and adjusted: Please take all your medications with you for your next visit with your Primary MD  Laboratory/radiological data: Please request your Primary MD to go over all hospital tests and procedure/radiological results at the follow up, please ask your Primary MD to get all Hospital records sent to his/her office.  In some cases, they will be blood work, cultures and biopsy results pending at the time of your discharge. Please request that your primary care M.D. goes through all the records of your hospital data and  follows up on these results.  Also Note the following: If you experience worsening of your admission symptoms, develop shortness of breath, life threatening emergency, suicidal or homicidal thoughts you must seek medical attention immediately by calling 911 or calling your MD immediately  if symptoms less severe.  You must read complete instructions/literature along with all the possible adverse reactions/side effects for all the Medicines you take and that have been prescribed to you. Take any new Medicines after you have completely understood and accpet all the possible adverse reactions/side effects.   Do not drive when taking Pain medications or sleeping medications (Benzodaizepines)  Do not take more than prescribed Pain, Sleep and  Anxiety Medications. It is not advisable to combine anxiety,sleep and pain medications without talking with your primary care practitioner  Special Instructions: If you have smoked or chewed Tobacco  in the last 2 yrs please stop smoking, stop any regular Alcohol  and or any Recreational drug use.  Wear Seat belts while driving.  Please note: You were cared for by a hospitalist during your hospital stay. Once you are discharged, your primary care physician will handle any further medical issues. Please note that NO REFILLS for any discharge medications will be authorized once you are discharged, as it is imperative that you return to your primary care physician (or establish a relationship with a primary care physician if you do not have one) for your post hospital discharge needs so that they can reassess your need for medications and monitor your lab values.  Total Time spent coordinating discharge including counseling, education and face to face time equals greater than 30 minutes.  SignedJeoffrey Massed 01/27/2023 8:50 AM

## 2023-01-27 NOTE — TOC Transition Note (Signed)
Transition of Care [facility]) - CM/SW Discharge Note   Patient Details  Name: Dennis Bates MRN: [id number] Date of Birth: Aug 16, 1961  [facility] CM/SW Contact:  Gordy Clement, RN Phone Number: 01/27/2023, 11:54 AM   Clinical Narrative:    Patient will dc to home today on IV ABX thru 6/24  [facility] will be providing the meds as well as teaching. Jeri Modena will be here today to meet Wife between 3-4:00 for teaching./  Daughter also had teaching but will not always be available to assist. [facility] will be providing Home Health SN for PICC care and weekly INR beginning Friday 5/17. Wife will transport. No additional  TOC needs       Barriers to Discharge: Continued Medical Work up   Patient Goals and CMS Choice      Discharge Placement                         Discharge Plan and Services Additional resources added to the After Visit Summary for                                       Social Determinants of Health (SDOH) Interventions SDOH Screenings   Food Insecurity: No Food Insecurity (01/20/2023)  Housing: Low Risk  (01/20/2023)  Transportation Needs: No Transportation Needs (01/20/2023)  Utilities: Not At Risk (01/20/2023)  Depression (PHQ2-9): Low Risk  (01/17/2023)  Tobacco Use: Medium Risk (01/25/2023)     Readmission Risk Interventions     No data to display

## 2023-01-27 NOTE — Progress Notes (Signed)
Physical Therapy Treatment Patient Details Name: Dennis Bates MRN: [id number] DOB: 1960/09/23 Today's Date: 01/27/2023   History of Present Illness Pt is a 62 y.o. male admitted to [facility] 01/20/23 following 2-3 weeks hx R knee swelling and R knee, back, and abdominal pain. Pt presented to urgent care and found to be positive for enterococcal bacteremia. Admitted to [facility] for further evaluation. R knee total revision, irregation, and debridment on 5/13. PMH of aortic stenosis-s/p AVR (mechanical valve) in 2016, CAD s/p PCI 2016, HLD, HTN, s/p right total knee arthroplasty 2019.    PT Comments    Pt greeted supine and agreeable to session with continued progress towards acute goals with focus of session on stair training as pt with 2 steps to enter home with no rail. Pt needing grossly min guard assist for bed mobility, transfers and gait with RW support. Pt able to ascend/descend 2 steps x4 trials during session, with  and education provided before each trial. Pt able to ascend descend forwards with min guard with 2 rails, progressing to trials without rail use, pt able to ascend forwards with RW placed sideways with min A to steady and assist to power up, able to ascend 2 steps backwards with min gaurd and cues for sequencing x2 trials. Pt endorsing increased stability with backwards ascent and endorsing 2nd person avaliable to steady RW at home. Pt was educated on continued walker use to maximize functional independence, safety, and decrease risk for falls, safe car entry/exit, and appropriate activity progression. Anticipate safe discharge, with assist level outlined below, once medically cleared, will continue to follow acutely.    Recommendations for follow up therapy are one component of a multi-disciplinary discharge planning process, led by the attending physician.  Recommendations may be updated based on patient status, additional functional criteria and insurance authorization.  Follow Up  Recommendations       Assistance Recommended at Discharge Intermittent Supervision/Assistance  Patient can return home with the following A little help with walking and/or transfers;Assist for transportation;Help with stairs or ramp for entrance   Equipment Recommendations  None recommended by PT    Recommendations for Other Services       Precautions / Restrictions Precautions Precautions: Fall Required Braces or Orthoses: Knee Immobilizer - Right Knee Immobilizer - Right: On when out of bed or walking;Discontinue once straight leg raise with < 10 degree lag Restrictions Weight Bearing Restrictions: Yes RLE Weight Bearing: Weight bearing as tolerated     Mobility  Bed Mobility Overal bed mobility: Needs Assistance Bed Mobility: Supine to Sit, Sit to Supine     Supine to sit: HOB elevated, Min guard     General bed mobility comments: increased time and + bedtrail use, able to complete without assist    Transfers Overall transfer level: Needs assistance Equipment used: Rolling walker (2 wheels) Transfers: Sit to/from Stand, Bed to chair/wheelchair/BSC Sit to Stand: Min guard           General transfer comment: increased time to initiate on first trial, min guard for safety    Ambulation/Gait Ambulation/Gait assistance: Min guard Gait Distance (Feet): 25 Feet Assistive device: Rolling walker (2 wheels) Gait Pattern/deviations: Step-to pattern, Step-through pattern, Antalgic Gait velocity: mildly decreased     General Gait Details: antalgic pattern, distance limited for focus on stair training   Stairs Stairs: Yes Stairs assistance: Min guard, Min assist Stair Management: Two rails, No rails, Step to pattern, Forwards, Backwards, With walker Number of Stairs: 2 (x4) General stair  comments: up/down 2 steps x4 trials, demonstration and education provided before each trial with pt then able demo back. able to ascend descend forwards with min guard with 2  rails, progressing to trials without rail use as pt with no rails to enter home, pt able to ascend forwards with RW placed sideways with min A to steady and assist to power up, able to ascend 2 steps backwards with min gaurd and cues for sequencing x2 trials. pt endorsing increased stability with backwards ascent and endorsing 2nd person avaliable to steady RW at home   Wheelchair Mobility    Modified Rankin (Stroke Patients Only)       Balance Overall balance assessment: Needs assistance Sitting-balance support: No upper extremity supported, Single extremity supported, Feet supported Sitting balance-Leahy Scale: Fair     Standing balance support: Bilateral upper extremity supported, During functional activity, Reliant on assistive device for balance Standing balance-Leahy Scale: Poor Standing balance comment: reliant on UE support                            Cognition Arousal/Alertness: Awake/alert Behavior During Therapy: WFL for tasks assessed/performed Overall Cognitive Status: Within Functional Limits for tasks assessed                                 General Comments: A&Ox4, pleasant during session        Exercises      General Comments General comments (skin integrity, edema, etc.): VSS on RA      Pertinent Vitals/Pain Pain Assessment Pain Assessment: Faces Faces Pain Scale: Hurts little more Pain Location: R knee Pain Descriptors / Indicators: Discomfort, Grimacing, Guarding Pain Intervention(s): Monitored during session, Limited activity within patient's tolerance, Repositioned    Home Living                          Prior Function            PT Goals (current goals can now be found in the care plan section) Acute Rehab PT Goals PT Goal Formulation: With patient/family Time For Goal Achievement: 02/04/23 Progress towards PT goals: Progressing toward goals    Frequency    Min 4X/week      PT Plan Current  plan remains appropriate    Co-evaluation              AM-PAC PT "6 Clicks" Mobility   Outcome Measure  Help needed turning from your back to your side while in a flat bed without using bedrails?: A Little Help needed moving from lying on your back to sitting on the side of a flat bed without using bedrails?: A Little Help needed moving to and from a bed to a chair (including a wheelchair)?: A Little Help needed standing up from a chair using your arms (e.g., wheelchair or bedside chair)?: A Little Help needed to walk in hospital room?: A Little Help needed climbing 3-5 steps with a railing? : A Lot 6 Click Score: 17    End of Session Equipment Utilized During Treatment: Gait belt Activity Tolerance: Patient tolerated treatment well Patient left: with call bell/phone within reach;in chair Nurse Communication: Mobility status PT Visit Diagnosis: Difficulty in walking, not elsewhere classified (R26.2);Other abnormalities of gait and mobility (R26.89)     Time: 6962-9528 PT Time Calculation (min) (ACUTE ONLY): 29 min  Charges:  $  Gait Training: 23-37 mins                     Lenora Boys. PTA Acute Rehabilitation Services Office: [phone]   Catalina Antigua 01/27/2023, 10:26 AM

## 2023-01-28 ENCOUNTER — Telehealth: Payer: Self-pay

## 2023-01-28 LAB — AEROBIC/ANAEROBIC CULTURE W GRAM STAIN (SURGICAL/DEEP WOUND)

## 2023-01-28 LAB — GLUCOSE, CAPILLARY: Glucose-Capillary: 125 mg/dL — ABNORMAL HIGH (ref 70–99)

## 2023-01-28 NOTE — Transitions of Care (Post Inpatient/ED Visit) (Signed)
   01/28/2023  Name: Dennis Bates MRN: [id number] DOB: 1960-11-17  Today's TOC FU Call Status: Today's TOC FU Call Status:: Unsuccessul Call (1st Attempt) Unsuccessful Call (1st Attempt) Date: 01/28/23  Attempted to reach the patient regarding the most recent Inpatient/ED visit.  Follow Up Plan: Additional outreach attempts will be made to reach the patient to complete the Transitions of Care (Post Inpatient/ED visit) call.     Antionette Fairy, RN,BSN,CCM [facility] Care Management Care Management Community Coordinator Direct Phone: [phone] Toll Free: [phone] Fax: [phone]

## 2023-01-28 NOTE — Telephone Encounter (Signed)
Pt has been in hospital and may be discharged soon. Pt will need f/u INR checks. LVM for pt to contact coumadin clinic.  Pt has not been f/u with coumadin clinic or testing INR at home since the end of January due to change in insurance. Have tried to reach pt several times with different types of communication but have not had pt contact coumadin clinic.

## 2023-01-29 LAB — AEROBIC/ANAEROBIC CULTURE W GRAM STAIN (SURGICAL/DEEP WOUND)

## 2023-01-31 ENCOUNTER — Telehealth: Payer: Self-pay

## 2023-01-31 NOTE — Transitions of Care (Post Inpatient/ED Visit) (Signed)
   01/31/2023  Name: Dennis Bates MRN: [id number] DOB: 12-Jul-1961  Today's TOC FU Call Status: Today's TOC FU Call Status:: Unsuccessful Call (3rd Attempt) Unsuccessful Call (3rd Attempt) Date: 01/31/23  Attempted to reach the patient regarding the most recent Inpatient/ED visit.  Follow Up Plan: No further outreach attempts will be made at this time. We have been unable to contact the patient.    Antionette Fairy, RN,BSN,CCM [facility] Care Management Care Management Community Coordinator Direct Phone: [phone] Toll Free: [phone] Fax: [phone]

## 2023-01-31 NOTE — Transitions of Care (Post Inpatient/ED Visit) (Signed)
   01/31/2023  Name: Dennis Bates MRN: [id number] DOB: [birth year]  Today's TOC FU Call Status: Today's TOC FU Call Status:: Unsuccessful Call (2nd Attempt) Unsuccessful Call (2nd Attempt) Date: 01/31/23  Attempted to reach the patient regarding the most recent Inpatient/ED visit.  Follow Up Plan: Additional outreach attempts will be made to reach the patient to complete the Transitions of Care (Post Inpatient/ED visit) call.     Antionette Fairy, RN,BSN,CCM [facility] Care Management Care Management Community Coordinator Direct Phone: [phone] Toll Free: [phone] Fax: [phone]

## 2023-02-01 NOTE — Telephone Encounter (Signed)
LVM for pt to call to schedule coumadin clinic apt.

## 2023-02-03 ENCOUNTER — Encounter: Payer: Self-pay | Admitting: Family Medicine

## 2023-02-03 LAB — LAB REPORT - SCANNED: EGFR: 103

## 2023-02-04 NOTE — Telephone Encounter (Signed)
LVM

## 2023-02-15 ENCOUNTER — Other Ambulatory Visit: Payer: Self-pay | Admitting: Family Medicine

## 2023-02-15 DIAGNOSIS — Z7901 Long term (current) use of anticoagulants: Secondary | ICD-10-CM

## 2023-02-15 NOTE — Telephone Encounter (Signed)
LVM to inform pt INR will be checked on Mondays with POCT machine by [facility] RN.

## 2023-02-15 NOTE — Telephone Encounter (Signed)
Pt has not been compliant with INR checks since change of employers and loss of insurance for 2 months. Pt currently has insurance with new employer but continues not to return calls to coumadin clinic requesting he call to make an apt to check INR.   Have left multiple VM with pt and pt's wife as well as mailed a letter to contact coumadin clinic and sent several mychart msgs. Pt has not responded to any request.   LVM for pt and pt's wife, Mindi Junker.   Will send in warfarin for 30 day supply. Pt needs a coumadin clinic apt to check INR as soon as possible.  Pt also needs a hospital f/u with PCP.   Sent in 30 day supply of warfarin per last coumadin clinic encounter dosing on 10/05/22.    Pt returned call. Reports HH nurse, Allie with [facility] at [phone], has been checking INR weekly with a lab draw. Contacted the number pt gave for Alli, [phone], and her mother answered the phone reporting she is also a [facility] nurse and gave Allie's cell number as [phone].  LVM for Allie.  Last INR result from [facility] is from 5/21 and was 3.1. Advised pt to continue prior dosing, which he reports current dosing as 1 tablet 10 mg) daily except take 1 1/2 tablets (15 mg) on Mondays and Thursdays.   Advised pt this nurse would f/u with [facility] nurse and then f/u with him. Pt also reported he is seeing the knee surgeon on 6/6 to assess if another surgery is necessary. He will f/u with coumadin clinic concerning any surgery.   Received call back from [place], [facility] RN, who reports she has not been drawing INR. She has only been drawing for basic labs and has not had any orders for INR. She does have an anticoag machine and can perform a POCT at visits with pt. She reports she visits pt on Mondays and will be there on 6/10. Gave verbal order to perform POCT INR and call result directly to coumadin clinic on Mondays. Allie verbalized understanding.   Will f/u with PCP when in office next, either tomorrow or  Monday (6/10), concerning pt progress and INR testing.

## 2023-02-16 LAB — LAB REPORT - SCANNED: EGFR: 104

## 2023-02-21 ENCOUNTER — Ambulatory Visit (INDEPENDENT_AMBULATORY_CARE_PROVIDER_SITE_OTHER): Payer: Managed Care, Other (non HMO)

## 2023-02-21 DIAGNOSIS — Z7901 Long term (current) use of anticoagulants: Secondary | ICD-10-CM | POA: Diagnosis not present

## 2023-02-21 LAB — POCT INR: INR: 3.2 — AB (ref 2.0–3.0)

## 2023-02-21 NOTE — Patient Instructions (Addendum)
Pre visit review using our clinic review tool, if applicable. No additional management support is needed unless otherwise documented below in the visit note. ? ?Continue 1 tablet daily except take 1 1/2 on Mondays and Thursdays. Recheck in 1 week.  ?

## 2023-02-21 NOTE — Progress Notes (Signed)
Pt has been d/c from hospital after knee surgery and was d/c on abx, ampicillin and rocephen, IV. HH RN, Brent Bulla [phone], is at pt's home and has an INR of 3.2 for today.  Pt reports he has been taking 1 tablet (10 mg) daily except take 1 1/2 tablets (15 mg) on Mondays and Thursdays. He reports he has been taking this since d/c hospital. Updated calendar to reflect dosing pt is currently taking. Advised continue 1 tablet daily except take 1 1/2 on Mondays and Thursdays. Recheck in 1 week. Both Allie and the pt verbalized understanding.

## 2023-02-22 ENCOUNTER — Other Ambulatory Visit: Payer: Self-pay | Admitting: Family Medicine

## 2023-02-22 ENCOUNTER — Telehealth: Payer: Self-pay | Admitting: Family Medicine

## 2023-02-22 LAB — LAB REPORT - SCANNED: EGFR: 7

## 2023-02-22 MED ORDER — CARVEDILOL 3.125 MG PO TABS: 3.1250 mg | ORAL_TABLET | Freq: Two times a day (BID) | ORAL | 1 refills | Status: DC

## 2023-02-22 MED ORDER — METFORMIN HCL 1000 MG PO TABS: 1000.0000 mg | ORAL_TABLET | Freq: Two times a day (BID) | ORAL | 1 refills | Status: DC

## 2023-02-22 MED ORDER — GEMFIBROZIL 600 MG PO TABS: ORAL_TABLET | ORAL | 1 refills | Status: DC

## 2023-02-22 MED ORDER — EMPAGLIFLOZIN 25 MG PO TABS: 25.0000 mg | ORAL_TABLET | Freq: Every day | ORAL | 1 refills | Status: DC

## 2023-02-22 NOTE — Telephone Encounter (Signed)
Requesting refills  metFORMIN (GLUCOPHAGE) 1000 MG tablet   JARDIANCE 25 MG TABS tablet  carvedilol (COREG) 3.125 MG tablet gemfibrozil (LOPID) 600 MG tablet  [facility] [address] [place], [place] - [address] [phone] [address] Phone: [phone]  Fax: [phone]

## 2023-02-22 NOTE — Telephone Encounter (Signed)
Rx done. 

## 2023-02-23 ENCOUNTER — Telehealth: Payer: Self-pay | Admitting: Family Medicine

## 2023-02-23 NOTE — Telephone Encounter (Signed)
Spoke to [employer] with [employer].   gemfibrozil (LOPID) 600 MG tablet 60 tablet 1 02/23/2023    Sig: TAKE 1 TABLET BY MOUTH IN THE MORNING AND AT BEDTIME   Class: Print   Notes to Pharmacy: Yes.   He has been on both medications for years and recent INR stable.     Read out to her of "notes to pharmacy". Verbalized understanding.

## 2023-02-23 NOTE — Telephone Encounter (Signed)
Requesting a call regarding a interaction between gemfibrozil (LOPID) 600 MG table and warfarin (COUMADIN) 10 MG tablet.

## 2023-02-28 ENCOUNTER — Other Ambulatory Visit: Payer: Self-pay | Admitting: Family Medicine

## 2023-02-28 ENCOUNTER — Encounter: Payer: Self-pay | Admitting: Infectious Disease

## 2023-02-28 ENCOUNTER — Other Ambulatory Visit: Payer: Self-pay

## 2023-02-28 ENCOUNTER — Ambulatory Visit (INDEPENDENT_AMBULATORY_CARE_PROVIDER_SITE_OTHER): Payer: Managed Care, Other (non HMO) | Admitting: Infectious Disease

## 2023-02-28 VITALS — BP 122/75 | HR 88 | Temp 97.1°F | Ht 67.0 in | Wt 160.0 lb

## 2023-02-28 DIAGNOSIS — M86251 Subacute osteomyelitis, right femur: Secondary | ICD-10-CM

## 2023-02-28 DIAGNOSIS — E1169 Type 2 diabetes mellitus with other specified complication: Secondary | ICD-10-CM

## 2023-02-28 DIAGNOSIS — Z7984 Long term (current) use of oral hypoglycemic drugs: Secondary | ICD-10-CM

## 2023-02-28 DIAGNOSIS — R509 Fever, unspecified: Secondary | ICD-10-CM

## 2023-02-28 DIAGNOSIS — I2510 Atherosclerotic heart disease of native coronary artery without angina pectoris: Secondary | ICD-10-CM | POA: Diagnosis not present

## 2023-02-28 DIAGNOSIS — N179 Acute kidney failure, unspecified: Secondary | ICD-10-CM

## 2023-02-28 DIAGNOSIS — Q2381 Bicuspid aortic valve: Secondary | ICD-10-CM

## 2023-02-28 DIAGNOSIS — T847XXD Infection and inflammatory reaction due to other internal orthopedic prosthetic devices, implants and grafts, subsequent encounter: Secondary | ICD-10-CM

## 2023-02-28 DIAGNOSIS — K8020 Calculus of gallbladder without cholecystitis without obstruction: Secondary | ICD-10-CM

## 2023-02-28 DIAGNOSIS — Z9861 Coronary angioplasty status: Secondary | ICD-10-CM

## 2023-02-28 DIAGNOSIS — E119 Type 2 diabetes mellitus without complications: Secondary | ICD-10-CM

## 2023-02-28 DIAGNOSIS — E785 Hyperlipidemia, unspecified: Secondary | ICD-10-CM

## 2023-02-28 DIAGNOSIS — T8453XD Infection and inflammatory reaction due to internal right knee prosthesis, subsequent encounter: Secondary | ICD-10-CM

## 2023-02-28 DIAGNOSIS — T8450XD Infection and inflammatory reaction due to unspecified internal joint prosthesis, subsequent encounter: Secondary | ICD-10-CM

## 2023-02-28 DIAGNOSIS — Z952 Presence of prosthetic heart valve: Secondary | ICD-10-CM

## 2023-02-28 DIAGNOSIS — Q230 Congenital stenosis of aortic valve: Secondary | ICD-10-CM

## 2023-02-28 DIAGNOSIS — R1011 Right upper quadrant pain: Secondary | ICD-10-CM

## 2023-02-28 DIAGNOSIS — Q231 Congenital insufficiency of aortic valve: Secondary | ICD-10-CM

## 2023-02-28 DIAGNOSIS — T8450XA Infection and inflammatory reaction due to unspecified internal joint prosthesis, initial encounter: Secondary | ICD-10-CM

## 2023-02-28 HISTORY — DX: Infection and inflammatory reaction due to unspecified internal joint prosthesis, initial encounter: T84.50XA

## 2023-02-28 LAB — BASIC METABOLIC PANEL
Anion gap: 15 (ref 5–15)
BUN: 14 mg/dL (ref 8–23)
CO2: 27 mmol/L (ref 22–32)
Calcium: 10.2 mg/dL (ref 8.9–10.3)
Chloride: 98 mmol/L (ref 98–111)
Creatinine, Ser: 0.77 mg/dL (ref 0.61–1.24)
GFR, Estimated: >60 mL/min (ref >60)
Glucose, Bld: 217 mg/dL — ABNORMAL HIGH (ref 70–99)
Potassium: 4.3 mmol/L (ref 3.5–5.1)
Sodium: 140 mmol/L (ref 135–145)

## 2023-02-28 NOTE — Progress Notes (Signed)
Subjective:  Chief complaint fatigue and some trouble with word finding recently.    Patient ID: Dennis Bates, male    DOB: March 03, 1961, 62 y.o.   MRN: [id number]  HPI  62 y.o. 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 His TEE was clean. He is sp I and D of knee with exchange.  There was suture material that went down to bone that was removed.   Set up with IV ampicillin via continuous infusion and 2 g of ceftriaxone every 12 hours.  His labs were checked via home health and creatinine was initially normal when measured on February 14, 2023 at 0.72.  Labs done a week later showed a creatinine of 8.26 and a BUN of 51.  I can find no record in our electronic medical record of any phone calls being made to our clinic the patient was not called antibiotics were not adjusted and labs were never repeated.  I am gravely concerned that this does represent acute renal failure with potential need for hemodialysis.  Although he seems fairly lucid today and questioning him he had endorsed initial fatigue and talking to him further said that he has been having trouble remembering words while trying to work.  His knee pain has been doing relatively well though the physical therapist who he have nicknamed the "physical torturers" have noticed a new erythematous area laterally on his right knee.    Past Medical History:  Diagnosis Date   Aortic stenosis due to bicuspid aortic valve 09/2014   Severe aortic stenosis of bicuspid valve - s/pAVR with Bentall ( 09/2015)   Bell's palsy    LAST EPISODE 09-2015   CAD S/P percutaneous coronary angioplasty 09/20/2014   a. inferolat STEMI ->> LHC-Angio: Proximal/ostial LAD 20%, OM2 99% -->> PCI: 3mm x 16 mm Promus Premier DES to the OM2.(~3.5 mm)   Diabetes mellitus type 2, controlled, with complications (HCC)    History of mechanical aortic valve replacement 10/09/2015   after 1 yr DAPT for Inferolateral STEMI. (Dr.  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   Shoulder arthritis 07/11/2020   Rt Severe GH DJD xray Sep 2021   ST elevation myocardial infarction (STEMI) of inferior wall (HCC) 09/20/2014   99 % occluded Cx-OM2 Promus DES 3.0 mm x 16 mm (3.5 mm)    Past Surgical History:  Procedure Laterality Date   BENTALL PROCEDURE N/A 10/09/2015   Procedure: BENTALL PROCEDURE (using a St Jude mechanical valve, size 23);  Surgeon: Alleen Borne, MD;  Location: [facility] OR;  Service: Open Heart Surgery;  Laterality: N/A;  CIRC ARRESTNEEDS RIGHT RADIAL A-LINE   COLONOSCOPY WITH PROPOFOL N/A 01/21/2020   Procedure: COLONOSCOPY WITH PROPOFOL;  Surgeon: Lynann Bologna, MD;  Location: [facility] ENDOSCOPY;  Service: Endoscopy;  Laterality: N/A;   ENTEROSCOPY N/A 01/24/2020   Procedure: ENTEROSCOPY;  Surgeon: Sherrilyn Rist, MD;  Location: [facility] ENDOSCOPY;  Service: Gastroenterology;  Laterality: N/A;   ESOPHAGOGASTRODUODENOSCOPY (EGD) WITH PROPOFOL N/A 01/21/2020   Procedure: ESOPHAGOGASTRODUODENOSCOPY (EGD) WITH PROPOFOL;  Surgeon: Lynann Bologna, MD;  Location: [facility] ENDOSCOPY;  Service: Endoscopy;  Laterality: N/A;   GIVENS CAPSULE STUDY N/A 01/22/2020   Procedure: GIVENS CAPSULE STUDY;  Surgeon: Sherrilyn Rist, MD;  Location: [facility] ENDOSCOPY;  Service: Gastroenterology;  Laterality: N/A;   IRRIGATION AND DEBRIDEMENT KNEE Right 01/24/2023   Procedure: IRRIGATION AND DEBRIDEMENT KNEE;  Surgeon: Jodi Geralds, MD;  Location: [facility] OR;  Service: Orthopedics;  Laterality: Right;   KNEE ARTHROSCOPY W/ ACL RECONSTRUCTION Right    90's; "2 ATHROSCOPIC , 2 REBUILDS"   LEFT HEART CATHETERIZATION WITH CORONARY ANGIOGRAM N/A 09/20/2014   Procedure: LEFT HEART CATHETERIZATION WITH CORONARY ANGIOGRAM;  Surgeon: Marykay Lex, MD;  Location: [facility] CATH LAB;  Proximal/ostial LAD 20%, OM2 99% -->> aspiration thrombectomy and DES PCI:    PERCUTANEOUS CORONARY STENT INTERVENTION  (PCI-S)  09/20/2014   Aspiration thrombectomy and DES PCI- OM 2(m-dCx): Promus Premier DES 3.0 mm x 16 mm (3.5 mm)   RIGHT/LEFT HEART CATH AND CORONARY ANGIOGRAPHY  09/2015   Dr. Nicki Guadalajara: Widely patent OM 2 stent.  Mild disease in RCA and LAD.Relatively normal right heart cath pressures.  Normal cardiac output.   TEE WITHOUT CARDIOVERSION N/A 10/09/2015   Procedure: TRANSESOPHAGEAL ECHOCARDIOGRAM (TEE);  Surgeon: Alleen Borne, MD;  Location: [facility] OR;  Service: Open Heart Surgery;  Laterality: N/A;   TEE WITHOUT CARDIOVERSION N/A 01/21/2023   Procedure: TRANSESOPHAGEAL ECHOCARDIOGRAM;  Surgeon: Pricilla Riffle, MD;  Location: [facility] INVASIVE CV LAB;  Service: Cardiovascular;  Laterality: N/A;   TOTAL KNEE ARTHROPLASTY Right 12/09/2017   Procedure: RIGHT TOTAL KNEE ARTHROPLASTY REMOVAL OF HARDWARE RIGHT KNEE;  Surgeon: Jodi Geralds, MD;  Location: [facility] ORS;  Service: Orthopedics;  Laterality: Right;   TOTAL KNEE REVISION Right 01/24/2023   Procedure: TOTAL KNEE REVISION;  Surgeon: Jodi Geralds, MD;  Location: [facility] OR;  Service: Orthopedics;  Laterality: Right;   TRANSTHORACIC ECHOCARDIOGRAM  02/2016   Post AVR: mechanical: AVR without obstuction. LVEF improved to 65-70%.   TRANSTHORACIC ECHOCARDIOGRAM  09/2014, 10/2014   Probable bicuspid aortic valve: a. mod-sev by 2D ECHO 09/2014. AVA 0.9cm^2; b) 10/2014 Echo:. Severe AS Mn-Pk Gradient 41-71 mmHg, AVA ~0.79 cm2, EF 60-65%   TRANSTHORACIC ECHOCARDIOGRAM  12/2019    Normal WM. Gr 2 DD. Mild LA dilation. 23 mm St. Jude Mechanical AoV well seated.  Trivial AI.     Family History  Problem Relation Age of Onset   Heart disease Mother    CAD Neg Hx        per wife      Social History   Socioeconomic History   Marital status: Married    Spouse name: Not on file   Number of children: Not on file   Years of education: Not on file   Highest education level: Not on file  Occupational History   Occupation: [employer]  Tobacco Use   Smoking status: Never    Smokeless tobacco: Former    Types: Chew    Quit date: 09/01/1985  Vaping Use   Vaping Use: Never used  Substance and Sexual Activity   Alcohol use: Yes    Alcohol/week: 0.0 standard drinks of alcohol    Comment: socially   Drug use: No   Sexual activity: Not on file  Other Topics Concern   Not on file  Social History Narrative   Not on file   Social Determinants of Health   Financial Resource Strain: Not on file  Food Insecurity: No Food Insecurity (01/20/2023)   Hunger Vital Sign    Worried About Running Out of Food in the Last Year: Never true    Ran Out of Food in the Last Year: Never true  Transportation Needs: No Transportation Needs (01/20/2023)   PRAPARE - Administrator, Civil Service (Medical): No    Lack of Transportation (Non-Medical): No  Physical Activity: Not on file  Stress: Not on file  Social Connections: Not on file    No Known Allergies   Current Outpatient Medications:    acetaminophen (TYLENOL) 325 MG tablet, Take 2 tablets (650 mg total) by mouth every 6 (six) hours as needed for mild pain., Disp: , Rfl:    ampicillin IVPB, Inject 12 g into the vein daily. As a continuous infusion. 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  First Dose: Yes Last Day of Therapy:  03/07/23 Labs - Once weekly:  CBC/D and BMP, Labs - Every other week:  ESR and CRP Method of administration: Ambulatory Pump (Continuous Infusion) Method of administration may be changed at the discretion of home infusion pharmacist based upon assessment of the patient and/or caregiver's ability to self-administer the medication ordered., Disp: 41 Units, Rfl: 0   carvedilol (COREG) 3.125 MG tablet, Take 1 tablet (3.125 mg total) by mouth 2 (two) times daily., Disp: 60 tablet, Rfl: 1   cefTRIAXone (ROCEPHIN) IVPB, Inject 2 g into the vein every 12 (twelve) hours. 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  First Dose: Yes Last Day of Therapy:  03/07/23 Labs - Once weekly:   CBC/D and BMP, Labs - Every other week:  ESR and CRP Method of administration: IV Push Method of administration may be changed at the discretion of home infusion pharmacist based upon assessment of the patient and/or caregiver's ability to self-administer the medication ordered., Disp: 82 Units, Rfl: 0   empagliflozin (JARDIANCE) 25 MG TABS tablet, Take 1 tablet (25 mg total) by mouth daily before breakfast., Disp: 30 tablet, Rfl: 1   gemfibrozil (LOPID) 600 MG tablet, TAKE 1 TABLET BY MOUTH IN THE MORNING AND AT BEDTIME, Disp: 60 tablet, Rfl: 1   metFORMIN (GLUCOPHAGE) 1000 MG tablet, Take 1 tablet (1,000 mg total) by mouth 2 (two) times daily with a meal., Disp: 60 tablet, Rfl: 1   oxyCODONE (OXY IR/ROXICODONE) 5 MG immediate release tablet, Take 5 mg by mouth every 6 (six) hours as needed., Disp: , Rfl:    rosuvastatin (CRESTOR) 20 MG tablet, TAKE 1 TABLET DAILY AT BEDTIME (Patient taking differently: Take 20 mg by mouth at bedtime.), Disp: 30 tablet, Rfl: 1   warfarin (COUMADIN) 10 MG tablet, TAKE 1 TABLET BY MOUTH DAILY EXCEPT TAKE 1 AND 1/2 TABS ON MONDAYS OR AS DIRECTED BY ANTICOAGULATION CLINIC/PT NEEDS AN APT TO CHECK INR, Disp: 36 tablet, Rfl: 0   ondansetron (ZOFRAN-ODT) 4 MG disintegrating tablet, Take 1 tablet (4 mg total) by mouth every 4 (four) hours as needed for nausea or vomiting. (Patient not taking: Reported on 02/28/2023), Disp: 20 tablet, Rfl: 0   oxyCODONE-acetaminophen (PERCOCET/ROXICET) 5-325 MG tablet, Take 1-2 tablets by mouth every 6 (six) hours as needed for severe pain. (Patient not taking: Reported on 02/28/2023), Disp: 30 tablet, Rfl: 0   tadalafil (CIALIS) 20 MG tablet, Take 1 tablet every other day as needed for erectile dysfunction, Disp: 6 tablet, Rfl: 11   Review of Systems  Constitutional:  Negative for activity change, appetite change, chills, diaphoresis, fatigue, fever and unexpected weight change.  HENT:  Negative for congestion, rhinorrhea, sinus pressure,  sneezing, sore throat and trouble swallowing.   Eyes:  Negative for photophobia and visual disturbance.  Respiratory:  Negative for cough, chest tightness, shortness of breath, wheezing and stridor.   Cardiovascular:  Negative for chest pain, palpitations and leg swelling.  Gastrointestinal:  Negative for abdominal distention, abdominal pain, anal bleeding, blood in stool, constipation, diarrhea, nausea and  vomiting.  Genitourinary:  Negative for difficulty urinating, dysuria, flank pain and hematuria.  Musculoskeletal:  Negative for arthralgias, back pain, gait problem, joint swelling and myalgias.  Skin:  Positive for color change. Negative for pallor, rash and wound.  Neurological:  Negative for dizziness, tremors, weakness and light-headedness.  Hematological:  Negative for adenopathy. Does not bruise/bleed easily.  Psychiatric/Behavioral:  Negative for agitation, behavioral problems, confusion, decreased concentration, dysphoric mood and sleep disturbance.        Objective:   Physical Exam Constitutional:      Appearance: He is well-developed.  HENT:     Head: Normocephalic and atraumatic.  Eyes:     Conjunctiva/sclera: Conjunctivae normal.  Cardiovascular:     Rate and Rhythm: Normal rate and regular rhythm.  Pulmonary:     Effort: Pulmonary effort is normal. No respiratory distress.     Breath sounds: No wheezing.  Abdominal:     General: There is no distension.     Palpations: Abdomen is soft.  Musculoskeletal:        General: No tenderness. Normal range of motion.     Cervical back: Normal range of motion and neck supple.  Skin:    General: Skin is warm and dry.     Coloration: Skin is not pale.     Findings: No erythema or rash.  Neurological:     General: No focal deficit present.     Mental Status: He is alert and oriented to person, place, and time.  Psychiatric:        Mood and Affect: Mood normal.        Behavior: Behavior normal.        Thought Content:  Thought content normal.        Judgment: Judgment normal.     PICC    Knee           Assessment & Plan:   Potential ARF on labs checked on 02/21/2023 with BUN  51, Cr of 8.26  No one in our office, nor the patient were notified, labs not redrawn, antibiotics not adjusted  I counselled him that from medical-legal and safety standpoint, that the best option with it would be for him to go to the ER though he has had quite a protracted stay there before.  I also offered as an alternative a stat blood draw in our clinic.  If his blood drawl reveals that the prior value was an error or is resolved then he could avoid a trip to the ER though if it is not again floridly abnormal he will need to be admitted to the hospital urgently and may indeed need hemodialysis.  I have ordered stat BMP and will be on the look out for this tonight later  Certainly NOT typical for AMP or CTX to cause ARF  PJI with Enterococcus sp MD and exchange there was suture that was down to bone raising concern of potential bone infection.  We will plan on continuing ampicillin and ceftriaxone unless 1 of these drugs is at fault for causing his renal failure which is fairly unusual.  Hopefully we can eventually transition him to chronic amoxicillin.  TAVR: we erred on side of treating for endocarditis despite the TEE being clean  I have personally spent46 minutes involved in face-to-face and non-face-to-face activities for this patient on the day of the visit. Professional time spent includes the following activities: Preparing to see the patient (review of tests), Obtaining and/or reviewing separately obtained history (admission/discharge  record), Performing a medically appropriate examination and/or evaluation , Ordering medications/tests/procedures, referring and communicating with other health care professionals, Documenting clinical information in the EMR, Independently interpreting results (not separately  reported), Communicating results to the patient/family/caregiver, Counseling and educating the patient/family/caregiver and Care coordination (not separately reported).

## 2023-03-01 ENCOUNTER — Ambulatory Visit (INDEPENDENT_AMBULATORY_CARE_PROVIDER_SITE_OTHER): Payer: Managed Care, Other (non HMO)

## 2023-03-01 DIAGNOSIS — Z7901 Long term (current) use of anticoagulants: Secondary | ICD-10-CM | POA: Diagnosis not present

## 2023-03-01 LAB — POCT INR: INR: 2.4 (ref 2.0–3.0)

## 2023-03-01 NOTE — Patient Instructions (Addendum)
Pre visit review using our clinic review tool, if applicable. No additional management support is needed unless otherwise documented below in the visit note.  Increase dose today to take 1 1/2 tablets and the continue 1 tablet daily except take 1 1/2 on Mondays and Thursdays. Recheck in 1 week.

## 2023-03-01 NOTE — Progress Notes (Signed)
Pt has been d/c from hospital after knee surgery and was d/c on abx, ampicillin and rocephen, IV. HH RN, Brent Bulla [phone], checked INR today and it was 2.4. Advised Allie to recheck in 1 week.  Advised pt to increase dose today to take 1 1/2 tablets and the continue 1 tablet daily except take 1 1/2 on Mondays and Thursdays. Recheck in 1 week. Both Allie and the pt verbalized understanding.

## 2023-03-07 ENCOUNTER — Other Ambulatory Visit: Payer: Self-pay

## 2023-03-07 MED ORDER — AMOXICILLIN 500 MG PO CAPS: 500.0000 mg | ORAL_CAPSULE | Freq: Three times a day (TID) | ORAL | 11 refills | Status: DC

## 2023-03-08 ENCOUNTER — Telehealth: Payer: Self-pay

## 2023-03-08 NOTE — Telephone Encounter (Signed)
03/07/23-Spoke to [facility] on 03/07/23 advised Dr Daiva Eves DID NOT extend IV abx. She said she went to remove his PICC and he refused stating it was extended. I called patient also and discussed plan to go on oral Amox Long Term advised HH will return on 03/08/23 to Pull Picc and he may start oral meds a day after last dose of IV abx. Sent Amox to [employer] [employer]. Joan Flores was given verbal OK to PULL Picc and stated she will return to patient home on 03/08/23 to pull it.   03/08/23- Pam called asking Megan for verbal order to PULL PICC stating RN needed this. I spoke to [facility] and explained that I already gave the verbal and Joan Flores was to go Pull Picc today. Also explained that I did not send [employer] notification because HH already had order to pull picc and that is why she was there with patient but he originally was confused so he refused.

## 2023-03-09 ENCOUNTER — Telehealth: Payer: Self-pay

## 2023-03-09 NOTE — Telephone Encounter (Signed)
Pt was to have [facility] nurse perform INR check on 6/24. Have not received a call from pt or [facility] nurse.  LVM for pt to call to update when INR will be checked.

## 2023-03-11 NOTE — Telephone Encounter (Signed)
LVM for pt to call concerning INR check.

## 2023-03-14 NOTE — Telephone Encounter (Signed)
LVM for pt to call to make apt for INR check.

## 2023-03-19 ENCOUNTER — Other Ambulatory Visit: Payer: Self-pay | Admitting: Family Medicine

## 2023-03-19 DIAGNOSIS — Z7901 Long term (current) use of anticoagulants: Secondary | ICD-10-CM

## 2023-03-21 NOTE — Telephone Encounter (Signed)
Noted.  Dennis Eastham [name] MD [facility]  

## 2023-03-21 NOTE — Telephone Encounter (Signed)
LVM for pt to return call to schedule INR checked. Advised of refill for 14 days and to schedule INR check before 14 days. Received warfarin refill request today. Pt was to have INR check on 6/25 but HH did not go to home to check. Pt has not responded to multiple attempts to schedule INR check. Last refill was for 30 days, will send in refill for 14 days.

## 2023-03-21 NOTE — Telephone Encounter (Signed)
LVM for pt to return call to schedule INR checked. Advised of refill for 14 days and to schedule INR check before 14 days. Received warfarin refill request today. Pt was to have INR check on 6/25 but HH did not go to home to check. Pt has not responded to multiple attempts to schedule INR check. Last refill was for 30 days, will send in refill for 14 days.   Sent in script for 14 day supply.

## 2023-03-23 ENCOUNTER — Ambulatory Visit: Payer: Self-pay

## 2023-03-23 DIAGNOSIS — Z7901 Long term (current) use of anticoagulants: Secondary | ICD-10-CM | POA: Diagnosis not present

## 2023-03-23 LAB — POCT INR: INR: 1.9 — AB (ref 2.0–3.0)

## 2023-03-23 NOTE — Telephone Encounter (Signed)
Contacted pt who reports he did test INR today. Documentation in anticoagulation encounter.

## 2023-03-23 NOTE — Progress Notes (Signed)
Pt tested at home today with his own machine and INR today and it was 1.9 Advised pt to increase dose today to take 1 1/2 tablets and increase dose tomorrow to take 1 1/2 tablets and then change weekly dose to take 1 tablet daily except take 1 1/2 tablets on Monday, Wednesday and Friday. Recheck in 2 week. Pt read back instructions and verbalized understanding.

## 2023-03-23 NOTE — Patient Instructions (Addendum)
Pre visit review using our clinic review tool, if applicable. No additional management support is needed unless otherwise documented below in the visit note.  Increase dose tomorrow to take 1 1/2 tablets and then change weekly dose to take 1 tablet daily except take 1 1/2 tablets on Monday, Wednesday and Friday. Recheck in 2 week.

## 2023-04-02 NOTE — Progress Notes (Signed)
Labs from 02/01/2023 Na+ 135, K+ 4.5, Cl- 88, HCO3-18, BUN 14, Cr 0.74, Glu 181, Ca2+ 9.9 CBC: W 8.7, H/H 10.7/32.3, Plt 698; INR 3.1.   Bryan Lemma, MD

## 2023-04-06 LAB — POCT INR: INR: 2.7 (ref 2.0–3.0)

## 2023-04-08 ENCOUNTER — Telehealth: Payer: Self-pay

## 2023-04-08 ENCOUNTER — Ambulatory Visit (INDEPENDENT_AMBULATORY_CARE_PROVIDER_SITE_OTHER): Payer: Managed Care, Other (non HMO)

## 2023-04-08 ENCOUNTER — Telehealth: Payer: Self-pay | Admitting: Family Medicine

## 2023-04-08 DIAGNOSIS — Z7901 Long term (current) use of anticoagulants: Secondary | ICD-10-CM | POA: Diagnosis not present

## 2023-04-08 MED ORDER — WARFARIN SODIUM 10 MG PO TABS
ORAL_TABLET | ORAL | 0 refills | Status: DC
Start: 2023-04-08 — End: 2023-07-14

## 2023-04-08 MED ORDER — ROSUVASTATIN CALCIUM 20 MG PO TABS: ORAL_TABLET | ORAL | 0 refills | Status: DC

## 2023-04-08 NOTE — Telephone Encounter (Signed)
Rx sent and patient informed to follow up with our office for additional refills.

## 2023-04-08 NOTE — Telephone Encounter (Signed)
Pt is overdue to check INR. LVM requesting pt test INR today.

## 2023-04-08 NOTE — Telephone Encounter (Signed)
Prescription Request  04/08/2023  LOV: Visit date not found  What is the name of the medication or equipment? rosuvastatin (CRESTOR) 20 MG tablet  Have you contacted your pharmacy to request a refill? No   Which pharmacy would you like this sent to?  [facility] [address] [place], [place] - [address] [address] [place] [address] Phone: [phone] Fax: [phone]    Patient notified that their request is being sent to the clinical staff for review and that they should receive a response within 2 business days.   Please advise at Mobile [phone] (mobile)

## 2023-04-08 NOTE — Progress Notes (Signed)
Pt tested at home today with his own machine and INR today and it was 2.7 Continue 1 tablet daily except take 1 1/2 tablets on Monday, Wednesday and Friday. Recheck in 4 week. Had to update calendar due to not being updated with new dosing with last INR check. So, no change in dosing was made, only calendar updated. Verified dosing with pt. Pt read back dosing instructions and will retest in 4 weeks. Pt verbalized understanding.    Pt requested refill. Sent in refill.

## 2023-04-08 NOTE — Patient Instructions (Addendum)
Pre visit review using our clinic review tool, if applicable. No additional management support is needed unless otherwise documented below in the visit note.  Continue 1 tablet daily except take 1 1/2 tablets on Monday, Wednesday and Friday. Recheck in 4 week.

## 2023-04-08 NOTE — Telephone Encounter (Signed)
Pt returned call. INR result documented in anticoagulation encounter from today.

## 2023-04-08 NOTE — Addendum Note (Signed)
Addended by: Christy Sartorius on: 04/08/2023 03:21 PM   Modules accepted: Orders

## 2023-04-11 ENCOUNTER — Telehealth: Payer: Self-pay | Admitting: Family Medicine

## 2023-04-11 NOTE — Telephone Encounter (Signed)
Matt pharmacist with [facility] informed of the message below and voiced understanding

## 2023-04-11 NOTE — Telephone Encounter (Addendum)
[facility] [address] [place], [place] - [address] [address] [id number] [address] Phone: [phone]  Fax: [phone]     Called to say there may be a -  Possible Drug Interaction: Rosuvastatin & other medications  Please call at your earliest convenience to discuss.

## 2023-04-11 NOTE — Telephone Encounter (Signed)
I spoke with the pharmacist at [facility] and he reported potential drug interaction between Rosuvastatin and Warfarin with Gemfibrozil.

## 2023-04-21 ENCOUNTER — Other Ambulatory Visit: Payer: Self-pay | Admitting: Family Medicine

## 2023-04-24 ENCOUNTER — Other Ambulatory Visit: Payer: Self-pay | Admitting: Family Medicine

## 2023-04-24 DIAGNOSIS — K8020 Calculus of gallbladder without cholecystitis without obstruction: Secondary | ICD-10-CM

## 2023-04-26 NOTE — Telephone Encounter (Signed)
Rx done. 

## 2023-05-02 ENCOUNTER — Other Ambulatory Visit: Payer: Self-pay | Admitting: Family Medicine

## 2023-05-10 ENCOUNTER — Telehealth: Payer: Self-pay

## 2023-05-10 NOTE — Telephone Encounter (Signed)
LVM it is time to test INR

## 2023-05-11 NOTE — Telephone Encounter (Signed)
LVM

## 2023-05-13 NOTE — Telephone Encounter (Signed)
LVM advising time to test INR. Advised there will not be a coumadin clinic nurse in the office next week.

## 2023-05-17 ENCOUNTER — Other Ambulatory Visit: Payer: Self-pay | Admitting: Family Medicine

## 2023-05-20 ENCOUNTER — Other Ambulatory Visit: Payer: Self-pay

## 2023-05-20 DIAGNOSIS — Z7901 Long term (current) use of anticoagulants: Secondary | ICD-10-CM

## 2023-05-23 NOTE — Telephone Encounter (Signed)
LVM

## 2023-05-23 NOTE — Telephone Encounter (Signed)
Last script sent on 7/26 for # 135, so pt should not need a refill yet.  Contacted [facility] pharmacy who reports they only dispensed 109 tablets because that is what they figured is a 90 day supply. Advised it was sent for #135 and that is what  pt should have received. She said she will fill the remainder of the script sent on 7/26, which will be about 27 tablets.   No new script sent in.

## 2023-05-25 ENCOUNTER — Ambulatory Visit (INDEPENDENT_AMBULATORY_CARE_PROVIDER_SITE_OTHER): Payer: Managed Care, Other (non HMO)

## 2023-05-25 DIAGNOSIS — Z7901 Long term (current) use of anticoagulants: Secondary | ICD-10-CM

## 2023-05-25 LAB — POCT INR: INR: 4 — AB (ref 2.0–3.0)

## 2023-05-25 NOTE — Telephone Encounter (Signed)
LVM

## 2023-05-25 NOTE — Progress Notes (Signed)
I have reviewed and agree with note, evaluation, plan.   Stephen Hunter, MD  

## 2023-05-25 NOTE — Patient Instructions (Signed)
Pre visit review using our clinic review tool, if applicable. No additional management support is needed unless otherwise documented below in the visit note.  Hold dose today and then change weekly dose to take 1 tablet daily except take 1 1/2 tablets on Monday and Friday. Recheck in 2 week.

## 2023-05-25 NOTE — Progress Notes (Signed)
Pt denies any changes but is still taking amoxicillin TID since knee surgery. Pt tested at home today with his own machine and INR today and it was 4.0 Hold dose today and then change weekly dose to take 1 tablet daily except take 1 1/2 tablets on Monday and Friday. Recheck in 2 week. Advised pt of dosing changes and recheck. Pt read back dosing instructions and will retest in 2 weeks. Pt verbalized understanding.

## 2023-06-01 ENCOUNTER — Other Ambulatory Visit: Payer: Self-pay | Admitting: Infectious Disease

## 2023-06-09 ENCOUNTER — Telehealth: Payer: Self-pay

## 2023-06-09 NOTE — Telephone Encounter (Signed)
Pt is due to test INR. LVM requesting he test.

## 2023-06-10 ENCOUNTER — Ambulatory Visit (INDEPENDENT_AMBULATORY_CARE_PROVIDER_SITE_OTHER): Payer: Managed Care, Other (non HMO)

## 2023-06-10 DIAGNOSIS — Z7901 Long term (current) use of anticoagulants: Secondary | ICD-10-CM | POA: Diagnosis not present

## 2023-06-10 LAB — POCT INR: INR: 3 (ref 2.0–3.0)

## 2023-06-10 NOTE — Patient Instructions (Addendum)
Pre visit review using our clinic review tool, if applicable. No additional management support is needed unless otherwise documented below in the visit note.  Continue 1 tablet daily except take 1 1/2 tablets on Monday and Friday. Recheck in 3 week, on 10/16.

## 2023-06-10 NOTE — Telephone Encounter (Signed)
Patient called back and said his result is 3.0. Best callback is [phone].

## 2023-06-10 NOTE — Progress Notes (Signed)
Pt denies any changes but is still taking amoxicillin TID since knee surgery. Pt tested at home today with his own machine and INR today and it was 4.0 Continue 1 tablet daily except take 1 1/2 tablets on Monday and Friday. Recheck in 3 week. Advised pt of dosing changes and recheck. Pt read back dosing instructions and will retest in 3 weeks. Pt verbalized understanding.

## 2023-06-10 NOTE — Telephone Encounter (Signed)
Pt reports he will test this morning.

## 2023-06-10 NOTE — Telephone Encounter (Signed)
Pt has been contacted and advised of warfarin dosing instructions.

## 2023-06-26 ENCOUNTER — Other Ambulatory Visit: Payer: Self-pay | Admitting: Family Medicine

## 2023-06-30 ENCOUNTER — Telehealth: Payer: Self-pay

## 2023-06-30 NOTE — Telephone Encounter (Signed)
Time for pt to test INR. LVM to test.

## 2023-07-01 ENCOUNTER — Ambulatory Visit (INDEPENDENT_AMBULATORY_CARE_PROVIDER_SITE_OTHER): Payer: Self-pay

## 2023-07-01 DIAGNOSIS — Z7901 Long term (current) use of anticoagulants: Secondary | ICD-10-CM

## 2023-07-01 LAB — POCT INR: INR: 3.4 — AB (ref 2.0–3.0)

## 2023-07-01 NOTE — Patient Instructions (Addendum)
Pre visit review using our clinic review tool, if applicable. No additional management support is needed unless otherwise documented below in the visit note.  Continue 1 tablet daily except take 1 1/2 tablets on Monday and Friday. Recheck in 3 week.

## 2023-07-01 NOTE — Progress Notes (Signed)
Pt denies any changes but is still taking amoxicillin TID since knee surgery. Pt tested at home today with his own machine and INR today and it was 3.4 Continue 1 tablet daily except take 1 1/2 tablets on Monday and Friday. Recheck in 3 week. Advised pt of dosing changes and recheck. Pt read back dosing instructions and will retest in 3 weeks. Pt verbalized understanding.

## 2023-07-07 ENCOUNTER — Other Ambulatory Visit: Payer: Self-pay | Admitting: Family Medicine

## 2023-07-14 ENCOUNTER — Telehealth: Payer: Self-pay

## 2023-07-14 DIAGNOSIS — Z7901 Long term (current) use of anticoagulants: Secondary | ICD-10-CM

## 2023-07-14 MED ORDER — WARFARIN SODIUM 10 MG PO TABS
ORAL_TABLET | ORAL | 0 refills | Status: DC
Start: 2023-07-14 — End: 2023-10-25

## 2023-07-14 NOTE — Telephone Encounter (Signed)
Received fax from [place] pharmacy, requesting warfarin refill.  Pt is due for apt with PCP. Will send in refill with note that pt needs apt with PCP.

## 2023-07-22 ENCOUNTER — Ambulatory Visit (INDEPENDENT_AMBULATORY_CARE_PROVIDER_SITE_OTHER): Payer: Managed Care, Other (non HMO)

## 2023-07-22 DIAGNOSIS — Z7901 Long term (current) use of anticoagulants: Secondary | ICD-10-CM

## 2023-07-22 LAB — POCT INR: INR: 3.1 — AB (ref 2.0–3.0)

## 2023-07-22 NOTE — Patient Instructions (Addendum)
Pre visit review using our clinic review tool, if applicable. No additional management support is needed unless otherwise documented below in the visit note.  Continue 1 tablet daily except take 1 1/2 tablets on Monday and Friday. Recheck in 4 week.

## 2023-07-22 NOTE — Progress Notes (Signed)
Pt denies any changes but is still taking amoxicillin TID since knee surgery. Pt tested at home today with his own machine and INR today and it was 3.1 Continue 1 tablet daily except take 1 1/2 tablets on Monday and Friday. Recheck in 4 week. LVM with dosing instructions and that pt also needs PCP apt.

## 2023-07-23 ENCOUNTER — Other Ambulatory Visit: Payer: Self-pay | Admitting: Family Medicine

## 2023-08-03 ENCOUNTER — Other Ambulatory Visit: Payer: Self-pay | Admitting: Family Medicine

## 2023-08-03 DIAGNOSIS — K8020 Calculus of gallbladder without cholecystitis without obstruction: Secondary | ICD-10-CM

## 2023-08-17 ENCOUNTER — Ambulatory Visit: Payer: Managed Care, Other (non HMO) | Admitting: Family Medicine

## 2023-08-17 ENCOUNTER — Ambulatory Visit: Payer: Managed Care, Other (non HMO)

## 2023-08-22 ENCOUNTER — Ambulatory Visit: Payer: Managed Care, Other (non HMO) | Admitting: Family Medicine

## 2023-08-24 ENCOUNTER — Telehealth: Payer: Self-pay

## 2023-08-24 ENCOUNTER — Telehealth: Payer: Self-pay | Admitting: Cardiology

## 2023-08-24 ENCOUNTER — Ambulatory Visit: Payer: Managed Care, Other (non HMO)

## 2023-08-24 ENCOUNTER — Ambulatory Visit: Payer: Managed Care, Other (non HMO) | Admitting: Family Medicine

## 2023-08-24 ENCOUNTER — Encounter: Payer: Self-pay | Admitting: Family Medicine

## 2023-08-24 VITALS — BP 160/84 | HR 70 | Temp 97.7°F | Ht 67.0 in | Wt 181.4 lb

## 2023-08-24 DIAGNOSIS — I10 Essential (primary) hypertension: Secondary | ICD-10-CM | POA: Diagnosis not present

## 2023-08-24 DIAGNOSIS — E1165 Type 2 diabetes mellitus with hyperglycemia: Secondary | ICD-10-CM

## 2023-08-24 DIAGNOSIS — Z7984 Long term (current) use of oral hypoglycemic drugs: Secondary | ICD-10-CM | POA: Diagnosis not present

## 2023-08-24 DIAGNOSIS — E1169 Type 2 diabetes mellitus with other specified complication: Secondary | ICD-10-CM

## 2023-08-24 DIAGNOSIS — Z7901 Long term (current) use of anticoagulants: Secondary | ICD-10-CM

## 2023-08-24 DIAGNOSIS — E785 Hyperlipidemia, unspecified: Secondary | ICD-10-CM | POA: Diagnosis not present

## 2023-08-24 DIAGNOSIS — D500 Iron deficiency anemia secondary to blood loss (chronic): Secondary | ICD-10-CM | POA: Diagnosis not present

## 2023-08-24 DIAGNOSIS — R079 Chest pain, unspecified: Secondary | ICD-10-CM

## 2023-08-24 LAB — POCT INR: INR: 3.2 — AB (ref 2.0–3.0)

## 2023-08-24 MED ORDER — EMPAGLIFLOZIN 25 MG PO TABS: 25.0000 mg | ORAL_TABLET | Freq: Every day | ORAL | 11 refills | Status: DC

## 2023-08-24 NOTE — Progress Notes (Unsigned)
Established Patient Office Visit  Subjective   Patient ID: Dennis Bates, male    DOB: Jun 17, 1961  Age: 62 y.o. MRN: [id number]  No chief complaint on file.   HPI  {History (Optional):23778} Dennis Bates seen today for medical follow-up.  I have not seen him in over a year.Marland Kitchen  He did apparently started a new job within the past year which has made follow-up difficult for him.  He has complicated past medical history with history of aortic root dilatation, bicuspid aortic valve, CAD, gallstones, type 2 diabetes, hyperlipidemia, osteoarthritis, chronic anticoagulation.  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  Had to have hardware removed eventually.  He had positive blood culture for Enterococcus.  Still remains on amoxicillin.  Followed by ID.  He admission back in May.  He states he has been out of his Jardiance for over 2 months and needs refills.  No recent A1c.  Last A1c was 7.0% last May.  He remains on metformin and as above has been out of Jardiance for 2 months now.  He states this morning when he was at work he had some left substernal chest heaviness also last only about a minute but did have some diaphoresis and shortness of breath which lasted about an hour.  Some left upper extremity numbness.  Has had none whatsoever since then.  No recent exertional chest discomfort.  He has scheduled follow-up with cardiology tomorrow.  Denied any upper extremity weakness.  No speech change.  No lower extremity weakness.  Denies any chest discomfort whatsoever at this time.  Past Medical History:  Diagnosis Date   Aortic stenosis due to bicuspid aortic valve 09/2014   Severe aortic stenosis of bicuspid valve - s/pAVR with Bentall ( 09/2015)   Bell's palsy    LAST EPISODE 09-2015   CAD S/P percutaneous coronary angioplasty 09/20/2014   a. inferolat STEMI ->> LHC-Angio:  Proximal/ostial LAD 20%, OM2 99% -->> PCI: 3mm x 16 mm Promus Premier DES to the OM2.(~3.5 mm)   Diabetes mellitus type 2, controlled, with complications (HCC)    History of mechanical aortic valve replacement 10/09/2015   after 1 yr DAPT for Inferolateral STEMI. (Dr. 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   Shoulder arthritis 07/11/2020   Rt Severe GH DJD xray Sep 2021   ST elevation myocardial infarction (STEMI) of inferior wall (HCC) 09/20/2014   99 % occluded Cx-OM2 Promus DES 3.0 mm x 16 mm (3.5 mm)   Past Surgical History:  Procedure Laterality Date   BENTALL PROCEDURE N/A 10/09/2015   Procedure: BENTALL PROCEDURE (using a St Jude mechanical valve, size 23);  Surgeon: Alleen Borne, MD;  Location: [facility] OR;  Service: Open Heart Surgery;  Laterality: N/A;  CIRC ARRESTNEEDS RIGHT RADIAL A-LINE   COLONOSCOPY WITH PROPOFOL N/A 01/21/2020   Procedure: COLONOSCOPY WITH PROPOFOL;  Surgeon: Lynann Bologna, MD;  Location: [facility] ENDOSCOPY;  Service: Endoscopy;  Laterality: N/A;   ENTEROSCOPY N/A 01/24/2020   Procedure: ENTEROSCOPY;  Surgeon: Sherrilyn Rist, MD;  Location: [facility] ENDOSCOPY;  Service: Gastroenterology;  Laterality: N/A;   ESOPHAGOGASTRODUODENOSCOPY (EGD) WITH PROPOFOL N/A 01/21/2020   Procedure: ESOPHAGOGASTRODUODENOSCOPY (EGD) WITH PROPOFOL;  Surgeon: Lynann Bologna, MD;  Location: [facility] ENDOSCOPY;  Service: Endoscopy;  Laterality: N/A;   GIVENS CAPSULE STUDY N/A 01/22/2020   Procedure:  GIVENS CAPSULE STUDY;  Surgeon: Sherrilyn Rist, MD;  Location: [facility] ENDOSCOPY;  Service: Gastroenterology;  Laterality: N/A;   IRRIGATION AND DEBRIDEMENT KNEE Right 01/24/2023   Procedure: IRRIGATION AND DEBRIDEMENT KNEE;  Surgeon: Jodi Geralds, MD;  Location: [facility] OR;  Service: Orthopedics;  Laterality: Right;   KNEE ARTHROSCOPY W/ ACL RECONSTRUCTION Right    90's; "2 ATHROSCOPIC , 2 REBUILDS"   LEFT HEART CATHETERIZATION WITH CORONARY  ANGIOGRAM N/A 09/20/2014   Procedure: LEFT HEART CATHETERIZATION WITH CORONARY ANGIOGRAM;  Surgeon: Marykay Lex, MD;  Location: [facility] CATH LAB;  Proximal/ostial LAD 20%, OM2 99% -->> aspiration thrombectomy and DES PCI:    PERCUTANEOUS CORONARY STENT INTERVENTION (PCI-S)  09/20/2014   Aspiration thrombectomy and DES PCI- OM 2(m-dCx): Promus Premier DES 3.0 mm x 16 mm (3.5 mm)   RIGHT/LEFT HEART CATH AND CORONARY ANGIOGRAPHY  09/2015   Dr. Nicki Guadalajara: Widely patent OM 2 stent.  Mild disease in RCA and LAD.Relatively normal right heart cath pressures.  Normal cardiac output.   TEE WITHOUT CARDIOVERSION N/A 10/09/2015   Procedure: TRANSESOPHAGEAL ECHOCARDIOGRAM (TEE);  Surgeon: Alleen Borne, MD;  Location: [facility] OR;  Service: Open Heart Surgery;  Laterality: N/A;   TEE WITHOUT CARDIOVERSION N/A 01/21/2023   Procedure: TRANSESOPHAGEAL ECHOCARDIOGRAM;  Surgeon: Pricilla Riffle, MD;  Location: [facility] INVASIVE CV LAB;  Service: Cardiovascular;  Laterality: N/A;   TOTAL KNEE ARTHROPLASTY Right 12/09/2017   Procedure: RIGHT TOTAL KNEE ARTHROPLASTY REMOVAL OF HARDWARE RIGHT KNEE;  Surgeon: Jodi Geralds, MD;  Location: [facility] ORS;  Service: Orthopedics;  Laterality: Right;   TOTAL KNEE REVISION Right 01/24/2023   Procedure: TOTAL KNEE REVISION;  Surgeon: Jodi Geralds, MD;  Location: [facility] OR;  Service: Orthopedics;  Laterality: Right;   TRANSTHORACIC ECHOCARDIOGRAM  02/2016   Post AVR: mechanical: AVR without obstuction. LVEF improved to 65-70%.   TRANSTHORACIC ECHOCARDIOGRAM  09/2014, 10/2014   Probable bicuspid aortic valve: a. mod-sev by 2D ECHO 09/2014. AVA 0.9cm^2; b) 10/2014 Echo:. Severe AS Mn-Pk Gradient 41-71 mmHg, AVA ~0.79 cm2, EF 60-65%   TRANSTHORACIC ECHOCARDIOGRAM  12/2019    Normal WM. Gr 2 DD. Mild LA dilation. 23 mm St. Jude Mechanical AoV well seated.  Trivial AI.     reports that he has never smoked. He quit smokeless tobacco use about 38 years ago.  His smokeless tobacco use included chew. He reports  current alcohol use. He reports that he does not use drugs. family history includes Heart disease in his mother. No Known Allergies  Review of Systems  Constitutional:  Negative for chills, fever and malaise/fatigue.  Eyes:  Negative for blurred vision.  Respiratory:  Negative for shortness of breath.   Cardiovascular:  Negative for leg swelling and PND.       See HPI  Gastrointestinal:  Negative for abdominal pain.  Neurological:  Negative for dizziness, weakness and headaches.      Objective:     BP (!) 160/84 (BP Location: Left Arm, Patient Position: Sitting, Cuff Size: Normal)   Pulse 70   Temp 97.7 F (36.5 C) (Oral)   Ht 5\' 7"  (1.702 m)   Wt 181 lb 6.4 oz (82.3 kg)   SpO2 98%   BMI 28.41 kg/m  BP Readings from Last 3 Encounters:  08/24/23 (!) 160/84  02/28/23 122/75  01/27/23 131/75   Wt Readings from Last 3 Encounters:  08/24/23 181 lb 6.4 oz (82.3 kg)  02/28/23 160 lb (72.6 kg)  01/20/23 170 lb 6.7 oz (77.3 kg)  Physical Exam Vitals reviewed.  Constitutional:      General: He is not in acute distress.    Appearance: He is not ill-appearing.  Cardiovascular:     Rate and Rhythm: Normal rate and regular rhythm.  Pulmonary:     Effort: Pulmonary effort is normal.     Breath sounds: Normal breath sounds. No wheezing or rales.  Musculoskeletal:     Right lower leg: No edema.     Left lower leg: No edema.  Neurological:     Mental Status: He is alert.      Results for orders placed or performed in visit on 08/24/23  POCT INR  Result Value Ref Range   INR 3.2 (A) 2.0 - 3.0    {Labs (Optional):23779}  The ASCVD Risk score (Arnett DK, et al., 2019) failed to calculate for the following reasons:   The patient has a prior MI or stroke diagnosis    Assessment & Plan:   #1 type 2 diabetes.  Patient treated with Jardiance and metformin.  Out of Jardiance for 2 months.  Suspect A1c will be up.  Recheck A1c today.  Refilled Jardiance.  Schedule III  month follow-up.  Needs yearly diabetic eye exam.  Also check urine microalbumin screen today  #2 hyperlipidemia treated with gemfibrozil and rosuvastatin.  Check lipid and CMP.  #3 history of chronic anemia.  He has past history of iron deficiency.  Recheck CBC  #4 history of CAD.  He has prosthetic aortic valve.  Fleeting chest pain this morning but none currently.  EKG today shows sinus rhythm with rate of 60 and no acute ST-T changes.  He has scheduled follow-up with cardiology tomorrow.  Call 911 or go immediately to the ER for any recurrent chest pain.   Return in about 3 months (around 11/22/2023).    Evelena Peat, MD

## 2023-08-24 NOTE — Telephone Encounter (Signed)
  Pt c/o BP issue: STAT if pt c/o blurred vision, one-sided weakness or slurred speech  1. What are your last 5 BP readings?  8:30 am 158/82 after an hour 148/100  2. Are you having any other symptoms (ex. Dizziness, headache, blurred vision, passed out)?   3. What is your BP issue? The patient said he came to work this morning and his left arm was feeling numb, throbbing, and he was sweating profusely. He went to their office clinic, where the nurse checked his blood pressure, which was 158/82 at 8:30 a.m. An hour later, the nurse went to his office and checked his blood pressure again, which was 148/100. He mentioned he was still looking pale, felt dizzy, and when he tried to do light activity, like walking, he became short of breath. He denied any chest pain. While talking to the patient, he stated that he is currently feeling okay. He has an appointment with his PCP today and has made an appointment with [facility] tomorrow at 1:55 p.m.

## 2023-08-24 NOTE — Telephone Encounter (Signed)
Per MD he would like patient to come back and see him at the end of January. He would like to continue oral antibiotics a year after surgery, around May. Juanita Laster, RMA

## 2023-08-24 NOTE — Telephone Encounter (Signed)
Patient left voicemail with asking when he needs to schedule follow up and how long he will be on oral antibiotics. Per not patient is supposed to be on chronic amoxicillin. Will reach out to provider regarding follow up. Will call patient back once provider responds. Juanita Laster, RMA

## 2023-08-24 NOTE — Patient Instructions (Addendum)
Pre visit review using our clinic review tool, if applicable. No additional management support is needed unless otherwise documented below in the visit note.  Continue 1 tablet daily except take 1 1/2 tablets on Monday and Friday. Recheck in 6 week.

## 2023-08-24 NOTE — Telephone Encounter (Signed)
Left message for patient to call back  

## 2023-08-24 NOTE — Progress Notes (Signed)
Pt denies any changes but is still taking amoxicillin TID since knee surgery. Pt no longer tests INR at home and is in office today for PCP apt and coumadin clinic apt. Continue 1 tablet daily except take 1 1/2 tablets on Monday and Friday. Recheck in 6 week at [facility].

## 2023-08-25 ENCOUNTER — Ambulatory Visit: Payer: Managed Care, Other (non HMO) | Attending: Medical | Admitting: Medical

## 2023-08-25 ENCOUNTER — Encounter: Payer: Self-pay | Admitting: Medical

## 2023-08-25 VITALS — BP 157/88 | HR 74 | Ht 67.0 in | Wt 182.4 lb

## 2023-08-25 DIAGNOSIS — Z79899 Other long term (current) drug therapy: Secondary | ICD-10-CM

## 2023-08-25 DIAGNOSIS — E782 Mixed hyperlipidemia: Secondary | ICD-10-CM

## 2023-08-25 DIAGNOSIS — I10 Essential (primary) hypertension: Secondary | ICD-10-CM | POA: Diagnosis not present

## 2023-08-25 DIAGNOSIS — Z952 Presence of prosthetic heart valve: Secondary | ICD-10-CM

## 2023-08-25 DIAGNOSIS — I25110 Atherosclerotic heart disease of native coronary artery with unstable angina pectoris: Secondary | ICD-10-CM

## 2023-08-25 LAB — CBC WITH DIFFERENTIAL/PLATELET
Basophils Absolute: 0 10*3/uL (ref 0.0–0.1)
Basophils Relative: 1 % (ref 0.0–3.0)
Eosinophils Absolute: 0.2 10*3/uL (ref 0.0–0.7)
Eosinophils Relative: 3.4 % (ref 0.0–5.0)
HCT: 38.1 % — ABNORMAL LOW (ref 39.0–52.0)
Hemoglobin: 13.4 g/dL (ref 13.0–17.0)
Lymphocytes Relative: 26.2 % (ref 12.0–46.0)
Lymphs Abs: 1.2 10*3/uL (ref 0.7–4.0)
MCHC: 35.3 g/dL (ref 30.0–36.0)
MCV: 86.4 fL (ref 78.0–100.0)
Monocytes Absolute: 0.4 10*3/uL (ref 0.1–1.0)
Monocytes Relative: 9.9 % (ref 3.0–12.0)
Neutro Abs: 2.7 10*3/uL (ref 1.4–7.7)
Neutrophils Relative %: 59.5 % (ref 43.0–77.0)
Platelets: 327 10*3/uL (ref 150.0–400.0)
RBC: 4.41 Mil/uL (ref 4.22–5.81)
RDW: 13.8 % (ref 11.5–15.5)
WBC: 4.5 10*3/uL (ref 4.0–10.5)

## 2023-08-25 LAB — COMPREHENSIVE METABOLIC PANEL
ALT: 18 U/L (ref 0–53)
AST: 23 U/L (ref 0–37)
Albumin: 4.9 g/dL (ref 3.5–5.2)
Alkaline Phosphatase: 41 U/L (ref 39–117)
BUN: 10 mg/dL (ref 6–23)
CO2: 26 meq/L (ref 19–32)
Calcium: 9.9 mg/dL (ref 8.4–10.5)
Chloride: 102 meq/L (ref 96–112)
Creatinine, Ser: 0.76 mg/dL (ref 0.40–1.50)
GFR: 96.76 mL/min (ref >60.00)
Glucose, Bld: 117 mg/dL — ABNORMAL HIGH (ref 70–99)
Potassium: 4 meq/L (ref 3.5–5.1)
Sodium: 138 meq/L (ref 135–145)
Total Bilirubin: 0.8 mg/dL (ref 0.2–1.2)
Total Protein: 7.6 g/dL (ref 6.0–8.3)

## 2023-08-25 LAB — MICROALBUMIN / CREATININE URINE RATIO
Creatinine,U: 28.5 mg/dL
Microalb Creat Ratio: 2.5 mg/g (ref 0.0–30.0)
Microalb, Ur: <0.7 mg/dL (ref 0.0–1.9)

## 2023-08-25 LAB — LIPID PANEL
Cholesterol: 133 mg/dL (ref 0–200)
HDL: 32.9 mg/dL — ABNORMAL LOW (ref >39.00)
LDL Cholesterol: 43 mg/dL (ref 0–99)
NonHDL: 100.07
Total CHOL/HDL Ratio: 4
Triglycerides: 287 mg/dL — ABNORMAL HIGH (ref 0.0–149.0)
VLDL: 57.4 mg/dL — ABNORMAL HIGH (ref 0.0–40.0)

## 2023-08-25 LAB — HEMOGLOBIN A1C: Hgb A1c MFr Bld: 7.6 % — ABNORMAL HIGH (ref 4.6–6.5)

## 2023-08-25 MED ORDER — LOSARTAN POTASSIUM 25 MG PO TABS: 25.0000 mg | ORAL_TABLET | Freq: Every day | ORAL | 3 refills | Status: DC

## 2023-08-25 NOTE — H&P (View-Only) (Signed)
 Cardiology Office Note:    Date:  08/25/2023   ID:  Dennis Bates, DOB 1961/09/13, MRN [id number]  PCP:  Kristian Covey, MD  [facility] Cardiologist:  Bryan Lemma, MD  [facility] Electrophysiologist:  None   Referring MD: Kristian Covey, MD   Chief Complaint: chest discomfort  History of Present Illness:    Dennis Bates is a 62 y.o. 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  Patient had a inferior STEMI in January 2016.  He was found to have 100% occlusion of the circumflex treated with DES/PCI to the OM 2.  Cath showed severe aortic stenosis, and this was confirmed by echocardiogram.  It showed severe bicuspid aortic valve stenosis.  After 1 year of DAPT in January 2017, the patient underwent mechanical AVR via Bentall procedure by Dr. Lavinia Sharps.  Preop cath showed widely patent stent with no significant disease.  The patient was admitted in May 2021 for GI bleed with hemoglobin of 5.  Coumadin and aspirin were held.  The patient was discharged home on Lovenox bridge with warfarin.  Aspirin was not restarted.  Patient was last seen in December 2021 was overall stable from a cardiac perspective.  Today, the patient reports yesterday he had left arm numbness with left arm pain, and back of his head was sweating. He felt bad. He was also short of breath. If he walked, symptoms worsened. He had chest discomfort. Everything he experienced reminded him of his heat attack. He did not take a SL NTG. EKG from yesterday showed no ischemic changes.  Past Medical History:  Diagnosis Date   Aortic stenosis due to bicuspid aortic valve 09/2014   Severe aortic stenosis of bicuspid valve - s/pAVR with Bentall ( 09/2015)   Bell's palsy    LAST EPISODE 09-2015   CAD S/P percutaneous  coronary angioplasty 09/20/2014   a. inferolat STEMI ->> LHC-Angio: Proximal/ostial LAD 20%, OM2 99% -->> PCI: 3mm x 16 mm Promus Premier DES to the OM2.(~3.5 mm)   Diabetes mellitus type 2, controlled, with complications (HCC)    History of mechanical aortic valve replacement 10/09/2015   after 1 yr DAPT for Inferolateral STEMI. (Dr. 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   Shoulder arthritis 07/11/2020   Rt Severe GH DJD xray Sep 2021   ST elevation myocardial infarction (STEMI) of inferior wall (HCC) 09/20/2014   99 % occluded Cx-OM2 Promus DES 3.0 mm x 16 mm (3.5 mm)    Past Surgical History:  Procedure Laterality Date   BENTALL PROCEDURE N/A 10/09/2015   Procedure: BENTALL PROCEDURE (using a St Jude mechanical valve, size 23);  Surgeon: Alleen Borne, MD;  Location: [facility] OR;  Service: Open Heart Surgery;  Laterality: N/A;  CIRC ARRESTNEEDS RIGHT RADIAL A-LINE   COLONOSCOPY WITH PROPOFOL N/A 01/21/2020   Procedure: COLONOSCOPY WITH PROPOFOL;  Surgeon: Lynann Bologna, MD;  Location: [facility] ENDOSCOPY;  Service: Endoscopy;  Laterality: N/A;   ENTEROSCOPY N/A 01/24/2020   Procedure: ENTEROSCOPY;  Surgeon: Sherrilyn Rist, MD;  Location: [facility] ENDOSCOPY;  Service: Gastroenterology;  Laterality: N/A;   ESOPHAGOGASTRODUODENOSCOPY (EGD) WITH PROPOFOL N/A 01/21/2020   Procedure: ESOPHAGOGASTRODUODENOSCOPY (EGD) WITH PROPOFOL;  Surgeon: Lynann Bologna, MD;  Location[facility] ENDOSCOPY;  Service: Endoscopy;  Laterality: N/A;   GIVENS CAPSULE STUDY N/A 01/22/2020   Procedure: GIVENS CAPSULE STUDY;  Surgeon: Sherrilyn Rist, MD;  Location: [facility] ENDOSCOPY;  Service: Gastroenterology;  Laterality: N/A;   IRRIGATION AND DEBRIDEMENT KNEE Right 01/24/2023   Procedure: IRRIGATION AND DEBRIDEMENT KNEE;  Surgeon: Jodi Geralds, MD;  Location: [facility] OR;  Service: Orthopedics;  Laterality: Right;   KNEE ARTHROSCOPY W/ ACL RECONSTRUCTION Right     90's; "2 ATHROSCOPIC , 2 REBUILDS"   LEFT HEART CATHETERIZATION WITH CORONARY ANGIOGRAM N/A 09/20/2014   Procedure: LEFT HEART CATHETERIZATION WITH CORONARY ANGIOGRAM;  Surgeon: Marykay Lex, MD;  Location: [facility] CATH LAB;  Proximal/ostial LAD 20%, OM2 99% -->> aspiration thrombectomy and DES PCI:    PERCUTANEOUS CORONARY STENT INTERVENTION (PCI-S)  09/20/2014   Aspiration thrombectomy and DES PCI- OM 2(m-dCx): Promus Premier DES 3.0 mm x 16 mm (3.5 mm)   RIGHT/LEFT HEART CATH AND CORONARY ANGIOGRAPHY  09/2015   Dr. Nicki Guadalajara: Widely patent OM 2 stent.  Mild disease in RCA and LAD.Relatively normal right heart cath pressures.  Normal cardiac output.   TEE WITHOUT CARDIOVERSION N/A 10/09/2015   Procedure: TRANSESOPHAGEAL ECHOCARDIOGRAM (TEE);  Surgeon: Alleen Borne, MD;  Location[facility] OR;  Service: Open Heart Surgery;  Laterality: N/A;   TEE WITHOUT CARDIOVERSION N/A 01/21/2023   Procedure: TRANSESOPHAGEAL ECHOCARDIOGRAM;  Surgeon: Pricilla Riffle, MD;  Location: [facility] INVASIVE CV LAB;  Service: Cardiovascular;  Laterality: N/A;   TOTAL KNEE ARTHROPLASTY Right 12/09/2017   Procedure: RIGHT TOTAL KNEE ARTHROPLASTY REMOVAL OF HARDWARE RIGHT KNEE;  Surgeon: Jodi Geralds, MD;  Location: [facility] ORS;  Service: Orthopedics;  Laterality: Right;   TOTAL KNEE REVISION Right 01/24/2023   Procedure: TOTAL KNEE REVISION;  Surgeon: Jodi Geralds, MD;  Location: [facility] OR;  Service: Orthopedics;  Laterality: Right;   TRANSTHORACIC ECHOCARDIOGRAM  02/2016   Post AVR: mechanical: AVR without obstuction. LVEF improved to 65-70%.   TRANSTHORACIC ECHOCARDIOGRAM  09/2014, 10/2014   Probable bicuspid aortic valve: a. mod-sev by 2D ECHO 09/2014. AVA 0.9cm^2; b) 10/2014 Echo:. Severe AS Mn-Pk Gradient 41-71 mmHg, AVA ~0.79 cm2, EF 60-65%   TRANSTHORACIC ECHOCARDIOGRAM  12/2019    Normal WM. Gr 2 DD. Mild LA dilation. 23 mm St. Jude Mechanical AoV well seated.  Trivial AI.     Current Medications: Current Meds  Medication Sig    losartan (COZAAR) 25 MG tablet Take 1 tablet (25 mg total) by mouth daily.     Allergies:   Patient has no known allergies.   Social History   Socioeconomic History   Marital status: Married    Spouse name: Not on file   Number of children: Not on file   Years of education: Not on file   Highest education level: Bachelor's degree (e.g., BA, AB, BS)  Occupational History   Occupation: [employer]  Tobacco Use   Smoking status: Never   Smokeless tobacco: Former    Types: Chew    Quit date: 09/01/1985  Vaping Use   Vaping status: Never Used  Substance and Sexual Activity   Alcohol use: Yes    Alcohol/week: 0.0 standard drinks of alcohol    Comment: socially   Drug use: No   Sexual activity: Not on file  Other Topics Concern   Not on file  Social History Narrative   Not on file   Social Drivers of Health   Financial Resource Strain: Medium Risk (08/20/2023)   Overall Financial Resource Strain (CARDIA)    Difficulty of Paying  Living Expenses: Somewhat hard  Food Insecurity: No Food Insecurity (08/20/2023)   Hunger Vital Sign    Worried About Running Out of Food in the Last Year: Never true    Ran Out of Food in the Last Year: Never true  Transportation Needs: No Transportation Needs (08/20/2023)   PRAPARE - Administrator, Civil Service (Medical): No    Lack of Transportation (Non-Medical): No  Physical Activity: Unknown (08/20/2023)   Exercise Vital Sign    Days of Exercise per Week: 0 days    Minutes of Exercise per Session: Not on file  Stress: No Stress Concern Present (08/20/2023)   [employer] of Occupational Health - Occupational Stress Questionnaire    Feeling of Stress : Only a little  Social Connections: Socially Integrated (08/20/2023)   Social Connection and Isolation Panel [NHANES]    Frequency of Communication with Friends and Family: More than three times a week    Frequency of Social Gatherings with Friends and Family: Three times a week     Attends Religious Services: More than 4 times per year    Active Member of Clubs or Organizations: Yes    Attends Engineer, structural: More than 4 times per year    Marital Status: Married     Family History: The patient's family history includes Heart disease in his mother. There is no history of CAD.  ROS:   Please see the history of present illness.     All other systems reviewed and are negative.  EKGs/Labs/Other Studies Reviewed:    The following studies were reviewed today:  Echo 01/2023  1. Right ventricular systolic function is normal. The right ventricular  size is normal. There is mildly elevated pulmonary artery systolic  pressure.   2. There is a 23 mm St. Jude Careers information officer series valve present in  the aortic position.   3. Left ventricular ejection fraction, by estimation, is 55 to 60%. The  left ventricle has normal function. The left ventricle has no regional  wall motion abnormalities. There is moderate concentric left ventricular  hypertrophy. Left ventricular  diastolic parameters are consistent with Grade II diastolic dysfunction  (pseudonormalization). Elevated left ventricular end-diastolic pressure.   4. The mitral valve is normal in structure. Mild mitral valve  regurgitation. No evidence of mitral stenosis.   5. The inferior vena cava is dilated in size with <50% respiratory  variability, suggesting right atrial pressure of 15 mmHg.   6. Aortic root/ascending aorta has been repaired/replaced.   EKG:  EKG is not ordered today.   Recent Labs: 08/24/2023: ALT 18; BUN 10; Creatinine, Ser 0.76; Hemoglobin 13.4; Platelets 327.0; Potassium 4.0; Sodium 138  Recent Lipid Panel    Component Value Date/Time   CHOL 133 08/24/2023 1624   CHOL 137 08/28/2020 1002   TRIG 287.0 (H) 08/24/2023 1624   HDL 32.90 (L) 08/24/2023 1624   HDL 32 (L) 08/28/2020 1002   CHOLHDL 4 08/24/2023 1624   VLDL 57.4 (H) 08/24/2023 1624   LDLCALC 43 08/24/2023 1624    LDLCALC 49 08/28/2020 1002   LDLDIRECT 87.0 12/14/2021 0824    Physical Exam:    VS:  BP (!) 157/88   Pulse 74   Ht 5\' 7"  (1.702 m)   Wt 182 lb 6.4 oz (82.7 kg)   SpO2 98%   BMI 28.57 kg/m     Wt Readings from Last 3 Encounters:  08/25/23 182 lb 6.4 oz (82.7 kg)  08/24/23 181 lb  6.4 oz (82.3 kg)  02/28/23 160 lb (72.6 kg)     GEN:  Well nourished, well developed in no acute distress HEENT: Normal NECK: No JVD; No carotid bruits LYMPHATICS: No lymphadenopathy CARDIAC: RRR, no murmurs, rubs, gallops RESPIRATORY:  Clear to auscultation without rales, wheezing or rhonchi  ABDOMEN: Soft, non-tender, non-distended MUSCULOSKELETAL:  No edema; No deformity  SKIN: Warm and dry NEUROLOGIC:  Alert and oriented x 3 PSYCHIATRIC:  Normal affect   ASSESSMENT:    1. Coronary artery disease involving native coronary artery of native heart with unstable angina pectoris (HCC)   2. Essential hypertension   3. S/P AVR   4. Hyperlipidemia, mixed   5. Medication management    PLAN:    In order of problems listed above:  Chest discomfort CAD s/p DES PCI to Cx-OM2 The patient had an episode of chest discomfort, left arm numbness, diaphoresis and SOB that felt similar to prior heart attack. BP has been mildly elevated. After a long discussion we decided to pursue a LHC. He is on warfarin and will need bridging with Lovenox. We will see him back after the LHC. Continue Crestor and BB therapy.   HTN BP elevated today. Unable to increase Coreg due to borderline HR. I will start Losartan 25mg  daily. BMET in 2 weeks.   H/o AVR and aortic root procedure Recent echo showed normally functioning valve. Continue warfarin.  HLD Hypertriglyceridemia LDL 43, TG 287, HDL 32. Continue gemifibrizil and Crestor 20mg  daily. May need Vascepa.   Disposition: Follow up in 3 week(s) with MD/APP   Shared Decision Making/Informed Consent   Informed Consent   Shared Decision Making/Informed Consent The  risks [stroke (1 in 1000), death (1 in 1000), kidney failure [usually temporary] (1 in 500), bleeding (1 in 200), allergic reaction [possibly serious] (1 in 200)], benefits (diagnostic support and management of coronary artery disease) and alternatives of a cardiac catheterization were discussed in detail with Mr. Karman and he is willing to proceed.       Signed, Elizardo Chilson David Stall, PA-C  08/25/2023 4:03 PM    [facility]

## 2023-08-25 NOTE — Patient Instructions (Addendum)
Medication Instructions:   Please start Losartan 25 MG daily.  *If you need a refill on your cardiac medications before your next appointment, please call your pharmacy*   Lab Work: Your provider would like for you to have following labs drawn today CBC, PT/INR and BMP.   If you have labs (blood work) drawn today and your tests are completely normal, you will receive your results only by: MyChart Message (if you have MyChart) OR A paper copy in the mail If you have any lab test that is abnormal or we need to change your treatment, we will call you to review the results.   Testing/Procedures:  [place] [facility] [place] [address] [place] [address] Dept: [phone] Loc: [phone]  JAYVEON DEMO  08/25/2023  You are scheduled for a Cardiac Catheterization on Thursday, December 19 with Dr. Lorine Bears.  1. Please arrive at the [facility] Entrance of [facility], [address], [place] [address] at 11:30 AM (This is 1 hour(s) prior to your procedure time).  Proceed to the Check-In Desk directly inside the entrance.  Procedure Parking: Use the entrance off of the [address] side of the hospital. Turn right upon entering and follow the driveway to parking that is directly in front of the Heart [facility] Vascular Center. There is no valet parking available at this entrance, however there is an awning directly in front of the Heart & Vascular Center for drop off/ pick up for patients.  Special note: Every effort is made to have your procedure done on time. Please understand that emergencies sometimes delay scheduled procedures.  2. Diet: Do not eat solid foods after midnight.  The patient may have clear liquids until 5am upon the day of the procedure.  4. Medication instructions in preparation for your procedure:   Contrast Allergy: No  Stop Coumadin ( Will  contact you with further instructions from pharmacy)  Please hold Jardiance the morning of your procedure.    Do not take Diabetes Med Glucophage (Metformin) on the day of the procedure and HOLD 48 HOURS AFTER THE PROCEDURE.  On the morning of your procedure you may take any morning medicines NOT listed above.  You may use sips of water.  5. Plan to go home the same day, you will only stay overnight if medically necessary. 6. Bring a current list of your medications and current insurance cards. 7. You MUST have a responsible person to drive you home. 8. Someone MUST be with you the first 24 hours after you arrive home or your discharge will be delayed. 9. Please wear clothes that are easy to get on and off and wear slip-on shoes.  Thank you for allowing [place] to care for you!   -- [place] [facility]    Follow-Up: At [facility], you and your health needs are our priority.  As part of our continuing mission to provide you with exceptional heart care, we have created designated Provider Care Teams.  These Care Teams include your primary Cardiologist (physician) and Advanced Practice Providers (APPs -  Physician Assistants and Nurse Practitioners) who all work together to provide you with the care you need, when you need it.  We recommend signing up for the patient portal called "MyChart".  Sign up information is provided on this After Visit Summary.  MyChart is used to connect with patients for Virtual  Visits (Telemedicine).  Patients are able to view lab/test results, encounter notes, upcoming appointments, etc.  Non-urgent messages can be sent to your provider as well.   To learn more about what you can do with MyChart, go to [email or web address].    Your next appointment:   3 week(s)  Provider:   You may see Bryan Lemma, MD or one of the following Advanced Practice Providers on your designated Care Team:   Nicolasa Ducking, NP Eula Listen,  PA-C Cadence Fransico Michael, PA-C Charlsie Quest, NP Carlos Levering, NP

## 2023-08-25 NOTE — Telephone Encounter (Signed)
Patient seen in practice today.

## 2023-08-25 NOTE — Progress Notes (Signed)
Cardiology Office Note:    Date:  08/25/2023   ID:  Dennis Bates, DOB 1961/09/13, MRN [id number]  PCP:  Kristian Covey, MD  [facility] Cardiologist:  Bryan Lemma, MD  [facility] Electrophysiologist:  None   Referring MD: Kristian Covey, MD   Chief Complaint: chest discomfort  History of Present Illness:    Dennis Bates is a 62 y.o. 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  Patient had a inferior STEMI in January 2016.  He was found to have 100% occlusion of the circumflex treated with DES/PCI to the OM 2.  Cath showed severe aortic stenosis, and this was confirmed by echocardiogram.  It showed severe bicuspid aortic valve stenosis.  After 1 year of DAPT in January 2017, the patient underwent mechanical AVR via Bentall procedure by Dr. Lavinia Sharps.  Preop cath showed widely patent stent with no significant disease.  The patient was admitted in May 2021 for GI bleed with hemoglobin of 5.  Coumadin and aspirin were held.  The patient was discharged home on Lovenox bridge with warfarin.  Aspirin was not restarted.  Patient was last seen in December 2021 was overall stable from a cardiac perspective.  Today, the patient reports yesterday he had left arm numbness with left arm pain, and back of his head was sweating. He felt bad. He was also short of breath. If he walked, symptoms worsened. He had chest discomfort. Everything he experienced reminded him of his heat attack. He did not take a SL NTG. EKG from yesterday showed no ischemic changes.  Past Medical History:  Diagnosis Date   Aortic stenosis due to bicuspid aortic valve 09/2014   Severe aortic stenosis of bicuspid valve - s/pAVR with Bentall ( 09/2015)   Bell's palsy    LAST EPISODE 09-2015   CAD S/P percutaneous  coronary angioplasty 09/20/2014   a. inferolat STEMI ->> LHC-Angio: Proximal/ostial LAD 20%, OM2 99% -->> PCI: 3mm x 16 mm Promus Premier DES to the OM2.(~3.5 mm)   Diabetes mellitus type 2, controlled, with complications (HCC)    History of mechanical aortic valve replacement 10/09/2015   after 1 yr DAPT for Inferolateral STEMI. (Dr. 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   Shoulder arthritis 07/11/2020   Rt Severe GH DJD xray Sep 2021   ST elevation myocardial infarction (STEMI) of inferior wall (HCC) 09/20/2014   99 % occluded Cx-OM2 Promus DES 3.0 mm x 16 mm (3.5 mm)    Past Surgical History:  Procedure Laterality Date   BENTALL PROCEDURE N/A 10/09/2015   Procedure: BENTALL PROCEDURE (using a St Jude mechanical valve, size 23);  Surgeon: Alleen Borne, MD;  Location: [facility] OR;  Service: Open Heart Surgery;  Laterality: N/A;  CIRC ARRESTNEEDS RIGHT RADIAL A-LINE   COLONOSCOPY WITH PROPOFOL N/A 01/21/2020   Procedure: COLONOSCOPY WITH PROPOFOL;  Surgeon: Lynann Bologna, MD;  Location: [facility] ENDOSCOPY;  Service: Endoscopy;  Laterality: N/A;   ENTEROSCOPY N/A 01/24/2020   Procedure: ENTEROSCOPY;  Surgeon: Sherrilyn Rist, MD;  Location: [facility] ENDOSCOPY;  Service: Gastroenterology;  Laterality: N/A;   ESOPHAGOGASTRODUODENOSCOPY (EGD) WITH PROPOFOL N/A 01/21/2020   Procedure: ESOPHAGOGASTRODUODENOSCOPY (EGD) WITH PROPOFOL;  Surgeon: Lynann Bologna, MD;  Location[facility] ENDOSCOPY;  Service: Endoscopy;  Laterality: N/A;   GIVENS CAPSULE STUDY N/A 01/22/2020   Procedure: GIVENS CAPSULE STUDY;  Surgeon: Sherrilyn Rist, MD;  Location: [facility] ENDOSCOPY;  Service: Gastroenterology;  Laterality: N/A;   IRRIGATION AND DEBRIDEMENT KNEE Right 01/24/2023   Procedure: IRRIGATION AND DEBRIDEMENT KNEE;  Surgeon: Jodi Geralds, MD;  Location: [facility] OR;  Service: Orthopedics;  Laterality: Right;   KNEE ARTHROSCOPY W/ ACL RECONSTRUCTION Right     90's; "2 ATHROSCOPIC , 2 REBUILDS"   LEFT HEART CATHETERIZATION WITH CORONARY ANGIOGRAM N/A 09/20/2014   Procedure: LEFT HEART CATHETERIZATION WITH CORONARY ANGIOGRAM;  Surgeon: Marykay Lex, MD;  Location: [facility] CATH LAB;  Proximal/ostial LAD 20%, OM2 99% -->> aspiration thrombectomy and DES PCI:    PERCUTANEOUS CORONARY STENT INTERVENTION (PCI-S)  09/20/2014   Aspiration thrombectomy and DES PCI- OM 2(m-dCx): Promus Premier DES 3.0 mm x 16 mm (3.5 mm)   RIGHT/LEFT HEART CATH AND CORONARY ANGIOGRAPHY  09/2015   Dr. Nicki Guadalajara: Widely patent OM 2 stent.  Mild disease in RCA and LAD.Relatively normal right heart cath pressures.  Normal cardiac output.   TEE WITHOUT CARDIOVERSION N/A 10/09/2015   Procedure: TRANSESOPHAGEAL ECHOCARDIOGRAM (TEE);  Surgeon: Alleen Borne, MD;  Location[facility] OR;  Service: Open Heart Surgery;  Laterality: N/A;   TEE WITHOUT CARDIOVERSION N/A 01/21/2023   Procedure: TRANSESOPHAGEAL ECHOCARDIOGRAM;  Surgeon: Pricilla Riffle, MD;  Location: [facility] INVASIVE CV LAB;  Service: Cardiovascular;  Laterality: N/A;   TOTAL KNEE ARTHROPLASTY Right 12/09/2017   Procedure: RIGHT TOTAL KNEE ARTHROPLASTY REMOVAL OF HARDWARE RIGHT KNEE;  Surgeon: Jodi Geralds, MD;  Location: [facility] ORS;  Service: Orthopedics;  Laterality: Right;   TOTAL KNEE REVISION Right 01/24/2023   Procedure: TOTAL KNEE REVISION;  Surgeon: Jodi Geralds, MD;  Location: [facility] OR;  Service: Orthopedics;  Laterality: Right;   TRANSTHORACIC ECHOCARDIOGRAM  02/2016   Post AVR: mechanical: AVR without obstuction. LVEF improved to 65-70%.   TRANSTHORACIC ECHOCARDIOGRAM  09/2014, 10/2014   Probable bicuspid aortic valve: a. mod-sev by 2D ECHO 09/2014. AVA 0.9cm^2; b) 10/2014 Echo:. Severe AS Mn-Pk Gradient 41-71 mmHg, AVA ~0.79 cm2, EF 60-65%   TRANSTHORACIC ECHOCARDIOGRAM  12/2019    Normal WM. Gr 2 DD. Mild LA dilation. 23 mm St. Jude Mechanical AoV well seated.  Trivial AI.     Current Medications: Current Meds  Medication Sig    losartan (COZAAR) 25 MG tablet Take 1 tablet (25 mg total) by mouth daily.     Allergies:   Patient has no known allergies.   Social History   Socioeconomic History   Marital status: Married    Spouse name: Not on file   Number of children: Not on file   Years of education: Not on file   Highest education level: Bachelor's degree (e.g., BA, AB, BS)  Occupational History   Occupation: [employer]  Tobacco Use   Smoking status: Never   Smokeless tobacco: Former    Types: Chew    Quit date: 09/01/1985  Vaping Use   Vaping status: Never Used  Substance and Sexual Activity   Alcohol use: Yes    Alcohol/week: 0.0 standard drinks of alcohol    Comment: socially   Drug use: No   Sexual activity: Not on file  Other Topics Concern   Not on file  Social History Narrative   Not on file   Social Drivers of Health   Financial Resource Strain: Medium Risk (08/20/2023)   Overall Financial Resource Strain (CARDIA)    Difficulty of Paying  Living Expenses: Somewhat hard  Food Insecurity: No Food Insecurity (08/20/2023)   Hunger Vital Sign    Worried About Running Out of Food in the Last Year: Never true    Ran Out of Food in the Last Year: Never true  Transportation Needs: No Transportation Needs (08/20/2023)   PRAPARE - Administrator, Civil Service (Medical): No    Lack of Transportation (Non-Medical): No  Physical Activity: Unknown (08/20/2023)   Exercise Vital Sign    Days of Exercise per Week: 0 days    Minutes of Exercise per Session: Not on file  Stress: No Stress Concern Present (08/20/2023)   [employer] of Occupational Health - Occupational Stress Questionnaire    Feeling of Stress : Only a little  Social Connections: Socially Integrated (08/20/2023)   Social Connection and Isolation Panel [NHANES]    Frequency of Communication with Friends and Family: More than three times a week    Frequency of Social Gatherings with Friends and Family: Three times a week     Attends Religious Services: More than 4 times per year    Active Member of Clubs or Organizations: Yes    Attends Engineer, structural: More than 4 times per year    Marital Status: Married     Family History: The patient's family history includes Heart disease in his mother. There is no history of CAD.  ROS:   Please see the history of present illness.     All other systems reviewed and are negative.  EKGs/Labs/Other Studies Reviewed:    The following studies were reviewed today:  Echo 01/2023  1. Right ventricular systolic function is normal. The right ventricular  size is normal. There is mildly elevated pulmonary artery systolic  pressure.   2. There is a 23 mm St. Jude Careers information officer series valve present in  the aortic position.   3. Left ventricular ejection fraction, by estimation, is 55 to 60%. The  left ventricle has normal function. The left ventricle has no regional  wall motion abnormalities. There is moderate concentric left ventricular  hypertrophy. Left ventricular  diastolic parameters are consistent with Grade II diastolic dysfunction  (pseudonormalization). Elevated left ventricular end-diastolic pressure.   4. The mitral valve is normal in structure. Mild mitral valve  regurgitation. No evidence of mitral stenosis.   5. The inferior vena cava is dilated in size with <50% respiratory  variability, suggesting right atrial pressure of 15 mmHg.   6. Aortic root/ascending aorta has been repaired/replaced.   EKG:  EKG is not ordered today.   Recent Labs: 08/24/2023: ALT 18; BUN 10; Creatinine, Ser 0.76; Hemoglobin 13.4; Platelets 327.0; Potassium 4.0; Sodium 138  Recent Lipid Panel    Component Value Date/Time   CHOL 133 08/24/2023 1624   CHOL 137 08/28/2020 1002   TRIG 287.0 (H) 08/24/2023 1624   HDL 32.90 (L) 08/24/2023 1624   HDL 32 (L) 08/28/2020 1002   CHOLHDL 4 08/24/2023 1624   VLDL 57.4 (H) 08/24/2023 1624   LDLCALC 43 08/24/2023 1624    LDLCALC 49 08/28/2020 1002   LDLDIRECT 87.0 12/14/2021 0824    Physical Exam:    VS:  BP (!) 157/88   Pulse 74   Ht 5\' 7"  (1.702 m)   Wt 182 lb 6.4 oz (82.7 kg)   SpO2 98%   BMI 28.57 kg/m     Wt Readings from Last 3 Encounters:  08/25/23 182 lb 6.4 oz (82.7 kg)  08/24/23 181 lb  6.4 oz (82.3 kg)  02/28/23 160 lb (72.6 kg)     GEN:  Well nourished, well developed in no acute distress HEENT: Normal NECK: No JVD; No carotid bruits LYMPHATICS: No lymphadenopathy CARDIAC: RRR, no murmurs, rubs, gallops RESPIRATORY:  Clear to auscultation without rales, wheezing or rhonchi  ABDOMEN: Soft, non-tender, non-distended MUSCULOSKELETAL:  No edema; No deformity  SKIN: Warm and dry NEUROLOGIC:  Alert and oriented x 3 PSYCHIATRIC:  Normal affect   ASSESSMENT:    1. Coronary artery disease involving native coronary artery of native heart with unstable angina pectoris (HCC)   2. Essential hypertension   3. S/P AVR   4. Hyperlipidemia, mixed   5. Medication management    PLAN:    In order of problems listed above:  Chest discomfort CAD s/p DES PCI to Cx-OM2 The patient had an episode of chest discomfort, left arm numbness, diaphoresis and SOB that felt similar to prior heart attack. BP has been mildly elevated. After a long discussion we decided to pursue a LHC. He is on warfarin and will need bridging with Lovenox. We will see him back after the LHC. Continue Crestor and BB therapy.   HTN BP elevated today. Unable to increase Coreg due to borderline HR. I will start Losartan 25mg  daily. BMET in 2 weeks.   H/o AVR and aortic root procedure Recent echo showed normally functioning valve. Continue warfarin.  HLD Hypertriglyceridemia LDL 43, TG 287, HDL 32. Continue gemifibrizil and Crestor 20mg  daily. May need Vascepa.   Disposition: Follow up in 3 week(s) with MD/APP   Shared Decision Making/Informed Consent   Informed Consent   Shared Decision Making/Informed Consent The  risks [stroke (1 in 1000), death (1 in 1000), kidney failure [usually temporary] (1 in 500), bleeding (1 in 200), allergic reaction [possibly serious] (1 in 200)], benefits (diagnostic support and management of coronary artery disease) and alternatives of a cardiac catheterization were discussed in detail with Mr. Karman and he is willing to proceed.       Signed, Elizardo Chilson David Stall, PA-C  08/25/2023 4:03 PM    [facility]

## 2023-08-26 ENCOUNTER — Telehealth: Payer: Self-pay

## 2023-08-26 ENCOUNTER — Other Ambulatory Visit: Payer: Self-pay | Admitting: Family Medicine

## 2023-08-26 ENCOUNTER — Telehealth: Payer: Self-pay | Admitting: Medical

## 2023-08-26 LAB — BASIC METABOLIC PANEL
BUN/Creatinine Ratio: 11 (ref 10–24)
BUN: 10 mg/dL (ref 8–27)
CO2: 21 mmol/L (ref 20–29)
Calcium: 9.9 mg/dL (ref 8.6–10.2)
Chloride: 100 mmol/L (ref 96–106)
Creatinine, Ser: 0.9 mg/dL (ref 0.76–1.27)
Glucose: 140 mg/dL — ABNORMAL HIGH (ref 70–99)
Potassium: 4.5 mmol/L (ref 3.5–5.2)
Sodium: 138 mmol/L (ref 134–144)
eGFR: 97 mL/min/{1.73_m2} (ref >59)

## 2023-08-26 LAB — CBC
Hematocrit: 37.1 % — ABNORMAL LOW (ref 37.5–51.0)
Hemoglobin: 13 g/dL (ref 13.0–17.7)
MCH: 30 pg (ref 26.6–33.0)
MCHC: 35 g/dL (ref 31.5–35.7)
MCV: 86 fL (ref 79–97)
Platelets: 342 10*3/uL (ref 150–450)
RBC: 4.34 x10E6/uL (ref 4.14–5.80)
RDW: 12.6 % (ref 11.6–15.4)
WBC: 4.1 10*3/uL (ref 3.4–10.8)

## 2023-08-26 LAB — PROTIME-INR
INR: 2.7 — ABNORMAL HIGH (ref 0.9–1.2)
Prothrombin Time: 27.7 s — ABNORMAL HIGH (ref 9.1–12.0)

## 2023-08-26 MED ORDER — ENOXAPARIN SODIUM 80 MG/0.8ML IJ SOSY: 80.0000 mg | PREFILLED_SYRINGE | Freq: Two times a day (BID) | INTRAMUSCULAR | 0 refills | Status: DC

## 2023-08-26 NOTE — Telephone Encounter (Signed)
Called pharmacy and clarified Lovenox order.

## 2023-08-26 NOTE — Telephone Encounter (Signed)
Per cardiology pharmacist, Malena Peer, RPH-CPP, pt is scheduled for heart cath for 12/19 and will be placed on a Lovenox bridge by cardiology. They will provide dosing instructions for the bridge to the pt. They are requesting recheck of INR for 12/25 but the clinic is not open due to holiday. Advised only day available for [facility] coumadin clinic that week is 12/23. Melissa verbalized that would be ok.    Contacted pt and scheduled for coumadin clinic for 12/23 at [facility]. Pt reports he has not received Lovenox bridge instructions yet from cardiology pharmacist. Advised to contact cardiology and inquire about the instructions and if any problems to call this coumadin clinic back. Pt verbalized understanding.

## 2023-08-26 NOTE — Telephone Encounter (Signed)
Pt c/o medication issue:  1. Name of Medication:  enoxaparin (LOVENOX) 80 MG/0.8ML injection   2. How are you currently taking this medication (dosage and times per day)? N/A  3. Are you having a reaction (difficulty breathing--STAT)? N/A  4. What is your medication issue? Needing confirmation on if prescription is meant to say 13 pens instead of 13 ML. Requesting callback today due to the patient needing to start the medication Sunday. Please advise.

## 2023-08-26 NOTE — Telephone Encounter (Signed)
Called patient to review Coumadin and Lovenox instructions. No answer at this time. Left message for patient to call back.

## 2023-08-27 ENCOUNTER — Other Ambulatory Visit: Payer: Self-pay | Admitting: Family Medicine

## 2023-08-27 DIAGNOSIS — K8020 Calculus of gallbladder without cholecystitis without obstruction: Secondary | ICD-10-CM

## 2023-08-29 ENCOUNTER — Encounter: Payer: Self-pay | Admitting: Cardiology

## 2023-08-30 ENCOUNTER — Other Ambulatory Visit: Payer: Self-pay | Admitting: Family Medicine

## 2023-08-31 ENCOUNTER — Other Ambulatory Visit: Payer: Self-pay | Admitting: Family Medicine

## 2023-08-31 DIAGNOSIS — K8020 Calculus of gallbladder without cholecystitis without obstruction: Secondary | ICD-10-CM

## 2023-09-01 ENCOUNTER — Other Ambulatory Visit: Payer: Self-pay

## 2023-09-01 ENCOUNTER — Encounter
Admission: RE | Disposition: A | Discharge status: Home / Self Care | Payer: Self-pay | Source: Home / Self Care | Attending: Cardiovascular Disease

## 2023-09-01 ENCOUNTER — Encounter: Payer: Self-pay | Admitting: Cardiovascular Disease

## 2023-09-01 ENCOUNTER — Ambulatory Visit
Admission: RE | Admit: 2023-09-01 | Discharge: 2023-09-01 | Disposition: A | Discharge status: Home / Self Care | Payer: Managed Care, Other (non HMO) | Attending: Cardiovascular Disease | Admitting: Cardiovascular Disease

## 2023-09-01 DIAGNOSIS — I10 Essential (primary) hypertension: Secondary | ICD-10-CM | POA: Insufficient documentation

## 2023-09-01 DIAGNOSIS — Z79899 Other long term (current) drug therapy: Secondary | ICD-10-CM | POA: Diagnosis not present

## 2023-09-01 DIAGNOSIS — I25110 Atherosclerotic heart disease of native coronary artery with unstable angina pectoris: Secondary | ICD-10-CM | POA: Insufficient documentation

## 2023-09-01 DIAGNOSIS — I200 Unstable angina: Secondary | ICD-10-CM

## 2023-09-01 DIAGNOSIS — I350 Nonrheumatic aortic (valve) stenosis: Secondary | ICD-10-CM | POA: Insufficient documentation

## 2023-09-01 DIAGNOSIS — I252 Old myocardial infarction: Secondary | ICD-10-CM | POA: Diagnosis not present

## 2023-09-01 DIAGNOSIS — E782 Mixed hyperlipidemia: Secondary | ICD-10-CM | POA: Diagnosis not present

## 2023-09-01 DIAGNOSIS — Z7901 Long term (current) use of anticoagulants: Secondary | ICD-10-CM | POA: Insufficient documentation

## 2023-09-01 DIAGNOSIS — Z955 Presence of coronary angioplasty implant and graft: Secondary | ICD-10-CM | POA: Diagnosis not present

## 2023-09-01 DIAGNOSIS — E119 Type 2 diabetes mellitus without complications: Secondary | ICD-10-CM | POA: Diagnosis not present

## 2023-09-01 DIAGNOSIS — E1165 Type 2 diabetes mellitus with hyperglycemia: Secondary | ICD-10-CM | POA: Diagnosis not present

## 2023-09-01 DIAGNOSIS — Z952 Presence of prosthetic heart valve: Secondary | ICD-10-CM | POA: Diagnosis not present

## 2023-09-01 HISTORY — PX: CORONARY ANGIOGRAPHY: CATH118303

## 2023-09-01 LAB — GLUCOSE, CAPILLARY: Glucose-Capillary: 138 mg/dL — ABNORMAL HIGH (ref 70–99)

## 2023-09-01 SURGERY — CORONARY ANGIOGRAPHY (CATH LAB)
Anesthesia: Moderate Sedation | Laterality: Left

## 2023-09-01 MED ORDER — LIDOCAINE HCL (PF) 1 % IJ SOLN
INTRAMUSCULAR | Status: DC | PRN
  Administered 2023-09-01: 2 mL

## 2023-09-01 MED ORDER — MIDAZOLAM HCL 2 MG/2ML IJ SOLN
INTRAMUSCULAR | Status: DC | PRN
  Administered 2023-09-01: 1 mg via INTRAVENOUS

## 2023-09-01 MED ORDER — SODIUM CHLORIDE 0.9 % WEIGHT BASED INFUSION: 1.0000 mL/kg/h | INTRAVENOUS | Status: DC

## 2023-09-01 MED ORDER — MIDAZOLAM HCL 2 MG/2ML IJ SOLN
INTRAMUSCULAR | Status: AC
  Filled 2023-09-01: qty 2

## 2023-09-01 MED ORDER — LIDOCAINE HCL 1 % IJ SOLN
INTRAMUSCULAR | Status: AC
  Filled 2023-09-01: qty 20

## 2023-09-01 MED ORDER — SODIUM CHLORIDE 0.9% FLUSH: 3.0000 mL | INTRAVENOUS | Status: DC | PRN

## 2023-09-01 MED ORDER — HEPARIN SODIUM (PORCINE) 1000 UNIT/ML IJ SOLN
INTRAMUSCULAR | Status: DC | PRN
  Administered 2023-09-01: 4000 [IU] via INTRAVENOUS

## 2023-09-01 MED ORDER — FENTANYL CITRATE (PF) 100 MCG/2ML IJ SOLN
INTRAMUSCULAR | Status: DC | PRN
  Administered 2023-09-01: 25 ug via INTRAVENOUS

## 2023-09-01 MED ORDER — SODIUM CHLORIDE 0.9% FLUSH: 3.0000 mL | Freq: Two times a day (BID) | INTRAVENOUS | Status: DC

## 2023-09-01 MED ORDER — VERAPAMIL HCL 2.5 MG/ML IV SOLN
INTRAVENOUS | Status: AC
  Filled 2023-09-01: qty 2

## 2023-09-01 MED ORDER — SODIUM CHLORIDE 0.9 % IV SOLN: INTRAVENOUS | Status: DC

## 2023-09-01 MED ORDER — ONDANSETRON HCL 4 MG/2ML IJ SOLN: 4.0000 mg | Freq: Four times a day (QID) | INTRAMUSCULAR | Status: DC | PRN

## 2023-09-01 MED ORDER — SODIUM CHLORIDE 0.9 % IV SOLN: 250.0000 mL | INTRAVENOUS | Status: DC | PRN

## 2023-09-01 MED ORDER — HEPARIN (PORCINE) IN NACL 1000-0.9 UT/500ML-% IV SOLN
INTRAVENOUS | Status: DC | PRN
  Administered 2023-09-01 (×2): 500 mL

## 2023-09-01 MED ORDER — ASPIRIN 81 MG PO CHEW
81.0000 mg | CHEWABLE_TABLET | ORAL | Status: AC
  Administered 2023-09-01: 81 mg via ORAL

## 2023-09-01 MED ORDER — SODIUM CHLORIDE 0.9 % WEIGHT BASED INFUSION: 3.0000 mL/kg/h | INTRAVENOUS | Status: DC

## 2023-09-01 MED ORDER — HEPARIN (PORCINE) IN NACL 1000-0.9 UT/500ML-% IV SOLN
INTRAVENOUS | Status: AC
Start: 2023-09-01 — End: ?
  Filled 2023-09-01: qty 1000

## 2023-09-01 MED ORDER — SODIUM CHLORIDE 0.9 % WEIGHT BASED INFUSION
3.0000 mL/kg/h | INTRAVENOUS | Status: AC
  Administered 2023-09-01: 3 mL/kg/h via INTRAVENOUS

## 2023-09-01 MED ORDER — VERAPAMIL HCL 2.5 MG/ML IV SOLN
INTRAVENOUS | Status: DC | PRN
  Administered 2023-09-01: 2.5 mg via INTRA_ARTERIAL

## 2023-09-01 MED ORDER — HEPARIN SODIUM (PORCINE) 1000 UNIT/ML IJ SOLN
INTRAMUSCULAR | Status: AC
  Filled 2023-09-01: qty 10

## 2023-09-01 MED ORDER — FENTANYL CITRATE (PF) 100 MCG/2ML IJ SOLN
INTRAMUSCULAR | Status: AC
  Filled 2023-09-01: qty 2

## 2023-09-01 MED ORDER — ASPIRIN 81 MG PO CHEW
CHEWABLE_TABLET | ORAL | Status: AC
  Filled 2023-09-01: qty 1

## 2023-09-01 MED ORDER — IOHEXOL 300 MG/ML  SOLN
INTRAMUSCULAR | Status: DC | PRN
  Administered 2023-09-01: 52 mL

## 2023-09-01 MED ORDER — ACETAMINOPHEN 325 MG PO TABS: 650.0000 mg | ORAL_TABLET | ORAL | Status: DC | PRN

## 2023-09-01 SURGICAL SUPPLY — 10 items
CATH 5FR JL3.5 JR4 ANG PIG MP (CATHETERS) IMPLANT
DEVICE RAD TR BAND REGULAR (VASCULAR PRODUCTS) IMPLANT
DRAPE BRACHIAL (DRAPES) IMPLANT
GLIDESHEATH SLEND SS 6F .021 (SHEATH) IMPLANT
GUIDEWIRE INQWIRE 1.5J.035X260 (WIRE) IMPLANT
INQWIRE 1.5J .035X260CM (WIRE) ×1
PACK CARDIAC CATH (CUSTOM PROCEDURE TRAY) ×1 IMPLANT
PROTECTION STATION PRESSURIZED (MISCELLANEOUS) ×1
SET ATX-X65L (MISCELLANEOUS) IMPLANT
STATION PROTECTION PRESSURIZED (MISCELLANEOUS) IMPLANT

## 2023-09-01 NOTE — Interval H&P Note (Signed)
History and Physical Interval Note:  09/01/2023 10:18 AM  Dennis Bates  has presented today for surgery, with the diagnosis of L Cath  Chest pain  Unstable angina.  The various methods of treatment have been discussed with the patient and family. After consideration of risks, benefits and other options for treatment, the patient has consented to  Procedure(s): LEFT HEART CATH AND CORONARY ANGIOGRAPHY (Left) as a surgical intervention.  The patient's history has been reviewed, patient examined, no change in status, stable for surgery.  I have reviewed the patient's chart and labs.  Questions were answered to the patient's satisfaction.     Lorine Bears

## 2023-09-02 ENCOUNTER — Encounter: Payer: Self-pay | Admitting: Cardiovascular Disease

## 2023-09-02 LAB — GLUCOSE, CAPILLARY: Glucose-Capillary: 117 mg/dL — ABNORMAL HIGH (ref 70–99)

## 2023-09-05 ENCOUNTER — Ambulatory Visit (INDEPENDENT_AMBULATORY_CARE_PROVIDER_SITE_OTHER): Payer: Managed Care, Other (non HMO)

## 2023-09-05 DIAGNOSIS — Z7901 Long term (current) use of anticoagulants: Secondary | ICD-10-CM | POA: Diagnosis not present

## 2023-09-05 LAB — POCT INR: INR: 0.9 — AB (ref 2.0–3.0)

## 2023-09-05 MED ORDER — ENOXAPARIN SODIUM 80 MG/0.8ML IJ SOSY: PREFILLED_SYRINGE | INTRAMUSCULAR | 0 refills | Status: DC

## 2023-09-05 NOTE — Patient Instructions (Addendum)
Pre visit review using our clinic review tool, if applicable. No additional management support is needed unless otherwise documented below in the visit note.  Continue lovenox injections every 12 hours. Increase dose today to take 2 tablets  and increase dose tomorrow and the day after tomorrow to take 1 1/2 tablets and then continue 1 tablet daily except take 1 1/2 tablets on Monday and Friday. Recheck on Friday, 12/27.

## 2023-09-05 NOTE — Progress Notes (Signed)
Pt denies any changes but is still taking amoxicillin TID since knee surgery. Pt had coronary angiogram on 12/19 and was placed on a lovenox bridge by cardiology. Pt restarted warfarin at normal dosing on 12/20 per cardiology. Continue lovenox injections every 12 hours. Increase dose today to take 2 tablets  and increase dose tomorrow and the day after tomorrow to take 1 1/2 tablets and then continue 1 tablet daily except take 1 1/2 tablets on Monday and Friday. Recheck on Friday, 12/27. Pt has a nurse visit at [facility] for POCT INR and advised pt he will get a call from the coumadin clinic nurse with dosing instructions.   Sent in Lovenox prescription. Pt will not be able to pick up Lovenox until later today. Contacted [facility] and spoke with [place]. They do not have the injections in stock. Contacted [facility] and spoke with Joe, who reports they do have 7 syringes in stock. LVM for pt to let him know where script was sent. Sent in script.

## 2023-09-09 ENCOUNTER — Ambulatory Visit: Payer: Managed Care, Other (non HMO)

## 2023-09-09 ENCOUNTER — Ambulatory Visit (INDEPENDENT_AMBULATORY_CARE_PROVIDER_SITE_OTHER): Payer: Self-pay

## 2023-09-09 DIAGNOSIS — Z7901 Long term (current) use of anticoagulants: Secondary | ICD-10-CM

## 2023-09-09 LAB — POCT INR: INR: 2.2 (ref 2.0–3.0)

## 2023-09-09 NOTE — Progress Notes (Signed)
Pt had nurse visit today for POCT INR. Result was relayed to this nurse Stop Lovenox injections.Increase dose today to take 2 tablets and then continue 1 tablet daily except take 1 1/2 tablets on Monday and Friday. Recheck in 3 weeks.  LVM with dosing instructions and next test date.

## 2023-09-09 NOTE — Progress Notes (Signed)
Pt is here for INR check. Patient tolerated well.   Result was sent to Sherrie George, RN.

## 2023-09-09 NOTE — Patient Instructions (Addendum)
Pre visit review using our clinic review tool, if applicable. No additional management support is needed unless otherwise documented below in the visit note.  Stop Lovenox injections.Increase dose today to take 2 tablets and then continue 1 tablet daily except take 1 1/2 tablets on Monday and Friday. Recheck in 3 weeks.

## 2023-09-23 ENCOUNTER — Ambulatory Visit: Payer: Managed Care, Other (non HMO) | Attending: Medical | Admitting: Medical

## 2023-10-05 ENCOUNTER — Other Ambulatory Visit: Payer: Self-pay | Admitting: Family Medicine

## 2023-10-05 DIAGNOSIS — K8020 Calculus of gallbladder without cholecystitis without obstruction: Secondary | ICD-10-CM

## 2023-10-07 ENCOUNTER — Ambulatory Visit: Payer: Managed Care, Other (non HMO)

## 2023-10-07 DIAGNOSIS — Z7901 Long term (current) use of anticoagulants: Secondary | ICD-10-CM | POA: Diagnosis not present

## 2023-10-07 LAB — POCT INR: INR: 2.4 (ref 2.0–3.0)

## 2023-10-07 NOTE — Progress Notes (Signed)
Increase dose today to take 2 tablets and then change weekly dose to take 1 tablet daily except take 1 1/2 tablets on Monday, Wednesday and Friday. Recheck in 2 weeks.

## 2023-10-07 NOTE — Patient Instructions (Addendum)
Pre visit review using our clinic review tool, if applicable. No additional management support is needed unless otherwise documented below in the visit note.  Increase dose today to take 2 tablets and then change weekly dose to take 1 tablet daily except take 1 1/2 tablets on Monday, Wednesday and Friday. Recheck in 2 weeks.

## 2023-10-25 ENCOUNTER — Ambulatory Visit: Payer: Managed Care, Other (non HMO)

## 2023-10-25 DIAGNOSIS — Z7901 Long term (current) use of anticoagulants: Secondary | ICD-10-CM | POA: Diagnosis not present

## 2023-10-25 LAB — POCT INR: INR: 5.5 — AB (ref 2.0–3.0)

## 2023-10-25 MED ORDER — WARFARIN SODIUM 10 MG PO TABS: ORAL_TABLET | ORAL | 1 refills | Status: DC

## 2023-10-25 NOTE — Progress Notes (Cosign Needed)
Pt reports eating poppy seed dressing twice in the last week. Poppy seeds can increase the effects of warfarin. Pt reports he has stopped taking Jardiance. Pt denies any other changes. Pt denies any bleeding or abnormal bruising. Advised if any s/s of abnormal bruising or bleeding to go to ER or call 911. Pt verbalized understanding.  Hold dose today and hold dose tomorrow and then change weekly dose to take 1 tablet daily except take 1 1/2 tablets on Monday. Recheck in 2 weeks.    Pt is compliant with warfarin management and PCP apts.  Sent in refill of warfarin to requested pharmacy.    Medical screening examination/treatment/procedure(s) were performed by non-physician practitioner and as supervising physician I was immediately available for consultation/collaboration.  I agree with above. Jacinta Shoe, MD

## 2023-10-25 NOTE — Patient Instructions (Addendum)
Pre visit review using our clinic review tool, if applicable. No additional management support is needed unless otherwise documented below in the visit note.  Hold dose today and hold dose tomorrow and then change weekly dose to take 1 tablet daily except take 1 1/2 tablets on Monday. Recheck in 2 weeks.

## 2023-11-04 ENCOUNTER — Ambulatory Visit: Payer: Managed Care, Other (non HMO)

## 2023-11-04 DIAGNOSIS — Z7901 Long term (current) use of anticoagulants: Secondary | ICD-10-CM

## 2023-11-04 LAB — POCT INR: INR: 3.7 — AB (ref 2.0–3.0)

## 2023-11-04 NOTE — Patient Instructions (Addendum)
 Pre visit review using our clinic review tool, if applicable. No additional management support is needed unless otherwise documented below in the visit note.  Reduce dose tomorrow to take 1/2 tablet and then change weekly dose to take 1 tablet daily.. Recheck in 3 weeks.

## 2023-11-04 NOTE — Progress Notes (Signed)
 Pt already took warfarin dose today. Reduce dose tomorrow to take 1/2 tablet and then change weekly dose to take 1 tablet daily.. Recheck in 3 weeks.

## 2023-11-08 ENCOUNTER — Ambulatory Visit: Payer: Managed Care, Other (non HMO) | Admitting: Family Medicine

## 2023-11-08 VITALS — BP 106/68 | HR 79 | Temp 97.8°F | Ht 67.0 in | Wt 178.8 lb

## 2023-11-08 DIAGNOSIS — J209 Acute bronchitis, unspecified: Secondary | ICD-10-CM

## 2023-11-08 MED ORDER — HYDROCODONE BIT-HOMATROP MBR 5-1.5 MG/5ML PO SOLN: 5.0000 mL | Freq: Three times a day (TID) | ORAL | 0 refills | Status: DC | PRN

## 2023-11-08 NOTE — Progress Notes (Signed)
 Established Patient Office Visit  Subjective   Patient ID: Dennis Bates, male    DOB: [birth year]  Age: 63 y.o. MRN: [id number]  Chief Complaint  Patient presents with   Cough    Pt reports he was exposed to covid 2 wks ago. Sx started 2/13/025. Worsen last Thursday. Did had some productive cough last wk. Currently dry cough. Coughing with SOB. Took NyQuil.    Nasal Congestion    Pt reports runny nose.    Headache   Fatigue    HPI   Dennis Bates is seen for a cough about 2 weeks duration.  He thinks he may have had a sick contact just prior to that.  He initially had some productive cough now dry.  Cough is interfering with sleep.  He has been having to sleep sitting up.  No fever.  Took some NyQuil with limited success.  Denies any dyspnea.  No hemoptysis.  No acute or chronic sinusitis symptoms.  Does have some nasal congestion initially.  Does have some associated fatigue.  Not aware of any wheezing.  Past Medical History:  Diagnosis Date   Aortic stenosis due to bicuspid aortic valve 09/2014   Severe aortic stenosis of bicuspid valve - s/pAVR with Bentall ( 09/2015)   Bell's palsy    LAST EPISODE 09-2015   CAD S/P percutaneous coronary angioplasty 09/20/2014   a. inferolat STEMI ->> LHC-Angio: Proximal/ostial LAD 20%, OM2 99% -->> PCI: 3mm x 16 mm Promus Premier DES to the OM2.(~3.5 mm)   Diabetes mellitus type 2, controlled, with complications (HCC)    History of mechanical aortic valve replacement 10/09/2015   after 1 yr DAPT for Inferolateral STEMI. (Dr. 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   Shoulder arthritis 07/11/2020   Rt Severe GH DJD xray Sep 2021   ST elevation myocardial infarction (STEMI) of inferior wall (HCC) 09/20/2014   99 % occluded Cx-OM2 Promus DES 3.0 mm x 16 mm (3.5 mm)   Past Surgical History:  Procedure Laterality Date   BENTALL PROCEDURE N/A 10/09/2015   Procedure: BENTALL  PROCEDURE (using a St Jude mechanical valve, size 23);  Surgeon: Alleen Borne, MD;  Location: [facility] OR;  Service: Open Heart Surgery;  Laterality: N/A;  CIRC ARRESTNEEDS RIGHT RADIAL A-LINE   COLONOSCOPY WITH PROPOFOL N/A 01/21/2020   Procedure: COLONOSCOPY WITH PROPOFOL;  Surgeon: Lynann Bologna, MD;  Location: [facility] ENDOSCOPY;  Service: Endoscopy;  Laterality: N/A;   CORONARY ANGIOGRAPHY Left 09/01/2023   Procedure: CORONARY ANGIOGRAPHY;  Surgeon: Iran Ouch, MD;  Location: [facility] INVASIVE CV LAB;  Service: Cardiovascular;  Laterality: Left;   ENTEROSCOPY N/A 01/24/2020   Procedure: ENTEROSCOPY;  Surgeon: Sherrilyn Rist, MD;  Location: [facility] ENDOSCOPY;  Service: Gastroenterology;  Laterality: N/A;   ESOPHAGOGASTRODUODENOSCOPY (EGD) WITH PROPOFOL N/A 01/21/2020   Procedure: ESOPHAGOGASTRODUODENOSCOPY (EGD) WITH PROPOFOL;  Surgeon: Lynann Bologna, MD;  Location: [facility] ENDOSCOPY;  Service: Endoscopy;  Laterality: N/A;   GIVENS CAPSULE STUDY N/A 01/22/2020   Procedure: GIVENS CAPSULE STUDY;  Surgeon: Sherrilyn Rist, MD;  Location: [facility] ENDOSCOPY;  Service: Gastroenterology;  Laterality: N/A;   IRRIGATION AND DEBRIDEMENT KNEE Right 01/24/2023   Procedure: IRRIGATION AND DEBRIDEMENT KNEE;  Surgeon: Jodi Geralds, MD;  Location: [facility] OR;  Service: Orthopedics;  Laterality: Right;   KNEE ARTHROSCOPY W/ ACL RECONSTRUCTION Right    90's; "2 ATHROSCOPIC , 2 REBUILDS"   LEFT HEART CATHETERIZATION WITH  CORONARY ANGIOGRAM N/A 09/20/2014   Procedure: LEFT HEART CATHETERIZATION WITH CORONARY ANGIOGRAM;  Surgeon: Marykay Lex, MD;  Location: [facility] CATH LAB;  Proximal/ostial LAD 20%, OM2 99% -->> aspiration thrombectomy and DES PCI:    PERCUTANEOUS CORONARY STENT INTERVENTION (PCI-S)  09/20/2014   Aspiration thrombectomy and DES PCI- OM 2(m-dCx): Promus Premier DES 3.0 mm x 16 mm (3.5 mm)   RIGHT/LEFT HEART CATH AND CORONARY ANGIOGRAPHY  09/2015   Dr. Nicki Guadalajara: Widely patent OM 2 stent.  Mild disease in RCA and  LAD.Relatively normal right heart cath pressures.  Normal cardiac output.   TEE WITHOUT CARDIOVERSION N/A 10/09/2015   Procedure: TRANSESOPHAGEAL ECHOCARDIOGRAM (TEE);  Surgeon: Alleen Borne, MD;  Location: [facility] OR;  Service: Open Heart Surgery;  Laterality: N/A;   TEE WITHOUT CARDIOVERSION N/A 01/21/2023   Procedure: TRANSESOPHAGEAL ECHOCARDIOGRAM;  Surgeon: Pricilla Riffle, MD;  Location: [facility] INVASIVE CV LAB;  Service: Cardiovascular;  Laterality: N/A;   TOTAL KNEE ARTHROPLASTY Right 12/09/2017   Procedure: RIGHT TOTAL KNEE ARTHROPLASTY REMOVAL OF HARDWARE RIGHT KNEE;  Surgeon: Jodi Geralds, MD;  Location: [facility] ORS;  Service: Orthopedics;  Laterality: Right;   TOTAL KNEE REVISION Right 01/24/2023   Procedure: TOTAL KNEE REVISION;  Surgeon: Jodi Geralds, MD;  Location: [facility] OR;  Service: Orthopedics;  Laterality: Right;   TRANSTHORACIC ECHOCARDIOGRAM  02/2016   Post AVR: mechanical: AVR without obstuction. LVEF improved to 65-70%.   TRANSTHORACIC ECHOCARDIOGRAM  09/2014, 10/2014   Probable bicuspid aortic valve: a. mod-sev by 2D ECHO 09/2014. AVA 0.9cm^2; b) 10/2014 Echo:. Severe AS Mn-Pk Gradient 41-71 mmHg, AVA ~0.79 cm2, EF 60-65%   TRANSTHORACIC ECHOCARDIOGRAM  12/2019    Normal WM. Gr 2 DD. Mild LA dilation. 23 mm St. Jude Mechanical AoV well seated.  Trivial AI.     reports that he has never smoked. He quit smokeless tobacco use about 38 years ago.  His smokeless tobacco use included chew. He reports current alcohol use. He reports that he does not use drugs. family history includes Heart disease in his mother. No Known Allergies  Review of Systems  Constitutional:  Negative for chills and fever.  HENT:  Negative for sinus pain.   Respiratory:  Positive for cough. Negative for hemoptysis, sputum production, shortness of breath and wheezing.   Cardiovascular:  Negative for chest pain.      Objective:     BP 106/68 (BP Location: Left Arm, Patient Position: Sitting, Cuff Size: Large)   Pulse  79   Temp 97.8 F (36.6 C) (Oral)   Ht 5\' 7"  (1.702 m)   Wt 178 lb 12.8 oz (81.1 kg)   SpO2 98%   BMI 28.00 kg/m  BP Readings from Last 3 Encounters:  11/08/23 106/68  09/01/23 121/76  08/25/23 (!) 157/88   Wt Readings from Last 3 Encounters:  11/08/23 178 lb 12.8 oz (81.1 kg)  09/01/23 176 lb 11.2 oz (80.2 kg)  08/25/23 182 lb 6.4 oz (82.7 kg)      Physical Exam Vitals reviewed.  Constitutional:      General: He is not in acute distress.    Appearance: He is not ill-appearing.  Cardiovascular:     Rate and Rhythm: Normal rate.  Pulmonary:     Effort: Pulmonary effort is normal.     Breath sounds: Normal breath sounds. No wheezing or rales.  Neurological:     Mental Status: He is alert.      No results found for any visits on 11/08/23.  The ASCVD Risk score (Arnett DK, et al., 2019) failed to calculate for the following reasons:   Risk score cannot be calculated because patient has a medical history suggesting prior/existing ASCVD    Assessment & Plan:   Acute bronchitis.  Suspect probable viral trigger.  Nonfocal exam.  Does not have any red flags such as fever.  Cough has been particularly bothersome at night.  He tried his wife's leftover Tessalon which did not seem to help much.  Will send in limited Hycodan cough syrup 1 teaspoon nightly for severe cough.  Follow-up as needed  Evelena Peat, MD

## 2023-11-16 ENCOUNTER — Other Ambulatory Visit (HOSPITAL_COMMUNITY): Payer: Self-pay

## 2023-11-16 ENCOUNTER — Telehealth: Payer: Self-pay | Admitting: Pharmacy Technician

## 2023-11-16 NOTE — Telephone Encounter (Signed)
 Pharmacy Patient Advocate Encounter   Received notification from Onbase that prior authorization for [facility] COUGH SYRUP is required/requested.   Insurance verification completed.   The patient is insured through [employer] .   Per test claim: PA required; PA submitted to above mentioned insurance via CoverMyMeds Key/confirmation #/EOC [id number] Status is pending

## 2023-11-22 NOTE — Telephone Encounter (Signed)
 Pharmacy Patient Advocate Encounter  Received notification from [employer] that Prior Authorization for [facility] COUGH SYRUP has been DENIED.  Full denial letter will be uploaded to the media tab. See denial reason below.   PA #/Case ID/Reference #: [id number]

## 2023-11-23 ENCOUNTER — Other Ambulatory Visit: Payer: Self-pay | Admitting: Family Medicine

## 2023-11-25 ENCOUNTER — Ambulatory Visit: Payer: Managed Care, Other (non HMO)

## 2023-11-25 DIAGNOSIS — Z7901 Long term (current) use of anticoagulants: Secondary | ICD-10-CM | POA: Diagnosis not present

## 2023-11-25 LAB — POCT INR: INR: 2.1 (ref 2.0–3.0)

## 2023-11-25 NOTE — Progress Notes (Signed)
 Pt missed warfarin yesterday so he took that dose with his normal dose today. Due to this factor and pt being supratherapeutic the last two INR checks there will not be a change in weekly dose and we will recheck in 2 weeks. Continue 1 tablet daily. Recheck in 2 weeks.

## 2023-11-25 NOTE — Patient Instructions (Addendum)
Pre visit review using our clinic review tool, if applicable. No additional management support is needed unless otherwise documented below in the visit note.  Continue 1 tablet daily . Recheck in 3 weeks.

## 2023-11-29 ENCOUNTER — Encounter: Payer: Self-pay | Admitting: Family Medicine

## 2023-12-02 ENCOUNTER — Ambulatory Visit: Payer: Managed Care, Other (non HMO) | Admitting: Family Medicine

## 2023-12-09 ENCOUNTER — Ambulatory Visit

## 2023-12-09 DIAGNOSIS — Z7901 Long term (current) use of anticoagulants: Secondary | ICD-10-CM | POA: Diagnosis not present

## 2023-12-09 LAB — POCT INR: INR: 2.3 (ref 2.0–3.0)

## 2023-12-09 NOTE — Patient Instructions (Addendum)
 Pre visit review using our clinic review tool, if applicable. No additional management support is needed unless otherwise documented below in the visit note.  Increase dose today to take 1 1/2 tablets and then change weekly dosing 1 tablet daily except take 1 1/2 tablets on Tuesday. Recheck in 4 weeks.

## 2023-12-09 NOTE — Progress Notes (Signed)
 Increase dose today to take 1 1/2 tablets and then change weekly dosing 1 tablet daily except take 1 1/2 tablets on Tuesday. Recheck in 4 weeks.

## 2024-01-06 ENCOUNTER — Ambulatory Visit

## 2024-01-06 DIAGNOSIS — Z7901 Long term (current) use of anticoagulants: Secondary | ICD-10-CM

## 2024-01-06 LAB — POCT INR: INR: 4.1 — AB (ref 2.0–3.0)

## 2024-01-06 NOTE — Patient Instructions (Addendum)
 Pre visit review using our clinic review tool, if applicable. No additional management support is needed unless otherwise documented below in the visit note.  Hold dose tomorrow and then continue 1 tablet daily except take 1 1/2 tablets on Tuesday. Recheck in 2 weeks.

## 2024-01-06 NOTE — Progress Notes (Signed)
 Pt reports GI problems, vomiting and diarrhea for 3 days ending 2 days ago. This will increase INR. Due to this no change in weekly dose will be made. Will recheck in 2 weeks.  Hold dose tomorrow and then continue 1 tablet daily except take 1 1/2 tablets on Tuesday. Recheck in 2 weeks.

## 2024-01-22 ENCOUNTER — Other Ambulatory Visit: Payer: Self-pay | Admitting: Family Medicine

## 2024-01-23 ENCOUNTER — Other Ambulatory Visit: Payer: Self-pay | Admitting: Family Medicine

## 2024-01-23 ENCOUNTER — Other Ambulatory Visit: Payer: Self-pay | Admitting: Infectious Disease

## 2024-01-23 DIAGNOSIS — K8020 Calculus of gallbladder without cholecystitis without obstruction: Secondary | ICD-10-CM

## 2024-01-24 ENCOUNTER — Ambulatory Visit

## 2024-01-24 DIAGNOSIS — Z7901 Long term (current) use of anticoagulants: Secondary | ICD-10-CM | POA: Diagnosis not present

## 2024-01-24 LAB — POCT INR: INR: 4 — AB (ref 2.0–3.0)

## 2024-01-24 NOTE — Patient Instructions (Addendum)
 Pre visit review using our clinic review tool, if applicable. No additional management support is needed unless otherwise documented below in the visit note.  Hold dose tomorrow and then change weekly dose to take 1 tablet daily. Recheck in 3 weeks.

## 2024-01-24 NOTE — Progress Notes (Addendum)
 PT denies any changes. Pt already took warfarin dose today. Pt denies any s/s of abnormal bruising or bleeding.  Advised if any s/s of abnormal bruising or bleeding to go to ER. Pt verbalized understanding.  Hold dose tomorrow and then change weekly dose to take 1 tablet daily. Recheck in 3 weeks.

## 2024-01-25 NOTE — Telephone Encounter (Signed)
 Spoke with the patient and he stated he has been taking the medication daily except for this week when he ran out of the Rx.  Patient is aware this will be sent to PCP  and he will be contacted if there is a problem, otherwise will await a response from the pharmacy.

## 2024-02-08 ENCOUNTER — Ambulatory Visit: Admitting: Infectious Disease

## 2024-02-17 ENCOUNTER — Ambulatory Visit

## 2024-02-17 DIAGNOSIS — Z7901 Long term (current) use of anticoagulants: Secondary | ICD-10-CM | POA: Diagnosis not present

## 2024-02-17 LAB — POCT INR: INR: 4.5 — AB (ref 2.0–3.0)

## 2024-02-17 NOTE — Progress Notes (Signed)
 Pt reports he forgot to change his dose to 1 tablet daily and left it as the old dosing of 1 tablet daily except take 1 1/2 tablets on Tuesday. Pt denies any bleeding currently but did bite his tongue last week and reports it took 2 days to stop the bleeding. Advised pt if any s/s of bleeding or abnormal bruising to go to ER. Pt verbalized understanding. Pt also reported he has not been eating his normal spinach.  Pt already took warfarin dose today. Hold dose tomorrow and then change weekly dose to take 1 tablet daily except take 1/2 Sunday. Recheck in 2 weeks at [facility].

## 2024-02-17 NOTE — Patient Instructions (Addendum)
 Pre visit review using our clinic review tool, if applicable. No additional management support is needed unless otherwise documented below in the visit note.  Hold dose tomorrow and then change weekly dose to take 1 tablet daily except take 1/2 Sunday. Recheck in 2 weeks at [facility] at [facility].

## 2024-02-19 ENCOUNTER — Other Ambulatory Visit: Payer: Self-pay | Admitting: Family Medicine

## 2024-02-28 NOTE — Progress Notes (Signed)
 Subjective:  Chief complaint: follow-up for prosthetic joint infection   Patient ID: Dennis Bates, male    DOB: [birth year], 63 y.o.   MRN: [id number]  HPI  Discussed the use of AI scribe software for clinical note transcription with the patient, who gave verbal consent to proceed.  History of Present Illness   Dennis Bates is a 63 year old male who presents for follow-up of enterococcus faecalis bacteremia and prosthetic joint infection.  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 A transesophageal echocardiogram did not show evidence of endocarditis. He underwent an incision and drainage of his knee with a poly exchange, during which suture material extending to the bone was removed.  He was treated with intravenous ampicillin  and continuous infusion along with two grams of ceftriaxone  every 12 hours. His home labs in June 2024 showed a significant increase in creatinine from normal levels to 8.26, which was alarming. However, his kidney function was later confirmed to be normal--we believe. He has not been seen in follow-up by me for more than a year.  Currently, he is on amoxicillin  three times a day. His knee has been hurting more over the past month or two, but it has not been warm. He attributes the increased pain to more use, including participating in a 5K walk two months ago. He has not seen his orthopedist since stopping physical therapy after the surgery over a year ago. He golfs with his sons, which involves walking up and down hills, and this sometimes causes discomfort in his knee.       Past Medical History:  Diagnosis Date   Aortic stenosis due to bicuspid aortic valve 09/2014   Severe aortic stenosis of bicuspid valve - s/pAVR with Bentall ( 09/2015)   Bell's palsy    LAST EPISODE 09-2015   CAD S/P percutaneous  coronary angioplasty 09/20/2014   a. inferolat STEMI ->> LHC-Angio: Proximal/ostial LAD 20%, OM2 99% -->> PCI: 3mm x 16 mm Promus Premier DES to the OM2.(~3.5 mm)   Diabetes mellitus type 2, controlled, with complications (HCC)    History of mechanical aortic valve replacement 10/09/2015   after 1 yr DAPT for Inferolateral STEMI. (Dr. 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   Shoulder arthritis 07/11/2020   Rt Severe GH DJD xray Sep 2021   ST elevation myocardial infarction (STEMI) of inferior wall (HCC) 09/20/2014   99 % occluded Cx-OM2 Promus DES 3.0 mm x 16 mm (3.5 mm)    Past Surgical History:  Procedure Laterality Date   BENTALL PROCEDURE N/A 10/09/2015   Procedure: BENTALL PROCEDURE (using a St Jude mechanical valve, size 23);  Surgeon: Bartley Lightning, MD;  Location: [facility] OR;  Service: Open Heart Surgery;  Laterality: N/A;  CIRC ARRESTNEEDS RIGHT RADIAL A-LINE   COLONOSCOPY WITH PROPOFOL  N/A 01/21/2020   Procedure: COLONOSCOPY WITH PROPOFOL ;  Surgeon: Lajuan Pila, MD;  Location: [facility] ENDOSCOPY;  Service: Endoscopy;  Laterality: N/A;   CORONARY ANGIOGRAPHY Left 09/01/2023   Procedure: CORONARY ANGIOGRAPHY;  Surgeon: Wenona Hamilton, MD;  Location: [facility] INVASIVE CV LAB;  Service: Cardiovascular;  Laterality: Left;   ENTEROSCOPY N/A 01/24/2020   Procedure: ENTEROSCOPY;  Surgeon: Albertina Hugger, MD;  Location: [facility] ENDOSCOPY;  Service: Gastroenterology;  Laterality: N/A;   ESOPHAGOGASTRODUODENOSCOPY (EGD) WITH PROPOFOL   N/A 01/21/2020   Procedure: ESOPHAGOGASTRODUODENOSCOPY (EGD) WITH PROPOFOL ;  Surgeon: Lajuan Pila, MD;  Location: [facility] ENDOSCOPY;  Service: Endoscopy;  Laterality: N/A;   GIVENS CAPSULE STUDY N/A 01/22/2020   Procedure: GIVENS CAPSULE STUDY;  Surgeon: Albertina Hugger, MD;  Location: [facility] ENDOSCOPY;  Service: Gastroenterology;  Laterality: N/A;   IRRIGATION AND DEBRIDEMENT KNEE Right 01/24/2023    Procedure: IRRIGATION AND DEBRIDEMENT KNEE;  Surgeon: Neil Balls, MD;  Location: [facility] OR;  Service: Orthopedics;  Laterality: Right;   KNEE ARTHROSCOPY W/ ACL RECONSTRUCTION Right    90's; 2 ATHROSCOPIC , 2 REBUILDS   LEFT HEART CATHETERIZATION WITH CORONARY ANGIOGRAM N/A 09/20/2014   Procedure: LEFT HEART CATHETERIZATION WITH CORONARY ANGIOGRAM;  Surgeon: Arleen Lacer, MD;  Location: [facility] CATH LAB;  Proximal/ostial LAD 20%, OM2 99% -->> aspiration thrombectomy and DES PCI:    PERCUTANEOUS CORONARY STENT INTERVENTION (PCI-S)  09/20/2014   Aspiration thrombectomy and DES PCI- OM 2(m-dCx): Promus Premier DES 3.0 mm x 16 mm (3.5 mm)   RIGHT/LEFT HEART CATH AND CORONARY ANGIOGRAPHY  09/2015   Dr. Magnus Schuller: Widely patent OM 2 stent.  Mild disease in RCA and LAD.Relatively normal right heart cath pressures.  Normal cardiac output.   TEE WITHOUT CARDIOVERSION N/A 10/09/2015   Procedure: TRANSESOPHAGEAL ECHOCARDIOGRAM (TEE);  Surgeon: Bartley Lightning, MD;  Location: [facility] OR;  Service: Open Heart Surgery;  Laterality: N/A;   TEE WITHOUT CARDIOVERSION N/A 01/21/2023   Procedure: TRANSESOPHAGEAL ECHOCARDIOGRAM;  Surgeon: Elmyra Haggard, MD;  Location: [facility] INVASIVE CV LAB;  Service: Cardiovascular;  Laterality: N/A;   TOTAL KNEE ARTHROPLASTY Right 12/09/2017   Procedure: RIGHT TOTAL KNEE ARTHROPLASTY REMOVAL OF HARDWARE RIGHT KNEE;  Surgeon: Neil Balls, MD;  Location: [facility] ORS;  Service: Orthopedics;  Laterality: Right;   TOTAL KNEE REVISION Right 01/24/2023   Procedure: TOTAL KNEE REVISION;  Surgeon: Neil Balls, MD;  Location: [facility] OR;  Service: Orthopedics;  Laterality: Right;   TRANSTHORACIC ECHOCARDIOGRAM  02/2016   Post AVR: mechanical: AVR without obstuction. LVEF improved to 65-70%.   TRANSTHORACIC ECHOCARDIOGRAM  09/2014, 10/2014   Probable bicuspid aortic valve: a. mod-sev by 2D ECHO 09/2014. AVA 0.9cm^2; b) 10/2014 Echo:. Severe AS Mn-Pk Gradient 41-71 mmHg, AVA ~0.79 cm2, EF 60-65%   TRANSTHORACIC  ECHOCARDIOGRAM  12/2019    Normal WM. Gr 2 DD. Mild LA dilation. 23 mm St. Jude Mechanical AoV well seated.  Trivial AI.     Family History  Problem Relation Age of Onset   Heart disease Mother    CAD Neg Hx        per wife      Social History   Socioeconomic History   Marital status: Married    Spouse name: Not on file   Number of children: Not on file   Years of education: Not on file   Highest education level: Bachelor's degree (e.g., BA, AB, BS)  Occupational History   Occupation: [employer]  Tobacco Use   Smoking status: Never   Smokeless tobacco: Former    Types: Chew    Quit date: 09/01/1985  Vaping Use   Vaping status: Never Used  Substance and Sexual Activity   Alcohol use: Yes    Alcohol/week: 0.0 standard drinks of alcohol    Comment: socially   Drug use: No   Sexual activity: Not on file  Other Topics Concern   Not on file  Social History Narrative   Not on file   Social Drivers of Health   Financial  Resource Strain: Medium Risk (11/08/2023)   Overall Financial Resource Strain (CARDIA)    Difficulty of Paying Living Expenses: Somewhat hard  Food Insecurity: No Food Insecurity (11/08/2023)   Hunger Vital Sign    Worried About Running Out of Food in the Last Year: Never true    Ran Out of Food in the Last Year: Never true  Transportation Needs: No Transportation Needs (11/08/2023)   PRAPARE - Administrator, Civil Service (Medical): No    Lack of Transportation (Non-Medical): No  Physical Activity: Insufficiently Active (11/08/2023)   Exercise Vital Sign    Days of Exercise per Week: 1 day    Minutes of Exercise per Session: 20 min  Stress: Stress Concern Present (11/08/2023)   [employer] of Occupational Health - Occupational Stress Questionnaire    Feeling of Stress : To some extent  Social Connections: Socially Integrated (11/08/2023)   Social Connection and Isolation Panel    Frequency of Communication with Friends and Family: More  than three times a week    Frequency of Social Gatherings with Friends and Family: Twice a week    Attends Religious Services: More than 4 times per year    Active Member of [employer] or Organizations: Yes    Attends Engineer, structural: More than 4 times per year    Marital Status: Married    No Known Allergies   Current Outpatient Medications:    acetaminophen  (TYLENOL ) 325 MG tablet, Take 2 tablets (650 mg total) by mouth every 6 (six) hours as needed for mild pain. (Patient taking differently: Take 325 mg by mouth every 6 (six) hours as needed for mild pain (pain score 1-3).), Disp: , Rfl:    amoxicillin  (AMOXIL ) 500 MG capsule, TAKE 1 CAPSULE BY MOUTH THREE TIMES DAILY, Disp: 90 capsule, Rfl: 0   B Complex-C (SUPER B COMPLEX PO), Take 1 capsule by mouth daily., Disp: , Rfl:    carvedilol  (COREG ) 3.125 MG tablet, Take 1 tablet by mouth twice daily, Disp: 180 tablet, Rfl: 0   empagliflozin  (JARDIANCE ) 25 MG TABS tablet, Take 1 tablet (25 mg total) by mouth daily before breakfast., Disp: 30 tablet, Rfl: 11   enoxaparin  (LOVENOX ) 80 MG/0.8ML injection, INJECT ONCE THIS EVENING AND THEN INJECT EVERY 12 HOURS ON 12/24, 12/25, AND 12/26 AS DIRECTED BY ANTICOAGULATION CLINIC, Disp: 5.6 mL, Rfl: 0   gemfibrozil  (LOPID ) 600 MG tablet, TAKE 1 TABLET BY MOUTH IN THE MORNING AND AT BEDTIME, Disp: 180 tablet, Rfl: 0   HYDROcodone  bit-homatropine (HYCODAN) 5-1.5 MG/5ML syrup, Take 5 mLs by mouth every 8 (eight) hours as needed for cough., Disp: 120 mL, Rfl: 0   losartan  (COZAAR ) 25 MG tablet, Take 1 tablet (25 mg total) by mouth daily., Disp: 90 tablet, Rfl: 3   metFORMIN  (GLUCOPHAGE ) 1000 MG tablet, TAKE 1 TABLET BY MOUTH TWICE DAILY WITH MEALS, Disp: 180 tablet, Rfl: 0   Multiple Vitamin (MULTIVITAMIN WITH MINERALS) TABS tablet, Take 1 tablet by mouth daily., Disp: , Rfl:    rosuvastatin  (CRESTOR ) 20 MG tablet, TAKE 1 TABLET BY MOUTH ONCE DAILY AT BEDTIME, Disp: 90 tablet, Rfl: 0   ursodiol   (ACTIGALL ) 300 MG capsule, TAKE 1 CAPSULE BY MOUTH TWICE A DAY, Disp: 180 capsule, Rfl: 1   warfarin (COUMADIN ) 10 MG tablet, TAKE 1 TABLET BY MOUTH DAILY EXCEPT TAKE  1 1/2  TABLETS ON MONDAYS AND FRIDAYS OR AS DIRECTED  BY ANTICOAGULATION CLINIC. PT NEEDS APT WITH PCP., Disp: 110 tablet, Rfl: 1  Review of Systems  Constitutional:  Negative for activity change, appetite change, chills, diaphoresis, fatigue, fever and unexpected weight change.  HENT:  Negative for congestion, rhinorrhea, sinus pressure, sneezing, sore throat and trouble swallowing.   Eyes:  Negative for photophobia and visual disturbance.  Respiratory:  Negative for cough, chest tightness, shortness of breath, wheezing and stridor.   Cardiovascular:  Negative for chest pain, palpitations and leg swelling.  Gastrointestinal:  Negative for abdominal distention, abdominal pain, anal bleeding, blood in stool, constipation, diarrhea, nausea and vomiting.  Genitourinary:  Negative for difficulty urinating, dysuria, flank pain and hematuria.  Musculoskeletal:  Negative for arthralgias, back pain, gait problem, joint swelling and myalgias.  Skin:  Negative for color change, pallor, rash and wound.  Neurological:  Negative for dizziness, tremors, weakness and light-headedness.  Hematological:  Negative for adenopathy. Does not bruise/bleed easily.  Psychiatric/Behavioral:  Negative for agitation, behavioral problems, confusion, decreased concentration, dysphoric mood and sleep disturbance.        Objective:   Physical Exam Constitutional:      Appearance: He is well-developed.  HENT:     Head: Normocephalic and atraumatic.   Eyes:     Conjunctiva/sclera: Conjunctivae normal.    Cardiovascular:     Rate and Rhythm: Normal rate and regular rhythm.  Pulmonary:     Effort: Pulmonary effort is normal. No respiratory distress.     Breath sounds: No wheezing.  Abdominal:     General: There is no distension.     Palpations:  Abdomen is soft.   Musculoskeletal:        General: No tenderness. Normal range of motion.     Cervical back: Normal range of motion and neck supple.   Skin:    General: Skin is warm and dry.     Coloration: Skin is not pale.     Findings: No erythema or rash.   Neurological:     General: No focal deficit present.     Mental Status: He is alert and oriented to person, place, and time.   Psychiatric:        Mood and Affect: Mood normal.        Behavior: Behavior normal.        Thought Content: Thought content normal.        Judgment: Judgment normal.           Assessment & Plan:   Assessment and Plan    Prosthetic joint infection, right knee managed with IND and poly exchange. Recent pain likely from activity, not infection. On oral amoxicillin , plan to stop if labs are reassuring.  --check ESR, CRP, BMP w GFR, CBC w diff. - Discontinue antibiotics if labs are reassuring. - Ensure amoxicillin  availability for flare-ups. - Monitor for knee pain, swelling, or warmth. - Consult orthopedist Neil Balls if symptoms worsen.  Enterococcus faecalis bacteremia Bacteremia linked to prosthetic joint infection and aortic valve replacement, treated with IV antibiotics. Emphasized recurrence prevention, especially with metal heart valve.  Aortic valve replacement Replacement with previous infection risk due to bacteremia. Continued vigilance required.  Acute kidney injury (resolved) Previous creatinine spike resolved. Emphasized monitoring kidney function, especially with amoxicillin  use. - Order labs to check kidney function.

## 2024-02-29 ENCOUNTER — Encounter: Payer: Self-pay | Admitting: Infectious Disease

## 2024-02-29 ENCOUNTER — Other Ambulatory Visit: Payer: Self-pay

## 2024-02-29 ENCOUNTER — Ambulatory Visit: Admitting: Infectious Disease

## 2024-02-29 VITALS — BP 134/76 | HR 72 | Temp 97.7°F | Ht 67.0 in | Wt 178.0 lb

## 2024-02-29 DIAGNOSIS — Z7901 Long term (current) use of anticoagulants: Secondary | ICD-10-CM

## 2024-02-29 DIAGNOSIS — A498 Other bacterial infections of unspecified site: Secondary | ICD-10-CM

## 2024-02-29 DIAGNOSIS — Z96651 Presence of right artificial knee joint: Secondary | ICD-10-CM | POA: Diagnosis not present

## 2024-02-29 DIAGNOSIS — I350 Nonrheumatic aortic (valve) stenosis: Secondary | ICD-10-CM

## 2024-02-29 DIAGNOSIS — Z96659 Presence of unspecified artificial knee joint: Secondary | ICD-10-CM

## 2024-02-29 DIAGNOSIS — T8453XD Infection and inflammatory reaction due to internal right knee prosthesis, subsequent encounter: Secondary | ICD-10-CM | POA: Diagnosis not present

## 2024-02-29 DIAGNOSIS — B952 Enterococcus as the cause of diseases classified elsewhere: Secondary | ICD-10-CM | POA: Diagnosis not present

## 2024-02-29 DIAGNOSIS — T847XXD Infection and inflammatory reaction due to other internal orthopedic prosthetic devices, implants and grafts, subsequent encounter: Secondary | ICD-10-CM

## 2024-02-29 DIAGNOSIS — T8450XD Infection and inflammatory reaction due to unspecified internal joint prosthesis, subsequent encounter: Secondary | ICD-10-CM

## 2024-02-29 DIAGNOSIS — R7881 Bacteremia: Secondary | ICD-10-CM

## 2024-02-29 DIAGNOSIS — N179 Acute kidney failure, unspecified: Secondary | ICD-10-CM

## 2024-02-29 DIAGNOSIS — I330 Acute and subacute infective endocarditis: Secondary | ICD-10-CM

## 2024-03-01 ENCOUNTER — Ambulatory Visit: Payer: Self-pay

## 2024-03-01 ENCOUNTER — Ambulatory Visit

## 2024-03-01 DIAGNOSIS — Z7901 Long term (current) use of anticoagulants: Secondary | ICD-10-CM | POA: Diagnosis not present

## 2024-03-01 LAB — COMPLETE METABOLIC PANEL WITHOUT GFR
AG Ratio: 2.2 (calc) (ref 1.0–2.5)
ALT: 22 U/L (ref 9–46)
AST: 23 U/L (ref 10–35)
Albumin: 4.8 g/dL (ref 3.6–5.1)
Alkaline phosphatase (APISO): 44 U/L (ref 35–144)
BUN: 13 mg/dL (ref 7–25)
CO2: 24 mmol/L (ref 20–32)
Calcium: 9.5 mg/dL (ref 8.6–10.3)
Chloride: 100 mmol/L (ref 98–110)
Creat: 0.83 mg/dL (ref 0.70–1.35)
Globulin: 2.2 g/dL (ref 1.9–3.7)
Glucose, Bld: 323 mg/dL — ABNORMAL HIGH (ref 65–99)
Potassium: 4.6 mmol/L (ref 3.5–5.3)
Sodium: 135 mmol/L (ref 135–146)
Total Bilirubin: 0.6 mg/dL (ref 0.2–1.2)
Total Protein: 7 g/dL (ref 6.1–8.1)

## 2024-03-01 LAB — CBC WITH DIFFERENTIAL/PLATELET
Absolute Lymphocytes: 911 {cells}/uL (ref 850–3900)
Absolute Monocytes: 352 {cells}/uL (ref 200–950)
Basophils Absolute: 31 {cells}/uL (ref 0–200)
Basophils Relative: 0.7 %
Eosinophils Absolute: 101 {cells}/uL (ref 15–500)
Eosinophils Relative: 2.3 %
HCT: 37.8 % — ABNORMAL LOW (ref 38.5–50.0)
Hemoglobin: 13 g/dL — ABNORMAL LOW (ref 13.2–17.1)
MCH: 30.2 pg (ref 27.0–33.0)
MCHC: 34.4 g/dL (ref 32.0–36.0)
MCV: 87.7 fL (ref 80.0–100.0)
MPV: 11.5 fL (ref 7.5–12.5)
Monocytes Relative: 8 %
Neutro Abs: 3005 {cells}/uL (ref 1500–7800)
Neutrophils Relative %: 68.3 %
Platelets: 304 10*3/uL (ref 140–400)
RBC: 4.31 10*6/uL (ref 4.20–5.80)
RDW: 13 % (ref 11.0–15.0)
Total Lymphocyte: 20.7 %
WBC: 4.4 10*3/uL (ref 3.8–10.8)

## 2024-03-01 LAB — C-REACTIVE PROTEIN: CRP: 9.9 mg/L — ABNORMAL HIGH (ref <8.0)

## 2024-03-01 LAB — SEDIMENTATION RATE: Sed Rate: 22 mm/h — ABNORMAL HIGH (ref 0–20)

## 2024-03-01 LAB — POCT INR: INR: 3 (ref 2.0–3.0)

## 2024-03-01 NOTE — Patient Instructions (Addendum)
Pre visit review using our clinic review tool, if applicable. No additional management support is needed unless otherwise documented below in the visit note.  Continue 1 tablet daily . Recheck in 4 weeks.  

## 2024-03-01 NOTE — Progress Notes (Signed)
 Pt reported he has been taking 1 tablet daily since last visit. Since INR is at goal today pt will continue this dosing. Continue 1 tablet daily. Recheck in 4 weeks.

## 2024-03-12 ENCOUNTER — Encounter: Payer: Self-pay | Admitting: Infectious Disease

## 2024-03-13 ENCOUNTER — Telehealth: Admitting: Family Medicine

## 2024-03-13 ENCOUNTER — Encounter: Payer: Self-pay | Admitting: Family Medicine

## 2024-03-13 DIAGNOSIS — R41840 Attention and concentration deficit: Secondary | ICD-10-CM

## 2024-03-13 NOTE — Progress Notes (Signed)
 Patient ID: Dennis Bates, male   DOB: 1961-06-12, 63 y.o.   MRN: [id number]   Virtual Visit via Video Note  I connected with Dennis Bates on 03/13/24 at  9:30 AM EDT by a video enabled telemedicine application and verified that I am speaking with the correct person using two identifiers.  Location patient: home Location provider:work or home office Persons participating in the virtual visit: patient, provider  I discussed the limitations of evaluation and management by telemedicine and the availability of in person appointments. The patient expressed understanding and agreed to proceed.   HPI: Dennis set up this appointment to discuss concerns for possible adult ADD.  He states much of his life he has had difficulty focusing and completing tasks.  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  He has multiple cases and frequently has difficulty completing all the paperwork and completing assessments on time.  He has difficulty finishing tasks.  He states he currently frequently feels overwhelmed .  He has noted difficulty staying focused and on task at home as well.  They are trying to get their home ready to sell.  Easy distractibility.  He states he had some challenges in school as well.  Never assessed for ADD.  He specifically had questions regarding potential interventions.  He has tried to do some behavioral modification to help with his difficulty staying on task.  He does have cardiac history with history of CAD, bicuspid aortic valve with stenosis, history of prosthetic valve endocarditis, type 2 diabetes, osteoarthritis, dyslipidemia  Past Medical History:  Diagnosis Date   Aortic stenosis due to bicuspid aortic valve 09/2014   Severe aortic stenosis of bicuspid valve - s/pAVR with Bentall ( 09/2015)   Bell's palsy    LAST EPISODE 09-2015   CAD S/P percutaneous coronary angioplasty 09/20/2014   a. inferolat STEMI ->> LHC-Angio: Proximal/ostial LAD 20%, OM2 99% -->>  PCI: 3mm x 16 mm Promus Premier DES to the OM2.(~3.5 mm)   Diabetes mellitus type 2, controlled, with complications (HCC)    History of mechanical aortic valve replacement 10/09/2015   after 1 yr DAPT for Inferolateral STEMI. (Dr. 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   Shoulder arthritis 07/11/2020   Rt Severe GH DJD xray Sep 2021   ST elevation myocardial infarction (STEMI) of inferior wall (HCC) 09/20/2014   99 % occluded Cx-OM2 Promus DES 3.0 mm x 16 mm (3.5 mm)   Past Surgical History:  Procedure Laterality Date   BENTALL PROCEDURE N/A 10/09/2015   Procedure: BENTALL PROCEDURE (using a St Jude mechanical valve, size 23);  Surgeon: Dorise MARLA Lucas, MD;  Location: [facility] OR;  Service: Open Heart Surgery;  Laterality: N/A;  CIRC ARRESTNEEDS RIGHT RADIAL A-LINE   COLONOSCOPY WITH PROPOFOL  N/A 01/21/2020   Procedure: COLONOSCOPY WITH PROPOFOL ;  Surgeon: Charlanne Groom, MD;  Location: [facility] ENDOSCOPY;  Service: Endoscopy;  Laterality: N/A;   CORONARY ANGIOGRAPHY Left 09/01/2023   Procedure: CORONARY ANGIOGRAPHY;  Surgeon: Darron Deatrice LABOR, MD;  Location: [facility] INVASIVE CV LAB;  Service: Cardiovascular;  Laterality: Left;   ENTEROSCOPY N/A 01/24/2020   Procedure: ENTEROSCOPY;  Surgeon: Legrand Victory LITTIE DOUGLAS, MD;  Location: [facility] ENDOSCOPY;  Service: Gastroenterology;  Laterality: N/A;   ESOPHAGOGASTRODUODENOSCOPY (EGD) WITH PROPOFOL  N/A 01/21/2020   Procedure: ESOPHAGOGASTRODUODENOSCOPY (EGD) WITH PROPOFOL ;  Surgeon: Charlanne Groom, MD;  Location: [facility] ENDOSCOPY;  Service: Endoscopy;  Laterality: N/A;  GIVENS CAPSULE STUDY N/A 01/22/2020   Procedure: GIVENS CAPSULE STUDY;  Surgeon: Legrand Victory LITTIE DOUGLAS, MD;  Location: [facility] ENDOSCOPY;  Service: Gastroenterology;  Laterality: N/A;   IRRIGATION AND DEBRIDEMENT KNEE Right 01/24/2023   Procedure: IRRIGATION AND DEBRIDEMENT KNEE;  Surgeon: Yvone Rush, MD;  Location: [facility] OR;  Service:  Orthopedics;  Laterality: Right;   KNEE ARTHROSCOPY W/ ACL RECONSTRUCTION Right    90's; 2 ATHROSCOPIC , 2 REBUILDS   LEFT HEART CATHETERIZATION WITH CORONARY ANGIOGRAM N/A 09/20/2014   Procedure: LEFT HEART CATHETERIZATION WITH CORONARY ANGIOGRAM;  Surgeon: Alm LELON Clay, MD;  Location: [facility] CATH LAB;  Proximal/ostial LAD 20%, OM2 99% -->> aspiration thrombectomy and DES PCI:    PERCUTANEOUS CORONARY STENT INTERVENTION (PCI-S)  09/20/2014   Aspiration thrombectomy and DES PCI- OM 2(m-dCx): Promus Premier DES 3.0 mm x 16 mm (3.5 mm)   RIGHT/LEFT HEART CATH AND CORONARY ANGIOGRAPHY  09/2015   Dr. Debby Sor: Widely patent OM 2 stent.  Mild disease in RCA and LAD.Relatively normal right heart cath pressures.  Normal cardiac output.   TEE WITHOUT CARDIOVERSION N/A 10/09/2015   Procedure: TRANSESOPHAGEAL ECHOCARDIOGRAM (TEE);  Surgeon: Dorise MARLA Fellers, MD;  Location: [facility] OR;  Service: Open Heart Surgery;  Laterality: N/A;   TEE WITHOUT CARDIOVERSION N/A 01/21/2023   Procedure: TRANSESOPHAGEAL ECHOCARDIOGRAM;  Surgeon: Okey Vina GAILS, MD;  Location: [facility] INVASIVE CV LAB;  Service: Cardiovascular;  Laterality: N/A;   TOTAL KNEE ARTHROPLASTY Right 12/09/2017   Procedure: RIGHT TOTAL KNEE ARTHROPLASTY REMOVAL OF HARDWARE RIGHT KNEE;  Surgeon: Yvone Rush, MD;  Location: [facility] ORS;  Service: Orthopedics;  Laterality: Right;   TOTAL KNEE REVISION Right 01/24/2023   Procedure: TOTAL KNEE REVISION;  Surgeon: Yvone Rush, MD;  Location: [facility] OR;  Service: Orthopedics;  Laterality: Right;   TRANSTHORACIC ECHOCARDIOGRAM  02/2016   Post AVR: mechanical: AVR without obstuction. LVEF improved to 65-70%.   TRANSTHORACIC ECHOCARDIOGRAM  09/2014, 10/2014   Probable bicuspid aortic valve: a. mod-sev by 2D ECHO 09/2014. AVA 0.9cm^2; b) 10/2014 Echo:. Severe AS Mn-Pk Gradient 41-71 mmHg, AVA ~0.79 cm2, EF 60-65%   TRANSTHORACIC ECHOCARDIOGRAM  12/2019    Normal WM. Gr 2 DD. Mild LA dilation. 23 mm St. Jude Mechanical AoV well  seated.  Trivial AI.     reports that he has never smoked. He quit smokeless tobacco use about 38 years ago.  His smokeless tobacco use included chew. He reports current alcohol use. He reports that he does not use drugs. family history includes Heart disease in his mother. No Known Allergies    ROS: See pertinent positives and negatives per HPI.  Past Medical History:  Diagnosis Date   Aortic stenosis due to bicuspid aortic valve 09/2014   Severe aortic stenosis of bicuspid valve - s/pAVR with Bentall ( 09/2015)   Bell's palsy    LAST EPISODE 09-2015   CAD S/P percutaneous coronary angioplasty 09/20/2014   a. inferolat STEMI ->> LHC-Angio: Proximal/ostial LAD 20%, OM2 99% -->> PCI: 3mm x 16 mm Promus Premier DES to the OM2.(~3.5 mm)   Diabetes mellitus type 2, controlled, with complications (HCC)    History of mechanical aortic valve replacement 10/09/2015   after 1 yr DAPT for Inferolateral STEMI. (Dr. 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   Shoulder arthritis 07/11/2020   Rt Severe GH DJD xray Sep 2021   ST elevation myocardial infarction (STEMI) of inferior wall (HCC) 09/20/2014  99 % occluded Cx-OM2 Promus DES 3.0 mm x 16 mm (3.5 mm)    Past Surgical History:  Procedure Laterality Date   BENTALL PROCEDURE N/A 10/09/2015   Procedure: BENTALL PROCEDURE (using a St Jude mechanical valve, size 23);  Surgeon: Dorise MARLA Fellers, MD;  Location: [facility] OR;  Service: Open Heart Surgery;  Laterality: N/A;  CIRC ARRESTNEEDS RIGHT RADIAL A-LINE   COLONOSCOPY WITH PROPOFOL  N/A 01/21/2020   Procedure: COLONOSCOPY WITH PROPOFOL ;  Surgeon: Charlanne Groom, MD;  Location: [facility] ENDOSCOPY;  Service: Endoscopy;  Laterality: N/A;   CORONARY ANGIOGRAPHY Left 09/01/2023   Procedure: CORONARY ANGIOGRAPHY;  Surgeon: Darron Deatrice LABOR, MD;  Location: [facility] INVASIVE CV LAB;  Service: Cardiovascular;  Laterality: Left;   ENTEROSCOPY N/A  01/24/2020   Procedure: ENTEROSCOPY;  Surgeon: Legrand Victory LITTIE DOUGLAS, MD;  Location: [facility] ENDOSCOPY;  Service: Gastroenterology;  Laterality: N/A;   ESOPHAGOGASTRODUODENOSCOPY (EGD) WITH PROPOFOL  N/A 01/21/2020   Procedure: ESOPHAGOGASTRODUODENOSCOPY (EGD) WITH PROPOFOL ;  Surgeon: Charlanne Groom, MD;  Location: [facility] ENDOSCOPY;  Service: Endoscopy;  Laterality: N/A;   GIVENS CAPSULE STUDY N/A 01/22/2020   Procedure: GIVENS CAPSULE STUDY;  Surgeon: Legrand Victory LITTIE DOUGLAS, MD;  Location: [facility] ENDOSCOPY;  Service: Gastroenterology;  Laterality: N/A;   IRRIGATION AND DEBRIDEMENT KNEE Right 01/24/2023   Procedure: IRRIGATION AND DEBRIDEMENT KNEE;  Surgeon: Yvone Rush, MD;  Location: [facility] OR;  Service: Orthopedics;  Laterality: Right;   KNEE ARTHROSCOPY W/ ACL RECONSTRUCTION Right    90's; 2 ATHROSCOPIC , 2 REBUILDS   LEFT HEART CATHETERIZATION WITH CORONARY ANGIOGRAM N/A 09/20/2014   Procedure: LEFT HEART CATHETERIZATION WITH CORONARY ANGIOGRAM;  Surgeon: Alm LELON Clay, MD;  Location: [facility] CATH LAB;  Proximal/ostial LAD 20%, OM2 99% -->> aspiration thrombectomy and DES PCI:    PERCUTANEOUS CORONARY STENT INTERVENTION (PCI-S)  09/20/2014   Aspiration thrombectomy and DES PCI- OM 2(m-dCx): Promus Premier DES 3.0 mm x 16 mm (3.5 mm)   RIGHT/LEFT HEART CATH AND CORONARY ANGIOGRAPHY  09/2015   Dr. Debby Sor: Widely patent OM 2 stent.  Mild disease in RCA and LAD.Relatively normal right heart cath pressures.  Normal cardiac output.   TEE WITHOUT CARDIOVERSION N/A 10/09/2015   Procedure: TRANSESOPHAGEAL ECHOCARDIOGRAM (TEE);  Surgeon: Dorise MARLA Fellers, MD;  Location: [facility] OR;  Service: Open Heart Surgery;  Laterality: N/A;   TEE WITHOUT CARDIOVERSION N/A 01/21/2023   Procedure: TRANSESOPHAGEAL ECHOCARDIOGRAM;  Surgeon: Okey Vina GAILS, MD;  Location: [facility] INVASIVE CV LAB;  Service: Cardiovascular;  Laterality: N/A;   TOTAL KNEE ARTHROPLASTY Right 12/09/2017   Procedure: RIGHT TOTAL KNEE ARTHROPLASTY REMOVAL OF HARDWARE RIGHT  KNEE;  Surgeon: Yvone Rush, MD;  Location: [facility] ORS;  Service: Orthopedics;  Laterality: Right;   TOTAL KNEE REVISION Right 01/24/2023   Procedure: TOTAL KNEE REVISION;  Surgeon: Yvone Rush, MD;  Location: [facility] OR;  Service: Orthopedics;  Laterality: Right;   TRANSTHORACIC ECHOCARDIOGRAM  02/2016   Post AVR: mechanical: AVR without obstuction. LVEF improved to 65-70%.   TRANSTHORACIC ECHOCARDIOGRAM  09/2014, 10/2014   Probable bicuspid aortic valve: a. mod-sev by 2D ECHO 09/2014. AVA 0.9cm^2; b) 10/2014 Echo:. Severe AS Mn-Pk Gradient 41-71 mmHg, AVA ~0.79 cm2, EF 60-65%   TRANSTHORACIC ECHOCARDIOGRAM  12/2019    Normal WM. Gr 2 DD. Mild LA dilation. 23 mm St. Jude Mechanical AoV well seated.  Trivial AI.     Family History  Problem Relation Age of Onset   Heart disease Mother    CAD Neg Hx        per  wife    SOCIAL HX: Non-smoker   Current Outpatient Medications:    acetaminophen  (TYLENOL ) 325 MG tablet, Take 2 tablets (650 mg total) by mouth every 6 (six) hours as needed for mild pain. (Patient taking differently: Take 325 mg by mouth every 6 (six) hours as needed for mild pain (pain score 1-3).), Disp: , Rfl:    B Complex-C (SUPER B COMPLEX PO), Take 1 capsule by mouth daily., Disp: , Rfl:    carvedilol  (COREG ) 3.125 MG tablet, Take 1 tablet by mouth twice daily, Disp: 180 tablet, Rfl: 0   gemfibrozil  (LOPID ) 600 MG tablet, TAKE 1 TABLET BY MOUTH IN THE MORNING AND AT BEDTIME, Disp: 180 tablet, Rfl: 0   losartan  (COZAAR ) 25 MG tablet, Take 1 tablet (25 mg total) by mouth daily., Disp: 90 tablet, Rfl: 3   metFORMIN  (GLUCOPHAGE ) 1000 MG tablet, TAKE 1 TABLET BY MOUTH TWICE DAILY WITH MEALS, Disp: 180 tablet, Rfl: 0   Multiple Vitamin (MULTIVITAMIN WITH MINERALS) TABS tablet, Take 1 tablet by mouth daily., Disp: , Rfl:    rosuvastatin  (CRESTOR ) 20 MG tablet, TAKE 1 TABLET BY MOUTH ONCE DAILY AT BEDTIME, Disp: 90 tablet, Rfl: 0   ursodiol  (ACTIGALL ) 300 MG capsule, TAKE 1 CAPSULE BY MOUTH  TWICE A DAY, Disp: 180 capsule, Rfl: 1   warfarin (COUMADIN ) 10 MG tablet, TAKE 1 TABLET BY MOUTH DAILY EXCEPT TAKE  1 1/2  TABLETS ON MONDAYS AND FRIDAYS OR AS DIRECTED  BY ANTICOAGULATION CLINIC. PT NEEDS APT WITH PCP., Disp: 110 tablet, Rfl: 1  EXAM:  VITALS per patient if applicable:  GENERAL: alert, oriented, appears well and in no acute distress  HEENT: atraumatic, conjunttiva clear, no obvious abnormalities on inspection of external nose and ears  NECK: normal movements of the head and neck  LUNGS: on inspection no signs of respiratory distress, breathing rate appears normal, no obvious gross SOB, gasping or wheezing  CV: no obvious cyanosis  MS: moves all visible extremities without noticeable abnormality  PSYCH/NEURO: pleasant and cooperative, no obvious depression or anxiety, speech and thought processing grossly intact  ASSESSMENT AND PLAN:  Discussed the following assessment and plan:  Concern for possible adult ADD.  Patient had difficulty with focusing and completing task as well as easy distractibility in multiple settings including work and home life.  He states he has been this way much of his life.  Never tested.  We recommended as a first step setting up referral to psychologist who does ADD assessments.  He was given name and number of our behavioral health division.  After that assessment is complete if he is diagnosed with ADD we will have to discuss complexities of treating him in view of his history of CAD.  Would have to be very cautious with stimulant type medications     I discussed the assessment and treatment plan with the patient. The patient was provided an opportunity to ask questions and all were answered. The patient agreed with the plan and demonstrated an understanding of the instructions.   The patient was advised to call back or seek an in-person evaluation if the symptoms worsen or if the condition fails to improve as anticipated.     Wolm Scarlet, MD

## 2024-03-29 ENCOUNTER — Ambulatory Visit

## 2024-03-29 ENCOUNTER — Ambulatory Visit: Payer: Self-pay | Admitting: Family

## 2024-03-29 DIAGNOSIS — Q2381 Bicuspid aortic valve: Secondary | ICD-10-CM | POA: Diagnosis not present

## 2024-03-29 DIAGNOSIS — Z7901 Long term (current) use of anticoagulants: Secondary | ICD-10-CM | POA: Diagnosis not present

## 2024-03-29 DIAGNOSIS — I350 Nonrheumatic aortic (valve) stenosis: Secondary | ICD-10-CM | POA: Diagnosis not present

## 2024-03-29 LAB — POCT INR: INR: 6.1 — AB (ref 2.0–3.0)

## 2024-03-29 LAB — PROTIME-INR
INR: 6.4 ratio (ref 0.8–1.0)
Prothrombin Time: 62.6 s (ref 9.6–13.1)

## 2024-03-29 NOTE — Progress Notes (Cosign Needed Addendum)
 Received critical INR result from [place] at [facility] lab. Lab INR today is 6.4  Brought to attention of Ginger Patrick, NP, due to pt's PCP not in this office.   Updated pt after receiving lab INR. Advised to hold warfarin for the next 3 days and then take 1/2 tablet on Sunday and recheck INR on Monday, 7/21. Hold warfarin on Monday until INR check. Pt verbalized understanding.

## 2024-03-29 NOTE — Progress Notes (Cosign Needed Addendum)
 Pt reports he was advised in June he could stop the Amoxicillin  for knee infection after knee surgery. Pt had recent swelling in knee and had apt with surgeon who restarted the Amoxicillin  and also drained the knee. Surgeon reported the infection in the knee has come back. Infection/inflammation will increase INR. Could be cause of supratherapeutic INR today. Pt denies any other changes.  Pt denies any bleeding. Had lab INR drawn STAT. Advised pt this nurse will f/u with him after result is received today. Hold all warfarin until contacted by coumadin  clinic this afternoon. Recheck INR on Monday at [place]. If you have any signs or symptoms of abnormal bruising or bleeding go to the ER.

## 2024-03-29 NOTE — Patient Instructions (Addendum)
 Pre visit review using our clinic review tool, if applicable. No additional management support is needed unless otherwise documented below in the visit note.  Hold all warfarin until contacted by coumadin  clinic this afternoon. Recheck on Monday at [place]. If you have any signs or symptoms of abnormal bruising or bleeding go to the ER.

## 2024-04-02 ENCOUNTER — Ambulatory Visit

## 2024-04-02 DIAGNOSIS — Z7901 Long term (current) use of anticoagulants: Secondary | ICD-10-CM | POA: Diagnosis not present

## 2024-04-02 LAB — POCT INR: INR: 1.3 — AB (ref 2.0–3.0)

## 2024-04-02 NOTE — Patient Instructions (Addendum)
 Pre visit review using our clinic review tool, if applicable. No additional management support is needed unless otherwise documented below in the visit note.  Increase dose today and tomorrow to take 1 1/2 tablets and then continue 1 tablet daily and recheck on Friday, 7/25, at [facility].

## 2024-04-02 NOTE — Progress Notes (Signed)
 Pt reports he was advised in June he could stop the Amoxicillin  for knee infection after knee surgery. Pt had recent swelling in knee and had apt with surgeon who restarted the Amoxicillin  and also drained the knee. Surgeon reported the infection in the knee has come back. Infection/inflammation will increase INR. Could be cause of supratherapeutic INR on Friday, 7/18. Pt denies any other changes.  Pt denies any bleeding. Had lab INR drawn STAT on Friday and came back as 6.4 Held warfarin for 3 days and yesterday took 1/2 normal dose.  Pt back in today for INR check.  Increase dose today and tomorrow to take 1 1/2 tablets and then continue 1 tablet daily and recheck on Friday, 7/25, at [facility].  Advised if any s/s of abnormal bruising or bleeding go to ER. Pt verbalized understanding.

## 2024-04-06 ENCOUNTER — Ambulatory Visit

## 2024-04-06 DIAGNOSIS — Z7901 Long term (current) use of anticoagulants: Secondary | ICD-10-CM | POA: Diagnosis not present

## 2024-04-06 LAB — POCT INR: INR: 2.8 (ref 2.0–3.0)

## 2024-04-06 NOTE — Progress Notes (Signed)
 Pt reports he was advised in June he could stop the Amoxicillin  for knee infection after knee surgery. Pt had recent swelling in knee and had apt with surgeon who restarted the Amoxicillin  and also drained the knee. Surgeon reported the infection in the knee has come back. Infection/inflammation will increase INR. Could be cause of supratherapeutic INR on Friday, 7/18. Pt denies any other changes.  Pt denies any bleeding. Had lab INR drawn STAT on Friday and came back as 6.4. Came back on Monday, 7/21 and INR was 1.3 Pt back in today for INR check.  Pt reports increased knee swelling and pain again. Advised pt he needs to contact the surgeon that performed the surgery. He reported he has an apt in a couple of weeks. He reported he didn't want them to drain his knee again because it is painful. Observed pt grimacing in pain when having to get out of the chair. Advised pt the importance of contacting surgeon's office today. Advised if anything worsens or he develops a fever or any new symptoms to go to the ER. Advised if any s/s of abnormal bruising or bleeding to go to ER. Pt denies any bleeding currently. Pt verbalized understanding.  Continue 1 tablet daily and recheck on Friday, 8/1, at [facility].  Advised if any s/s of abnormal bruising or bleeding go to ER. Pt verbalized understanding.

## 2024-04-06 NOTE — Patient Instructions (Addendum)
 Pre visit review using our clinic review tool, if applicable. No additional management support is needed unless otherwise documented below in the visit note.  Continue 1 tablet daily and recheck on Friday, 8/1, at [facility].

## 2024-04-13 ENCOUNTER — Ambulatory Visit

## 2024-04-13 DIAGNOSIS — Z7901 Long term (current) use of anticoagulants: Secondary | ICD-10-CM

## 2024-04-13 LAB — POCT INR: INR: 3.7 — AB (ref 2.0–3.0)

## 2024-04-13 NOTE — Progress Notes (Cosign Needed Addendum)
 Pt reports he was advised in June he could stop the Amoxicillin  for knee infection after knee surgery. Pt had recent swelling in knee and had apt with surgeon who restarted the Amoxicillin  and also drained the knee. Surgeon reported the infection in the knee has come back. Infection/inflammation will increase INR. Could be cause of supratherapeutic INR on Friday, 7/18. Pt denies any other changes.  Pt denies any s/s of abnormal bruising or bleeding. Reduce dose today to take 1/2 tablet and then continue 1 tablet daily and recheck on Friday, 8/14, at [facility].  Advised if any s/s of abnormal bruising or bleeding go to ER. Pt verbalized understanding.    Medical screening examination/treatment/procedure(s) were performed by non-physician practitioner and as supervising physician I was immediately available for consultation/collaboration.  I agree with above. Karlynn Noel, MD

## 2024-04-13 NOTE — Patient Instructions (Addendum)
 Pre visit review using our clinic review tool, if applicable. No additional management support is needed unless otherwise documented below in the visit note.  Reduce dose today to take 1/2 tablet and then continue 1 tablet daily and recheck on Friday, 8/14, at [facility].

## 2024-04-23 ENCOUNTER — Telehealth: Payer: Self-pay

## 2024-04-23 DIAGNOSIS — Z7901 Long term (current) use of anticoagulants: Secondary | ICD-10-CM

## 2024-04-23 MED ORDER — WARFARIN SODIUM 10 MG PO TABS: ORAL_TABLET | ORAL | 1 refills | Status: DC

## 2024-04-23 NOTE — Telephone Encounter (Signed)
 Copied from CRM #[id number]. Topic: Clinical - Request for Lab/Test Order >> Apr 23, 2024  1:21 PM Dennis Bates wrote: Reason for CRM: Patient requesting lipid panel labs for pre-op for surgery. Patient is requesting to be seen 8/14.

## 2024-04-23 NOTE — Telephone Encounter (Signed)
 Received msg that pharmacy requested refill of warfarin.  Pt is compliant with warfarin management and PCP apts.  Sent in refill of warfarin to requested pharmacy.

## 2024-04-25 ENCOUNTER — Encounter: Payer: Self-pay | Admitting: Family Medicine

## 2024-04-25 ENCOUNTER — Ambulatory Visit: Admitting: Family Medicine

## 2024-04-25 ENCOUNTER — Ambulatory Visit: Payer: Self-pay | Admitting: Family Medicine

## 2024-04-25 ENCOUNTER — Telehealth: Payer: Self-pay | Admitting: *Deleted

## 2024-04-25 VITALS — BP 126/70 | HR 75 | Temp 97.6°F | Wt 170.5 lb

## 2024-04-25 DIAGNOSIS — I10 Essential (primary) hypertension: Secondary | ICD-10-CM

## 2024-04-25 DIAGNOSIS — E1169 Type 2 diabetes mellitus with other specified complication: Secondary | ICD-10-CM | POA: Diagnosis not present

## 2024-04-25 DIAGNOSIS — E1165 Type 2 diabetes mellitus with hyperglycemia: Secondary | ICD-10-CM | POA: Diagnosis not present

## 2024-04-25 DIAGNOSIS — Z01818 Encounter for other preprocedural examination: Secondary | ICD-10-CM | POA: Diagnosis not present

## 2024-04-25 DIAGNOSIS — E785 Hyperlipidemia, unspecified: Secondary | ICD-10-CM

## 2024-04-25 DIAGNOSIS — Z7984 Long term (current) use of oral hypoglycemic drugs: Secondary | ICD-10-CM

## 2024-04-25 LAB — CBC WITH DIFFERENTIAL/PLATELET
Basophils Absolute: 0 K/uL (ref 0.0–0.1)
Basophils Relative: 1 % (ref 0.0–3.0)
Eosinophils Absolute: 0.1 K/uL (ref 0.0–0.7)
Eosinophils Relative: 2.5 % (ref 0.0–5.0)
HCT: 34.7 % — ABNORMAL LOW (ref 39.0–52.0)
Hemoglobin: 11.7 g/dL — ABNORMAL LOW (ref 13.0–17.0)
Lymphocytes Relative: 17 % (ref 12.0–46.0)
Lymphs Abs: 0.8 K/uL (ref 0.7–4.0)
MCHC: 33.6 g/dL (ref 30.0–36.0)
MCV: 83.5 fl (ref 78.0–100.0)
Monocytes Absolute: 0.4 K/uL (ref 0.1–1.0)
Monocytes Relative: 7.6 % (ref 3.0–12.0)
Neutro Abs: 3.3 K/uL (ref 1.4–7.7)
Neutrophils Relative %: 71.9 % (ref 43.0–77.0)
Platelets: 437 K/uL — ABNORMAL HIGH (ref 150.0–400.0)
RBC: 4.16 Mil/uL — ABNORMAL LOW (ref 4.22–5.81)
RDW: 14 % (ref 11.5–15.5)
WBC: 4.6 K/uL (ref 4.0–10.5)

## 2024-04-25 LAB — COMPREHENSIVE METABOLIC PANEL WITH GFR
ALT: 13 U/L (ref 0–53)
AST: 16 U/L (ref 0–37)
Albumin: 4.6 g/dL (ref 3.5–5.2)
Alkaline Phosphatase: 61 U/L (ref 39–117)
BUN: 15 mg/dL (ref 6–23)
CO2: 23 meq/L (ref 19–32)
Calcium: 10.1 mg/dL (ref 8.4–10.5)
Chloride: 102 meq/L (ref 96–112)
Creatinine, Ser: 0.8 mg/dL (ref 0.40–1.50)
GFR: 94.82 mL/min (ref >60.00)
Glucose, Bld: 202 mg/dL — ABNORMAL HIGH (ref 70–99)
Potassium: 4.6 meq/L (ref 3.5–5.1)
Sodium: 135 meq/L (ref 135–145)
Total Bilirubin: 0.4 mg/dL (ref 0.2–1.2)
Total Protein: 7.9 g/dL (ref 6.0–8.3)

## 2024-04-25 LAB — HEMOGLOBIN A1C: Hgb A1c MFr Bld: 10 % — ABNORMAL HIGH (ref 4.6–6.5)

## 2024-04-25 NOTE — Telephone Encounter (Signed)
 LVM for pt to return call

## 2024-04-25 NOTE — Telephone Encounter (Signed)
 Copied from CRM [id number]. Topic: Clinical - Medication Question >> Apr 25, 2024  2:41 PM Dedra B wrote: Reason for CRM: Pt would like a call from [facility] regarding his Warfarin. He has surgery scheduled on 9/16 and would like to know how it should be adjusted.

## 2024-04-25 NOTE — Progress Notes (Signed)
 Established Patient Office Visit  Subjective   Patient ID: Dennis Bates, male    DOB: 1961/09/08  Age: 63 y.o. MRN: [id number]  Chief Complaint  Patient presents with   Pre-op Exam    HPI   Dennis Bates is seen for preoperative clearance for revision right total knee arthroplasty down in [place] sometime in September.  He has history of knee replacement several years ago and has had complication of secondary infection.  Is followed by infectious disease.  Currently on amoxicillin .  Local orthopedist had referred to specialty clinic in [place] that has expertise with surgery on infected joints.  Dennis Bates has past medical history significant for aortic stenosis secondary to bicuspid valve, history of CAD with prior STEMI inferior lateral wall and prior stenting circumflex vessel.  He has history of aortic valve replacement.  Medical problems include hyperlipidemia, type 2 diabetes, osteoarthritis involving multiple joints, dyslipidemia, chronic anticoagulation with Coumadin   Denies any recent chest pains.  He had cardiac cath last December after presenting with some atypical chest pain.  This showed widely patent left circumflex stent and some scattered 20 to 30% nonobstructive lesions.  He has pending follow-up with cardiology for cardiac clearance prior to his surgery.  Has never smoked.  No chronic lung issues.  Diabetes with last A1c 7.6%.  Never went on Jardiance  secondary to cost issues.  Does remain on metformin .  Past Medical History:  Diagnosis Date   Aortic stenosis due to bicuspid aortic valve 09/2014   Severe aortic stenosis of bicuspid valve - s/pAVR with Bentall ( 09/2015)   Bell's palsy    LAST EPISODE 09-2015   CAD S/P percutaneous coronary angioplasty 09/20/2014   a. inferolat STEMI ->> LHC-Angio: Proximal/ostial LAD 20%, OM2 99% -->> PCI: 3mm x 16 mm Promus Premier DES to the OM2.(~3.5 mm)   Diabetes mellitus type 2, controlled, with complications (HCC)    History of  mechanical aortic valve replacement 10/09/2015   after 1 yr DAPT for Inferolateral STEMI. (Dr. 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   Shoulder arthritis 07/11/2020   Rt Severe GH DJD xray Sep 2021   ST elevation myocardial infarction (STEMI) of inferior wall (HCC) 09/20/2014   99 % occluded Cx-OM2 Promus DES 3.0 mm x 16 mm (3.5 mm)   Past Surgical History:  Procedure Laterality Date   BENTALL PROCEDURE N/A 10/09/2015   Procedure: BENTALL PROCEDURE (using a St Jude mechanical valve, size 23);  Surgeon: Dorise MARLA Lucas, MD;  Location: [facility] OR;  Service: Open Heart Surgery;  Laterality: N/A;  CIRC ARRESTNEEDS RIGHT RADIAL A-LINE   COLONOSCOPY WITH PROPOFOL  N/A 01/21/2020   Procedure: COLONOSCOPY WITH PROPOFOL ;  Surgeon: Charlanne Groom, MD;  Location: [facility] ENDOSCOPY;  Service: Endoscopy;  Laterality: N/A;   CORONARY ANGIOGRAPHY Left 09/01/2023   Procedure: CORONARY ANGIOGRAPHY;  Surgeon: Darron Deatrice LABOR, MD;  Location: [facility] INVASIVE CV LAB;  Service: Cardiovascular;  Laterality: Left;   ENTEROSCOPY N/A 01/24/2020   Procedure: ENTEROSCOPY;  Surgeon: Legrand Victory LITTIE DOUGLAS, MD;  Location: [facility] ENDOSCOPY;  Service: Gastroenterology;  Laterality: N/A;   ESOPHAGOGASTRODUODENOSCOPY (EGD) WITH PROPOFOL  N/A 01/21/2020   Procedure: ESOPHAGOGASTRODUODENOSCOPY (EGD) WITH PROPOFOL ;  Surgeon: Charlanne Groom, MD;  Location: [facility] ENDOSCOPY;  Service: Endoscopy;  Laterality: N/A;   GIVENS CAPSULE STUDY N/A 01/22/2020   Procedure: GIVENS CAPSULE STUDY;  Surgeon: Legrand Victory LITTIE DOUGLAS, MD;  Location[facility] ENDOSCOPY;  Service: Gastroenterology;  Laterality: N/A;  IRRIGATION AND DEBRIDEMENT KNEE Right 01/24/2023   Procedure: IRRIGATION AND DEBRIDEMENT KNEE;  Surgeon: Yvone Rush, MD;  Location: [facility] OR;  Service: Orthopedics;  Laterality: Right;   KNEE ARTHROSCOPY W/ ACL RECONSTRUCTION Right    90's; 2 ATHROSCOPIC , 2 REBUILDS   LEFT HEART  CATHETERIZATION WITH CORONARY ANGIOGRAM N/A 09/20/2014   Procedure: LEFT HEART CATHETERIZATION WITH CORONARY ANGIOGRAM;  Surgeon: Alm LELON Clay, MD;  Location: [facility] CATH LAB;  Proximal/ostial LAD 20%, OM2 99% -->> aspiration thrombectomy and DES PCI:    PERCUTANEOUS CORONARY STENT INTERVENTION (PCI-S)  09/20/2014   Aspiration thrombectomy and DES PCI- OM 2(m-dCx): Promus Premier DES 3.0 mm x 16 mm (3.5 mm)   RIGHT/LEFT HEART CATH AND CORONARY ANGIOGRAPHY  09/2015   Dr. Debby Sor: Widely patent OM 2 stent.  Mild disease in RCA and LAD.Relatively normal right heart cath pressures.  Normal cardiac output.   TEE WITHOUT CARDIOVERSION N/A 10/09/2015   Procedure: TRANSESOPHAGEAL ECHOCARDIOGRAM (TEE);  Surgeon: Dorise MARLA Fellers, MD;  Location: [facility] OR;  Service: Open Heart Surgery;  Laterality: N/A;   TEE WITHOUT CARDIOVERSION N/A 01/21/2023   Procedure: TRANSESOPHAGEAL ECHOCARDIOGRAM;  Surgeon: Okey Vina GAILS, MD;  Location: [facility] INVASIVE CV LAB;  Service: Cardiovascular;  Laterality: N/A;   TOTAL KNEE ARTHROPLASTY Right 12/09/2017   Procedure: RIGHT TOTAL KNEE ARTHROPLASTY REMOVAL OF HARDWARE RIGHT KNEE;  Surgeon: Yvone Rush, MD;  Location: [facility] ORS;  Service: Orthopedics;  Laterality: Right;   TOTAL KNEE REVISION Right 01/24/2023   Procedure: TOTAL KNEE REVISION;  Surgeon: Yvone Rush, MD;  Location: [facility] OR;  Service: Orthopedics;  Laterality: Right;   TRANSTHORACIC ECHOCARDIOGRAM  02/2016   Post AVR: mechanical: AVR without obstuction. LVEF improved to 65-70%.   TRANSTHORACIC ECHOCARDIOGRAM  09/2014, 10/2014   Probable bicuspid aortic valve: a. mod-sev by 2D ECHO 09/2014. AVA 0.9cm^2; b) 10/2014 Echo:. Severe AS Mn-Pk Gradient 41-71 mmHg, AVA ~0.79 cm2, EF 60-65%   TRANSTHORACIC ECHOCARDIOGRAM  12/2019    Normal WM. Gr 2 DD. Mild LA dilation. 23 mm St. Jude Mechanical AoV well seated.  Trivial AI.     reports that he has never smoked. He quit smokeless tobacco use about 38 years ago.  His smokeless tobacco  use included chew. He reports current alcohol use. He reports that he does not use drugs. family history includes Heart disease in his mother. No Known Allergies   Review of Systems  Constitutional:  Negative for chills, fever and malaise/fatigue.  Eyes:  Negative for blurred vision.  Respiratory:  Negative for shortness of breath.   Cardiovascular:  Negative for chest pain.  Neurological:  Negative for dizziness, weakness and headaches.      Objective:     BP 126/70   Pulse 75   Temp 97.6 F (36.4 C) (Oral)   Wt 170 lb 8 oz (77.3 kg)   SpO2 97%   BMI 26.70 kg/m  BP Readings from Last 3 Encounters:  04/25/24 126/70  02/29/24 134/76  11/08/23 106/68   Wt Readings from Last 3 Encounters:  04/25/24 170 lb 8 oz (77.3 kg)  02/29/24 178 lb (80.7 kg)  11/08/23 178 lb 12.8 oz (81.1 kg)      Physical Exam Vitals reviewed.  Constitutional:      General: He is not in acute distress.    Appearance: He is well-developed. He is not ill-appearing.  Eyes:     Pupils: Pupils are equal, round, and reactive to light.  Neck:     Thyroid : No thyromegaly.  Comments: No carotid bruits Cardiovascular:     Rate and Rhythm: Normal rate and regular rhythm.  Pulmonary:     Effort: Pulmonary effort is normal. No respiratory distress.     Breath sounds: Normal breath sounds. No wheezing or rales.  Musculoskeletal:     Cervical back: Neck supple.  Neurological:     Mental Status: He is alert and oriented to person, place, and time.      No results found for any visits on 04/25/24.  Last CBC Lab Results  Component Value Date   WBC 4.6 04/25/2024   HGB 11.7 (L) 04/25/2024   HCT 34.7 (L) 04/25/2024   MCV 83.5 04/25/2024   MCH 30.2 02/29/2024   RDW 14.0 04/25/2024   PLT 437.0 (H) 04/25/2024   Last metabolic panel Lab Results  Component Value Date   GLUCOSE 202 (H) 04/25/2024   NA 135 04/25/2024   K 4.6 04/25/2024   CL 102 04/25/2024   CO2 23 04/25/2024   BUN 15  04/25/2024   CREATININE 0.80 04/25/2024   GFR 94.82 04/25/2024   CALCIUM  10.1 04/25/2024   PROT 7.9 04/25/2024   ALBUMIN  4.6 04/25/2024   LABGLOB 2.2 08/28/2020   AGRATIO 2.3 (H) 08/28/2020   BILITOT 0.4 04/25/2024   ALKPHOS 61 04/25/2024   AST 16 04/25/2024   ALT 13 04/25/2024   ANIONGAP 15 02/28/2023   Last lipids Lab Results  Component Value Date   CHOL 133 08/24/2023   HDL 32.90 (L) 08/24/2023   LDLCALC 43 08/24/2023   LDLDIRECT 87.0 12/14/2021   TRIG 287.0 (H) 08/24/2023   CHOLHDL 4 08/24/2023   Last hemoglobin A1c Lab Results  Component Value Date   HGBA1C 10.0 (H) 04/25/2024   Last thyroid  functions Lab Results  Component Value Date   TSH 3.920 08/28/2020      The ASCVD Risk score (Arnett DK, et al., 2019) failed to calculate for the following reasons:   Risk score cannot be calculated because patient has a medical history suggesting prior/existing ASCVD    Assessment & Plan:   Problem List Items Addressed This Visit       Unprioritized   Essential hypertension - Primary (Chronic)   Hyperlipidemia associated with type 2 diabetes mellitus (HCC) (Chronic)   Relevant Orders   CMP   Type 2 diabetes mellitus with hyperglycemia (HCC)   Relevant Orders   Hemoglobin A1c   Other Visit Diagnoses       Preoperative examination       Relevant Orders   CBC with Differential/Platelet   APTT     Dennis Bates is here for preoperative medical clearance for revision right total knee arthroplasty.  Does have chronic medical problems as above including type 2 diabetes and needs follow-up A1c.  No known medical contraindications for surgery at this time but lab work is still pending as below.  He does have history of known CAD but had cardiac cath 12/24 with patent left circumflex stent and nonobstructive CAD otherwise.  No recent chest pains.  We discussed the following  -Check labs with CBC, CMP, PTT, A1c - He gets INR tomorrow through our Coumadin  clinic and is aware that  he will need to be bridged with Lovenox  for at least 5 days prior to surgery - Will defer EKG to his upcoming cardiology clearance appointment - Will assess A1c and if above 8 we will need further therapy to get this in line before any knee surgery  No follow-ups on file.  Wolm Scarlet, MD

## 2024-04-25 NOTE — Telephone Encounter (Signed)
 Anticoagulation indication: mechanical aortic valve  Surgery; knee scheduled for 9/16  Actual Wt: 77.3 kg Ideal Wt: 66 kg (actual wt 117%> ideal; can use actual wt for CrCl calculation) Adjusted Wt: 70 kg  CrCl: 104.68 mL/min  Lovenox  dosing; 80 mg Q12H  Current warfarin dosing; 1 tablet (10 mg) daily  Lovenox  Bridge schedule; (pt will most likely spend some time in hospital after surgery, surgeon will take over dosing if pt is in hospital)  9/11: Take last dose of warfairn 9/12: NO warfarin, NO Lovenox  9/13: NO warfarin, inject Lovenox  every 12 hours 9/14: NO warfarin, inject Lovenox  every 12 hours 9/15: NO warfarin, inject Lovenox  in AM ONLY, BEFORE 7 AM  9/16: SURGERY; NO WARFARIN, NO LOVENOX   9/17: Take 1 1/2 tablets (15 mg) warfarin, inject Lovenox  every 12 hours 9/18: Take 1 1/2 tablets (15 mg) warfarin, inject Lovenox  every 12 hours 9/19: Take 1 1/2 tablets (15 mg) warfarin, inject Lovenox  every 12 hours 9/20: Take 1 1/2 tablets (15 mg) warfarin, inject Lovenox  every 12 hours 9/21: Take 1 tablet (10 mg) warfarin, inject Lovenox  every 12 hours 9/22: Take 1 tablet (10 mg) warfarin, inject Lovenox  every 12 hours 9/23: Recheck INR; NO WARFARIN AND NO LOVENOX  UNTIL AFTER RECHECK

## 2024-04-26 ENCOUNTER — Telehealth: Payer: Self-pay

## 2024-04-26 ENCOUNTER — Ambulatory Visit

## 2024-04-26 LAB — APTT: aPTT: 81.7 s — ABNORMAL HIGH (ref 25.4–36.8)

## 2024-04-26 MED ORDER — GLIPIZIDE 5 MG PO TABS: 5.0000 mg | ORAL_TABLET | Freq: Every day | ORAL | 0 refills | Status: DC

## 2024-04-26 NOTE — Telephone Encounter (Signed)
 Copied from CRM [id number]. Topic: Clinical - Medication Question >> Apr 25, 2024  4:53 PM Drema MATSU wrote: Reason for CRM: He is suppose to have a knee replacement done and it all depends on his A1C. Patient wants to know if he can be prescribed empagliflozin  (JARDIANCE ) 25 MG TABS tablet again.

## 2024-04-26 NOTE — Addendum Note (Signed)
 Addended by: METTA KRISTEN CROME on: 04/26/2024 08:47 AM   Modules accepted: Orders

## 2024-04-27 ENCOUNTER — Ambulatory Visit

## 2024-04-27 ENCOUNTER — Telehealth: Payer: Self-pay
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
## 2024-04-30 MED ORDER — EMPAGLIFLOZIN 10 MG PO TABS: 10.0000 mg | ORAL_TABLET | Freq: Every day | ORAL | 0 refills | Status: DC

## 2024-04-30 NOTE — Telephone Encounter (Signed)
 Patient informed of the message and rx sent. Patient reported he started on Glipizide  and will continue this as Jardiance  is too costly for him at this time.

## 2024-04-30 NOTE — Addendum Note (Signed)
 Addended by: METTA KRISTEN CROME on: 04/30/2024 08:38 AM   Modules accepted: Orders

## 2024-05-03 ENCOUNTER — Encounter: Payer: Self-pay | Admitting: Family Medicine

## 2024-05-03 ENCOUNTER — Ambulatory Visit

## 2024-05-03 DIAGNOSIS — Z7901 Long term (current) use of anticoagulants: Secondary | ICD-10-CM | POA: Diagnosis not present

## 2024-05-03 LAB — POCT INR: INR: 4.4 — AB (ref 2.0–3.0)

## 2024-05-03 NOTE — Progress Notes (Addendum)
 Pt is trying to schedule knee surgery in the future but last A1C was 10 so there has been a hold placed on the surgery. Pt is currently still taking Amoxicillin  per surgeon, for knee infection.  Hold dose today and then change weekly dose to take 1 tablet daily except take 1/2 tablet on Monday and Thursday and recheck 1 week.  Pt denies any s/s of abnormal bruising or bleeding. Advised if any s/s to go to ER. Advised to eat some greens today. Pt verbalized understanding.

## 2024-05-03 NOTE — Patient Instructions (Addendum)
 Pre visit review using our clinic review tool, if applicable. No additional management support is needed unless otherwise documented below in the visit note.  Hold dose today and then change weekly dose to take 1 tablet daily except take 1/2 tablet on Monday and Thursday and recheck 1 week.

## 2024-05-10 ENCOUNTER — Ambulatory Visit

## 2024-05-10 DIAGNOSIS — Z7901 Long term (current) use of anticoagulants: Secondary | ICD-10-CM

## 2024-05-10 LAB — POCT INR: INR: 2.8 (ref 2.0–3.0)

## 2024-05-10 NOTE — Patient Instructions (Addendum)
 Pre visit review using our clinic review tool, if applicable. No additional management support is needed unless otherwise documented below in the visit note.  Continue 1 tablet daily except take 1/2 tablet on Monday and Thursday and recheck 2 week.

## 2024-05-10 NOTE — Progress Notes (Signed)
 Continue 1 tablet daily except take 1/2 tablet on Monday and Thursday and recheck 2 week.

## 2024-05-21 ENCOUNTER — Other Ambulatory Visit: Payer: Self-pay | Admitting: Family Medicine

## 2024-05-24 ENCOUNTER — Ambulatory Visit

## 2024-05-24 DIAGNOSIS — Z7901 Long term (current) use of anticoagulants: Secondary | ICD-10-CM

## 2024-05-24 LAB — POCT INR: INR: 3.4 — AB (ref 2.0–3.0)

## 2024-05-24 NOTE — Patient Instructions (Addendum)
 Pre visit review using our clinic review tool, if applicable. No additional management support is needed unless otherwise documented below in the visit note.  Continue 1 tablet daily except take 1/2 tablet on Monday and Thursday and recheck 4 week.

## 2024-05-24 NOTE — Progress Notes (Signed)
 Pt is trying to schedule knee surgery in the future but last A1C was 10 so there has been a hold placed on the surgery. Pt is currently still taking Amoxicillin  per surgeon, for knee infection.  Continue 1 tablet daily except take 1/2 tablet on Monday and Thursday and recheck 4 week.

## 2024-06-06 ENCOUNTER — Other Ambulatory Visit: Payer: Self-pay | Admitting: Family Medicine

## 2024-06-20 ENCOUNTER — Other Ambulatory Visit: Payer: Self-pay | Admitting: Family Medicine

## 2024-06-21 ENCOUNTER — Ambulatory Visit

## 2024-06-21 DIAGNOSIS — Z7901 Long term (current) use of anticoagulants: Secondary | ICD-10-CM | POA: Diagnosis not present

## 2024-06-21 LAB — POCT INR: INR: 1.5 — AB (ref 2.0–3.0)

## 2024-06-21 NOTE — Patient Instructions (Addendum)
 Pre visit review using our clinic review tool, if applicable. No additional management support is needed unless otherwise documented below in the visit note.  Increase dose today to take 1 tablet and increase dose tomorrow to take 1 1/2 tablets and then change weekly dose to take 1 tablet daily except take 1/2 tablet on Monday. Recheck 2 week.

## 2024-06-21 NOTE — Progress Notes (Signed)
 Pt is trying to schedule knee surgery in the future but last A1C was 10 so there has been a hold placed on the surgery. Pt reports an increase in salads in the last week and he has also started drinking protein shakes. Advised change in proteins and vitamin K containing foods will cause labile INR results. Advised to keep both consistent in his diet. Pt verbalized understanding. Pt is currently still taking Amoxicillin  per surgeon, for knee infection.  Increase dose today to take 1 tablet and increase dose tomorrow to take 1 1/2 tablets and then change weekly dose to take 1 tablet daily except take 1/2 tablet on Monday. Recheck 2 week.

## 2024-07-05 ENCOUNTER — Ambulatory Visit

## 2024-07-05 DIAGNOSIS — Z7901 Long term (current) use of anticoagulants: Secondary | ICD-10-CM | POA: Diagnosis not present

## 2024-07-05 LAB — POCT INR: INR: 2.3 (ref 2.0–3.0)

## 2024-07-05 NOTE — Progress Notes (Signed)
 Pt is trying to schedule knee surgery in the future but last A1C was 10 so there has been a hold placed on the surgery. Pt is currently still taking Amoxicillin  per surgeon, for knee infection.  Increase dose today to take 1 1/2 tablets and then change weekly dose to take 1 tablet daily. Recheck 3 week.

## 2024-07-05 NOTE — Patient Instructions (Addendum)
 Pre visit review using our clinic review tool, if applicable. No additional management support is needed unless otherwise documented below in the visit note.  Increase dose today to take 1 1/2 tablets and then change weekly dose to take 1 tablet daily. Recheck 3 week.

## 2024-07-13 ENCOUNTER — Encounter: Payer: Self-pay | Admitting: Family Medicine

## 2024-07-13 ENCOUNTER — Ambulatory Visit: Admitting: Family Medicine

## 2024-07-13 VITALS — BP 122/80 | HR 75 | Temp 97.8°F | Ht 67.0 in | Wt 178.0 lb

## 2024-07-13 DIAGNOSIS — Z7984 Long term (current) use of oral hypoglycemic drugs: Secondary | ICD-10-CM

## 2024-07-13 DIAGNOSIS — E119 Type 2 diabetes mellitus without complications: Secondary | ICD-10-CM

## 2024-07-13 LAB — POCT GLYCOSYLATED HEMOGLOBIN (HGB A1C): Hemoglobin A1C: 7.6 % — AB (ref 4.0–5.6)

## 2024-07-13 MED ORDER — GLIPIZIDE 5 MG PO TABS: 5.0000 mg | ORAL_TABLET | Freq: Two times a day (BID) | ORAL | 3 refills | Status: AC

## 2024-07-13 NOTE — Patient Instructions (Signed)
 A1C is improved to 7.6%.    Increase Glipizide  to twice daily- but watch for any hypoglycemia symptoms.

## 2024-07-13 NOTE — Progress Notes (Signed)
 Established Patient Office Visit  Subjective   Patient ID: Dennis Bates, male    DOB: 03-May-1961  Age: 63 y.o. MRN: [id number]  Chief Complaint  Patient presents with   Medical Management of Chronic Issues   Diabetes    HPI   Sou seen for follow-up type 2 diabetes.  He has history of knee replacement and unfortunately has knee infection and is waiting to get re-do knee replacement.  He had A1c couple months ago 10.0.  He was already on Jardiance  and metformin  and we added glipizide  5 mg daily.  Not monitoring blood sugars regularly.  No significant polyuria or polydipsia.  No fever.  Past Medical History:  Diagnosis Date   Aortic stenosis due to bicuspid aortic valve 09/2014   Severe aortic stenosis of bicuspid valve - s/pAVR with Bentall ( 09/2015)   Bell's palsy    LAST EPISODE 09-2015   CAD S/P percutaneous coronary angioplasty 09/20/2014   a. inferolat STEMI ->> LHC-Angio: Proximal/ostial LAD 20%, OM2 99% -->> PCI: 3mm x 16 mm Promus Premier DES to the OM2.(~3.5 mm)   Diabetes mellitus type 2, controlled, with complications (HCC)    History of mechanical aortic valve replacement 10/09/2015   after 1 yr DAPT for Inferolateral STEMI. (Dr. Lucas)   Hyperlipidemia    Hypertension    Hypertriglyceridemia    Prosthetic joint infection 02/28/2023   Shoulder arthritis 07/11/2020   Rt Severe GH DJD xray Sep 2021   ST elevation myocardial infarction (STEMI) of inferior wall (HCC) 09/20/2014   99 % occluded Cx-OM2 Promus DES 3.0 mm x 16 mm (3.5 mm)   Past Surgical History:  Procedure Laterality Date   BENTALL PROCEDURE N/A 10/09/2015   Procedure: BENTALL PROCEDURE (using a St Jude mechanical valve, size 23);  Surgeon: Dorise MARLA Lucas, MD;  Location: [facility] OR;  Service: Open Heart Surgery;  Laterality: N/A;  CIRC ARRESTNEEDS RIGHT RADIAL A-LINE   COLONOSCOPY WITH PROPOFOL  N/A 01/21/2020   Procedure: COLONOSCOPY WITH PROPOFOL ;  Surgeon: Charlanne Groom, MD;  Location: [facility] ENDOSCOPY;   Service: Endoscopy;  Laterality: N/A;   CORONARY ANGIOGRAPHY Left 09/01/2023   Procedure: CORONARY ANGIOGRAPHY;  Surgeon: Darron Deatrice LABOR, MD;  Location: [facility] INVASIVE CV LAB;  Service: Cardiovascular;  Laterality: Left;   ENTEROSCOPY N/A 01/24/2020   Procedure: ENTEROSCOPY;  Surgeon: Legrand Victory LITTIE DOUGLAS, MD;  Location: [facility] ENDOSCOPY;  Service: Gastroenterology;  Laterality: N/A;   ESOPHAGOGASTRODUODENOSCOPY (EGD) WITH PROPOFOL  N/A 01/21/2020   Procedure: ESOPHAGOGASTRODUODENOSCOPY (EGD) WITH PROPOFOL ;  Surgeon: Charlanne Groom, MD;  Location: [facility] ENDOSCOPY;  Service: Endoscopy;  Laterality: N/A;   GIVENS CAPSULE STUDY N/A 01/22/2020   Procedure: GIVENS CAPSULE STUDY;  Surgeon: Legrand Victory LITTIE DOUGLAS, MD;  Location: [facility] ENDOSCOPY;  Service: Gastroenterology;  Laterality: N/A;   IRRIGATION AND DEBRIDEMENT KNEE Right 01/24/2023   Procedure: IRRIGATION AND DEBRIDEMENT KNEE;  Surgeon: Yvone Rush, MD;  Location: [facility] OR;  Service: Orthopedics;  Laterality: Right;   KNEE ARTHROSCOPY W/ ACL RECONSTRUCTION Right    90's; 2 ATHROSCOPIC , 2 REBUILDS   LEFT HEART CATHETERIZATION WITH CORONARY ANGIOGRAM N/A 09/20/2014   Procedure: LEFT HEART CATHETERIZATION WITH CORONARY ANGIOGRAM;  Surgeon: Alm LELON Clay, MD;  Location: [facility] CATH LAB;  Proximal/ostial LAD 20%, OM2 99% -->> aspiration thrombectomy and DES PCI:    PERCUTANEOUS CORONARY STENT INTERVENTION (PCI-S)  09/20/2014   Aspiration thrombectomy and DES PCI- OM 2(m-dCx): Promus Premier DES 3.0 mm x 16 mm (3.5 mm)   RIGHT/LEFT HEART CATH AND CORONARY ANGIOGRAPHY  09/2015   Dr. Debby Sor: Widely patent OM 2 stent.  Mild disease in RCA and LAD.Relatively normal right heart cath pressures.  Normal cardiac output.   TEE WITHOUT CARDIOVERSION N/A 10/09/2015   Procedure: TRANSESOPHAGEAL ECHOCARDIOGRAM (TEE);  Surgeon: Dorise MARLA Fellers, MD;  Location: [facility] OR;  Service: Open Heart Surgery;  Laterality: N/A;   TEE WITHOUT CARDIOVERSION N/A 01/21/2023   Procedure:  TRANSESOPHAGEAL ECHOCARDIOGRAM;  Surgeon: Okey Vina GAILS, MD;  Location: [facility] INVASIVE CV LAB;  Service: Cardiovascular;  Laterality: N/A;   TOTAL KNEE ARTHROPLASTY Right 12/09/2017   Procedure: RIGHT TOTAL KNEE ARTHROPLASTY REMOVAL OF HARDWARE RIGHT KNEE;  Surgeon: Yvone Rush, MD;  Location: [facility] ORS;  Service: Orthopedics;  Laterality: Right;   TOTAL KNEE REVISION Right 01/24/2023   Procedure: TOTAL KNEE REVISION;  Surgeon: Yvone Rush, MD;  Location: [facility] OR;  Service: Orthopedics;  Laterality: Right;   TRANSTHORACIC ECHOCARDIOGRAM  02/2016   Post AVR: mechanical: AVR without obstuction. LVEF improved to 65-70%.   TRANSTHORACIC ECHOCARDIOGRAM  09/2014, 10/2014   Probable bicuspid aortic valve: a. mod-sev by 2D ECHO 09/2014. AVA 0.9cm^2; b) 10/2014 Echo:. Severe AS Mn-Pk Gradient 41-71 mmHg, AVA ~0.79 cm2, EF 60-65%   TRANSTHORACIC ECHOCARDIOGRAM  12/2019    Normal WM. Gr 2 DD. Mild LA dilation. 23 mm St. Jude Mechanical AoV well seated.  Trivial AI.     reports that he has never smoked. He quit smokeless tobacco use about 38 years ago.  His smokeless tobacco use included chew. He reports current alcohol use. He reports that he does not use drugs. family history includes Heart disease in his mother. No Known Allergies  Review of Systems  Constitutional:  Negative for chills and fever.      Objective:     BP 122/80   Pulse 75   Temp 97.8 F (36.6 C) (Oral)   Ht 5' 7 (1.702 m)   Wt 178 lb (80.7 kg)   SpO2 98%   BMI 27.88 kg/m  BP Readings from Last 3 Encounters:  07/13/24 122/80  04/25/24 126/70  02/29/24 134/76   Wt Readings from Last 3 Encounters:  07/13/24 178 lb (80.7 kg)  04/25/24 170 lb 8 oz (77.3 kg)  02/29/24 178 lb (80.7 kg)      Physical Exam Vitals reviewed.  Cardiovascular:     Rate and Rhythm: Normal rate and regular rhythm.  Pulmonary:     Effort: Pulmonary effort is normal.     Breath sounds: Normal breath sounds.  Neurological:     Mental Status: He is  alert.      Results for orders placed or performed in visit on 07/13/24  POC HgB A1c  Result Value Ref Range   Hemoglobin A1C 7.6 (A) 4.0 - 5.6 %   HbA1c POC (<> result, manual entry)     HbA1c, POC (prediabetic range)     HbA1c, POC (controlled diabetic range)        The ASCVD Risk score (Arnett DK, et al., 2019) failed to calculate for the following reasons:   Risk score cannot be calculated because patient has a medical history suggesting prior/existing ASCVD    Assessment & Plan:   Problem List Items Addressed This Visit       Unprioritized   Diabetes mellitus type II, non insulin  dependent (HCC) - Primary (Chronic)   Relevant Medications   glipiZIDE  (GLUCOTROL ) 5 MG tablet   Other Relevant Orders   POC HgB A1c (Completed)  Diabetes improved with A1c 7.6  which is down from 10 recently.  We did discuss cautious increase in glipizide  to 5 mg twice daily and continue Jardiance  and metformin .  Would like to eventually see his A1c down around 6.5% or lower.  He will contact his surgeon at this point since A1c improved to see if they can get surgery scheduled.  We have suggested follow-up here in 3 to 4 months to reassess  Return in about 3 months (around 10/13/2024).    Wolm Scarlet, MD

## 2024-07-26 ENCOUNTER — Other Ambulatory Visit: Payer: Self-pay | Admitting: Family Medicine

## 2024-07-26 ENCOUNTER — Ambulatory Visit

## 2024-07-26 DIAGNOSIS — Q2381 Bicuspid aortic valve: Secondary | ICD-10-CM

## 2024-07-26 DIAGNOSIS — K8020 Calculus of gallbladder without cholecystitis without obstruction: Secondary | ICD-10-CM

## 2024-07-26 DIAGNOSIS — Z7901 Long term (current) use of anticoagulants: Secondary | ICD-10-CM | POA: Diagnosis not present

## 2024-07-26 LAB — POCT INR: INR: 6.9 — AB (ref 2.0–3.0)

## 2024-07-26 NOTE — Patient Instructions (Addendum)
 Pre visit review using our clinic review tool, if applicable. No additional management support is needed unless otherwise documented below in the visit note.  Hold warfarin until Sunday, 11/16. Take 1/2 tablet on Sunday and 1 tablet on Monday. Recheck on 11/18 at [facility]

## 2024-07-26 NOTE — Progress Notes (Signed)
 A1C dropped to 7.6 so knee surgery has been rescheduled for Feb. Pt will need a Lovenox  bridge.  Pt reports increase in alcohol for one day two weeks ago but no alcohol since then. Pt reports increased mental stress. This has been seen in clinic to increase INR. Pt has not been eating any foods which will increase INR. Pt reports an increase in glipizide  dose to twice daily about 2 weeks ago. This could interact but normally causes decreased INR. Pt also reports he is not drinking the protein drinks he has been drinking and also not eating as much vitamin K containing foods. Advised pt again protein and vitamin K containing foods need to be consistent in his diet. Decreased intake in these will increase INR.  Pt denies any s/s of bleeding or abnormal bruising. Advised if any s/s to go to ER. Pt verbalized understanding. Hold warfarin until Sunday, 11/16. Take 1/2 tablet on Sunday and 1 tablet on Monday. Recheck on 11/18 at [facility].  INR lab draw was completed. This was not placed as stat. Advised pt this nurse will f/u with him tomorrow when result is received.

## 2024-07-27 ENCOUNTER — Telehealth: Payer: Self-pay | Admitting: *Deleted

## 2024-07-27 ENCOUNTER — Ambulatory Visit: Payer: Self-pay | Admitting: Family Medicine

## 2024-07-27 LAB — PROTIME-INR
INR: 7.1 ratio (ref 0.8–1.0)
Prothrombin Time: 69 s — ABNORMAL HIGH (ref 9.6–13.1)

## 2024-07-27 NOTE — Telephone Encounter (Signed)
 Dennis Bates with [place] [facility] called with Critical labs  Protime 69.0 INR 7.13  Results sent to Clotilda, RN and also sent her a message to let her know

## 2024-07-27 NOTE — Telephone Encounter (Signed)
 LVM for pt with lab INR result and to follow instructions given in coumadin  clinic yesterday.  Advised if any s/s of abnormal bruising or bleeding to go to ER as instructed yesterday.

## 2024-07-29 NOTE — Telephone Encounter (Signed)
 Thanks- noted  Wolm LELON Scarlet MD [facility] at [facility]

## 2024-07-31 ENCOUNTER — Ambulatory Visit

## 2024-07-31 DIAGNOSIS — Z7901 Long term (current) use of anticoagulants: Secondary | ICD-10-CM

## 2024-07-31 LAB — POCT INR: INR: 1.2 — AB (ref 2.0–3.0)

## 2024-07-31 NOTE — Patient Instructions (Addendum)
 Pre visit review using our clinic review tool, if applicable. No additional management support is needed unless otherwise documented below in the visit note.  Increase dose today and tomorrow to take 1 1/2 tablets and change weekly dose to take 1 tablet daily except take 1/2 tablet on Monday. Recheck in 2 weeks at [facility].

## 2024-07-31 NOTE — Progress Notes (Signed)
 Knee surgery has been rescheduled for Feb. Pt will need a Lovenox  bridge.  Note from 11/13: Pt reports increase in alcohol for one day two weeks ago but no alcohol since then. Pt reports increased mental stress. This has been seen in clinic to increase INR. Pt has not been eating any foods which will increase INR. Pt reports an increase in glipizide  dose to twice daily about 2 weeks ago. This could interact but normally causes decreased INR. Pt also reports he is not drinking the protein drinks he has been drinking and also not eating as much vitamin K containing foods. Advised pt again protein and vitamin K containing foods need to be consistent in his diet. Decreased intake in these will increase INR.  Due to these multiple factors which could have caused the supratherapeutic INR at the last apt weekly dosing will be reduced slightly due to pt reporting he is still under a lot of stress. Pt is not able to make apt for next week due to holiday so apt has been made for 2 weeks per pt request. Pt has been extensively educated on interactions that cause increases in INR and what to do if any s/s of bleeding or abnormal bruising.  Pt denies any s/s of bleeding or abnormal bruising. Advised if any s/s to go to ER. Pt verbalized understanding. Increase dose today and tomorrow to take 1 1/2 tablets and change weekly dose to take 1 tablet daily except take 1/2 tablet on Monday. Recheck in 2 weeks per pt request due to the holiday.

## 2024-08-16 ENCOUNTER — Ambulatory Visit

## 2024-08-16 DIAGNOSIS — Z7901 Long term (current) use of anticoagulants: Secondary | ICD-10-CM

## 2024-08-16 LAB — POCT INR: INR: 4 — AB (ref 2.0–3.0)

## 2024-08-16 NOTE — Progress Notes (Signed)
 Knee surgery has been rescheduled for Feb. Pt will need a Lovenox  bridge.  Hold warfarin today and then change weekly dose to take 1 tablet daily except take 1/2 tablet on Monday and Thursday. Recheck in 2 weeks.

## 2024-08-16 NOTE — Patient Instructions (Addendum)
 Pre visit review using our clinic review tool, if applicable. No additional management support is needed unless otherwise documented below in the visit note.  Hold warfarin today and then change weekly dose to take 1 tablet daily except take 1/2 tablet on Monday and Thursday. Recheck in 2 weeks.

## 2024-08-20 ENCOUNTER — Other Ambulatory Visit: Payer: Self-pay | Admitting: Medical

## 2024-08-20 ENCOUNTER — Other Ambulatory Visit: Payer: Self-pay | Admitting: Family Medicine

## 2024-08-29 ENCOUNTER — Telehealth: Payer: Self-pay

## 2024-08-29 NOTE — Telephone Encounter (Signed)
 Copied from CRM [id number]. Topic: Appointments - Appointment Info/Confirmation >> Aug 29, 2024  2:20 PM Donna E wrote: Patient/patient representative is calling for information regarding an appointment.  Patient calling wanting to reschedule appt on 08/30/24 at 4:00pm

## 2024-08-29 NOTE — Telephone Encounter (Signed)
 Review of chart revealed pt cancelled coumadin  clinic apt for tomorrow at [facility] and [facility] for 12/30.   Will f/u with pt concerning cancellation.

## 2024-08-30 ENCOUNTER — Other Ambulatory Visit: Payer: Self-pay | Admitting: Cardiology

## 2024-08-30 ENCOUNTER — Ambulatory Visit

## 2024-09-10 ENCOUNTER — Telehealth: Payer: Self-pay | Admitting: Family Medicine

## 2024-09-10 NOTE — Telephone Encounter (Signed)
 Copied from CRM [id number]. Topic: General - Other >> Sep 10, 2024  1:56 PM Darshell M wrote: Reason for CRM: Need to reschedule coumadin  clinic appointment for 09/11/2024. Patient CB# [phone]

## 2024-09-10 NOTE — Telephone Encounter (Signed)
 LVM to return call.

## 2024-09-11 ENCOUNTER — Ambulatory Visit

## 2024-09-11 NOTE — Telephone Encounter (Signed)
 Pt returned call. Scheduled for coumadin  clinic for next week per pt request.

## 2024-09-11 NOTE — Telephone Encounter (Signed)
 LVM for pt to return call.   Cancelled apt for today.

## 2024-09-11 NOTE — Telephone Encounter (Signed)
 LVM

## 2024-09-13 ENCOUNTER — Other Ambulatory Visit: Payer: Self-pay | Admitting: Family Medicine

## 2024-09-20 ENCOUNTER — Ambulatory Visit

## 2024-09-20 DIAGNOSIS — Z7901 Long term (current) use of anticoagulants: Secondary | ICD-10-CM

## 2024-09-20 LAB — POCT INR: INR: 2.3 (ref 2.0–3.0)

## 2024-09-20 NOTE — Progress Notes (Signed)
 Knee surgery has been rescheduled for Feb. Pt will need a Lovenox  bridge.  Increase dose today to take 1 tablet and then continue 1 tablet daily except take 1/2 tablet on Monday and Thursday. Recheck in 2 weeks.

## 2024-09-20 NOTE — Patient Instructions (Addendum)
 Pre visit review using our clinic review tool, if applicable. No additional management support is needed unless otherwise documented below in the visit note.  Increase dose today to take 1 tablet and then continue 1 tablet daily except take 1/2 tablet on Monday and Thursday. Recheck in 2 weeks.

## 2024-10-04 ENCOUNTER — Ambulatory Visit

## 2024-10-05 ENCOUNTER — Ambulatory Visit

## 2024-10-05 ENCOUNTER — Encounter: Payer: Self-pay | Admitting: Family Medicine

## 2024-10-05 DIAGNOSIS — Z7901 Long term (current) use of anticoagulants: Secondary | ICD-10-CM

## 2024-10-05 LAB — POCT INR: INR: 2.2 (ref 2.0–3.0)

## 2024-10-05 MED ORDER — WARFARIN SODIUM 10 MG PO TABS: ORAL_TABLET | ORAL | 1 refills | Status: AC

## 2024-10-05 NOTE — Patient Instructions (Addendum)
 Pre visit review using our clinic review tool, if applicable. No additional management support is needed unless otherwise documented below in the visit note.  Increase dose today to take 1 1/2 tablet and then change weekly dose to take 1 tablet daily except take 1/2 tablet on Thursday. Recheck in 3 weeks.

## 2024-10-05 NOTE — Progress Notes (Signed)
 [place]madin  clinic apt unless needed before he can establish with a new provider. Pt was advised when INR should be rechecked. Pt requested refills sent to new pharmacy at the [facility]. Sent in refill of warfarin. He sent a mychart msg to PCP requesting refills of other medications.  Advised if any problems feel free to contact the coumadin  clinic. Pt verbalized understanding and was appreciative of the help. Increase dose today to take 1 1/2 tablet and then change weekly dose to take 1 tablet daily except take 1/2 tablet on Thursday. Recheck in 3 weeks.

## 2024-10-14 ENCOUNTER — Other Ambulatory Visit: Payer: Self-pay | Admitting: Family Medicine

## 2024-10-14 DIAGNOSIS — Z7901 Long term (current) use of anticoagulants: Secondary | ICD-10-CM
